# Patient Record
Sex: Female | Born: 1945
Health system: Southern US, Community
[De-identification: ages and names within clinical notes are randomized; demographics above are authoritative.]

## PROBLEM LIST (undated history)

## (undated) ENCOUNTER — Emergency Department (HOSPITAL_COMMUNITY): Admission: EM | Payer: 59

## (undated) DIAGNOSIS — A388 Scarlet fever with other complications: Secondary | ICD-10-CM

## (undated) DIAGNOSIS — C3432 Malignant neoplasm of lower lobe, left bronchus or lung: Secondary | ICD-10-CM

## (undated) DIAGNOSIS — R011 Cardiac murmur, unspecified: Secondary | ICD-10-CM

## (undated) DIAGNOSIS — I10 Essential (primary) hypertension: Secondary | ICD-10-CM

## (undated) DIAGNOSIS — F419 Anxiety disorder, unspecified: Secondary | ICD-10-CM

## (undated) HISTORY — DX: Essential (primary) hypertension: I10

## (undated) HISTORY — PX: KYPHOPLASTY: SHX5884

## (undated) HISTORY — PX: OTHER SURGICAL HISTORY: SHX169

## (undated) HISTORY — DX: Scarlet fever with other complications: A38.8

## (undated) HISTORY — DX: Anxiety disorder, unspecified: F41.9

## (undated) MED FILL — Dexamethasone Sodium Phosphate Inj 100 MG/10ML: INTRAMUSCULAR | Qty: 1 | Status: AC

---

## 1998-09-05 ENCOUNTER — Other Ambulatory Visit: Admission: RE | Admit: 1998-09-05 | Discharge: 1998-09-05 | Payer: Self-pay | Admitting: Internal Medicine

## 1999-10-23 ENCOUNTER — Other Ambulatory Visit: Admission: RE | Admit: 1999-10-23 | Discharge: 1999-10-23 | Payer: Self-pay | Admitting: Internal Medicine

## 2010-03-06 ENCOUNTER — Encounter: Payer: Self-pay | Admitting: Internal Medicine

## 2010-03-07 ENCOUNTER — Ambulatory Visit: Admit: 2010-03-07 | Payer: Self-pay | Admitting: Internal Medicine

## 2010-03-21 ENCOUNTER — Encounter: Payer: Self-pay | Admitting: Internal Medicine

## 2010-03-21 ENCOUNTER — Ambulatory Visit
Admission: RE | Admit: 2010-03-21 | Discharge: 2010-03-21 | Payer: Self-pay | Source: Home / Self Care | Attending: Internal Medicine | Admitting: Internal Medicine

## 2010-03-21 ENCOUNTER — Other Ambulatory Visit: Payer: Self-pay | Admitting: Internal Medicine

## 2010-03-21 DIAGNOSIS — R739 Hyperglycemia, unspecified: Secondary | ICD-10-CM | POA: Insufficient documentation

## 2010-03-21 DIAGNOSIS — M810 Age-related osteoporosis without current pathological fracture: Secondary | ICD-10-CM | POA: Insufficient documentation

## 2010-03-21 DIAGNOSIS — H538 Other visual disturbances: Secondary | ICD-10-CM | POA: Insufficient documentation

## 2010-03-21 DIAGNOSIS — M549 Dorsalgia, unspecified: Secondary | ICD-10-CM | POA: Insufficient documentation

## 2010-03-21 DIAGNOSIS — F411 Generalized anxiety disorder: Secondary | ICD-10-CM | POA: Insufficient documentation

## 2010-03-21 DIAGNOSIS — R011 Cardiac murmur, unspecified: Secondary | ICD-10-CM | POA: Insufficient documentation

## 2010-03-21 LAB — BASIC METABOLIC PANEL
BUN: 6 mg/dL (ref 6–23)
CO2: 29 mEq/L (ref 19–32)
Calcium: 10.1 mg/dL (ref 8.4–10.5)
Chloride: 100 mEq/L (ref 96–112)
Creatinine, Ser: 0.8 mg/dL (ref 0.4–1.2)
GFR: 75.98 mL/min (ref >60.00)
Glucose, Bld: 99 mg/dL (ref 70–99)
Potassium: 4.9 mEq/L (ref 3.5–5.1)
Sodium: 138 mEq/L (ref 135–145)

## 2010-03-21 LAB — HEMOGLOBIN A1C: Hgb A1c MFr Bld: 6 % (ref 4.6–6.5)

## 2010-03-25 ENCOUNTER — Telehealth: Payer: Self-pay | Admitting: Internal Medicine

## 2010-03-31 ENCOUNTER — Other Ambulatory Visit: Payer: Self-pay | Admitting: Internal Medicine

## 2010-03-31 ENCOUNTER — Ambulatory Visit
Admission: RE | Admit: 2010-03-31 | Discharge: 2010-03-31 | Payer: Self-pay | Source: Home / Self Care | Attending: Internal Medicine | Admitting: Internal Medicine

## 2010-03-31 LAB — TSH: TSH: 0.78 u[IU]/mL (ref 0.35–5.50)

## 2010-04-03 NOTE — Letter (Signed)
Summary: [facility]   Imported By: Sherian Rein 03/27/2010 11:34:32  _____________________________________________________________________  External Attachment:    Type:   Image     Comment:   External Document

## 2010-04-03 NOTE — Progress Notes (Signed)
Summary: req lab results  Phone Note Call from Patient Call back at Home Phone [phone]   Caller: Patient---[phone] Call For: Jacques Navy MD Summary of Call: Pt had labs Jan 20, pt anxious for results. Please advise.  Follow-up for Phone Call        PT CALLED. Bmet and A1c normal. Follow-up by: Jacques Navy MD,  March 25, 2010 5:36 PM

## 2010-04-03 NOTE — Assessment & Plan Note (Signed)
Summary: NEW PT/ SELF PAY/ BRING $184.00/NWS   Vital Signs:  Patient profile:   65 year old female Height:      61 inches Weight:      100 pounds BMI:     18.96 O2 Sat:      99 % on Room air Temp:     97.8 degrees F oral Pulse rate:   128 / minute BP sitting:   154 / 80  (left arm) Cuff size:   regular  Vitals Entered By: Bill Salinas CMA (March 21, 2010 1:38 PM)  O2 Flow:  Room air CC: New pt here to est care with primary md Comments Pt is due for mammogram and has never had a colonoscopy  Pt given rx for hydrocodon 5/325 1 q 6 as needed #30. Pharm called this med was backordered and not avail at any other locations. Gave verbal ok for 10/650 1/2 tab every 6 hours as needed pain #15 3 refills - EMR UPDATED............Marland KitchenLamar Sprinkles, CMA  March 21, 2010 4:07 PM    CC:  New pt here to est care with primary md.  History of Present Illness: Sheena [name] presents to establish for on-going continuity care.  About a week ago she started to develop severe low back pain with a pulling at the low back. the pain was so bad that she could sleep. She did go to Urgent Care - where back films read out possible compression fracture and lab revealed hematuria raising the question of kidney stone. She has a h/o osteoporosis. Since the onset of back pain she has had numbness in the left hip.  She denies dysuria, but has pressure and increased frequency.   She has a chronic sensaton of being dizzy - light-headed. This has been going on for many weeks. She has dry mouth, thirsty all the time, lips have been red and peeling. She hasn't had a doctor's vist or any lab work for a long time.   She c/o varicosity left popliteal fossa but also a feeling of a heavy, like fluid in the ankle.   Preventive Screening-Counseling & Management  Alcohol-Tobacco     Alcohol drinks/day: 0     Packs/Day: 1.0     Year Started: 1970s  Caffeine-Diet-Exercise     Caffeine use/day: 2 cups per day     Does  Patient Exercise: no  Hep-HIV-STD-Contraception     Dental Visit-last 6 months no     Sun Exposure-Excessive: no  Safety-Violence-Falls     Seat Belt Use: yes     Helmet Use: n/a     Firearms in the Home: no firearms in the home     Smoke Detectors: yes     Violence in the Home: no risk noted     Sexual Abuse: no     Fall Risk: slight fall risk      Drug Use:  never.        Blood Transfusions:  no.    Current Medications (verified): 1)  Hydrocodone-Acetaminophen 5-500 Mg Tabs (Hydrocodone-Acetaminophen) .... Take One Tablet Every 4 To 6 Hours As Needed 2)  Cyclobenzaprine Hcl 10 Mg Tabs (Cyclobenzaprine Hcl) .... Take One Tablet By Mouth Three Times A Day As Needed For Muscle Spasms  Allergies (verified): No Known Drug Allergies  Past History:  Past Medical History: UCD BLURRED VISION (ICD-368.8) OSTEOPOROSIS (ICD-733.00) MURMUR (ICD-785.2)  Past Surgical History: Tubal ligation surgical extraction of teeth   G5 P4 1 SAB  Family History: Father- deceased -  didn't know her father Mother- 39: CAD/CABG, breast cancer, GB surg, HTN Neg - DM, colon cancer, CVA  Social History: 10th grade Married '68 - 12 yrs/divorced; married '83 - 3 boys - '69, '73, '84; 1 dtr - '71: 3 grandchildren Homemaker no history of physical or sexual abuse.  Lots of stress: financial stress with poor economy - almost lost home. Youngest son lives at [place].  Packs/Day:  1.0 Caffeine use/day:  2 cups per day Does Patient Exercise:  no Dental Care w/in 6 mos.:  no Sun Exposure-Excessive:  no Seat Belt Use:  yes Fall Risk:  slight fall risk Blood Transfusions:  no Drug Use:  never  Review of Systems       The patient complains of vision loss and decreased hearing.  The patient denies anorexia, fever, weight loss, weight gain, hoarseness, chest pain, syncope, dyspnea on exertion, prolonged cough, headaches, abdominal pain, severe indigestion/heartburn, genital sores, muscle weakness,  difficulty walking, abnormal bleeding, enlarged lymph nodes, and angioedema.         blurred vision - last eye exam more than 5 years. Has tinnitis. Last pelvic/PAP 8 years ago at least. Menopause in late 30's.   Physical Exam  General:  Very thin white woman in no acute distress Head:  normocephalic, atraumatic, and no abnormalities observed.   Eyes:  vision grossly intact, pupils equal, pupils round, corneas and lenses clear, and no injection.   Ears:  External ear exam shows no significant lesions or deformities.  Otoscopic examination reveals clear canals, tympanic membranes are intact bilaterally without bulging, retraction, inflammation or discharge. Hearing is grossly normal bilaterally. Nose:  no external deformity, no external erythema, and no nasal discharge.   Mouth:  no oral lesions. Neck:  supple, no masses, no thyromegaly, and no carotid bruits.   Chest Wall:  no deformities.   Breasts:  No mass, nodules, thickening, tenderness, bulging, retraction, inflamation, nipple discharge or skin changes noted.   Lungs:  normal respiratory effort, normal breath sounds, no crackles, and no wheezes.   Heart:  normal rate, regular rhythm, no murmur, no gallop, and no JVD.   Abdomen:  soft.  BS positive. No guarding or rebound. No Heptosplenomegaly. Msk:  back exam: able to stand, flex, nl gait, able to toe/heel walk. Able to step up to exam with the left leg. Nl DTRs at patellar tendon. Nl sensation to light touch, pin-prick and deep vibratory sensation. Pulses:  2+ radial pulse. Neurologic:  alert & oriented X3 and cranial nerves II-XII intact.   Skin:  turgor normal, color normal, and no suspicious lesions.   Cervical Nodes:  no anterior cervical adenopathy and no posterior cervical adenopathy.   Axillary Nodes:  no R axillary adenopathy and no L axillary adenopathy.   Psych:  Oriented X3, memory intact for recent and remote, normally interactive, and moderately anxious.     Impression  & Recommendations:  Problem # 1:  HYPERGLYCEMIA (ICD-790.29) Patient with excessive glucose in urinalysis at urgent care with many symptoms suggestive of hyperglycemia.  Plan - metabolic panel and A1C.          no sugar low carb diet.  Orders: TLB-BMP (Basic Metabolic Panel-BMET) (80048-METABOL) TLB-A1C / Hgb A1C (Glycohemoglobin) (83036-A1C)  addendum - normal serum glucose and normal A1C  Plan - will need further evaluation: with tachycardia and dry skin , thirst will need to evaluate thyroid function, i.e. TSH  Problem # 2:  OSTEOPOROSIS (ICD-733.00) X-ray of back by report does reveal osteopenic  bones and her frame and appearance put her at high risk. She cannot afford a bone density study at this time.  PLan - start alendoronate weekly            bone density study when possible.  Her updated medication list for this problem includes:    Alendronate Sodium 70 Mg Tabs (Alendronate sodium) .Marland Kitchen... 1 by mouth weekly for osteoporosis  Problem # 3:  MURMUR (ICD-785.2) history of murmur no significant murmur on exam.   Plan - close follow-up on physical exam.  Problem # 4:  ANXIETY (ICD-300.00) Patient has been under significant stress and does appear to be anxious.   Plan - explore further at her next vist and consider low dose anxiolytic  Problem # 5:  BACK PAIN, ACUTE (ICD-724.5) Patient with acute back pain. X-ray was read as negative for compression fracture. Her exam is negative for radiculopathy thus will not pursue additional imaging studies at this time.  Plan - pain mgt with tylenol and muscle relaxant. Analgesia as needed.           back stretches and exercise           repeat exam to insure no radicular problems.  Her updated medication list for this problem includes:    Hydrocodone-acetaminophen 10-650 Mg Tabs (Hydrocodone-acetaminophen) .Marland Kitchen... 1/2 tab every 6 hours as needed pain    Cyclobenzaprine Hcl 10 Mg Tabs (Cyclobenzaprine hcl) .Marland Kitchen... Take one tablet by  mouth three times a day as needed for muscle spasms    Acetaminophen 500 Mg Caps (Acetaminophen) .Marland Kitchen... 1 three times a day for back pain  Problem # 6:  Preventive Health Care (ICD-V70.0) Patient has not had any medical care/preventive care for several years. Her exam today is stable and breast exam is normal. Renal function and electrolytes are normal. She will need to return for pelvic and  PAP smear; a mammogram will need to be done; additional routine lab work is necessary.  She is provided with contact tele # for [facility] so that she can enroll in project access in which I participate so that she can be provided care at deep discount.  In summary - a nice woman who has had back pain and symptoms suggesting a metabolic derangement. She will be contacted in regard to returning for thyroid lab. Hopefully she will enroll in project access and can afford to return to complete wellness care.  Complete Medication List: 1)  Hydrocodone-acetaminophen 10-650 Mg Tabs (Hydrocodone-acetaminophen) .... 1/2 tab every 6 hours as needed pain 2)  Cyclobenzaprine Hcl 10 Mg Tabs (Cyclobenzaprine hcl) .... Take one tablet by mouth three times a day as needed for muscle spasms 3)  Acetaminophen 500 Mg Caps (Acetaminophen) .Marland Kitchen.. 1 three times a day for back pain 4)  Alendronate Sodium 70 Mg Tabs (Alendronate sodium) .Marland Kitchen.. 1 by mouth weekly for osteoporosis  Patient: Sheena [name] Note: All result statuses are Final unless otherwise noted.  Tests: (1) BMP (METABOL)   Sodium                    138 mEq/L                   135-145   Potassium                 4.9 mEq/L                   3.5-5.1   Chloride  100 mEq/L                   96-112   Carbon Dioxide            29 mEq/L                    19-32   Glucose                   99 mg/dL                    16-10   BUN                       6 mg/dL                     9-60   Creatinine                0.8 mg/dL                   4.5-4.0   Calcium                    10.1 mg/dL                  9.8-11.9   GFR                       75.98 mL/min                >60.00  Tests: (2) Hemoglobin A1C (A1C)   Hemoglobin A1C            6.0 %                       4.6-6.5     Glycemic Control Guidelines for People with Diabetes:     Non Diabetic:  <6%  Patient Instructions: 1)  Back pain - no evidence of a pinched nerve: no need for any imaging studies. Take generic aleve (naproxen sodium) 2 tablets AM and PM.Take tyleno (generic)l 500mg  three times a day on schedule. It is ok to take the vicodin 5/500 but remember that it contains tylenol and you can not take more the 3000mg  of tylenol a day. Heat will help. Start doing some gentle stretching exercises. 2)  Diabetes- I am concerned that you have diabetes based on your symptoms and the  suguar in your urine. Plan - lab today. You need to be on a NO sugar diet and low carbs (cut down on bread, rice, potatoes). when we have a better diagnosis will talk about medication. 3)  Bone health - you have osteoporosis. Plan - generic alendronate 70mg  once a week.  4)  Health maintenance - you will need a pelvic and pap smear.  5)  Financial assistance for health care - [facility] [phone]. Call about being enrolled in project access for low to no cost care. Prescriptions: HYDROCODONE-ACETAMINOPHEN 10-650 MG TABS (HYDROCODONE-ACETAMINOPHEN) 1/2 tab every 6 hours as needed pain  #15 x 3   Entered by:   Lamar Sprinkles, CMA   Authorized by:   Jacques Navy MD   Signed by:   Lamar Sprinkles, CMA on 03/21/2010   Method used:   Telephoned to ...         RxID:   [id number] ALENDRONATE SODIUM 70 MG TABS (ALENDRONATE SODIUM) 1 by mouth weekly for  osteoporosis  #4 x 12   Entered and Authorized by:   Jacques Navy MD   Signed by:   Jacques Navy MD on 03/21/2010   Method used:   Print then Give to Patient   RxID:   [id number]    Orders Added: 1)  TLB-BMP (Basic Metabolic Panel-BMET) [80048-METABOL] 2)  TLB-A1C  / Hgb A1C (Glycohemoglobin) [83036-A1C] 3)  New Patient Level II [99202]   Immunization History:  Influenza Immunization History:    Influenza:  declined (03/21/2010)   Immunization History:  Influenza Immunization History:    Influenza:  declined (03/21/2010)

## 2010-04-07 ENCOUNTER — Encounter: Payer: Self-pay | Admitting: Internal Medicine

## 2010-04-17 NOTE — Letter (Signed)
   [facility] [address] [place], [place]  [address] Phone: [phone]      April 07, 2010   [facility] [address] [place], [place] [address]  RE:  LAB RESULTS  Dear  Ms. Arroyo,  The following is an interpretation of your most recent lab tests.  Please take note of any instructions provided or changes to medications that have resulted from your lab work.  THYROID STUDIES:  Thyroid studies normal TSH: 0.78      No laboratory explanation for your symptoms. We can discuss further evaluation at your next visit.   I hope that you have been able to sign up with [employer].    Sincerely Yours,    Jacques Navy MD

## 2010-04-22 ENCOUNTER — Telehealth: Payer: Self-pay | Admitting: Internal Medicine

## 2010-04-29 NOTE — Progress Notes (Signed)
Summary: PAIN  Phone Note Call from Patient Call back at Home Phone [phone]   Summary of Call: Patient is requesting a call back regarding her back pain. Only taking tylenol q 6 hrs and wants to talk to nurse.  Initial call taken by: Lamar Sprinkles, CMA,  April 22, 2010 12:57 PM  Follow-up for Phone Call        Spoke w/pt - she stopped aleve b/c it caused stomach pain. She is not taking hydrocodone b/c she does not like how it makes her feel. Has also had muscle relaxer in the past - unsure how it affects her b/c she was taking her at the same time has hydrocodone. She thinks the flexeril did help some w/the "pulling" feeling. Patient is requesting rx for "mild" rx at night that does not make her feel "sick" like hydrocodone. She is taking 500mg  tylenol every 6 hours. This helps some during the day but her worse pain is at night and she is having problems sleeping.  Follow-up by: Lamar Sprinkles, CMA,  April 22, 2010 6:03 PM  Additional Follow-up for Phone Call Additional follow up Details #1::        can try tramadol 50mg  1 or 2 every 8 hours as needed for pain. #60, refill x 3 Additional Follow-up by: Jacques Navy MD,  April 22, 2010 6:20 PM    Additional Follow-up for Phone Call Additional follow up Details #2::    Pt informed  Follow-up by: Lamar Sprinkles, CMA,  April 23, 2010 12:21 PM  New/Updated Medications: TRAMADOL HCL 50 MG TABS (TRAMADOL HCL) 1 or 2 every 8 hrs for pain Prescriptions: TRAMADOL HCL 50 MG TABS (TRAMADOL HCL) 1 or 2 every 8 hrs for pain  #60 x 3   Entered by:   Lamar Sprinkles, CMA   Authorized by:   Jacques Navy MD   Signed by:   Lamar Sprinkles, CMA on 04/23/2010   Method used:   Electronically to        [facility]  [address] [phone]* (retail)       [address]       [place], [place]  [address]       Ph: [phone]       Fax: [phone]   RxID:   [id number]

## 2010-10-13 ENCOUNTER — Emergency Department (HOSPITAL_COMMUNITY): Payer: Self-pay

## 2010-10-13 ENCOUNTER — Emergency Department (HOSPITAL_COMMUNITY)
Admission: EM | Admit: 2010-10-13 | Discharge: 2010-10-13 | Disposition: A | Discharge status: Home / Self Care | Payer: Self-pay | Attending: Emergency Medicine | Admitting: Emergency Medicine

## 2010-10-13 DIAGNOSIS — M549 Dorsalgia, unspecified: Secondary | ICD-10-CM | POA: Insufficient documentation

## 2010-10-13 DIAGNOSIS — H9319 Tinnitus, unspecified ear: Secondary | ICD-10-CM | POA: Insufficient documentation

## 2010-10-13 DIAGNOSIS — R5381 Other malaise: Secondary | ICD-10-CM | POA: Insufficient documentation

## 2010-10-13 DIAGNOSIS — R002 Palpitations: Secondary | ICD-10-CM | POA: Insufficient documentation

## 2010-10-13 DIAGNOSIS — R5383 Other fatigue: Secondary | ICD-10-CM | POA: Insufficient documentation

## 2010-10-13 DIAGNOSIS — I498 Other specified cardiac arrhythmias: Secondary | ICD-10-CM | POA: Insufficient documentation

## 2010-10-13 DIAGNOSIS — R42 Dizziness and giddiness: Secondary | ICD-10-CM | POA: Insufficient documentation

## 2010-10-13 LAB — PROTIME-INR: Prothrombin Time: 13 seconds (ref 11.6–15.2)

## 2010-10-13 LAB — URINALYSIS, ROUTINE W REFLEX MICROSCOPIC
Bilirubin Urine: NEGATIVE
Glucose, UA: NEGATIVE mg/dL
Ketones, ur: NEGATIVE mg/dL
Nitrite: NEGATIVE
Specific Gravity, Urine: 1.003 — ABNORMAL LOW (ref 1.005–1.030)
pH: 7 (ref 5.0–8.0)

## 2010-10-13 LAB — DIFFERENTIAL
Basophils Absolute: 0.1 10*3/uL (ref 0.0–0.1)
Eosinophils Relative: 0 % (ref 0–5)
Lymphocytes Relative: 15 % (ref 12–46)
Lymphs Abs: 1.9 10*3/uL (ref 0.7–4.0)
Neutrophils Relative %: 79 % — ABNORMAL HIGH (ref 43–77)

## 2010-10-13 LAB — URINE MICROSCOPIC-ADD ON

## 2010-10-13 LAB — CBC
HCT: 36.7 % (ref 36.0–46.0)
MCV: 96.1 fL (ref 78.0–100.0)
Platelets: 378 10*3/uL (ref 150–400)
RBC: 3.82 MIL/uL — ABNORMAL LOW (ref 3.87–5.11)
RDW: 13.6 % (ref 11.5–15.5)
WBC: 12.1 10*3/uL — ABNORMAL HIGH (ref 4.0–10.5)

## 2010-10-13 LAB — POCT I-STAT, CHEM 8
BUN: 4 mg/dL — ABNORMAL LOW (ref 6–23)
Chloride: 107 mEq/L (ref 96–112)
HCT: 40 % (ref 36.0–46.0)
Potassium: 4.6 mEq/L (ref 3.5–5.1)

## 2010-10-13 LAB — COMPREHENSIVE METABOLIC PANEL
ALT: 7 U/L (ref 0–35)
AST: 15 U/L (ref 0–37)
Albumin: 3.9 g/dL (ref 3.5–5.2)
Calcium: 9.1 mg/dL (ref 8.4–10.5)
Creatinine, Ser: 0.58 mg/dL (ref 0.50–1.10)
Sodium: 143 mEq/L (ref 135–145)
Total Protein: 7 g/dL (ref 6.0–8.3)

## 2010-10-13 LAB — POCT I-STAT TROPONIN I: Troponin i, poc: 0.03 ng/mL (ref 0.00–0.08)

## 2010-10-24 ENCOUNTER — Telehealth: Payer: Self-pay | Admitting: *Deleted

## 2010-10-24 NOTE — Telephone Encounter (Signed)
Patient informed. 

## 2010-10-24 NOTE — Telephone Encounter (Signed)
Seen as a new patient in January. For cold symptms: APAP, nasal saline, may want to dry low dose sudafed at 30mg  to see if it helps w/o too much side effect. Alternatively she can use afrin nasal spray.  For worse illness, i.e. Need for antibiotics, can refer to Saturday clinic

## 2010-10-24 NOTE — Telephone Encounter (Signed)
Pt c/o nausea, sinus congestion/pressure & some drainage, productive cough w/green mucus. She is req RX for antibiotic. Per pt, she can not take antihistamines or sudafed b/c it causes "leg to jump" and heart racing.

## 2011-03-24 ENCOUNTER — Telehealth: Payer: Self-pay | Admitting: Internal Medicine

## 2011-03-24 NOTE — Telephone Encounter (Signed)
This patient called and is hoping to get an appt with you, however, she has no insurance and no money.  She states she will not be able to pay any of the 125.00 no insurance copay.  She is having headaches and has been dizzy for two months.  She also wants to discuss how she can get on disability.  Do you want me to go ahead and make her an appt?    Thanks!

## 2011-03-24 NOTE — Telephone Encounter (Signed)
Seen 1 year ago. She was told to enroll in project access/[facility] [phone] to enroll. They will say they have no room for more patients at [facility] but I am a participating physician.  She may be scheduled for an appointment

## 2011-03-28 ENCOUNTER — Emergency Department (HOSPITAL_COMMUNITY): Payer: Self-pay

## 2011-03-28 ENCOUNTER — Emergency Department (HOSPITAL_COMMUNITY): Admission: EM | Admit: 2011-03-28 | Discharge: 2011-03-28 | Payer: Self-pay | Source: Home / Self Care

## 2011-03-28 ENCOUNTER — Encounter (HOSPITAL_COMMUNITY): Payer: Self-pay | Admitting: *Deleted

## 2011-03-28 ENCOUNTER — Emergency Department (HOSPITAL_COMMUNITY)
Admission: EM | Admit: 2011-03-28 | Discharge: 2011-03-28 | Disposition: A | Discharge status: Home / Self Care | Payer: Self-pay | Attending: Emergency Medicine | Admitting: Emergency Medicine

## 2011-03-28 DIAGNOSIS — R630 Anorexia: Secondary | ICD-10-CM | POA: Insufficient documentation

## 2011-03-28 DIAGNOSIS — R5383 Other fatigue: Secondary | ICD-10-CM | POA: Insufficient documentation

## 2011-03-28 DIAGNOSIS — J329 Chronic sinusitis, unspecified: Secondary | ICD-10-CM

## 2011-03-28 DIAGNOSIS — R51 Headache: Secondary | ICD-10-CM | POA: Insufficient documentation

## 2011-03-28 DIAGNOSIS — R5381 Other malaise: Secondary | ICD-10-CM | POA: Insufficient documentation

## 2011-03-28 DIAGNOSIS — I10 Essential (primary) hypertension: Secondary | ICD-10-CM

## 2011-03-28 DIAGNOSIS — R42 Dizziness and giddiness: Secondary | ICD-10-CM | POA: Insufficient documentation

## 2011-03-28 HISTORY — DX: Cardiac murmur, unspecified: R01.1

## 2011-03-28 LAB — POCT I-STAT, CHEM 8
BUN: <3 mg/dL — ABNORMAL LOW (ref 6–23)
Chloride: 99 mEq/L (ref 96–112)
Creatinine, Ser: 0.6 mg/dL (ref 0.50–1.10)
Glucose, Bld: 112 mg/dL — ABNORMAL HIGH (ref 70–99)
HCT: 41 % (ref 36.0–46.0)
Potassium: 4.5 mEq/L (ref 3.5–5.1)

## 2011-03-28 MED ORDER — CEPHALEXIN 500 MG PO CAPS: 500.0000 mg | ORAL_CAPSULE | Freq: Four times a day (QID) | ORAL | Status: DC

## 2011-03-28 MED ORDER — METOPROLOL TARTRATE 50 MG PO TABS: 50.0000 mg | ORAL_TABLET | Freq: Two times a day (BID) | ORAL | Status: DC

## 2011-03-28 MED ORDER — METOPROLOL SUCCINATE ER 25 MG PO TB24: 25.0000 mg | ORAL_TABLET | Freq: Every day | ORAL | Status: DC

## 2011-03-28 MED ORDER — TRAMADOL HCL 50 MG PO TABS: 50.0000 mg | ORAL_TABLET | Freq: Four times a day (QID) | ORAL | Status: DC | PRN

## 2011-03-28 MED ORDER — AMOXICILLIN-POT CLAVULANATE 500-125 MG PO TABS: 1.0000 | ORAL_TABLET | Freq: Three times a day (TID) | ORAL | Status: AC

## 2011-03-28 NOTE — ED Notes (Signed)
Patient with c/o dizziness and headache for about a year and today her symptoms were worse.  Patient is now off balance when she stands up.  Patient states that when she gets the headache it hurts so bad that she feels like she is going to fall.  Patient feels weak all over.  Patient also complain of loss of appetite.

## 2011-03-28 NOTE — ED Provider Notes (Addendum)
History     CSN: [id number]  Arrival date & time 03/28/11  1624   First MD Initiated Contact with Patient 03/28/11 1749      Chief Complaint  Patient presents with  . Dizziness  . Headache     HPI Patient with c/o dizziness and headache for about a year and today her symptoms were worse. Patient is now off balance when she stands up. Patient states that when she gets the headache it hurts so bad that she feels like she is going to fall. Patient feels weak all over. Patient also complain of loss of appetite.   Past Medical History  Diagnosis Date  . Heart murmur     History reviewed. No pertinent past surgical history.  History reviewed. No pertinent family history.  History  Substance Use Topics  . Smoking status: Never Smoker   . Smokeless tobacco: Not on file  . Alcohol Use: No    OB History    Grav Para Term Preterm Abortions TAB SAB Ect Mult Living                  Review of Systems Negative except as noted in history of present illness Allergies  Aspirin  Home Medications   Current Outpatient Rx  Name Route Sig Dispense Refill  . ACETAMINOPHEN 500 MG PO TABS Oral Take 500 mg by mouth 2 (two) times daily as needed. For pain    . AMOXICILLIN-POT CLAVULANATE 500-125 MG PO TABS Oral Take 1 tablet (500 mg total) by mouth every 8 (eight) hours. 21 tablet 0  . CEPHALEXIN 500 MG PO CAPS Oral Take 1 capsule (500 mg total) by mouth 4 (four) times daily. 30 capsule 0  . METOPROLOL TARTRATE 50 MG PO TABS Oral Take 1 tablet (50 mg total) by mouth 2 (two) times daily. 60 tablet 2  . TRAMADOL HCL 50 MG PO TABS Oral Take 1 tablet (50 mg total) by mouth every 6 (six) hours as needed for pain. 15 tablet 0    BP 171/78  Pulse 102  Temp(Src) 98.1 F (36.7 C) (Oral)  Resp 20  SpO2 98%  Physical Exam  Nursing note and vitals reviewed. Constitutional: She is oriented to person, place, and time. She appears well-developed and well-nourished. No distress.  HENT:  Head:  Normocephalic and atraumatic.  Eyes: Pupils are equal, round, and reactive to light.  Neck: Normal range of motion.  Cardiovascular: Normal rate and intact distal pulses.   Pulmonary/Chest: No respiratory distress. She has no wheezes.  Abdominal: Normal appearance. She exhibits no distension. There is no tenderness.  Musculoskeletal: Normal range of motion.  Neurological: She is alert and oriented to person, place, and time. She has normal strength. No cranial nerve deficit or sensory deficit. She displays a negative Romberg sign. GCS eye subscore is 4. GCS verbal subscore is 5. GCS motor subscore is 6.  Skin: Skin is warm and dry. No rash noted.  Psychiatric: She has a normal mood and affect. Her behavior is normal.    ED Course  Procedures (including critical care time)  Labs Reviewed  POCT I-STAT, CHEM 8 - Abnormal; Notable for the following:    BUN <3 (*)    Glucose, Bld 112 (*)    All other components within normal limits  LAB REPORT - SCANNED   No results found.   1. Sinusitis   2. Hypertension       MDM  Nelia Shi, MD 03/30/11 2320  Nelia Shi, MD 03/30/11 [phone]

## 2011-03-28 NOTE — ED Notes (Addendum)
Asked by registration staff to evaluate patient in lobby.  Patient has multiple complaints.  Pts greatest concern is her sinus congestion and dizziness.  Patient states this has been going on for a year and she has not been able to afford to go to the ENT.  Patient shaking and states she is very weak.  Patient's husband with her.  Wheelchair obtained for patient.  Patient made aware of current wait time and that we did not have any rooms in which she could lay down.  Patient stated her head hurt too bad to have to sit in waiting room.  Patient and husband decided not to stay and be seen at [facility].

## 2011-04-01 ENCOUNTER — Ambulatory Visit (INDEPENDENT_AMBULATORY_CARE_PROVIDER_SITE_OTHER): Payer: Self-pay | Admitting: Internal Medicine

## 2011-04-01 ENCOUNTER — Ambulatory Visit: Payer: Self-pay | Admitting: Internal Medicine

## 2011-04-01 ENCOUNTER — Encounter: Payer: Self-pay | Admitting: Internal Medicine

## 2011-04-01 ENCOUNTER — Ambulatory Visit (INDEPENDENT_AMBULATORY_CARE_PROVIDER_SITE_OTHER)
Admission: RE | Admit: 2011-04-01 | Discharge: 2011-04-01 | Disposition: A | Discharge status: Home / Self Care | Payer: Self-pay | Source: Ambulatory Visit | Attending: Internal Medicine | Admitting: Internal Medicine

## 2011-04-01 ENCOUNTER — Other Ambulatory Visit (INDEPENDENT_AMBULATORY_CARE_PROVIDER_SITE_OTHER): Payer: Self-pay

## 2011-04-01 VITALS — BP 140/80 | HR 62 | Temp 97.6°F | Resp 16

## 2011-04-01 DIAGNOSIS — R05 Cough: Secondary | ICD-10-CM

## 2011-04-01 DIAGNOSIS — R7309 Other abnormal glucose: Secondary | ICD-10-CM

## 2011-04-01 DIAGNOSIS — J411 Mucopurulent chronic bronchitis: Secondary | ICD-10-CM | POA: Insufficient documentation

## 2011-04-01 DIAGNOSIS — R270 Ataxia, unspecified: Secondary | ICD-10-CM | POA: Insufficient documentation

## 2011-04-01 DIAGNOSIS — H538 Other visual disturbances: Secondary | ICD-10-CM

## 2011-04-01 DIAGNOSIS — R279 Unspecified lack of coordination: Secondary | ICD-10-CM

## 2011-04-01 DIAGNOSIS — F419 Anxiety disorder, unspecified: Secondary | ICD-10-CM

## 2011-04-01 DIAGNOSIS — F411 Generalized anxiety disorder: Secondary | ICD-10-CM

## 2011-04-01 LAB — CBC WITH DIFFERENTIAL/PLATELET
Basophils Absolute: 0.1 10*3/uL (ref 0.0–0.1)
HCT: 39.8 % (ref 36.0–46.0)
Hemoglobin: 13.6 g/dL (ref 12.0–15.0)
Lymphs Abs: 1.8 10*3/uL (ref 0.7–4.0)
MCHC: 34.1 g/dL (ref 30.0–36.0)
MCV: 96.5 fl (ref 78.0–100.0)
Monocytes Absolute: 0.5 10*3/uL (ref 0.1–1.0)
Monocytes Relative: 4.4 % (ref 3.0–12.0)
Neutro Abs: 8.8 10*3/uL — ABNORMAL HIGH (ref 1.4–7.7)
RDW: 13.2 % (ref 11.5–14.6)

## 2011-04-01 LAB — COMPREHENSIVE METABOLIC PANEL
AST: 18 U/L (ref 0–37)
BUN: 6 mg/dL (ref 6–23)
Calcium: 9.7 mg/dL (ref 8.4–10.5)
Chloride: 101 mEq/L (ref 96–112)
Creatinine, Ser: 0.7 mg/dL (ref 0.4–1.2)
GFR: 92.67 mL/min (ref >60.00)

## 2011-04-01 LAB — VITAMIN B12: Vitamin B-12: 322 pg/mL (ref 211–911)

## 2011-04-01 LAB — FOLATE: Folate: 6.9 ng/mL (ref >5.9)

## 2011-04-01 MED ORDER — DIAZEPAM 5 MG PO TABS: 5.0000 mg | ORAL_TABLET | Freq: Three times a day (TID) | ORAL | Status: AC | PRN

## 2011-04-01 NOTE — Assessment & Plan Note (Signed)
She will try valium for symptom relief

## 2011-04-01 NOTE — Progress Notes (Signed)
Subjective:    Patient ID: Sheena [name], female    DOB: 12-16-1945, 66 y.o.   MRN: [id number]  Cough This is a chronic problem. The current episode started more than 1 year ago. The problem has been gradually worsening. The problem occurs every few hours. The cough is productive of purulent sputum. Pertinent negatives include no chest pain, chills, ear congestion, ear pain, eye redness, headaches, heartburn, hemoptysis, myalgias, nasal congestion, postnasal drip, rash, rhinorrhea, sore throat, shortness of breath, sweats, weight loss or wheezing. Risk factors for lung disease include smoking/tobacco exposure. She has tried nothing for the symptoms.  Also, she complains of worsening ataxia, dizziness, diffuse weakness, and vertigo for about 6 months.    Review of Systems  Constitutional: Negative for chills, weight loss, diaphoresis, activity change, appetite change and fatigue.  HENT: Positive for tinnitus. Negative for hearing loss, ear pain, nosebleeds, congestion, sore throat, facial swelling, rhinorrhea, sneezing, trouble swallowing, neck pain, neck stiffness, voice change, postnasal drip and sinus pressure.   Eyes: Positive for visual disturbance (blurred vision). Negative for photophobia, pain, discharge, redness and itching.  Respiratory: Positive for cough. Negative for hemoptysis, choking, chest tightness, shortness of breath and wheezing.   Cardiovascular: Negative for chest pain, palpitations and leg swelling.  Gastrointestinal: Negative for heartburn, nausea, vomiting, abdominal pain, diarrhea, constipation, blood in stool, abdominal distention, anal bleeding and rectal pain.  Musculoskeletal: Negative for myalgias, back pain, joint swelling, arthralgias and gait problem.  Skin: Negative for color change, pallor, rash and wound.  Neurological: Positive for dizziness, weakness ("all over") and light-headedness. Negative for tremors, seizures, syncope, facial asymmetry, speech  difficulty, numbness and headaches.  Hematological: Negative for adenopathy. Does not bruise/bleed easily.  Psychiatric/Behavioral: Negative for suicidal ideas, hallucinations, confusion, sleep disturbance, self-injury, dysphoric mood, decreased concentration and agitation. The patient is nervous/anxious. The patient is not hyperactive.        Objective:   Physical Exam  Vitals reviewed. Constitutional: She is oriented to person, place, and time. She appears well-developed and well-nourished. No distress.  HENT:  Head: Normocephalic and atraumatic.  Mouth/Throat: Oropharynx is clear and moist. No oropharyngeal exudate.  Eyes: Conjunctivae and EOM are normal. Pupils are equal, round, and reactive to light. Right eye exhibits no discharge. Left eye exhibits no discharge. No scleral icterus.  Neck: Normal range of motion. Neck supple. No JVD present. No tracheal deviation present. No thyromegaly present.  Cardiovascular: Normal rate, regular rhythm and intact distal pulses.  Exam reveals no gallop and no friction rub.   No murmur heard. Pulmonary/Chest: Effort normal and breath sounds normal. No stridor. No respiratory distress. She has no wheezes. She has no rales. She exhibits no tenderness.  Abdominal: Soft. Bowel sounds are normal. She exhibits no distension and no mass. There is no tenderness. There is no rebound and no guarding.  Musculoskeletal: Normal range of motion. She exhibits no edema and no tenderness.  Lymphadenopathy:    She has no cervical adenopathy.  Neurological: She is alert and oriented to person, place, and time. She has normal strength. She displays abnormal reflex. She displays no atrophy and no tremor. No cranial nerve deficit or sensory deficit. She exhibits normal muscle tone. She displays no seizure activity. Coordination and gait abnormal. She displays no Babinski's sign on the right side. She displays no Babinski's sign on the left side.  Reflex Scores:      Tricep  reflexes are 3+ on the right side and 3+ on the left side.  Bicep reflexes are 3+ on the right side and 3+ on the left side.      Brachioradialis reflexes are 3+ on the right side and 3+ on the left side.      Patellar reflexes are 3+ on the right side and 3+ on the left side.      Achilles reflexes are 2+ on the right side and 2+ on the left side.      Ataxic with an abn rhomberg  Skin: Skin is warm and dry. No rash noted. She is not diaphoretic. No erythema. No pallor.  Psychiatric: Her speech is normal and behavior is normal. Judgment and thought content normal. Her mood appears anxious. Her affect is not angry, not blunt, not labile and not inappropriate. Cognition and memory are normal. She does not exhibit a depressed mood.      Lab Results  Component Value Date   WBC 12.1* 10/13/2010   HGB 13.9 03/28/2011   HCT 41.0 03/28/2011   PLT 378 10/13/2010   GLUCOSE 112* 03/28/2011   ALT 7 10/13/2010   AST 15 10/13/2010   NA 135 03/28/2011   K 4.5 03/28/2011   CL 99 03/28/2011   CREATININE 0.60 03/28/2011   BUN <3* 03/28/2011   CO2 25 10/13/2010   TSH 0.78 03/31/2010   INR 0.96 10/13/2010   HGBA1C 6.0 03/21/2010      Assessment & Plan:

## 2011-04-01 NOTE — Patient Instructions (Signed)
Ataxia You have an unsteady walk called ataxia. Your condition may require further tests. Ataxia can be caused by:  Neurological conditions.   Infections.   Physical exhaustion.   Internal bleeding.   Alcohol intoxication, or medication side effects.   Problems with circulation, blood pressure, and heart disease can also make you unsteady.  Laboratory and X-ray tests may be needed.  Treatment for now:  Get plenty of rest and eat a nutritious diet over the next weeks.   Avoid alcohol.   If you become very unsteady, dizzy, nauseated, or feel like you are going to faint, lie down flat right away.   Wait until all your symptoms pass before you get up again.  SEEK IMMEDIATE MEDICAL CARE IF:  You develop severe unsteadiness, headache, chest pain, or abdominal pain.   You have weakness or numbness on one side of your body.   You have problems with your vision.   You develop confusion or difficulty speaking.   You have a fever, chills, or an irregular heartbeat or a very fast pulse.  MAKE SURE YOU:   Understand these instructions.   Will watch your condition.   Will get help right away if you are not doing well or get worse.  Document Released: 02/16/2005 Document Revised: 10/29/2010 Document Reviewed: 08/05/2006 [facility] Patient Information 2012 [place], [place].

## 2011-04-01 NOTE — Assessment & Plan Note (Signed)
I will check labs to look for secondary causes, she needs an MRI of the brain done, will see if valium helps with the vertigo

## 2011-04-01 NOTE — Assessment & Plan Note (Signed)
I will check her CXR to look for emphysema, pna, edema, mass

## 2011-04-01 NOTE — Assessment & Plan Note (Signed)
She will continue taking the augmentin that was recently prescribed in the ER

## 2011-04-01 NOTE — Assessment & Plan Note (Signed)
She needs to have an MRI done to look for CNS lesions, possibly MS

## 2011-04-01 NOTE — Assessment & Plan Note (Signed)
Will check her a1c today

## 2011-04-06 ENCOUNTER — Ambulatory Visit (HOSPITAL_COMMUNITY)
Admission: RE | Admit: 2011-04-06 | Discharge: 2011-04-06 | Disposition: A | Discharge status: Home / Self Care | Payer: Self-pay | Source: Ambulatory Visit | Attending: Internal Medicine | Admitting: Internal Medicine

## 2011-04-06 DIAGNOSIS — H538 Other visual disturbances: Secondary | ICD-10-CM | POA: Insufficient documentation

## 2011-04-06 DIAGNOSIS — R55 Syncope and collapse: Secondary | ICD-10-CM | POA: Insufficient documentation

## 2011-04-06 DIAGNOSIS — J018 Other acute sinusitis: Secondary | ICD-10-CM | POA: Insufficient documentation

## 2011-04-06 DIAGNOSIS — R05 Cough: Secondary | ICD-10-CM

## 2011-04-06 DIAGNOSIS — R270 Ataxia, unspecified: Secondary | ICD-10-CM

## 2011-04-06 DIAGNOSIS — R7309 Other abnormal glucose: Secondary | ICD-10-CM

## 2011-04-07 ENCOUNTER — Encounter: Payer: Self-pay | Admitting: Internal Medicine

## 2011-04-15 ENCOUNTER — Telehealth: Payer: Self-pay | Admitting: *Deleted

## 2011-04-15 NOTE — Telephone Encounter (Signed)
Reviewed ED notes, ov NOTE, lab, CXR and MRI: appears to be a sinus inflammatory process.  For continued problems with balance will refer to Dr. Modesto Charon for neurology after she has a follow-up office visit with Dr. Debby Bud

## 2011-04-15 NOTE — Telephone Encounter (Signed)
Pt states that she is still having sxs of dizziness, weakness, loss of balance and sometimes have trouble focusing her eyes that she was seen for 2 weeks ago. Pt informed that MRI results were negative by Dr. Yetta Barre. Pt is asking for MD's advisement regarding sxs.

## 2011-04-16 NOTE — Telephone Encounter (Signed)
Pt informed of MD's advisement. (Pt states she will callback next week for OV) She wants to know if there is something else that she can take for her sinuses because they are still bothering her.

## 2011-04-16 NOTE — Telephone Encounter (Signed)
For sinus pressure and discomfort she can try sudafed 30 mg 2 or 3 times a day. Nasal saline wash is helpful. Stove top vaporizer is helpful.

## 2011-06-22 ENCOUNTER — Telehealth: Payer: Self-pay

## 2011-06-22 MED ORDER — METOPROLOL TARTRATE 50 MG PO TABS: 50.0000 mg | ORAL_TABLET | Freq: Two times a day (BID) | ORAL | Status: DC

## 2011-06-22 NOTE — Telephone Encounter (Signed)
Patient called request BP refill

## 2012-06-15 ENCOUNTER — Other Ambulatory Visit: Payer: Self-pay

## 2012-06-15 MED ORDER — METOPROLOL TARTRATE 50 MG PO TABS: 50.0000 mg | ORAL_TABLET | Freq: Two times a day (BID) | ORAL | Status: DC

## 2012-06-15 NOTE — Telephone Encounter (Signed)
Pt called stating that she currently is without insurance and Medicare coverage will not become effective until July. Pt requests refills of BP medications until she can make an appointment. Refills sent, pt transferred to schedule CPE.

## 2012-08-31 ENCOUNTER — Encounter: Payer: Self-pay | Admitting: Internal Medicine

## 2012-08-31 ENCOUNTER — Ambulatory Visit (INDEPENDENT_AMBULATORY_CARE_PROVIDER_SITE_OTHER): Payer: Medicare Other | Admitting: Internal Medicine

## 2012-08-31 VITALS — BP 170/78 | HR 65 | Temp 97.9°F | Ht 60.0 in | Wt 102.4 lb

## 2012-08-31 DIAGNOSIS — J329 Chronic sinusitis, unspecified: Secondary | ICD-10-CM | POA: Diagnosis not present

## 2012-08-31 DIAGNOSIS — R270 Ataxia, unspecified: Secondary | ICD-10-CM

## 2012-08-31 DIAGNOSIS — F411 Generalized anxiety disorder: Secondary | ICD-10-CM

## 2012-08-31 DIAGNOSIS — F419 Anxiety disorder, unspecified: Secondary | ICD-10-CM

## 2012-08-31 DIAGNOSIS — I10 Essential (primary) hypertension: Secondary | ICD-10-CM

## 2012-08-31 DIAGNOSIS — R279 Unspecified lack of coordination: Secondary | ICD-10-CM | POA: Diagnosis not present

## 2012-08-31 MED ORDER — METOPROLOL TARTRATE 50 MG PO TABS: 50.0000 mg | ORAL_TABLET | Freq: Two times a day (BID) | ORAL | Status: DC

## 2012-08-31 MED ORDER — AMOXICILLIN-POT CLAVULANATE 875-125 MG PO TABS: 1.0000 | ORAL_TABLET | Freq: Two times a day (BID) | ORAL | Status: DC

## 2012-08-31 MED ORDER — FUROSEMIDE 20 MG PO TABS: 20.0000 mg | ORAL_TABLET | Freq: Every day | ORAL | Status: DC

## 2012-08-31 NOTE — Progress Notes (Signed)
Subjective:    Patient ID: Sheena [name], female    DOB: 06-29-1945, 67 y.o.   MRN: [id number]  HPI Mrs. Lebeck has not been seen for over 2 years. She reports that she has been ill the entire time. Chart reviewed: CT head Aug '12 - normal brain. CT Jan 26/'13 at ED normal brain but chronic ethmoid sinusitis. Saw Dr. Yetta Barre in follow up Jan 30, '13 for persistent symptoms. F/U MRI head with normal brain but chronic sinusitis.  She reports that she continues to have pressure in her head, dysequilibrium that is so bad she has not been leaving the home. She feels very stressed. She has been taking her BP medication. She was given a trial of meclizine which she said did not help. Dr. Yetta Barre tried Valium - she could not tolerate this.   Past Medical History  Diagnosis Date  . Heart murmur   . Hypertension   . Anxiety   . Scarlet fever with other complications childhood    "had to learn to work again" has had leg weakness   Past Surgical History  Procedure Laterality Date  . Peridontal surgery     Family History  Problem Relation Age of Onset  . Cancer Mother     breast, spine mets  . Hypertension Mother   . Heart disease Mother     CABG 4 vessel  . COPD Father     emphysema  . Heart disease Brother     MI   History   Social History  . Marital Status: Married    Spouse Name: N/A    Number of Children: 4  . Years of Education: 10   Occupational History  . disability    Social History Main Topics  . Smoking status: Current Every Day Smoker -- 1.00 packs/day for 45 years    Types: Cigarettes  . Smokeless tobacco: Never Used  . Alcohol Use: No  . Drug Use: No  . Sexually Active: Not Currently   Other Topics Concern  . Not on file   Social History Narrative   10th grade. Quit school to work and help raise nieces. Married - '68 -2yr/divorced; remarried '82 - . 3 son '69, '73, '84; 1 dtr '71. 3 grandchildren. 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 Lives with husband and   And 1 son lives at home. No history of abuse.        Review of Systems System review is negative for any constitutional, cardiac, pulmonary, GI or neuro symptoms or complaints other than as described in the HPI.      Objective:   Physical Exam Filed Vitals:   08/31/12 1317  BP: 170/78  Pulse: 65  Temp: 97.9 F (36.6 C)   BP Readings from Last 3 Encounters:  08/31/12 170/78  04/01/11 140/80  03/28/11 171/78   Wt Readings from Last 3 Encounters:  08/31/12 102 lb 6.4 oz (46.448 kg)  03/21/10 100 lb (45.36 kg)   Gen'l- very slender white woman in no distress HEENT - Cerumen impaction on the left - after irrigation - TM is normal Neck - supple, w/o thyromegaly Nodes - negative Cor- 2+ radial pulse, RRR, no JVD Pulm - normal exam w/o rales or wheezes Neuro - A&O x 3, CN II-XII normal facial symmetry, normal movement, EOMI, PERRLA, No deviation tongue and no fasciculations. MS. 5/5 and equal, 2+ DTRs and equal. Cerebellar - able to stand at station, negative Rhomberg, normal tandem gait, no finger to nose, normal  rapid alternating finger movement, normal heel-shin maneuver. Hearing tested with 126 and 256 Hz forks - normal.        Assessment & Plan:  Cerumen impaction - ear washed out easily. Ear drum looks normal

## 2012-08-31 NOTE — Patient Instructions (Addendum)
1. Balance issues - normal brain imaging in '13: two CT brain and 1 MRI brain - normal. Normal exam today. No evidence on exam or imaging of brain tumor, stroke or injury. The balance may be an inner ear problem. You can try over the counter Bonine (meclizine) to see if it help. For lack of improvement we can consider Neuro-rehabilitation for balance.  2. Sinus infection - the MRI and CT reveal chronic sinus changes in the deep ethmoid sinus. This can contribute to dizzines and drainage. Plan Augmentin 875 mg twice a day for 14 days.  3. Cerumen impaction - ear washed out easily. Ear drum looks normal  4. Blood pressure - running too high. Plan Continue metoprolol twice a day  Add furosemide (lasix) low dose 20 mg once a day to help bring the pressure down.  Please return for a more complete exam and to see how you do with treatment.

## 2012-09-03 DIAGNOSIS — J329 Chronic sinusitis, unspecified: Secondary | ICD-10-CM | POA: Insufficient documentation

## 2012-09-03 DIAGNOSIS — I10 Essential (primary) hypertension: Secondary | ICD-10-CM | POA: Insufficient documentation

## 2012-09-03 NOTE — Assessment & Plan Note (Signed)
Balance issues - normal brain imaging in '12 & '13: 1  CT brain and 1 MRI brain - normal. Normal exam today. No evidence on exam or imaging of brain tumor, stroke or injury. The balance may be an inner ear problem.   Plan 1.  try over the counter Bonine (meclizine) to see if it help.   2. For lack of improvement we can consider Neuro-rehabilitation for balance.

## 2012-09-03 NOTE — Assessment & Plan Note (Signed)
Blood pressure - running too high. BP Readings from Last 3 Encounters:  08/31/12 170/78  04/01/11 140/80  03/28/11 171/78     Plan Continue metoprolol twice a day  Add furosemide (lasix) low dose 20 mg once a day to help bring the pressure down.

## 2012-09-03 NOTE — Assessment & Plan Note (Signed)
Anxiety was previously a problem and may play a role in her current situation and with her current complaints. Did not tolerate Diazepam.  Plan For continued issues will recommend counseling evaluation.

## 2012-09-03 NOTE — Assessment & Plan Note (Signed)
Ataxia and head pain may be related to chronic untreated sinusitis. She does admit to post-nasal drainage and pressure in the sinus regions.  Plan Augmentin 875 mg twice a day for 14 days.

## 2012-09-26 ENCOUNTER — Encounter: Payer: Self-pay | Admitting: Internal Medicine

## 2012-09-26 ENCOUNTER — Ambulatory Visit (INDEPENDENT_AMBULATORY_CARE_PROVIDER_SITE_OTHER): Payer: Medicare Other | Admitting: Internal Medicine

## 2012-09-26 VITALS — BP 148/80 | HR 57 | Temp 97.7°F | Wt 101.4 lb

## 2012-09-26 DIAGNOSIS — J329 Chronic sinusitis, unspecified: Secondary | ICD-10-CM

## 2012-09-26 DIAGNOSIS — I10 Essential (primary) hypertension: Secondary | ICD-10-CM | POA: Diagnosis not present

## 2012-09-26 DIAGNOSIS — R011 Cardiac murmur, unspecified: Secondary | ICD-10-CM | POA: Diagnosis not present

## 2012-09-26 MED ORDER — FUROSEMIDE 40 MG PO TABS: 40.0000 mg | ORAL_TABLET | Freq: Every day | ORAL | Status: DC

## 2012-09-26 NOTE — Assessment & Plan Note (Signed)
Continued symptoms despite 14 days of augmentin.  Plan Refer to ENT

## 2012-09-26 NOTE — Assessment & Plan Note (Signed)
BP suboptimally controlled with SBP 148.  Plan Increase furosemide to 40 mg daily

## 2012-09-26 NOTE — Progress Notes (Signed)
  Subjective:    Patient ID: Sheena [name], female    DOB: 1945/03/21, 67 y.o.   MRN: [id number]  HPI Returns for continued sinus pressure, drainage and ataxia after completing 14 days of Augmentin. She has no active signs or symptoms of infection.  Reports a pronounced heart beat which is most notable when she is laying down at night. She also reports being diagnosed with a heart murmur in the past. She attributes this to being given Darvon 30 years ago.  Past Medical History  Diagnosis Date  . Heart murmur   . Hypertension   . Anxiety   . Scarlet fever with other complications childhood    "had to learn to work again" has had leg weakness   Past Surgical History  Procedure Laterality Date  . Peridontal surgery     Family History  Problem Relation Age of Onset  . Cancer Mother     breast, spine mets  . Hypertension Mother   . Heart disease Mother     CABG 4 vessel  . COPD Father     emphysema  . Heart disease Brother     MI   History   Social History  . Marital Status: Married    Spouse Name: N/A    Number of Children: 4  . Years of Education: 10   Occupational History  . disability    Social History Main Topics  . Smoking status: Current Every Day Smoker -- 1.00 packs/day for 45 years    Types: Cigarettes  . Smokeless tobacco: Never Used  . Alcohol Use: No  . Drug Use: No  . Sexually Active: Not Currently   Other Topics Concern  . Not on file   Social History Narrative   10th grade. Quit school to work and help raise nieces. Married - '68 -52yr/divorced; remarried '82 - . 3 son '69, '73, '84; 1 dtr '71. 3 grandchildren. 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 No history of abuse.     Current Outpatient Prescriptions on File Prior to Visit  Medication Sig Dispense Refill  . acetaminophen (TYLENOL) 500 MG tablet Take 500 mg by mouth 2 (two) times daily as needed. For pain      . metoprolol (LOPRESSOR) 50 MG  tablet Take 1 tablet (50 mg total) by mouth 2 (two) times daily.  60 tablet  5   No current facility-administered medications on file prior to visit.    Review of Systems     Objective:   Physical Exam Filed Vitals:   09/26/12 1314  BP: 148/80  Pulse: 57  Temp: 97.7 F (36.5 C)  \ Wt Readings from Last 3 Encounters:  09/26/12 101 lb 6.4 oz (45.995 kg)  08/31/12 102 lb 6.4 oz (46.448 kg)  03/21/10 100 lb (45.36 kg)   Gen'l- very thin woman HEENT_ C&S clear Pulm - CTAP Cor - occasional PVCs, maybe a very feint systolic mm best at apex.        Assessment & Plan:  Palpitation - on exam she has PVCs. Explained PVCs with compensatory pause and the symptom of feeling that next heart beat  Plan No further evaluation.

## 2012-09-26 NOTE — Patient Instructions (Signed)
1`. Blood pressure: BP suboptimally controlled with SBP 148.  Plan Increase furosemide to 40 mg daily  2. Sinus headache and pressure - still a problem after having 14 days of Augmentin. 2012 CT and MRI did reveal chronic sinus changes  Plan Refer ENT.  3. Palpitations - occasional premature ventricular contractions on exam. This will cause you to feel a "thump" which is the first beat after a premature beat and the compensatory pause. This is harmless.   4. Heart mummur - on careful exam there is no significant murmur. Murmurs are related to heart valve abnormalities and Darvon does not damage the heart valves.  Plan 2 D echo cardiogram to check the heart valves.

## 2012-09-27 NOTE — Assessment & Plan Note (Signed)
On exam I/VI mm at LSB  Plan 2D echo

## 2012-09-30 ENCOUNTER — Other Ambulatory Visit (HOSPITAL_COMMUNITY): Payer: Medicare Other

## 2012-11-03 ENCOUNTER — Ambulatory Visit (HOSPITAL_COMMUNITY): Payer: Medicare Other | Attending: Internal Medicine | Admitting: Radiology

## 2012-11-03 DIAGNOSIS — F17200 Nicotine dependence, unspecified, uncomplicated: Secondary | ICD-10-CM | POA: Diagnosis not present

## 2012-11-03 DIAGNOSIS — R42 Dizziness and giddiness: Secondary | ICD-10-CM | POA: Diagnosis not present

## 2012-11-03 DIAGNOSIS — I10 Essential (primary) hypertension: Secondary | ICD-10-CM | POA: Diagnosis not present

## 2012-11-03 DIAGNOSIS — I079 Rheumatic tricuspid valve disease, unspecified: Secondary | ICD-10-CM | POA: Insufficient documentation

## 2012-11-03 DIAGNOSIS — R011 Cardiac murmur, unspecified: Secondary | ICD-10-CM | POA: Diagnosis not present

## 2012-11-03 DIAGNOSIS — I080 Rheumatic disorders of both mitral and aortic valves: Secondary | ICD-10-CM | POA: Diagnosis not present

## 2012-11-03 NOTE — Progress Notes (Signed)
Echocardiogram performed.  

## 2012-11-04 ENCOUNTER — Encounter: Payer: Self-pay | Admitting: Internal Medicine

## 2012-11-17 DIAGNOSIS — J322 Chronic ethmoidal sinusitis: Secondary | ICD-10-CM | POA: Diagnosis not present

## 2012-11-17 DIAGNOSIS — J320 Chronic maxillary sinusitis: Secondary | ICD-10-CM | POA: Diagnosis not present

## 2012-11-17 DIAGNOSIS — J323 Chronic sphenoidal sinusitis: Secondary | ICD-10-CM | POA: Diagnosis not present

## 2012-11-17 DIAGNOSIS — J342 Deviated nasal septum: Secondary | ICD-10-CM | POA: Diagnosis not present

## 2012-11-17 DIAGNOSIS — H9319 Tinnitus, unspecified ear: Secondary | ICD-10-CM | POA: Diagnosis not present

## 2012-11-17 DIAGNOSIS — H8110 Benign paroxysmal vertigo, unspecified ear: Secondary | ICD-10-CM | POA: Diagnosis not present

## 2012-11-17 DIAGNOSIS — H6120 Impacted cerumen, unspecified ear: Secondary | ICD-10-CM | POA: Diagnosis not present

## 2012-11-17 DIAGNOSIS — R269 Unspecified abnormalities of gait and mobility: Secondary | ICD-10-CM | POA: Diagnosis not present

## 2012-11-18 ENCOUNTER — Telehealth: Payer: Self-pay | Admitting: Internal Medicine

## 2012-11-18 NOTE — Telephone Encounter (Signed)
Rec'd from Ear Nose and Throat forward 4 pages to Dr.Norins

## 2012-12-09 ENCOUNTER — Telehealth: Payer: Self-pay

## 2012-12-09 DIAGNOSIS — M545 Low back pain: Secondary | ICD-10-CM

## 2012-12-09 MED ORDER — MELOXICAM 15 MG PO TABS: 15.0000 mg | ORAL_TABLET | Freq: Every day | ORAL | Status: DC

## 2012-12-09 MED ORDER — CYCLOBENZAPRINE HCL 5 MG PO TABS: 5.0000 mg | ORAL_TABLET | Freq: Three times a day (TID) | ORAL | Status: DC | PRN

## 2012-12-09 NOTE — Telephone Encounter (Signed)
Patient notified of meds to be sent and to make an appt if not better.

## 2012-12-09 NOTE — Telephone Encounter (Signed)
If really bad - Saturday clinic. Rx: 1) cyclobenzaprine 5 mg tid #10        2) meloxicam 15 mg once a day #30        3) OV Monday - come in 1 hr early for lumbar spine x-ray

## 2012-12-09 NOTE — Telephone Encounter (Signed)
Phone call from patient's husband stating patient has severe back pain this morning. She can not get out of bed. She would like a pain medicine called in to her pharmacy. Please advise.

## 2012-12-12 ENCOUNTER — Ambulatory Visit (INDEPENDENT_AMBULATORY_CARE_PROVIDER_SITE_OTHER): Payer: Medicare Other | Admitting: Internal Medicine

## 2012-12-12 ENCOUNTER — Encounter: Payer: Self-pay | Admitting: Internal Medicine

## 2012-12-12 VITALS — BP 162/80 | HR 62 | Temp 97.0°F | Wt 104.8 lb

## 2012-12-12 DIAGNOSIS — M545 Low back pain: Secondary | ICD-10-CM

## 2012-12-12 MED ORDER — CYCLOBENZAPRINE HCL 5 MG PO TABS: 5.0000 mg | ORAL_TABLET | Freq: Three times a day (TID) | ORAL | Status: DC | PRN

## 2012-12-12 MED ORDER — TRAMADOL HCL 50 MG PO TABS: 50.0000 mg | ORAL_TABLET | Freq: Three times a day (TID) | ORAL | Status: DC

## 2012-12-12 NOTE — Patient Instructions (Signed)
Back and hip pain: on exam there is no evidence of a herniated disk that would require any surgical intervention. There is some weakness vs fear of pain that limits abilty to step up to the exam table. Normal sensation to light touch and pin-prick with minimal decrease in deep vibratory sensation. On exam the hips move normally.  Plan Continue the meloxicam once a day - the equivalent of 800 mg of motrin three times a day  OK to continue the flexeril - new Rx to drug store  Will add Tramadol 50 mg three times a day as additional pain medication.  Best position should be on your back with pillows under the knees.  If none of this helps will get x-rays and if needed MRI of the back.      Back Pain, Adult Low back pain is very common. About 1 in 5 people have back pain.The cause of low back pain is rarely dangerous. The pain often gets better over time.About half of people with a sudden onset of back pain feel better in just 2 weeks. About 8 in 10 people feel better by 6 weeks.  CAUSES Some common causes of back pain include:  Strain of the muscles or ligaments supporting the spine.  Wear and tear (degeneration) of the spinal discs.  Arthritis.  Direct injury to the back. DIAGNOSIS Most of the time, the direct cause of low back pain is not known.However, back pain can be treated effectively even when the exact cause of the pain is unknown.Answering your caregiver's questions about your overall health and symptoms is one of the most accurate ways to make sure the cause of your pain is not dangerous. If your caregiver needs more information, he or she may order lab work or imaging tests (X-rays or MRIs).However, even if imaging tests show changes in your back, this usually does not require surgery. HOME CARE INSTRUCTIONS For many people, back pain returns.Since low back pain is rarely dangerous, it is often a condition that people can learn to [facility] their own.   Remain active. It is  stressful on the back to sit or stand in one place. Do not sit, drive, or stand in one place for more than 30 minutes at a time. Take short walks on level surfaces as soon as pain allows.Try to increase the length of time you walk each day.  Do not stay in bed.Resting more than 1 or 2 days can delay your recovery.  Do not avoid exercise or work.Your body is made to move.It is not dangerous to be active, even though your back may hurt.Your back will likely heal faster if you return to being active before your pain is gone.  Pay attention to your body when you bend and lift. Many people have less discomfortwhen lifting if they bend their knees, keep the load close to their bodies,and avoid twisting. Often, the most comfortable positions are those that put less stress on your recovering back.  Find a comfortable position to sleep. Use a firm mattress and lie on your side with your knees slightly bent. If you lie on your back, put a pillow under your knees.  Only take over-the-counter or prescription medicines as directed by your caregiver. Over-the-counter medicines to reduce pain and inflammation are often the most helpful.Your caregiver may prescribe muscle relaxant drugs.These medicines help dull your pain so you can more quickly return to your normal activities and healthy exercise.  Put ice on the injured area.  Put  ice in a plastic bag.  Place a towel between your skin and the bag.  Leave the ice on for 15-20 minutes, 3-4 times a day for the first 2 to 3 days. After that, ice and heat may be alternated to reduce pain and spasms.  Ask your caregiver about trying back exercises and gentle massage. This may be of some benefit.  Avoid feeling anxious or stressed.Stress increases muscle tension and can worsen back pain.It is important to recognize when you are anxious or stressed and learn ways to manage it.Exercise is a great option. SEEK MEDICAL CARE IF:  You have pain that is  not relieved with rest or medicine.  You have pain that does not improve in 1 week.  You have new symptoms.  You are generally not feeling well. SEEK IMMEDIATE MEDICAL CARE IF:   You have pain that radiates from your back into your legs.  You develop new bowel or bladder control problems.  You have unusual weakness or numbness in your arms or legs.  You develop nausea or vomiting.  You develop abdominal pain.  You feel faint. Document Released: 02/16/2005 Document Revised: 08/18/2011 Document Reviewed: 07/07/2010 [facility] Patient Information 2014 [place], [place].

## 2012-12-12 NOTE — Progress Notes (Signed)
Subjective:    Patient ID: Sheena [name], female    DOB: [birth year], 67 y.o.   MRN: [id number]  HPI Mrs. Klingler presents for recurrent back and hip pain. She had pain starting last Thursday at her low back which by nightfall involved both hips. She has not been able to rest due to pain. Last night she started having a pulling sensation with a step up in the degree of pain.   Meloxicam 15 mg and flexeril 5 mg tid was called in to drugstore. She said this did not help at all. She did take APAP which gave some relief for a brief period of time.  Chart reviewed: L-S spine films Jan '12: scoliosis, narrowing L1-2, L2-3, L3-4  Past Medical History  Diagnosis Date  . Heart murmur   . Hypertension   . Anxiety   . Scarlet fever with other complications childhood    "had to learn to work again" has had leg weakness   Past Surgical History  Procedure Laterality Date  . Peridontal surgery     Family History  Problem Relation Age of Onset  . Cancer Mother     breast, spine mets  . Hypertension Mother   . Heart disease Mother     CABG 4 vessel  . COPD Father     emphysema  . Heart disease Brother     MI   History   Social History  . Marital Status: Married    Spouse Name: N/A    Number of Children: 4  . Years of Education: 10   Occupational History  . disability    Social History Main Topics  . Smoking status: Current Every Day Smoker -- 1.00 packs/day for 45 years    Types: Cigarettes  . Smokeless tobacco: Never Used  . Alcohol Use: No  . Drug Use: No  . Sexual Activity: Not Currently   Other Topics Concern  . Not on file   Social History Narrative   10th grade. Quit school to work and help raise nieces. Married - '68 -68yr/divorced; remarried '82 - . 3 son '69, '73, '84; 1 dtr '71. 3 grandchildren. 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 No history of abuse.     Current Outpatient Prescriptions on File Prior to Visit   Medication Sig Dispense Refill  . acetaminophen (TYLENOL) 500 MG tablet Take 500 mg by mouth 2 (two) times daily as needed. For pain      . cyclobenzaprine (FLEXERIL) 5 MG tablet Take 1 tablet (5 mg total) by mouth 3 (three) times daily as needed for muscle spasms.  10 tablet  0  . furosemide (LASIX) 40 MG tablet Take 1 tablet (40 mg total) by mouth daily.  30 tablet  3  . meloxicam (MOBIC) 15 MG tablet Take 1 tablet (15 mg total) by mouth daily.  30 tablet  0  . metoprolol (LOPRESSOR) 50 MG tablet Take 1 tablet (50 mg total) by mouth 2 (two) times daily.  60 tablet  5   No current facility-administered medications on file prior to visit.     Review of Systems System review is negative for any constitutional, cardiac, pulmonary, GI or neuro symptoms or complaints other than as described in the HPI.     Objective:   Physical Exam Filed Vitals:   12/12/12 1702  BP: 162/80  Pulse: 62  Temp: 97 F (36.1 C)   Gen'l- WNWD thin woman  in her pajamas HEENT- C&S clear Cor + radial, RRR, II/VI systolic mm best at LSB Pulm - CTAP MSK -back:Back exam: normal stand; abnormal flex to greater than 40 degrees; normal gait; normal toe/heel walk; abnormal step up to exam table-weaker with the left leg, able to step up with the right; normal SLR sitting; normal DTRs at the patellar tendons; normal sensation to light touch, pin-prick and minimal decreased deep vibratory stimulus; no  CVA tenderness; able to move supine to sitting witout assistance. Hips with normal internal and external rotation.        Assessment & Plan:

## 2012-12-13 DIAGNOSIS — M545 Low back pain, unspecified: Secondary | ICD-10-CM | POA: Insufficient documentation

## 2012-12-13 DIAGNOSIS — G8929 Other chronic pain: Secondary | ICD-10-CM | POA: Insufficient documentation

## 2012-12-13 NOTE — Assessment & Plan Note (Signed)
Back and hip pain: on exam there is no evidence of a herniated disk that would require any surgical intervention. There is some weakness vs fear of pain that limits abilty to step up to the exam table. Normal sensation to light touch and pin-prick with minimal decrease in deep vibratory sensation. On exam the hips move normally.  Plan Continue the meloxicam once a day - the equivalent of 800 mg of motrin three times a day  OK to continue the flexeril - new Rx to drug store  Will add Tramadol 50 mg three times a day as additional pain medication.  Best position should be on your back with pillows under the knees.  If none of this helps will get x-rays and if needed MRI of the back.

## 2012-12-16 ENCOUNTER — Telehealth: Payer: Self-pay | Admitting: Internal Medicine

## 2012-12-16 DIAGNOSIS — M545 Low back pain: Secondary | ICD-10-CM

## 2012-12-16 NOTE — Telephone Encounter (Signed)
Patient notified of order that was placed.  She states the Tramadol is not helping. I called her back per Dr Debby Bud she can take Tylenol 1000 mg TID. This message was left on her voicemail.

## 2012-12-16 NOTE — Telephone Encounter (Signed)
Had a non-radicular exam. Order placed for L-S spine films- can come by this PM

## 2012-12-16 NOTE — Telephone Encounter (Signed)
12/17/2015   Pt pulled something in back, saw Dr. Debby Bud 12/12/2012, and is still in severe pain.  Pt wants to have an MRI or Xray.  Please contact pt's husband in regards

## 2012-12-19 ENCOUNTER — Ambulatory Visit: Payer: Medicare Other | Admitting: Internal Medicine

## 2012-12-19 ENCOUNTER — Ambulatory Visit (INDEPENDENT_AMBULATORY_CARE_PROVIDER_SITE_OTHER)
Admission: RE | Admit: 2012-12-19 | Discharge: 2012-12-19 | Disposition: A | Discharge status: Home / Self Care | Payer: Medicare Other | Source: Ambulatory Visit | Attending: Internal Medicine | Admitting: Internal Medicine

## 2012-12-19 DIAGNOSIS — M545 Low back pain: Secondary | ICD-10-CM | POA: Diagnosis not present

## 2012-12-20 ENCOUNTER — Telehealth: Payer: Self-pay

## 2012-12-20 ENCOUNTER — Other Ambulatory Visit: Payer: Self-pay | Admitting: Internal Medicine

## 2012-12-20 DIAGNOSIS — M439 Deforming dorsopathy, unspecified: Secondary | ICD-10-CM

## 2012-12-20 DIAGNOSIS — M545 Low back pain: Secondary | ICD-10-CM

## 2012-12-20 NOTE — Telephone Encounter (Signed)
Laxative - lactulose 30 cc twice a day until BM

## 2012-12-20 NOTE — Telephone Encounter (Signed)
Husband states she has been constipated for 10 days.

## 2012-12-21 NOTE — Telephone Encounter (Signed)
Left message to return call 

## 2012-12-21 NOTE — Telephone Encounter (Signed)
Patient husband returned call and he was advised of MD msg

## 2012-12-23 ENCOUNTER — Telehealth: Payer: Self-pay

## 2012-12-23 NOTE — Telephone Encounter (Signed)
Phone call from patients husband stating her cough is getting worse. It kept them up all night. Please advise what to take

## 2012-12-23 NOTE — Telephone Encounter (Signed)
Patient has been notified

## 2012-12-23 NOTE — Telephone Encounter (Signed)
She needs to be evaluated.

## 2012-12-26 ENCOUNTER — Telehealth: Payer: Self-pay | Admitting: Internal Medicine

## 2012-12-26 MED ORDER — PROMETHAZINE-CODEINE 6.25-10 MG/5ML PO SYRP: 5.0000 mL | ORAL_SOLUTION | Freq: Four times a day (QID) | ORAL | Status: DC | PRN

## 2012-12-26 NOTE — Telephone Encounter (Signed)
Script called to pharmacy

## 2012-12-26 NOTE — Telephone Encounter (Signed)
12/26/2012  Pts husband left message requesting an RX for pt's cough.  Please contact at [phone]

## 2012-12-26 NOTE — Telephone Encounter (Signed)
Ok for phenergan with codeine 1 tsp every 6 hours. 180 ml. No refills

## 2013-01-01 IMAGING — CT CT HEAD W/O CM
1 of 2 series · 13 of 30 positions shown, 17 images · non-contrast
Comparison: None

CLINICAL DATA: Dizziness and palpitations.

CT HEAD WITHOUT CONTRAST
TECHNIQUE: Contiguous axial images were obtained from the base of
the skull through the vertex without contrast.

[Series 2: brain · axial · 0.47mm/px · z∈[+157,+279]mm · 13 of 28 slices shown, 17 images]
[im 2/28  brain]
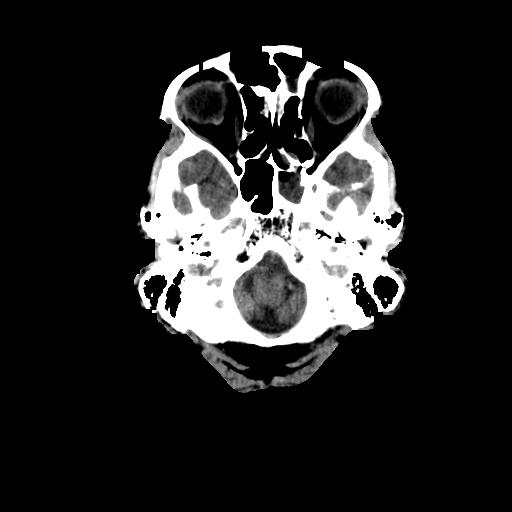
[im 2/28  bone]
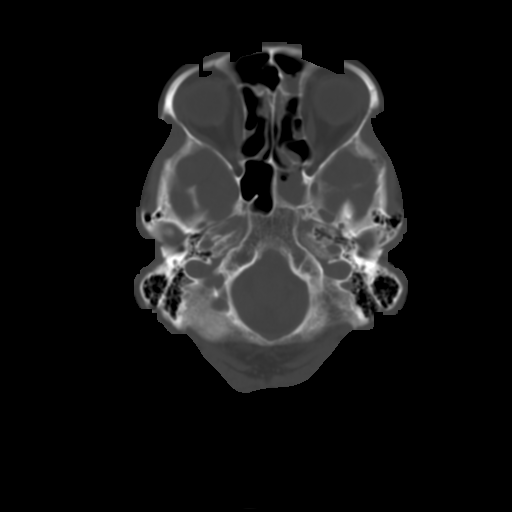
[im 4/28  brain]
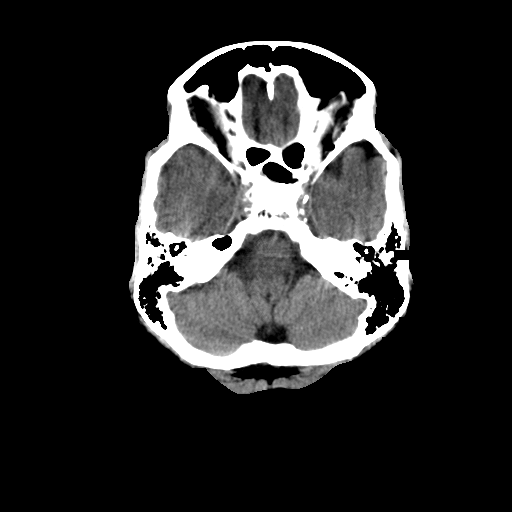
[im 6/28  brain]
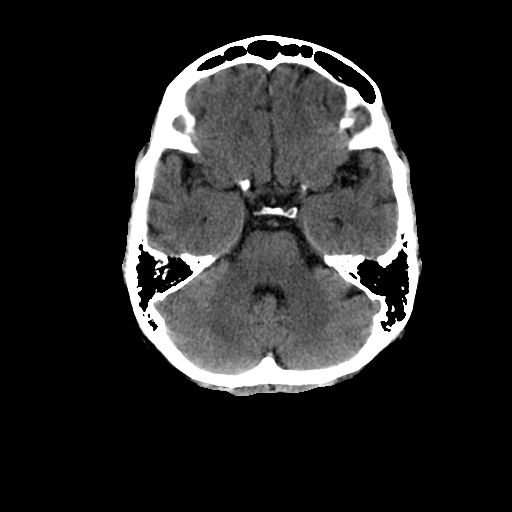
[im 8/28  brain]
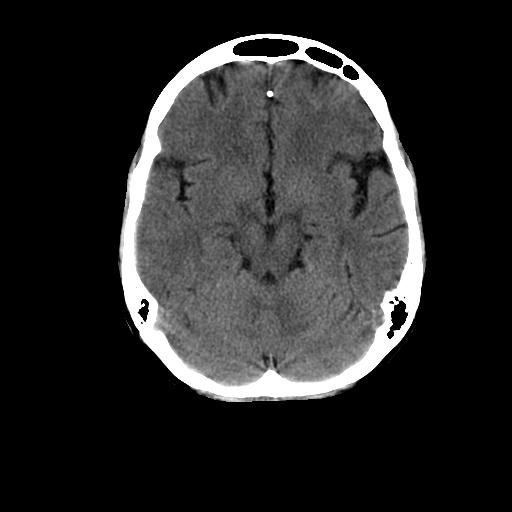
[im 10/28  brain]
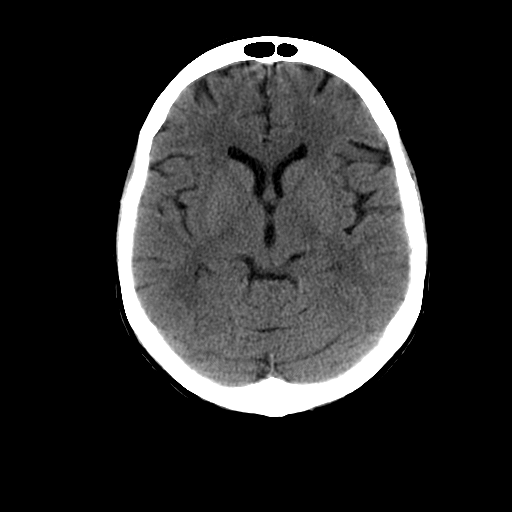
[im 10/28  bone]
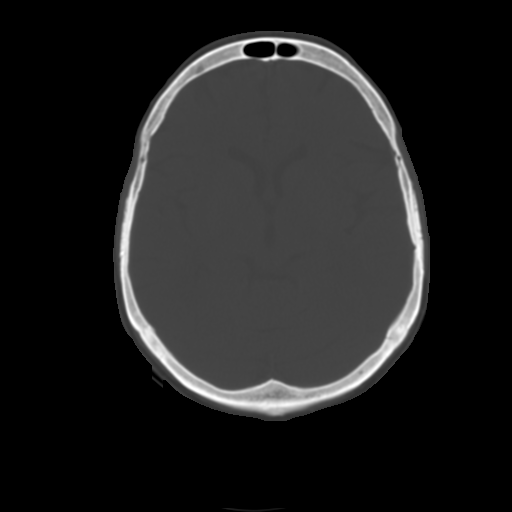
[im 12/28  brain]
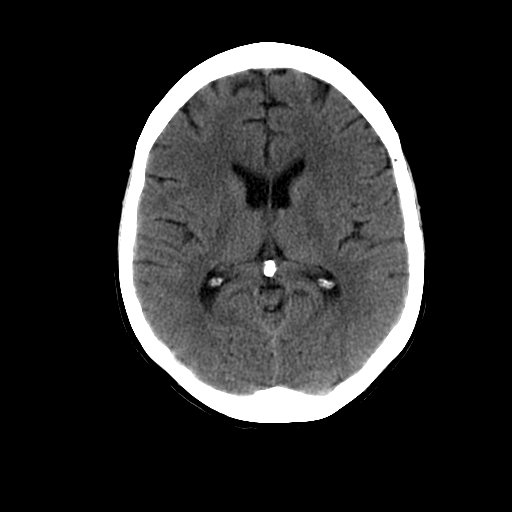
[im 14/28  brain]
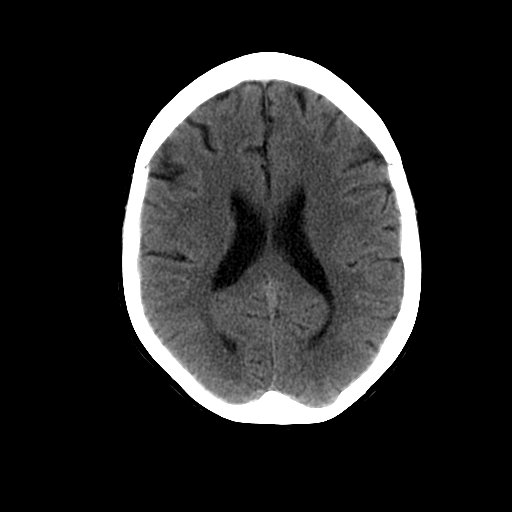
[im 16/28  brain]
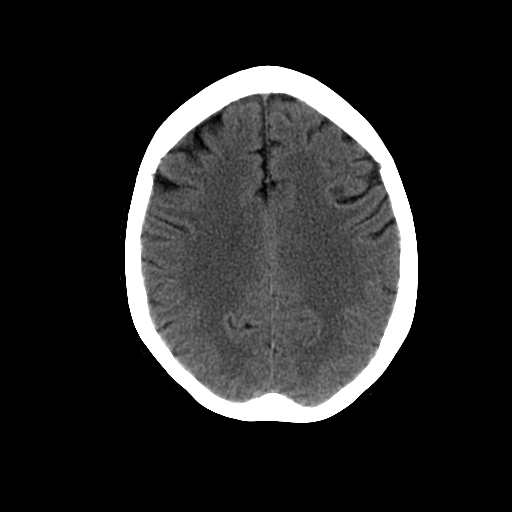
[im 18/28  brain]
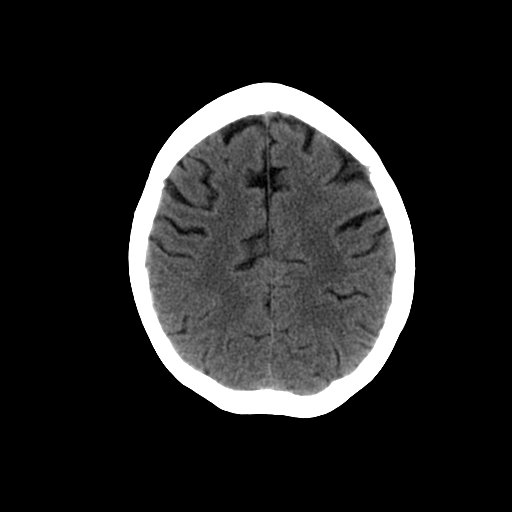
[im 18/28  bone]
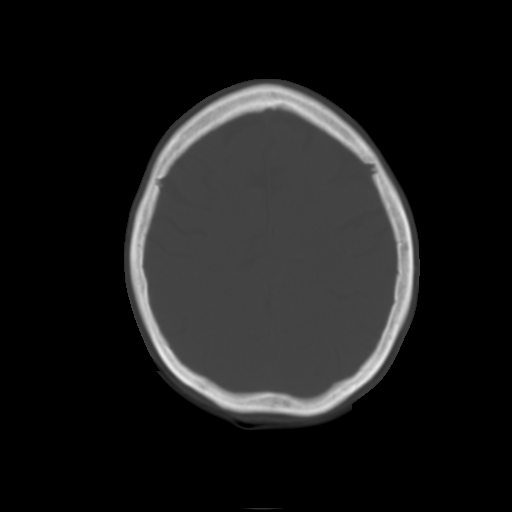
[im 20/28  brain]
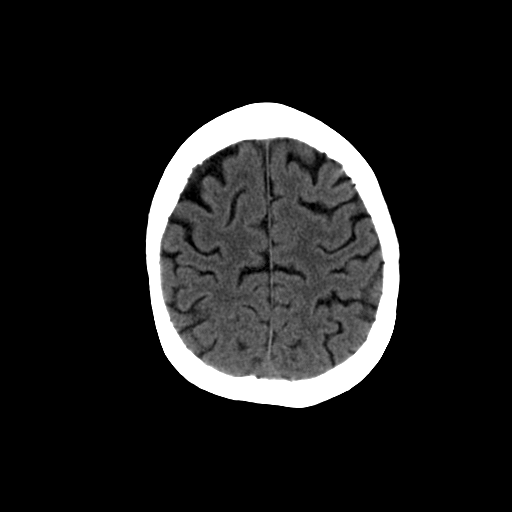
[im 22/28  brain]
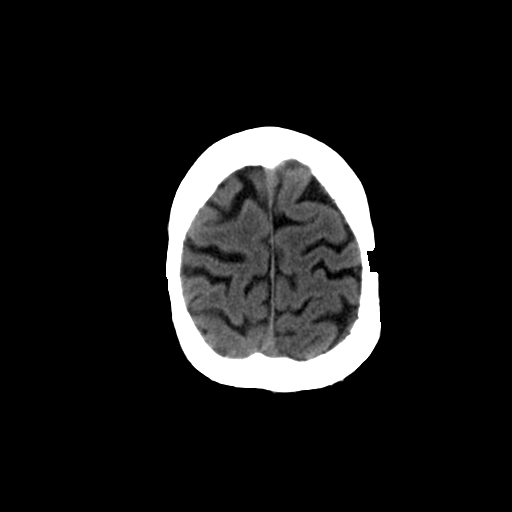
[im 24/28  brain]
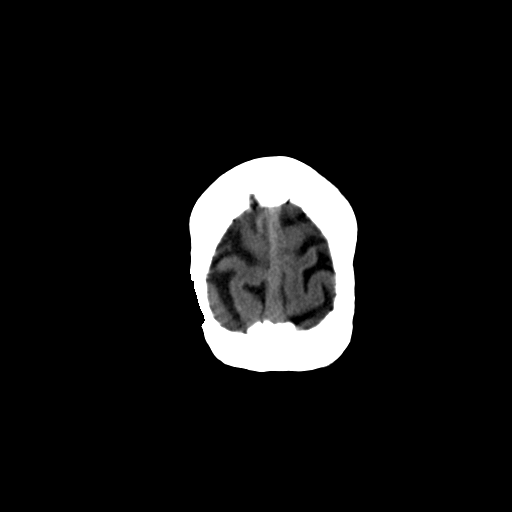
[im 26/28  brain]
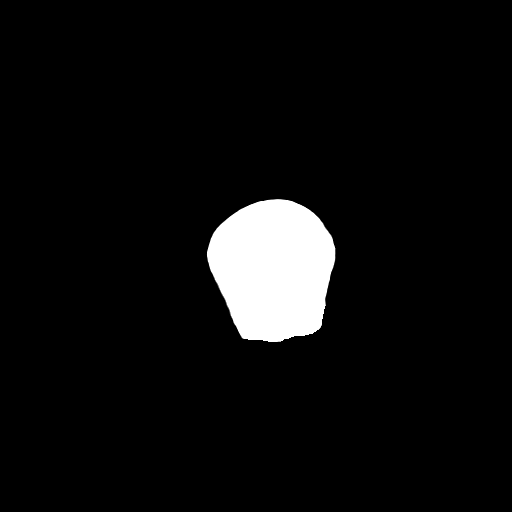
[im 26/28  bone]
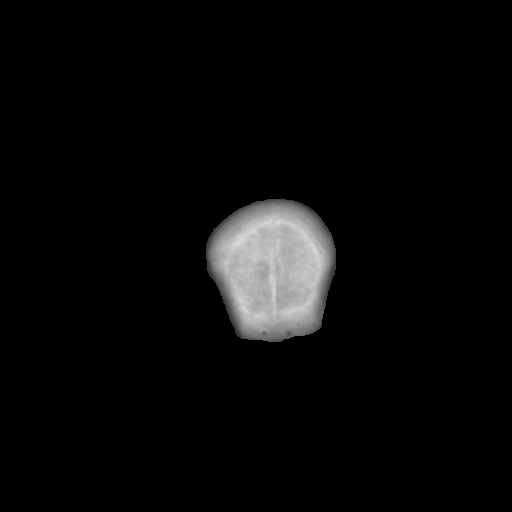

[13 of 30 positions shown; findings below may reference images not displayed]

FINDINGS: No acute intracranial abnormalities are identified,
including mass lesion or mass effect, hydrocephalus, extra-axial
fluid collection, midline shift, hemorrhage, or acute infarction.

The visualized bony calvarium is unremarkable.
IMPRESSION: No evidence of acute intracranial abnormality.

## 2013-01-01 IMAGING — CR DG CHEST 2V
1 series · 1 of 1 positions shown · non-contrast
Comparison: None.

CLINICAL DATA: Dizziness.  Heart racing.  Weakness, murmur.
History of smoking.

CHEST - 2 VIEW

[w chest lat]
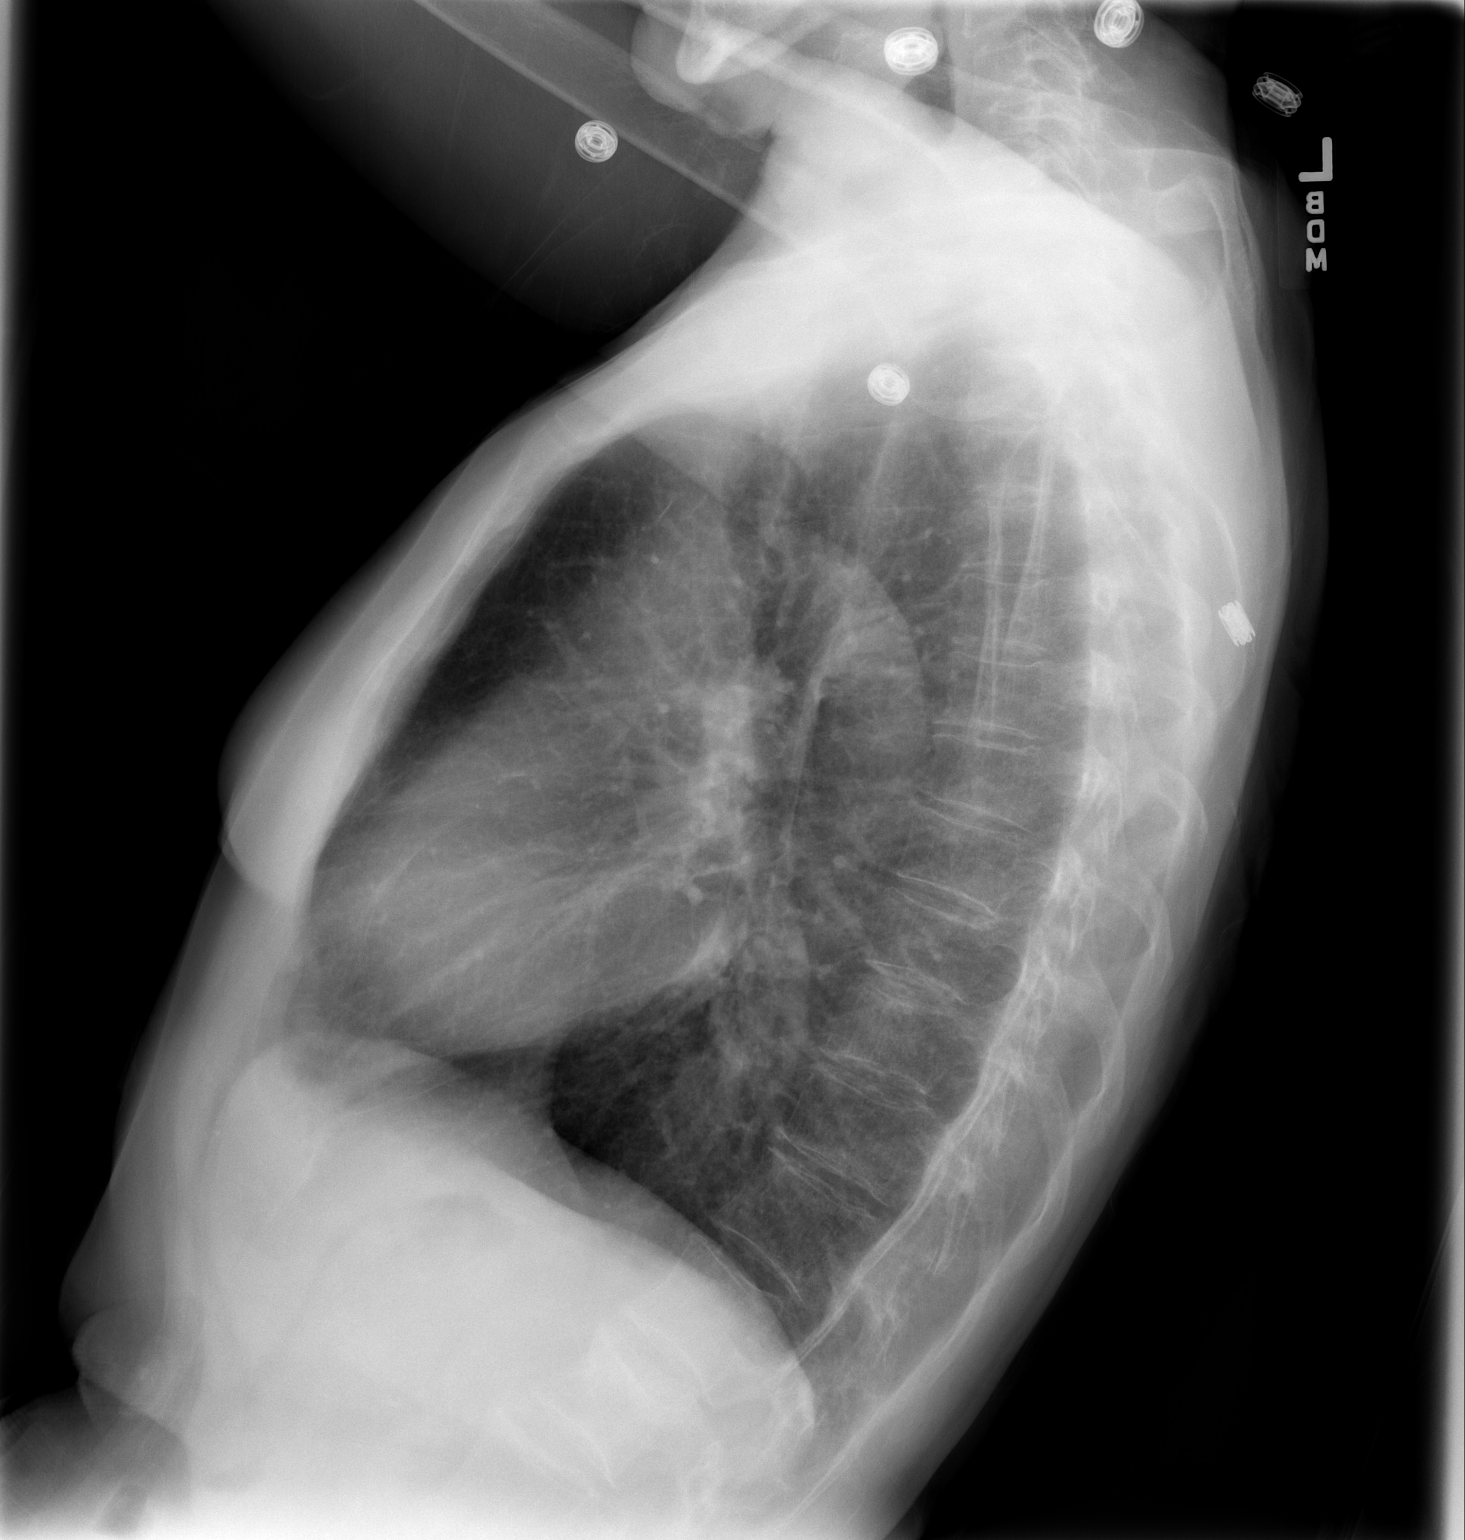

[1 of 1 positions shown; findings below may reference images not displayed]

FINDINGS: Lungs are hyperinflated.  There is marked perihilar for
bronchial thickening.  There are no focal consolidations or pleural
effusions.  Heart is mildly enlarged.  No pulmonary edema.  Bones
appear osteopenic.  There is probable superior endplate fracture of
L2.
IMPRESSION: 1.  Hyperinflation and bronchitic change.
2. No focal pulmonary abnormality.
3.  Probable L2 osteoporotic type fracture. Per CMS PQRS reporting
requirements (PQRS Measure 24): Given the patient's age of greater
than 50 and the fracture site (hip, distal radius, or spine), the
patient should be tested for osteoporosis using DXA, and the
appropriate treatment considered based on the DXA results.

## 2013-01-04 ENCOUNTER — Other Ambulatory Visit: Payer: Self-pay | Admitting: Internal Medicine

## 2013-01-04 ENCOUNTER — Ambulatory Visit
Admission: RE | Admit: 2013-01-04 | Discharge: 2013-01-04 | Disposition: A | Discharge status: Home / Self Care | Payer: Medicare Other | Source: Ambulatory Visit | Attending: Internal Medicine | Admitting: Internal Medicine

## 2013-01-04 DIAGNOSIS — M4850XA Collapsed vertebra, not elsewhere classified, site unspecified, initial encounter for fracture: Secondary | ICD-10-CM | POA: Insufficient documentation

## 2013-01-04 DIAGNOSIS — M439 Deforming dorsopathy, unspecified: Secondary | ICD-10-CM

## 2013-01-04 DIAGNOSIS — M545 Low back pain, unspecified: Secondary | ICD-10-CM

## 2013-01-04 DIAGNOSIS — M549 Dorsalgia, unspecified: Secondary | ICD-10-CM | POA: Diagnosis not present

## 2013-01-04 DIAGNOSIS — Z8781 Personal history of (healed) traumatic fracture: Secondary | ICD-10-CM | POA: Insufficient documentation

## 2013-01-05 ENCOUNTER — Other Ambulatory Visit: Payer: Self-pay | Admitting: Internal Medicine

## 2013-01-05 ENCOUNTER — Telehealth: Payer: Self-pay | Admitting: *Deleted

## 2013-01-05 NOTE — Telephone Encounter (Signed)
Spoke with pt advised of MDs message 

## 2013-01-06 ENCOUNTER — Other Ambulatory Visit: Payer: Self-pay | Admitting: Internal Medicine

## 2013-01-10 ENCOUNTER — Ambulatory Visit (HOSPITAL_COMMUNITY)
Admission: RE | Admit: 2013-01-10 | Discharge: 2013-01-10 | Disposition: A | Discharge status: Home / Self Care | Payer: Medicare Other | Source: Ambulatory Visit | Attending: Internal Medicine | Admitting: Internal Medicine

## 2013-01-10 DIAGNOSIS — M545 Low back pain: Secondary | ICD-10-CM | POA: Diagnosis not present

## 2013-01-12 ENCOUNTER — Other Ambulatory Visit (HOSPITAL_COMMUNITY): Payer: Self-pay | Admitting: Interventional Radiology

## 2013-01-12 DIAGNOSIS — IMO0001 Reserved for inherently not codable concepts without codable children: Secondary | ICD-10-CM

## 2013-01-12 DIAGNOSIS — T148XXA Other injury of unspecified body region, initial encounter: Secondary | ICD-10-CM

## 2013-01-12 DIAGNOSIS — S22080A Wedge compression fracture of T11-T12 vertebra, initial encounter for closed fracture: Secondary | ICD-10-CM

## 2013-01-12 DIAGNOSIS — M549 Dorsalgia, unspecified: Secondary | ICD-10-CM

## 2013-01-12 DIAGNOSIS — IMO0002 Reserved for concepts with insufficient information to code with codable children: Secondary | ICD-10-CM

## 2013-01-13 ENCOUNTER — Telehealth: Payer: Self-pay

## 2013-01-13 MED ORDER — ALPRAZOLAM 0.5 MG PO TABS: 0.5000 mg | ORAL_TABLET | Freq: Four times a day (QID) | ORAL | Status: DC | PRN

## 2013-01-13 NOTE — Telephone Encounter (Signed)
Phone call from patients husband stating son was killed in a motorcycle accident a couple nights ago. Patient is not doing well. She's not eating due to her throat burning. Please advise. Thanks

## 2013-01-13 NOTE — Telephone Encounter (Signed)
My deepest condolences!!  For burning throat use Mylanta every 4 hrs as needed.  For emotional upset if needed she may have alprazolam 0.5 mg q6 prn  #30, no refills

## 2013-01-13 NOTE — Telephone Encounter (Signed)
Patient called back and left a message. I called him back and left a message for him to call

## 2013-01-13 NOTE — Telephone Encounter (Signed)
Patient's husband called back and was given MD message. Alprazolam called in

## 2013-01-13 NOTE — Telephone Encounter (Signed)
Left message for return call.

## 2013-01-23 ENCOUNTER — Other Ambulatory Visit: Payer: Self-pay | Admitting: Internal Medicine

## 2013-02-08 ENCOUNTER — Other Ambulatory Visit (HOSPITAL_COMMUNITY): Payer: Self-pay | Admitting: Radiology

## 2013-02-10 ENCOUNTER — Encounter (HOSPITAL_COMMUNITY): Payer: Self-pay | Admitting: Pharmacy Technician

## 2013-02-16 ENCOUNTER — Encounter (HOSPITAL_COMMUNITY): Payer: Self-pay

## 2013-02-16 ENCOUNTER — Ambulatory Visit (HOSPITAL_COMMUNITY)
Admission: RE | Admit: 2013-02-16 | Discharge: 2013-02-16 | Disposition: A | Discharge status: Home / Self Care | Payer: Medicare Other | Source: Ambulatory Visit | Attending: Interventional Radiology | Admitting: Interventional Radiology

## 2013-02-16 ENCOUNTER — Telehealth: Payer: Self-pay

## 2013-02-16 DIAGNOSIS — M8448XA Pathological fracture, other site, initial encounter for fracture: Secondary | ICD-10-CM | POA: Insufficient documentation

## 2013-02-16 DIAGNOSIS — F17200 Nicotine dependence, unspecified, uncomplicated: Secondary | ICD-10-CM | POA: Insufficient documentation

## 2013-02-16 DIAGNOSIS — I10 Essential (primary) hypertension: Secondary | ICD-10-CM | POA: Diagnosis not present

## 2013-02-16 DIAGNOSIS — R011 Cardiac murmur, unspecified: Secondary | ICD-10-CM | POA: Diagnosis not present

## 2013-02-16 DIAGNOSIS — M549 Dorsalgia, unspecified: Secondary | ICD-10-CM | POA: Diagnosis not present

## 2013-02-16 DIAGNOSIS — S22009A Unspecified fracture of unspecified thoracic vertebra, initial encounter for closed fracture: Secondary | ICD-10-CM | POA: Diagnosis not present

## 2013-02-16 DIAGNOSIS — S22080A Wedge compression fracture of T11-T12 vertebra, initial encounter for closed fracture: Secondary | ICD-10-CM

## 2013-02-16 DIAGNOSIS — M4856XS Collapsed vertebra, not elsewhere classified, lumbar region, sequela of fracture: Secondary | ICD-10-CM

## 2013-02-16 LAB — BASIC METABOLIC PANEL
BUN: 14 mg/dL (ref 6–23)
Calcium: 9.6 mg/dL (ref 8.4–10.5)
GFR calc Af Amer: 65 mL/min — ABNORMAL LOW (ref >90)
GFR calc non Af Amer: 56 mL/min — ABNORMAL LOW (ref >90)
Glucose, Bld: 96 mg/dL (ref 70–99)
Potassium: 4.2 mEq/L (ref 3.5–5.1)

## 2013-02-16 LAB — CBC
HCT: 34.3 % — ABNORMAL LOW (ref 36.0–46.0)
MCH: 31.9 pg (ref 26.0–34.0)
MCHC: 33.8 g/dL (ref 30.0–36.0)
Platelets: 360 10*3/uL (ref 150–400)
RBC: 3.64 MIL/uL — ABNORMAL LOW (ref 3.87–5.11)
RDW: 13 % (ref 11.5–15.5)
WBC: 8.7 10*3/uL (ref 4.0–10.5)

## 2013-02-16 LAB — PROTIME-INR
INR: 0.95 (ref 0.00–1.49)
Prothrombin Time: 12.5 seconds (ref 11.6–15.2)

## 2013-02-16 MED ORDER — IOHEXOL 300 MG/ML  SOLN
50.0000 mL | Freq: Once | INTRAMUSCULAR | Status: AC | PRN
  Administered 2013-02-16: 1 mL via INTRAVENOUS

## 2013-02-16 MED ORDER — SODIUM CHLORIDE 0.9 % IV SOLN
Freq: Once | INTRAVENOUS | Status: AC
  Administered 2013-02-16: 08:00:00 via INTRAVENOUS

## 2013-02-16 MED ORDER — FENTANYL CITRATE 0.05 MG/ML IJ SOLN
INTRAMUSCULAR | Status: AC | PRN
  Administered 2013-02-16: 12.5 ug via INTRAVENOUS
  Administered 2013-02-16: 25 ug via INTRAVENOUS
  Administered 2013-02-16: 12.5 ug via INTRAVENOUS

## 2013-02-16 MED ORDER — HYDROMORPHONE HCL PF 1 MG/ML IJ SOLN
INTRAMUSCULAR | Status: AC
  Filled 2013-02-16: qty 1

## 2013-02-16 MED ORDER — SODIUM CHLORIDE 0.9 % IV SOLN: INTRAVENOUS | Status: AC

## 2013-02-16 MED ORDER — MIDAZOLAM HCL 2 MG/2ML IJ SOLN
INTRAMUSCULAR | Status: AC
  Filled 2013-02-16: qty 4

## 2013-02-16 MED ORDER — FENTANYL CITRATE 0.05 MG/ML IJ SOLN
INTRAMUSCULAR | Status: AC
  Filled 2013-02-16: qty 4

## 2013-02-16 MED ORDER — MIDAZOLAM HCL 2 MG/2ML IJ SOLN
INTRAMUSCULAR | Status: AC | PRN
  Administered 2013-02-16: 1 mg via INTRAVENOUS
  Administered 2013-02-16 (×2): 0.5 mg via INTRAVENOUS

## 2013-02-16 MED ORDER — TOBRAMYCIN SULFATE 1.2 G IJ SOLR
INTRAMUSCULAR | Status: AC
  Filled 2013-02-16: qty 1.2

## 2013-02-16 MED ORDER — CEFAZOLIN SODIUM-DEXTROSE 2-3 GM-% IV SOLR
2.0000 g | Freq: Once | INTRAVENOUS | Status: AC
  Administered 2013-02-16: 2 g via INTRAVENOUS
  Filled 2013-02-16: qty 50

## 2013-02-16 NOTE — Procedures (Signed)
S/P T 12 KP with biopsy 

## 2013-02-16 NOTE — ED Notes (Signed)
Fluid bolus initiated for BP

## 2013-02-16 NOTE — ED Notes (Signed)
300cc bolus completed 98/51.  MD aware wants fluids to go to 75cc/hr

## 2013-02-16 NOTE — ED Notes (Signed)
O2 d/c'd 

## 2013-02-16 NOTE — Telephone Encounter (Signed)
The patient called and is hoping to get an rx for a walker due to the procedure she had today at the hospital   Thanks!    Callback - [phone]

## 2013-02-16 NOTE — H&P (Signed)
Sheena [name] is an 67 y.o. female.   Chief Complaint: painful vertebral fractures Thoracic 12 and Lumbar 2 fractures Pain 5-6/10 Consulted Dr Corliss Skains last week regarding Veterbroplasty/Kyphoplasty Scheduled now for same  HPI: HTN; smoker  Past Medical History  Diagnosis Date  . Heart murmur   . Hypertension   . Anxiety   . Scarlet fever with other complications childhood    "had to learn to work again" has had leg weakness    Past Surgical History  Procedure Laterality Date  . Peridontal surgery      Family History  Problem Relation Age of Onset  . Cancer Mother     breast, spine mets  . Hypertension Mother   . Heart disease Mother     CABG 4 vessel  . COPD Father     emphysema  . Heart disease Brother     MI   Social History:  reports that she has been smoking Cigarettes.  She has a 45 pack-year smoking history. She has never used smokeless tobacco. She reports that she does not drink alcohol or use illicit drugs.  Allergies:  Allergies  Allergen Reactions  . Antihistamines, Diphenhydramine-Type Other (See Comments)    REACTION: palpitations, feel jittery  . Aspirin Other (See Comments)    REACTION: Stomach cramps     (Not in a hospital admission)  Results for orders placed during the hospital encounter of 02/16/13 (from the past 48 hour(s))  CBC     Status: Abnormal   Collection Time    02/16/13  7:37 AM      Result Value Range   WBC 8.7  4.0 - 10.5 K/uL   RBC 3.64 (*) 3.87 - 5.11 MIL/uL   Hemoglobin 11.6 (*) 12.0 - 15.0 g/dL   HCT 40.9 (*) 81.1 - 91.4 %   MCV 94.2  78.0 - 100.0 fL   MCH 31.9  26.0 - 34.0 pg   MCHC 33.8  30.0 - 36.0 g/dL   RDW 78.2  95.6 - 21.3 %   Platelets 360  150 - 400 K/uL   No results found.  Review of Systems  Constitutional: Negative for fever and weight loss.  Respiratory: Positive for cough.   Cardiovascular: Negative for chest pain.  Gastrointestinal: Negative for nausea, vomiting and abdominal pain.   Musculoskeletal: Positive for back pain.  Neurological: Positive for weakness. Negative for dizziness and headaches.    Blood pressure 151/71, pulse 83, temperature 97.5 F (36.4 C), temperature source Oral, resp. rate 18, height 5' (1.524 m), weight 100 lb (45.36 kg), SpO2 97.00%. Physical Exam  Constitutional: She is oriented to person, place, and time. She appears well-developed.  thin  Cardiovascular: Normal rate and regular rhythm.   Murmur heard. Respiratory: Effort normal and breath sounds normal. She has no wheezes.  GI: Soft. Bowel sounds are normal. There is no tenderness.  Musculoskeletal: Normal range of motion. She exhibits tenderness.  Back pain  Neurological: She is alert and oriented to person, place, and time. Coordination normal.  Skin: Skin is warm and dry.  Psychiatric: She has a normal mood and affect. Her behavior is normal. Judgment and thought content normal.     Assessment/Plan Painful fxs at T12 and L2 Scheduled now for T12 and possible L2 KP/VP Pt and family aware of procedure benefits and risks and agreeable to proceed Consent signed and in chart  Kadey Mihalic A 02/16/2013, 8:30 AM

## 2013-02-17 ENCOUNTER — Telehealth (HOSPITAL_COMMUNITY): Payer: Self-pay | Admitting: Radiology

## 2013-02-17 NOTE — Telephone Encounter (Signed)
I called and spoke to patients husband and he states the script is no longer needed. I will save it in my folder in case something changes.

## 2013-02-17 NOTE — Telephone Encounter (Signed)
Rx for DME done

## 2013-02-20 ENCOUNTER — Other Ambulatory Visit: Payer: Self-pay | Admitting: Internal Medicine

## 2013-02-20 ENCOUNTER — Telehealth: Payer: Self-pay

## 2013-02-20 NOTE — Telephone Encounter (Signed)
Patient's husband called back requesting the script for the walker be faxed to [phone].

## 2013-02-20 NOTE — Telephone Encounter (Signed)
The patient's husband called hoping to get more information about how to get the patient a walker.   Callback - [phone]

## 2013-02-20 NOTE — Telephone Encounter (Addendum)
I spoke to patient's husband and let him know the script for the walker is at the front desk

## 2013-03-06 NOTE — Telephone Encounter (Signed)
error 

## 2013-03-07 ENCOUNTER — Other Ambulatory Visit (HOSPITAL_COMMUNITY): Payer: Self-pay | Admitting: Interventional Radiology

## 2013-03-07 DIAGNOSIS — M549 Dorsalgia, unspecified: Secondary | ICD-10-CM

## 2013-03-07 DIAGNOSIS — IMO0002 Reserved for concepts with insufficient information to code with codable children: Secondary | ICD-10-CM

## 2013-03-09 ENCOUNTER — Ambulatory Visit (HOSPITAL_COMMUNITY): Payer: Medicare Other

## 2013-03-16 ENCOUNTER — Other Ambulatory Visit: Payer: Self-pay | Admitting: Internal Medicine

## 2013-03-22 ENCOUNTER — Telehealth: Payer: Self-pay | Admitting: *Deleted

## 2013-03-22 MED ORDER — ALPRAZOLAM 0.5 MG PO TABS: 0.5000 mg | ORAL_TABLET | Freq: Four times a day (QID) | ORAL | Status: DC | PRN

## 2013-03-22 NOTE — Telephone Encounter (Signed)
OK for refill with 5 additional refills.

## 2013-03-22 NOTE — Addendum Note (Signed)
Addended by: Dion BodyWOODBURY, Jhayla Podgorski on: 03/22/2013 01:20 PM   Modules accepted: Orders

## 2013-03-22 NOTE — Telephone Encounter (Signed)
Patient is requesting refill on Xanax. Patient last OV 12/12/2012. Last filled on 01/13/2013. Please advise.

## 2013-03-22 NOTE — Telephone Encounter (Signed)
Rx called in and patient notified.  

## 2013-05-22 ENCOUNTER — Other Ambulatory Visit: Payer: Self-pay | Admitting: *Deleted

## 2013-05-22 MED ORDER — FUROSEMIDE 40 MG PO TABS: 40.0000 mg | ORAL_TABLET | Freq: Every day | ORAL | Status: DC

## 2013-06-14 ENCOUNTER — Other Ambulatory Visit: Payer: Self-pay | Admitting: Internal Medicine

## 2013-06-14 ENCOUNTER — Ambulatory Visit (INDEPENDENT_AMBULATORY_CARE_PROVIDER_SITE_OTHER)
Admission: RE | Admit: 2013-06-14 | Discharge: 2013-06-14 | Disposition: A | Discharge status: Home / Self Care | Payer: Medicare Other | Source: Ambulatory Visit | Attending: Internal Medicine | Admitting: Internal Medicine

## 2013-06-14 ENCOUNTER — Encounter: Payer: Self-pay | Admitting: Internal Medicine

## 2013-06-14 ENCOUNTER — Telehealth: Payer: Self-pay | Admitting: Internal Medicine

## 2013-06-14 ENCOUNTER — Ambulatory Visit (INDEPENDENT_AMBULATORY_CARE_PROVIDER_SITE_OTHER): Payer: Medicare Other | Admitting: Internal Medicine

## 2013-06-14 VITALS — BP 120/76 | HR 68 | Temp 97.9°F | Resp 14 | Wt 102.6 lb

## 2013-06-14 DIAGNOSIS — R52 Pain, unspecified: Secondary | ICD-10-CM | POA: Diagnosis not present

## 2013-06-14 DIAGNOSIS — F17200 Nicotine dependence, unspecified, uncomplicated: Secondary | ICD-10-CM | POA: Diagnosis not present

## 2013-06-14 DIAGNOSIS — M949 Disorder of cartilage, unspecified: Secondary | ICD-10-CM | POA: Diagnosis not present

## 2013-06-14 DIAGNOSIS — M810 Age-related osteoporosis without current pathological fracture: Secondary | ICD-10-CM

## 2013-06-14 DIAGNOSIS — M899 Disorder of bone, unspecified: Secondary | ICD-10-CM | POA: Diagnosis not present

## 2013-06-14 DIAGNOSIS — J449 Chronic obstructive pulmonary disease, unspecified: Secondary | ICD-10-CM | POA: Diagnosis not present

## 2013-06-14 DIAGNOSIS — R1012 Left upper quadrant pain: Secondary | ICD-10-CM | POA: Diagnosis not present

## 2013-06-14 DIAGNOSIS — M546 Pain in thoracic spine: Secondary | ICD-10-CM

## 2013-06-14 MED ORDER — ALPRAZOLAM 0.5 MG PO TABS: 0.5000 mg | ORAL_TABLET | Freq: Four times a day (QID) | ORAL | Status: DC | PRN

## 2013-06-14 NOTE — Patient Instructions (Signed)
Your next office appointment will be determined based upon review of your pending x-rays. Those instructions will be transmitted to you by mail. 

## 2013-06-14 NOTE — Telephone Encounter (Signed)
Relevant patient education mailed to patient.  

## 2013-06-14 NOTE — Progress Notes (Signed)
   Subjective:    Patient ID: Sheena [name], female    DOB: 06/24/1945, 68 y.o.   MRN: [id number]  HPI Her pain began Friday evening as an "aching soreness" in LUQ, which she describes as radiating into her back. She states she heard a "crackle" as she arose from the bed She denies trauma or injury; reports the pain is positional, worsens with movement. She had some relief with Tramadol and Meloxicam.  She denies chest pain, palpitations, claudication.  On 02/16/13, T12 kyphoplasty with biopsy was performed by Dr. Corliss Skainseveshwar. Apparently on Evista previously but it caused sweats; no bisphosphonate therapy to date apparently She also describes a burning sensation in both legs, several times throughout the week, for the last 6 months. These episodes usually happen later in the day.    Review of Systems Denies significant dyspepsia, unexplained weight loss, abdominal pain, melena, rectal bleeding, or small caliber stools.  Change in stools such as diarrhea or constipation denied  Dysuria, pyuria, hematuria or flank pain are absent  No associated change in color or temperature of the skin in area of symptoms  No associated rash or skin lesions present  Abnormal bruising or bleeding not present     Objective:   Physical Exam General appearance:thin , somewhat chronically ill appearing; appears anxious.  Eyes: No conjunctival inflammation or scleral icterus is present.  Oral exam: poor dentition; black, furry tongue Heart: Normal rate and regular rhythm. 1.5-2/6 systolic murmur  Lungs:Chest clear to auscultation but decreased breath sounds.No increased work of breathing.  Abdomen: bowel sounds normal, soft and non-tender without masses, organomegaly or hernias noted. No guarding or rebound . No tenderness over the flanks to percussion  Musculoskeletal: Accentuated thoracic spine curvature. Able to lie flat and sit up with help. Negative straight leg raising bilaterally. Gait normal. DIP OA  changes Skin:Warm & dry. Punctate erythematous papule noted on L posterior chest . No vesicles.  Vasc:No edema; feet cool to touch. Cap refill <3 sec; pedal pulses decreased bilaterally. Decreased pedal pulses Lymphatic: No lymphadenopathy is noted about the head, neck, axilla areas.      Assessment & Plan:  #1 left upper quadrant and back pain; the most likely explanation is a new vertebral fracture at T7 or T8.  #2 peripheral vascular disease  #3 Smoking addiction; risk discussed  Plan: See orders

## 2013-06-14 NOTE — Progress Notes (Signed)
Pre visit review using our clinic review tool, if applicable. No additional management support is needed unless otherwise documented below in the visit note. 

## 2013-06-14 NOTE — Progress Notes (Signed)
   Subjective:    Patient ID: Sheena [name], female    DOB: Nov 16, 1945, 68 y.o.   MRN: [id number]  HPI  Her pain began Friday evening as an "aching soreness" in LUQ, which she describes as radiating into her back.  Significantly, she reports that earlier that morning she heard a pop sound while she was stretching.   She reports the pain is positional, worsens with movement. She had some relief with Tramadol and Meloxicam.  She denies chest pain, palpitations, claudication.   Her history is significant for osteoporosis and she is a current 1/2 ppd smoker.   On 02/16/13, T12 kyphoplasty with biopsy was performed by Dr. Corliss Skainseveshwar. Biopsy was benign per report reviewed.    She also describes a burning sensation in both legs, several times throughout the week, for the last 6 months. These episodes usually happen later in the day.   Review of Systems Denies significant  dyspepsia, unexplained weight loss, abdominal pain, melena, rectal bleeding, or small caliber stools. Change in stools such as diarrhea or constipation denied Dysuria, pyuria, hematuria or flank pain are absent  No associated change in color or temperature of the skin in area of symptoms No associated rash or skin lesions present Abnormal bruising or bleeding not present    Objective:   Physical Exam General appearance is one of good health; appears anxious.  Eyes: No conjunctival inflammation or scleral icterus is present.  Oral exam:    Heart:  Normal rate and regular rhythm. 1.5-2/6 systolic murmur  Lungs:Chest clear to auscultation: decreased breath sounds.No increased work of breathing.   Abdomen: bowel sounds normal, soft and non-tender without masses, organomegaly or hernias noted.  No guarding or rebound . No tenderness over the flanks to percussion  Musculoskeletal: Able to lie flat and sit up with help. Negative straight leg raising bilaterally. Gait normal.   Skin:Warm & dry. Shiny, erythematous area  noted on anterior aspect of left lower leg. No edema; feet cool to touch. Cap refill <3 sec; pedal pulses decreased bilaterally.  Lymphatic: No lymphadenopathy is noted about the head, neck, axilla, or inguinal areas.      Assessment & Plan:

## 2013-06-15 ENCOUNTER — Other Ambulatory Visit: Payer: Self-pay | Admitting: Internal Medicine

## 2013-06-15 DIAGNOSIS — M8008XA Age-related osteoporosis with current pathological fracture, vertebra(e), initial encounter for fracture: Secondary | ICD-10-CM

## 2013-06-15 MED ORDER — TRAMADOL HCL 50 MG PO TABS: 50.0000 mg | ORAL_TABLET | Freq: Two times a day (BID) | ORAL | Status: DC | PRN

## 2013-06-15 MED ORDER — MELOXICAM 15 MG PO TABS: 15.0000 mg | ORAL_TABLET | Freq: Every day | ORAL | Status: DC

## 2013-06-15 MED ORDER — CYCLOBENZAPRINE HCL 5 MG PO TABS: 5.0000 mg | ORAL_TABLET | Freq: Three times a day (TID) | ORAL | Status: DC | PRN

## 2013-06-15 NOTE — Telephone Encounter (Signed)
Dr Alwyn RenHopper   Is it okay to refill these meds? Please advise .

## 2013-06-15 NOTE — Telephone Encounter (Signed)
Make Tramadol #60; other 2 no change

## 2013-06-15 NOTE — Telephone Encounter (Signed)
Pt called back today stated that she forgot to ask for refill for Tramodol, cyclobenzaprine and meloxicam. Please advise. Pt had an appt yesterday with Dr. Alwyn RenHopper

## 2013-09-11 ENCOUNTER — Other Ambulatory Visit: Payer: Self-pay

## 2013-09-11 MED ORDER — METOPROLOL TARTRATE 50 MG PO TABS: ORAL_TABLET | ORAL | Status: DC

## 2013-11-07 ENCOUNTER — Other Ambulatory Visit: Payer: Self-pay | Admitting: Internal Medicine

## 2013-11-07 ENCOUNTER — Telehealth: Payer: Self-pay | Admitting: Family Medicine

## 2013-11-07 NOTE — Telephone Encounter (Signed)
Pt needs refills of the following:  metoprolol (LOPRESSOR) 50 MG tablet furosemide (LASIX) 40 MG tablet  Pt has an appt to establish with Dr. Durene Cal 02/19/14, however will not have enough med until then.  Pharmacy:  [facility] [address]

## 2013-11-08 MED ORDER — FUROSEMIDE 40 MG PO TABS: 40.0000 mg | ORAL_TABLET | Freq: Every day | ORAL | Status: DC

## 2013-11-08 MED ORDER — METOPROLOL TARTRATE 50 MG PO TABS: ORAL_TABLET | ORAL | Status: DC

## 2013-11-08 NOTE — Telephone Encounter (Signed)
Medications sent in.

## 2013-12-20 ENCOUNTER — Telehealth: Payer: Self-pay | Admitting: Family Medicine

## 2013-12-20 MED ORDER — METOPROLOL TARTRATE 50 MG PO TABS: ORAL_TABLET | ORAL | Status: DC

## 2013-12-20 NOTE — Telephone Encounter (Signed)
Pt request refill of the following: metoprolol (LOPRESSOR) 50 MG tablet  Pt said she takes the above medicine twice a day 1 at 10 am and another at 10pm    Pt need refill to last her until she see Dr Durene CalHunter in Dec    Phamacy: [facility] [address]

## 2013-12-20 NOTE — Telephone Encounter (Signed)
Medication refilled

## 2014-02-19 ENCOUNTER — Encounter: Payer: Self-pay | Admitting: Family Medicine

## 2014-02-19 ENCOUNTER — Ambulatory Visit (INDEPENDENT_AMBULATORY_CARE_PROVIDER_SITE_OTHER): Payer: Medicare Other | Admitting: Family Medicine

## 2014-02-19 VITALS — BP 160/82 | HR 72 | Temp 97.5°F | Wt 104.0 lb

## 2014-02-19 DIAGNOSIS — R42 Dizziness and giddiness: Secondary | ICD-10-CM

## 2014-02-19 DIAGNOSIS — Z720 Tobacco use: Secondary | ICD-10-CM

## 2014-02-19 DIAGNOSIS — F17200 Nicotine dependence, unspecified, uncomplicated: Secondary | ICD-10-CM

## 2014-02-19 DIAGNOSIS — Z87891 Personal history of nicotine dependence: Secondary | ICD-10-CM | POA: Insufficient documentation

## 2014-02-19 DIAGNOSIS — I10 Essential (primary) hypertension: Secondary | ICD-10-CM | POA: Diagnosis not present

## 2014-02-19 MED ORDER — METOPROLOL TARTRATE 50 MG PO TABS: ORAL_TABLET | ORAL | Status: DC

## 2014-02-19 MED ORDER — FUROSEMIDE 40 MG PO TABS: 40.0000 mg | ORAL_TABLET | Freq: Every day | ORAL | Status: DC

## 2014-02-19 NOTE — Assessment & Plan Note (Addendum)
Extensive workup in the past. The only thing the patient has not done so far is vestibular rehabilitation. I advised this today but patient states she wants to wait until she feels better. When I see patient back in 2 weeks to reevaluate blood pressure, I also plan on getting a PHQ9 as I am concerned that her depression may be the reason she is not getting out of the house instead of this disequilibrium.

## 2014-02-19 NOTE — Patient Instructions (Addendum)
Refilled blood pressure medicines. Top number high at 160. See me back 2-3 weeks for a recheck and may need to go up on medicine.   Start aspirin 81mg  on Mondays and Fridays and see how it does with your stomach  Hope you get to feeling better  Hopefully we can refer you back to rehab next visit for the feeling lightheaded

## 2014-02-19 NOTE — Assessment & Plan Note (Signed)
Poor control on metoprolol 50 mg twice a day, Lasix 40 mg once a day. Grade 2 diastolic dysfunction may be reason for Lasix that Dr. Debby BudNorins prescribed. Patient was very anxious to meet new physician today and also states she's been feeling poorly. We are going to recheck her blood pressure 2-3 weeks to see if any further adjustments need to be made. Previously well-controlled. Patient also states she took her medicine today but bottle is empty and she may have run out
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
## 2014-02-21 ENCOUNTER — Telehealth: Payer: Self-pay | Admitting: Family Medicine

## 2014-02-21 NOTE — Telephone Encounter (Signed)
I see where you put for pt to take ASA on Monday and Friday, but didn't mention anything about coated ASA. Is this ok?

## 2014-02-21 NOTE — Telephone Encounter (Signed)
Pt.notified

## 2014-02-21 NOTE — Telephone Encounter (Signed)
Pt was seen on 02/19/14 and got confused should she take asa 81 mg twice a wk or every day. Pt is also wondering what about coated asa can she take ? Please advise

## 2014-02-21 NOTE — Telephone Encounter (Signed)
Coated is fine

## 2014-03-05 ENCOUNTER — Encounter: Payer: Self-pay | Admitting: Family Medicine

## 2014-03-05 ENCOUNTER — Ambulatory Visit (INDEPENDENT_AMBULATORY_CARE_PROVIDER_SITE_OTHER): Payer: Medicare Other | Admitting: Family Medicine

## 2014-03-05 VITALS — BP 122/80 | HR 68 | Temp 97.6°F | Wt 102.0 lb

## 2014-03-05 DIAGNOSIS — E785 Hyperlipidemia, unspecified: Secondary | ICD-10-CM | POA: Diagnosis not present

## 2014-03-05 DIAGNOSIS — R42 Dizziness and giddiness: Secondary | ICD-10-CM

## 2014-03-05 DIAGNOSIS — I10 Essential (primary) hypertension: Secondary | ICD-10-CM

## 2014-03-05 DIAGNOSIS — N182 Chronic kidney disease, stage 2 (mild): Secondary | ICD-10-CM | POA: Insufficient documentation

## 2014-03-05 LAB — CBC
HCT: 41.4 % (ref 36.0–46.0)
Hemoglobin: 13.5 g/dL (ref 12.0–15.0)
MCHC: 32.6 g/dL (ref 30.0–36.0)
MCV: 98.1 fl (ref 78.0–100.0)
Platelets: 428 10*3/uL — ABNORMAL HIGH (ref 150.0–400.0)
RBC: 4.22 Mil/uL (ref 3.87–5.11)
RDW: 12.9 % (ref 11.5–15.5)
WBC: 10 10*3/uL (ref 4.0–10.5)

## 2014-03-05 LAB — LIPID PANEL
CHOLESTEROL: 277 mg/dL — AB (ref 0–200)
HDL: 56.1 mg/dL (ref >39.00)
NONHDL: 220.9
TRIGLYCERIDES: 203 mg/dL — AB (ref 0.0–149.0)
Total CHOL/HDL Ratio: 5
VLDL: 40.6 mg/dL — AB (ref 0.0–40.0)

## 2014-03-05 LAB — COMPREHENSIVE METABOLIC PANEL
ALBUMIN: 4.5 g/dL (ref 3.5–5.2)
ALT: 8 U/L (ref 0–35)
AST: 17 U/L (ref 0–37)
Alkaline Phosphatase: 95 U/L (ref 39–117)
BUN: 11 mg/dL (ref 6–23)
CALCIUM: 10 mg/dL (ref 8.4–10.5)
CO2: 26 meq/L (ref 19–32)
CREATININE: 0.9 mg/dL (ref 0.4–1.2)
Chloride: 98 mEq/L (ref 96–112)
GFR: 70.98 mL/min (ref >60.00)
Glucose, Bld: 104 mg/dL — ABNORMAL HIGH (ref 70–99)
Potassium: 4.9 mEq/L (ref 3.5–5.1)
SODIUM: 135 meq/L (ref 135–145)
TOTAL PROTEIN: 8.1 g/dL (ref 6.0–8.3)
Total Bilirubin: 0.4 mg/dL (ref 0.2–1.2)

## 2014-03-05 LAB — TSH: TSH: 1.41 u[IU]/mL (ref 0.35–4.50)

## 2014-03-05 LAB — LDL CHOLESTEROL, DIRECT: Direct LDL: 189.3 mg/dL

## 2014-03-05 NOTE — Patient Instructions (Signed)
Blood pressure looks much better today.   Offered rehab today but you would like to hold off for now.   You agreed to reconsider in 3 months.   You also declined the following:  Health Maintenance Due  Topic Date Due  . TETANUS/TDAP  07/13/1966  . MAMMOGRAM  07/12/1997  . COLONOSCOPY  07/12/1997  . ZOSTAVAX  07/13/2007  . DEXA SCAN  07/12/2012  . PNEUMOCOCCAL POLYSACCHARIDE VACCINE AGE 67 AND OVER  07/12/2012

## 2014-03-05 NOTE — Assessment & Plan Note (Signed)
Controlled on metoprolol  BID, lasix  once daily. Had been elevated last visit and this was likely due to anxiety of meeting new MD. Continue to monitor.

## 2014-03-05 NOTE — Assessment & Plan Note (Signed)
GFR 70. If progresses to less than 60, stop lasix and start ace-i or arb

## 2014-03-05 NOTE — Assessment & Plan Note (Signed)
Advised patient to trial vestibular rehab which reportedly had been recommended by ENT. She is not interested. Also offered repeat ENT referral-also not interested. She wants to follow up in 3 months and reconsider at that time.

## 2014-03-05 NOTE — Assessment & Plan Note (Signed)
Poor contorl with LDL nearly 190 in a smoker. Recommend starting statin-advised lipitor  daily-await patient response.

## 2014-03-05 NOTE — Progress Notes (Signed)
Tana Conch, MD Phone: [phone]  Subjective:   Sheena [name] is a 69 y.o. year old very pleasant female patient who presents with the following:  Disequilibrium-stable Not improved. Stable from last visit. Patient does not want to get out of the house due to this feeling. Also does not want to go to rehab. I was concerned about depression as cause. Especially with loss of son last year.  PHQ9 of 10 but no anhedonia. Feeling down is due to not getting out of the house-she wants to get out and shop.  ROS- no chest pain or shortness breath.   Hypertension-well controlled  CKD stage II- stable but not on ace-i BP Readings from Last 3 Encounters:  03/05/14 122/80  02/19/14 160/82  06/14/13 120/76  Home BP monitoring-no Compliant with medications-yes without side effects ROS-Denies any CP, HA, SOB, blurry vision, LE edema  Hyperlipidemia-poor control  On statin: no Regular exercise: no ROS- no chest pain or shortness of breath. No myalgias   Past Medical History- Patient Active Problem List   Diagnosis Date Noted  . Smoker 02/19/2014    Priority: High  . Disequilibrium 02/19/2014    Priority: High  . Hyperlipidemia 03/05/2014    Priority: Medium  . Vertebral compression fracture 01/04/2013    Priority: Medium  . Chronic low back pain 12/13/2012    Priority: Medium  . Essential hypertension, benign 09/03/2012    Priority: Medium  . Ataxia 04/01/2011    Priority: Medium  . Osteoporosis 03/21/2010    Priority: Medium  . MURMUR 03/21/2010    Priority: Medium  . Hyperglycemia 03/21/2010    Priority: Medium  . Sinusitis, chronic 09/03/2012    Priority: Low  . Anxiety 04/01/2011    Priority: Low  . Blurred vision 03/21/2010    Priority: Low  . CKD (chronic kidney disease), stage II 03/05/2014   Medications- reviewed and updated Current Outpatient Prescriptions  Medication Sig Dispense Refill  . aspirin 81 MG tablet Take 81 mg by mouth daily. Twice a week      . furosemide (LASIX) 40 MG tablet Take 1 tablet (40 mg total) by mouth daily. 30 tablet 5  . metoprolol (LOPRESSOR) 50 MG tablet TAKE 1 TABLET BY MOUTH TWICE A DAY 60 tablet 5  . acetaminophen (TYLENOL) 500 MG tablet Take 500 mg by mouth 2 (two) times daily as needed for mild pain.      Objective: BP 122/80 mmHg  Pulse 68  Temp(Src) 97.6 F (36.4 C)  Wt 102 lb (46.267 kg) Gen: NAD, resting comfortably, thin, appears older than stated age, smells of smoke CV:3/6 SEM at LUSB no rubs or gallops Lungs: CTAB no crackles, wheeze, rhonchi Abdomen: soft/nontender/nondistended/normal bowel sounds.  Ext: trace edema bilaterally Skin: warm, dry, no rash Neuro: stands and walks without difficulty, negative romberg, normal finger to nose   Assessment/Plan:  Disequilibrium Advised patient to trial vestibular rehab which reportedly had been recommended by ENT. She is not interested. Also offered repeat ENT referral-also not interested. She wants to follow up in 3 months and reconsider at that time.   Essential hypertension, benign Controlled on metoprolol  BID, lasix  once daily. Had been elevated last visit and this was likely due to anxiety of meeting new MD. Continue to monitor.   Hyperlipidemia Poor contorl with LDL nearly 190 in a smoker. Recommend starting statin-advised lipitor  daily-await patient response.   CKD (chronic kidney disease), stage II GFR 70. If progresses to less than 60, stop lasix and  start ace-i or arb   Return precautions advised. 3 month follow up planned.   fasting Results for orders placed or performed in visit on 03/05/14 (from the past 24 hour(s))  CBC     Status: Abnormal   Collection Time: 03/05/14  9:53 AM  Result Value Ref Range   WBC 10.0 4.0 - 10.5 K/uL   RBC 4.22 3.87 - 5.11 Mil/uL   Platelets 428.0 (H) 150.0 - 400.0 K/uL   Hemoglobin 13.5 12.0 - 15.0 g/dL   HCT 78.2 95.6 - 21.3 %   MCV 98.1 78.0 - 100.0 fl   MCHC 32.6 30.0 - 36.0  g/dL   RDW 08.6 57.8 - 46.9 %  Comprehensive metabolic panel     Status: Abnormal   Collection Time: 03/05/14  9:53 AM  Result Value Ref Range   Sodium 135 135 - 145 mEq/L   Potassium 4.9 3.5 - 5.1 mEq/L   Chloride 98 96 - 112 mEq/L   CO2 26 19 - 32 mEq/L   Glucose, Bld 104 (H) 70 - 99 mg/dL   BUN 11 6 - 23 mg/dL   Creatinine, Ser 0.9 0.4 - 1.2 mg/dL   Total Bilirubin 0.4 0.2 - 1.2 mg/dL   Alkaline Phosphatase 95 39 - 117 U/L   AST 17 0 - 37 U/L   ALT 8 0 - 35 U/L   Total Protein 8.1 6.0 - 8.3 g/dL   Albumin 4.5 3.5 - 5.2 g/dL   Calcium 62.9 8.4 - 52.8 mg/dL   GFR 41.32 >44.01 mL/min  Lipid panel     Status: Abnormal   Collection Time: 03/05/14  9:53 AM  Result Value Ref Range   Cholesterol 277 (H) 0 - 200 mg/dL   Triglycerides 027.2 (H) 0.0 - 149.0 mg/dL   HDL 53.66 >44.03 mg/dL   VLDL 47.4 (H) 0.0 - 25.9 mg/dL   Total CHOL/HDL Ratio 5    NonHDL 220.90   TSH     Status: None   Collection Time: 03/05/14  9:53 AM  Result Value Ref Range   TSH 1.41 0.35 - 4.50 uIU/mL  LDL cholesterol, direct     Status: None   Collection Time: 03/05/14  9:53 AM  Result Value Ref Range   Direct LDL 189.3 mg/dL  Recommend atorvastatin  daily, at risk diabetes, ckd stage ii

## 2014-03-06 ENCOUNTER — Telehealth: Payer: Self-pay

## 2014-03-06 ENCOUNTER — Telehealth: Payer: Self-pay | Admitting: Family Medicine

## 2014-03-06 MED ORDER — ATORVASTATIN CALCIUM 40 MG PO TABS: 40.0000 mg | ORAL_TABLET | Freq: Every day | ORAL | Status: DC

## 2014-03-06 NOTE — Telephone Encounter (Signed)
FYI: pt called back and stated that she changed her mind and would like to try the Atorvastatin. Medication sent in and pt aware.

## 2014-03-06 NOTE — Telephone Encounter (Signed)
Returned pt call with lab results

## 2014-03-06 NOTE — Telephone Encounter (Signed)
Pt returning your call. Please wait about 15 min before now before calling back. thanks

## 2014-07-19 ENCOUNTER — Other Ambulatory Visit: Payer: Self-pay | Admitting: Family Medicine

## 2014-07-19 DIAGNOSIS — Z1239 Encounter for other screening for malignant neoplasm of breast: Secondary | ICD-10-CM

## 2014-07-19 DIAGNOSIS — Z780 Asymptomatic menopausal state: Secondary | ICD-10-CM

## 2014-07-20 ENCOUNTER — Telehealth: Payer: Self-pay | Admitting: *Deleted

## 2014-07-20 NOTE — Telephone Encounter (Signed)
Left message for pt to call back.  Need to know if pt has had mammogram, when, and where

## 2014-07-20 NOTE — Telephone Encounter (Signed)
Pt has not had on many years.  Pt is having back issues (cracked vertebrae) and doesn't want to have a mammogram right now.

## 2014-08-30 ENCOUNTER — Other Ambulatory Visit: Payer: Self-pay

## 2014-08-30 ENCOUNTER — Telehealth: Payer: Self-pay | Admitting: Family Medicine

## 2014-08-30 MED ORDER — ATORVASTATIN CALCIUM 40 MG PO TABS: 40.0000 mg | ORAL_TABLET | Freq: Every day | ORAL | Status: DC

## 2014-08-30 MED ORDER — FUROSEMIDE 40 MG PO TABS: 40.0000 mg | ORAL_TABLET | Freq: Every day | ORAL | Status: DC

## 2014-08-30 MED ORDER — METOPROLOL TARTRATE 50 MG PO TABS: ORAL_TABLET | ORAL | Status: DC

## 2014-08-30 NOTE — Telephone Encounter (Signed)
Pt request refill atorvastatin (LIPITOR) 40 MG tablet Pt has only enough for the weekend  But pt states some days her legs hurt and sh can hardly walk. People have told her that this med can cause your legs to ache, and pt concerned . pls advise  AND  furosemide (LASIX) 40 MG tablet metoprolol (LOPRESSOR) 50 MG tablet 90 day supply [facility] [address]  Pt last seen 03/15/14. pls advise on when pt to fu.

## 2014-08-30 NOTE — Telephone Encounter (Signed)
May fill. Have her trial off for a week then restart every other day dosing. Journal symptoms and update us in a month or two on how she is feeling compared to now

## 2014-08-30 NOTE — Telephone Encounter (Signed)
Ok to refill since pt having side affects?

## 2014-08-31 NOTE — Telephone Encounter (Signed)
Pt called and notified of the below.

## 2015-03-06 ENCOUNTER — Encounter: Payer: Self-pay | Admitting: Family Medicine

## 2015-03-06 ENCOUNTER — Ambulatory Visit (INDEPENDENT_AMBULATORY_CARE_PROVIDER_SITE_OTHER): Payer: Medicare Other | Admitting: Family Medicine

## 2015-03-06 VITALS — BP 132/70 | HR 73 | Temp 97.9°F | Ht 60.0 in | Wt 106.0 lb

## 2015-03-06 DIAGNOSIS — R42 Dizziness and giddiness: Secondary | ICD-10-CM

## 2015-03-06 DIAGNOSIS — I5032 Chronic diastolic (congestive) heart failure: Secondary | ICD-10-CM | POA: Diagnosis not present

## 2015-03-06 DIAGNOSIS — E785 Hyperlipidemia, unspecified: Secondary | ICD-10-CM | POA: Diagnosis not present

## 2015-03-06 DIAGNOSIS — I519 Heart disease, unspecified: Secondary | ICD-10-CM | POA: Insufficient documentation

## 2015-03-06 DIAGNOSIS — I10 Essential (primary) hypertension: Secondary | ICD-10-CM | POA: Diagnosis not present

## 2015-03-06 DIAGNOSIS — I5189 Other ill-defined heart diseases: Secondary | ICD-10-CM | POA: Insufficient documentation

## 2015-03-06 MED ORDER — METOPROLOL TARTRATE 50 MG PO TABS: ORAL_TABLET | ORAL | Status: DC

## 2015-03-06 MED ORDER — ATORVASTATIN CALCIUM 20 MG PO TABS: 40.0000 mg | ORAL_TABLET | Freq: Every day | ORAL | Status: DC

## 2015-03-06 MED ORDER — FUROSEMIDE 40 MG PO TABS: 40.0000 mg | ORAL_TABLET | Freq: Every day | ORAL | Status: DC

## 2015-03-06 NOTE — Patient Instructions (Addendum)
Call me when the car is in better shape to be able to drive you to vestibular rehab- I think that is the best next step to take for your balance issues.   Come off cholesteorl medicine for 1 week then resume at lower dose 20mg  every other day to see how you do with it  Refilled fluid and blood pressure medicine  Update bloodwork in April when i see you. Come fasting.

## 2015-03-06 NOTE — Assessment & Plan Note (Signed)
S: continued chronic issues. Extensive workup in past. Remains worse with head movements. Has never had vestibular rehab A/P: agrees to vestibular rehab once her car is fixed. She will call me when this is done and we will order.

## 2015-03-06 NOTE — Progress Notes (Signed)
Tana Conch, MD  Subjective:  Sheena [name] is a 70 y.o. year old very pleasant female patient who presents for/with See problem oriented charting ROS- chronic lightheadedness worse with head movements, No chest pain or shortness of breath. No headache or blurry vision.   Past Medical History-  Patient Active Problem List   Diagnosis Date Noted  . Chronic diastolic CHF (congestive heart failure) (HCC) 03/06/2015    Priority: High  . Smoker 02/19/2014    Priority: High  . Disequilibrium 02/19/2014    Priority: High  . Hyperlipidemia 03/05/2014    Priority: Medium  . Vertebral compression fracture (HCC) 01/04/2013    Priority: Medium  . Chronic low back pain 12/13/2012    Priority: Medium  . Essential hypertension, benign 09/03/2012    Priority: Medium  . Ataxia 04/01/2011    Priority: Medium  . Osteoporosis 03/21/2010    Priority: Medium  . MURMUR 03/21/2010    Priority: Medium  . Hyperglycemia 03/21/2010    Priority: Medium  . Sinusitis, chronic 09/03/2012    Priority: Low  . Anxiety 04/01/2011    Priority: Low  . Blurred vision 03/21/2010    Priority: Low    Medications- reviewed and updated Current Outpatient Prescriptions  Medication Sig Dispense Refill  . acetaminophen (TYLENOL) 500 MG tablet Take 500 mg by mouth 2 (two) times daily as needed for mild pain.     Marland Kitchen aspirin 81 MG tablet Take 81 mg by mouth daily. Twice a week    . atorvastatin (LIPITOR) 40 MG tablet Take 1 tablet (40 mg total) by mouth daily. 30 tablet 5  . furosemide (LASIX) 40 MG tablet Take 1 tablet (40 mg total) by mouth daily. 30 tablet 5  . metoprolol (LOPRESSOR) 50 MG tablet TAKE 1 TABLET BY MOUTH TWICE A DAY 60 tablet 5   No current facility-administered medications for this visit.    Objective: BP 132/70 mmHg  Pulse 73  Temp(Src) 97.9 F (36.6 C) (Oral)  Ht 5' (1.524 m)  Wt 106 lb (48.081 kg)  BMI 20.70 kg/m2  SpO2 98% Gen: NAD, resting comfortably CV: RRR 3/6 Systolic murmur  rubs or gallops Lungs: CTAB no crackles, wheeze, rhonchi Abdomen: soft/nontender/nondistended/normal bowel sounds. No rebound or guarding.  Ext: no edema Skin: warm, dry, no rash Neuro: grossly normal, moves all extremities  Assessment/Plan:  Chronic diastolic CHF (congestive heart failure) (HCC) S: Not previously noted but reviewed echocardiogram today with grade II diastolic dysfunction in 2014 and reviewed history with patient. She states that she develops significant edema off lasix and thus was started by prior provider on lasix. Her weight has been relatively stable as well as her edema as long as she is on lasix.  A/P: continue current therapy with lasix 40mg  daily and recheck BMET at follow up.    Disequilibrium S: continued chronic issues. Extensive workup in past. Remains worse with head movements. Has never had vestibular rehab A/P: agrees to vestibular rehab once her car is fixed. She will call me when this is done and we will order.    Hyperlipidemia S: suspect reasonably controlled on atorvastatin 40mg  every other day but patient has intensified myalgias in legs on statin. History of some baseline leg pain associated with back pain. Originally did daily but did not tolerate, down to every other day myalgias improved some.  Lab Results  Component Value Date   CHOL 277* 03/05/2014   HDL 56.10 03/05/2014   LDLDIRECT 189.3 03/05/2014   TRIG 203.0* 03/05/2014  CHOLHDL 5 03/05/2014   A/P: I would prefer to try to keep her on statin so we will titrate down further to 20mg  atorvastatin every other day after taking 1 week off. High risk in patient with hypertension and smoking. Check lipids at follow up. Could also trial crestor as alternate.     Essential hypertension, benign S: controlled. On Metoprolol 50mg  BID, lasix 40mg  once daily  BP Readings from Last 3 Encounters:  03/06/15 132/70  03/05/14 122/80  02/19/14 160/82  A/P:Continue current meds:  refilled   April  follow up Declined pneumonia shot and flu shot Advised smoking cessation- once again declined Return precautions advised.   Meds ordered this encounter  Medications  . metoprolol (LOPRESSOR) 50 MG tablet    Sig: TAKE 1 TABLET BY MOUTH TWICE A DAY    Dispense:  60 tablet    Refill:  5  . furosemide (LASIX) 40 MG tablet    Sig: Take 1 tablet (40 mg total) by mouth daily.    Dispense:  30 tablet    Refill:  5  . atorvastatin (LIPITOR) 20 MG tablet    Sig: Take 2 tablets (40 mg total) by mouth daily.    Dispense:  30 tablet    Refill:  5

## 2015-03-06 NOTE — Assessment & Plan Note (Signed)
S: controlled. On Metoprolol 50mg  BID, lasix 40mg  once daily  BP Readings from Last 3 Encounters:  03/06/15 132/70  03/05/14 122/80  02/19/14 160/82  A/P:Continue current meds:  refilled

## 2015-03-06 NOTE — Assessment & Plan Note (Signed)
S: suspect reasonably controlled on atorvastatin 40mg  every other day but patient has intensified myalgias in legs on statin. History of some baseline leg pain associated with back pain. Originally did daily but did not tolerate, down to every other day myalgias improved some.  Lab Results  Component Value Date   CHOL 277* 03/05/2014   HDL 56.10 03/05/2014   LDLDIRECT 189.3 03/05/2014   TRIG 203.0* 03/05/2014   CHOLHDL 5 03/05/2014   A/P: I would prefer to try to keep her on statin so we will titrate down further to 20mg  atorvastatin every other day after taking 1 week off. High risk in patient with hypertension and smoking. Check lipids at follow up. Could also trial crestor as alternate.

## 2015-03-06 NOTE — Progress Notes (Signed)
Pre visit review using our clinic review tool, if applicable. No additional management support is needed unless otherwise documented below in the visit note. 

## 2015-03-06 NOTE — Assessment & Plan Note (Signed)
S: Not previously noted but reviewed echocardiogram today with grade II diastolic dysfunction in 2014 and reviewed history with patient. She states that she develops significant edema off lasix and thus was started by prior provider on lasix. Her weight has been relatively stable as well as her edema as long as she is on lasix.  A/P: continue current therapy with lasix 40mg  daily and recheck BMET at follow up.

## 2015-06-04 ENCOUNTER — Encounter: Payer: Self-pay | Admitting: Family Medicine

## 2015-06-04 ENCOUNTER — Ambulatory Visit (INDEPENDENT_AMBULATORY_CARE_PROVIDER_SITE_OTHER): Payer: Medicare Other | Admitting: Family Medicine

## 2015-06-04 ENCOUNTER — Other Ambulatory Visit: Payer: Self-pay | Admitting: Family Medicine

## 2015-06-04 VITALS — BP 118/72 | HR 67 | Temp 97.9°F | Wt 106.0 lb

## 2015-06-04 DIAGNOSIS — E785 Hyperlipidemia, unspecified: Secondary | ICD-10-CM | POA: Diagnosis not present

## 2015-06-04 DIAGNOSIS — I10 Essential (primary) hypertension: Secondary | ICD-10-CM

## 2015-06-04 DIAGNOSIS — R739 Hyperglycemia, unspecified: Secondary | ICD-10-CM

## 2015-06-04 DIAGNOSIS — I5032 Chronic diastolic (congestive) heart failure: Secondary | ICD-10-CM

## 2015-06-04 DIAGNOSIS — Z20828 Contact with and (suspected) exposure to other viral communicable diseases: Secondary | ICD-10-CM

## 2015-06-04 LAB — HEMOGLOBIN A1C: Hgb A1c MFr Bld: 5.8 % (ref 4.6–6.5)

## 2015-06-04 LAB — COMPREHENSIVE METABOLIC PANEL
ALBUMIN: 4.6 g/dL (ref 3.5–5.2)
ALK PHOS: 132 U/L — AB (ref 39–117)
ALT: 10 U/L (ref 0–35)
AST: 17 U/L (ref 0–37)
BILIRUBIN TOTAL: 0.5 mg/dL (ref 0.2–1.2)
BUN: 11 mg/dL (ref 6–23)
CALCIUM: 10.2 mg/dL (ref 8.4–10.5)
CO2: 30 meq/L (ref 19–32)
Chloride: 96 mEq/L (ref 96–112)
Creatinine, Ser: 0.84 mg/dL (ref 0.40–1.20)
GFR: 71.69 mL/min (ref >60.00)
Glucose, Bld: 103 mg/dL — ABNORMAL HIGH (ref 70–99)
Potassium: 4.9 mEq/L (ref 3.5–5.1)
Sodium: 133 mEq/L — ABNORMAL LOW (ref 135–145)
Total Protein: 7.7 g/dL (ref 6.0–8.3)

## 2015-06-04 LAB — POC URINALSYSI DIPSTICK (AUTOMATED)
BILIRUBIN UA: NEGATIVE
GLUCOSE UA: NEGATIVE
Ketones, UA: NEGATIVE
LEUKOCYTES UA: NEGATIVE
NITRITE UA: NEGATIVE
PH UA: 6.5
PROTEIN UA: NEGATIVE
Spec Grav, UA: 1.01
UROBILINOGEN UA: 0.2

## 2015-06-04 LAB — LIPID PANEL
CHOL/HDL RATIO: 3
CHOLESTEROL: 160 mg/dL (ref 0–200)
HDL: 52.2 mg/dL (ref >39.00)
LDL Cholesterol: 77 mg/dL (ref 0–99)
NonHDL: 108.28
TRIGLYCERIDES: 154 mg/dL — AB (ref 0.0–149.0)
VLDL: 30.8 mg/dL (ref 0.0–40.0)

## 2015-06-04 LAB — CBC
HCT: 38.8 % (ref 36.0–46.0)
HEMOGLOBIN: 12.9 g/dL (ref 12.0–15.0)
MCHC: 33.1 g/dL (ref 30.0–36.0)
MCV: 96.2 fl (ref 78.0–100.0)
PLATELETS: 391 10*3/uL (ref 150.0–400.0)
RBC: 4.04 Mil/uL (ref 3.87–5.11)
RDW: 13.3 % (ref 11.5–15.5)
WBC: 9.3 10*3/uL (ref 4.0–10.5)

## 2015-06-04 MED ORDER — ATORVASTATIN CALCIUM 20 MG PO TABS: 20.0000 mg | ORAL_TABLET | Freq: Every day | ORAL | Status: DC

## 2015-06-04 MED ORDER — FUROSEMIDE 40 MG PO TABS: 40.0000 mg | ORAL_TABLET | Freq: Every day | ORAL | Status: DC

## 2015-06-04 MED ORDER — METOPROLOL TARTRATE 50 MG PO TABS: ORAL_TABLET | ORAL | Status: DC

## 2015-06-04 NOTE — Patient Instructions (Addendum)
Labs before you leave. No changes in medication.   Declines pneumonia shot again  Declines vestib rehab  Declines lung cancer screening program- will consider at least.   Advised  Smoking cessation. She is not ready to quit.   Health Maintenance Due  Topic Date Due  . Hepatitis C Screening - today Jun 02, 1945  . MAMMOGRAM - Get mammogram schedule and have results sent to us at [phone]. [facility] handout.  07/12/1997  . DEXA SCAN - Stop by the front desk and schedule bone density if you change your mind on this.  07/12/2012

## 2015-06-04 NOTE — Assessment & Plan Note (Deleted)
S: suspect reasonably controlled on atorvastatin 20mg  daily- she is taking with only mild myalgias. tolerable Lab Results  Component Value Date   CHOL 277* 03/05/2014   HDL 56.10 03/05/2014   LDLDIRECT 189.3 03/05/2014   TRIG 203.0* 03/05/2014   CHOLHDL 5 03/05/2014   A/P: update lipids today, hopeful LDL less than 100 but without meds was 189 so may not be at goal- tolerating current dose so will not increase

## 2015-06-04 NOTE — Assessment & Plan Note (Signed)
S: poorly controlled on no medication in past atorv 40 daily and every other day myalgias. On 20mg  daily minimal myalgias.  Lab Results  Component Value Date   CHOL 277* 03/05/2014   HDL 56.10 03/05/2014   LDLDIRECT 189.3 03/05/2014   TRIG 203.0* 03/05/2014   CHOLHDL 5 03/05/2014   A/P: continue current dose as has bene only tolerable dose in regards to myalgias. 20mg  atorv. Follow up 6 months.

## 2015-06-04 NOTE — Progress Notes (Signed)
Tana Conch, MD  Subjective:  Sheena [name] is a 70 y.o. year old very pleasant female patient who presents for/with See problem oriented charting ROS- No chest pain or shortness of breath. No headache or blurry vision. Chronic dysequilibrium- continues to decline vestibular rehab.   Past Medical History-  Patient Active Problem List   Diagnosis Date Noted  . Chronic diastolic CHF (congestive heart failure) (HCC) 03/06/2015    Priority: High  . Smoker 02/19/2014    Priority: High  . Disequilibrium 02/19/2014    Priority: High  . Hyperlipidemia 03/05/2014    Priority: Medium  . Chronic low back pain 12/13/2012    Priority: Medium  . Essential hypertension, benign 09/03/2012    Priority: Medium  . Ataxia 04/01/2011    Priority: Medium  . Osteoporosis 03/21/2010    Priority: Medium  . MURMUR 03/21/2010    Priority: Medium  . Hyperglycemia 03/21/2010    Priority: Medium  . Vertebral compression fracture (HCC) 01/04/2013    Priority: Low  . Sinusitis, chronic 09/03/2012    Priority: Low  . Anxiety 04/01/2011    Priority: Low  . Blurred vision 03/21/2010    Priority: Low    Medications- reviewed and updated Current Outpatient Prescriptions  Medication Sig Dispense Refill  . aspirin 81 MG tablet Take 81 mg by mouth daily. Twice a week    . atorvastatin (LIPITOR) 20 MG tablet Take 1 tablet (20 mg total) by mouth daily. 90 tablet 3  . furosemide (LASIX) 40 MG tablet Take 1 tablet (40 mg total) by mouth daily. 90 tablet 3  . metoprolol (LOPRESSOR) 50 MG tablet TAKE 1 TABLET BY MOUTH TWICE A DAY 180 tablet 3  . acetaminophen (TYLENOL) 500 MG tablet Take 500 mg by mouth 2 (two) times daily as needed for mild pain. Reported on 06/04/2015     No current facility-administered medications for this visit.    Objective: BP 118/72 mmHg  Pulse 67  Temp(Src) 97.9 F (36.6 C)  Wt 106 lb (48.081 kg) Gen: NAD, resting comfortably CV: RRR 3/6 murmur mitral area Lungs: CTAB no  crackles, wheeze, rhonchi Abdomen: soft/nontender/nondistended/normal bowel sounds. thinExt: no edema Skin: warm, dry Neuro: grossly normal, moves all extremities, appears balanced when walking  Assessment/Plan:  Hyperlipidemia S: poorly controlled on no medication in past atorv 40 daily and every other day myalgias. On  daily minimal myalgias.  Lab Results  Component Value Date   CHOL 277* 03/05/2014   HDL 56.10 03/05/2014   LDLDIRECT 189.3 03/05/2014   TRIG 203.0* 03/05/2014   CHOLHDL 5 03/05/2014   A/P: continue current dose as has bene only tolerable dose in regards to myalgias.  atorv. Follow up 6 months.     Chronic diastolic CHF (congestive heart failure) (HCC) S: compliant with lasix. Known grade II diastolic dysfunction from 2014 echo. asymptomatic A/P: continue current dose of medication   Hypertension S: controlled on metoprolol 50 mg and lasix  once dialy BP Readings from Last 3 Encounters:  06/04/15 118/72  03/06/15 132/70  03/05/14 122/80  A/P: doing well- no changes.   Declines pneumonia shot again  Declines vestib rehab  Declines bone density  Declines lung cancer screening program- will consider at least.   Advised  Smoking cessation. She is not ready to quit.   6 months follow up. Low motivation for change. Accepts issues as "all due to aging". Return precautions advised.   Orders Placed This Encounter  Procedures  . CBC    [place]  .  Comprehensive metabolic panel    [facility]    Order Specific Question:  Has the patient fasted?    Answer:  No  . Lipid panel    [place]    Order Specific Question:  Has the patient fasted?    Answer:  No  . Hemoglobin A1c    [place]  . Hepatitis C antibody, reflex    [facility]  . POCT Urinalysis Dipstick (Automated)    Meds ordered this encounter  Medications  . atorvastatin (LIPITOR) 20 MG tablet    Sig: Take 1 tablet (20 mg total) by mouth daily.    Dispense:  90 tablet    Refill:  3  .  furosemide (LASIX) 40 MG tablet    Sig: Take 1 tablet (40 mg total) by mouth daily.    Dispense:  90 tablet    Refill:  3  . metoprolol (LOPRESSOR) 50 MG tablet    Sig: TAKE 1 TABLET BY MOUTH TWICE A DAY    Dispense:  180 tablet    Refill:  3

## 2015-06-04 NOTE — Assessment & Plan Note (Signed)
S: compliant with lasix. Known grade II diastolic dysfunction from 2014 echo. asymptomatic A/P: continue current dose of medication

## 2015-06-05 LAB — HEPATITIS C ANTIBODY: HCV Ab: NEGATIVE

## 2015-07-02 ENCOUNTER — Other Ambulatory Visit: Payer: Self-pay | Admitting: Family Medicine

## 2015-11-20 ENCOUNTER — Telehealth: Payer: Self-pay | Admitting: Family Medicine

## 2015-11-20 ENCOUNTER — Emergency Department (HOSPITAL_COMMUNITY)
Admission: EM | Admit: 2015-11-20 | Discharge: 2015-11-20 | Disposition: A | Discharge status: Home / Self Care | Payer: Medicare Other | Attending: Emergency Medicine | Admitting: Emergency Medicine

## 2015-11-20 ENCOUNTER — Encounter (HOSPITAL_COMMUNITY): Payer: Self-pay | Admitting: Emergency Medicine

## 2015-11-20 DIAGNOSIS — Y939 Activity, unspecified: Secondary | ICD-10-CM | POA: Insufficient documentation

## 2015-11-20 DIAGNOSIS — M62830 Muscle spasm of back: Secondary | ICD-10-CM | POA: Insufficient documentation

## 2015-11-20 DIAGNOSIS — I5032 Chronic diastolic (congestive) heart failure: Secondary | ICD-10-CM | POA: Diagnosis not present

## 2015-11-20 DIAGNOSIS — Y999 Unspecified external cause status: Secondary | ICD-10-CM | POA: Diagnosis not present

## 2015-11-20 DIAGNOSIS — M546 Pain in thoracic spine: Secondary | ICD-10-CM | POA: Diagnosis present

## 2015-11-20 DIAGNOSIS — Z7982 Long term (current) use of aspirin: Secondary | ICD-10-CM | POA: Diagnosis not present

## 2015-11-20 DIAGNOSIS — X509XXA Other and unspecified overexertion or strenuous movements or postures, initial encounter: Secondary | ICD-10-CM | POA: Diagnosis not present

## 2015-11-20 DIAGNOSIS — Y929 Unspecified place or not applicable: Secondary | ICD-10-CM | POA: Insufficient documentation

## 2015-11-20 DIAGNOSIS — I110 Hypertensive heart disease with heart failure: Secondary | ICD-10-CM | POA: Diagnosis not present

## 2015-11-20 DIAGNOSIS — F17210 Nicotine dependence, cigarettes, uncomplicated: Secondary | ICD-10-CM | POA: Diagnosis not present

## 2015-11-20 MED ORDER — METHOCARBAMOL 500 MG PO TABS
500.0000 mg | ORAL_TABLET | Freq: Once | ORAL | Status: AC
  Administered 2015-11-20: 500 mg via ORAL
  Filled 2015-11-20: qty 1

## 2015-11-20 MED ORDER — CYCLOBENZAPRINE HCL 5 MG PO TABS: 5.0000 mg | ORAL_TABLET | Freq: Two times a day (BID) | ORAL | 0 refills | Status: DC | PRN

## 2015-11-20 MED ORDER — TRAMADOL HCL 50 MG PO TABS: 50.0000 mg | ORAL_TABLET | Freq: Four times a day (QID) | ORAL | 0 refills | Status: DC | PRN

## 2015-11-20 MED ORDER — CYCLOBENZAPRINE HCL 10 MG PO TABS
5.0000 mg | ORAL_TABLET | Freq: Once | ORAL | Status: AC
  Administered 2015-11-20: 5 mg via ORAL
  Filled 2015-11-20: qty 1

## 2015-11-20 NOTE — ED Provider Notes (Signed)
[facility]-EMERGENCY DEPT Provider Note   CSN: [id number] Arrival date & time: 11/20/15  1446  History   Chief Complaint Chief Complaint  Patient presents with  . Back Pain    HPI Sheena [name] is a 70 y.o. female.  HPI  70 y.o. female with a hx of Vertebroplasty of T12 x 3 years ago, presents to the Emergency Department today complaining of back pain with onset last night. Pt states that she twisted the wrong direction in her bed and developed a muscle spasm on her right upper back muscle. Notes tylenol with minimal relief. Notes pain 10/10 and worse with movement. No fever. No CP/SOB/ABD pain. No loss of bowel or bladder function. No saddle anesthesia. No numbness/tingling. No injury to the area. No N/V. No other symptoms noted.   Past Medical History:  Diagnosis Date  . Anxiety   . Heart murmur   . Hypertension   . Scarlet fever with other complications childhood   "had to learn to work again" has had leg weakness    Patient Active Problem List   Diagnosis Date Noted  . Chronic diastolic CHF (congestive heart failure) (HCC) 03/06/2015  . Hyperlipidemia 03/05/2014  . Smoker 02/19/2014  . Disequilibrium 02/19/2014  . Vertebral compression fracture (HCC) 01/04/2013  . Chronic low back pain 12/13/2012  . Sinusitis, chronic 09/03/2012  . Essential hypertension, benign 09/03/2012  . Ataxia 04/01/2011  . Anxiety 04/01/2011  . Blurred vision 03/21/2010  . Osteoporosis 03/21/2010  . MURMUR 03/21/2010  . Hyperglycemia 03/21/2010    Past Surgical History:  Procedure Laterality Date  . KYPHOPLASTY     2014  . peridontal surgery      OB History    No data available       Home Medications    Prior to Admission medications   Medication Sig Start Date End Date Taking? Authorizing Provider  acetaminophen (TYLENOL) 500 MG tablet Take 500 mg by mouth 2 (two) times daily as needed for mild pain. Reported on 06/04/2015    Historical Provider, MD  aspirin 81 MG tablet Take 81 mg  by mouth daily. Twice a week    Historical Provider, MD  atorvastatin (LIPITOR) 20 MG tablet Take 1 tablet (20 mg total) by mouth daily. 06/04/15   [name], MD  furosemide (LASIX) 40 MG tablet Take 1 tablet (40 mg total) by mouth daily. 06/04/15   [name], MD  metoprolol (LOPRESSOR) 50 MG tablet TAKE ONE TABLET BY MOUTH TWICE DAILY 07/02/15   [name], MD    Family History Family History  Problem Relation Age of Onset  . Cancer Mother     breast, spine mets  . Hypertension Mother   . Heart disease Mother 59    CABG 4 vessel  . COPD Father     emphysema  . Heart disease Brother 41    MI    Social History Social History  Substance Use Topics  . Smoking status: Current Every Day Smoker    Packs/day: 1.00    Years: 45.00    Types: Cigarettes  . Smokeless tobacco: Never Used  . Alcohol use No     Allergies   Antihistamines, diphenhydramine-type; Aspirin; and Evista [raloxifene]   Review of Systems Review of Systems ROS reviewed and all are negative for acute change except as noted in the HPI.  Physical Exam Updated Vital Signs BP 178/70 (BP Location: Left Arm)   Pulse 79   Temp 98.3 F (36.8 C) (Oral)  Resp 20   SpO2 94%   Physical Exam  Constitutional: She is oriented to person, place, and time. Vital signs are normal. She appears well-developed and well-nourished.  HENT:  Head: Normocephalic.  Right Ear: Hearing normal.  Left Ear: Hearing normal.  Eyes: Conjunctivae and EOM are normal. Pupils are equal, round, and reactive to light.  Neck: Normal range of motion. Neck supple.  Cardiovascular: Normal rate, regular rhythm, normal heart sounds and intact distal pulses.   Pulmonary/Chest: Effort normal and breath sounds normal.  Abdominal: Soft.  Musculoskeletal:       Cervical back: Normal.       Thoracic back: Normal.       Lumbar back: Normal.  Right Thoracic musculature TTP and reproducible on exam. No erythema noted. Spinous process  non TTP. No deformities noted.   Neurological: She is alert and oriented to person, place, and time.  Skin: Skin is warm and dry.  Psychiatric: She has a normal mood and affect. Her speech is normal and behavior is normal. Thought content normal.  Nursing note and vitals reviewed.  ED Treatments / Results  Labs (all labs ordered are listed, but only abnormal results are displayed) Labs Reviewed - No data to display  EKG  EKG Interpretation None      Radiology No results found.  Procedures Procedures (including critical care time)  Medications Ordered in ED Medications  methocarbamol (ROBAXIN) tablet 500 mg (not administered)  cyclobenzaprine (FLEXERIL) tablet 5 mg (not administered)   Initial Impression / Assessment and Plan / ED Course  I have reviewed the triage vital signs and the nursing notes.  Pertinent labs & imaging results that were available during my care of the patient were reviewed by me and considered in my medical decision making (see chart for details).  Clinical Course   Final Clinical Impressions(s) / ED Diagnoses  I have reviewed the relevant previous healthcare records. I obtained HPI from historian. Patient discussed with supervising physician  ED Course:  Assessment: Patient is a 70yF with a hx of T12 Vertebroplasty x 3 years who presents to the ED with back pain with onset last night. Notes twisting awkwardly. Pain on right musculature. Describes as spasm. Hx same. Ran out of muscle relaxants. No neurological deficits appreciated. Patient is ambulatory. No warning symptoms of back pain including: fecal incontinence, urinary retention or overflow incontinence, night sweats, waking from sleep with back pain, unexplained fevers or weight loss, h/o cancer, IVDU, recent trauma. No concern for cauda equina, epidural abscess, or other serious cause of back pain. Conservative measures such as rest, ice/heat and pain medicine indicated with PCP follow-up if no  improvement with conservative management.   Disposition/Plan:  DC Home Additional Verbal discharge instructions given and discussed with patient.  Pt Instructed to f/u with PCP in the next week for evaluation and treatment of symptoms. Return precautions given Pt acknowledges and agrees with plan  Supervising Physician Derwood KaplanAnkit Nanavati, MD   Final diagnoses:  Back muscle spasm    New Prescriptions New Prescriptions   No medications on file     Audry Piliyler Genine Beckett, PA-C 11/20/15 2015    Derwood KaplanAnkit Nanavati, MD 11/21/15 [id number]

## 2015-11-20 NOTE — ED Triage Notes (Signed)
Pt reports history of back surgery 3 years ago. States yesterday began having severe pain. Denies recent injury. Pt ambulatory in triage. Denies N/V/diarrhea/dysuria. VSS.

## 2015-11-20 NOTE — Discharge Instructions (Signed)
Please read and follow all provided instructions.  Your diagnoses today include:  1. Back muscle spasm     Tests performed today include: Vital signs - see below for your results today  Medications prescribed:   Take any prescribed medications only as directed.  Home care instructions:  Follow any educational materials contained in this packet Please rest, use ice or heat on your back for the next several days Do not lift, push, pull anything more than 10 pounds for the next week  Follow-up instructions: Please follow-up with your primary care provider in the next 1 week for further evaluation of your symptoms.   Return instructions:  SEEK IMMEDIATE MEDICAL ATTENTION IF YOU HAVE: New numbness, tingling, weakness, or problem with the use of your arms or legs Severe back pain not relieved with medications Loss control of your bowels or bladder Increasing pain in any areas of the body (such as chest or abdominal pain) Shortness of breath, dizziness, or fainting.  Worsening nausea (feeling sick to your stomach), vomiting, fever, or sweats Any other emergent concerns regarding your health   Additional Information:  Your vital signs today were: BP 178/70 (BP Location: Left Arm)    Pulse 79    Temp 98.3 F (36.8 C) (Oral)    Resp 20    SpO2 94%  If your blood pressure (BP) was elevated above 135/85 this visit, please have this repeated by your doctor within one month. --------------

## 2015-11-20 NOTE — Telephone Encounter (Signed)
Called and left voicemail for pt to return call to office.  

## 2015-11-20 NOTE — Telephone Encounter (Signed)
[facility] - Client  TELEPHONE ADVICE RECORD   [facility]    --------------------------------------------------------------------------------   Patient Name: Sheena [name]  Gender: Female  DOB: November 08, 1945   Age: 70 Y 4 M 7 D  Return Phone Number: [phone] (Primary)  Address:     City/State/Zip: [place]  [address]   Client [facility] - Client  Client Site [facility] Day  Physician Tana Conch - MD  Contact Type Call  Who Is Calling Patient / Member / Family / Caregiver  Call Type Triage / Clinical  Relationship To Patient Self  Return Phone Number [phone] (Primary)  Chief Complaint Back Pain - General  Reason for Call Symptomatic / Request for Health Information  Initial Comment Caller states she pulled something in her back last night. She had back surgery 3 years ago. She has muscle relaxers and wants to know if she can take them from 3 years ago?  Appointment Disposition EMR Appointment Not Necessary  Info pasted into Epic Yes  PreDisposition Did not know what to do  Translation No       Nurse Assessment  Nurse: Harlon Flor, RN, Darl Pikes Date/Time (Eastern Time): 11/20/2015 1:48:49 PM  Confirm and document reason for call. If symptomatic, describe symptoms. You must click the next button to save text entered. ---Caller states she pulled something in her back last night. She had back surgery 3 years ago. She has muscle relaxers and wants to know if she can take them from 3 years ago? In the thoracic spine. she does not know what she did to cause the pain. pain level is 10/10 she has urinated. she cannot lift both arms at same time    Has the patient traveled out of the country within the last 30 days? ---No    Does the patient have any new or worsening symptoms? ---Yes    Will a triage be completed? ---Yes    Related visit to physician within the last 2 weeks? ---No     Does the PT have any chronic conditions? (i.e. diabetes, asthma, etc.) ---Yes    List chronic conditions. ---HTN, back problems fluid pill and cholesterol    Is this a behavioral health or substance abuse call? ---No           Guidelines          Guideline Title Affirmed Question Affirmed Notes Nurse Date/Time Lamount Cohen Time)  Chest Pain Difficulty breathing    Harlon Flor, RN, Darl Pikes 11/20/2015 1:51:46 PM    Disp. Time Lamount Cohen Time) Disposition Final User         11/20/2015 1:54:06 PM Go to ED Now Yes Harlon Flor, RN, Helane Rima Understands: Yes  Disagree/Comply: Comply       Care Advice Given Per Guideline        GO TO ED NOW: You need to be seen in the Emergency Department. Go to the ER at _________[facility] Hospital. Leave now. Drive carefully. * Another adult should drive. NOTE TO TRIAGER - DRIVING: * Please bring a list of your current medicines when you go to the Emergency Department (ER). BRING MEDICINES: NOTHING BY MOUTH: Do not eat or drink anything for now. CARE ADVICE given per Chest Pain (Adult) guideline.        --------------------------------------------------------------------------------            Referrals  [facility] - ED  [facility] - ED

## 2015-11-21 NOTE — Telephone Encounter (Signed)
LVM for pt to call back as soon as possible.   

## 2015-11-21 NOTE — Telephone Encounter (Signed)
Please Advise:  Seen in the Ed yesterday

## 2015-11-21 NOTE — Telephone Encounter (Signed)
Better to pick up the new pills they printed for her last night- we can resend in the flexeril if she needs it as well

## 2015-11-22 MED ORDER — TRAMADOL HCL 50 MG PO TABS
50.0000 mg | ORAL_TABLET | Freq: Four times a day (QID) | ORAL | 0 refills | Status: DC | PRN
Start: 2015-11-22 — End: 2015-11-26

## 2015-11-22 MED ORDER — CYCLOBENZAPRINE HCL 5 MG PO TABS: 5.0000 mg | ORAL_TABLET | Freq: Two times a day (BID) | ORAL | 0 refills | Status: DC | PRN

## 2015-11-22 NOTE — Telephone Encounter (Signed)
Please see message and advise 

## 2015-11-22 NOTE — Telephone Encounter (Signed)
Pt states they only gave her 7 tabs of the  traMADol (ULTRAM) 50 MG tablet  20 cyclobenzaprine (FLEXERIL) 5 MG tablet  Pt would like a refill of both [facility] [address]

## 2015-11-22 NOTE — Telephone Encounter (Signed)
Yes thanks, may provide #30 of both and advise follow up with me to recheck if continued issues when she gets through those meds

## 2015-11-22 NOTE — Telephone Encounter (Signed)
Spoke to pt, told her Rx's for Flexeril and Tramadol were called into pharmacy # 30 and Dr.Hunter would like you to follow up with office visit if continued issues when through with medication. Pt verbalized understanding.

## 2015-11-25 ENCOUNTER — Telehealth: Payer: Self-pay | Admitting: Family Medicine

## 2015-11-25 NOTE — Telephone Encounter (Signed)
Called and scheduled patient for 11/26/15 @ 11:15. Offered an afternoon appointment for today but was declined per patient.

## 2015-11-25 NOTE — Telephone Encounter (Signed)
Needs office visit.

## 2015-11-25 NOTE — Telephone Encounter (Signed)
[facility] - Client TELEPHONE ADVICE RECORD [facility] Patient Name: Sheena MouseDONNA Pupo DOB: 09-14-1945 Initial Comment Caller states wife still has severe back pain; pain meds are not working; has bone disease; had back surgery few years ago; pulled something in her back last week; was in ED last week. Cannot take anything w/codeine. Nurse Assessment Nurse: Debera Latalston, RN, Tinnie GensJeffrey Date/Time Lamount Cohen(Eastern Time): 11/25/2015 11:58:44 AM Confirm and document reason for call. If symptomatic, describe symptoms. You must click the next button to save text entered. ---Caller states wife still has severe back pain; pain meds are not working; has bone disease; had back surgery few years ago; pulled something in her back last week; was in ED last week. Cannot take anything w/codeine. Is currently taking Tramadol HCL 50 mg q 6hrs PRN and cyclobenzaprine 5 mg bid. Seen in ED last Friday. Has the patient traveled out of the country within the last 30 days? ---No Does the patient have any new or worsening symptoms? ---Yes Will a triage be completed? ---Yes Related visit to physician within the last 2 weeks? ---No Does the PT have any chronic conditions? (i.e. diabetes, asthma, etc.) ---Yes List chronic conditions. ---Chronic back issues and hx of kyphoplasty and osteoporosis. Is this a behavioral health or substance abuse call? ---No Guidelines Guideline Title Affirmed Question Affirmed Notes Back Pain [1] SEVERE back pain (e.g., excruciating, unable to do any normal activities) AND [2] not improved 2 hours after pain medicine Final Disposition User See Physician within 4 Hours (or PCP triage) Debera Latalston, RN, Tinnie GensJeffrey Comments Caller did not want to come in for appointment. Requested that PCP call in different pain medication. Referrals REFERRED TO PCP OFFICE GO TO FACILITY REFUSED Disagree/Comply: Comply

## 2015-11-26 ENCOUNTER — Ambulatory Visit (INDEPENDENT_AMBULATORY_CARE_PROVIDER_SITE_OTHER)
Admission: RE | Admit: 2015-11-26 | Discharge: 2015-11-26 | Disposition: A | Discharge status: Home / Self Care | Payer: Medicare Other | Source: Ambulatory Visit | Attending: Family Medicine | Admitting: Family Medicine

## 2015-11-26 ENCOUNTER — Encounter: Payer: Self-pay | Admitting: Family Medicine

## 2015-11-26 ENCOUNTER — Ambulatory Visit (INDEPENDENT_AMBULATORY_CARE_PROVIDER_SITE_OTHER): Payer: Medicare Other | Admitting: Family Medicine

## 2015-11-26 ENCOUNTER — Other Ambulatory Visit: Payer: Self-pay | Admitting: Family Medicine

## 2015-11-26 VITALS — BP 142/70 | HR 63 | Temp 97.7°F | Wt 106.6 lb

## 2015-11-26 DIAGNOSIS — M549 Dorsalgia, unspecified: Secondary | ICD-10-CM

## 2015-11-26 DIAGNOSIS — S22000A Wedge compression fracture of unspecified thoracic vertebra, initial encounter for closed fracture: Secondary | ICD-10-CM

## 2015-11-26 DIAGNOSIS — M542 Cervicalgia: Secondary | ICD-10-CM | POA: Diagnosis not present

## 2015-11-26 MED ORDER — TRAMADOL HCL 50 MG PO TABS: 50.0000 mg | ORAL_TABLET | Freq: Four times a day (QID) | ORAL | 1 refills | Status: DC | PRN

## 2015-11-26 MED ORDER — CYCLOBENZAPRINE HCL 5 MG PO TABS: 5.0000 mg | ORAL_TABLET | Freq: Two times a day (BID) | ORAL | 0 refills | Status: DC | PRN

## 2015-11-26 NOTE — Progress Notes (Signed)
Subjective:  Sheena MouseDonna [name] is a 70 y.o. year old very pleasant female patient who presents for/with See problem oriented charting ROS- no leg weakness, no fecal or urinary incontinence, no saddle anesthesia.see any ROS included in HPI as well.   Past Medical History-  Patient Active Problem List   Diagnosis Date Noted  . Chronic diastolic CHF (congestive heart failure) (HCC) 03/06/2015    Priority: High  . Smoker 02/19/2014    Priority: High  . Disequilibrium 02/19/2014    Priority: High  . Hyperlipidemia 03/05/2014    Priority: Medium  . Chronic low back pain 12/13/2012    Priority: Medium  . Essential hypertension, benign 09/03/2012    Priority: Medium  . Ataxia 04/01/2011    Priority: Medium  . Osteoporosis 03/21/2010    Priority: Medium  . MURMUR 03/21/2010    Priority: Medium  . Hyperglycemia 03/21/2010    Priority: Medium  . Vertebral compression fracture (HCC) 01/04/2013    Priority: Low  . Sinusitis, chronic 09/03/2012    Priority: Low  . Anxiety 04/01/2011    Priority: Low  . Blurred vision 03/21/2010    Priority: Low    Medications- reviewed and updated Current Outpatient Prescriptions  Medication Sig Dispense Refill  . acetaminophen (TYLENOL) 500 MG tablet Take 500 mg by mouth 2 (two) times daily as needed for mild pain. Reported on 06/04/2015    . aspirin 81 MG tablet Take 81 mg by mouth daily. Twice a week    . atorvastatin (LIPITOR) 20 MG tablet Take 1 tablet (20 mg total) by mouth daily. 90 tablet 3  . cyclobenzaprine (FLEXERIL) 5 MG tablet Take 1 tablet (5 mg total) by mouth 2 (two) times daily as needed for muscle spasms. 30 tablet 0  . furosemide (LASIX) 40 MG tablet Take 1 tablet (40 mg total) by mouth daily. 90 tablet 3  . metoprolol (LOPRESSOR) 50 MG tablet TAKE ONE TABLET BY MOUTH TWICE DAILY 180 tablet 3  . traMADol (ULTRAM) 50 MG tablet Take 1 tablet (50 mg total) by mouth every 6 (six) hours as needed. 30 tablet 1   No current  facility-administered medications for this visit.     Objective: BP (!) 142/70 (BP Location: Left Arm, Patient Position: Sitting, Cuff Size: Normal)   Pulse 63   Temp 97.7 F (36.5 C) (Oral)   Wt 106 lb 9.6 oz (48.4 kg)   SpO2 96%   BMI 20.82 kg/m  Gen: NAD, resting comfortably CV: RRR no murmurs rubs or gallops Lungs: CTAB no crackles, wheeze, rhonchi MSK: no midline pain but just to right of midline in right thoracic area with reported severe pain Ext: no edema Skin: warm, dry, no rash Neuro: grossly normal, moves all extremities, normal strength in legs for patient though 5-/5 as thin.    Assessment/Plan:  Compression fracture thoracic  S: Patient seen in ED recently and diagnosed with muscle spasm of mid back and given tramadol and flexeril. Does not feel flexeril helps very much but does help some and tramadol helps but seems to wear off (takes about twice a day). She does have a historyt12 vertebroplasty several years ago and denies fall or injury at that time. This feels similar except slightly off to the right and has 8/10 pain. Heating pad helps some. Takes tylenol as well a few hours after taking tramadol and flexeril A/P: with prior compression fracture history opted to get repeat films today and new compression fracture noted. Conservative care with meds given today  is reasonable but iif she increases medicine to q6 hours and still poor contorl may have to do calcitonin. In addition, have asked nursing staff to encourage bone density for baseline but likely weill need fosamax. Continue heating pad  Meds ordered this encounter  Medications  . cyclobenzaprine (FLEXERIL) 5 MG tablet    Sig: Take 1 tablet (5 mg total) by mouth 2 (two) times daily as needed for muscle spasms.    Dispense:  30 tablet    Refill:  0  . traMADol (ULTRAM) 50 MG tablet    Sig: Take 1 tablet (50 mg total) by mouth every 6 (six) hours as needed.    Dispense:  30 tablet    Refill:  1   New acute  issue in our office with medication management in high risk patient with age and likely underlying osteoporosis  Return precautions advised.  Tana Conch, MD

## 2015-11-26 NOTE — Patient Instructions (Signed)
May take tramadol up to every 6 hours- for first few days may want to take at this level but as soon as able- start to space out/take less  Take flexeril twice a day  [facility] given history compression fracture  If there is a fracture- will definitely want to get bone density

## 2015-11-26 NOTE — Progress Notes (Signed)
Pre visit review using our clinic review tool, if applicable. No additional management support is needed unless otherwise documented below in the visit note. 

## 2015-11-27 ENCOUNTER — Other Ambulatory Visit: Payer: Self-pay

## 2015-11-27 DIAGNOSIS — M810 Age-related osteoporosis without current pathological fracture: Secondary | ICD-10-CM

## 2015-11-27 DIAGNOSIS — IMO0002 Reserved for concepts with insufficient information to code with codable children: Secondary | ICD-10-CM

## 2015-11-29 ENCOUNTER — Telehealth: Payer: Self-pay | Admitting: Family Medicine

## 2015-11-29 NOTE — Telephone Encounter (Signed)
° ° °  Pt call to say she is still the same,said she having some sinus drainage

## 2015-12-02 NOTE — Telephone Encounter (Signed)
Is she calling because wants to try the calcitonin?

## 2015-12-03 ENCOUNTER — Ambulatory Visit: Payer: Medicare Other | Admitting: Family Medicine

## 2015-12-04 NOTE — Telephone Encounter (Signed)
Called an left a voicemail for a return phone call

## 2015-12-06 NOTE — Telephone Encounter (Signed)
Spoke with patient who stated her back pain is gradually improving and she is thinking about cutting back on pain meds. She states she now has a cough. She is not taking any cough meds at this time.

## 2015-12-24 ENCOUNTER — Telehealth: Payer: Self-pay | Admitting: Family Medicine

## 2015-12-24 MED ORDER — CYCLOBENZAPRINE HCL 5 MG PO TABS: 5.0000 mg | ORAL_TABLET | Freq: Two times a day (BID) | ORAL | 0 refills | Status: DC | PRN

## 2015-12-24 MED ORDER — TRAMADOL HCL 50 MG PO TABS: 50.0000 mg | ORAL_TABLET | Freq: Four times a day (QID) | ORAL | 2 refills | Status: DC | PRN

## 2015-12-24 NOTE — Telephone Encounter (Signed)
Medication refilled for patient. 

## 2015-12-24 NOTE — Telephone Encounter (Signed)
Pt request refill  traMADol (ULTRAM) 50 MG tablet cyclobenzaprine (FLEXERIL) 5 MG tablet  Pt states she has been in more pain lately.  [facility] [address]

## 2015-12-24 NOTE — Telephone Encounter (Signed)
Yes thanks, #30 with 2 refills- encourage follow up before next refill

## 2015-12-25 ENCOUNTER — Telehealth: Payer: Self-pay

## 2015-12-25 NOTE — Telephone Encounter (Signed)
Received [facility] from [facility] for Cyclobenzaprine 5 mg tablets. PA submitted & is pending. Key: [id number]

## 2015-12-26 NOTE — Telephone Encounter (Signed)
PA approved. Form faxed back to pharmacy. 

## 2016-02-04 ENCOUNTER — Ambulatory Visit (INDEPENDENT_AMBULATORY_CARE_PROVIDER_SITE_OTHER)
Admission: RE | Admit: 2016-02-04 | Discharge: 2016-02-04 | Disposition: A | Discharge status: Home / Self Care | Payer: Medicare Other | Source: Ambulatory Visit | Attending: Family Medicine | Admitting: Family Medicine

## 2016-02-04 DIAGNOSIS — M810 Age-related osteoporosis without current pathological fracture: Secondary | ICD-10-CM | POA: Diagnosis not present

## 2016-02-06 ENCOUNTER — Other Ambulatory Visit: Payer: Self-pay | Admitting: Family Medicine

## 2016-03-13 ENCOUNTER — Ambulatory Visit: Payer: Medicare Other | Admitting: Family Medicine

## 2016-03-18 ENCOUNTER — Ambulatory Visit: Payer: Medicare Other | Admitting: Family Medicine

## 2016-03-25 ENCOUNTER — Other Ambulatory Visit: Payer: Self-pay | Admitting: Family Medicine

## 2016-03-31 ENCOUNTER — Ambulatory Visit: Payer: Medicare Other | Admitting: Family Medicine

## 2016-04-07 ENCOUNTER — Ambulatory Visit (INDEPENDENT_AMBULATORY_CARE_PROVIDER_SITE_OTHER): Payer: Medicare Other

## 2016-04-07 VITALS — BP 128/70 | HR 72 | Ht 60.0 in | Wt 102.0 lb

## 2016-04-07 DIAGNOSIS — Z0000 Encounter for general adult medical examination without abnormal findings: Secondary | ICD-10-CM | POA: Diagnosis not present

## 2016-04-07 NOTE — Progress Notes (Signed)
I have reviewed and agree with note, evaluation, plan. Patient prefers not to leave her house and often comes up for reasons to avoid anything that takes her out of house like mammogram- will follow up at our visit about this and hopeful she is willing to do the cologuard  Tana ConchStephen Hunter, MD

## 2016-04-07 NOTE — Patient Instructions (Addendum)
Sheena [name] , Thank you for taking time to come for your Medicare Wellness Visit. I appreciate your ongoing commitment to your health goals. Please review the following plan we discussed and let me know if I can assist you in the future.   Will have your mammogram!! (when back is feeling better )  Will schedule an eye exam   Prevention of falls: Remove rugs or any tripping hazards in the home Use Non slip mats in bathtubs and showers Placing grab bars next to the toilet and or shower Placing handrails on both sides of the stair way Adding extra lighting in the home.   Personal safety issues reviewed:  1. Consider starting a community watch program per [facility] 2.  Changes batteries is smoke detector and/or carbon monoxide detector  3.  If you have firearms; keep them in a safe place 4.  Wear protection when in the sun; Always wear sunscreen or a hat; It is good to have your doctor check your skin annually or review any new areas of concern    Learn about the Yellow Dot program:  The program allows first responders at your emergency to have access to who your physician is, as well as your medications and medical conditions.  Citizens requesting the Yellow Dot Packages should contact Master Corporal Nunzio Cobbs at the [facility] [phone] for the first week of the program and beginning the week after Easter citizens should contact their Scientist, physiological.    Therefore, Medicare Part B will cover the CologuardTM test once every three years for beneficiaries who meet all of the following criteria:  Age 39 to 45 years,  Asymptomatic (no signs or symptoms of colorectal disease including but not limited to lower gastrointestinal pain, blood in stool, positive guaiac fecal occult blood test or fecal immunochemical test), and  At average risk of developing colorectal cancer (no personal history of adenomatous polyps, colorectal cancer, or  inflammatory bowel disease, including Crohn's Disease and ulcerative colitis; no family history of colorectal cancers or adenomatous polyps, familial adenomatous polyposis, or hereditary nonpolyposis colorectal cancer).   These are the goals we discussed: Goals    . patient          Loves to move more but back is stopping you now. Will try to get out more when feeling better     . Quit smoking / using tobacco          Smoking;  Educated to avoid secondary smoke Smoking cessation in [place]: [place] at [phone] -day Smoking cessation in [place]: Evening; [phone] Smoking cessation at [facility]: [phone] Smoking cessation at [facility]: [phone] Classes offered a couple of times a month;  Will work with the patient as far as registration and location  Meds may help; chatix (Varenicline); Zyban (Bupropion SR); Nicotine Replacement (gum; lozenges; patches; etc.)   30 pack yr smoking hx: Educated regarding LDCT; To discuss with MD at next fup. Also educated on AAA screening for men 65-75 who have smoked         This is a list of the screening recommended for you and due dates:  Health Maintenance  Topic Date Due  . Mammogram  07/12/1997  . Flu Shot  02/19/2034*  . Colon Cancer Screening  06/03/2065*  . Shingles Vaccine  06/03/2065*  . Tetanus Vaccine  06/03/2065*  . Pneumonia vaccines (1 of 2 - PCV13) 06/03/2065*  . DEXA scan (bone density measurement)  Completed  .  Hepatitis C: One time screening is recommended by Center for Disease Control  (CDC) for  adults born from [birth year] through 1963.   Completed  *Topic was postponed. The date shown is not the original due date.        Fall Prevention in the Home Introduction Falls can cause injuries. They can happen to people of all ages. There are many things you can do to make your home safe and to help prevent falls. What can I do on the outside of my home?  Regularly fix the edges of walkways and  driveways and fix any cracks.  Remove anything that might make you trip as you walk through a door, such as a raised step or threshold.  Trim any bushes or trees on the path to your home.  Use bright outdoor lighting.  Clear any walking paths of anything that might make someone trip, such as rocks or tools.  Regularly check to see if handrails are loose or broken. Make sure that both sides of any steps have handrails.  Any raised decks and porches should have guardrails on the edges.  Have any leaves, snow, or ice cleared regularly.  Use sand or salt on walking paths during winter.  Clean up any spills in your garage right away. This includes oil or grease spills. What can I do in the bathroom?  Use night lights.  Install grab bars by the toilet and in the tub and shower. Do not use towel bars as grab bars.  Use non-skid mats or decals in the tub or shower.  If you need to sit down in the shower, use a plastic, non-slip stool.  Keep the floor dry. Clean up any water that spills on the floor as soon as it happens.  Remove soap buildup in the tub or shower regularly.  Attach bath mats securely with double-sided non-slip rug tape.  Do not have throw rugs and other things on the floor that can make you trip. What can I do in the bedroom?  Use night lights.  Make sure that you have a light by your bed that is easy to reach.  Do not use any sheets or blankets that are too big for your bed. They should not hang down onto the floor.  Have a firm chair that has side arms. You can use this for support while you get dressed.  Do not have throw rugs and other things on the floor that can make you trip. What can I do in the kitchen?  Clean up any spills right away.  Avoid walking on wet floors.  Keep items that you use a lot in easy-to-reach places.  If you need to reach something above you, use a strong step stool that has a grab bar.  Keep electrical cords out of the  way.  Do not use floor polish or wax that makes floors slippery. If you must use wax, use non-skid floor wax.  Do not have throw rugs and other things on the floor that can make you trip. What can I do with my stairs?  Do not leave any items on the stairs.  Make sure that there are handrails on both sides of the stairs and use them. Fix handrails that are broken or loose. Make sure that handrails are as long as the stairways.  Check any carpeting to make sure that it is firmly attached to the stairs. Fix any carpet that is loose or worn.  Avoid having throw  rugs at the top or bottom of the stairs. If you do have throw rugs, attach them to the floor with carpet tape.  Make sure that you have a light switch at the top of the stairs and the bottom of the stairs. If you do not have them, ask someone to add them for you. What else can I do to help prevent falls?  Wear shoes that:  Do not have high heels.  Have rubber bottoms.  Are comfortable and fit you well.  Are closed at the toe. Do not wear sandals.  If you use a stepladder:  Make sure that it is fully opened. Do not climb a closed stepladder.  Make sure that both sides of the stepladder are locked into place.  Ask someone to hold it for you, if possible.  Clearly mark and make sure that you can see:  Any grab bars or handrails.  First and last steps.  Where the edge of each step is.  Use tools that help you move around (mobility aids) if they are needed. These include:  Canes.  Walkers.  Scooters.  Crutches.  Turn on the lights when you go into a dark area. Replace any light bulbs as soon as they burn out.  Set up your furniture so you have a clear path. Avoid moving your furniture around.  If any of your floors are uneven, fix them.  If there are any pets around you, be aware of where they are.  Review your medicines with your doctor. Some medicines can make you feel dizzy. This can increase your chance  of falling. Ask your doctor what other things that you can do to help prevent falls. This information is not intended to replace advice given to you by your health care provider. Make sure you discuss any questions you have with your health care provider. Document Released: 12/13/2008 Document Revised: 07/25/2015 Document Reviewed: 03/23/2014  2017 Elsevier  Health Maintenance, Female Introduction Adopting a healthy lifestyle and getting preventive care can go a long way to promote health and wellness. Talk with your health care provider about what schedule of regular examinations is right for you. This is a good chance for you to check in with your provider about disease prevention and staying healthy. In between checkups, there are plenty of things you can do on your own. Experts have done a lot of research about which lifestyle changes and preventive measures are most likely to keep you healthy. Ask your health care provider for more information. Weight and diet Eat a healthy diet  Be sure to include plenty of vegetables, fruits, low-fat dairy products, and lean protein.  Do not eat a lot of foods high in solid fats, added sugars, or salt.  Get regular exercise. This is one of the most important things you can do for your health.  Most adults should exercise for at least 150 minutes each week. The exercise should increase your heart rate and make you sweat (moderate-intensity exercise).  Most adults should also do strengthening exercises at least twice a week. This is in addition to the moderate-intensity exercise. Maintain a healthy weight  Body mass index (BMI) is a measurement that can be used to identify possible weight problems. It estimates body fat based on height and weight. Your health care provider can help determine your BMI and help you achieve or maintain a healthy weight.  For females 24 years of age and older:  A BMI below 18.5 is considered underweight.  A BMI of  18.5 to 24.9 is normal.  A BMI of 25 to 29.9 is considered overweight.  A BMI of 30 and above is considered obese. Watch levels of cholesterol and blood lipids  You should start having your blood tested for lipids and cholesterol at 69 years of age, then have this test every 5 years.  You may need to have your cholesterol levels checked more often if:  Your lipid or cholesterol levels are high.  You are older than 68 years of age.  You are at high risk for heart disease. Cancer screening Lung Cancer  Lung cancer screening is recommended for adults 53-10 years old who are at high risk for lung cancer because of a history of smoking.  A yearly low-dose CT scan of the lungs is recommended for people who:  Currently smoke.  Have quit within the past 15 years.  Have at least a 30-pack-year history of smoking. A pack year is smoking an average of one pack of cigarettes a day for 1 year.  Yearly screening should continue until it has been 15 years since you quit.  Yearly screening should stop if you develop a health problem that would prevent you from having lung cancer treatment. Breast Cancer  Practice breast self-awareness. This means understanding how your breasts normally appear and feel.  It also means doing regular breast self-exams. Let your health care provider know about any changes, no matter how small.  If you are in your 20s or 30s, you should have a clinical breast exam (CBE) by a health care provider every 1-3 years as part of a regular health exam.  If you are 32 or older, have a CBE every year. Also consider having a breast X-ray (mammogram) every year.  If you have a family history of breast cancer, talk to your health care provider about genetic screening.  If you are at high risk for breast cancer, talk to your health care provider about having an MRI and a mammogram every year.  Breast cancer gene (BRCA) assessment is recommended for women who have family  members with BRCA-related cancers. BRCA-related cancers include:  Breast.  Ovarian.  Tubal.  Peritoneal cancers.  Results of the assessment will determine the need for genetic counseling and BRCA1 and BRCA2 testing. Cervical Cancer  Your health care provider may recommend that you be screened regularly for cancer of the pelvic organs (ovaries, uterus, and vagina). This screening involves a pelvic examination, including checking for microscopic changes to the surface of your cervix (Pap test). You may be encouraged to have this screening done every 3 years, beginning at age 30.  For women ages 9-65, health care providers may recommend pelvic exams and Pap testing every 3 years, or they may recommend the Pap and pelvic exam, combined with testing for human papilloma virus (HPV), every 5 years. Some types of HPV increase your risk of cervical cancer. Testing for HPV may also be done on women of any age with unclear Pap test results.  Other health care providers may not recommend any screening for nonpregnant women who are considered low risk for pelvic cancer and who do not have symptoms. Ask your health care provider if a screening pelvic exam is right for you.  If you have had past treatment for cervical cancer or a condition that could lead to cancer, you need Pap tests and screening for cancer for at least 20 years after your treatment. If Pap tests have been discontinued, your  risk factors (such as having a new sexual partner) need to be reassessed to determine if screening should resume. Some women have medical problems that increase the chance of getting cervical cancer. In these cases, your health care provider may recommend more frequent screening and Pap tests. Colorectal Cancer  This type of cancer can be detected and often prevented.  Routine colorectal cancer screening usually begins at 69 years of age and continues through 69 years of age.  Your health care provider may recommend  screening at an earlier age if you have risk factors for colon cancer.  Your health care provider may also recommend using home test kits to check for hidden blood in the stool.  A small camera at the end of a tube can be used to examine your colon directly (sigmoidoscopy or colonoscopy). This is done to check for the earliest forms of colorectal cancer.  Routine screening usually begins at age 36.  Direct examination of the colon should be repeated every 5-10 years through 69 years of age. However, you may need to be screened more often if early forms of precancerous polyps or small growths are found. Skin Cancer  Check your skin from head to toe regularly.  Tell your health care provider about any new moles or changes in moles, especially if there is a change in a mole's shape or color.  Also tell your health care provider if you have a mole that is larger than the size of a pencil eraser.  Always use sunscreen. Apply sunscreen liberally and repeatedly throughout the day.  Protect yourself by wearing long sleeves, pants, a wide-brimmed hat, and sunglasses whenever you are outside. Heart disease, diabetes, and high blood pressure  High blood pressure causes heart disease and increases the risk of stroke. High blood pressure is more likely to develop in:  People who have blood pressure in the high end of the normal range (130-139/85-89 mm Hg).  People who are overweight or obese.  People who are African American.  If you are 70-28 years of age, have your blood pressure checked every 3-5 years. If you are 44 years of age or older, have your blood pressure checked every year. You should have your blood pressure measured twice-once when you are at a hospital or clinic, and once when you are not at a hospital or clinic. Record the average of the two measurements. To check your blood pressure when you are not at a hospital or clinic, you can use:  An automated blood pressure machine at a  pharmacy.  A home blood pressure monitor.  If you are between 29 years and 20 years old, ask your health care provider if you should take aspirin to prevent strokes.  Have regular diabetes screenings. This involves taking a blood sample to check your fasting blood sugar level.  If you are at a normal weight and have a low risk for diabetes, have this test once every three years after 69 years of age.  If you are overweight and have a high risk for diabetes, consider being tested at a younger age or more often. Preventing infection Hepatitis B  If you have a higher risk for hepatitis B, you should be screened for this virus. You are considered at high risk for hepatitis B if:  You were born in a country where hepatitis B is common. Ask your health care provider which countries are considered high risk.  Your parents were born in a high-risk country, and you  have not been immunized against hepatitis B (hepatitis B vaccine).  You have HIV or AIDS.  You use needles to inject street drugs.  You live with someone who has hepatitis B.  You have had sex with someone who has hepatitis B.  You get hemodialysis treatment.  You take certain medicines for conditions, including cancer, organ transplantation, and autoimmune conditions. Hepatitis C  Blood testing is recommended for:  Everyone born from [birth year] through 1963.  Anyone with known risk factors for hepatitis C. Sexually transmitted infections (STIs)  You should be screened for sexually transmitted infections (STIs) including gonorrhea and chlamydia if:  You are sexually active and are younger than 69 years of age.  You are older than 69 years of age and your health care provider tells you that you are at risk for this type of infection.  Your sexual activity has changed since you were last screened and you are at an increased risk for chlamydia or gonorrhea. Ask your health care provider if you are at risk.  If you do not have  HIV, but are at risk, it may be recommended that you take a prescription medicine daily to prevent HIV infection. This is called pre-exposure prophylaxis (PrEP). You are considered at risk if:  You are sexually active and do not regularly use condoms or know the HIV status of your partner(s).  You take drugs by injection.  You are sexually active with a partner who has HIV. Talk with your health care provider about whether you are at high risk of being infected with HIV. If you choose to begin PrEP, you should first be tested for HIV. You should then be tested every 3 months for as long as you are taking PrEP. Pregnancy  If you are premenopausal and you may become pregnant, ask your health care provider about preconception counseling.  If you may become pregnant, take 400 to 800 micrograms (mcg) of folic acid every day.  If you want to prevent pregnancy, talk to your health care provider about birth control (contraception). Osteoporosis and menopause  Osteoporosis is a disease in which the bones lose minerals and strength with aging. This can result in serious bone fractures. Your risk for osteoporosis can be identified using a bone density scan.  If you are 74 years of age or older, or if you are at risk for osteoporosis and fractures, ask your health care provider if you should be screened.  Ask your health care provider whether you should take a calcium or vitamin D supplement to lower your risk for osteoporosis.  Menopause may have certain physical symptoms and risks.  Hormone replacement therapy may reduce some of these symptoms and risks. Talk to your health care provider about whether hormone replacement therapy is right for you. Follow these instructions at home:  Schedule regular health, dental, and eye exams.  Stay current with your immunizations.  Do not use any tobacco products including cigarettes, chewing tobacco, or electronic cigarettes.  If you are pregnant, do not  drink alcohol.  If you are breastfeeding, limit how much and how often you drink alcohol.  Limit alcohol intake to no more than 1 drink per day for nonpregnant women. One drink equals 12 ounces of beer, 5 ounces of wine, or 1 ounces of hard liquor.  Do not use street drugs.  Do not share needles.  Ask your health care provider for help if you need support or information about quitting drugs.  Tell your health care provider  if you often feel depressed.  Tell your health care provider if you have ever been abused or do not feel safe at home. This information is not intended to replace advice given to you by your health care provider. Make sure you discuss any questions you have with your health care provider. Document Released: 09/01/2010 Document Revised: 07/25/2015 Document Reviewed: 11/20/2014  2017 Elsevier   Mammogram A mammogram is an X-ray of the breasts that is done to check for abnormal changes. This procedure can screen for and detect any changes that may suggest breast cancer. A mammogram can also identify other changes and variations in the breast, such as:  Inflammation of the breast tissue (mastitis).  An infected area that contains a collection of pus (abscess).  A fluid-filled sac (cyst).  Fibrocystic changes. This is when breast tissue becomes denser, which can make the tissue feel rope-like or uneven under the skin.  Tumors that are not cancerous (benign). Tell a health care provider about:  Any allergies you have.  If you have breast implants.  If you have had previous breast disease, biopsy, or surgery.  If you are breastfeeding.  Any possibility that you could be pregnant, if this applies.  If you are younger than age 78.  If you have a family history of breast cancer. What are the risks? Generally, this is a safe procedure. However, problems may occur, including:  Exposure to radiation. Radiation levels are very low with this test.  The  results being misinterpreted.  The need for further tests.  The inability of the mammogram to detect certain cancers. What happens before the procedure?  Schedule your test about 1-2 weeks after your menstrual period. This is usually when your breasts are the least tender.  If you have had a mammogram done at a different facility in the past, get the mammogram X-rays or have them sent to your current exam facility in order to compare them.  Wash your breasts and under your arms the day of the test.  Do not wear deodorants, perfumes, lotions, or powders anywhere on your body on the day of the test.  Remove any jewelry from your neck.  Wear clothes that you can change into and out of easily. What happens during the procedure?  You will undress from the waist up and put on a gown.  You will stand in front of the X-ray machine.  Each breast will be placed between two plastic or glass plates. The plates will compress your breast for a few seconds. Try to stay as relaxed as possible during the procedure. This does not cause any harm to your breasts and any discomfort you feel will be very brief.  X-rays will be taken from different angles of each breast. The procedure may vary among health care providers and hospitals. What happens after the procedure?  The mammogram will be examined by a specialist (radiologist).  You may need to repeat certain parts of the test, depending on the quality of the images. This is commonly done if the radiologist needs a better view of the breast tissue.  Ask when your test results will be ready. Make sure you get your test results.  You may resume your normal activities. This information is not intended to replace advice given to you by your health care provider. Make sure you discuss any questions you have with your health care provider. Document Released: 02/14/2000 Document Revised: 07/22/2015 Document Reviewed: 04/27/2014 Elsevier Interactive  Patient Education  2017  [employer].
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
## 2016-04-21 ENCOUNTER — Encounter: Payer: Self-pay | Admitting: Family Medicine

## 2016-04-21 ENCOUNTER — Ambulatory Visit (INDEPENDENT_AMBULATORY_CARE_PROVIDER_SITE_OTHER): Payer: Medicare Other | Admitting: Family Medicine

## 2016-04-21 VITALS — BP 122/70 | HR 68 | Temp 98.3°F | Ht 60.0 in | Wt 101.2 lb

## 2016-04-21 DIAGNOSIS — F17200 Nicotine dependence, unspecified, uncomplicated: Secondary | ICD-10-CM

## 2016-04-21 DIAGNOSIS — M810 Age-related osteoporosis without current pathological fracture: Secondary | ICD-10-CM

## 2016-04-21 MED ORDER — ALENDRONATE SODIUM 70 MG PO TABS: 70.0000 mg | ORAL_TABLET | ORAL | 11 refills | Status: DC

## 2016-04-21 NOTE — Progress Notes (Signed)
Pre visit review using our clinic review tool, if applicable. No additional management support is needed unless otherwise documented below in the visit note. 

## 2016-04-21 NOTE — Patient Instructions (Signed)
You have osteoporosis At a minimum, recommend [phone] units of vitamin D (ok up to 2000 a day) and 1200mg  of Calcium per day. You can get this with a calcium-vitamin D supplement.  Once you have the above in place, I would start taking fosamax 70mg  once a week.  Administer first thing in the morning and >30 minutes before the first food, beverage (except plain water), or other medication of the day. Do not take with mineral water or with other beverages. Stay upright (not to lie down) for at least 30 minutes after taking medicine and until after first food of the day (to reduce irritation). Must be taken with 6 to 8 oz of plain water. The tablet should be swallowed whole; do not chew or suck.

## 2016-04-21 NOTE — Progress Notes (Signed)
Subjective:  Sheena [name] is a 71 y.o. year old very pleasant female patient who presents for/with See problem oriented charting ROS- continued low back pain- prior compression fractures contributed to by osteopenia, no chest pain or shortness of breath, admits to some anxieyty and stress in dealing with husband   Past Medical History-  Patient Active Problem List   Diagnosis Date Noted  . Chronic diastolic CHF (congestive heart failure) (HCC) 03/06/2015    Priority: High  . Smoker 02/19/2014    Priority: High  . Disequilibrium 02/19/2014    Priority: High  . Hyperlipidemia 03/05/2014    Priority: Medium  . Chronic low back pain 12/13/2012    Priority: Medium  . Essential hypertension, benign 09/03/2012    Priority: Medium  . Ataxia 04/01/2011    Priority: Medium  . Osteoporosis 03/21/2010    Priority: Medium  . MURMUR 03/21/2010    Priority: Medium  . Hyperglycemia 03/21/2010    Priority: Medium  . Vertebral compression fracture (HCC) 01/04/2013    Priority: Low  . Sinusitis, chronic 09/03/2012    Priority: Low  . Anxiety 04/01/2011    Priority: Low  . Blurred vision 03/21/2010    Priority: Low    Medications- reviewed and updated Current Outpatient Prescriptions  Medication Sig Dispense Refill  . acetaminophen (TYLENOL) 500 MG tablet Take 500 mg by mouth 2 (two) times daily as needed for mild pain. Reported on 06/04/2015    . aspirin 81 MG tablet Take 81 mg by mouth daily. Twice a week    . atorvastatin (LIPITOR) 20 MG tablet Take 1 tablet (20 mg total) by mouth daily. 90 tablet 3  . cyclobenzaprine (FLEXERIL) 5 MG tablet TAKE 1 TABLET BY MOUTH TWICE A DAY AS NEEDED FOR MUSCLE SPASMS 30 tablet 0  . furosemide (LASIX) 40 MG tablet Take 1 tablet (40 mg total) by mouth daily. 90 tablet 3  . metoprolol (LOPRESSOR) 50 MG tablet TAKE ONE TABLET BY MOUTH TWICE DAILY 180 tablet 3  . traMADol (ULTRAM) 50 MG tablet TAKE 1 TABLET BY MOUTH EVERY 6 HOURS AS NEEDED 30 tablet 2  .  alendronate (FOSAMAX) 70 MG tablet Take 1 tablet (70 mg total) by mouth every 7 (seven) days. Take with a full glass of water on an empty stomach. 4 tablet 11   No current facility-administered medications for this visit.     Objective: BP 122/70 (BP Location: Left Arm, Patient Position: Sitting, Cuff Size: Normal)   Pulse 68   Temp 98.3 F (36.8 C) (Oral)   Ht 5' (1.524 m)   Wt 101 lb 3.2 oz (45.9 kg)   SpO2 97%   BMI 19.76 kg/m  Gen: NAD, resting comfortably Anxious at times  Assessment/Plan:  Smoker Had quit from September to December but restarted when husband was struggling with side effects from quitting/withdrawal. She is still down to 5 a day- encouraged complete cessation and she wants to requit. - will trial  Osteoporosis S: she apparently was diagnosed with osteoporosis in 17s. Had tried bisophonate for about 5 months prior- and states she had hot flashes and had to stop. She did another Dexa with AP lumbar spine worse than -4.0 A/P: she agrees to retrial fosamax. Discussed a different time frame in life and may not have same reaction. She would not want to trial any injectable as "I could not get it out of my system" so options are limited. Extended counseling particularly on calcium and vitamin D amounts and reviewing some  vitamin labels online together. Also please see avs   Meds ordered this encounter  Medications  . alendronate (FOSAMAX) 70 MG tablet    Sig: Take 1 tablet (70 mg total) by mouth every 7 (seven) days. Take with a full glass of water on an empty stomach.    Dispense:  4 tablet    Refill:  11    Return precautions advised.  Tana ConchStephen Stanley Helmuth, MD   The duration of face-to-face time during this visit was greater than 15 minutes. Greater than 50% of this time was spent in counseling, explanation of diagnosis, planning of further management, and/or coordination of care including osteoporosis treatment options and needed calcium/vit D.

## 2016-04-21 NOTE — Assessment & Plan Note (Signed)
S: she apparently was diagnosed with osteoporosis in 340s. Had tried bisophonate for about 5 months prior- and states she had hot flashes and had to stop. She did another Dexa with AP lumbar spine worse than -4.0 A/P: she agrees to retrial fosamax. Discussed a different time frame in life and may not have same reaction. She would not want to trial any injectable as "I could not get it out of my system" so options are limited. Extended counseling particularly on calcium and vitamin D amounts and reviewing some vitamin labels online together. Also please see avs

## 2016-04-21 NOTE — Assessment & Plan Note (Signed)
Had quit from September to December but restarted when husband was struggling with side effects from quitting/withdrawal. She is still down to 5 a day- encouraged complete cessation and she wants to requit. - will trial

## 2016-05-05 ENCOUNTER — Other Ambulatory Visit: Payer: Self-pay | Admitting: Family Medicine

## 2016-05-07 NOTE — Telephone Encounter (Signed)
Pt calling to check the status of Rx °

## 2016-05-29 ENCOUNTER — Other Ambulatory Visit: Payer: Self-pay | Admitting: Family Medicine

## 2016-06-01 ENCOUNTER — Other Ambulatory Visit: Payer: Self-pay | Admitting: Family Medicine

## 2016-06-22 ENCOUNTER — Other Ambulatory Visit: Payer: Self-pay | Admitting: Family Medicine

## 2016-06-22 NOTE — Telephone Encounter (Signed)
Call patient. Has she really gone through both refills already?

## 2016-06-22 NOTE — Telephone Encounter (Signed)
May change to #60- 2 refills. Advise follow up before she runs out

## 2017-03-22 ENCOUNTER — Other Ambulatory Visit: Payer: Self-pay | Admitting: Family Medicine

## 2017-03-22 MED ORDER — ALENDRONATE SODIUM 70 MG PO TABS: 70.0000 mg | ORAL_TABLET | ORAL | 0 refills | Status: DC

## 2017-03-22 NOTE — Telephone Encounter (Signed)
Copied from CRM [id number]. Topic: Quick Communication - Rx Refill/Question >> Mar 22, 2017  4:36 PM Jolayne Hainesaylor, Brittany L wrote: Medication:  alendronate (FOSAMAX) 70 MG tablet Has the patient contacted their pharmacy? yes   (Agent: If no, request that the patient contact the pharmacy for the refill.)   Preferred Pharmacy (with phone number or street name): [id number] [place], [place] - [address]   Agent: Please be advised that RX refills may take up to 3 business days. We ask that you follow-up with your pharmacy.

## 2017-04-20 ENCOUNTER — Other Ambulatory Visit: Payer: Self-pay | Admitting: Family Medicine

## 2017-04-26 ENCOUNTER — Other Ambulatory Visit: Payer: Self-pay | Admitting: Family Medicine

## 2017-04-26 MED ORDER — ALENDRONATE SODIUM 70 MG PO TABS: ORAL_TABLET | ORAL | 2 refills | Status: DC

## 2017-04-26 NOTE — Telephone Encounter (Signed)
Fosamax refill  LOV 04/21/16  Dr. Durene CalHunter  [id number]  [address]  [place], [place]

## 2017-04-26 NOTE — Telephone Encounter (Signed)
See note

## 2017-04-26 NOTE — Telephone Encounter (Signed)
Copied from CRM [id number]. Topic: Quick Communication - Rx Refill/Question >> Apr 26, 2017 11:26 AM Louie BunPalacios Medina, Rosey Batheresa D wrote: Medication: alendronate (FOSAMAX) 70 MG tablet   Has the patient contacted their pharmacy? Yes   (Agent: If no, request that the patient contact the pharmacy for the refill.)   Preferred Pharmacy (with phone number or street name): [id number]- [place], [place] - [address]   Agent: Please be advised that RX refills may take up to 3 business days. We ask that you follow-up with your pharmacy.

## 2017-05-27 ENCOUNTER — Other Ambulatory Visit: Payer: Self-pay | Admitting: *Deleted

## 2017-05-27 ENCOUNTER — Telehealth: Payer: Self-pay | Admitting: Family Medicine

## 2017-05-27 MED ORDER — ATORVASTATIN CALCIUM 20 MG PO TABS: 20.0000 mg | ORAL_TABLET | Freq: Every day | ORAL | 0 refills | Status: DC

## 2017-05-27 MED ORDER — FUROSEMIDE 40 MG PO TABS: 40.0000 mg | ORAL_TABLET | Freq: Every day | ORAL | 0 refills | Status: DC

## 2017-05-27 NOTE — Telephone Encounter (Signed)
Copied from CRM [id number]. Topic: Quick Communication - Rx Refill/Question >> May 27, 2017 12:18 PM Gloriann LoanPayne, Slayde Brault L wrote: Medication: atorvastatin (LIPITOR) 20 MG tablet furosemide (LASIX) 40 MG tablet    Has the patient contacted their pharmacy? Yes.  Pharmacy has faxed this twice to practice (Agent: If no, request that the patient contact the pharmacy for the refill.) Preferred Pharmacy (with phone number or street name):   [id number] [place], [place] [address] [facility] [phone] (Phone) [phone] (Fax)     Agent: Please be advised that RX refills may take up to 3 business days. We ask that you follow-up with your pharmacy.

## 2017-06-01 ENCOUNTER — Other Ambulatory Visit: Payer: Self-pay

## 2017-06-01 MED ORDER — ATORVASTATIN CALCIUM 20 MG PO TABS: 20.0000 mg | ORAL_TABLET | Freq: Every day | ORAL | 3 refills | Status: DC

## 2017-06-20 ENCOUNTER — Other Ambulatory Visit: Payer: Self-pay | Admitting: Family Medicine

## 2017-06-21 ENCOUNTER — Telehealth: Payer: Self-pay | Admitting: Family Medicine

## 2017-06-21 NOTE — Telephone Encounter (Signed)
Pt states pharmacy has been sending refill to the wrong office. Pt has appt on 06/30/2017 but will be out in 2 days.  Pt would like a 90 day sent to  [id number] , [place] [address] [facility] [phone] (Phone) [phone] (Fax)

## 2017-06-21 NOTE — Telephone Encounter (Signed)
Rx refilled by office today 

## 2017-06-30 ENCOUNTER — Encounter: Payer: Self-pay | Admitting: Family Medicine

## 2017-06-30 ENCOUNTER — Ambulatory Visit (INDEPENDENT_AMBULATORY_CARE_PROVIDER_SITE_OTHER): Payer: Medicare Other | Admitting: Family Medicine

## 2017-06-30 VITALS — BP 116/78 | HR 65 | Temp 97.3°F | Ht 60.0 in | Wt 98.6 lb

## 2017-06-30 DIAGNOSIS — I5032 Chronic diastolic (congestive) heart failure: Secondary | ICD-10-CM

## 2017-06-30 DIAGNOSIS — R319 Hematuria, unspecified: Secondary | ICD-10-CM

## 2017-06-30 DIAGNOSIS — I10 Essential (primary) hypertension: Secondary | ICD-10-CM

## 2017-06-30 DIAGNOSIS — M545 Low back pain, unspecified: Secondary | ICD-10-CM

## 2017-06-30 DIAGNOSIS — G8929 Other chronic pain: Secondary | ICD-10-CM | POA: Diagnosis not present

## 2017-06-30 DIAGNOSIS — R739 Hyperglycemia, unspecified: Secondary | ICD-10-CM

## 2017-06-30 DIAGNOSIS — M810 Age-related osteoporosis without current pathological fracture: Secondary | ICD-10-CM | POA: Diagnosis not present

## 2017-06-30 DIAGNOSIS — F17209 Nicotine dependence, unspecified, with unspecified nicotine-induced disorders: Secondary | ICD-10-CM | POA: Diagnosis not present

## 2017-06-30 DIAGNOSIS — E785 Hyperlipidemia, unspecified: Secondary | ICD-10-CM

## 2017-06-30 LAB — CBC
HEMATOCRIT: 38.3 % (ref 36.0–46.0)
Hemoglobin: 12.7 g/dL (ref 12.0–15.0)
MCHC: 33.1 g/dL (ref 30.0–36.0)
MCV: 98.4 fl (ref 78.0–100.0)
PLATELETS: 413 10*3/uL — AB (ref 150.0–400.0)
RBC: 3.89 Mil/uL (ref 3.87–5.11)
RDW: 12.8 % (ref 11.5–15.5)
WBC: 11.4 10*3/uL — AB (ref 4.0–10.5)

## 2017-06-30 LAB — COMPREHENSIVE METABOLIC PANEL
ALK PHOS: 87 U/L (ref 39–117)
ALT: 9 U/L (ref 0–35)
AST: 16 U/L (ref 0–37)
Albumin: 4.7 g/dL (ref 3.5–5.2)
BUN: 13 mg/dL (ref 6–23)
CALCIUM: 10 mg/dL (ref 8.4–10.5)
CHLORIDE: 98 meq/L (ref 96–112)
CO2: 28 mEq/L (ref 19–32)
CREATININE: 0.93 mg/dL (ref 0.40–1.20)
GFR: 63.35 mL/min (ref >60.00)
Glucose, Bld: 108 mg/dL — ABNORMAL HIGH (ref 70–99)
POTASSIUM: 4.9 meq/L (ref 3.5–5.1)
Sodium: 136 mEq/L (ref 135–145)
TOTAL PROTEIN: 7.8 g/dL (ref 6.0–8.3)
Total Bilirubin: 0.5 mg/dL (ref 0.2–1.2)

## 2017-06-30 LAB — LIPID PANEL
CHOL/HDL RATIO: 3
Cholesterol: 152 mg/dL (ref 0–200)
HDL: 57.7 mg/dL (ref >39.00)
LDL Cholesterol: 66 mg/dL (ref 0–99)
NONHDL: 94.39
Triglycerides: 144 mg/dL (ref 0.0–149.0)
VLDL: 28.8 mg/dL (ref 0.0–40.0)

## 2017-06-30 LAB — POC URINALSYSI DIPSTICK (AUTOMATED)
Bilirubin, UA: NEGATIVE
Glucose, UA: NEGATIVE
KETONES UA: NEGATIVE
Leukocytes, UA: NEGATIVE
Nitrite, UA: NEGATIVE
PH UA: 6 (ref 5.0–8.0)
Protein, UA: NEGATIVE
SPEC GRAV UA: 1.015 (ref 1.010–1.025)
UROBILINOGEN UA: 0.2 U/dL

## 2017-06-30 LAB — HEMOGLOBIN A1C: HEMOGLOBIN A1C: 5.6 % (ref 4.6–6.5)

## 2017-06-30 MED ORDER — METOPROLOL TARTRATE 50 MG PO TABS: 50.0000 mg | ORAL_TABLET | Freq: Two times a day (BID) | ORAL | 0 refills | Status: DC

## 2017-06-30 MED ORDER — FUROSEMIDE 40 MG PO TABS: 40.0000 mg | ORAL_TABLET | Freq: Every day | ORAL | 5 refills | Status: DC

## 2017-06-30 MED ORDER — ALENDRONATE SODIUM 70 MG PO TABS: ORAL_TABLET | ORAL | 2 refills | Status: DC

## 2017-06-30 MED ORDER — ATORVASTATIN CALCIUM 20 MG PO TABS: 20.0000 mg | ORAL_TABLET | Freq: Every day | ORAL | 3 refills | Status: DC

## 2017-06-30 MED ORDER — ALENDRONATE SODIUM 70 MG PO TABS: ORAL_TABLET | ORAL | 3 refills | Status: DC

## 2017-06-30 MED ORDER — CYCLOBENZAPRINE HCL 5 MG PO TABS: ORAL_TABLET | ORAL | 1 refills | Status: DC

## 2017-06-30 NOTE — Progress Notes (Signed)
Follow up scheduled. Patient declined an AWV for the time being.

## 2017-06-30 NOTE — Assessment & Plan Note (Signed)
S: reasonably controlled on atorvastatin  Lab Results  Component Value Date   CHOL 160 06/04/2015   HDL 52.20 06/04/2015   LDLCALC 77 06/04/2015   LDLDIRECT 189.3 03/05/2014   TRIG 154.0 (H) 06/04/2015   CHOLHDL 3 06/04/2015   A/P: update lipids

## 2017-06-30 NOTE — Addendum Note (Signed)
Addended by: Felix Ahmadi A on: 06/30/2017 04:05 PM   Modules accepted: Orders

## 2017-06-30 NOTE — Assessment & Plan Note (Signed)
S: Patient continues to have lumbar and thoracic back pain.  She uses tramadol and Flexeril sparingly.  She does have a history of B12 vertebroplasty several years ago.  No recent worsening pain.  Heating pad helps some. Has had some left hip pain recently after doing some work around the house and has some left low back pain- taking it easy there. No falls A/P: she has about 30 tramadol left from a fill last year- discussed would be reasonable to use. Can also use flexeril as needed- need to be careful about sleepiness and fall risk but no recent falls.

## 2017-06-30 NOTE — Patient Instructions (Addendum)
Health Maintenance Due  Topic Date Due  . MAMMOGRAM - Patient hasn't had one due to her back pain. Our team will give you a handout for breast center would love if you could go ahead and call and schedule a follow up 07/12/1997   Also advised needs 1200 mg calcium per day (between pills and diet) and vitamin d at least 800 units- she has not done this in recent weeks- has to restart.   Our team will give you a handout on calcium in diet.   Please stop by lab before you go

## 2017-06-30 NOTE — Assessment & Plan Note (Signed)
S: at risk for diabetes in the past. Doesn't need weight loss. Advised try to exercise- even if arm exercises Lab Results  Component Value Date   HGBA1C 5.8 06/04/2015   HGBA1C 5.6 04/01/2011   HGBA1C 6.0 03/21/2010  A/P: update a1c

## 2017-06-30 NOTE — Assessment & Plan Note (Signed)
1/2 PPD- advised cessation- she is not ready

## 2017-06-30 NOTE — Assessment & Plan Note (Signed)
Fosamax since February 2018. Continue current medicine and update bone density 2020 or 2021. Also advised needs 1200 mg calcium per day and vitamin d at least 800 units- she has not done this in recent weeks- has to restart.

## 2017-06-30 NOTE — Progress Notes (Signed)
Subjective:  Sheena [name] is a 72 y.o. year old very pleasant female patient who presents for/with See problem oriented charting ROS- No chest pain or shortness of breath. No headache or blurry vision. No increased edema or weight.  Some continued balance issues-has been chronic issue  Past Medical History-  Patient Active Problem List   Diagnosis Date Noted  . Chronic diastolic CHF (congestive heart failure) (HCC) 03/06/2015    Priority: High  . Tobacco use disorder, continuous 02/19/2014    Priority: High  . Disequilibrium 02/19/2014    Priority: High  . Hyperlipidemia 03/05/2014    Priority: Medium  . Chronic low back pain 12/13/2012    Priority: Medium  . Essential hypertension, benign 09/03/2012    Priority: Medium  . Ataxia 04/01/2011    Priority: Medium  . Osteoporosis 03/21/2010    Priority: Medium  . MURMUR 03/21/2010    Priority: Medium  . Hyperglycemia 03/21/2010    Priority: Medium  . Vertebral compression fracture (HCC) 01/04/2013    Priority: Low  . Sinusitis, chronic 09/03/2012    Priority: Low  . Anxiety 04/01/2011    Priority: Low  . Blurred vision 03/21/2010    Priority: Low    Medications- reviewed and updated Current Outpatient Medications  Medication Sig Dispense Refill  . acetaminophen (TYLENOL) 500 MG tablet Take 500 mg by mouth 2 (two) times daily as needed for mild pain. Reported on 06/04/2015    . alendronate (FOSAMAX) 70 MG tablet TAKE 1 TABLET BY MOUTH EVERY 7 DAYS. TAKE WITH A FULL GLASS OF WATER ON AN EMPTY STOMACH. 13 tablet 3  . aspirin 81 MG tablet Take 81 mg by mouth daily. Twice a week    . atorvastatin (LIPITOR) 20 MG tablet Take 1 tablet (20 mg total) by mouth daily. 90 tablet 3  . cyclobenzaprine (FLEXERIL) 5 MG tablet TAKE 1 TABLET BY MOUTH TWICE A DAY AS NEEDED FOR MUSCLE SPASMS 30 tablet 1  . furosemide (LASIX) 40 MG tablet Take 1 tablet (40 mg total) by mouth daily. 30 tablet 5  . metoprolol tartrate (LOPRESSOR) 50 MG tablet Take  1 tablet (50 mg total) by mouth 2 (two) times daily. 180 tablet 0  . traMADol (ULTRAM) 50 MG tablet TAKE 1 TABLET BY MOUTH EVERY 6 HOURS AS NEEDED FOR PAIN 60 tablet 2   No current facility-administered medications for this visit.     Objective: BP 116/78 (BP Location: Left Arm, Patient Position: Sitting, Cuff Size: Normal)   Pulse 65   Temp (!) 97.3 F (36.3 C) (Oral)   Ht 5' (1.524 m)   Wt 98 lb 9.6 oz (44.7 kg)   SpO2 98%   BMI 19.26 kg/m  Gen: NAD, resting comfortably CV: RRR stable murmur.  No rubs or gallops Lungs: CTAB no crackles, wheeze, rhonchi Abdomen: soft/nontender/nondistended/normal bowel sounds.  Thin Ext: no edema Skin: warm, dry Pain with palpation of paraspinous muscles in lumbar spine.  Assessment/Plan:  Chronic low back pain S: Patient continues to have lumbar and thoracic back pain.  She uses tramadol and Flexeril sparingly.  She does have a history of B12 vertebroplasty several years ago.  No recent worsening pain.  Heating pad helps some. Has had some left hip pain recently after doing some work around the house and has some left low back pain- taking it easy there. No falls A/P: she has about 30 tramadol left from a fill last year- discussed would be reasonable to use. Can also use flexeril as  needed- need to be careful about sleepiness and fall risk but no recent falls.   Essential hypertension, benign S: controlled on metoprolol 50 mg twice a day and Lasix 40 mg daily. BP Readings from Last 3 Encounters:  06/30/17 116/78  04/21/16 122/70  04/07/16 128/70  A/P: We discussed blood pressure goal of <140/90. Continue current meds  Hyperlipidemia S: reasonably controlled on atorvastatin  Lab Results  Component Value Date   CHOL 160 06/04/2015   HDL 52.20 06/04/2015   LDLCALC 77 06/04/2015   LDLDIRECT 189.3 03/05/2014   TRIG 154.0 (H) 06/04/2015   CHOLHDL 3 06/04/2015   A/P: update lipids  Hyperglycemia S: at risk for diabetes in the past.  Doesn't need weight loss. Advised try to exercise- even if arm exercises Lab Results  Component Value Date   HGBA1C 5.8 06/04/2015   HGBA1C 5.6 04/01/2011   HGBA1C 6.0 03/21/2010  A/P: update a1c  Osteoporosis Fosamax since February 2018. Continue current medicine and update bone density 2020 or 2021. Also advised needs 1200 mg calcium per day and vitamin d at least 800 units- she has not done this in recent weeks- has to restart.   Tobacco use disorder, continuous 1/2 PPD- advised cessation- she is not ready  Chronic diastolic CHF (congestive heart failure) (HCC) Stable on lasix . No increased swelling or shortness of breath.   Will call for six-month follow-up  Lab/Order associations: Hyperlipidemia, unspecified hyperlipidemia type - Plan: Lipid panel, CBC, Comprehensive metabolic panel, POCT Urinalysis Dipstick (Automated)  Hyperglycemia - Plan: Hemoglobin A1c  Meds ordered this encounter  Medications  . DISCONTD: alendronate (FOSAMAX) 70 MG tablet    Sig: TAKE 1 TABLET BY MOUTH EVERY 7 DAYS. TAKE WITH A FULL GLASS OF WATER ON AN EMPTY STOMACH.    Dispense:  4 tablet    Refill:  2  . atorvastatin (LIPITOR) 20 MG tablet    Sig: Take 1 tablet (20 mg total) by mouth daily.    Dispense:  90 tablet    Refill:  3  . furosemide (LASIX) 40 MG tablet    Sig: Take 1 tablet (40 mg total) by mouth daily.    Dispense:  30 tablet    Refill:  5  . metoprolol tartrate (LOPRESSOR) 50 MG tablet    Sig: Take 1 tablet (50 mg total) by mouth 2 (two) times daily.    Dispense:  180 tablet    Refill:  0  . cyclobenzaprine (FLEXERIL) 5 MG tablet    Sig: TAKE 1 TABLET BY MOUTH TWICE A DAY AS NEEDED FOR MUSCLE SPASMS    Dispense:  30 tablet    Refill:  1  . alendronate (FOSAMAX) 70 MG tablet    Sig: TAKE 1 TABLET BY MOUTH EVERY 7 DAYS. TAKE WITH A FULL GLASS OF WATER ON AN EMPTY STOMACH.    Dispense:  13 tablet    Refill:  3   Return precautions advised.  Tana Conch, MD

## 2017-06-30 NOTE — Progress Notes (Signed)
Your CBC was slightly abnormal (blood counts, infection fighting cells, platelets). Your infection fighting cells were a hair high but they have been in the past and then went back to normal- lets plan on rechecking in 6 months. Platelets were a hair high but no major concern and they have done that in the past Your CMET was normal (kidney, liver, and electrolytes, blood sugar) except for blood sugar being slightly high and higher than last year by a few points. Luckily- your a1c does not show an increased risk of diabetes this year! Great news.  Your cholesterol looks great

## 2017-06-30 NOTE — Assessment & Plan Note (Signed)
S: controlled on metoprolol 50 mg twice a day and Lasix 40 mg daily. BP Readings from Last 3 Encounters:  06/30/17 116/78  04/21/16 122/70  04/07/16 128/70  A/P: We discussed blood pressure goal of <140/90. Continue current meds

## 2017-06-30 NOTE — Assessment & Plan Note (Signed)
Stable on lasix . No increased swelling or shortness of breath.

## 2017-07-01 ENCOUNTER — Telehealth: Payer: Self-pay

## 2017-07-01 LAB — URINALYSIS, MICROSCOPIC ONLY

## 2017-07-01 NOTE — Telephone Encounter (Signed)
Called patient and gave her lab results. No follow up questions. Patient verbalized understanding.

## 2017-07-01 NOTE — Telephone Encounter (Signed)
PA for cyclobenzaprine initiated on 06/30/2017 through Cover My Meds.  Awaiting insurance decision.

## 2017-07-02 ENCOUNTER — Telehealth: Payer: Self-pay

## 2017-07-02 NOTE — Telephone Encounter (Signed)
Received record that Sheena [name] has approved coverage for Cyclobenzaprine HCL effective date 02/28/2017 through 03/01/2018.

## 2017-07-06 NOTE — Telephone Encounter (Signed)
PA approved.  See other telephone message.

## 2017-07-12 ENCOUNTER — Other Ambulatory Visit: Payer: Self-pay | Admitting: Family Medicine

## 2017-08-30 ENCOUNTER — Other Ambulatory Visit: Payer: Self-pay

## 2017-08-30 NOTE — Telephone Encounter (Signed)
Sheena [name] from last low back pain note- "she has about 30 tramadol left from a fill last year- discussed would be reasonable to use." from May. Sheena [name]- can you print NCCSRS for me to review and also ask patient why sparing ue has suddenly become so frequent? Want to make sure we dont need to see her before this refill

## 2017-09-01 MED ORDER — TRAMADOL HCL 50 MG PO TABS: 50.0000 mg | ORAL_TABLET | Freq: Four times a day (QID) | ORAL | 1 refills | Status: DC | PRN

## 2017-09-01 NOTE — Telephone Encounter (Signed)
Tell her congrats on quitting smoking! I sent in a refill for her. Want her to use this very sparingly

## 2017-09-01 NOTE — Telephone Encounter (Signed)
Called and spoke to patient. She had a flare up a couple of weeks ago. She only had a couple of pills left when she came in last she said. She stated after not being able to sleep for 2 nights she took the medication which she says really helped her. She feels like the weather contributed to her flare up. She stated she felt like it was nerve pain. She is currently not having pain but just wanted the refill to have on hand for her next flare up.  She also states she stopped smoking on Jul 09, 2017.

## 2017-09-18 ENCOUNTER — Other Ambulatory Visit: Payer: Self-pay | Admitting: Family Medicine

## 2017-09-29 ENCOUNTER — Other Ambulatory Visit: Payer: Self-pay

## 2017-11-22 ENCOUNTER — Other Ambulatory Visit: Payer: Self-pay | Admitting: Family Medicine

## 2017-11-29 DIAGNOSIS — Z803 Family history of malignant neoplasm of breast: Secondary | ICD-10-CM | POA: Diagnosis not present

## 2017-11-29 DIAGNOSIS — Z1231 Encounter for screening mammogram for malignant neoplasm of breast: Secondary | ICD-10-CM | POA: Diagnosis not present

## 2017-11-29 LAB — HM MAMMOGRAPHY

## 2017-12-02 ENCOUNTER — Encounter: Payer: Self-pay | Admitting: Family Medicine

## 2017-12-31 ENCOUNTER — Ambulatory Visit (INDEPENDENT_AMBULATORY_CARE_PROVIDER_SITE_OTHER): Payer: Medicare Other | Admitting: Family Medicine

## 2017-12-31 ENCOUNTER — Encounter: Payer: Self-pay | Admitting: Family Medicine

## 2017-12-31 DIAGNOSIS — I10 Essential (primary) hypertension: Secondary | ICD-10-CM

## 2017-12-31 DIAGNOSIS — F17209 Nicotine dependence, unspecified, with unspecified nicotine-induced disorders: Secondary | ICD-10-CM | POA: Diagnosis not present

## 2017-12-31 DIAGNOSIS — I119 Hypertensive heart disease without heart failure: Secondary | ICD-10-CM

## 2017-12-31 DIAGNOSIS — E785 Hyperlipidemia, unspecified: Secondary | ICD-10-CM | POA: Diagnosis not present

## 2017-12-31 DIAGNOSIS — I5189 Other ill-defined heart diseases: Secondary | ICD-10-CM

## 2017-12-31 DIAGNOSIS — M810 Age-related osteoporosis without current pathological fracture: Secondary | ICD-10-CM | POA: Diagnosis not present

## 2017-12-31 DIAGNOSIS — I519 Heart disease, unspecified: Secondary | ICD-10-CM

## 2017-12-31 NOTE — Assessment & Plan Note (Signed)
S: reasonably controlled on atorvastatin 20mg  daily. Given long term smoker- can also on aspirin to further reduce her risk Lab Results  Component Value Date   CHOL 152 06/30/2017   HDL 57.70 06/30/2017   LDLCALC 66 06/30/2017   LDLDIRECT 189.3 03/05/2014   TRIG 144.0 06/30/2017   CHOLHDL 3 06/30/2017   A/P: stable- continue current medications.

## 2017-12-31 NOTE — Assessment & Plan Note (Signed)
S: She has never been hospitalized in relation to heart failure-we will change this diagnosis from heart failure to grade 2 diastolic dysfunction.  She does get significant edema off of Lasix 40 mg A/P: Stable-continue Lasix.  Update bmet.

## 2017-12-31 NOTE — Assessment & Plan Note (Signed)
S: POOR control on  metoprolol 50mg  BID and lasix 40mg  daily. She admits to stress from bad storm last night, having to get up early, and did have coffee right before coming in.  BP Readings from Last 3 Encounters:  12/31/17 (!) 156/80  06/30/17 116/78  04/21/16 122/70  A/P: We discussed blood pressure goal of <140/90. Continue current meds:  For now- we discussed home monitoring and sooner follow up if elevated - also recheck 6 months- make adjustments if needed at that time. Hoping weight is not trending up with weight gain as I actually think this higher weight is healthier for her.

## 2017-12-31 NOTE — Patient Instructions (Signed)
Declined flu shot  No changes today- glad you are doing so well! Great job on mammogram as well  Congrats on quitting smoking!!!!!!! Awesome job.   I am actually happy you gained some weight as well.   Consider updating bone density next time.   Blood pressure up some today but has been so good in the past so lets just repeat next visit. If you want to do some home monitoring- buy a home cuff and you notice over 140/90 could see me sooner but im hoping it comes back down. Perhaps avoid coffee before next visit

## 2017-12-31 NOTE — Assessment & Plan Note (Signed)
S: She continued to smoke about  half pack per day last visit.  Over 40 pack years. She quit smoking again may 10th- congratulated her on this!  A/P: UA reassuring last visit. . congratulted patient- encouraged her to remain off! Quit cold [place]. Some weight gain- BMI is now in healthier range.   Next visit - need to offer lung cancer screening program

## 2017-12-31 NOTE — Progress Notes (Signed)
Subjective:  Sheena [name] is a 72 y.o. year old very pleasant female patient who presents for/with See problem oriented charting ROS- No chest pain or shortness of breath. No headache or blurry vision.    Past Medical History-  Patient Active Problem List   Diagnosis Date Noted  . Grade II diastolic dysfunction 03/06/2015    Priority: High  . Tobacco use disorder, continuous 02/19/2014    Priority: High  . Disequilibrium 02/19/2014    Priority: High  . Hyperlipidemia 03/05/2014    Priority: Medium  . Chronic low back pain 12/13/2012    Priority: Medium  . Essential hypertension, benign 09/03/2012    Priority: Medium  . Ataxia 04/01/2011    Priority: Medium  . Osteoporosis 03/21/2010    Priority: Medium  . MURMUR 03/21/2010    Priority: Medium  . Hyperglycemia 03/21/2010    Priority: Medium  . Vertebral compression fracture (HCC) 01/04/2013    Priority: Low  . Sinusitis, chronic 09/03/2012    Priority: Low  . Anxiety 04/01/2011    Priority: Low  . Blurred vision 03/21/2010    Priority: Low    Medications- reviewed and updated Current Outpatient Medications  Medication Sig Dispense Refill  . acetaminophen (TYLENOL) 500 MG tablet Take 500 mg by mouth 2 (two) times daily as needed for mild pain. Reported on 06/04/2015    . alendronate (FOSAMAX) 70 MG tablet TAKE 1 TABLET BY MOUTH EVERY 7 DAYS. TAKE WITH A FULL GLASS OF WATER ON AN EMPTY STOMACH. 13 tablet 3  . aspirin 81 MG tablet Take 81 mg by mouth daily. Twice a week    . atorvastatin (LIPITOR) 20 MG tablet Take 1 tablet (20 mg total) by mouth daily. 90 tablet 3  . cyclobenzaprine (FLEXERIL) 5 MG tablet TAKE 1 TABLET BY MOUTH TWICE A DAY AS NEEDED FOR MUSCLE SPASMS 30 tablet 1  . furosemide (LASIX) 40 MG tablet Take 1 tablet (40 mg total) by mouth daily. 30 tablet 5  . metoprolol tartrate (LOPRESSOR) 50 MG tablet TAKE 1 TABLET BY MOUTH TWICE A DAY 180 tablet 1   Objective: BP (!) 156/80 (BP Location: Left Arm, Patient  Position: Sitting, Cuff Size: Large)   Pulse 62   Temp (!) 97.5 F (36.4 C) (Oral)   Ht 5' (1.524 m)   Wt 107 lb 12.8 oz (48.9 kg)   SpO2 98%   BMI 21.05 kg/m  Gen: NAD, resting comfortably CV: RRR no murmurs rubs or gallops Lungs: CTAB no crackles, wheeze, rhonchi Abdomen: soft/nontender/nondistended/normal bowel sounds.  Ext: no edema Skin: warm, dry  Assessment/Plan:  Other notes: 1.mild wbc elevation last visit- suggested CBC with diff today- she opts to defer to next visit   Grade II diastolic dysfunction S: She has never been hospitalized in relation to heart failure-we will change this diagnosis from heart failure to grade 2 diastolic dysfunction.  She does get significant edema off of Lasix 40 mg A/P: Stable-continue Lasix.  Update bmet.   Hyperlipidemia S: reasonably controlled on atorvastatin 20mg  daily. Given long term smoker- can also on aspirin to further reduce her risk Lab Results  Component Value Date   CHOL 152 06/30/2017   HDL 57.70 06/30/2017   LDLCALC 66 06/30/2017   LDLDIRECT 189.3 03/05/2014   TRIG 144.0 06/30/2017   CHOLHDL 3 06/30/2017   A/P: stable- continue current medications.   Osteoporosis S: osteoporosis- remains on treatment with fosamax. She also makes sure to take at least 1200mg  calcium per day and 800  units of vitamin D per day.  A/P:  Stable. Last bone density 01/2016- plan for repeat at next visit. Continue current meds  Essential hypertension, benign S: POOR control on  metoprolol 50mg  BID and lasix 40mg  daily. She admits to stress from bad storm last night, having to get up early, and did have coffee right before coming in.  BP Readings from Last 3 Encounters:  12/31/17 (!) 156/80  06/30/17 116/78  04/21/16 122/70  A/P: We discussed blood pressure goal of <140/90. Continue current meds:  For now- we discussed home monitoring and sooner follow up if elevated - also recheck 6 months- make adjustments if needed at that time. Hoping  weight is not trending up with weight gain as I actually think this higher weight is healthier for her.   Tobacco use disorder, continuous S: She continued to smoke about  half pack per day last visit.  Over 40 pack years. She quit smoking again may 10th- congratulated her on this!  A/P: UA reassuring last visit. . congratulted patient- encouraged her to remain off! Quit cold [place]. Some weight gain- BMI is now in healthier range.   Next visit - need to offer lung cancer screening program   Future Appointments  Date Time Provider Department Center  07/01/2018 10:40 AM [name], MD [facility]   Return in about 6 months (around 07/01/2018) for follow up- or sooner if needed. come fasting. .  Return precautions advised.  Tana Conch, MD

## 2017-12-31 NOTE — Assessment & Plan Note (Signed)
S: osteoporosis- remains on treatment with fosamax. She also makes sure to take at least 1200mg  calcium per day and 800 units of vitamin D per day.  A/P:  Stable. Last bone density 01/2016- plan for repeat at next visit. Continue current meds

## 2018-01-14 ENCOUNTER — Other Ambulatory Visit: Payer: Self-pay | Admitting: Family Medicine

## 2018-06-07 ENCOUNTER — Other Ambulatory Visit: Payer: Self-pay

## 2018-06-07 ENCOUNTER — Other Ambulatory Visit: Payer: Self-pay | Admitting: Family Medicine

## 2018-06-07 MED ORDER — METOPROLOL TARTRATE 50 MG PO TABS: 50.0000 mg | ORAL_TABLET | Freq: Two times a day (BID) | ORAL | 0 refills | Status: DC

## 2018-06-07 MED ORDER — ALENDRONATE SODIUM 70 MG PO TABS: ORAL_TABLET | ORAL | 0 refills | Status: DC

## 2018-06-07 MED ORDER — ATORVASTATIN CALCIUM 20 MG PO TABS: 20.0000 mg | ORAL_TABLET | Freq: Every day | ORAL | 0 refills | Status: DC

## 2018-06-07 MED ORDER — FUROSEMIDE 40 MG PO TABS: 40.0000 mg | ORAL_TABLET | Freq: Every day | ORAL | 0 refills | Status: DC

## 2018-06-07 NOTE — Telephone Encounter (Signed)
See note

## 2018-06-07 NOTE — Telephone Encounter (Signed)
Created in error

## 2018-06-07 NOTE — Telephone Encounter (Signed)
Copied from CRM [id number]. Topic: Quick Communication - Rx Refill/Question >> Jun 07, 2018  1:14 PM Fanny Bien wrote: Medication:metoprolol tartrate (LOPRESSOR) 50 MG tablet [[id number]] alendronate (FOSAMAX) 70 MG tablet [[id number]] atorvastatin (LIPITOR) 20 MG tablet [[id number]] furosemide (LASIX) 40 MG tablet [[id number]]   Has the patient contacted their pharmacy?  Preferred Pharmacy (with phone number or street name): [id number] [place], [place] - [address] AT [facility] [address] [phone] (Phone) [phone] (Fax)    Agent: Please be advised that RX refills may take up to 3 business days. We ask that you follow-up with your pharmacy.

## 2018-06-30 ENCOUNTER — Encounter: Payer: Self-pay | Admitting: Family Medicine

## 2018-06-30 ENCOUNTER — Ambulatory Visit (INDEPENDENT_AMBULATORY_CARE_PROVIDER_SITE_OTHER): Payer: Medicare Other | Admitting: Family Medicine

## 2018-06-30 VITALS — Ht 60.0 in | Wt 107.8 lb

## 2018-06-30 DIAGNOSIS — Z87891 Personal history of nicotine dependence: Secondary | ICD-10-CM | POA: Diagnosis not present

## 2018-06-30 DIAGNOSIS — I519 Heart disease, unspecified: Secondary | ICD-10-CM

## 2018-06-30 DIAGNOSIS — I5189 Other ill-defined heart diseases: Secondary | ICD-10-CM

## 2018-06-30 DIAGNOSIS — I10 Essential (primary) hypertension: Secondary | ICD-10-CM | POA: Diagnosis not present

## 2018-06-30 DIAGNOSIS — E785 Hyperlipidemia, unspecified: Secondary | ICD-10-CM | POA: Diagnosis not present

## 2018-06-30 DIAGNOSIS — M810 Age-related osteoporosis without current pathological fracture: Secondary | ICD-10-CM

## 2018-06-30 NOTE — Patient Instructions (Addendum)
There are no preventive care reminders to display for this patient.  Phone visit

## 2018-06-30 NOTE — Progress Notes (Signed)
Phone [phone]   Subjective:  Virtual visit via phonenote Chief Complaint  Patient presents with  . Follow-up    6 month f/u    This visit type was conducted due to national recommendations for restrictions regarding the COVID-19 Pandemic (e.g. social distancing).  This format is felt to be most appropriate for this patient at this time balancing risks to patient and risks to population by having him in for in person visit.  All issues noted in this document were discussed and addressed.  No physical exam was performed (except for noted visual exam or audio findings with Telehealth visits).  The patient has consented to conduct a Telehealth visit and understands insurance will be billed.   Our team/I connected with Georges Mouseonna Wedemeyer on 06/30/18 at  2:20 PM EDT by phone (patient did not have equipment for webex) and verified that I am speaking with the correct person using two identifiers.  Location patient: [facility] Location provider: [facility], office Persons participating in the virtual visit:  patient  Time on phone: 14 minutes Counseling provided about covid 19, blood pressure goals  Our team/I discussed the limitations of evaluation and management by telemedicine and the availability of in person appointments. In light of current covid-19 pandemic, patient also understands that we are trying to protect them by minimizing in office contact if at all possible.  The patient expressed consent for telemedicine visit and agreed to proceed. Patient understands insurance will be billed.   ROS- No chest pain or shortness of breath. No headache or blurry vision.    Past Medical History-  Patient Active Problem List   Diagnosis Date Noted  . Grade II diastolic dysfunction 03/06/2015    Priority: High  . Former smoker 02/19/2014    Priority: High  . Disequilibrium 02/19/2014    Priority: High  . Hyperlipidemia 03/05/2014    Priority: Medium  . Chronic low back pain 12/13/2012   Priority: Medium  . Essential hypertension, benign 09/03/2012    Priority: Medium  . Ataxia 04/01/2011    Priority: Medium  . Osteoporosis 03/21/2010    Priority: Medium  . MURMUR 03/21/2010    Priority: Medium  . Hyperglycemia 03/21/2010    Priority: Medium  . Vertebral compression fracture (HCC) 01/04/2013    Priority: Low  . Sinusitis, chronic 09/03/2012    Priority: Low  . Anxiety 04/01/2011    Priority: Low  . Blurred vision 03/21/2010    Priority: Low    Medications- reviewed and updated Current Outpatient Medications  Medication Sig Dispense Refill  . acetaminophen (TYLENOL) 500 MG tablet Take 500 mg by mouth 2 (two) times daily as needed for mild pain. Reported on 06/04/2015    . alendronate (FOSAMAX) 70 MG tablet TAKE 1 TABLET BY MOUTH EVERY 7 DAYS. TAKE WITH A FULL GLASS OF WATER ON AN EMPTY STOMACH. 13 tablet 0  . aspirin 81 MG tablet Take 81 mg by mouth daily. Twice a week    . atorvastatin (LIPITOR) 20 MG tablet Take 1 tablet (20 mg total) by mouth daily. 30 tablet 0  . cyclobenzaprine (FLEXERIL) 5 MG tablet TAKE 1 TABLET BY MOUTH TWICE A DAY AS NEEDED FOR MUSCLE SPASMS 30 tablet 1  . furosemide (LASIX) 40 MG tablet TAKE 1 TABLET BY MOUTH EVERY DAY 90 tablet 1  . metoprolol tartrate (LOPRESSOR) 50 MG tablet TAKE 1 TABLET BY MOUTH TWICE A DAY 180 tablet 1   No current facility-administered medications for this visit.  Objective:  Ht 5' (1.524 m)   Wt 107 lb 12.8 oz (48.9 kg)   BMI 21.05 kg/m  self reported vitals  Nonlabored voice, normal speech      Assessment and Plan   # Diastolic dysfunction- grade II S:patient compliant with lasix 40 mg - significant edema off this in the past she has reported  A/P: Stable. Continue current medications.    #hyperlipidemia S:  controlled on atorvastatin 20mg  daily. Due to smoking history- is staying on ASA twice a week for primary prevention Lab Results  Component Value Date   CHOL 152 06/30/2017   HDL 57.70  06/30/2017   LDLCALC 66 06/30/2017   LDLDIRECT 189.3 03/05/2014   TRIG 144.0 06/30/2017   CHOLHDL 3 06/30/2017   A/P: Stable. Continue current medications.   # Osteoporosis S:doing fosamax every Sunday morning- has not had issues. Taking calcium/vitamin D as well.   A/P: stable- she needs updated bone density- we will plan on doing that hopefully at time of next visit in 6 months   # former smoker S:has not smoked a cigarette may 10th of last year- has not restarted.   A/P: doing well off cigarettes.  45 pack years- wants to think over the lung cancer screening program and we will talk next visit.   #hypertension S: controlled poorly on metoprolol and lasix in past BP Readings from Last 3 Encounters:  12/31/17 (!) 156/80  06/30/17 116/78  04/21/16 122/70  A/P: patient did not get a home cuff as planned- she agrees to do so at this point and give [place] an update next month. I set a reminder to check on this in 30 days in case we do not hear from patient.   Other notes: 1.covid 19 education - staying home for the most part. Husband getting groceries and wearing mask then comes home.   She is going to set up a visit for November or so. She declines flu shot in advance! Future Appointments  Date Time Provider Department Center  01/02/2019 11:20 AM [name], MD [facility]   Lab/Order associations: Grade II diastolic dysfunction  Former smoker  Essential hypertension, benign  Hyperlipidemia, unspecified hyperlipidemia type  Age-related osteoporosis without current pathological fracture  Return precautions advised.  Tana Conch, MD

## 2018-07-01 ENCOUNTER — Ambulatory Visit: Payer: Medicare Other | Admitting: Family Medicine

## 2018-07-11 ENCOUNTER — Telehealth: Payer: Self-pay | Admitting: Family Medicine

## 2018-07-11 NOTE — Telephone Encounter (Signed)
Called pt husband back and he stated that [facility] [employer] does not have the license to bi;ll medicaid. Pt stated that he just went and pick one up OTC. No further action needed!

## 2018-07-11 NOTE — Telephone Encounter (Signed)
Spoke to Amy at he pharmacy and she stated that Pt insurance does not  pay for pt Bp machine. Amy stated she advised pt to call [facility] medical to see if it would be covered. I called pt husband and gave him the number to [facility]. Pt husband will call and get more information and call back.

## 2018-07-11 NOTE — Telephone Encounter (Signed)
See note  Copied from CRM [id number]. Topic: General - Inquiry >> Jul 11, 2018  9:31 AM Lynne Logan D wrote: Reason for CRM: Pt's husband Raiford Noble stated that Dr. Durene Cal recommended a blood pressure monitor and Medicaid will cover it 100%. They need Dr. Durene Cal to send in a rx for the kind he recommended to the pharmacy. Please advise. Requesting a call once this has been done. DI#[id number]  [id number] [place], [place] - [address] AT [address] [phone] (Phone) [phone] (Fax)

## 2018-08-27 ENCOUNTER — Other Ambulatory Visit: Payer: Self-pay | Admitting: Family Medicine

## 2018-09-06 ENCOUNTER — Other Ambulatory Visit: Payer: Self-pay | Admitting: Family Medicine

## 2018-11-11 ENCOUNTER — Other Ambulatory Visit: Payer: Self-pay

## 2018-11-11 ENCOUNTER — Encounter (INDEPENDENT_AMBULATORY_CARE_PROVIDER_SITE_OTHER): Payer: Self-pay

## 2018-11-11 ENCOUNTER — Ambulatory Visit (INDEPENDENT_AMBULATORY_CARE_PROVIDER_SITE_OTHER): Payer: Medicare Other

## 2018-11-11 DIAGNOSIS — Z0000 Encounter for general adult medical examination without abnormal findings: Secondary | ICD-10-CM

## 2018-11-11 NOTE — Patient Instructions (Signed)
Sheena [name] , Thank you for taking time to come for your Medicare Wellness Visit. I appreciate your ongoing commitment to your health goals. Please review the following plan we discussed and let me know if I can assist you in the future.   Screening recommendations/referrals: Colorectal Screening: recommended; information on Cologuard included  Mammogram: last 11/29/17 Bone Density: recommended; please review attached information   Vision and Dental Exams: Recommended annual ophthalmology exams for early detection of glaucoma and other disorders of the eye Recommended annual dental exams for proper oral hygiene  Vaccinations: Influenza vaccine: recommended  Pneumococcal vaccine: recommended  Tdap vaccine: Please call your insurance company to determine your out of pocket expense. You may also receive this vaccine at your local pharmacy or Health Dept. Shingles vaccine: Please call your insurance company to determine your out of pocket expense for the Shingrix vaccine. You may receive this vaccine at your local pharmacy.  Advanced directives:  Information and forms available if interested; also I am available to help in the completion of forms as well   Goals: Recommend to drink at least 6-8 8oz glasses of water per day and incorporate exercise for at least 150 minutes per week.  Next appointment: Please schedule your Annual Wellness Visit with your Nurse Health Advisor in one year.  Preventive Care 46 Years and Older, Female Preventive care refers to lifestyle choices and visits with your health care provider that can promote health and wellness. What does preventive care include?  A yearly physical exam. This is also called an annual well check.  Dental exams once or twice a year.  Routine eye exams. Ask your health care provider how often you should have your eyes checked.  Personal lifestyle choices, including:  Daily care of your teeth and gums.  Regular physical activity.   Eating a healthy diet.  Avoiding tobacco and drug use.  Limiting alcohol use.  Practicing safe sex.  Taking low-dose aspirin every day if recommended by your health care provider.  Taking vitamin and mineral supplements as recommended by your health care provider. What happens during an annual well check? The services and screenings done by your health care provider during your annual well check will depend on your age, overall health, lifestyle risk factors, and family history of disease. Counseling  Your health care provider may ask you questions about your:  Alcohol use.  Tobacco use.  Drug use.  Emotional well-being.  Home and relationship well-being.  Sexual activity.  Eating habits.  History of falls.  Memory and ability to understand (cognition).  Work and work Astronomer.  Reproductive health. Screening  You may have the following tests or measurements:  Height, weight, and BMI.  Blood pressure.  Lipid and cholesterol levels. These may be checked every 5 years, or more frequently if you are over 11 years old.  Skin check.  Lung cancer screening. You may have this screening every year starting at age 70 if you have a 30-pack-year history of smoking and currently smoke or have quit within the past 15 years.  Fecal occult blood test (FOBT) of the stool. You may have this test every year starting at age 85.  Flexible sigmoidoscopy or colonoscopy. You may have a sigmoidoscopy every 5 years or a colonoscopy every 10 years starting at age 10.  Hepatitis C blood test.  Hepatitis B blood test.  Sexually transmitted disease (STD) testing.  Diabetes screening. This is done by checking your blood sugar (glucose) after you have not eaten  for a while (fasting). You may have this done every 1-3 years.  Bone density scan. This is done to screen for osteoporosis. You may have this done starting at age 71.  Mammogram. This may be done every 1-2 years. Talk to  your health care provider about how often you should have regular mammograms. Talk with your health care provider about your test results, treatment options, and if necessary, the need for more tests. Vaccines  Your health care provider may recommend certain vaccines, such as:  Influenza vaccine. This is recommended every year.  Tetanus, diphtheria, and acellular pertussis (Tdap, Td) vaccine. You may need a Td booster every 10 years.  Zoster vaccine. You may need this after age 71.  Pneumococcal 13-valent conjugate (PCV13) vaccine. One dose is recommended after age 71.  Pneumococcal polysaccharide (PPSV23) vaccine. One dose is recommended after age 71. Talk to your health care provider about which screenings and vaccines you need and how often you need them. This information is not intended to replace advice given to you by your health care provider. Make sure you discuss any questions you have with your health care provider. Document Released: 03/15/2015 Document Revised: 11/06/2015 Document Reviewed: 12/18/2014 Elsevier Interactive Patient Education  2017 [employer]  Fall Prevention in the Home Falls can cause injuries. They can happen to people of all ages. There are many things you can do to make your home safe and to help prevent falls. What can I do on the outside of my home?  Regularly fix the edges of walkways and driveways and fix any cracks.  Remove anything that might make you trip as you walk through a door, such as a raised step or threshold.  Trim any bushes or trees on the path to your home.  Use bright outdoor lighting.  Clear any walking paths of anything that might make someone trip, such as rocks or tools.  Regularly check to see if handrails are loose or broken. Make sure that both sides of any steps have handrails.  Any raised decks and porches should have guardrails on the edges.  Have any leaves, snow, or ice cleared regularly.  Use sand or salt on  walking paths during winter.  Clean up any spills in your garage right away. This includes oil or grease spills. What can I do in the bathroom?  Use night lights.  Install grab bars by the toilet and in the tub and shower. Do not use towel bars as grab bars.  Use non-skid mats or decals in the tub or shower.  If you need to sit down in the shower, use a plastic, non-slip stool.  Keep the floor dry. Clean up any water that spills on the floor as soon as it happens.  Remove soap buildup in the tub or shower regularly.  Attach bath mats securely with double-sided non-slip rug tape.  Do not have throw rugs and other things on the floor that can make you trip. What can I do in the bedroom?  Use night lights.  Make sure that you have a light by your bed that is easy to reach.  Do not use any sheets or blankets that are too big for your bed. They should not hang down onto the floor.  Have a firm chair that has side arms. You can use this for support while you get dressed.  Do not have throw rugs and other things on the floor that can make you trip. What can I do  in the kitchen?  Clean up any spills right away.  Avoid walking on wet floors.  Keep items that you use a lot in easy-to-reach places.  If you need to reach something above you, use a strong step stool that has a grab bar.  Keep electrical cords out of the way.  Do not use floor polish or wax that makes floors slippery. If you must use wax, use non-skid floor wax.  Do not have throw rugs and other things on the floor that can make you trip. What can I do with my stairs?  Do not leave any items on the stairs.  Make sure that there are handrails on both sides of the stairs and use them. Fix handrails that are broken or loose. Make sure that handrails are as long as the stairways.  Check any carpeting to make sure that it is firmly attached to the stairs. Fix any carpet that is loose or worn.  Avoid having throw  rugs at the top or bottom of the stairs. If you do have throw rugs, attach them to the floor with carpet tape.  Make sure that you have a light switch at the top of the stairs and the bottom of the stairs. If you do not have them, ask someone to add them for you. What else can I do to help prevent falls?  Wear shoes that:  Do not have high heels.  Have rubber bottoms.  Are comfortable and fit you well.  Are closed at the toe. Do not wear sandals.  If you use a stepladder:  Make sure that it is fully opened. Do not climb a closed stepladder.  Make sure that both sides of the stepladder are locked into place.  Ask someone to hold it for you, if possible.  Clearly mark and make sure that you can see:  Any grab bars or handrails.  First and last steps.  Where the edge of each step is.  Use tools that help you move around (mobility aids) if they are needed. These include:  Canes.  Walkers.  Scooters.  Crutches.  Turn on the lights when you go into a dark area. Replace any light bulbs as soon as they burn out.  Set up your furniture so you have a clear path. Avoid moving your furniture around.  If any of your floors are uneven, fix them.  If there are any pets around you, be aware of where they are.  Review your medicines with your doctor. Some medicines can make you feel dizzy. This can increase your chance of falling. Ask your doctor what other things that you can do to help prevent falls. This information is not intended to replace advice given to you by your health care provider. Make sure you discuss any questions you have with your health care provider. Document Released: 12/13/2008 Document Revised: 07/25/2015 Document Reviewed: 03/23/2014 Elsevier Interactive Patient Education  2017 [employer].

## 2018-11-11 NOTE — Progress Notes (Signed)
This visit is being conducted via phone call due to the COVID-19 pandemic. This patient has given me verbal consent via phone to conduct this visit, patient states they are participating from their home address. Some vital signs may be absent or patient reported.   Patient identification: identified by name, DOB, and current address.   Subjective:   Sheena [name] is a 73 y.o. female who presents for Medicare Annual (Subsequent) preventive examination.  Review of Systems:   Cardiac Risk Factors include: smoking/ tobacco exposure;hypertension;dyslipidemia;advanced age (>9055men, 35>65 women)     Objective:     Vitals: There were no vitals taken for this visit.  There is no height or weight on file to calculate BMI.  Advanced Directives 11/11/2018 04/07/2016 11/20/2015 02/16/2013  Does Patient Have a Medical Advance Directive? No No No Patient does not have advance directive  Would patient like information on creating a medical advance directive? No - Patient declined - No - patient declined information -    Tobacco Social History   Tobacco Use  Smoking Status Former Smoker  . Packs/day: 1.00  . Years: 45.00  . Pack years: 45.00  . Types: Cigarettes  Smokeless Tobacco Never Used     Counseling given: Not Answered   Clinical Intake:  Pre-visit preparation completed: Yes  Pain : No/denies pain  Diabetes: No  Interpreter Needed?: No  Information entered by :: Kandis Fantasiaourtney  LPN  Past Medical History:  Diagnosis Date  . Anxiety   . Heart murmur   . Hypertension   . Scarlet fever with other complications childhood   "had to learn to work again" has had leg weakness   Past Surgical History:  Procedure Laterality Date  . KYPHOPLASTY     2014  . peridontal surgery     Family History  Problem Relation Age of Onset  . Cancer Mother        breast, spine mets  . Hypertension Mother   . Heart disease Mother [id number]       CABG 4 vessel  . COPD Father        emphysema  .  Heart disease Brother [id number]       MI   Social History   Socioeconomic History  . Marital status: Married    Spouse name: Not on file  . Number of children: 4  . Years of education: 10  . Highest education level: Not on file  Occupational History  . Occupation: disability  Social Needs  . Financial resource strain: Not on file  . Food insecurity    Worry: Not on file    Inability: Not on file  . Transportation needs    Medical: Not on file    Non-medical: Not on file  Tobacco Use  . Smoking status: Former Smoker    Packs/day: 1.00    Years: 45.00    Pack years: 45.00    Types: Cigarettes  . Smokeless tobacco: Never Used  Substance and Sexual Activity  . Alcohol use: No  . Drug use: No  . Sexual activity: Not Currently  Lifestyle  . Physical activity    Days per week: Not on file    Minutes per session: Not on file  . Stress: Not on file  Relationships  . Social Musicianconnections    Talks on phone: Not on file    Gets together: Not on file    Attends religious service: Not on file    Active member of club or organization: Not  on file    Attends meetings of clubs or organizations: Not on file    Relationship status: Not on file  Other Topics Concern  . Not on file  Social History Narrative    Divorced (Married - 07-03-66 -59yr/divorced; remarried 1980-07-02) but lives with husband (2nd marriage)-had 1 child together that died in [place]. 3 son 07-03-2067 (died MVC), '73, '84 (died MVC); 1 dtr 07-02-69. 3 grandchildren. 1 greatgrandchildren.  Lives with husband.       10th grade. Quit school to work and help raise nieces. 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    Outpatient Encounter Medications as of 11/11/2018  Medication Sig  . acetaminophen (TYLENOL) 500 MG tablet Take 500 mg by mouth 2 (two) times daily as needed for mild pain. Reported on 06/04/2015  . alendronate (FOSAMAX) 70 MG tablet TAKE 1 TABLET BY MOUTH EVERY 7 DAYS. TAKE WITH A FULL GLASS OF WATER ON AN EMPTY STOMACH.  Marland Kitchen aspirin 81 MG tablet  Take 81 mg by mouth daily. Twice a week  . atorvastatin (LIPITOR) 20 MG tablet TAKE 1 TABLET BY MOUTH EVERY DAY  . cyclobenzaprine (FLEXERIL) 5 MG tablet TAKE 1 TABLET BY MOUTH TWICE A DAY AS NEEDED FOR MUSCLE SPASMS  . furosemide (LASIX) 40 MG tablet TAKE 1 TABLET BY MOUTH EVERY DAY  . metoprolol tartrate (LOPRESSOR) 50 MG tablet TAKE 1 TABLET BY MOUTH TWICE A DAY   No facility-administered encounter medications on file as of 11/11/2018.     Activities of Daily Living In your present state of health, do you have any difficulty performing the following activities: 11/11/2018  Hearing? N  Vision? N  Difficulty concentrating or making decisions? N  Walking or climbing stairs? Y  Comment at times due to back pain  Dressing or bathing? N  Doing errands, shopping? N  Preparing Food and eating ? N  Using the Toilet? N  In the past six months, have you accidently leaked urine? N  Do you have problems with loss of bowel control? N  Managing your Medications? N  Managing your Finances? N  Housekeeping or managing your Housekeeping? N  Some recent data might be hidden    Patient Care Team: Marin Olp, MD as PCP - General (Family Medicine)    Assessment:   This is a routine wellness examination for Sheena [name].  Exercise Activities and Dietary recommendations Current Exercise Habits: The patient does not participate in regular exercise at present  Goals    . patient     Loves to move more but back is stopping you now. Will try to get out more when feeling better     . Quit smoking / using tobacco     Smoking;  Educated to avoid secondary smoke Smoking cessation in [place]: [place] at [phone] -day Smoking cessation in [place]: Evening; [phone] Smoking cessation at [facility]: [phone] Smoking cessation at [facility]: [phone] Classes offered a couple of times a month;  Will work with the patient as far as registration and location  Meds may help; chatix  (Varenicline); Zyban (Bupropion SR); Nicotine Replacement (gum; lozenges; patches; etc.)   30 pack yr smoking hx: Educated regarding LDCT; To discuss with MD at next fup. Also educated on AAA screening for men 65-75 who have smoked         Fall Risk Fall Risk  11/11/2018 06/30/2018 12/31/2017 09/29/2017 04/07/2016  Falls in the past year? 0 0 0 No No  Comment - - - Emmi Telephone  Survey: data to providers prior to load -  Number falls in past yr: 0 0 - - -  Injury with Fall? 0 0 - - -  Follow up Falls evaluation completed;Education provided - - - -   Is the patient's home free of loose throw rugs in walkways, pet beds, electrical cords, etc?   yes      Grab bars in the bathroom? yes      Handrails on the stairs?   yes      Adequate lighting?   Yes   Depression Screen PHQ 2/9 Scores 11/11/2018 06/30/2018 12/31/2017 04/07/2016  PHQ - 2 Score 0 0 0 0  PHQ- 9 Score - 3 - -     Cognitive Function-no cognitive concerns at this time  MMSE - Mini Mental State Exam 04/07/2016  Not completed: (No Data)   Alert? Yes         Normal Appearance? N/a  Oriented to person? Yes           Place? Yes  Time? Yes  Recall of three objects? Yes  Can perform simple calculations? Yes  Displays appropriate judgment? Yes  Can read the correct time from a watch face? Yes      There is no immunization history for the selected administration types on file for this patient.  Qualifies for Shingles Vaccine?Discussed and patient will check with pharmacy for coverage.  Patient education handout provided    Screening Tests Health Maintenance  Topic Date Due  . INFLUENZA VACCINE  02/19/2034 (Originally 10/01/2018)  . COLONOSCOPY  06/03/2065 (Originally 07/12/1997)  . TETANUS/TDAP  06/03/2065 (Originally 07/13/1966)  . PNA vac Low Risk Adult (1 of 2 - PCV13) 06/03/2065 (Originally 07/12/2012)  . MAMMOGRAM  11/30/2019  . DEXA SCAN  Completed  . Hepatitis C Screening  Completed    Cancer Screenings: Lung: Low  Dose CT Chest recommended if Age 52-80 years, 30 pack-year currently smoking OR have quit w/in 15years. Patient does qualify.  Declines at this time  Breast:  Up to date on Mammogram? Yes   Up to date of Bone Density/Dexa? No; recommended  Colorectal: Cologuard information provided      Plan:    I have personally reviewed and addressed the Medicare Annual Wellness questionnaire and have noted the following in the patient's chart:  A. Medical and social history B. Use of alcohol, tobacco or illicit drugs  C. Current medications and supplements D. Functional ability and status E.  Nutritional status F.  Physical activity G. Advance directives H. List of other physicians I.  Hospitalizations, surgeries, and ER visits in previous 12 months J.  Vitals K. Screenings such as hearing and vision if needed, cognitive and depression L. Referrals, records requested, and appointments- none   In addition, I have reviewed and discussed with patient certain preventive protocols, quality metrics, and best practice recommendations. A written personalized care plan for preventive services as well as general preventive health recommendations were provided to patient.   Signed,  Kandis Fantasia, LPN  Nurse Health Advisor   Nurse Notes: no additional

## 2018-11-12 NOTE — Progress Notes (Signed)
I have reviewed and agree with note, evaluation, plan.   Unfortunately patient continues to decline all immunizations  Garret Reddish, MD

## 2018-12-03 ENCOUNTER — Other Ambulatory Visit: Payer: Self-pay | Admitting: Family Medicine

## 2018-12-04 ENCOUNTER — Other Ambulatory Visit: Payer: Self-pay | Admitting: Family Medicine

## 2019-01-02 ENCOUNTER — Ambulatory Visit (INDEPENDENT_AMBULATORY_CARE_PROVIDER_SITE_OTHER): Payer: Medicare Other | Admitting: Family Medicine

## 2019-01-02 ENCOUNTER — Encounter: Payer: Self-pay | Admitting: Family Medicine

## 2019-01-02 VITALS — BP 131/59

## 2019-01-02 DIAGNOSIS — I5189 Other ill-defined heart diseases: Secondary | ICD-10-CM

## 2019-01-02 DIAGNOSIS — I519 Heart disease, unspecified: Secondary | ICD-10-CM | POA: Diagnosis not present

## 2019-01-02 DIAGNOSIS — I10 Essential (primary) hypertension: Secondary | ICD-10-CM

## 2019-01-02 DIAGNOSIS — E785 Hyperlipidemia, unspecified: Secondary | ICD-10-CM

## 2019-01-02 DIAGNOSIS — M810 Age-related osteoporosis without current pathological fracture: Secondary | ICD-10-CM

## 2019-01-02 DIAGNOSIS — R270 Ataxia, unspecified: Secondary | ICD-10-CM

## 2019-01-02 MED ORDER — CYCLOBENZAPRINE HCL 5 MG PO TABS: ORAL_TABLET | ORAL | 1 refills | Status: DC

## 2019-01-02 MED ORDER — AMLODIPINE BESYLATE 2.5 MG PO TABS: 2.5000 mg | ORAL_TABLET | Freq: Every day | ORAL | 5 refills | Status: DC

## 2019-01-02 NOTE — Assessment & Plan Note (Signed)
S: compliant with lasix 40mg  daily and metoprolol 50mg  BID. Pt states she took her BP 3 times yesterday 154/70, within 30 min after eating dinner it was 114/70 and the last was 146/59.  Denies lightheadedness BP Readings from Last 3 Encounters:  01/02/19 (!) 149/71  12/31/17 (!) 156/80  06/30/17 116/78  A/P: poor control with current regimen. She will add amlodipine 2.5 mg to her regimen at night and update me in 2 weeks. If blood pressure gets too low- she can try a half tablet. Our goal is <140/90.  -Really need creatinine and potassium levels given ongoing Lasix-agrees to come in 6 months but is hesitant at present with COVID-19

## 2019-01-02 NOTE — Patient Instructions (Signed)
There are no preventive care reminders to display for this patient. Depression screen [facility] 2/9 11/11/2018 06/30/2018 12/31/2017  Decreased Interest 0 0 0  Down, Depressed, Hopeless 0 0 0  PHQ - 2 Score 0 0 0  Altered sleeping - 3 -  Tired, decreased energy - 0 -  Change in appetite - 0 -  Feeling bad or failure about yourself  - 0 -  Trouble concentrating - 0 -  Moving slowly or fidgety/restless - 0 -  Suicidal thoughts - 0 -  PHQ-9 Score - 3 -  Difficult doing work/chores - Not difficult at all -

## 2019-01-02 NOTE — Progress Notes (Addendum)
Phone [phone]  Subjective:  Virtual visit via phonenote Chief Complaint  Patient presents with   virtual   Follow-up   Hypertension     This visit type was conducted due to national recommendations for restrictions regarding the COVID-19 Pandemic (e.g. social distancing).  This format is felt to be most appropriate for this patient at this time balancing risks to patient and risks to population by having him in for in person visit.  All issues noted in this document were discussed and addressed.  No physical exam was performed (except for noted visual exam or audio findings with Telehealth visits).  The patient has consented to conduct a Telehealth visit and understands insurance will be billed.   Our team/I connected with Sheena [name] at 11:20 AM EST by phone (patient did not have equipment for webex) and verified that I am speaking with the correct person using two identifiers.  Location patient: [place] Location provider: [facility], office Persons participating in the virtual visit:  patient  Time on phone: 13 minutes Counseling provided about blood pressure goals, need for in person visit and labs in the future  Our team/I discussed the limitations of evaluation and management by telemedicine and the availability of in person appointments. In light of current covid-19 pandemic, patient also understands that we are trying to protect them by minimizing in office contact if at all possible.  The patient expressed consent for telemedicine visit and agreed to proceed. Patient understands insurance will be billed.   ROS- PT denies HA, blurred vision and chest discomfort. Baseline lightheadedness. No fever/chills.    Past Medical History-  Patient Active Problem List   Diagnosis Date Noted   Grade II diastolic dysfunction 03/06/2015    Priority: High   Former smoker 02/19/2014    Priority: High   Disequilibrium 02/19/2014    Priority: High   Hyperlipidemia 03/05/2014      Priority: Medium   Chronic low back pain 12/13/2012    Priority: Medium   Essential hypertension, benign 09/03/2012    Priority: Medium   Ataxia 04/01/2011    Priority: Medium   Osteoporosis 03/21/2010    Priority: Medium   MURMUR 03/21/2010    Priority: Medium   Hyperglycemia 03/21/2010    Priority: Medium   Vertebral compression fracture (HCC) 01/04/2013    Priority: Low   Sinusitis, chronic 09/03/2012    Priority: Low   Anxiety 04/01/2011    Priority: Low   Blurred vision 03/21/2010    Priority: Low    Medications- reviewed and updated Current Outpatient Medications  Medication Sig Dispense Refill   acetaminophen (TYLENOL) 500 MG tablet Take 500 mg by mouth 2 (two) times daily as needed for mild pain. Reported on 06/04/2015     alendronate (FOSAMAX) 70 MG tablet TAKE 1 TABLET BY MOUTH EVERY 7 DAYS. TAKE WITH A FULL GLASS OF WATER ON AN EMPTY STOMACH. 12 tablet 0   aspirin 81 MG tablet Take 81 mg by mouth daily. Twice a week     atorvastatin (LIPITOR) 20 MG tablet TAKE 1 TABLET BY MOUTH EVERY DAY 90 tablet 1   cyclobenzaprine (FLEXERIL) 5 MG tablet TAKE 1 TABLET BY MOUTH TWICE A DAY AS NEEDED FOR MUSCLE SPASMS 30 tablet 1   furosemide (LASIX) 40 MG tablet TAKE 1 TABLET BY MOUTH EVERY DAY 90 tablet 1   metoprolol tartrate (LOPRESSOR) 50 MG tablet TAKE 1 TABLET BY MOUTH TWICE A DAY 180 tablet 1   amLODipine (NORVASC) 2.5 MG tablet Take  1 tablet (2.5 mg total) by mouth daily. 30 tablet 5   No current facility-administered medications for this visit.      Objective:  BP (!) 149/71  self reported vitals Nonlabored voice, normal speech     Assessment and Plan   #hypertension S: compliant with lasix 40mg  daily and metoprolol 50mg  BID. Pt states she took her BP 3 times yesterday 154/70, within 30 min after eating dinner it was 114/70 and the last was 146/59.  Denies lightheadedness BP Readings from Last 3 Encounters:  01/02/19 (!) 149/71  12/31/17 (!)  156/80  06/30/17 116/78  A/P: poor control with current regimen. She will add amlodipine 2.5 mg to her regimen at night and update me in 2 weeks. If blood pressure gets too low- she can try a half tablet. Our goal is <140/90.  -Really need creatinine and potassium levels given ongoing Lasix-agrees to come in 6 months but is hesitant at present with COVID-19 -addendum: BP (!) 131/59  Bp improved when she called back  #Ataxia/disequilibrium-balance issues since at least 2012-reassuring neuroimaging in the past.  She will watch this cautiously as she can increase his blood pressure medicine  #Diastolic dysfunction-remains on Lasix and denies any increased edema-not formally diagnosed as CHF  #Osteoporosis-compliant with calcium, vitamin D, Fosamax.  Continue current medication  #Hyperlipidemia-compliant with atorvastatin 20 mg.  She agrees to 1-month in person visit we will update lipid panel, CBC, CMP  #Intermittent back pain-has good relief with Flexeril-request refill for as needed basis  #Hyperglycemia-get A1c at follow-up.  But within normal range last visit  Lab Results  Component Value Date   HGBA1C 5.6 06/30/2017   HGBA1C 5.8 06/04/2015   HGBA1C 5.6 04/01/2011   Recommended follow up: 64-month in person visit recommended  Lab/Order associations:   ICD-10-CM   1. Essential hypertension, benign  I10   2. Ataxia  R27.0   3. Hyperlipidemia, unspecified hyperlipidemia type  E78.5   4. Age-related osteoporosis without current pathological fracture  M81.0   5. Grade II diastolic dysfunction  J03.1     Meds ordered this encounter  Medications   amLODipine (NORVASC) 2.5 MG tablet    Sig: Take 1 tablet (2.5 mg total) by mouth daily.    Dispense:  30 tablet    Refill:  5   cyclobenzaprine (FLEXERIL) 5 MG tablet    Sig: TAKE 1 TABLET BY MOUTH TWICE A DAY AS NEEDED FOR MUSCLE SPASMS    Dispense:  30 tablet    Refill:  1    Return precautions advised.  Garret Reddish, MD

## 2019-01-12 ENCOUNTER — Other Ambulatory Visit: Payer: Self-pay | Admitting: Family Medicine

## 2019-01-16 ENCOUNTER — Telehealth: Payer: Self-pay | Admitting: Family Medicine

## 2019-01-16 NOTE — Telephone Encounter (Signed)
Blood pressures look good overall considering the average -please tell her thank you for sending these in

## 2019-01-16 NOTE — Telephone Encounter (Signed)
Patient called in was told to keep record of blood pressure though out  the next couple days    Nov 3rd 131 over 59  Nov 4th 145 over 63 Nov 5th 108 over 57  Nov 6th 123 over 59 ( patient noticed heart rate on this day heart rate 74) Nov 7th 121 over 64 heart rate 71 Nov 8th 143 over 62 heart rate 65 Nov 9th 127 over 57 heart rate 67 Nov 10th missed Nov 11th 115 over 62 heart rate 73 Nov 12th 128 over 54 heart rate 69 Nov 13th missed Nov 14th 135 over 54 heart rate 63 Nov 15th 124 over 56 heart rate 71

## 2019-01-16 NOTE — Telephone Encounter (Signed)
See BP below.

## 2019-01-16 NOTE — Telephone Encounter (Signed)
See note

## 2019-01-17 NOTE — Telephone Encounter (Signed)
Patient called back instructions given will call if any issues.

## 2019-01-17 NOTE — Telephone Encounter (Signed)
Left message to return call to our office.  

## 2019-02-17 ENCOUNTER — Other Ambulatory Visit: Payer: Self-pay | Admitting: Family Medicine

## 2019-02-19 ENCOUNTER — Other Ambulatory Visit: Payer: Self-pay | Admitting: Family Medicine

## 2019-02-21 ENCOUNTER — Other Ambulatory Visit: Payer: Self-pay | Admitting: Family Medicine

## 2019-02-21 NOTE — Telephone Encounter (Signed)
See note

## 2019-02-21 NOTE — Telephone Encounter (Signed)
Pt called for an update on the Rx refill request. Pt requests call back °

## 2019-02-22 NOTE — Telephone Encounter (Signed)
Pt calling again and is requesting to have an update on this medication. Please advise.

## 2019-02-22 NOTE — Telephone Encounter (Signed)
See note

## 2019-05-30 ENCOUNTER — Other Ambulatory Visit: Payer: Self-pay | Admitting: Family Medicine

## 2019-06-15 ENCOUNTER — Other Ambulatory Visit: Payer: Self-pay | Admitting: Family Medicine

## 2019-06-18 ENCOUNTER — Other Ambulatory Visit: Payer: Self-pay | Admitting: Family Medicine

## 2019-07-03 ENCOUNTER — Other Ambulatory Visit: Payer: Self-pay

## 2019-07-03 ENCOUNTER — Encounter: Payer: Self-pay | Admitting: Family Medicine

## 2019-07-03 ENCOUNTER — Ambulatory Visit (INDEPENDENT_AMBULATORY_CARE_PROVIDER_SITE_OTHER): Payer: Medicare Other | Admitting: Family Medicine

## 2019-07-03 VITALS — BP 115/69 | HR 69 | Ht 60.0 in | Wt 107.0 lb

## 2019-07-03 DIAGNOSIS — R739 Hyperglycemia, unspecified: Secondary | ICD-10-CM

## 2019-07-03 DIAGNOSIS — E785 Hyperlipidemia, unspecified: Secondary | ICD-10-CM | POA: Diagnosis not present

## 2019-07-03 DIAGNOSIS — I10 Essential (primary) hypertension: Secondary | ICD-10-CM | POA: Diagnosis not present

## 2019-07-03 DIAGNOSIS — M810 Age-related osteoporosis without current pathological fracture: Secondary | ICD-10-CM | POA: Diagnosis not present

## 2019-07-03 MED ORDER — TRAMADOL HCL 50 MG PO TABS: 25.0000 mg | ORAL_TABLET | Freq: Two times a day (BID) | ORAL | 1 refills | Status: DC | PRN

## 2019-07-03 MED ORDER — ALENDRONATE SODIUM 70 MG PO TABS: ORAL_TABLET | ORAL | 3 refills | Status: DC

## 2019-07-03 MED ORDER — ATORVASTATIN CALCIUM 20 MG PO TABS: 20.0000 mg | ORAL_TABLET | Freq: Every day | ORAL | 3 refills | Status: DC

## 2019-07-03 NOTE — Patient Instructions (Addendum)
Health Maintenance Due  Topic Date Due  . COVID-19 Vaccine (1) declined for now  Never done    Recommended follow up: Return in about 6 months (around 01/03/2020).

## 2019-07-03 NOTE — Progress Notes (Signed)
Phone [phone] Virtual visit via phonenote   Subjective:   Chief Complaint  Patient presents with  . Hypertension   This visit type was conducted due to national recommendations for restrictions regarding the COVID-19 Pandemic (e.g. social distancing).  This format is felt to be most appropriate for this patient at this time balancing risks to patient and risks to population by having him in for in person visit.  All issues noted in this document were discussed and addressed.  No physical exam was performed (except for noted visual exam or audio findings with Telehealth visits).  The patient has consented to conduct a Telehealth visit and understands insurance will be billed.   Our team/I connected with Gaylan Gerold at 11:00 AM EDT by phone (patient did not have equipment for webex) and verified that I am speaking with the correct person using two identifiers.  Location patient: [place] Location provider: [facility], office Persons participating in the virtual visit:  patient  Time on phone: 21 minutes (10:40-11:01 AM)  Our team/I discussed the limitations of evaluation and management by telemedicine and the availability of in person appointments. In light of current covid-19 pandemic, patient also understands that we are trying to protect them by minimizing in office contact if at all possible.  The patient expressed consent for telemedicine visit and agreed to proceed. Patient understands insurance will be billed.   Past Medical History-  Patient Active Problem List   Diagnosis Date Noted  . Grade II diastolic dysfunction 46/96/2952    Priority: High  . Former smoker 02/19/2014    Priority: High  . Disequilibrium 02/19/2014    Priority: High  . Hyperlipidemia 03/05/2014    Priority: Medium  . Chronic low back pain 12/13/2012    Priority: Medium  . Essential hypertension, benign 09/03/2012    Priority: Medium  . Ataxia 04/01/2011    Priority: Medium  . Osteoporosis  03/21/2010    Priority: Medium  . MURMUR 03/21/2010    Priority: Medium  . Hyperglycemia 03/21/2010    Priority: Medium  . History of vertebral compression fracture 01/04/2013    Priority: Low  . Sinusitis, chronic 09/03/2012    Priority: Low  . Anxiety 04/01/2011    Priority: Low  . Blurred vision 03/21/2010    Priority: Low    Medications- reviewed and updated Current Outpatient Medications  Medication Sig Dispense Refill  . acetaminophen (TYLENOL) 500 MG tablet Take 500 mg by mouth 2 (two) times daily as needed for mild pain. Reported on 06/04/2015    . alendronate (FOSAMAX) 70 MG tablet Take with a full glass of water on an empty stomach. 13 tablet 3  . amLODipine (NORVASC) 2.5 MG tablet TAKE 1 TABLET BY MOUTH EVERY DAY 90 tablet 1  . aspirin 81 MG tablet Take 81 mg by mouth daily. Twice a week    . atorvastatin (LIPITOR) 20 MG tablet Take 1 tablet (20 mg total) by mouth daily. 90 tablet 3  . cyclobenzaprine (FLEXERIL) 5 MG tablet TAKE 1 TABLET BY MOUTH TWICE A DAY AS NEEDED FOR MUSCLE SPASMS 30 tablet 1  . furosemide (LASIX) 40 MG tablet TAKE 1 TABLET BY MOUTH EVERY DAY 90 tablet 1  . metoprolol tartrate (LOPRESSOR) 50 MG tablet TAKE 1 TABLET BY MOUTH TWICE A DAY 180 tablet 1  . traMADol (ULTRAM) 50 MG tablet Take 0.5 tablets (25 mg total) by mouth 2 (two) times daily as needed. for pain 30 tablet 1   No current facility-administered medications for  this visit.     Objective:  BP 115/69   Pulse 69   Ht 5' (1.524 m)   Wt 107 lb (48.5 kg)   BMI 20.90 kg/m  self reported vitals  Nonlabored voice, normal speech      Assessment and Plan   #hypertension S: medication:  Lasix 40mg , Metoprolol 50mg  twice daily, Amlodipine 2.5 mg  Home readings #s: typically in the 120's to 130's.  A/P:  Stable. Continue current medications.    #hyperlipidemia S: Medication: atorvastatin 20mg  daily  Lab Results  Component Value Date   CHOL 152 06/30/2017   HDL 57.70 06/30/2017    LDLCALC 66 06/30/2017   LDLDIRECT 189.3 03/05/2014   TRIG 144.0 06/30/2017   CHOLHDL 3 06/30/2017   A/P: Hopefully stable-patient agrees to come by to update lipid panel when her husband has a visit later this month  # Osteoporosis S:medication: fosamax 70mg  on Sunday morning. Takes calcium/vitamin D.   Last bone density: 02/04/2016. A/P: Hopefully stable-recommended updated bone density at this time-she wants to hold off until next visit in person-hopefully we can see each other together in 6 months  # disequilibrium/ataxia S: ongoing issues at least to 2012. CT brain 10/13/10.  Has improved slightly over time and some days with no issues   A/P: Stable recently-continue to monitor  # Chronic low back pain S: kyphoplasty dec 2014.  Takes two tylenol at 1 PM and 9 30 pm. Also uses very sparing tramadol half tablet.  A/P: Back pain is largely stable-she has run out of tramadol and we will refill this for very sparing use-her last refill was number 60 tablets several years ago-due to sparing use we changed to number 30 tablets with 1 refill with hopes for this to last at least 6 months.  #former smoker- quit Jul 09 2017. Declines lung cancer screening program.  Program -We discussed the importance of the COVID-19 vaccination particularly with her smoking history and her husband's health.  She still has some concerns at this time and wants to hold off-she knows I am available as a resource if she has any further questions.  I would strongly advocate for her to get the vaccination as I think it would be good for her and her husband's health.  #Hyperglycemia-patient has a history of slightly elevated blood sugars but her A1c's have been okay in the past-has been about 2 years since we have checked/we will recheck that with blood work   Recommended follow up: Return in about 6 months (around 01/03/2020). would love for this to be in person if possible- we have not seen each other in person lately due  to covid  Lab/Order associations:   ICD-10-CM   1. Essential hypertension, benign  I10   2. Hyperlipidemia, unspecified hyperlipidemia type  E78.5 CBC with Differential/Platelet    Comprehensive metabolic panel    Lipid panel  3. Hyperglycemia  R73.9 Hemoglobin A1c  4. Age-related osteoporosis without current pathological fracture  M81.0 VITAMIN D 25 Hydroxy (Vit-D Deficiency, Fractures)    Meds ordered this encounter  Medications  . atorvastatin (LIPITOR) 20 MG tablet    Sig: Take 1 tablet (20 mg total) by mouth daily.    Dispense:  90 tablet    Refill:  3  . alendronate (FOSAMAX) 70 MG tablet    Sig: Take with a full glass of water on an empty stomach.    Dispense:  13 tablet    Refill:  3  . traMADol (ULTRAM) 50  MG tablet    Sig: Take 0.5 tablets (25 mg total) by mouth 2 (two) times daily as needed. for pain    Dispense:  30 tablet    Refill:  1    Not to exceed 3 additional fills before 11/04/2016. Please schedule an appointment prior to further refills   Return precautions advised.  Tana Conch, MD

## 2019-07-18 ENCOUNTER — Other Ambulatory Visit (INDEPENDENT_AMBULATORY_CARE_PROVIDER_SITE_OTHER): Payer: Medicare Other

## 2019-07-18 ENCOUNTER — Other Ambulatory Visit: Payer: Self-pay

## 2019-07-18 DIAGNOSIS — E785 Hyperlipidemia, unspecified: Secondary | ICD-10-CM | POA: Diagnosis not present

## 2019-07-18 DIAGNOSIS — M810 Age-related osteoporosis without current pathological fracture: Secondary | ICD-10-CM

## 2019-07-18 DIAGNOSIS — R739 Hyperglycemia, unspecified: Secondary | ICD-10-CM

## 2019-07-18 LAB — COMPREHENSIVE METABOLIC PANEL
ALT: 10 U/L (ref 0–35)
AST: 17 U/L (ref 0–37)
Albumin: 4.5 g/dL (ref 3.5–5.2)
Alkaline Phosphatase: 63 U/L (ref 39–117)
BUN: 18 mg/dL (ref 6–23)
CO2: 30 mEq/L (ref 19–32)
Calcium: 9.8 mg/dL (ref 8.4–10.5)
Chloride: 100 mEq/L (ref 96–112)
Creatinine, Ser: 1.11 mg/dL (ref 0.40–1.20)
GFR: 48.32 mL/min — ABNORMAL LOW (ref >60.00)
Glucose, Bld: 111 mg/dL — ABNORMAL HIGH (ref 70–99)
Potassium: 4.7 mEq/L (ref 3.5–5.1)
Sodium: 135 mEq/L (ref 135–145)
Total Bilirubin: 0.4 mg/dL (ref 0.2–1.2)
Total Protein: 7.5 g/dL (ref 6.0–8.3)

## 2019-07-18 LAB — CBC WITH DIFFERENTIAL/PLATELET
Basophils Absolute: 0.1 10*3/uL (ref 0.0–0.1)
Basophils Relative: 1 % (ref 0.0–3.0)
Eosinophils Absolute: 0.5 10*3/uL (ref 0.0–0.7)
Eosinophils Relative: 4.7 % (ref 0.0–5.0)
HCT: 37.5 % (ref 36.0–46.0)
Hemoglobin: 12.3 g/dL (ref 12.0–15.0)
Lymphocytes Relative: 32 % (ref 12.0–46.0)
Lymphs Abs: 3.2 10*3/uL (ref 0.7–4.0)
MCHC: 32.9 g/dL (ref 30.0–36.0)
MCV: 99.3 fl (ref 78.0–100.0)
Monocytes Absolute: 1 10*3/uL (ref 0.1–1.0)
Monocytes Relative: 10 % (ref 3.0–12.0)
Neutro Abs: 5.2 10*3/uL (ref 1.4–7.7)
Neutrophils Relative %: 52.3 % (ref 43.0–77.0)
Platelets: 355 10*3/uL (ref 150.0–400.0)
RBC: 3.77 Mil/uL — ABNORMAL LOW (ref 3.87–5.11)
RDW: 12.7 % (ref 11.5–15.5)
WBC: 10 10*3/uL (ref 4.0–10.5)

## 2019-07-18 LAB — LIPID PANEL
Cholesterol: 154 mg/dL (ref 0–200)
HDL: 53.4 mg/dL (ref >39.00)
LDL Cholesterol: 77 mg/dL (ref 0–99)
NonHDL: 101.04
Total CHOL/HDL Ratio: 3
Triglycerides: 121 mg/dL (ref 0.0–149.0)
VLDL: 24.2 mg/dL (ref 0.0–40.0)

## 2019-07-18 LAB — HEMOGLOBIN A1C: Hgb A1c MFr Bld: 5.7 % (ref 4.6–6.5)

## 2019-07-19 LAB — VITAMIN D 25 HYDROXY (VIT D DEFICIENCY, FRACTURES): VITD: 40.21 ng/mL (ref 30.00–100.00)

## 2019-11-23 ENCOUNTER — Other Ambulatory Visit: Payer: Self-pay | Admitting: Family Medicine

## 2019-12-08 ENCOUNTER — Other Ambulatory Visit: Payer: Self-pay | Admitting: Family Medicine

## 2019-12-12 ENCOUNTER — Telehealth: Payer: Self-pay | Admitting: Family Medicine

## 2019-12-12 NOTE — Telephone Encounter (Signed)
Left message for patient to call back and schedule Medicare Annual Wellness Visit (AWV) either virtually/audio only OR in office. Whatever the patients preference is.  Last AWV 11/11/18; please schedule at anytime with [facility]Nurse Health Advisor at [facility].  This should be a 45 minute visit.   

## 2019-12-20 ENCOUNTER — Other Ambulatory Visit: Payer: Self-pay | Admitting: Family Medicine

## 2019-12-22 ENCOUNTER — Other Ambulatory Visit: Payer: Self-pay

## 2019-12-22 ENCOUNTER — Ambulatory Visit (INDEPENDENT_AMBULATORY_CARE_PROVIDER_SITE_OTHER): Payer: Medicare Other

## 2019-12-22 DIAGNOSIS — Z599 Problem related to housing and economic circumstances, unspecified: Secondary | ICD-10-CM

## 2019-12-22 DIAGNOSIS — Z0000 Encounter for general adult medical examination without abnormal findings: Secondary | ICD-10-CM
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
## 2019-12-22 NOTE — Patient Instructions (Signed)
Sheena [name] , Thank you for taking time to come for your Medicare Wellness Visit. I appreciate your ongoing commitment to your health goals. Please review the following plan we discussed and let me know if I can assist you in the future.   Screening recommendations/referrals: Colonoscopy: Declined and discussed  Mammogram: Done 11/29/17 Bone Density: Done 02/04/16 Recommended yearly ophthalmology/optometry visit for glaucoma screening and checkup Recommended yearly dental visit for hygiene and checkup  Vaccinations: Influenza vaccine: Declined and discussed  Pneumococcal vaccine: Declined and discussed  Tdap vaccine: Declined and discussed Shingles vaccine: Shingrix discussed. Please contact your pharmacy for coverage information.    Covid-19:1st Dose stated 11/21/19  Advanced directives: Advance directive discussed with you today. I have provided a copy for you to complete at home and have notarized. Once this is complete please bring a copy in to our office so we can scan it into your chart.  Conditions/risks identified: Get away from too many milkshakes  Next appointment: Follow up in one year for your annual wellness visit    Preventive Care 65 Years and Older, Female Preventive care refers to lifestyle choices and visits with your health care provider that can promote health and wellness. What does preventive care include?  A yearly physical exam. This is also called an annual well check.  Dental exams once or twice a year.  Routine eye exams. Ask your health care provider how often you should have your eyes checked.  Personal lifestyle choices, including:  Daily care of your teeth and gums.  Regular physical activity.  Eating a healthy diet.  Avoiding tobacco and drug use.  Limiting alcohol use.  Practicing safe sex.  Taking low-dose aspirin every day.  Taking vitamin and mineral supplements as recommended by your health care provider. What happens during an  annual well check? The services and screenings done by your health care provider during your annual well check will depend on your age, overall health, lifestyle risk factors, and family history of disease. Counseling  Your health care provider may ask you questions about your:  Alcohol use.  Tobacco use.  Drug use.  Emotional well-being.  Home and relationship well-being.  Sexual activity.  Eating habits.  History of falls.  Memory and ability to understand (cognition).  Work and work Astronomer.  Reproductive health. Screening  You may have the following tests or measurements:  Height, weight, and BMI.  Blood pressure.  Lipid and cholesterol levels. These may be checked every 5 years, or more frequently if you are over 70 years old.  Skin check.  Lung cancer screening. You may have this screening every year starting at age 69 if you have a 30-pack-year history of smoking and currently smoke or have quit within the past 15 years.  Fecal occult blood test (FOBT) of the stool. You may have this test every year starting at age 3.  Flexible sigmoidoscopy or colonoscopy. You may have a sigmoidoscopy every 5 years or a colonoscopy every 10 years starting at age 42.  Hepatitis C blood test.  Hepatitis B blood test.  Sexually transmitted disease (STD) testing.  Diabetes screening. This is done by checking your blood sugar (glucose) after you have not eaten for a while (fasting). You may have this done every 1-3 years.  Bone density scan. This is done to screen for osteoporosis. You may have this done starting at age 48.  Mammogram. This may be done every 1-2 years. Talk to your health care provider about how often  you should have regular mammograms. Talk with your health care provider about your test results, treatment options, and if necessary, the need for more tests. Vaccines  Your health care provider may recommend certain vaccines, such as:  Influenza  vaccine. This is recommended every year.  Tetanus, diphtheria, and acellular pertussis (Tdap, Td) vaccine. You may need a Td booster every 10 years.  Zoster vaccine. You may need this after age 5.  Pneumococcal 13-valent conjugate (PCV13) vaccine. One dose is recommended after age 32.  Pneumococcal polysaccharide (PPSV23) vaccine. One dose is recommended after age 19. Talk to your health care provider about which screenings and vaccines you need and how often you need them. This information is not intended to replace advice given to you by your health care provider. Make sure you discuss any questions you have with your health care provider. Document Released: 03/15/2015 Document Revised: 11/06/2015 Document Reviewed: 12/18/2014 Elsevier Interactive Patient Education  2017 [place] Prevention in the Home Falls can cause injuries. They can happen to people of all ages. There are many things you can do to make your home safe and to help prevent falls. What can I do on the outside of my home?  Regularly fix the edges of walkways and driveways and fix any cracks.  Remove anything that might make you trip as you walk through a door, such as a raised step or threshold.  Trim any bushes or trees on the path to your home.  Use bright outdoor lighting.  Clear any walking paths of anything that might make someone trip, such as rocks or tools.  Regularly check to see if handrails are loose or broken. Make sure that both sides of any steps have handrails.  Any raised decks and porches should have guardrails on the edges.  Have any leaves, snow, or ice cleared regularly.  Use sand or salt on walking paths during winter.  Clean up any spills in your garage right away. This includes oil or grease spills. What can I do in the bathroom?  Use night lights.  Install grab bars by the toilet and in the tub and shower. Do not use towel bars as grab bars.  Use non-skid mats or decals  in the tub or shower.  If you need to sit down in the shower, use a plastic, non-slip stool.  Keep the floor dry. Clean up any water that spills on the floor as soon as it happens.  Remove soap buildup in the tub or shower regularly.  Attach bath mats securely with double-sided non-slip rug tape.  Do not have throw rugs and other things on the floor that can make you trip. What can I do in the bedroom?  Use night lights.  Make sure that you have a light by your bed that is easy to reach.  Do not use any sheets or blankets that are too big for your bed. They should not hang down onto the floor.  Have a firm chair that has side arms. You can use this for support while you get dressed.  Do not have throw rugs and other things on the floor that can make you trip. What can I do in the kitchen?  Clean up any spills right away.  Avoid walking on wet floors.  Keep items that you use a lot in easy-to-reach places.  If you need to reach something above you, use a strong step stool that has a grab bar.  Keep electrical cords  out of the way.  Do not use floor polish or wax that makes floors slippery. If you must use wax, use non-skid floor wax.  Do not have throw rugs and other things on the floor that can make you trip. What can I do with my stairs?  Do not leave any items on the stairs.  Make sure that there are handrails on both sides of the stairs and use them. Fix handrails that are broken or loose. Make sure that handrails are as long as the stairways.  Check any carpeting to make sure that it is firmly attached to the stairs. Fix any carpet that is loose or worn.  Avoid having throw rugs at the top or bottom of the stairs. If you do have throw rugs, attach them to the floor with carpet tape.  Make sure that you have a light switch at the top of the stairs and the bottom of the stairs. If you do not have them, ask someone to add them for you. What else can I do to help  prevent falls?  Wear shoes that:  Do not have high heels.  Have rubber bottoms.  Are comfortable and fit you well.  Are closed at the toe. Do not wear sandals.  If you use a stepladder:  Make sure that it is fully opened. Do not climb a closed stepladder.  Make sure that both sides of the stepladder are locked into place.  Ask someone to hold it for you, if possible.  Clearly mark and make sure that you can see:  Any grab bars or handrails.  First and last steps.  Where the edge of each step is.  Use tools that help you move around (mobility aids) if they are needed. These include:  Canes.  Walkers.  Scooters.  Crutches.  Turn on the lights when you go into a dark area. Replace any light bulbs as soon as they burn out.  Set up your furniture so you have a clear path. Avoid moving your furniture around.  If any of your floors are uneven, fix them.  If there are any pets around you, be aware of where they are.  Review your medicines with your doctor. Some medicines can make you feel dizzy. This can increase your chance of falling. Ask your doctor what other things that you can do to help prevent falls. This information is not intended to replace advice given to you by your health care provider. Make sure you discuss any questions you have with your health care provider. Document Released: 12/13/2008 Document Revised: 07/25/2015 Document Reviewed: 03/23/2014 Elsevier Interactive Patient Education  2017 [employer].

## 2019-12-26 ENCOUNTER — Telehealth: Payer: Self-pay

## 2019-12-26 NOTE — Telephone Encounter (Signed)
    MA10/26/2021 1st Attempt  Name: Sheena [name]   MRN: [id number]   DOB: 09/11/1945   AGE: 74 y.o.   GENDER: female   PCP [name], MD.   12/26/19 Spoke with Ms. Palomo about calling her insurance to see what they will cover and what dental provider will take her insurance.  I will follow-up with Ms. Scafidi in the next 7days.    Perl Folmar, AAS Paralegal, [facility] Care Guide . Embedded Care Coordination [facility] Health  Care Management  [address] [place], [place] [address] [email or web address]  [id number]   [email or web address]

## 2020-01-01 ENCOUNTER — Ambulatory Visit: Payer: Medicare Other | Admitting: Family Medicine

## 2020-01-01 ENCOUNTER — Telehealth: Payer: Self-pay

## 2020-01-01 NOTE — Telephone Encounter (Signed)
    MA11/03/2019   Name: Sheena [name]   MRN: [id number]   DOB: May 12, 1945   AGE: 74 y.o.   GENDER: female   PCP [name], MD.   01/01/20 Spoke with patient Medicare will not cover dental but they have not called Medicaid yet. They plan on calling by the end of the week. I will follow-up in the next 7 days.    Dung Salinger, AAS Paralegal, [facility] Care Guide . Embedded Care Coordination [facility] Health  Care Management  [address] [place], [place] [address] [email or web address]  [id number]   [email or web address]

## 2020-01-04 ENCOUNTER — Telehealth: Payer: Self-pay

## 2020-01-04 NOTE — Telephone Encounter (Signed)
    MA11/06/2019  Name: Sheena [name]   MRN: [id number]   DOB: 1945/09/06   AGE: 74 y.o.   GENDER: female   PCP Sheena Majestic, MD.   01/04/20 Spoke with patient she  was unable to talk and requested that I call her back tomorrow afternoon.    Sheena [name], AAS Paralegal, [facility] Care Guide . Embedded Care Coordination [facility] Health  Care Management  [address] [place], [place] [address] [name]  [phone]  [email or web address]

## 2020-01-05 ENCOUNTER — Telehealth: Payer: Self-pay

## 2020-01-05 NOTE — Telephone Encounter (Signed)
    MA11/07/2019  Name: Sheena [name]   MRN: [id number]   DOB: 02/09/1946   AGE: 74 y.o.   GENDER: female   PCP [name], MD.   01/05/20 Spoke with patient Medicaid will not cover the procedures that the patient had only basic procedures.  Medicaid gave her  a list of dentist that will accept her coverage.  Patient plans on checking with the [facility] clinic. No additional resources needed at this time. Closing referral.    Mat Stuard, AAS Paralegal, [facility] Care Guide . Embedded Care Coordination [facility]  Care Management  [address] [place], [place] [address] [email or web address]  [phone]   [email or web address]

## 2020-01-17 NOTE — Patient Instructions (Addendum)
Health Maintenance Due  Topic Date Due   MAMMOGRAM Has not been scheduled yet . Wants to wait for next visit as well as for dexa 11/30/2019   Please stop by lab before you go If you have mychart- we will send your results within 3 business days of [place] receiving them.  If you do not have mychart- we will call you about results within 5 business days of [place] receiving them.  *please note we are currently using [facility] which has a longer processing time than [place] typically so labs may not come back as quickly as in the past *please also note that you will see labs on mychart as soon as they post. I will later go in and write notes on them- will say "notes from Dr. Durene Cal"

## 2020-01-17 NOTE — Progress Notes (Addendum)
Phone [phone] In person visit   Subjective:   Sheena [name] is a 74 y.o. year old very pleasant female patient who presents for/with See problem oriented charting Chief Complaint  Patient presents with  . Hyperlipidemia  . Hypertension   This visit occurred during the SARS-CoV-2 public health emergency.  Safety protocols were in place, including screening questions prior to the visit, additional usage of staff PPE, and extensive cleaning of exam room while observing appropriate contact time as indicated for disinfecting solutions.   Past Medical History-  Patient Active Problem List   Diagnosis Date Noted  . Grade II diastolic dysfunction 03/06/2015    Priority: High  . Former smoker 02/19/2014    Priority: High  . Disequilibrium 02/19/2014    Priority: High  . Hyperlipidemia 03/05/2014    Priority: Medium  . Chronic low back pain 12/13/2012    Priority: Medium  . Essential hypertension, benign 09/03/2012    Priority: Medium  . Ataxia 04/01/2011    Priority: Medium  . Osteoporosis 03/21/2010    Priority: Medium  . MURMUR 03/21/2010    Priority: Medium  . Hyperglycemia 03/21/2010    Priority: Medium  . History of vertebral compression fracture 01/04/2013    Priority: Low  . Sinusitis, chronic 09/03/2012    Priority: Low  . Anxiety 04/01/2011    Priority: Low  . Blurred vision 03/21/2010    Priority: Low    Medications- reviewed and updated Current Outpatient Medications  Medication Sig Dispense Refill  . acetaminophen (TYLENOL) 500 MG tablet Take 500 mg by mouth 2 (two) times daily as needed for mild pain. Reported on 06/04/2015    . alendronate (FOSAMAX) 70 MG tablet Take with a full glass of water on an empty stomach. 13 tablet 3  . amLODipine (NORVASC) 2.5 MG tablet TAKE 1 TABLET BY MOUTH EVERY DAY 90 tablet 1  . atorvastatin (LIPITOR) 20 MG tablet Take 1 tablet (20 mg total) by mouth daily. 90 tablet 3  . cyclobenzaprine (FLEXERIL) 5 MG tablet TAKE 1  TABLET BY MOUTH TWICE A DAY AS NEEDED FOR MUSCLE SPASMS 30 tablet 1  . furosemide (LASIX) 40 MG tablet TAKE 1 TABLET BY MOUTH EVERY DAY 90 tablet 1  . metoprolol tartrate (LOPRESSOR) 50 MG tablet TAKE 1 TABLET BY MOUTH TWICE A DAY 180 tablet 1  . traMADol (ULTRAM) 50 MG tablet Take 0.5 tablets (25 mg total) by mouth 2 (two) times daily as needed. for pain 30 tablet 1   No current facility-administered medications for this visit.     Objective:  BP 120/80   Pulse 72   Temp 98 F (36.7 C) (Temporal)   Resp 18   Ht 5' (1.524 m)   Wt 117 lb (53.1 kg)   SpO2 99%   BMI 22.85 kg/m  Gen: NAD, resting comfortably CV: RRR no murmurs rubs or gallops Lungs: CTAB no crackles, wheeze, rhonchi Abdomen: soft/nontender/nondistended/normal bowel sounds.  Ext: no edema Skin: warm, dry Neuro: grossly normal, moves all extremities     Assessment and Plan   #hypertension S: medication:  Lasix 40mg , Metoprolol 50mg  twice daily, Amlodipine 2.5 mg  Home readings #s: typically in the 120's to 130's. Very rare 140s.  A/P: Stable. Continue current medications.   #hyperlipidemia/aortic atherosclerosis S: Medication: atorvastatin 20mg  daily , aspirin 81 mg twice a week Lab Results  Component Value Date   CHOL 154 07/18/2019   HDL 53.40 07/18/2019   LDLCALC 77 07/18/2019   LDLDIRECT 189.3 03/05/2014  TRIG 121.0 07/18/2019   CHOLHDL 3 07/18/2019  A/P: reasonable control considering this is for primary prevention- continue current medicine.  - with atherosclerosis- opted to continue aspirin  # Osteoporosis S:medication: fosamax 70mg  on Sunday morning. Takes calcium/vitamin D.   Last bone density: 02/04/2016. A/P: discussed updating bone density but she would like to hold off for now. Will do this in the spring (she is concerned about covid)    #former smoker- quit Jul 09 2017. Declines lung cancer screening program once again today. Declines UA as well.   # disequilibrium/ataxia S: ongoing  issues at least to 2012. CT brain 10/13/10.  Has improved slightly over time and some days with no issues. Better with warmer weather  A/P: stable recently- continue to monitor.    # Chronic low back pain S: kyphoplasty dec 2014.  Takes two tylenol at 1 PM and 9 30 pm. Also uses very sparing tramadol half tablet.  A/P: reasonable control- continue current meds   #Hyperglycemia-patient has a history of slightly elevated blood sugars and a1cs have been slightly elevated. Continue to trend- update a1c with labs  Lab Results  Component Value Date   HGBA1C 5.7 07/18/2019   HGBA1C 5.6 06/30/2017   HGBA1C 5.8 06/04/2015   # still declines flu shot, Tdap. Thankfully got covid 19 vaccine!   Recommended follow up: Return in about 6 months (around 07/17/2020) for follow up- or sooner if needed. Future Appointments  Date Time Provider Department Center  12/27/2020  1:45 PM [facility]    Lab/Order associations:   ICD-10-CM   1. Essential hypertension, benign  I10   2. Hyperlipidemia, unspecified hyperlipidemia type  E78.5 CBC With Differential/Platelet    COMPLETE METABOLIC PANEL WITH GFR  3. Hyperglycemia  R73.9 Hemoglobin A1c  4. Age-related osteoporosis without current pathological fracture  M81.0   5. Chronic bilateral low back pain without sciatica  M54.50    G89.29   6. Ataxia  R27.0     Meds ordered this encounter  Medications  . traMADol (ULTRAM) 50 MG tablet    Sig: Take 0.5 tablets (25 mg total) by mouth 2 (two) times daily as needed. for pain    Dispense:  30 tablet    Refill:  1    Not to exceed 3 additional fills before 11/04/2016. Please schedule an appointment prior to further refills   Return precautions advised.  01/04/2017, MD

## 2020-01-18 ENCOUNTER — Ambulatory Visit (INDEPENDENT_AMBULATORY_CARE_PROVIDER_SITE_OTHER): Payer: Medicare Other | Admitting: Family Medicine

## 2020-01-18 ENCOUNTER — Other Ambulatory Visit: Payer: Self-pay

## 2020-01-18 ENCOUNTER — Encounter: Payer: Self-pay | Admitting: Family Medicine

## 2020-01-18 VITALS — BP 120/80 | HR 72 | Temp 98.0°F | Resp 18 | Ht 60.0 in | Wt 117.0 lb

## 2020-01-18 DIAGNOSIS — Z1231 Encounter for screening mammogram for malignant neoplasm of breast: Secondary | ICD-10-CM

## 2020-01-18 DIAGNOSIS — R270 Ataxia, unspecified: Secondary | ICD-10-CM | POA: Diagnosis not present

## 2020-01-18 DIAGNOSIS — M810 Age-related osteoporosis without current pathological fracture: Secondary | ICD-10-CM | POA: Diagnosis not present

## 2020-01-18 DIAGNOSIS — I700 Atherosclerosis of aorta: Secondary | ICD-10-CM

## 2020-01-18 DIAGNOSIS — R739 Hyperglycemia, unspecified: Secondary | ICD-10-CM | POA: Diagnosis not present

## 2020-01-18 DIAGNOSIS — G8929 Other chronic pain: Secondary | ICD-10-CM | POA: Diagnosis not present

## 2020-01-18 DIAGNOSIS — E785 Hyperlipidemia, unspecified: Secondary | ICD-10-CM

## 2020-01-18 DIAGNOSIS — F419 Anxiety disorder, unspecified: Secondary | ICD-10-CM

## 2020-01-18 DIAGNOSIS — I10 Essential (primary) hypertension: Secondary | ICD-10-CM

## 2020-01-18 DIAGNOSIS — M545 Low back pain, unspecified: Secondary | ICD-10-CM | POA: Diagnosis not present

## 2020-01-18 MED ORDER — TRAMADOL HCL 50 MG PO TABS: 25.0000 mg | ORAL_TABLET | Freq: Two times a day (BID) | ORAL | 1 refills | Status: DC | PRN

## 2020-01-19 LAB — HEMOGLOBIN A1C
Hgb A1c MFr Bld: 5.6 % of total Hgb (ref <5.7)
Mean Plasma Glucose: 114 (calc)
eAG (mmol/L): 6.3 (calc)

## 2020-01-19 LAB — CBC WITH DIFFERENTIAL/PLATELET
Absolute Monocytes: 969 cells/uL — ABNORMAL HIGH (ref 200–950)
Basophils Absolute: 112 cells/uL (ref 0–200)
Basophils Relative: 1.1 %
Eosinophils Absolute: 459 cells/uL (ref 15–500)
Eosinophils Relative: 4.5 %
HCT: 35.7 % (ref 35.0–45.0)
Hemoglobin: 11.9 g/dL (ref 11.7–15.5)
Lymphs Abs: 3213 cells/uL (ref 850–3900)
MCH: 31.9 pg (ref 27.0–33.0)
MCHC: 33.3 g/dL (ref 32.0–36.0)
MCV: 95.7 fL (ref 80.0–100.0)
MPV: 10.3 fL (ref 7.5–12.5)
Monocytes Relative: 9.5 %
Neutro Abs: 5447 cells/uL (ref 1500–7800)
Neutrophils Relative %: 53.4 %
Platelets: 370 10*3/uL (ref 140–400)
RBC: 3.73 10*6/uL — ABNORMAL LOW (ref 3.80–5.10)
RDW: 11.3 % (ref 11.0–15.0)
Total Lymphocyte: 31.5 %
WBC: 10.2 10*3/uL (ref 3.8–10.8)

## 2020-01-19 LAB — COMPLETE METABOLIC PANEL WITH GFR
AG Ratio: 1.4 (calc) (ref 1.0–2.5)
ALT: 14 U/L (ref 6–29)
AST: 23 U/L (ref 10–35)
Albumin: 4.5 g/dL (ref 3.6–5.1)
Alkaline phosphatase (APISO): 59 U/L (ref 37–153)
BUN/Creatinine Ratio: 12 (calc) (ref 6–22)
BUN: 18 mg/dL (ref 7–25)
CO2: 27 mmol/L (ref 20–32)
Calcium: 10.5 mg/dL — ABNORMAL HIGH (ref 8.6–10.4)
Chloride: 98 mmol/L (ref 98–110)
Creat: 1.47 mg/dL — ABNORMAL HIGH (ref 0.60–0.93)
GFR, Est African American: 41 mL/min/{1.73_m2} — ABNORMAL LOW (ref >=60)
GFR, Est Non African American: 35 mL/min/{1.73_m2} — ABNORMAL LOW (ref >=60)
Globulin: 3.3 g/dL (calc) (ref 1.9–3.7)
Glucose, Bld: 111 mg/dL — ABNORMAL HIGH (ref 65–99)
Potassium: 5 mmol/L (ref 3.5–5.3)
Sodium: 137 mmol/L (ref 135–146)
Total Bilirubin: 0.3 mg/dL (ref 0.2–1.2)
Total Protein: 7.8 g/dL (ref 6.1–8.1)

## 2020-02-19 ENCOUNTER — Other Ambulatory Visit: Payer: Self-pay

## 2020-02-19 ENCOUNTER — Other Ambulatory Visit: Payer: Medicare Other

## 2020-02-19 DIAGNOSIS — R7989 Other specified abnormal findings of blood chemistry: Secondary | ICD-10-CM | POA: Diagnosis not present

## 2020-02-20 LAB — BASIC METABOLIC PANEL
BUN/Creatinine Ratio: 14 (calc) (ref 6–22)
BUN: 16 mg/dL (ref 7–25)
CO2: 28 mmol/L (ref 20–32)
Calcium: 10.2 mg/dL (ref 8.6–10.4)
Chloride: 101 mmol/L (ref 98–110)
Creat: 1.18 mg/dL — ABNORMAL HIGH (ref 0.60–0.93)
Glucose, Bld: 112 mg/dL — ABNORMAL HIGH (ref 65–99)
Potassium: 5.1 mmol/L (ref 3.5–5.3)
Sodium: 140 mmol/L (ref 135–146)

## 2020-05-19 ENCOUNTER — Other Ambulatory Visit: Payer: Self-pay | Admitting: Family Medicine

## 2020-06-06 ENCOUNTER — Other Ambulatory Visit: Payer: Self-pay | Admitting: Family Medicine

## 2020-06-20 ENCOUNTER — Other Ambulatory Visit: Payer: Self-pay | Admitting: Family Medicine

## 2020-07-01 ENCOUNTER — Ambulatory Visit: Payer: Medicare Other | Admitting: Family Medicine

## 2020-08-10 ENCOUNTER — Other Ambulatory Visit: Payer: Self-pay | Admitting: Family Medicine

## 2020-08-27 ENCOUNTER — Telehealth (INDEPENDENT_AMBULATORY_CARE_PROVIDER_SITE_OTHER): Payer: Medicare Other | Admitting: Family Medicine

## 2020-08-27 ENCOUNTER — Encounter: Payer: Self-pay | Admitting: Family Medicine

## 2020-08-27 VITALS — BP 143/78 | HR 78 | Ht 60.0 in | Wt 120.0 lb

## 2020-08-27 DIAGNOSIS — E785 Hyperlipidemia, unspecified: Secondary | ICD-10-CM | POA: Diagnosis not present

## 2020-08-27 DIAGNOSIS — R42 Dizziness and giddiness: Secondary | ICD-10-CM

## 2020-08-27 DIAGNOSIS — I10 Essential (primary) hypertension: Secondary | ICD-10-CM

## 2020-08-27 DIAGNOSIS — M810 Age-related osteoporosis without current pathological fracture: Secondary | ICD-10-CM | POA: Diagnosis not present

## 2020-08-27 NOTE — Patient Instructions (Addendum)
Health Maintenance Due  Topic Date Due   Zoster Vaccines- Shingrix (1 of 2) Will discuss at the next in office visit.  Never done   MAMMOGRAM Patient has this scheduled for  09/12/2020  11/30/2019    Recommended follow up: Return in about 6 months (around 02/26/2021) for follow up- or sooner if needed.

## 2020-08-27 NOTE — Progress Notes (Signed)
Phone [phone] Virtual visit via phonenote   Subjective:   Chief Complaint  Patient presents with   Hyperlipidemia   Hypertension   This visit type was conducted due to national recommendations for restrictions regarding the COVID-19 Pandemic (e.g. social distancing).  This format is felt to be most appropriate for this patient at this time balancing risks to patient and risks to population by having him in for in person visit.  All issues noted in this document were discussed and addressed.  No physical exam was performed (except for noted visual exam or audio findings with Telehealth visits).  The patient has consented to conduct a Telehealth visit and understands insurance will be billed.   Our team/I connected with Sheena [name] at  1:40 PM EDT by phone (patient did not have equipment for webex) and verified that I am speaking with the correct person using two identifiers.  Location patient: [facility] Location provider: [facility], office Persons participating in the virtual visit:  patient  Time on phone: 21 minutes Counseling provided about bp goals  Our team/I discussed the limitations of evaluation and management by telemedicine and the availability of in person appointments. In light of current covid-19 pandemic, patient also understands that we are trying to protect them by minimizing in office contact if at all possible.  The patient expressed consent for telemedicine visit and agreed to proceed. Patient understands insurance will be billed.   Past Medical History-  Patient Active Problem List   Diagnosis Date Noted   Grade II diastolic dysfunction 03/06/2015    Priority: High   Former smoker 02/19/2014    Priority: High   Disequilibrium 02/19/2014    Priority: High   Aortic atherosclerosis (HCC) 01/18/2020    Priority: Medium   Hyperlipidemia 03/05/2014    Priority: Medium   Chronic low back pain 12/13/2012    Priority: Medium   Essential hypertension, benign  09/03/2012    Priority: Medium   Ataxia 04/01/2011    Priority: Medium   Osteoporosis 03/21/2010    Priority: Medium   MURMUR 03/21/2010    Priority: Medium   Hyperglycemia 03/21/2010    Priority: Medium   History of vertebral compression fracture 01/04/2013    Priority: Low   Sinusitis, chronic 09/03/2012    Priority: Low   Anxiety 04/01/2011    Priority: Low   Blurred vision 03/21/2010    Priority: Low    Medications- reviewed and updated Current Outpatient Medications  Medication Sig Dispense Refill   acetaminophen (TYLENOL) 500 MG tablet Take 500 mg by mouth 2 (two) times daily as needed for mild pain. Reported on 06/04/2015     alendronate (FOSAMAX) 70 MG tablet TAKE 1 TABLET BY MOUTH EVERY 7 DAYS. TAKE WITH A FULL GLASS OF WATER ON AN EMPTY STOMACH 13 tablet 3   amLODipine (NORVASC) 2.5 MG tablet TAKE 1 TABLET BY MOUTH EVERY DAY 90 tablet 1   aspirin EC 81 MG tablet Take 81 mg by mouth 2 (two) times a week.     atorvastatin (LIPITOR) 20 MG tablet TAKE 1 TABLET BY MOUTH EVERY DAY 90 tablet 3   cyclobenzaprine (FLEXERIL) 5 MG tablet TAKE 1 TABLET BY MOUTH TWICE A DAY AS NEEDED FOR MUSCLE SPASMS 30 tablet 1   furosemide (LASIX) 40 MG tablet TAKE 1 TABLET BY MOUTH EVERY DAY (Patient taking differently: every other day.) 90 tablet 1   metoprolol tartrate (LOPRESSOR) 50 MG tablet TAKE 1 TABLET BY MOUTH TWICE A DAY 180 tablet 1  traMADol (ULTRAM) 50 MG tablet Take 0.5 tablets (25 mg total) by mouth 2 (two) times daily as needed. for pain 30 tablet 1   No current facility-administered medications for this visit.     Objective:  BP (!) 143/78   Pulse 78   Ht 5' (1.524 m)   Wt 120 lb (54.4 kg)   BMI 23.44 kg/m  self reported vitals  Nonlabored voice, normal speech      Assessment and Plan  #hypertension S: medication: Lasix 40mg  every other day, Metoprolol 50mg  twice daily, Amlodipine 2.5 mg  Home readings #s: typically in the 120's to 130's.at last visit- now 120s to  140s.  BP Readings from Last 3 Encounters:  08/27/20 (!) 143/78  01/18/20 120/80  07/03/19 115/69   A/P: Mild poor control of blood pressure but patient is not in the best position at home with back unsupported-we discussed feet on the ground, resting for 5 minutes, back up against a chair and evaluate over the next 2 weeks and then update 01/20/20 with readings-if above goal less than 135/85 on average consider adjusting medications.  Continue current medication for now- may need to go back to every other day lasix -advised to walk on salt intake  #hyperlipidemia S: Medication: atorvastatin 20mg  daily  Lab Results  Component Value Date   CHOL 154 07/18/2019   HDL 53.40 07/18/2019   LDLCALC 77 07/18/2019   LDLDIRECT 189.3 03/05/2014   TRIG 121.0 07/18/2019   CHOLHDL 3 07/18/2019  A/P: declines lipid panel for now- agrees to in person visit in November and recheck. Hpoefully controlled- continue current meds  # Osteoporosis S:medication: fosamax 70mg  on Sunday morning. Takes calcium/vitamin D.   Last bone density: 02/04/2016. A/P: hopefully stable- declines bone density until next visit. Continue calcium/vitamin D   # disequilibrium/ataxia S: ongoing issues at least to 2012. CT brain 10/13/10. Has improved slightly over time and some days with no issues - intermittent issues- worse with higher heat  A/P: largely stable- continue to monitor   # Chronic low back pain S: kyphoplasty dec 2014. Takes two tylenol at 1 PM and 9 30 pm. Also uses very sparing tramadol half tablet- not recently A/P: doing reasonably well with tylenol- continue to monitor   #Hyperglycemia-patient has a history of slightly elevated blood sugars but herA1c's have been okay in the past. Monitor at least fasting cbg next visit Lab Results  Component Value Date   HGBA1C 5.6 01/18/2020   Recommended follow up:  Future Appointments  Date Time Provider Department Center  12/27/2020  1:45 PM [facility]  [facility]    Lab/Order associations:   ICD-10-CM   1. Essential hypertension, benign  I10     2. Hyperlipidemia, unspecified hyperlipidemia type  E78.5     3. Age-related osteoporosis without current pathological fracture  M81.0     4. Disequilibrium  R42      I,Harris Phan,acting as a scribe for 2013, MD.,have documented all relevant documentation on the behalf of 10/15/10, MD,as directed by  01/20/2020, MD while in the presence of 12/29/2020, MD.  I, Tana Conch, MD, have reviewed all documentation for this visit. The documentation on 08/27/20 for the exam, diagnosis, procedures, and orders are all accurate and complete.  Return precautions advised.  Tana Conch, MD

## 2020-09-12 DIAGNOSIS — Z1231 Encounter for screening mammogram for malignant neoplasm of breast: Secondary | ICD-10-CM | POA: Diagnosis not present

## 2020-09-12 LAB — HM MAMMOGRAPHY

## 2020-09-13 ENCOUNTER — Telehealth: Payer: Self-pay

## 2020-09-13 ENCOUNTER — Encounter: Payer: Self-pay | Admitting: Family Medicine

## 2020-09-13 NOTE — Telephone Encounter (Signed)
Patient called in for BP/Heart rate readings for DR [facility]  7/1: 137/54 (Heart Rate:64) 7/2: 156/62 (Heart Rate:67) 7/3: 148/65 (Heart Rate:71) 7/4: 153/60 (Heart Rate :65) 7/5: 126/52 (Heart Rate:69) 7/6 139/58 (Heart Rate:70) 7/7 148/63 (Heart Rate :68) 7/8: 139/56 (Heart Rate: 64) 7/9: 129/57 (Heart Rate:65) 7/10: 128/59 (Heart Rate: 66) 7/11: 145/75 (Heart Rate:72) 7/13: 154/69 (Heart Rate: 66) 7/14: 146/67 (Heart Rate:69) 7/15: 141/63 (Heart Rate:68)

## 2020-09-13 NOTE — Telephone Encounter (Signed)
Please see message from patient

## 2020-09-13 NOTE — Telephone Encounter (Signed)
Average over last 14 readings avg [phone]   I would recommend increasing amlodipine to 5 mg - team send in new rx if she is willing for daily use #90 with 3 refills. She can take 2 of the 2.5mg  tablets until she runs out. Recommend 1 month in person recheck

## 2020-09-16 MED ORDER — AMLODIPINE BESYLATE 5 MG PO TABS: 5.0000 mg | ORAL_TABLET | Freq: Every day | ORAL | 3 refills | Status: DC

## 2020-09-16 NOTE — Telephone Encounter (Signed)
Called and spoke with pt and she states she will double up on the 2.5mg  and will let us know when she needs a new Rx for the 5mg  of amlodipine.

## 2020-10-20 ENCOUNTER — Other Ambulatory Visit: Payer: Self-pay | Admitting: Family Medicine

## 2020-10-21 ENCOUNTER — Telehealth: Payer: Self-pay

## 2020-10-21 MED ORDER — AMLODIPINE BESYLATE 5 MG PO TABS: 5.0000 mg | ORAL_TABLET | Freq: Every day | ORAL | 3 refills | Status: DC

## 2020-10-21 NOTE — Telephone Encounter (Signed)
LAST APPOINTMENT DATE: 08/27/20  NEXT APPOINTMENT DATE: 02/11/21  MEDICATION:amLODipine (NORVASC) 5 MG tablet  PHARMACY:[id number] [place], [place] - [address]

## 2020-10-21 NOTE — Telephone Encounter (Signed)
Refill has been sent.  °

## 2020-10-22 ENCOUNTER — Other Ambulatory Visit: Payer: Self-pay | Admitting: Family Medicine

## 2020-11-28 DIAGNOSIS — Z23 Encounter for immunization: Secondary | ICD-10-CM | POA: Diagnosis not present

## 2020-12-27 ENCOUNTER — Ambulatory Visit (INDEPENDENT_AMBULATORY_CARE_PROVIDER_SITE_OTHER): Payer: Medicare Other

## 2020-12-27 DIAGNOSIS — Z0000 Encounter for general adult medical examination without abnormal findings: Secondary | ICD-10-CM
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
## 2020-12-27 NOTE — Patient Instructions (Signed)
Sheena [name] , Thank you for taking time to come for your Medicare Wellness Visit. I appreciate your ongoing commitment to your health goals. Please review the following plan we discussed and let me know if I can assist you in the future.   Screening recommendations/referrals: Colonoscopy: Declined  Mammogram: Done 09/12/20 repeat every year Bone Density: Done 02/04/16 repeat every 2 years  Recommended yearly ophthalmology/optometry visit for glaucoma screening and checkup Recommended yearly dental visit for hygiene and checkup  Vaccinations: Influenza vaccine: Declined  Pneumococcal vaccine: declined Tdap vaccine: Due and discussed Shingles vaccine: Shingrix discussed. Please contact your pharmacy for coverage information.    Covid-19:Completed 9/21, 12/23/19 & 6/9, 11/28/20  Advanced directives: Advance directive discussed with you today. Even though you declined this today please call our office should you change your mind and we can give you the proper paperwork for you to fill out.   Conditions/risks identified: Cut out junk food   Next appointment: Follow up in one year for your annual wellness visit    Preventive Care 65 Years and Older, Female Preventive care refers to lifestyle choices and visits with your health care provider that can promote health and wellness. What does preventive care include? A yearly physical exam. This is also called an annual well check. Dental exams once or twice a year. Routine eye exams. Ask your health care provider how often you should have your eyes checked. Personal lifestyle choices, including: Daily care of your teeth and gums. Regular physical activity. Eating a healthy diet. Avoiding tobacco and drug use. Limiting alcohol use. Practicing safe sex. Taking low-dose aspirin every day. Taking vitamin and mineral supplements as recommended by your health care provider. What happens during an annual well check? The services and screenings  done by your health care provider during your annual well check will depend on your age, overall health, lifestyle risk factors, and family history of disease. Counseling  Your health care provider may ask you questions about your: Alcohol use. Tobacco use. Drug use. Emotional well-being. Home and relationship well-being. Sexual activity. Eating habits. History of falls. Memory and ability to understand (cognition). Work and work Astronomer. Reproductive health. Screening  You may have the following tests or measurements: Height, weight, and BMI. Blood pressure. Lipid and cholesterol levels. These may be checked every 5 years, or more frequently if you are over 47 years old. Skin check. Lung cancer screening. You may have this screening every year starting at age 15 if you have a 30-pack-year history of smoking and currently smoke or have quit within the past 15 years. Fecal occult blood test (FOBT) of the stool. You may have this test every year starting at age 16. Flexible sigmoidoscopy or colonoscopy. You may have a sigmoidoscopy every 5 years or a colonoscopy every 10 years starting at age 25. Hepatitis C blood test. Hepatitis B blood test. Sexually transmitted disease (STD) testing. Diabetes screening. This is done by checking your blood sugar (glucose) after you have not eaten for a while (fasting). You may have this done every 1-3 years. Bone density scan. This is done to screen for osteoporosis. You may have this done starting at age 33. Mammogram. This may be done every 1-2 years. Talk to your health care provider about how often you should have regular mammograms. Talk with your health care provider about your test results, treatment options, and if necessary, the need for more tests. Vaccines  Your health care provider may recommend certain vaccines, such as: Influenza vaccine.  This is recommended every year. Tetanus, diphtheria, and acellular pertussis (Tdap, Td)  vaccine. You may need a Td booster every 10 years. Zoster vaccine. You may need this after age 61. Pneumococcal 13-valent conjugate (PCV13) vaccine. One dose is recommended after age 36. Pneumococcal polysaccharide (PPSV23) vaccine. One dose is recommended after age 61. Talk to your health care provider about which screenings and vaccines you need and how often you need them. This information is not intended to replace advice given to you by your health care provider. Make sure you discuss any questions you have with your health care provider. Document Released: 03/15/2015 Document Revised: 11/06/2015 Document Reviewed: 12/18/2014 Elsevier Interactive Patient Education  2017 [employer] Prevention in the Home Falls can cause injuries. They can happen to people of all ages. There are many things you can do to make your home safe and to help prevent falls. What can I do on the outside of my home? Regularly fix the edges of walkways and driveways and fix any cracks. Remove anything that might make you trip as you walk through a door, such as a raised step or threshold. Trim any bushes or trees on the path to your home. Use bright outdoor lighting. Clear any walking paths of anything that might make someone trip, such as rocks or tools. Regularly check to see if handrails are loose or broken. Make sure that both sides of any steps have handrails. Any raised decks and porches should have guardrails on the edges. Have any leaves, snow, or ice cleared regularly. Use sand or salt on walking paths during winter. Clean up any spills in your garage right away. This includes oil or grease spills. What can I do in the bathroom? Use night lights. Install grab bars by the toilet and in the tub and shower. Do not use towel bars as grab bars. Use non-skid mats or decals in the tub or shower. If you need to sit down in the shower, use a plastic, non-slip stool. Keep the floor dry. Clean up any  water that spills on the floor as soon as it happens. Remove soap buildup in the tub or shower regularly. Attach bath mats securely with double-sided non-slip rug tape. Do not have throw rugs and other things on the floor that can make you trip. What can I do in the bedroom? Use night lights. Make sure that you have a light by your bed that is easy to reach. Do not use any sheets or blankets that are too big for your bed. They should not hang down onto the floor. Have a firm chair that has side arms. You can use this for support while you get dressed. Do not have throw rugs and other things on the floor that can make you trip. What can I do in the kitchen? Clean up any spills right away. Avoid walking on wet floors. Keep items that you use a lot in easy-to-reach places. If you need to reach something above you, use a strong step stool that has a grab bar. Keep electrical cords out of the way. Do not use floor polish or wax that makes floors slippery. If you must use wax, use non-skid floor wax. Do not have throw rugs and other things on the floor that can make you trip. What can I do with my stairs? Do not leave any items on the stairs. Make sure that there are handrails on both sides of the stairs and use them. Fix handrails  that are broken or loose. Make sure that handrails are as long as the stairways. Check any carpeting to make sure that it is firmly attached to the stairs. Fix any carpet that is loose or worn. Avoid having throw rugs at the top or bottom of the stairs. If you do have throw rugs, attach them to the floor with carpet tape. Make sure that you have a light switch at the top of the stairs and the bottom of the stairs. If you do not have them, ask someone to add them for you. What else can I do to help prevent falls? Wear shoes that: Do not have high heels. Have rubber bottoms. Are comfortable and fit you well. Are closed at the toe. Do not wear sandals. If you use a  stepladder: Make sure that it is fully opened. Do not climb a closed stepladder. Make sure that both sides of the stepladder are locked into place. Ask someone to hold it for you, if possible. Clearly mark and make sure that you can see: Any grab bars or handrails. First and last steps. Where the edge of each step is. Use tools that help you move around (mobility aids) if they are needed. These include: Canes. Walkers. Scooters. Crutches. Turn on the lights when you go into a dark area. Replace any light bulbs as soon as they burn out. Set up your furniture so you have a clear path. Avoid moving your furniture around. If any of your floors are uneven, fix them. If there are any pets around you, be aware of where they are. Review your medicines with your doctor. Some medicines can make you feel dizzy. This can increase your chance of falling. Ask your doctor what other things that you can do to help prevent falls. This information is not intended to replace advice given to you by your health care provider. Make sure you discuss any questions you have with your health care provider. Document Released: 12/13/2008 Document Revised: 07/25/2015 Document Reviewed: 03/23/2014 Elsevier Interactive Patient Education  2017 [employer].

## 2021-02-11 ENCOUNTER — Other Ambulatory Visit: Payer: Self-pay

## 2021-02-11 ENCOUNTER — Ambulatory Visit (INDEPENDENT_AMBULATORY_CARE_PROVIDER_SITE_OTHER): Payer: Medicare Other | Admitting: Family Medicine

## 2021-02-11 ENCOUNTER — Encounter: Payer: Self-pay | Admitting: Family Medicine

## 2021-02-11 VITALS — BP 132/78 | HR 81 | Temp 98.1°F | Ht 60.0 in | Wt 123.8 lb

## 2021-02-11 DIAGNOSIS — M545 Low back pain, unspecified: Secondary | ICD-10-CM | POA: Diagnosis not present

## 2021-02-11 DIAGNOSIS — Z87891 Personal history of nicotine dependence: Secondary | ICD-10-CM

## 2021-02-11 DIAGNOSIS — I10 Essential (primary) hypertension: Secondary | ICD-10-CM | POA: Diagnosis not present

## 2021-02-11 DIAGNOSIS — E785 Hyperlipidemia, unspecified: Secondary | ICD-10-CM | POA: Diagnosis not present

## 2021-02-11 DIAGNOSIS — H938X1 Other specified disorders of right ear: Secondary | ICD-10-CM | POA: Diagnosis not present

## 2021-02-11 DIAGNOSIS — I700 Atherosclerosis of aorta: Secondary | ICD-10-CM | POA: Diagnosis not present

## 2021-02-11 DIAGNOSIS — G8929 Other chronic pain: Secondary | ICD-10-CM

## 2021-02-11 DIAGNOSIS — R739 Hyperglycemia, unspecified: Secondary | ICD-10-CM | POA: Diagnosis not present

## 2021-02-11 DIAGNOSIS — M810 Age-related osteoporosis without current pathological fracture: Secondary | ICD-10-CM

## 2021-02-11 MED ORDER — TRAMADOL HCL 50 MG PO TABS: 25.0000 mg | ORAL_TABLET | Freq: Two times a day (BID) | ORAL | 1 refills | Status: DC | PRN

## 2021-02-11 NOTE — Patient Instructions (Addendum)
Health Maintenance Due  Topic Date Due   Zoster Vaccines- Shingrix (1 of 2)  Please check with your pharmacy to see if they have the shingrix vaccine. If they do- please get this immunization and update [place] by phone call or mychart with dates you receive the vaccine  Never done   Please stop by lab before you go If you have mychart- we will send your results within 3 business days of [place] receiving them.  If you do not have mychart- we will call you about results within 5 business days of [place] receiving them.  *please also note that you will see labs on mychart as soon as they post. I will later go in and write notes on them- will say "notes from Dr. Durene Cal"  We will call you within two weeks about your referral to ENT. If you do not hear within 2 weeks, give [place] a call.   Please schedule to have a bone density by February 2023 at the front desk before you leave today. If this is stable we will likely stop the fosamax- honestly may stop either way  Recommended follow up: Return in about 6 months (around 08/12/2021) for a follow-up or sooner if needed. Front desk please look for early May 2023 availability for patient.

## 2021-02-11 NOTE — Progress Notes (Addendum)
Phone [phone] In person visit   Subjective:   Sheena [name] is a 75 y.o. year old very pleasant female patient who presents for/with See problem oriented charting Chief Complaint  Patient presents with   Follow-up   Hypertension   Hyperlipidemia    This visit occurred during the SARS-CoV-2 public health emergency.  Safety protocols were in place, including screening questions prior to the visit, additional usage of staff PPE, and extensive cleaning of exam room while observing appropriate contact time as indicated for disinfecting solutions.   Past Medical History-  Patient Active Problem List   Diagnosis Date Noted   Grade II diastolic dysfunction 03/06/2015    Priority: High   Former smoker 02/19/2014    Priority: High   Disequilibrium 02/19/2014    Priority: High   Aortic atherosclerosis (HCC) 01/18/2020    Priority: Medium    Hyperlipidemia 03/05/2014    Priority: Medium    Chronic low back pain 12/13/2012    Priority: Medium    Essential hypertension, benign 09/03/2012    Priority: Medium    Ataxia 04/01/2011    Priority: Medium    Osteoporosis 03/21/2010    Priority: Medium    MURMUR 03/21/2010    Priority: Medium    Hyperglycemia 03/21/2010    Priority: Medium    History of vertebral compression fracture 01/04/2013    Priority: Low   Sinusitis, chronic 09/03/2012    Priority: Low   Anxiety 04/01/2011    Priority: Low   Blurred vision 03/21/2010    Priority: Low    Medications- reviewed and updated Current Outpatient Medications  Medication Sig Dispense Refill   acetaminophen (TYLENOL) 500 MG tablet Take 500 mg by mouth 2 (two) times daily as needed for mild pain. Reported on 06/04/2015     alendronate (FOSAMAX) 70 MG tablet TAKE 1 TABLET BY MOUTH EVERY 7 DAYS. TAKE WITH A FULL GLASS OF WATER ON AN EMPTY STOMACH 13 tablet 3   amLODipine (NORVASC) 5 MG tablet Take 1 tablet (5 mg total) by mouth daily. 90 tablet 3   aspirin EC 81 MG tablet Take 81 mg  by mouth 2 (two) times a week.     atorvastatin (LIPITOR) 20 MG tablet TAKE 1 TABLET BY MOUTH EVERY DAY 90 tablet 3   furosemide (LASIX) 40 MG tablet TAKE 1 TABLET BY MOUTH EVERY DAY 90 tablet 1   metoprolol tartrate (LOPRESSOR) 50 MG tablet TAKE 1 TABLET BY MOUTH TWICE A DAY 180 tablet 1   traMADol (ULTRAM) 50 MG tablet Take 0.5 tablets (25 mg total) by mouth 2 (two) times daily as needed. for pain 30 tablet 1   No current facility-administered medications for this visit.     Objective:  BP 132/78    Pulse 81    Temp 98.1 F (36.7 C)    Ht 5' (1.524 m)    Wt 123 lb 12.8 oz (56.2 kg)    SpO2 97%    BMI 24.18 kg/m  Gen: NAD, resting comfortably Right ear growth noted protruding on antitragus- has almost dried brown appearance on portions- does not extend into the ear canal CV: RRR stable unchanged murmur Lungs: CTAB no crackles, wheeze, rhonchi Abdomen: soft/nontender/nondistended/normal bowel sounds. No rebound or guarding.  Ext: no edema Skin: warm, dry Neuro: no hearing issues    Assessment and Plan   #hypertension S: medication: Lasix 40 mg every other day (non lasix day today, Metoprolol 50 mg twice daily, Amlodipine 5 mg  Home readings #s:  typically in the 120's to 130's in past- hasnt checked after covid shot for a little while- arm had been hurting BP Readings from Last 3 Encounters:  02/11/21 132/78  08/27/20 (!) 143/78  01/18/20 120/80  A/P: Controlled. Continue current medications.  -Monitor renal function-slight decrease in GFR last visit  #hyperlipidemia/aortic atherosclerosis S: Medication: atorvastatin 20 mg daily.  She prefers to take aspirin for primary prevention Lab Results  Component Value Date   CHOL 154 07/18/2019   HDL 53.40 07/18/2019   LDLCALC 77 07/18/2019   LDLDIRECT 189.3 03/05/2014   TRIG 121.0 07/18/2019   CHOLHDL 3 07/18/2019  A/P: Cholesterol has been above ideal goal of 70 or less-update lipid panel with LDL goal 70 or less and adjust meds  potentially to help reach goal -prefers to take aspirin -would be open to considering 40mg  atorvastatin  # Osteoporosis S:medication: fosamax 70 mg on Sunday morning. Takes calcium/vitamin D supplements   Last DEXA: 02/04/2016  Last vitamin D Lab Results  Component Value Date   VD25OH 40.21 07/18/2019  A/P: Patient should continue calcium 1200mg  per day and vitamin D 1000 units a day.  Started Fosamax in February 2018-we discussed stopping X February but I would like to update a bone density to see where things stand.  Check vitamin D  #former smoker- quit Jul 09 2017. Declines lung cancer screening program despite husband with lung cancer history -agrees to UA  # disequilibrium/ataxia S: ongoing issues at least to 2012. CT brain 10/13/10. Has improved slightly over time and some days with no issues . Worse if very cold temps A/P: variable but no worsening- continue to monitor- had declined vestibular rehab   # Chronic low back pain S: kyphoplasty dec 2014. Takes two tylenol at 1 PM and 9 30 pm. Also uses very sparing tramadol half tablet- flare ups in certain situations like raining or cold A/P: overall stable with some ups and downs - mainly tylenol lately but misses having tramadol (no issues with falls on this) -refill tramadol  #Hyperglycemia-patient has a history of slightly elevated blood sugars but her A1c's have been okay in the past  Lab Results  Component Value Date   HGBA1C 5.6 01/18/2020   HGBA1C 5.7 07/18/2019   HGBA1C 5.6 06/30/2017  -Hopefully at least stable-update A1c-was not in prediabetes range at last visit  #Ear growth/mass right ear-patient states she has had this for about 30 years but I have not noted on prior exams-she agrees to allow 07/20/2019 to send to ENT to get their opinion-strongly suspect this is benign but would like their expert opinion on if this should be removed/biopsied  Recommended follow up: Return in about 6 months (around 08/12/2021) for a  follow-up or sooner if needed. Future Appointments  Date Time Provider Department Center  01/09/2022  2:30 PM [facility]   Lab/Order associations:   ICD-10-CM   1. Essential hypertension, benign  I10 POCT Urinalysis Dipstick (Automated)    2. Hyperglycemia  R73.9 HgB A1c    3. Hyperlipidemia, unspecified hyperlipidemia type  E78.5 CBC with Differential/Platelet    Comprehensive metabolic panel    Lipid panel    4. Former smoker  Z87.891 POCT Urinalysis Dipstick (Automated)    5. Age-related osteoporosis without current pathological fracture  M81.0 VITAMIN D 25 Hydroxy (Vit-D Deficiency, Fractures)    DG Bone Density    6. Chronic bilateral low back pain without sciatica  M54.50    G89.29     7.  Aortic atherosclerosis (HCC) Chronic I70.0       Meds ordered this encounter  Medications   traMADol (ULTRAM) 50 MG tablet    Sig: Take 0.5 tablets (25 mg total) by mouth 2 (two) times daily as needed. for pain    Dispense:  30 tablet    Refill:  1    I,Harris Phan,acting as a scribe for Tana Conch, MD.,have documented all relevant documentation on the behalf of Tana Conch, MD,as directed by  Tana Conch, MD while in the presence of Tana Conch, MD.    I, Tana Conch, MD, have reviewed all documentation for this visit. The documentation on 02/11/21 for the exam, diagnosis, procedures, and orders are all accurate and complete.   Return precautions advised.  Tana Conch, MD

## 2021-02-12 LAB — COMPREHENSIVE METABOLIC PANEL
ALT: 10 U/L (ref 0–35)
AST: 20 U/L (ref 0–37)
Albumin: 4.5 g/dL (ref 3.5–5.2)
Alkaline Phosphatase: 57 U/L (ref 39–117)
BUN: 18 mg/dL (ref 6–23)
CO2: 28 mEq/L (ref 19–32)
Calcium: 10.4 mg/dL (ref 8.4–10.5)
Chloride: 100 mEq/L (ref 96–112)
Creatinine, Ser: 1.27 mg/dL — ABNORMAL HIGH (ref 0.40–1.20)
GFR: 41.93 mL/min — ABNORMAL LOW (ref >60.00)
Glucose, Bld: 104 mg/dL — ABNORMAL HIGH (ref 70–99)
Potassium: 5.1 mEq/L (ref 3.5–5.1)
Sodium: 137 mEq/L (ref 135–145)
Total Bilirubin: 0.3 mg/dL (ref 0.2–1.2)
Total Protein: 7.9 g/dL (ref 6.0–8.3)

## 2021-02-12 LAB — CBC WITH DIFFERENTIAL/PLATELET
Basophils Absolute: 0.1 10*3/uL (ref 0.0–0.1)
Basophils Relative: 0.7 % (ref 0.0–3.0)
Eosinophils Absolute: 0.4 10*3/uL (ref 0.0–0.7)
Eosinophils Relative: 4 % (ref 0.0–5.0)
HCT: 35.4 % — ABNORMAL LOW (ref 36.0–46.0)
Hemoglobin: 11.6 g/dL — ABNORMAL LOW (ref 12.0–15.0)
Lymphocytes Relative: 28.8 % (ref 12.0–46.0)
Lymphs Abs: 3 10*3/uL (ref 0.7–4.0)
MCHC: 32.9 g/dL (ref 30.0–36.0)
MCV: 97.3 fl (ref 78.0–100.0)
Monocytes Absolute: 0.9 10*3/uL (ref 0.1–1.0)
Monocytes Relative: 8.7 % (ref 3.0–12.0)
Neutro Abs: 6 10*3/uL (ref 1.4–7.7)
Neutrophils Relative %: 57.8 % (ref 43.0–77.0)
Platelets: 358 10*3/uL (ref 150.0–400.0)
RBC: 3.64 Mil/uL — ABNORMAL LOW (ref 3.87–5.11)
RDW: 12.6 % (ref 11.5–15.5)
WBC: 10.3 10*3/uL (ref 4.0–10.5)

## 2021-02-12 LAB — LIPID PANEL
Cholesterol: 144 mg/dL (ref 0–200)
HDL: 63.4 mg/dL (ref >39.00)
LDL Cholesterol: 55 mg/dL (ref 0–99)
NonHDL: 80.95
Total CHOL/HDL Ratio: 2
Triglycerides: 132 mg/dL (ref 0.0–149.0)
VLDL: 26.4 mg/dL (ref 0.0–40.0)

## 2021-02-12 LAB — HEMOGLOBIN A1C: Hgb A1c MFr Bld: 5.7 % (ref 4.6–6.5)

## 2021-03-06 ENCOUNTER — Other Ambulatory Visit: Payer: Self-pay

## 2021-03-06 ENCOUNTER — Other Ambulatory Visit (INDEPENDENT_AMBULATORY_CARE_PROVIDER_SITE_OTHER): Payer: Medicare Other

## 2021-03-06 DIAGNOSIS — I10 Essential (primary) hypertension: Secondary | ICD-10-CM | POA: Diagnosis not present

## 2021-03-06 DIAGNOSIS — Z87891 Personal history of nicotine dependence: Secondary | ICD-10-CM | POA: Diagnosis not present

## 2021-03-06 LAB — POC URINALSYSI DIPSTICK (AUTOMATED)
Bilirubin, UA: NEGATIVE
Blood, UA: POSITIVE
Glucose, UA: NEGATIVE
Ketones, UA: NEGATIVE
Leukocytes, UA: NEGATIVE
Nitrite, UA: NEGATIVE
Protein, UA: NEGATIVE
Spec Grav, UA: 1.015 (ref 1.010–1.025)
Urobilinogen, UA: 0.2 E.U./dL
pH, UA: 6 (ref 5.0–8.0)

## 2021-03-13 ENCOUNTER — Other Ambulatory Visit: Payer: Self-pay | Admitting: Family Medicine

## 2021-03-14 ENCOUNTER — Other Ambulatory Visit: Payer: Self-pay | Admitting: Family Medicine

## 2021-03-18 ENCOUNTER — Ambulatory Visit (INDEPENDENT_AMBULATORY_CARE_PROVIDER_SITE_OTHER)
Admission: RE | Admit: 2021-03-18 | Discharge: 2021-03-18 | Disposition: A | Discharge status: Home / Self Care | Payer: Medicare Other | Source: Ambulatory Visit | Attending: Family Medicine | Admitting: Family Medicine

## 2021-03-18 ENCOUNTER — Other Ambulatory Visit: Payer: Self-pay

## 2021-03-18 DIAGNOSIS — M810 Age-related osteoporosis without current pathological fracture: Secondary | ICD-10-CM

## 2021-05-12 ENCOUNTER — Other Ambulatory Visit: Payer: Self-pay | Admitting: Family Medicine

## 2021-05-12 MED ORDER — AMLODIPINE BESYLATE 5 MG PO TABS: 5.0000 mg | ORAL_TABLET | Freq: Every day | ORAL | 3 refills | Status: DC

## 2021-05-12 MED ORDER — TRAMADOL HCL 50 MG PO TABS: 25.0000 mg | ORAL_TABLET | Freq: Two times a day (BID) | ORAL | 1 refills | Status: DC | PRN

## 2021-05-12 NOTE — Telephone Encounter (Signed)
.. ?  Encourage patient to contact the pharmacy for refills or they can request refills through [facility] ? ?LAST APPOINTMENT DATE: 02/11/21 ? ?NEXT APPOINTMENT DATE: 07/24/21 (might come in sooner) ? ?MEDICATION: traMADol (ULTRAM) 50 MG tablet ?amLODipine (NORVASC) 5 MG tablet ? ?Is the patient out of medication? yes ? ?PHARMACY: [facility] [address]  ? ?Let patient know to contact pharmacy at the end of the day to make sure medication is ready. ? ?Please notify patient to allow 48-72 hours to process  ?

## 2021-05-12 NOTE — Telephone Encounter (Signed)
Request sent to Dr. Durene Cal for approval. Amlodipine refilled.  ?

## 2021-05-13 ENCOUNTER — Other Ambulatory Visit: Payer: Self-pay | Admitting: Family Medicine

## 2021-05-13 NOTE — Telephone Encounter (Signed)
Patient states she is in a lot of pain and would like this sent in. Patient would like a call once its sent in.  ?

## 2021-05-16 ENCOUNTER — Other Ambulatory Visit: Payer: Self-pay | Admitting: Family Medicine

## 2021-05-21 ENCOUNTER — Telehealth: Payer: Self-pay | Admitting: Family Medicine

## 2021-05-21 NOTE — Telephone Encounter (Signed)
See below

## 2021-05-21 NOTE — Telephone Encounter (Signed)
Pt's husband states pt is in severe pain from her surgery and is constipated. He is asking for advice. At this point, nothing is helping the pain. And she is currently taking 1 dose of Miralax. Can they increase the Miralax? Please advise. ?

## 2021-05-22 NOTE — Telephone Encounter (Signed)
Could add stool softener like docusate/Colace.  Also can increase MiraLAX to twice a day-typically takes 24 to 48 hours to have a bowel movement after taking it.  As long as still passing gas okay to [facility] care if also not passing gas and pain worsens ?

## 2021-05-22 NOTE — Telephone Encounter (Signed)
She was given 30 with 1 refill of tramadol on 05/12/2021 it appears-she should not have been able to go through both of those prescriptions the way they are written-has she picked up the other 1 ? ?Also make sure she knows that tramadol can cause constipation ?

## 2021-05-22 NOTE — Telephone Encounter (Signed)
Called and lm on pt vm tcb. 

## 2021-05-22 NOTE — Telephone Encounter (Signed)
Returned call and spoke with pt husband, below message given and they would also like to have some Tramadol called in for pt back pain. States back pain has flared up. ?

## 2021-05-22 NOTE — Telephone Encounter (Addendum)
[phone]  Please return call when available. ?

## 2021-05-23 NOTE — Telephone Encounter (Signed)
Called and spoke with pt wife and message given. ?

## 2021-05-26 NOTE — Progress Notes (Incomplete)
? ?Phone [phone] ?In person visit ?  ?Subjective:  ? ?Sheena [name] is a 76 y.o. year old very pleasant female patient who presents for/with See problem oriented charting ?No chief complaint on file. ? ? ?This visit occurred during the SARS-CoV-2 public health emergency.  Safety protocols were in place, including screening questions prior to the visit, additional usage of staff PPE, and extensive cleaning of exam room while observing appropriate contact time as indicated for disinfecting solutions.  ? ?Past Medical History-  ?Patient Active Problem List  ? Diagnosis Date Noted  ? Aortic atherosclerosis (HCC) 01/18/2020  ? Grade II diastolic dysfunction 03/06/2015  ? Hyperlipidemia 03/05/2014  ? Former smoker 02/19/2014  ? Disequilibrium 02/19/2014  ? History of vertebral compression fracture 01/04/2013  ? Chronic low back pain 12/13/2012  ? Sinusitis, chronic 09/03/2012  ? Essential hypertension, benign 09/03/2012  ? Ataxia 04/01/2011  ? Anxiety 04/01/2011  ? Blurred vision 03/21/2010  ? Osteoporosis 03/21/2010  ? MURMUR 03/21/2010  ? Hyperglycemia 03/21/2010  ? ? ?Medications- reviewed and updated ?Current Outpatient Medications  ?Medication Sig Dispense Refill  ? acetaminophen (TYLENOL) 500 MG tablet Take 500 mg by mouth 2 (two) times daily as needed for mild pain. Reported on 06/04/2015    ? alendronate (FOSAMAX) 70 MG tablet TAKE 1 TABLET BY MOUTH EVERY 7 DAYS. TAKE WITH A FULL GLASS OF WATER ON AN EMPTY STOMACH 13 tablet 3  ? amLODipine (NORVASC) 5 MG tablet Take 1 tablet (5 mg total) by mouth daily. 90 tablet 3  ? aspirin EC 81 MG tablet Take 81 mg by mouth 2 (two) times a week.    ? atorvastatin (LIPITOR) 20 MG tablet TAKE 1 TABLET BY MOUTH EVERY DAY 90 tablet 3  ? cyclobenzaprine (FLEXERIL) 5 MG tablet TAKE 1 TABLET BY MOUTH TWICE A DAY AS NEEDED FOR MUSCLE SPASM 30 tablet 1  ? furosemide (LASIX) 40 MG tablet TAKE 1 TABLET BY MOUTH EVERY DAY 90 tablet 1  ? metoprolol tartrate (LOPRESSOR) 50 MG tablet  TAKE 1 TABLET BY MOUTH TWICE A DAY 180 tablet 1  ? traMADol (ULTRAM) 50 MG tablet Take 0.5 tablets (25 mg total) by mouth 2 (two) times daily as needed. for pain 30 tablet 1  ? ?No current facility-administered medications for this visit.  ? ?  ?Objective:  ?There were no vitals taken for this visit. ?Gen: NAD, resting comfortably ?CV: RRR no murmurs rubs or gallops ?Lungs: CTAB no crackles, wheeze, rhonchi ?Abdomen: soft/nontender/nondistended/normal bowel sounds. No rebound or guarding.  ?Ext: no edema ?Skin: warm, dry ?Neuro: grossly normal, moves all extremities ? ?*** ?  ? ?Assessment and Plan  ? ?***repeat BMP with GFR aroudn 50 07/19/19 ? ?#hypertension ?S: medication: Lasix 40mg , Metoprolol 50mg  twice daily, Amlodipine 5 mg  ?Home readings #s: *** ?BP Readings from Last 3 Encounters:  ?02/11/21 132/78  ?08/27/20 (!) 143/78  ?01/18/20 120/80  ?A/P: *** ? ?#hyperlipidemia/aortic atherosclerosis ?S: Medication: atorvastatin 20mg  daily  ?Lab Results  ?Component Value Date  ? CHOL 144 02/11/2021  ? HDL 63.40 02/11/2021  ? LDLCALC 55 02/11/2021  ? LDLDIRECT 189.3 03/05/2014  ? TRIG 132.0 02/11/2021  ? CHOLHDL 2 02/11/2021  ? A/P: *** ? ?# Osteoporosis ?S:medication: fosamax 40 mg on Sunday morning. Takes calcium/vitamin D.  ? ?Last bone density: 02/04/2016. ?A/P: ***  ? ?#former smoker- quit Jul 09 2017. Declined lung cancer screening program  ? ?# disequilibrium/ataxia ?S: ongoing issues at least to 2012. CT brain 10/13/10. improved slightly over time and  some days with no issues   ?A/P: ***  ? ?# Chronic low back pain ?S: kyphoplasty dec 2014. Takes two tylenol at 1 PM and 9 30 pm. Also uses very sparing tramadol half tablet.  ?A/P: ***  ? ?#Hyperglycemia-patient has a history of slightly elevated blood sugars but her A1c's have been okay in the past  ?Lab Results  ?Component Value Date  ? HGBA1C 5.7 02/11/2021  ? HGBA1C 5.6 01/18/2020  ? HGBA1C 5.7 07/18/2019  ? ? A/P: *** ? ?Health Maintenance Due  ?Topic Date  Due  ? Zoster Vaccines- Shingrix (1 of 2) Never done  ? ?Recommended follow up: No follow-ups on file. ?Future Appointments  ?Date Time Provider Department Center  ?06/02/2021  1:00 PM [name], MD [facility]  ?01/09/2022  2:30 PM [facility]  ? ? ?Lab/Order associations: ?No diagnosis found. ? ?No orders of the defined types were placed in this encounter. ? ? ?Return precautions advised.  ?Scheryl Marten ? ? ?

## 2021-05-30 ENCOUNTER — Telehealth: Payer: Self-pay | Admitting: Family Medicine

## 2021-05-30 NOTE — Telephone Encounter (Signed)
Patients husband called stated patient is unable to leave home. She is in a lot of pain, Patient wants to sch 4/3 apt as VV instead of OV - Please clarify if this is ok.  ? ?Patient is in a lot of pain to go down stairs.  ?

## 2021-05-30 NOTE — Telephone Encounter (Signed)
Yes, virtual is fine as long as they have access to do virtual and not telephone call.  ?

## 2021-06-02 ENCOUNTER — Other Ambulatory Visit: Payer: Self-pay | Admitting: Family Medicine

## 2021-06-02 ENCOUNTER — Telehealth: Payer: Medicare Other | Admitting: Family Medicine

## 2021-06-02 DIAGNOSIS — Z87891 Personal history of nicotine dependence: Secondary | ICD-10-CM

## 2021-06-02 DIAGNOSIS — M810 Age-related osteoporosis without current pathological fracture: Secondary | ICD-10-CM

## 2021-06-02 DIAGNOSIS — I10 Essential (primary) hypertension: Secondary | ICD-10-CM

## 2021-06-02 DIAGNOSIS — I700 Atherosclerosis of aorta: Secondary | ICD-10-CM

## 2021-06-02 DIAGNOSIS — R739 Hyperglycemia, unspecified: Secondary | ICD-10-CM

## 2021-06-02 DIAGNOSIS — E785 Hyperlipidemia, unspecified: Secondary | ICD-10-CM

## 2021-07-07 ENCOUNTER — Ambulatory Visit (INDEPENDENT_AMBULATORY_CARE_PROVIDER_SITE_OTHER): Payer: Medicare Other | Admitting: Family Medicine

## 2021-07-07 ENCOUNTER — Encounter: Payer: Self-pay | Admitting: Family Medicine

## 2021-07-07 VITALS — BP 110/53 | HR 74 | Temp 97.9°F | Ht 60.0 in | Wt 113.8 lb

## 2021-07-07 DIAGNOSIS — E785 Hyperlipidemia, unspecified: Secondary | ICD-10-CM

## 2021-07-07 DIAGNOSIS — M810 Age-related osteoporosis without current pathological fracture: Secondary | ICD-10-CM

## 2021-07-07 DIAGNOSIS — R739 Hyperglycemia, unspecified: Secondary | ICD-10-CM

## 2021-07-07 DIAGNOSIS — I10 Essential (primary) hypertension: Secondary | ICD-10-CM | POA: Diagnosis not present

## 2021-07-07 DIAGNOSIS — I700 Atherosclerosis of aorta: Secondary | ICD-10-CM | POA: Diagnosis not present

## 2021-07-07 MED ORDER — TRAMADOL HCL 50 MG PO TABS: 25.0000 mg | ORAL_TABLET | Freq: Two times a day (BID) | ORAL | 2 refills | Status: DC | PRN

## 2021-07-07 NOTE — Progress Notes (Signed)
?Phone [phone] ?In person visit ?  ?Subjective:  ? ?Sheena [name] is a 76 y.o. year old very pleasant female patient who presents for/with See problem oriented charting ?Chief Complaint  ?Patient presents with  ? Follow-up  ? Hypertension  ? Hyperlipidemia  ? Back Pain  ?  Pt c/o her lower back pain flaring up again.  ? ? ?Past Medical History-  ?Patient Active Problem List  ? Diagnosis Date Noted  ? Grade II diastolic dysfunction 03/06/2015  ?  Priority: High  ? Former smoker 02/19/2014  ?  Priority: High  ? Disequilibrium 02/19/2014  ?  Priority: High  ? Aortic atherosclerosis (HCC) 01/18/2020  ?  Priority: Medium   ? Hyperlipidemia 03/05/2014  ?  Priority: Medium   ? Chronic low back pain 12/13/2012  ?  Priority: Medium   ? Essential hypertension, benign 09/03/2012  ?  Priority: Medium   ? Ataxia 04/01/2011  ?  Priority: Medium   ? Osteoporosis 03/21/2010  ?  Priority: Medium   ? MURMUR 03/21/2010  ?  Priority: Medium   ? Hyperglycemia 03/21/2010  ?  Priority: Medium   ? History of vertebral compression fracture 01/04/2013  ?  Priority: Low  ? Sinusitis, chronic 09/03/2012  ?  Priority: Low  ? Anxiety 04/01/2011  ?  Priority: Low  ? Blurred vision 03/21/2010  ?  Priority: Low  ? ? ?Medications- reviewed and updated ?Current Outpatient Medications  ?Medication Sig Dispense Refill  ? acetaminophen (TYLENOL) 500 MG tablet Take 500 mg by mouth 2 (two) times daily as needed for mild pain. Reported on 06/04/2015    ? amLODipine (NORVASC) 5 MG tablet Take 1 tablet (5 mg total) by mouth daily. 90 tablet 3  ? aspirin EC 81 MG tablet Take 81 mg by mouth 2 (two) times a week.    ? atorvastatin (LIPITOR) 20 MG tablet TAKE 1 TABLET BY MOUTH EVERY DAY 90 tablet 3  ? cyclobenzaprine (FLEXERIL) 5 MG tablet TAKE 1 TABLET BY MOUTH TWICE A DAY AS NEEDED FOR MUSCLE SPASMS 30 tablet 1  ? furosemide (LASIX) 40 MG tablet TAKE 1 TABLET BY MOUTH EVERY DAY 90 tablet 1  ? metoprolol tartrate (LOPRESSOR) 50 MG tablet TAKE 1 TABLET BY  MOUTH TWICE A DAY 180 tablet 1  ? traMADol (ULTRAM) 50 MG tablet Take 0.5-1 tablets (25-50 mg total) by mouth 2 (two) times daily as needed. For chronic low back pain. May refill monthly 60 tablet 2  ? ?No current facility-administered medications for this visit.  ? ?  ?Objective:  ?BP 138/78   Pulse 74   Temp 97.9 ?F (36.6 ?C)   Ht 5' (1.524 m)   Wt 113 lb 12.8 oz (51.6 kg)   SpO2 98%   BMI 22.23 kg/m?  ?Gen: NAD, resting comfortably ?CV: RRR stable murmur-known mitral regurgitation  ?Lungs: CTAB no crackles, wheeze, rhonchi ?Abdomen: soft/nontender/nondistended/normal bowel sounds. No rebound or guarding.  ?Ext: no edema ?Skin: warm, dry, small protruding but stable growth from right ear ?  ? ?Assessment and Plan  ? ?#hypertension ?S: medication:  Lasix 40mg , Metoprolol 50mg  twice daily, Amlodipine 5 mg  ?Home readings #s: typically in the 120's to 130's before pain increased ?BP Readings from Last 3 Encounters:  ?07/07/21 138/78  ?02/11/21 132/78  ?08/27/20 (!) 143/78  ? A/P: Blood pressure high normal the patient is certainly in some pain today with her back-we will continue current medication for now and she agrees to start rechecking at home and  let us know if consistently above 140-goal at home ideally would be less than 135/85 ? ?#hyperlipidemia/aortic atherosclerosis ?S: Medication: atorvastatin 20mg  daily,  aspirin 81 mg ?Lab Results  ?Component Value Date  ? CHOL 144 02/11/2021  ? HDL 63.40 02/11/2021  ? LDLCALC 55 02/11/2021  ? LDLDIRECT 189.3 03/05/2014  ? TRIG 132.0 02/11/2021  ? CHOLHDL 2 02/11/2021  ? A/P: Excellent control last check-continue current medication ?-For aortic atherosclerosis at goal with LDL under 70-continue resector modification and presumed stable ? ?# Osteoporosis ?S:medication: fosamax 70mg  on Sunday morning. Takes calcium/vitamin D.  Started in February 2018 ? ?Last bone density: 03/18/2021 with improvements in multiple areas ?A/P: Significant improvement in bone  density-discussed option of holding medicine at this time and following bone density curve- while continuing calcium, vitamin D, weightbearing exercise as tolerated ?- Stop alendronate/fosamax for now- we can recheck in 2-3 years ? ?#former smoker- quit Jul 09 2017. Declines lung cancer screening program again today.  ? ?# disequilibrium/ataxia ?S: ongoing issues at least to 2012. CT brain 10/13/10.  Has improved slightly over time and some days with no issues - slightly worse with tramadol bu tno falls  ?A/P: overall stable- continue current meds  ? ?# Chronic low back pain ?S: kyphoplasty dec 2014.  Takes two tylenol at 1 PM and 9 30 pm. Also uses very sparing tramadol half tablet.  ?-prior kyphoplasty at t12, later had another at t8 ?-Has been bothering her more recently since march-starting to improve ?-pharmacy only giving partial supplies- we will change rx to include chronic pain in hopes this helps. Since January she has only received 53 in over 4 months ?A/P: discussed could have another compression fracture- offered to update x-ray or send to sports medicine- she wants to hold off and monitor for now  ?-will refill tramadol and change rx ?- did have some weight loss- states pain took away appetite- doing better now- encouraged no further weight loss ? ? # Hyperglycemia/insulin resistance/prediabetes-A1c 5.7 in 2021 ?S:  Medication: None ?A/P: stable on last check- discussed checking a1c and CMP- she declines- plan had bene to monitor creatinine- with GFR in 40s ? ?#Right ear growth-recommend ENT visit last visit but patient had reported there over 30 years- had to call and cancel but plans to call to reschedule with Dr. February . Appears stable- she wants to potentially have it removed ?  ?Recommended follow up: Return in about 6 months (around 01/07/2022) for followup or sooner if needed.Schedule b4 you leave. ?Future Appointments  ?Date Time Provider Department Center  ?01/09/2022  2:30 PM [facility]  ? ?Lab/Order associations: ?  ICD-10-CM   ?1. Essential hypertension, benign  I10   ?  ?2. Hyperlipidemia, unspecified hyperlipidemia type  E78.5   ?  ?3. Hyperglycemia  R73.9   ?  ?4. Age-related osteoporosis without current pathological fracture  M81.0   ?  ?5. Aortic atherosclerosis (HCC)  I70.0   ?  ? ?Meds ordered this encounter  ?Medications  ? traMADol (ULTRAM) 50 MG tablet  ?  Sig: Take 0.5-1 tablets (25-50 mg total) by mouth 2 (two) times daily as needed. For chronic low back pain. May refill monthly  ?  Dispense:  60 tablet  ?  Refill:  2  ? ?Return precautions advised.  ?13/09/2021, MD ? ?

## 2021-07-07 NOTE — Patient Instructions (Addendum)
Let us know when you have gotten your [facility] vaccine at your pharmacy.  ? ?Blood pressure high normal the patient is certainly in some pain today with her back-we will continue current medication for now and she agrees to start rechecking at home and let us know if consistently above 140-goal at home ideally would be less than 135/85 ? ?Stop alendronate/fosamax for now- we can recheck in 2-3 years. Continue calcium and vitamin D ? ?Recommended follow up: Return in about 6 months (around 01/07/2022) for followup or sooner if needed.Schedule b4 you leave.  ?

## 2021-07-22 ENCOUNTER — Other Ambulatory Visit: Payer: Self-pay | Admitting: Family Medicine

## 2021-07-24 ENCOUNTER — Ambulatory Visit: Payer: Medicare Other | Admitting: Family Medicine

## 2021-08-06 ENCOUNTER — Telehealth: Payer: Self-pay | Admitting: Family Medicine

## 2021-08-06 NOTE — Telephone Encounter (Signed)
Pt called stating Dr. Durene Cal wanted her to monitor her BP for a month log the readings. According to pt, after a month she was due to call them in. Although BP was taken daily, I provided 2 readings a week for an average.   07/08/21- 107/53 07/09/21- 118/58 07/15/21- 115/56 07/16/21- 119/63 07/22/21- 117/52 07/23/21- 120/57 07/31/21- 120/54 08/01/21- 110/53

## 2021-08-07 NOTE — Telephone Encounter (Signed)
Ardine Bjork thanks so much- these look good- please go add these readings to the last office visit under "new reading" and in the comments state "most recent home reading"

## 2021-08-07 NOTE — Telephone Encounter (Signed)
See below

## 2021-08-07 NOTE — Telephone Encounter (Signed)
Most recent reading added.

## 2021-09-15 ENCOUNTER — Other Ambulatory Visit: Payer: Self-pay | Admitting: Family Medicine

## 2021-12-03 ENCOUNTER — Telehealth: Payer: Self-pay

## 2021-12-03 NOTE — Telephone Encounter (Signed)
Logan called and states a house call was done on pt and pt has bilateral foot and toe swelling. Mild PVD on the left foot 0.69 and on the right foot 0.67. she states pt had tuna and crackers for lunch but wants to know if she should be placed on a diuretic daily instead of every other day OR does she need to take an extra one PRN?

## 2021-12-03 NOTE — Telephone Encounter (Signed)
Furosemide is listed as daily-if she taking it every other day?  If taking every other day can try daily for a week or so and see if this calms down-schedule follow-up if not

## 2021-12-03 NOTE — Telephone Encounter (Signed)
Called and spoke with pt and below message given. 

## 2021-12-11 NOTE — Telephone Encounter (Signed)
Patient states she is taking Furosemide daily and it is working okay.

## 2022-01-01 ENCOUNTER — Ambulatory Visit (INDEPENDENT_AMBULATORY_CARE_PROVIDER_SITE_OTHER): Payer: Medicare Other | Admitting: Family Medicine

## 2022-01-01 ENCOUNTER — Encounter: Payer: Self-pay | Admitting: Family Medicine

## 2022-01-01 VITALS — BP 122/64 | HR 72 | Temp 97.4°F | Ht 60.0 in | Wt 112.6 lb

## 2022-01-01 DIAGNOSIS — M810 Age-related osteoporosis without current pathological fracture: Secondary | ICD-10-CM | POA: Diagnosis not present

## 2022-01-01 DIAGNOSIS — E785 Hyperlipidemia, unspecified: Secondary | ICD-10-CM | POA: Diagnosis not present

## 2022-01-01 DIAGNOSIS — R739 Hyperglycemia, unspecified: Secondary | ICD-10-CM

## 2022-01-01 DIAGNOSIS — R319 Hematuria, unspecified: Secondary | ICD-10-CM | POA: Diagnosis not present

## 2022-01-01 NOTE — Patient Instructions (Addendum)
Let us know when you get your next COVID vaccine and TDAP (tetanus) at the pharmacy. OR if you decide to do shingrix or pneumonia shot (prevnar 20)  Typically can do this by phone: You are eligible to schedule your annual wellness visit with our nurse specialist Otila Kluver.  Please consider scheduling this before you leave today  Please stop by lab before you go If you have mychart- we will send your results within 3 business days of [place] receiving them.  If you do not have mychart- we will call you about results within 5 business days of [place] receiving them.  *please also note that you will see labs on mychart as soon as they post. I will later go in and write notes on them- will say "notes from Dr. Yong Channel"   Recommended follow up: Return in about 6 months (around 07/02/2022) for physical or sooner if needed.Schedule b4 you leave.

## 2022-01-01 NOTE — Progress Notes (Signed)
Phone [phone] In person visit   Subjective:   Sheena [name] is a 76 y.o. year old very pleasant female patient who presents for/with See problem oriented charting Chief Complaint  Patient presents with   Follow-up    Past Medical History-  Patient Active Problem List   Diagnosis Date Noted   Grade II diastolic dysfunction 03/06/2015    Priority: High   Former smoker 02/19/2014    Priority: High   Disequilibrium 02/19/2014    Priority: High   Aortic atherosclerosis (HCC) 01/18/2020    Priority: Medium    Hyperlipidemia 03/05/2014    Priority: Medium    Chronic low back pain 12/13/2012    Priority: Medium    Essential hypertension, benign 09/03/2012    Priority: Medium    Ataxia 04/01/2011    Priority: Medium    Osteoporosis 03/21/2010    Priority: Medium    MURMUR 03/21/2010    Priority: Medium    Hyperglycemia 03/21/2010    Priority: Medium    History of vertebral compression fracture 01/04/2013    Priority: Low   Sinusitis, chronic 09/03/2012    Priority: Low   Anxiety 04/01/2011    Priority: Low   Blurred vision 03/21/2010    Priority: Low    Medications- reviewed and updated Current Outpatient Medications  Medication Sig Dispense Refill   acetaminophen (TYLENOL) 500 MG tablet Take 500 mg by mouth 2 (two) times daily as needed for mild pain. Reported on 06/04/2015     amLODipine (NORVASC) 5 MG tablet Take 1 tablet (5 mg total) by mouth daily. 90 tablet 3   aspirin EC 81 MG tablet Take 81 mg by mouth 2 (two) times a week.     atorvastatin (LIPITOR) 20 MG tablet TAKE 1 TABLET BY MOUTH EVERY DAY 90 tablet 3   cyclobenzaprine (FLEXERIL) 5 MG tablet TAKE 1 TABLET BY MOUTH TWICE A DAY AS NEEDED FOR MUSCLE SPASMS 30 tablet 1   furosemide (LASIX) 40 MG tablet TAKE 1 TABLET BY MOUTH EVERY DAY 90 tablet 1   metoprolol tartrate (LOPRESSOR) 50 MG tablet TAKE 1 TABLET BY MOUTH TWICE A DAY 180 tablet 1   traMADol (ULTRAM) 50 MG tablet Take 0.5-1 tablets (25-50 mg  total) by mouth 2 (two) times daily as needed. For chronic low back pain. May refill monthly 60 tablet 2   No current facility-administered medications for this visit.     Objective:  BP 122/64   Pulse 72   Temp (!) 97.4 F (36.3 C)   Ht 5' (1.524 m)   Wt 112 lb 9.6 oz (51.1 kg)   SpO2 98%   BMI 21.99 kg/m  Gen: NAD, resting comfortably CV: RRR stable murmur  lungs: CTAB no crackles, wheeze, rhonchi Abdomen: soft/nontender/nondistended/normal bowel sounds. No rebound or guarding.  Ext: no edema Skin: warm, dry     Assessment and Plan   #hypertension S: medication:  Lasix 40mg  daily, Metoprolol 50mg  twice daily, Amlodipine 5 mg  Home readings #s:120/61 on most recent check BP Readings from Last 3 Encounters:  01/01/22 122/64  08/07/21 (!) 110/53  02/11/21 132/78   A/P: Controlled. Continue current medications.   #hyperlipidemia/aortic atherosclerosis S: Medication: atorvastatin 20mg  daily  Lab Results  Component Value Date   CHOL 144 02/11/2021   HDL 63.40 02/11/2021   LDLCALC 55 02/11/2021   LDLDIRECT 189.3 03/05/2014   TRIG 132.0 02/11/2021   CHOLHDL 2 02/11/2021   A/P: Excellent control last check-continue current medication Also at goal for aortic  atherosclerosis under 70 (presumed stable)  # Osteoporosis S:medication: fosamax 70mg  on Sunday morning-stopped in January 2023 after at least 5 years of treatment. Takes calcium/vitamin D.   Last bone density: Improvement on DEXA 03/18/2021 A/P: hopefully stable despite stopping fosamax- will repeat 2-3 years from last dexa  -check vitamin D  #former smoker- quit Jul 09 2017. Declines lung cancer screening program  agin today.  - did have possible blood in urine in January- tried to add on urine microscopic but never got completed- will order today  # disequilibrium/ataxia S: ongoing issues at least to 2012. CT brain 10/13/10.  Has improved slightly over time and some days with no issues still today- other days  are tough  A/P: overall pretty stable- continue to monitor   # Chronic low back pain S: kyphoplasty dec 2014.  Takes two tylenol at 1 PM and 9 30 pm. Also uses very sparing tramadol half tablet. Or may take flexeril as needed A/P: doing ok- does not need refill yet    # Hyperglycemia/insulin resistance/prediabetes-A1c 5.7 in 2021 S:  Medication: None Exercise and diet- reasonably healthy diet- discussed exercise   Lab Results  Component Value Date   HGBA1C 5.7 02/11/2021   HGBA1C 5.6 01/18/2020   HGBA1C 5.7 07/18/2019  A/P: hopefully stable- update a1c today. Continue current meds for now  #HM- overdue tetanus, prevnar 20, shingrix, covid -plans to at least do tetanus at pharmacy  Recommended follow up: Return in about 6 months (around 07/02/2022) for physical or sooner if needed.Schedule b4 you leave. Future Appointments  Date Time Provider [place]  01/09/2022  2:30 PM [facility]    Lab/Order associations:   ICD-10-CM   1. Hematuria, unspecified type  R31.9 Urine Microscopic    2. Age-related osteoporosis without current pathological fracture  M81.0 VITAMIN D 25 Hydroxy (Vit-D Deficiency, Fractures)    3. Hyperglycemia  R73.9 HgB A1c    4. Hyperlipidemia, unspecified hyperlipidemia type  E78.5 CBC with Differential/Platelet    Comprehensive metabolic panel      No orders of the defined types were placed in this encounter.   Return precautions advised.  Garret Reddish, MD

## 2022-01-02 LAB — CBC WITH DIFFERENTIAL/PLATELET
Basophils Absolute: 0.3 10*3/uL — ABNORMAL HIGH (ref 0.0–0.1)
Basophils Relative: 2.5 % (ref 0.0–3.0)
Eosinophils Absolute: 0.4 10*3/uL (ref 0.0–0.7)
Eosinophils Relative: 3.6 % (ref 0.0–5.0)
HCT: 37.3 % (ref 36.0–46.0)
Hemoglobin: 12.3 g/dL (ref 12.0–15.0)
Lymphocytes Relative: 24.8 % (ref 12.0–46.0)
Lymphs Abs: 2.5 10*3/uL (ref 0.7–4.0)
MCHC: 32.9 g/dL (ref 30.0–36.0)
MCV: 98.2 fl (ref 78.0–100.0)
Monocytes Absolute: 1 10*3/uL (ref 0.1–1.0)
Monocytes Relative: 10.5 % (ref 3.0–12.0)
Neutro Abs: 5.8 10*3/uL (ref 1.4–7.7)
Neutrophils Relative %: 58.6 % (ref 43.0–77.0)
Platelets: 375 10*3/uL (ref 150.0–400.0)
RBC: 3.8 Mil/uL — ABNORMAL LOW (ref 3.87–5.11)
RDW: 13.2 % (ref 11.5–15.5)
WBC: 9.9 10*3/uL (ref 4.0–10.5)

## 2022-01-02 LAB — COMPREHENSIVE METABOLIC PANEL
ALT: 9 U/L (ref 0–35)
AST: 20 U/L (ref 0–37)
Albumin: 4.6 g/dL (ref 3.5–5.2)
Alkaline Phosphatase: 63 U/L (ref 39–117)
BUN: 19 mg/dL (ref 6–23)
CO2: 30 mEq/L (ref 19–32)
Calcium: 10.5 mg/dL (ref 8.4–10.5)
Chloride: 98 mEq/L (ref 96–112)
Creatinine, Ser: 1.35 mg/dL — ABNORMAL HIGH (ref 0.40–1.20)
GFR: 38.72 mL/min — ABNORMAL LOW (ref >60.00)
Glucose, Bld: 112 mg/dL — ABNORMAL HIGH (ref 70–99)
Potassium: 5.8 mEq/L — ABNORMAL HIGH (ref 3.5–5.1)
Sodium: 137 mEq/L (ref 135–145)
Total Bilirubin: 0.4 mg/dL (ref 0.2–1.2)
Total Protein: 7.7 g/dL (ref 6.0–8.3)

## 2022-01-02 LAB — HEMOGLOBIN A1C: Hgb A1c MFr Bld: 5.9 % (ref 4.6–6.5)

## 2022-01-02 LAB — VITAMIN D 25 HYDROXY (VIT D DEFICIENCY, FRACTURES): VITD: 42.84 ng/mL (ref 30.00–100.00)

## 2022-01-05 ENCOUNTER — Other Ambulatory Visit: Payer: Medicare Other

## 2022-01-05 LAB — URINALYSIS, MICROSCOPIC ONLY: RBC / HPF: NONE SEEN (ref 0–?)

## 2022-01-09 ENCOUNTER — Ambulatory Visit (INDEPENDENT_AMBULATORY_CARE_PROVIDER_SITE_OTHER): Payer: Medicare Other

## 2022-01-09 VITALS — Wt 112.0 lb

## 2022-01-09 DIAGNOSIS — Z0000 Encounter for general adult medical examination without abnormal findings: Secondary | ICD-10-CM
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
## 2022-01-09 NOTE — Patient Instructions (Signed)
Sheena [name] , Thank you for taking time to come for your Medicare Wellness Visit. I appreciate your ongoing commitment to your health goals. Please review the following plan we discussed and let me know if I can assist you in the future.   These are the goals we discussed:  Goals      patient     Loves to move more but back is stopping you now. Will try to get out more when feeling better      Patient Stated     Cut back on milk shakes     Patient Stated     Not eat as much junk food      Quit smoking / using tobacco     Smoking;  Educated to avoid secondary smoke Smoking cessation in [place]: [employer] Line at [phone] -day Smoking cessation in [place]: Evening; [phone] Smoking cessation at [facility]: [phone] Smoking cessation at [facility]: [phone] Classes offered a couple of times a month;  Will work with the patient as far as registration and location  Meds may help; chatix (Varenicline); Zyban (Bupropion SR); Nicotine Replacement (gum; lozenges; patches; etc.)   30 pack yr smoking hx: Educated regarding LDCT; To discuss with MD at next fup. Also educated on AAA screening for men 65-75 who have smoked          This is a list of the screening recommended for you and due dates:  Health Maintenance  Topic Date Due   Screening for Lung Cancer  Never done   COVID-19 Vaccine (5 - Pfizer series) 01/17/2022*   Zoster (Shingles) Vaccine (1 of 2) 04/03/2022*   Pneumonia Vaccine (1 - PCV) 01/02/2023*   Tetanus Vaccine  06/03/2065*   Mammogram  09/13/2022   Medicare Annual Wellness Visit  01/10/2023   DEXA scan (bone density measurement)  Completed   Hepatitis C Screening: USPSTF Recommendation to screen - Ages 33-79 yo.  Completed   HPV Vaccine  Aged Out   Flu Shot  Discontinued   Colon Cancer Screening  Discontinued  *Topic was postponed. The date shown is not the original due date.    Advanced directives: Please bring a copy of your health care power  of attorney and living will to the office at your convenience.  Conditions/risks identified: stay healthy and active   Next appointment: Follow up in one year for your annual wellness visit    Preventive Care 65 Years and Older, Female Preventive care refers to lifestyle choices and visits with your health care provider that can promote health and wellness. What does preventive care include? A yearly physical exam. This is also called an annual well check. Dental exams once or twice a year. Routine eye exams. Ask your health care provider how often you should have your eyes checked. Personal lifestyle choices, including: Daily care of your teeth and gums. Regular physical activity. Eating a healthy diet. Avoiding tobacco and drug use. Limiting alcohol use. Practicing safe sex. Taking low-dose aspirin every day. Taking vitamin and mineral supplements as recommended by your health care provider. What happens during an annual well check? The services and screenings done by your health care provider during your annual well check will depend on your age, overall health, lifestyle risk factors, and family history of disease. Counseling  Your health care provider may ask you questions about your: Alcohol use. Tobacco use. Drug use. Emotional well-being. Home and relationship well-being. Sexual activity. Eating habits. History of falls. Memory and ability to understand (cognition).  Work and work Astronomer. Reproductive health. Screening  You may have the following tests or measurements: Height, weight, and BMI. Blood pressure. Lipid and cholesterol levels. These may be checked every 5 years, or more frequently if you are over 4 years old. Skin check. Lung cancer screening. You may have this screening every year starting at age 51 if you have a 30-pack-year history of smoking and currently smoke or have quit within the past 15 years. Fecal occult blood test (FOBT) of the stool.  You may have this test every year starting at age 50. Flexible sigmoidoscopy or colonoscopy. You may have a sigmoidoscopy every 5 years or a colonoscopy every 10 years starting at age 59. Hepatitis C blood test. Hepatitis B blood test. Sexually transmitted disease (STD) testing. Diabetes screening. This is done by checking your blood sugar (glucose) after you have not eaten for a while (fasting). You may have this done every 1-3 years. Bone density scan. This is done to screen for osteoporosis. You may have this done starting at age 11. Mammogram. This may be done every 1-2 years. Talk to your health care provider about how often you should have regular mammograms. Talk with your health care provider about your test results, treatment options, and if necessary, the need for more tests. Vaccines  Your health care provider may recommend certain vaccines, such as: Influenza vaccine. This is recommended every year. Tetanus, diphtheria, and acellular pertussis (Tdap, Td) vaccine. You may need a Td booster every 10 years. Zoster vaccine. You may need this after age 30. Pneumococcal 13-valent conjugate (PCV13) vaccine. One dose is recommended after age 31. Pneumococcal polysaccharide (PPSV23) vaccine. One dose is recommended after age 51. Talk to your health care provider about which screenings and vaccines you need and how often you need them. This information is not intended to replace advice given to you by your health care provider. Make sure you discuss any questions you have with your health care provider. Document Released: 03/15/2015 Document Revised: 11/06/2015 Document Reviewed: 12/18/2014 Elsevier Interactive Patient Education  2017 [employer].  Fall Prevention in the Home Falls can cause injuries. They can happen to people of all ages. There are many things you can do to make your home safe and to help prevent falls. What can I do on the outside of my home? Regularly fix the edges of  walkways and driveways and fix any cracks. Remove anything that might make you trip as you walk through a door, such as a raised step or threshold. Trim any bushes or trees on the path to your home. Use bright outdoor lighting. Clear any walking paths of anything that might make someone trip, such as rocks or tools. Regularly check to see if handrails are loose or broken. Make sure that both sides of any steps have handrails. Any raised decks and porches should have guardrails on the edges. Have any leaves, snow, or ice cleared regularly. Use sand or salt on walking paths during winter. Clean up any spills in your garage right away. This includes oil or grease spills. What can I do in the bathroom? Use night lights. Install grab bars by the toilet and in the tub and shower. Do not use towel bars as grab bars. Use non-skid mats or decals in the tub or shower. If you need to sit down in the shower, use a plastic, non-slip stool. Keep the floor dry. Clean up any water that spills on the floor as soon as it happens. Remove  soap buildup in the tub or shower regularly. Attach bath mats securely with double-sided non-slip rug tape. Do not have throw rugs and other things on the floor that can make you trip. What can I do in the bedroom? Use night lights. Make sure that you have a light by your bed that is easy to reach. Do not use any sheets or blankets that are too big for your bed. They should not hang down onto the floor. Have a firm chair that has side arms. You can use this for support while you get dressed. Do not have throw rugs and other things on the floor that can make you trip. What can I do in the kitchen? Clean up any spills right away. Avoid walking on wet floors. Keep items that you use a lot in easy-to-reach places. If you need to reach something above you, use a strong step stool that has a grab bar. Keep electrical cords out of the way. Do not use floor polish or wax that  makes floors slippery. If you must use wax, use non-skid floor wax. Do not have throw rugs and other things on the floor that can make you trip. What can I do with my stairs? Do not leave any items on the stairs. Make sure that there are handrails on both sides of the stairs and use them. Fix handrails that are broken or loose. Make sure that handrails are as long as the stairways. Check any carpeting to make sure that it is firmly attached to the stairs. Fix any carpet that is loose or worn. Avoid having throw rugs at the top or bottom of the stairs. If you do have throw rugs, attach them to the floor with carpet tape. Make sure that you have a light switch at the top of the stairs and the bottom of the stairs. If you do not have them, ask someone to add them for you. What else can I do to help prevent falls? Wear shoes that: Do not have high heels. Have rubber bottoms. Are comfortable and fit you well. Are closed at the toe. Do not wear sandals. If you use a stepladder: Make sure that it is fully opened. Do not climb a closed stepladder. Make sure that both sides of the stepladder are locked into place. Ask someone to hold it for you, if possible. Clearly mark and make sure that you can see: Any grab bars or handrails. First and last steps. Where the edge of each step is. Use tools that help you move around (mobility aids) if they are needed. These include: Canes. Walkers. Scooters. Crutches. Turn on the lights when you go into a dark area. Replace any light bulbs as soon as they burn out. Set up your furniture so you have a clear path. Avoid moving your furniture around. If any of your floors are uneven, fix them. If there are any pets around you, be aware of where they are. Review your medicines with your doctor. Some medicines can make you feel dizzy. This can increase your chance of falling. Ask your doctor what other things that you can do to help prevent falls. This  information is not intended to replace advice given to you by your health care provider. Make sure you discuss any questions you have with your health care provider. Document Released: 12/13/2008 Document Revised: 07/25/2015 Document Reviewed: 03/23/2014 Elsevier Interactive Patient Education  2017 [employer].

## 2022-01-15 ENCOUNTER — Telehealth: Payer: Self-pay | Admitting: Family Medicine

## 2022-01-15 NOTE — Telephone Encounter (Signed)
Vaccine updated

## 2022-01-15 NOTE — Telephone Encounter (Signed)
Patient states she got a Tetanus shot on 01/12/22

## 2022-03-03 ENCOUNTER — Telehealth: Payer: Self-pay | Admitting: Family Medicine

## 2022-03-03 NOTE — Telephone Encounter (Signed)
Noted  

## 2022-03-03 NOTE — Telephone Encounter (Signed)
I'm happy to try to see her BUT then she would need to go to another location for x-rays. May make sense to do either sports medicine, urgent care or ED other than seeing me. If she still wants to come here despite recommendation can work in last visit on Wednesday morning- but there may be a wait as already have full schedule

## 2022-03-03 NOTE — Telephone Encounter (Signed)
FYI, only other option would be calling ems since xray would be needed.

## 2022-03-03 NOTE — Telephone Encounter (Signed)
See below message regarding scheduling.

## 2022-03-03 NOTE — Telephone Encounter (Signed)
Pt's spouse states he believes pt has more fractures & is in severe pain. Pt refuses to go to ED & UC. Pt is unable to walk or sit in a chair. Spouse is asking what he should do due to pt only asking to speak with [facility]. Please advise

## 2022-03-03 NOTE — Telephone Encounter (Signed)
Spouse will call back once they decide to go to ED or come see Dr Yong Channel - Spouse will call office.

## 2022-03-06 ENCOUNTER — Emergency Department (HOSPITAL_COMMUNITY): Payer: 59

## 2022-03-06 ENCOUNTER — Other Ambulatory Visit: Payer: Self-pay

## 2022-03-06 ENCOUNTER — Ambulatory Visit: Payer: Self-pay

## 2022-03-06 ENCOUNTER — Emergency Department (HOSPITAL_COMMUNITY)
Admission: EM | Admit: 2022-03-06 | Discharge: 2022-03-07 | Disposition: A | Discharge status: Home / Self Care | Payer: 59 | Attending: Emergency Medicine | Admitting: Emergency Medicine

## 2022-03-06 ENCOUNTER — Encounter (HOSPITAL_COMMUNITY): Payer: Self-pay

## 2022-03-06 DIAGNOSIS — Z87891 Personal history of nicotine dependence: Secondary | ICD-10-CM | POA: Diagnosis not present

## 2022-03-06 DIAGNOSIS — M546 Pain in thoracic spine: Secondary | ICD-10-CM | POA: Diagnosis not present

## 2022-03-06 DIAGNOSIS — R079 Chest pain, unspecified: Secondary | ICD-10-CM | POA: Diagnosis not present

## 2022-03-06 DIAGNOSIS — M549 Dorsalgia, unspecified: Secondary | ICD-10-CM | POA: Diagnosis not present

## 2022-03-06 DIAGNOSIS — R000 Tachycardia, unspecified: Secondary | ICD-10-CM | POA: Insufficient documentation

## 2022-03-06 DIAGNOSIS — R918 Other nonspecific abnormal finding of lung field: Secondary | ICD-10-CM | POA: Diagnosis not present

## 2022-03-06 DIAGNOSIS — J439 Emphysema, unspecified: Secondary | ICD-10-CM | POA: Diagnosis not present

## 2022-03-06 DIAGNOSIS — Z743 Need for continuous supervision: Secondary | ICD-10-CM | POA: Diagnosis not present

## 2022-03-06 DIAGNOSIS — R0789 Other chest pain: Secondary | ICD-10-CM | POA: Diagnosis not present

## 2022-03-06 DIAGNOSIS — I10 Essential (primary) hypertension: Secondary | ICD-10-CM | POA: Insufficient documentation

## 2022-03-06 DIAGNOSIS — M40204 Unspecified kyphosis, thoracic region: Secondary | ICD-10-CM | POA: Diagnosis not present

## 2022-03-06 LAB — TROPONIN I (HIGH SENSITIVITY)
Troponin I (High Sensitivity): 11 ng/L (ref <18)
Troponin I (High Sensitivity): 13 ng/L (ref <18)

## 2022-03-06 LAB — CBC WITH DIFFERENTIAL/PLATELET
Abs Immature Granulocytes: 0.04 10*3/uL (ref 0.00–0.07)
Basophils Absolute: 0.1 10*3/uL (ref 0.0–0.1)
Basophils Relative: 1 %
Eosinophils Absolute: 0 10*3/uL (ref 0.0–0.5)
Eosinophils Relative: 0 %
HCT: 40.9 % (ref 36.0–46.0)
Hemoglobin: 14 g/dL (ref 12.0–15.0)
Immature Granulocytes: 0 %
Lymphocytes Relative: 17 %
Lymphs Abs: 2.3 10*3/uL (ref 0.7–4.0)
MCH: 32.5 pg (ref 26.0–34.0)
MCHC: 34.2 g/dL (ref 30.0–36.0)
MCV: 94.9 fL (ref 80.0–100.0)
Monocytes Absolute: 1.5 10*3/uL — ABNORMAL HIGH (ref 0.1–1.0)
Monocytes Relative: 11 %
Neutro Abs: 9.5 10*3/uL — ABNORMAL HIGH (ref 1.7–7.7)
Neutrophils Relative %: 71 %
Platelets: 507 10*3/uL — ABNORMAL HIGH (ref 150–400)
RBC: 4.31 MIL/uL (ref 3.87–5.11)
RDW: 11.7 % (ref 11.5–15.5)
WBC: 13.4 10*3/uL — ABNORMAL HIGH (ref 4.0–10.5)
nRBC: 0 % (ref 0.0–0.2)

## 2022-03-06 LAB — COMPREHENSIVE METABOLIC PANEL
ALT: 14 U/L (ref 0–44)
AST: 34 U/L (ref 15–41)
Albumin: 4.4 g/dL (ref 3.5–5.0)
Alkaline Phosphatase: 75 U/L (ref 38–126)
Anion gap: 16 — ABNORMAL HIGH (ref 5–15)
BUN: 23 mg/dL (ref 8–23)
CO2: 27 mmol/L (ref 22–32)
Calcium: 10.8 mg/dL — ABNORMAL HIGH (ref 8.9–10.3)
Chloride: 84 mmol/L — ABNORMAL LOW (ref 98–111)
Creatinine, Ser: 1.54 mg/dL — ABNORMAL HIGH (ref 0.44–1.00)
GFR, Estimated: 35 mL/min — ABNORMAL LOW (ref >60)
Glucose, Bld: 152 mg/dL — ABNORMAL HIGH (ref 70–99)
Potassium: 4.4 mmol/L (ref 3.5–5.1)
Sodium: 127 mmol/L — ABNORMAL LOW (ref 135–145)
Total Bilirubin: 0.5 mg/dL (ref 0.3–1.2)
Total Protein: 8.3 g/dL — ABNORMAL HIGH (ref 6.5–8.1)

## 2022-03-06 MED ORDER — HYDROCODONE-ACETAMINOPHEN 5-325 MG PO TABS
1.0000 | ORAL_TABLET | Freq: Once | ORAL | Status: AC
  Administered 2022-03-06: 1 via ORAL
  Filled 2022-03-06: qty 1

## 2022-03-06 NOTE — Telephone Encounter (Signed)
Chief Complaint: Chest pain Symptoms: intermittent chest pain under breast bone Frequency: since 12/24 Pertinent Negatives: Patient denies  Disposition: [x] ED /[] Urgent Care (no appt availability in office) / [] Appointment(In office/virtual)/ []  [facility]/ [] Home Care/ [] Refused Recommended Disposition /[] [facility] [facility]/ []  Follow-up with PCP Additional Notes: PT called for 10/10 back pain. PT has hx of back issues. Pt then mentioned 10/10 chest pain that comes and goes. PT also has weakness when pain is there. PT will call EMS for transport. Husband will not be able to get her downstairs.  Reason for Disposition  [1] SEVERE back pain (e.g., excruciating, unable to do any normal activities) AND [2] not improved 2 hours after pain medicine  [1] Chest pain (or "angina") comes and goes AND [2] is happening more often (increasing in frequency) or getting worse (increasing in severity)  (Exception: Chest pains that last only a few seconds.)  Answer Assessment - Initial Assessment Questions 1. ONSET: "When did the pain begin?"      December 24th - getting worse 2. LOCATION: "Where does it hurt?" (upper, mid or lower back)     Between shoulder - under breast bone 3. SEVERITY: "How bad is the pain?"  (e.g., Scale 1-10; mild, moderate, or severe)   - MILD (1-3): Doesn't interfere with normal activities.    - MODERATE (4-7): Interferes with normal activities or awakens from sleep.    - SEVERE (8-10): Excruciating pain, unable to do any normal activities.      severe 4. PATTERN: "Is the pain constant?" (e.g., yes, no; constant, intermittent)      Comes and goes 5. RADIATION: "Does the pain shoot into your legs or somewhere else?"     To breast pain 6. CAUSE:  "What do you think is causing the back pain?"      unsure 7. BACK OVERUSE:  "Any recent lifting of heavy objects, strenuous work or exercise?"     no 8. MEDICINES: "What have you taken so far for the pain?" (e.g.,  nothing, acetaminophen, NSAIDS)     Tramodol - tylenol 9. NEUROLOGIC SYMPTOMS: "Do you have any weakness, numbness, or problems with bowel/bladder control?"     Weakness 10. OTHER SYMPTOMS: "Do you have any other symptoms?" (e.g., fever, abdomen pain, burning with urination, blood in urine)       weakness 11. PREGNANCY: "Is there any chance you are pregnant?" "When was your last menstrual period?"  Answer Assessment - Initial Assessment Questions 1. LOCATION: "Where does it hurt?"       Center under breast bone 2. RADIATION: "Does the pain go anywhere else?" (e.g., into neck, jaw, arms, back)     Back to breast bone 3. ONSET: "When did the chest pain begin?" (Minutes, hours or days)      12/24 4. PATTERN: "Does the pain come and go, or has it been constant since it started?"  "Does it get worse with exertion?"      Comes and goes 5. DURATION: "How long does it last" (e.g., seconds, minutes, hours)      6. SEVERITY: "How bad is the pain?"  (e.g., Scale 1-10; mild, moderate, or severe)    - MILD (1-3): doesn't interfere with normal activities     - MODERATE (4-7): interferes with normal activities or awakens from sleep    - SEVERE (8-10): excruciating pain, unable to do any normal activities       severe 7. CARDIAC RISK FACTORS: "Do you have any history of  heart problems or risk factors for heart disease?" (e.g., angina, prior heart attack; diabetes, high blood pressure, high cholesterol, smoker, or strong family history of heart disease)     yes 8. PULMONARY RISK FACTORS: "Do you have any history of lung disease?"  (e.g., blood clots in lung, asthma, emphysema, birth control pills)      9. CAUSE: "What do you think is causing the chest pain?"     unsure 10. OTHER SYMPTOMS: "Do you have any other symptoms?" (e.g., dizziness, nausea, vomiting, sweating, fever, difficulty breathing, cough)       weakness 11. PREGNANCY: "Is there any chance you are pregnant?" "When was your last menstrual  period?"  Protocols used: Back Pain-A-AH, Chest Pain-A-AH

## 2022-03-06 NOTE — ED Triage Notes (Signed)
Pt BIB [facility] from home d/t upper back pain that has been going on the last 11 days, no falls or injuries known to cause it. This pain radiates down to her Lt side & under her breast, feels like, pressure, sharp & burning pain 10/10. Took baby ASA at home from home health nurse, A/Ox4, c/o chest pain that EMS reports to this RN that her home health nurse told her to say so she would be seen faster. 148/80, 110 bpm 86% on RA.

## 2022-03-06 NOTE — ED Provider Triage Note (Signed)
Emergency Medicine Provider Triage Evaluation Note  Sheena [name] , a 77 y.o. female  was evaluated in triage.  Pt complains of left-sided thoracic back pain as well as left-sided rib pain.  Patient states symptoms began on 02/22/2022 when she was cooking moving objects in her kitchen.  She felt like she pulled something.  She states she is taken at home tramadol since then of which has helped some.  Reports pain with taking a deep breath as well as with any movement of her upper extremities.  Denies fever, chills, night sweats, weakness/sensory deficits in upper or lower extremities, abdominal pain, nausea, vomiting.  Review of Systems  Positive: See above Negative:   Physical Exam  BP (!) 162/77 (BP Location: Right Arm)   Pulse (!) 118   Temp 98.3 F (36.8 C) (Oral)   Resp 18   SpO2 98%  Gen:   Awake, no distress   Resp:  Normal effort  MSK:   Moves extremities without difficulty  Other:  Patient with tender palpation paraspinally left greater than right in the thoracic spine region.  No obvious palpable step-off or crepitus appreciated.  No abdominal tenderness.    Medical Decision Making  Medically screening exam initiated at 7:30 PM.  Appropriate orders placed.  Sheena [name] was informed that the remainder of the evaluation will be completed by another provider, this initial triage assessment does not replace that evaluation, and the importance of remaining in the ED until their evaluation is complete.     Sheena [name], [place] 03/06/22 1932

## 2022-03-07 ENCOUNTER — Emergency Department (HOSPITAL_COMMUNITY): Payer: 59

## 2022-03-07 DIAGNOSIS — J439 Emphysema, unspecified: Secondary | ICD-10-CM | POA: Diagnosis not present

## 2022-03-07 DIAGNOSIS — M546 Pain in thoracic spine: Secondary | ICD-10-CM | POA: Diagnosis not present

## 2022-03-07 DIAGNOSIS — R918 Other nonspecific abnormal finding of lung field: Secondary | ICD-10-CM | POA: Diagnosis not present

## 2022-03-07 DIAGNOSIS — M40204 Unspecified kyphosis, thoracic region: Secondary | ICD-10-CM | POA: Diagnosis not present

## 2022-03-07 MED ORDER — OXYCODONE HCL 5 MG PO TABS
5.0000 mg | ORAL_TABLET | Freq: Once | ORAL | Status: AC
  Administered 2022-03-07: 5 mg via ORAL
  Filled 2022-03-07: qty 1

## 2022-03-07 MED ORDER — LIDOCAINE 5 % EX PTCH: 1.0000 | MEDICATED_PATCH | CUTANEOUS | 0 refills | Status: DC

## 2022-03-07 MED ORDER — OXYCODONE HCL 5 MG PO TABS: 5.0000 mg | ORAL_TABLET | ORAL | 0 refills | Status: DC | PRN

## 2022-03-07 MED ORDER — LACTATED RINGERS IV BOLUS
1000.0000 mL | Freq: Once | INTRAVENOUS | Status: AC
  Administered 2022-03-07: 1000 mL via INTRAVENOUS

## 2022-03-07 MED ORDER — IOHEXOL 350 MG/ML SOLN
50.0000 mL | Freq: Once | INTRAVENOUS | Status: AC | PRN
  Administered 2022-03-07: 50 mL via INTRAVENOUS

## 2022-03-07 MED ORDER — HYDROMORPHONE HCL 1 MG/ML IJ SOLN
0.5000 mg | Freq: Once | INTRAMUSCULAR | Status: AC
  Administered 2022-03-07: 0.5 mg via INTRAVENOUS
  Filled 2022-03-07: qty 1

## 2022-03-07 NOTE — Discharge Instructions (Addendum)
You were evaluated in the Emergency Department and after careful evaluation, we did not find any emergent condition requiring admission or further testing in the hospital.  Your exam/testing today was overall reassuring.  Symptoms like we due to a musculoskeletal cause.  Your heart and lungs were tested thoroughly and everything looked good.  We did discuss your pulmonary nodule and recommend close follow-up with your primary care doctor for further testing  You seemed a bit dehydrated and your sodium levels were a bit low, be sure to have a normal diet and increase your salt intake a bit.  Please return to the Emergency Department if you experience any worsening of your condition.  Thank you for allowing [place] to be a part of your care.

## 2022-03-07 NOTE — ED Notes (Signed)
Pt returned from ct
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
## 2022-03-10 ENCOUNTER — Telehealth: Payer: Self-pay

## 2022-03-10 NOTE — Patient Outreach (Signed)
  Care Coordination TOC Note Transition Care Management Follow-up Telephone Call Date of discharge and from where: 03/07/22-[place]   Dx: "acute left sided thoracic back pain" Red on EMMI-ED Discharge Alert Reason: "Scheduled follow-up appt? No" Red Alert Date: 03/08/22 How have you been since you were released from the hospital? Incoming call form spouse returning RN CM. Spoke with both patient and spouse via speaker phone. They report that yesterday she "felt real weak but today feels better." She has been able to get up and ambulate to bathroom with assist. Spouse reports patient taking Tylenol and Ultram for pain mgmt. She did not like the way Oxycodone and Flexeril made her feel. He is also applying Lidocaine patches as ordered and heating pad. Appetite has been decreased. Patient drinking at least one Ensure Plus per day. They are unsure of LBM. Spouse will give patient stool/softener//laxative today.  Any questions or concerns? No  Items Reviewed: Did the pt receive and understand the discharge instructions provided? Yes  Medications obtained and verified? Yes  Other? Yes -pain mgmt, bowel regimen Any new allergies since your discharge? No  Dietary orders reviewed? Yes Do you have support at home? Yes -spouse  Home Care and Equipment/Supplies: Were home health services ordered? not applicable If so, what is the name of the agency? N/A  Has the agency set up a time to come to the patient's home? not applicable Were any new equipment or medical supplies ordered?  No What is the name of the medical supply agency? N/A Were you able to get the supplies/equipment? not applicable Do you have any questions related to the use of the equipment or supplies? No  Functional Questionnaire: (I = Independent and D = Dependent) ADLs: A  Bathing/Dressing- A  Meal Prep- A  Eating- I  Maintaining continence- I  Transferring/Ambulation- A  Managing Meds- A  Follow up appointments  reviewed:  PCP Hospital f/u appt confirmed?  Spouse prefers to wait until patient is physically feeling better and able to move better before making an appt  . [facility] f/u appt confirmed?  N/A  . Are transportation arrangements needed? No  If their condition worsens, is the pt aware to call PCP or go to the Emergency Dept.? Yes Was the patient provided with contact information for the PCP's office or ED? Yes Was to pt encouraged to call back with questions or concerns? Yes  SDOH assessments and interventions completed:   Yes SDOH Interventions Today    Flowsheet Row Most Recent Value  SDOH Interventions   Food Insecurity Interventions Intervention Not Indicated  Transportation Interventions Intervention Not Indicated       Care Coordination Interventions:  Education provided    Encounter Outcome:  Pt. Visit Completed    Enzo Montgomery, RN,BSN,CCM [place] Management Telephonic Care Management Coordinator Direct Phone: [phone] Toll Free: [phone] Fax: [phone]

## 2022-03-10 NOTE — Patient Outreach (Signed)
  Care Coordination TOC Note Transition Care Management Unsuccessful Follow-up Telephone Call  Date of discharge and from where:  03/07/22-[facility]  Attempts:  1st Attempt  Reason for unsuccessful TCM follow-up call:  Left voice message     Demoni Gergen, RN,BSN,CCM [facility] Care Management Telephonic Care Management Coordinator Direct Phone: [phone] Toll Free: [phone] Fax: [phone]   

## 2022-03-16 ENCOUNTER — Telehealth: Payer: Self-pay | Admitting: Family Medicine

## 2022-03-16 NOTE — Telephone Encounter (Signed)
Patient's spouse states they are both experiencing flu like symptoms. Refusing to come into office stating Patient is unable to.  Requests medication be called in for both.

## 2022-03-16 NOTE — Telephone Encounter (Signed)
Needs office visit.

## 2022-03-17 NOTE — Telephone Encounter (Signed)
Informed pt of PCP message.   Patient states: -Is not able to sit in [place] and has trouble getting into the [facility]  - She went to ED on 01/05 and only got there via ambulance - She will need a ED follow up   I recommended to patient that in terms of getting into the vehicle she could try getting a lyft or an uber that has a more accessible vehicle. In terms of the visit, informed her it needs to be in person. Pt verbalized understanding and stated would call back.

## 2022-03-17 NOTE — Telephone Encounter (Signed)
Noted  

## 2022-03-17 NOTE — Telephone Encounter (Signed)
Please call and schedule for OV.

## 2022-03-19 ENCOUNTER — Inpatient Hospital Stay: Payer: 59 | Admitting: Family Medicine

## 2022-03-20 ENCOUNTER — Telehealth (INDEPENDENT_AMBULATORY_CARE_PROVIDER_SITE_OTHER): Payer: 59 | Admitting: Family

## 2022-03-20 ENCOUNTER — Encounter: Payer: Self-pay | Admitting: Family

## 2022-03-20 VITALS — Ht 60.0 in | Wt 112.0 lb

## 2022-03-20 DIAGNOSIS — J321 Chronic frontal sinusitis: Secondary | ICD-10-CM

## 2022-03-20 DIAGNOSIS — J069 Acute upper respiratory infection, unspecified: Secondary | ICD-10-CM

## 2022-03-20 MED ORDER — PREDNISONE 20 MG PO TABS: ORAL_TABLET | ORAL | 0 refills | Status: DC

## 2022-03-20 NOTE — Progress Notes (Signed)
Virtual telephone visit    Virtual Visit via Telephone Note   This visit type was conducted due to national recommendations for restrictions regarding the COVID-19 Pandemic (e.g. social distancing) in an effort to limit this patient's exposure and mitigate transmission in our community. Due to her co-morbid illnesses, this patient is at least at moderate risk for complications without adequate follow up. This format is felt to be most appropriate for this patient at this time. The patient did not have access to video technology or had technical difficulties with video requiring transitioning to audio format only (telephone). Physical exam was limited to content and character of the telephone converstion. CMA was able to get the patient set up on a telephone visit.   Patient location: Home. Patient and provider in visit Provider location: Office  I discussed the limitations of evaluation and management by telemedicine and the availability of in person appointments. The patient expressed understanding and agreed to proceed.   Visit Date: 03/20/2022  Today's healthcare provider: Jeanie Sewer, NP     Subjective:    Patient ID: Sheena [name], female    DOB: 04/24/1945, 77 y.o.   MRN: [id number]  Chief Complaint  Patient presents with   Sinus Problem    sx for >3w   HPI URI sx:   Pt c/o thick creamy mucus, Nasal congestion, Headaches, sinus pressure, fatigue and dehydration. Denies fever, productive cough, clear phlegm. Present for 3 weeks. Has tried tylenol and tramadol as needed. Covid negative today at home.   Past Surgical History:  Procedure Laterality Date   KYPHOPLASTY     2014   peridontal surgery      Outpatient Medications Prior to Visit  Medication Sig Dispense Refill   acetaminophen (TYLENOL) 500 MG tablet Take 500 mg by mouth 2 (two) times daily as needed for mild pain. Reported on 06/04/2015     amLODipine (NORVASC) 5 MG tablet Take 1 tablet (5 mg total)  by mouth daily. 90 tablet 3   aspirin EC 81 MG tablet Take 81 mg by mouth 2 (two) times a week.     atorvastatin (LIPITOR) 20 MG tablet TAKE 1 TABLET BY MOUTH EVERY DAY 90 tablet 3   cyclobenzaprine (FLEXERIL) 5 MG tablet TAKE 1 TABLET BY MOUTH TWICE A DAY AS NEEDED FOR MUSCLE SPASMS 30 tablet 1   furosemide (LASIX) 40 MG tablet TAKE 1 TABLET BY MOUTH EVERY DAY 90 tablet 1   lidocaine (LIDODERM) 5 % Place 1 patch onto the skin daily. Remove & Discard patch within 12 hours or as directed by MD 5 patch 0   metoprolol tartrate (LOPRESSOR) 50 MG tablet TAKE 1 TABLET BY MOUTH TWICE A DAY 180 tablet 1   oxyCODONE (ROXICODONE) 5 MG immediate release tablet Take 1 tablet (5 mg total) by mouth every 4 (four) hours as needed for severe pain. 8 tablet 0   traMADol (ULTRAM) 50 MG tablet Take 0.5-1 tablets (25-50 mg total) by mouth 2 (two) times daily as needed. For chronic low back pain. May refill monthly 60 tablet 2   No facility-administered medications prior to visit.    Allergies  Allergen Reactions   Antihistamines, Diphenhydramine-Type Other (See Comments)    REACTION: palpitations, feel jittery   Aspirin Other (See Comments)    REACTION: Stomach cramps   Evista [Raloxifene]     sweats      Objective:     Ht 5' (1.524 m)   Wt 112 lb (50.8 kg)  BMI 21.87 kg/m   Wt Readings from Last 3 Encounters:  03/20/22 112 lb (50.8 kg)  01/09/22 112 lb (50.8 kg)  01/01/22 112 lb 9.6 oz (51.1 kg)       Assessment & Plan:   Problem List Items Addressed This Visit   None Visit Diagnoses     Viral upper respiratory tract infection    -  Primary sending prednisone x5d, advised on use & SE, also can try OTC generic Mucinex, steroid nasal spray qd, saline nasal spray tid & before medicated sprays. Humidifier overnight.    Relevant Medications   predniSONE (DELTASONE) 20 MG tablet     Meds ordered this encounter  Medications   predniSONE (DELTASONE) 20 MG tablet    Sig: Take 2 pills in  the morning with breakfast for 3 days, then 1 pill for 2 days    Dispense:  8 tablet    Refill:  0    Order Specific Question:   Supervising Provider    Answer:   ANDY, CAMILLE L [[id number]]    I discussed the assessment and treatment plan with the patient. The patient was provided an opportunity to ask questions and all were answered. The patient agreed with the plan and demonstrated an understanding of the instructions.   The patient was advised to call back or seek an in-person evaluation if the symptoms worsen or if the condition fails to improve as anticipated.  I provided 20 minutes of non-face-to-face time during this encounter.   Jeanie Sewer, NP [place] [phone] (phone) [phone] (fax)  [place]

## 2022-04-02 ENCOUNTER — Inpatient Hospital Stay: Payer: 59 | Admitting: Family Medicine

## 2022-04-17 ENCOUNTER — Other Ambulatory Visit: Payer: Self-pay | Admitting: Family Medicine

## 2022-05-07 ENCOUNTER — Ambulatory Visit (INDEPENDENT_AMBULATORY_CARE_PROVIDER_SITE_OTHER): Payer: 59 | Admitting: Family Medicine

## 2022-05-07 ENCOUNTER — Encounter: Payer: Self-pay | Admitting: Family Medicine

## 2022-05-07 VITALS — BP 124/70 | HR 76 | Temp 97.5°F | Ht 60.0 in | Wt 105.4 lb

## 2022-05-07 DIAGNOSIS — G8929 Other chronic pain: Secondary | ICD-10-CM | POA: Diagnosis not present

## 2022-05-07 DIAGNOSIS — R911 Solitary pulmonary nodule: Secondary | ICD-10-CM

## 2022-05-07 DIAGNOSIS — I10 Essential (primary) hypertension: Secondary | ICD-10-CM

## 2022-05-07 DIAGNOSIS — M545 Low back pain, unspecified: Secondary | ICD-10-CM | POA: Diagnosis not present

## 2022-05-07 DIAGNOSIS — I700 Atherosclerosis of aorta: Secondary | ICD-10-CM

## 2022-05-07 DIAGNOSIS — E785 Hyperlipidemia, unspecified: Secondary | ICD-10-CM | POA: Diagnosis not present

## 2022-05-07 LAB — LIPID PANEL
Cholesterol: 164 mg/dL (ref 0–200)
HDL: 62.8 mg/dL (ref >39.00)
LDL Cholesterol: 78 mg/dL (ref 0–99)
NonHDL: 101.53
Total CHOL/HDL Ratio: 3
Triglycerides: 117 mg/dL (ref 0.0–149.0)
VLDL: 23.4 mg/dL (ref 0.0–40.0)

## 2022-05-07 LAB — CBC WITH DIFFERENTIAL/PLATELET
Basophils Absolute: 0.1 10*3/uL (ref 0.0–0.1)
Basophils Relative: 0.9 % (ref 0.0–3.0)
Eosinophils Absolute: 0.3 10*3/uL (ref 0.0–0.7)
Eosinophils Relative: 2.7 % (ref 0.0–5.0)
HCT: 38.3 % (ref 36.0–46.0)
Hemoglobin: 12.6 g/dL (ref 12.0–15.0)
Lymphocytes Relative: 24.9 % (ref 12.0–46.0)
Lymphs Abs: 2.5 10*3/uL (ref 0.7–4.0)
MCHC: 32.9 g/dL (ref 30.0–36.0)
MCV: 97.7 fl (ref 78.0–100.0)
Monocytes Absolute: 0.9 10*3/uL (ref 0.1–1.0)
Monocytes Relative: 9.4 % (ref 3.0–12.0)
Neutro Abs: 6.2 10*3/uL (ref 1.4–7.7)
Neutrophils Relative %: 62.1 % (ref 43.0–77.0)
Platelets: 338 10*3/uL (ref 150.0–400.0)
RBC: 3.92 Mil/uL (ref 3.87–5.11)
RDW: 14.1 % (ref 11.5–15.5)
WBC: 10 10*3/uL (ref 4.0–10.5)

## 2022-05-07 LAB — COMPREHENSIVE METABOLIC PANEL
ALT: 9 U/L (ref 0–35)
AST: 19 U/L (ref 0–37)
Albumin: 4.3 g/dL (ref 3.5–5.2)
Alkaline Phosphatase: 63 U/L (ref 39–117)
BUN: 12 mg/dL (ref 6–23)
CO2: 28 mEq/L (ref 19–32)
Calcium: 10.5 mg/dL (ref 8.4–10.5)
Chloride: 99 mEq/L (ref 96–112)
Creatinine, Ser: 1.1 mg/dL (ref 0.40–1.20)
GFR: 49.39 mL/min — ABNORMAL LOW (ref >60.00)
Glucose, Bld: 106 mg/dL — ABNORMAL HIGH (ref 70–99)
Potassium: 4.2 mEq/L (ref 3.5–5.1)
Sodium: 137 mEq/L (ref 135–145)
Total Bilirubin: 0.5 mg/dL (ref 0.2–1.2)
Total Protein: 7.3 g/dL (ref 6.0–8.3)

## 2022-05-07 MED ORDER — TRAMADOL HCL 50 MG PO TABS: 25.0000 mg | ORAL_TABLET | Freq: Two times a day (BID) | ORAL | 2 refills | Status: DC | PRN

## 2022-05-07 MED ORDER — CYCLOBENZAPRINE HCL 5 MG PO TABS: ORAL_TABLET | ORAL | 1 refills | Status: DC

## 2022-05-07 NOTE — Progress Notes (Signed)
Phone [phone] In person visit   Subjective:   Sheena [name] is a 77 y.o. year old very pleasant female patient who presents for/with See problem oriented charting Chief Complaint  Patient presents with   Follow-up   Back Pain    Pt states her back is still hurting very bad.    Past Medical History-  Patient Active Problem List   Diagnosis Date Noted   Grade II diastolic dysfunction [id number]    Priority: High   Former smoker 02/19/2014    Priority: High   Disequilibrium 02/19/2014    Priority: High   Aortic atherosclerosis ([place]) 01/18/2020    Priority: Medium    Hyperlipidemia 03/05/2014    Priority: Medium    Chronic low back pain 12/13/2012    Priority: Medium    Essential hypertension, benign 09/03/2012    Priority: Medium    Ataxia 04/01/2011    Priority: Medium    Osteoporosis 03/21/2010    Priority: Medium    MURMUR 03/21/2010    Priority: Medium    Hyperglycemia 03/21/2010    Priority: Medium    History of vertebral compression fracture 01/04/2013    Priority: Low   Sinusitis, chronic 09/03/2012    Priority: Low   Anxiety 04/01/2011    Priority: Low   Blurred vision 03/21/2010    Priority: Low    Medications- reviewed and updated Current Outpatient Medications  Medication Sig Dispense Refill   acetaminophen (TYLENOL) 500 MG tablet Take 500 mg by mouth 2 (two) times daily as needed for mild pain. Reported on 06/04/2015     amLODipine (NORVASC) 5 MG tablet Take 1 tablet (5 mg total) by mouth daily. 90 tablet 3   aspirin EC 81 MG tablet Take 81 mg by mouth 2 (two) times a week.     atorvastatin (LIPITOR) 20 MG tablet TAKE 1 TABLET BY MOUTH EVERY DAY 90 tablet 3   furosemide (LASIX) 40 MG tablet TAKE 1 TABLET BY MOUTH EVERY DAY 90 tablet 1   lidocaine (LIDODERM) 5 % Place 1 patch onto the skin daily. Remove & Discard patch within 12 hours or as directed by MD 5 patch 0   metoprolol tartrate (LOPRESSOR) 50 MG tablet TAKE 1 TABLET BY MOUTH TWICE A DAY  180 tablet 1   cyclobenzaprine (FLEXERIL) 5 MG tablet TAKE 1 TABLET BY MOUTH TWICE A DAY AS NEEDED FOR MUSCLE SPASMS 30 tablet 1   traMADol (ULTRAM) 50 MG tablet Take 0.5-1 tablets (25-50 mg total) by mouth 2 (two) times daily as needed. For chronic low back pain. May refill monthly 60 tablet 2   No current facility-administered medications for this visit.     Objective:  BP 124/70   Pulse 76   Temp (!) 97.5 F (36.4 C)   Ht 5' (1.524 m)   Wt 105 lb 6.4 oz (47.8 kg)   SpO2 96%   BMI 20.58 kg/m  Gen: NAD, resting comfortably CV: RRR no murmurs rubs or gallops Lungs: CTAB no crackles, wheeze, rhonchi Ext: no edema Skin: warm, dry    Assessment and Plan   # pulmonary nodule S:from Ct 03/07/22 "Left lower lobe pulmonary nodule measuring up to 1.6 cm. Consider one of the following in 3 months for both low-risk and high-risk individuals: (a) repeat chest CT, (b) follow-up PET-CT, or (c) tissue sampling."  A/P: we discussed these options as well as option of seeing Dr. Valeta Harms for his opinion as her husband has established relationship with him- since they had  a great experience before we opted to start with Dr. Valeta Harms- referral was placed today  -had previously declined lung cancer screening program- is a former long term smoker  #hypertension S: medication:  Lasix '40mg'$ , Metoprolol '50mg'$  twice daily, Amlodipine 5 mg  BP Readings from Last 3 Encounters:  05/07/22 124/70  03/07/22 (!) 140/59  01/01/22 122/64  A/P: stable- continue current medicines   #hyperlipidemia/aortic atherosclerosis S: Medication: atorvastatin '20mg'$  daily  Lab Results  Component Value Date   CHOL 144 02/11/2021   HDL 63.40 02/11/2021   LDLCALC 55 02/11/2021   LDLDIRECT 189.3 03/05/2014   TRIG 132.0 02/11/2021   CHOLHDL 2 02/11/2021  A/P: lipids at goal last check- continue current medications and update labs Aortic atherosclerosis (presumed stable)- LDL goal ideally <70 - update lipids and continue current  medications   # Osteoporosis S:medication: took at least 5 years fosamax 2017 to early 2023- Takes calcium/vitamin D.   Last bone density: improved 03/18/21 on fosamax- before stopping A/P: discussed option restarting med with fractures noted- she declines- wants to monitor without meds and repeat 2025    # disequilibrium/ataxia S: ongoing issues at least to 2012. CT brain 10/13/10.  Has improved slightly over time and some days with no issues   A/P: overall stable recently- continue current medications    # Chronic low back pain S: kyphoplasty dec 2014 In t12.  Takes two tylenol at 1 PM and 9 30 pm. Also uses very sparing tramadol half  or whole tablet. - last rx may 2023 and she states still has pills. Flexeril helpful as well. Often gets away with tylenol which is her preference -did have compression fractures noted 03/07/22 of t5 and t11 and chronic t8 50% reduction. She was given oxycodone short term supply -denies falls in last 6 months A/P: ongoing pain- but overall stable- refill tramadol and flexeril  Recommended follow up: Return in about 6 months (around 11/07/2022) for followup or sooner if needed.Schedule b4 you leave.  Will cancel visit below Future Appointments  Date Time Provider [place]  06/17/2022 10:20 AM Marin Olp, MD [facility]    Lab/Order associations:   ICD-10-CM   1. Lung nodule  R91.1 Ambulatory referral to Pulmonology    2. Hyperlipidemia, unspecified hyperlipidemia type  E78.5 CBC with Differential/Platelet    Comprehensive metabolic panel    Lipid panel    3. Aortic atherosclerosis (HCC) Chronic I70.0     4. Chronic bilateral low back pain without sciatica  M54.50    G89.29     5. Essential hypertension, benign  I10       Meds ordered this encounter  Medications   traMADol (ULTRAM) 50 MG tablet    Sig: Take 0.5-1 tablets (25-50 mg total) by mouth 2 (two) times daily as needed. For chronic low back pain. May refill monthly     Dispense:  60 tablet    Refill:  2   cyclobenzaprine (FLEXERIL) 5 MG tablet    Sig: TAKE 1 TABLET BY MOUTH TWICE A DAY AS NEEDED FOR MUSCLE SPASMS    Dispense:  30 tablet    Refill:  1    Return precautions advised.  Garret Reddish, MD

## 2022-05-07 NOTE — Patient Instructions (Addendum)
We will either call you (or see alternate below) within two weeks about your referral to Dr. Valeta Harms . Our referral specialist will sometimes also send you a mychart link once referral is approved and then you will call the # listed on there (let us know if you do not see this within 2 weeks or have not received call)  Please stop by lab before you go If you have mychart- we will send your results within 3 business days of [place] receiving them.  If you do not have mychart- we will call you about results within 5 business days of [place] receiving them.  *please also note that you will see labs on mychart as soon as they post. I will later go in and write notes on them- will say "notes from Dr. Yong Channel"    Recommended follow up: Return in about 6 months (around 11/07/2022) for followup or sooner if needed.Schedule b4 you leave. -cancel your visit on April 17th on the way out

## 2022-05-21 ENCOUNTER — Ambulatory Visit (INDEPENDENT_AMBULATORY_CARE_PROVIDER_SITE_OTHER): Payer: 59 | Admitting: Pulmonary Disease

## 2022-05-21 ENCOUNTER — Encounter: Payer: Self-pay | Admitting: Pulmonary Disease

## 2022-05-21 VITALS — BP 140/80 | HR 129 | Ht 60.0 in | Wt 106.0 lb

## 2022-05-21 DIAGNOSIS — Z87891 Personal history of nicotine dependence: Secondary | ICD-10-CM | POA: Diagnosis not present

## 2022-05-21 DIAGNOSIS — R911 Solitary pulmonary nodule: Secondary | ICD-10-CM | POA: Diagnosis not present

## 2022-05-21 NOTE — Patient Instructions (Signed)
Thank you for visiting Dr. Valeta Harms at [facility]. Today we recommend the following:  Orders Placed This Encounter  Procedures   NM PET Image Initial (PI) Skull Base To Thigh (F-18 FDG)   CT Super D Chest Wo Contrast   Appt after your imaging is complete   Return in about 3 weeks (around 06/11/2022) for with Eric Form, NP, or Dr. Valeta Harms, after PET/CT Chest.    Please do your part to reduce the spread of COVID-19.

## 2022-05-21 NOTE — Progress Notes (Signed)
Synopsis: Referred in March 2024 for pulmonary nodule by Marin Olp, MD  Subjective:   PATIENT ID: Sheena [name] GENDER: female DOB: [birth year], MRN: [id number]  Chief Complaint  Patient presents with   Consult    Lung nodule.    This is a 77 year old female, past medical history of hypertension.  Longstanding history of tobacco use.  She is a former smoker she smoked for 45 years.  She also has a history of chronic back pain and history of kyphoplasty.  CT imaging of the chest complete in January revealed a 1.6 cm left lower lobe pulmonary nodule.  Her husband is also a previous cancer patient of mine.  Patient was referred for evaluation of pulmonary nodule due to concern of underlying malignancy.    Past Medical History:  Diagnosis Date   Anxiety    Heart murmur    Hypertension    Scarlet fever with other complications childhood   "had to learn to work again" has had leg weakness     Family History  Problem Relation Age of Onset   Cancer Mother        breast, spine mets   Hypertension Mother    Heart disease Mother 42       CABG 4 vessel   COPD Father        emphysema   Heart disease Brother 50       MI     Past Surgical History:  Procedure Laterality Date   KYPHOPLASTY     06/05/2012   peridontal surgery      Social History   Socioeconomic History   Marital status: Married    Spouse name: Not on file   Number of children: 4   Years of education: 10   Highest education level: Not on file  Occupational History   Occupation: disability  Tobacco Use   Smoking status: Former    Packs/day: 1.00    Years: 45.00    Additional pack years: 0.00    Total pack years: 45.00    Types: Cigarettes   Smokeless tobacco: Never  Substance and Sexual Activity   Alcohol use: No   Drug use: No   Sexual activity: Not Currently  Other Topics Concern   Not on file  Social History Narrative    Divorced (Married - 06/06/66 -32yr/divorced; remarried 06-05-1980) but lives with  husband (2nd marriage)-had 1 child together that died in [place]. 3 son 06/06/2067 (died MVC), '73, '84 (died MVC); 1 dtr 06/05/2069. 3 grandchildren. 1 greatgrandchildren.  Lives with husband.       10th grade. Quit school to work and help raise nieces. 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   Social Determinants of Health   Financial Resource Strain: Low Risk  (01/09/2022)   Overall Financial Resource Strain (CARDIA)    Difficulty of Paying Living Expenses: Not hard at all  Food Insecurity: No Food Insecurity (03/10/2022)   Hunger Vital Sign    Worried About Running Out of Food in the Last Year: Never true    Ran Out of Food in the Last Year: Never true  Transportation Needs: No Transportation Needs (03/10/2022)   PRAPARE - Hydrologist (Medical): No    Lack of Transportation (Non-Medical): No  Physical Activity: Inactive (01/09/2022)   Exercise Vital Sign    Days of Exercise per Week: 0 days    Minutes of Exercise per Session: 0 min  Stress: No Stress Concern Present (01/09/2022)  [employer] - Occupational Stress Questionnaire    Feeling of Stress : Not at all  Social Connections: Moderately Isolated (01/09/2022)   Social Connection and Isolation Panel [NHANES]    Frequency of Communication with Friends and Family: More than three times a week    Frequency of Social Gatherings with Friends and Family: Once a week    Attends Religious Services: Never    Marine scientist or Organizations: No    Attends Archivist Meetings: Never    Marital Status: Married  Human resources officer Violence: Not At Risk (01/09/2022)   Humiliation, Afraid, Rape, and Kick questionnaire    Fear of Current or Ex-Partner: No    Emotionally Abused: No    Physically Abused: No    Sexually Abused: No     Allergies  Allergen Reactions   Antihistamines, Diphenhydramine-Type Other (See Comments)    REACTION: palpitations, feel jittery   Aspirin Other (See  Comments)    REACTION: Stomach cramps   Evista [Raloxifene]     sweats     Outpatient Medications Prior to Visit  Medication Sig Dispense Refill   acetaminophen (TYLENOL) 500 MG tablet Take 500 mg by mouth 2 (two) times daily as needed for mild pain. Reported on 06/04/2015     amLODipine (NORVASC) 5 MG tablet Take 1 tablet (5 mg total) by mouth daily. 90 tablet 3   aspirin EC 81 MG tablet Take 81 mg by mouth 2 (two) times a week.     atorvastatin (LIPITOR) 20 MG tablet TAKE 1 TABLET BY MOUTH EVERY DAY 90 tablet 3   cyclobenzaprine (FLEXERIL) 5 MG tablet TAKE 1 TABLET BY MOUTH TWICE A DAY AS NEEDED FOR MUSCLE SPASMS 30 tablet 1   furosemide (LASIX) 40 MG tablet TAKE 1 TABLET BY MOUTH EVERY DAY 90 tablet 1   lidocaine (LIDODERM) 5 % Place 1 patch onto the skin daily. Remove & Discard patch within 12 hours or as directed by MD 5 patch 0   metoprolol tartrate (LOPRESSOR) 50 MG tablet TAKE 1 TABLET BY MOUTH TWICE A DAY 180 tablet 1   traMADol (ULTRAM) 50 MG tablet Take 0.5-1 tablets (25-50 mg total) by mouth 2 (two) times daily as needed. For chronic low back pain. May refill monthly 60 tablet 2   No facility-administered medications prior to visit.    Review of Systems  Constitutional:  Negative for chills, fever, malaise/fatigue and weight loss.  HENT:  Negative for hearing loss, sore throat and tinnitus.   Eyes:  Negative for blurred vision and double vision.  Respiratory:  Negative for cough, hemoptysis, sputum production, shortness of breath, wheezing and stridor.   Cardiovascular:  Negative for chest pain, palpitations, orthopnea, leg swelling and PND.  Gastrointestinal:  Negative for abdominal pain, constipation, diarrhea, heartburn, nausea and vomiting.  Genitourinary:  Negative for dysuria, hematuria and urgency.  Musculoskeletal:  Positive for back pain. Negative for joint pain and myalgias.  Skin:  Negative for itching and rash.  Neurological:  Negative for dizziness, tingling,  weakness and headaches.  Endo/Heme/Allergies:  Negative for environmental allergies. Does not bruise/bleed easily.  Psychiatric/Behavioral:  Negative for depression. The patient is not nervous/anxious and does not have insomnia.   All other systems reviewed and are negative.    Objective:  Physical Exam Vitals reviewed.  Constitutional:      General: She is not in acute distress.    Appearance: She is well-developed.  HENT:     Head: Normocephalic  and atraumatic.  Eyes:     General: No scleral icterus.    Conjunctiva/sclera: Conjunctivae normal.     Pupils: Pupils are equal, round, and reactive to light.  Neck:     Vascular: No JVD.     Trachea: No tracheal deviation.  Cardiovascular:     Rate and Rhythm: Normal rate and regular rhythm.     Heart sounds: No murmur heard. Pulmonary:     Effort: Pulmonary effort is normal. No tachypnea, accessory muscle usage or respiratory distress.     Breath sounds: No stridor. No wheezing, rhonchi or rales.  Abdominal:     General: Bowel sounds are normal. There is no distension.     Palpations: Abdomen is soft.     Tenderness: There is no abdominal tenderness.  Musculoskeletal:        General: No tenderness.     Cervical back: Neck supple.     Comments: Kyphosis  Lymphadenopathy:     Cervical: No cervical adenopathy.  Skin:    General: Skin is warm and dry.     Capillary Refill: Capillary refill takes less than 2 seconds.     Findings: No rash.  Neurological:     Mental Status: She is alert and oriented to person, place, and time.  Psychiatric:        Behavior: Behavior normal.      Vitals:   05/21/22 1424  BP: (!) 140/80  Pulse: (!) 129  SpO2: 96%  Weight: 106 lb (48.1 kg)  Height: 5' (1.524 m)   96% on RA BMI Readings from Last 3 Encounters:  05/21/22 20.70 kg/m  05/07/22 20.58 kg/m  03/20/22 21.87 kg/m   Wt Readings from Last 3 Encounters:  05/21/22 106 lb (48.1 kg)  05/07/22 105 lb 6.4 oz (47.8 kg)   03/20/22 112 lb (50.8 kg)     CBC    Component Value Date/Time   WBC 10.0 05/07/2022 1357   RBC 3.92 05/07/2022 1357   HGB 12.6 05/07/2022 1357   HCT 38.3 05/07/2022 1357   PLT 338.0 05/07/2022 1357   MCV 97.7 05/07/2022 1357   MCH 32.5 03/06/2022 1941   MCHC 32.9 05/07/2022 1357   RDW 14.1 05/07/2022 1357   LYMPHSABS 2.5 05/07/2022 1357   MONOABS 0.9 05/07/2022 1357   EOSABS 0.3 05/07/2022 1357   BASOSABS 0.1 05/07/2022 1357      Chest Imaging:  03/07/2022 CT chest: Lobulated 1.6 mm pulmonary nodule within the left lower lobe concerning for malignancy. The patient's images have been independently reviewed by me.    Pulmonary Functions Testing Results:     No data to display          FeNO:   Pathology:   Echocardiogram:   Heart Catheterization:     Assessment & Plan:     ICD-10-CM   1. Lung nodule  R91.1 NM PET Image Initial (PI) Skull Base To Thigh (F-18 FDG)    CT Super D Chest Wo Contrast    2. Former smoker  Z87.891       Discussion:  This is a 77 year old female, new found lung nodule in the left lower lobe lobulated margins, former smoker, history of tobacco abuse.  Plan: Patient's lesion size and morphology is concerning for malignancy. Today in the office we talked about the various neck steps. She is very aware of these as her husband has been through them. We will plan for nuclear medicine pet imaging as well as repeat super D CT  imaging as we would plan for navigational bronchoscopy. What about the risk of robotic assisted navigational bronchoscopy and tissue sampling. Would like to have the PET scan and CT done first before considerations for bronchoscopy. She is can see [place] back after those images are complete and then we will schedule her bronchoscopy. Based on her history of chronic back pain and current mobility status she is not interested in pursuing surgery.  She would be interested in pursuing radiation treatments if in fact the  lesion is malignant. We will get her PET completed and we will see her in the office as soon as that done so we could schedule potential need for bronchoscopy.    Current Outpatient Medications:    acetaminophen (TYLENOL) 500 MG tablet, Take 500 mg by mouth 2 (two) times daily as needed for mild pain. Reported on 06/04/2015, Disp: , Rfl:    amLODipine (NORVASC) 5 MG tablet, Take 1 tablet (5 mg total) by mouth daily., Disp: 90 tablet, Rfl: 3   aspirin EC 81 MG tablet, Take 81 mg by mouth 2 (two) times a week., Disp: , Rfl:    atorvastatin (LIPITOR) 20 MG tablet, TAKE 1 TABLET BY MOUTH EVERY DAY, Disp: 90 tablet, Rfl: 3   cyclobenzaprine (FLEXERIL) 5 MG tablet, TAKE 1 TABLET BY MOUTH TWICE A DAY AS NEEDED FOR MUSCLE SPASMS, Disp: 30 tablet, Rfl: 1   furosemide (LASIX) 40 MG tablet, TAKE 1 TABLET BY MOUTH EVERY DAY, Disp: 90 tablet, Rfl: 1   lidocaine (LIDODERM) 5 %, Place 1 patch onto the skin daily. Remove & Discard patch within 12 hours or as directed by MD, Disp: 5 patch, Rfl: 0   metoprolol tartrate (LOPRESSOR) 50 MG tablet, TAKE 1 TABLET BY MOUTH TWICE A DAY, Disp: 180 tablet, Rfl: 1   traMADol (ULTRAM) 50 MG tablet, Take 0.5-1 tablets (25-50 mg total) by mouth 2 (two) times daily as needed. For chronic low back pain. May refill monthly, Disp: 60 tablet, Rfl: 2  I spent 62 minutes dedicated to the care of this patient on the date of this encounter to include pre-visit review of records, face-to-face time with the patient discussing conditions above, post visit ordering of testing, clinical documentation with the electronic health record, making appropriate referrals as documented, and communicating necessary findings to members of the patients care team.   Garner Nash, DO [facility] 05/21/2022 3:06 PM

## 2022-05-23 ENCOUNTER — Other Ambulatory Visit: Payer: Self-pay | Admitting: Family Medicine

## 2022-06-12 ENCOUNTER — Ambulatory Visit (HOSPITAL_COMMUNITY)
Admission: RE | Admit: 2022-06-12 | Discharge: 2022-06-12 | Disposition: A | Discharge status: Home / Self Care | Payer: 59 | Source: Ambulatory Visit | Attending: Pulmonary Disease | Admitting: Pulmonary Disease

## 2022-06-12 DIAGNOSIS — R911 Solitary pulmonary nodule: Secondary | ICD-10-CM | POA: Diagnosis not present

## 2022-06-12 DIAGNOSIS — J439 Emphysema, unspecified: Secondary | ICD-10-CM | POA: Diagnosis not present

## 2022-06-12 DIAGNOSIS — R918 Other nonspecific abnormal finding of lung field: Secondary | ICD-10-CM | POA: Diagnosis not present

## 2022-06-12 LAB — GLUCOSE, CAPILLARY: Glucose-Capillary: 115 mg/dL — ABNORMAL HIGH (ref 70–99)

## 2022-06-12 MED ORDER — FLUDEOXYGLUCOSE F - 18 (FDG) INJECTION
5.3000 | Freq: Once | INTRAVENOUS | Status: AC | PRN
  Administered 2022-06-12: 5.3 via INTRAVENOUS

## 2022-06-17 ENCOUNTER — Encounter: Payer: 59 | Admitting: Family Medicine

## 2022-06-19 ENCOUNTER — Encounter: Payer: Self-pay | Admitting: Acute Care

## 2022-06-19 ENCOUNTER — Encounter: Payer: Self-pay | Admitting: Pulmonary Disease

## 2022-06-19 ENCOUNTER — Ambulatory Visit (INDEPENDENT_AMBULATORY_CARE_PROVIDER_SITE_OTHER): Payer: 59 | Admitting: Acute Care

## 2022-06-19 VITALS — BP 102/78 | HR 71 | Temp 97.6°F | Ht 60.0 in | Wt 107.0 lb

## 2022-06-19 DIAGNOSIS — Z87891 Personal history of nicotine dependence: Secondary | ICD-10-CM | POA: Diagnosis not present

## 2022-06-19 DIAGNOSIS — R911 Solitary pulmonary nodule: Secondary | ICD-10-CM | POA: Diagnosis not present

## 2022-06-19 NOTE — Patient Instructions (Addendum)
It is good to see you today. Your PET scan did show the nodule in the left lower lobe is concerning for cancer. We have scheduled you for a Bronchoscopy with biopsies 06/26/2022 at 10:15 am. You will need to be at [facility] a few hours before the procedure. You will need someone to be at the hospital with you the entire time. Follow up visit with Kaide Gage NP 5/7 at 9:30 am for follow up to ensure you are doing well.  Call if you have any questions or need [place] sooner.  Please contact office for sooner follow up if symptoms do not improve or worsen or seek emergency care
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
## 2022-06-19 NOTE — Progress Notes (Signed)
History of Present Illness Sheena [name] is a 77 y.o. female former smoker with past medical history of hypertension, and Longstanding history of tobacco use. She was referred to Dr. Tonia Brooms for finding of a 1.6 cm left lower lobe pulmonary nodule 05/2022 by Dr. Tana Conch.    Synopsis 77 year old female, past medical history of hypertension. Longstanding history of tobacco use. She is a former smoker she smoked for 45 years. She also has a history of chronic back pain and history of kyphoplasty. CT imaging of the chest complete in January revealed a 1.6 cm left lower lobe pulmonary nodule. Patient was referred for evaluation of pulmonary nodule due to concern of underlying malignancy by Dr. Tana Conch 05/2022. She was seen  by Dr. Tonia Brooms 05/21/2022, and PET scan was ordered for evaluation of the nodule of concern. She is here today to discuss results of the PET scan and determine best steps moving forward based on these results.    06/19/2022 Pt. Presents for follow up to review PET scan .She is here today with her husband who is a former patient of Dr. Tonia Brooms , also previously diagnosed with lung cancer , and had subsequent resection.   Sheena [name] PET scan shows Macro lobulated pulmonary nodule is again seen in the posterior left lower lobe which measures 2.8 x 2.2 cm. This is hypermetabolic, with SUV max of 9.1, highly suspicious for bronchogenic carcinoma. We discussed this result. Plan will be for bronchoscopy with biopsy of the pulmonary nodule as well as lymph nodes of concern . I have reviewed the process of bronchoscopy as well as that it is done under general anesthesia, as a day procedure. We have scheduled her for this procedure 06/16/2022. We discussed the risks of infection, bleeding, pneumothorax and anesthesia reactions. She agrees that this is the next best step to determine what the abnormal imaging findings are so she can seek appropriate treatment. Her husband is supportive in  her decision. Both understand she will need him to stay at the hospital day of surgery as her transportation and support. She will be seen by me one week after her bronchoscopy to ensure she is doing well and to review pathology and make appropriate referrals.      Synopsis 77 year old female, past medical history of hypertension. Longstanding history of tobacco use. She is a former smoker she smoked for 45 years. She also has a history of chronic back pain and history of kyphoplasty. CT imaging of the chest complete in January revealed a 1.6 cm left lower lobe pulmonary nodule.  Patient was referred for evaluation of pulmonary nodule due to concern of underlying malignancy by Dr. Durene Cal. She was seen by Dr. Tonia Brooms 05/21/2022, and plan was for PET scan to better evaluate the nodule and determine need for Biopsy. She is here for to discuss PET scan results.  Test Results:  PET scan 06/12/2022 Macrolobulated pulmonary nodule is again seen in the posterior left lower lobe which measures 2.8 x 2.2 cm. This is hypermetabolic, with SUV max of 9.1, highly suspicious for bronchogenic carcinoma.   Hypermetabolic lymphadenopathy is seen in the left hilar region, with SUV max measuring up to 8.5. Hypermetabolic mediastinal lymph nodes are seen in the AP window, pretracheal, and subcarinal regions. Largest index lymph node in the subcarinal region measures 12 mm short axis on image 66/4, with SUV max of 10.1.  2.8 cm hypermetabolic left lower lobe pulmonary nodule, highly suspicious for bronchogenic carcinoma.   Hypermetabolic lymphadenopathy in left  hilum, subcarinal, AP window, and pretracheal regions, consistent with metastatic disease.   No evidence of metastatic disease within the neck, abdomen, or pelvis.  03/07/2022 CT chest: Lobulated 1.6 mm pulmonary nodule within the left lower lobe concerning for malignancy.     Latest Ref Rng & Units 05/07/2022    1:57 PM 03/06/2022    7:41 PM 01/01/2022     2:47 PM  CBC  WBC 4.0 - 10.5 K/uL 10.0  13.4  9.9   Hemoglobin 12.0 - 15.0 g/dL 16.1  09.6  04.5   Hematocrit 36.0 - 46.0 % 38.3  40.9  37.3   Platelets 150.0 - 400.0 K/uL 338.0  507  375.0        Latest Ref Rng & Units 05/07/2022    1:57 PM 03/06/2022    7:41 PM 01/01/2022    2:47 PM  BMP  Glucose 70 - 99 mg/dL 409  811  914   BUN 6 - 23 mg/dL 12  23  19    Creatinine 0.40 - 1.20 mg/dL 7.82  9.56  2.13   Sodium 135 - 145 mEq/L 137  127  137   Potassium 3.5 - 5.1 mEq/L 4.2  4.4  5.8 No hemolysis seen   Chloride 96 - 112 mEq/L 99  84  98   CO2 19 - 32 mEq/L 28  27  30    Calcium 8.4 - 10.5 mg/dL 08.6  57.8  46.9     BNP No results found for: "BNP"  ProBNP No results found for: "PROBNP"  PFT No results found for: "FEV1PRE", "FEV1POST", "FVCPRE", "FVCPOST", "TLC", "DLCOUNC", "PREFEV1FVCRT", "PSTFEV1FVCRT"  CT Super D Chest Wo Contrast  Result Date: 06/15/2022 CLINICAL DATA:  Left lower lobe lung nodule. Pre bronchoscopy and biopsy planning. EXAM: CT CHEST WITHOUT CONTRAST TECHNIQUE: Multidetector CT imaging of the chest was performed using thin slice collimation for electromagnetic bronchoscopy planning purposes, without intravenous contrast. RADIATION DOSE REDUCTION: This exam was performed according to the departmental dose-optimization program which includes automated exposure control, adjustment of the mA and/or kV according to patient size and/or use of iterative reconstruction technique. COMPARISON:  03/07/2022 FINDINGS: Cardiovascular: Normal heart size. Aortic atherosclerosis and coronary artery calcifications. No pericardial effusion. Mediastinum/Nodes: Thyroid gland, trachea, and esophagus are unremarkable. Tracer avid mediastinal and left hilar lymph nodes are identified on the PET-CT which was also obtained today. Index left paratracheal lymph node measures 0.9 cm, image 21/2. Tracer avid lymph node posterior to the left mainstem bronchus measures 1.2 cm, image 26/2. Measures 1.2  cm, image 28/2. No enlarged axillary or supraclavicular lymph nodes. Lungs/Pleura: No pleural effusion, airspace consolidation, or pneumothorax. Emphysema. Left lower lobe lung mass, tracer avid on today's PET-CT measures 3.0 x 2.0 cm, image 99/7. Adjacent small nodules are identified which may represent areas of mucoid impaction and or satellite lesions. Index nodule measures 0.6 cm, image 107/7. Within the lateral aspect of the left lower lobe there is a 4 mm nodule, image 85/7. No suspicious nodules identified within the left upper lobe or right lung. Upper Abdomen: No acute abnormality. Small hiatal hernia identified. Musculoskeletal: Multilevel thoracic compression deformities are again seen involving T5, T8, T11 and T12. The T12 fracture has been treated with kyphoplasty. No aggressive lytic or sclerotic bone lesions identified. IMPRESSION: 1. Left lower lobe lung mass, tracer avid on today's PET-CT measures 3.0 x 2.0 cm. Adjacent small nodules may represent areas of mucoid impaction and/or satellite lesions. 2. Tracer avid mediastinal and left hilar lymph nodes are  identified on today's PET-CT which was also obtained today. 3.  Aortic Atherosclerosis (ICD10-I70.0). Electronically Signed   By: Signa Kell M.D.   On: 06/15/2022 11:26   NM PET Image Initial (PI) Skull Base To Thigh (F-18 FDG)  Result Date: 06/15/2022 CLINICAL DATA:  Initial treatment strategy for left lung nodule. EXAM: NUCLEAR MEDICINE PET SKULL BASE TO THIGH TECHNIQUE: 5.3 mCi F-18 FDG was injected intravenously. Full-ring PET imaging was performed from the skull base to thigh after the radiotracer. CT data was obtained and used for attenuation correction and anatomic localization. Fasting blood glucose: 115 mg/dl COMPARISON:  Chest CTA on 03/07/2022 FINDINGS: Mediastinal blood-pool activity (background): SUV max = 2.6 Liver activity (reference): SUV max = N/A NECK:  No hypermetabolic lymph nodes or masses. Incidental CT findings:   None. CHEST: Macrolobulated pulmonary nodule is again seen in the posterior left lower lobe which measures 2.8 x 2.2 cm. This is hypermetabolic, with SUV max of 9.1, highly suspicious for bronchogenic carcinoma. Hypermetabolic lymphadenopathy is seen in the left hilar region, with SUV max measuring up to 8.5. Hypermetabolic mediastinal lymph nodes are seen in the AP window, pretracheal, and subcarinal regions. Largest index lymph node in the subcarinal region measures 12 mm short axis on image 66/4, with SUV max of 10.1. Incidental CT findings: Mild to moderate centrilobular emphysema. Aortic and coronary atherosclerotic calcification incidentally noted. Small hiatal hernia also noted. ABDOMEN/PELVIS: No abnormal hypermetabolic activity within the liver, pancreas, adrenal glands, or spleen. No hypermetabolic lymph nodes in the abdomen or pelvis. Incidental CT findings:  None. SKELETON: No focal hypermetabolic bone lesions to suggest skeletal metastasis. Incidental CT findings:  None. IMPRESSION: 2.8 cm hypermetabolic left lower lobe pulmonary nodule, highly suspicious for bronchogenic carcinoma. Hypermetabolic lymphadenopathy in left hilum, subcarinal, AP window, and pretracheal regions, consistent with metastatic disease. No evidence of metastatic disease within the neck, abdomen, or pelvis. Aortic Atherosclerosis (ICD10-I70.0) and Emphysema (ICD10-J43.9). Electronically Signed   By: Danae Orleans M.D.   On: 06/15/2022 09:18     Past medical hx Past Medical History:  Diagnosis Date   Anxiety    Heart murmur    Hypertension    Scarlet fever with other complications childhood   "had to learn to work again" has had leg weakness     Social History   Tobacco Use   Smoking status: Former    Packs/day: 1.00    Years: 45.00    Additional pack years: 0.00    Total pack years: 45.00    Types: Cigarettes   Smokeless tobacco: Never  Substance Use Topics   Alcohol use: No   Drug use: No    Sheena [name]  reports that she has quit smoking. Her smoking use included cigarettes. She has a 45.00 pack-year smoking history. She has never used smokeless tobacco. She reports that she does not drink alcohol and does not use drugs.  Tobacco Cessation: Former smoker , Quit 07/09/2017, 45 pack year smoking history.   Past surgical hx, Family hx, Social hx all reviewed.  Current Outpatient Medications on File Prior to Visit  Medication Sig   acetaminophen (TYLENOL) 500 MG tablet Take 500 mg by mouth 2 (two) times daily as needed for mild pain. Reported on 06/04/2015   amLODipine (NORVASC) 5 MG tablet TAKE 1 TABLET (5 MG TOTAL) BY MOUTH DAILY.   aspirin EC 81 MG tablet Take 81 mg by mouth 2 (two) times a week.   atorvastatin (LIPITOR) 20 MG tablet TAKE 1 TABLET BY MOUTH EVERY  DAY   cyclobenzaprine (FLEXERIL) 5 MG tablet TAKE 1 TABLET BY MOUTH TWICE A DAY AS NEEDED FOR MUSCLE SPASMS   furosemide (LASIX) 40 MG tablet TAKE 1 TABLET BY MOUTH EVERY DAY   lidocaine (LIDODERM) 5 % Place 1 patch onto the skin daily. Remove & Discard patch within 12 hours or as directed by MD   metoprolol tartrate (LOPRESSOR) 50 MG tablet TAKE 1 TABLET BY MOUTH TWICE A DAY   traMADol (ULTRAM) 50 MG tablet Take 0.5-1 tablets (25-50 mg total) by mouth 2 (two) times daily as needed. For chronic low back pain. May refill monthly   No current facility-administered medications on file prior to visit.     Allergies  Allergen Reactions   Antihistamines, Diphenhydramine-Type Other (See Comments)    REACTION: palpitations, feel jittery   Aspirin Other (See Comments)    REACTION: Stomach cramps   Evista [Raloxifene]     sweats    Review Of Systems:  Constitutional:   No  weight loss, night sweats,  Fevers, chills, fatigue, or  lassitude.  HEENT:   No headaches,  Difficulty swallowing,  Tooth/dental problems, or  Sore throat,                No sneezing, itching, ear ache, nasal congestion, post nasal drip,   CV:  No chest pain,   Orthopnea, PND, swelling in lower extremities, anasarca, dizziness, palpitations, syncope.   GI  No heartburn, indigestion, abdominal pain, nausea, vomiting, diarrhea, change in bowel habits, loss of appetite, bloody stools.   Resp: No shortness of breath with exertion or at rest.  No excess mucus, no productive cough,  No non-productive cough,  No coughing up of blood.  No change in color of mucus.  No wheezing.  No chest wall deformity  Skin: no rash or lesions.  GU: no dysuria, change in color of urine, no urgency or frequency.  No flank pain, no hematuria   MS:  No joint pain or swelling.  No decreased range of motion.  No back pain.  Psych:  No change in mood or affect. No depression or anxiety.  No memory loss.   Vital Signs BP 102/78   Pulse 71   Temp 97.6 F (36.4 C)   Ht 5' (1.524 m)   Wt 107 lb (48.5 kg)   SpO2 98%   BMI 20.90 kg/m    Physical Exam:  General- No distress,  A&Ox3, pleasant  ENT: No sinus tenderness, TM clear, pale nasal mucosa, no oral exudate,no post nasal drip, no LAN Cardiac: S1, S2, regular rate and rhythm, no murmur Chest: No wheeze/ rales/ dullness; no accessory muscle use, no nasal flaring, no sternal retractions Abd.: Soft Non-tender, ND, BS +, Body mass index is 20.9 kg/m.  Ext: No clubbing cyanosis, edema Neuro:  normal strength, MAE x 4, A&O x 3 Skin: No rashes, warm and dry, no lesions  Psych: normal mood and behavior, anxious   Assessment/Plan PET avid LLL nodule Former smoker quit 2019 with a 45 pack year smoking history Plan Your PET scan did show the nodule in the left lower lobe is concerning for cancer. Plan is for bronchoscopy with biopsies We have scheduled you for a Bronchoscopy with biopsies 06/26/2022 at 10:15 am. We have discussed all possible risks  You will need to be at [facility] a few hours before the procedure. You will need someone to be at the hospital with you the entire time. Follow up visit with Maralyn Sago NP  5/7 at 9:30 am for follow up to ensure you are doing well.  Call if you have any questions or need [place] sooner.  Please contact office for sooner follow up if symptoms do not improve or worsen or seek emergency care    I spent 40 minutes dedicated to the care of this patient on the date of this encounter to include pre-visit review of records, face-to-face time with the patient discussing conditions above, post visit ordering of testing, clinical documentation with the electronic health record, making appropriate referrals as documented, and communicating necessary information to the patient's healthcare team.    Bevelyn Ngo, NP 06/19/2022  11:00 AM

## 2022-06-23 ENCOUNTER — Ambulatory Visit: Payer: 59 | Admitting: Acute Care

## 2022-06-26 ENCOUNTER — Other Ambulatory Visit: Payer: Self-pay

## 2022-06-26 ENCOUNTER — Other Ambulatory Visit: Payer: 59

## 2022-06-26 DIAGNOSIS — R911 Solitary pulmonary nodule: Secondary | ICD-10-CM | POA: Diagnosis not present

## 2022-06-28 LAB — SPECIMEN STATUS REPORT

## 2022-06-28 LAB — NOVEL CORONAVIRUS, NAA: SARS-CoV-2, NAA: NOT DETECTED

## 2022-06-29 ENCOUNTER — Other Ambulatory Visit: Payer: Self-pay

## 2022-06-29 ENCOUNTER — Encounter (HOSPITAL_COMMUNITY): Payer: Self-pay | Admitting: Pulmonary Disease

## 2022-06-29 NOTE — Progress Notes (Signed)
Reviewed prior to bronchoscopy   Thanks,  BLI  [name], DO [facility] 06/29/2022 8:12 PM

## 2022-06-29 NOTE — Progress Notes (Signed)
Anesthesia Chart Review: SAME DAY WORK-UP  Case: [id number] Date/Time: 06/30/22 1500   Procedures:      VIDEO BRONCHOSCOPY WITH ENDOBRONCHIAL ULTRASOUND     ROBOTIC ASSISTED NAVIGATIONAL BRONCHOSCOPY   Anesthesia type: General   Pre-op diagnosis: lung nodule   Location: [facility] / [facility]   Surgeons: [name], DO       DISCUSSION: Patient is a 77 year old female scheduled for the above procedure. LLL pulmonary nodule noted on imaging during 03/07/22 ED evaluation for chest pain.   History includes former smoker, HTN, childhood Scarlet Fever, anxiety, kyphoplasty, murmur (mild MR/AR 2014 echo).    She is a same day work-up. PCP is Dr. Tana Conch. She is for updated labs as indicated and anesthesia team evaluation on the day of surgery.    VS:  BP Readings from Last 3 Encounters:  06/19/22 102/78  05/21/22 (!) 140/80  05/07/22 124/70   Pulse Readings from Last 3 Encounters:  06/19/22 71  05/21/22 (!) 129  05/07/22 76     PROVIDERS: [name], MD is PCP    LABS: For day of surgery as indicated. Last results in [facility] include: Lab Results  Component Value Date   WBC 10.0 05/07/2022   HGB 12.6 05/07/2022   HCT 38.3 05/07/2022   PLT 338.0 05/07/2022   GLUCOSE 106 (H) 05/07/2022   ALT 9 05/07/2022   AST 19 05/07/2022   NA 137 05/07/2022   K 4.2 05/07/2022   CL 99 05/07/2022   CREATININE 1.10 05/07/2022   BUN 12 05/07/2022   CO2 28 05/07/2022   HGBA1C 5.9 01/01/2022    IMAGES: PET Scan 06/12/22: IMPRESSION: - 2.8 cm hypermetabolic left lower lobe pulmonary nodule, highly suspicious for bronchogenic carcinoma. - Hypermetabolic lymphadenopathy in left hilum, subcarinal, AP window, and pretracheal regions, consistent with metastatic disease. - No evidence of metastatic disease within the neck, abdomen, or pelvis. - Aortic Atherosclerosis (ICD10-I70.0) and Emphysema (ICD10-J43.9).   CT Super D Chest 06/12/22: MPRESSION: 1. Left  lower lobe lung mass, tracer avid on today's PET-CT measures 3.0 x 2.0 cm. Adjacent small nodules may represent areas of mucoid impaction and/or satellite lesions. 2. Tracer avid mediastinal and left hilar lymph nodes are identified on today's PET-CT which was also obtained today. 3.  Aortic Atherosclerosis (ICD10-I70.0).   EKG: 03/09/22: Sinus tachycardia at 122 bpm Septal infarct , age undetermined Abnormal ECG No previous ECGs available Confirmed by Kennis Carina [phone]) on 03/07/2022 12:43:12 AM    CV: Echo 11/03/12: Study Conclusions   - Left ventricle: The cavity size was normal. Systolic    function was normal. The estimated ejection fraction was    in the range of 55% to 60%. Wall motion was normal; there    were no regional wall motion abnormalities. Features are    consistent with a pseudonormal left ventricular filling    pattern, with concomitant abnormal relaxation and    increased filling pressure (grade 2 diastolic    dysfunction).  - Aortic valve: There was no stenosis. Mild regurgitation.  - Mitral valve: Mild regurgitation.  - Left atrium: The atrium was mildly dilated.  - Right ventricle: The cavity size was normal. Systolic    function was normal.  - Tricuspid valve: Peak RV-RA gradient: 19mm Hg (S).  - Pulmonary arteries: PA peak pressure: 24mm Hg (S).  - Inferior vena cava: The vessel was normal in size; the    respirophasic diameter changes were in the  normal range (=    50%); findings are consistent with normal central venous    pressure.  Impressions:   - Normal LV size and systolic function, EF 55-60%. Moderate    diastolic dysfunction. Normal RV size and systolic    function. Mild AI and mild MR.     Past Medical History:  Diagnosis Date   Anxiety    Heart murmur    Hypertension    Scarlet fever with other complications childhood   "had to learn to work again" has had leg weakness    Past Surgical History:  Procedure Laterality Date    KYPHOPLASTY     2014   peridontal surgery      MEDICATIONS: No current facility-administered medications for this encounter.    acetaminophen (TYLENOL) 500 MG tablet   amLODipine (NORVASC) 5 MG tablet   aspirin EC 81 MG tablet   atorvastatin (LIPITOR) 20 MG tablet   Calcium Citrate-Vitamin D (CALCIUM CITRATE PETITE/VIT D PO)   cyclobenzaprine (FLEXERIL) 5 MG tablet   famotidine (PEPCID) 20 MG tablet   furosemide (LASIX) 40 MG tablet   metoprolol tartrate (LOPRESSOR) 50 MG tablet   traMADol (ULTRAM) 50 MG tablet   lidocaine (LIDODERM) 5 %    Shonna Chock, PA-C Surgical Short Stay/Anesthesiology [facility] Phone [phone] [facility] Phone [phone] 06/29/2022 12:37 PM

## 2022-06-29 NOTE — Progress Notes (Signed)
SDW call  Patient was given pre-op instructions over the phone. Patient verbalized understanding of instructions provided.     PCP - Dr. Annice Pih Cardiologist - Denies Pulmonary:    PPM/ICD - Denies   Chest x-ray - 03/16/2022 EKG -  03/09/2022 Stress Test - ECHO - 04/15/2012 Cardiac Cath -   Sleep Study/sleep apnea/CPAP: Denies  Non-diabetic  Blood Thinner Instructions: Denies Aspirin Instructions: Last Dose 06/28/2022   ERAS Protcol - No, NPO PRE-SURGERY Ensure or G2-    COVID TEST- Negative 06/26/2022    Anesthesia review: Yes. HTN, heart murmur   Patient denies shortness of breath, fever, cough and chest pain over the phone call  Your procedure is scheduled on Tuesday June 30, 2022  Report to [facility] Main Entrance "A" at 1230   A.M., then check in with the Admitting office.  Call this number if you have problems the morning of surgery:  [phone]   If you have any questions prior to your surgery date call [phone]: Open Monday-Friday 8am-4pm If you experience any cold or flu symptoms such as cough, fever, chills, shortness of breath, etc. between now and your scheduled surgery, please notify us at the above number     Remember:  Do not eat or drink after midnight the night before your surgery  Take these medicines the morning of surgery with A SIP OF WATER:  Metoprolol, Pepecid  As needed: Tylenol, flexeril, ultram  As of today, STOP taking any Aspirin (unless otherwise instructed by your surgeon) Aleve, Naproxen, Ibuprofen, Motrin, Advil, Goody's, BC's, all herbal medications, fish oil, and all vitamins.

## 2022-06-29 NOTE — Progress Notes (Signed)
Reviewed prior to bronch  Thanks,  BLI  [name], DO [facility] 06/29/2022 8:31 PM

## 2022-06-29 NOTE — Anesthesia Preprocedure Evaluation (Signed)
Anesthesia Evaluation  Patient identified by MRN, date of birth, ID band Patient awake    Reviewed: Allergy & Precautions, H&P , NPO status , Patient's Chart, lab work & pertinent test results  Airway Mallampati: II  TM Distance: >3 FB Neck ROM: Full    Dental no notable dental hx.    Pulmonary neg pulmonary ROS, former smoker   Pulmonary exam normal breath sounds clear to auscultation       Cardiovascular hypertension, Pt. on medications negative cardio ROS Normal cardiovascular exam Rhythm:Regular Rate:Normal     Neuro/Psych   Anxiety     negative neurological ROS  negative psych ROS   GI/Hepatic negative GI ROS, Neg liver ROS,,,  Endo/Other  negative endocrine ROS    Renal/GU negative Renal ROS  negative genitourinary   Musculoskeletal negative musculoskeletal ROS (+)    Abdominal   Peds negative pediatric ROS (+)  Hematology negative hematology ROS (+)   Anesthesia Other Findings Lung mass  Reproductive/Obstetrics negative OB ROS                             Anesthesia Physical Anesthesia Plan  ASA: 3  Anesthesia Plan: General   Post-op Pain Management: Minimal or no pain anticipated   Induction: Intravenous  PONV Risk Score and Plan: 3 and Ondansetron, Dexamethasone, Midazolam and Treatment may vary due to age or medical condition  Airway Management Planned: Oral ETT  Additional Equipment:   Intra-op Plan:   Post-operative Plan: Extubation in OR  Informed Consent: I have reviewed the patients History and Physical, chart, labs and discussed the procedure including the risks, benefits and alternatives for the proposed anesthesia with the patient or authorized representative who has indicated his/her understanding and acceptance.     Dental advisory given  Plan Discussed with: CRNA  Anesthesia Plan Comments: (PAT note written 06/29/2022 by Shonna Chock, PA-C.  )        Anesthesia Quick Evaluation

## 2022-06-30 ENCOUNTER — Encounter (HOSPITAL_COMMUNITY): Payer: Self-pay | Admitting: Pulmonary Disease

## 2022-06-30 ENCOUNTER — Ambulatory Visit (HOSPITAL_COMMUNITY)
Admission: RE | Admit: 2022-06-30 | Discharge: 2022-06-30 | Disposition: A | Discharge status: Home / Self Care | Payer: 59 | Source: Ambulatory Visit | Attending: Pulmonary Disease | Admitting: Pulmonary Disease

## 2022-06-30 ENCOUNTER — Ambulatory Visit (HOSPITAL_COMMUNITY): Payer: 59 | Admitting: Vascular Surgery

## 2022-06-30 ENCOUNTER — Ambulatory Visit (HOSPITAL_BASED_OUTPATIENT_CLINIC_OR_DEPARTMENT_OTHER): Payer: 59 | Admitting: Vascular Surgery

## 2022-06-30 ENCOUNTER — Ambulatory Visit (HOSPITAL_COMMUNITY): Payer: 59

## 2022-06-30 ENCOUNTER — Encounter (HOSPITAL_COMMUNITY)
Admission: RE | Disposition: A | Discharge status: Home / Self Care | Payer: Self-pay | Source: Ambulatory Visit | Attending: Pulmonary Disease

## 2022-06-30 DIAGNOSIS — R591 Generalized enlarged lymph nodes: Secondary | ICD-10-CM | POA: Diagnosis not present

## 2022-06-30 DIAGNOSIS — I10 Essential (primary) hypertension: Secondary | ICD-10-CM | POA: Diagnosis not present

## 2022-06-30 DIAGNOSIS — C799 Secondary malignant neoplasm of unspecified site: Secondary | ICD-10-CM | POA: Insufficient documentation

## 2022-06-30 DIAGNOSIS — Z87891 Personal history of nicotine dependence: Secondary | ICD-10-CM

## 2022-06-30 DIAGNOSIS — R599 Enlarged lymph nodes, unspecified: Secondary | ICD-10-CM | POA: Diagnosis not present

## 2022-06-30 DIAGNOSIS — M549 Dorsalgia, unspecified: Secondary | ICD-10-CM | POA: Diagnosis not present

## 2022-06-30 DIAGNOSIS — R590 Localized enlarged lymph nodes: Secondary | ICD-10-CM

## 2022-06-30 DIAGNOSIS — G8929 Other chronic pain: Secondary | ICD-10-CM | POA: Diagnosis not present

## 2022-06-30 DIAGNOSIS — C801 Malignant (primary) neoplasm, unspecified: Secondary | ICD-10-CM | POA: Diagnosis not present

## 2022-06-30 DIAGNOSIS — C969 Malignant neoplasm of lymphoid, hematopoietic and related tissue, unspecified: Secondary | ICD-10-CM | POA: Insufficient documentation

## 2022-06-30 DIAGNOSIS — Z09 Encounter for follow-up examination after completed treatment for conditions other than malignant neoplasm: Secondary | ICD-10-CM | POA: Insufficient documentation

## 2022-06-30 DIAGNOSIS — C771 Secondary and unspecified malignant neoplasm of intrathoracic lymph nodes: Secondary | ICD-10-CM | POA: Diagnosis not present

## 2022-06-30 DIAGNOSIS — R911 Solitary pulmonary nodule: Secondary | ICD-10-CM | POA: Diagnosis not present

## 2022-06-30 DIAGNOSIS — C3490 Malignant neoplasm of unspecified part of unspecified bronchus or lung: Secondary | ICD-10-CM | POA: Diagnosis not present

## 2022-06-30 DIAGNOSIS — J439 Emphysema, unspecified: Secondary | ICD-10-CM | POA: Diagnosis not present

## 2022-06-30 HISTORY — PX: BRONCHIAL NEEDLE ASPIRATION BIOPSY: SHX5106

## 2022-06-30 HISTORY — PX: VIDEO BRONCHOSCOPY WITH ENDOBRONCHIAL ULTRASOUND: SHX6177

## 2022-06-30 LAB — CBC
HCT: 39.8 % (ref 36.0–46.0)
Hemoglobin: 12.5 g/dL (ref 12.0–15.0)
MCH: 31.3 pg (ref 26.0–34.0)
MCHC: 31.4 g/dL (ref 30.0–36.0)
MCV: 99.5 fL (ref 80.0–100.0)
Platelets: 282 10*3/uL (ref 150–400)
RBC: 4 MIL/uL (ref 3.87–5.11)
RDW: 12 % (ref 11.5–15.5)
WBC: 8.9 10*3/uL (ref 4.0–10.5)
nRBC: 0 % (ref 0.0–0.2)

## 2022-06-30 LAB — BASIC METABOLIC PANEL
Anion gap: 14 (ref 5–15)
BUN: 15 mg/dL (ref 8–23)
CO2: 22 mmol/L (ref 22–32)
Calcium: 9.8 mg/dL (ref 8.9–10.3)
Chloride: 99 mmol/L (ref 98–111)
Creatinine, Ser: 1.21 mg/dL — ABNORMAL HIGH (ref 0.44–1.00)
GFR, Estimated: 47 mL/min — ABNORMAL LOW (ref >60)
Glucose, Bld: 129 mg/dL — ABNORMAL HIGH (ref 70–99)
Potassium: 4.9 mmol/L (ref 3.5–5.1)
Sodium: 135 mmol/L (ref 135–145)

## 2022-06-30 SURGERY — BRONCHOSCOPY, WITH EBUS
Anesthesia: General

## 2022-06-30 MED ORDER — ONDANSETRON HCL 4 MG/2ML IJ SOLN
INTRAMUSCULAR | Status: DC | PRN
  Administered 2022-06-30: 4 mg via INTRAVENOUS

## 2022-06-30 MED ORDER — ROCURONIUM BROMIDE 10 MG/ML (PF) SYRINGE
PREFILLED_SYRINGE | INTRAVENOUS | Status: DC | PRN
  Administered 2022-06-30: 50 mg via INTRAVENOUS

## 2022-06-30 MED ORDER — OXYCODONE HCL 5 MG/5ML PO SOLN: 5.0000 mg | Freq: Once | ORAL | Status: DC | PRN

## 2022-06-30 MED ORDER — LACTATED RINGERS IV SOLN: INTRAVENOUS | Status: DC

## 2022-06-30 MED ORDER — SUGAMMADEX SODIUM 200 MG/2ML IV SOLN
INTRAVENOUS | Status: DC | PRN
  Administered 2022-06-30: 200 mg via INTRAVENOUS

## 2022-06-30 MED ORDER — OXYCODONE HCL 5 MG PO TABS: 5.0000 mg | ORAL_TABLET | Freq: Once | ORAL | Status: DC | PRN

## 2022-06-30 MED ORDER — AMISULPRIDE (ANTIEMETIC) 5 MG/2ML IV SOLN: 10.0000 mg | Freq: Once | INTRAVENOUS | Status: DC | PRN

## 2022-06-30 MED ORDER — HYDROMORPHONE HCL 1 MG/ML IJ SOLN: 0.2500 mg | INTRAMUSCULAR | Status: DC | PRN

## 2022-06-30 MED ORDER — FENTANYL CITRATE (PF) 100 MCG/2ML IJ SOLN
INTRAMUSCULAR | Status: AC
  Filled 2022-06-30: qty 2

## 2022-06-30 MED ORDER — CHLORHEXIDINE GLUCONATE 0.12 % MT SOLN
OROMUCOSAL | Status: AC
  Administered 2022-06-30: 15 mL via OROMUCOSAL
  Filled 2022-06-30: qty 15

## 2022-06-30 MED ORDER — PHENYLEPHRINE HCL-NACL 20-0.9 MG/250ML-% IV SOLN
INTRAVENOUS | Status: DC | PRN
  Administered 2022-06-30: 50 ug/min via INTRAVENOUS

## 2022-06-30 MED ORDER — PROPOFOL 500 MG/50ML IV EMUL
INTRAVENOUS | Status: DC | PRN
  Administered 2022-06-30: 150 ug/kg/min via INTRAVENOUS

## 2022-06-30 MED ORDER — PROPOFOL 10 MG/ML IV BOLUS
INTRAVENOUS | Status: DC | PRN
  Administered 2022-06-30: 150 mg via INTRAVENOUS

## 2022-06-30 MED ORDER — CHLORHEXIDINE GLUCONATE 0.12 % MT SOLN
15.0000 mL | OROMUCOSAL | Status: AC
  Filled 2022-06-30: qty 15

## 2022-06-30 MED ORDER — FENTANYL CITRATE (PF) 250 MCG/5ML IJ SOLN
INTRAMUSCULAR | Status: DC | PRN
  Administered 2022-06-30 (×2): 50 ug via INTRAVENOUS

## 2022-06-30 MED ORDER — DEXAMETHASONE SODIUM PHOSPHATE 10 MG/ML IJ SOLN
INTRAMUSCULAR | Status: DC | PRN
  Administered 2022-06-30: 10 mg via INTRAVENOUS

## 2022-06-30 MED ORDER — LIDOCAINE 2% (20 MG/ML) 5 ML SYRINGE
INTRAMUSCULAR | Status: DC | PRN
  Administered 2022-06-30: 60 mg via INTRAVENOUS

## 2022-06-30 MED ORDER — PHENYLEPHRINE 80 MCG/ML (10ML) SYRINGE FOR IV PUSH (FOR BLOOD PRESSURE SUPPORT)
PREFILLED_SYRINGE | INTRAVENOUS | Status: DC | PRN
  Administered 2022-06-30: 240 ug via INTRAVENOUS
  Administered 2022-06-30 (×3): 160 ug via INTRAVENOUS

## 2022-06-30 MED ORDER — PROMETHAZINE HCL 25 MG/ML IJ SOLN: 6.2500 mg | INTRAMUSCULAR | Status: DC | PRN

## 2022-06-30 SURGICAL SUPPLY — 29 items

## 2022-06-30 NOTE — Discharge Instructions (Signed)

## 2022-06-30 NOTE — Anesthesia Procedure Notes (Signed)
Procedure Name: Intubation Date/Time: 06/30/2022 2:33 PM  Performed by: Macie Burows, CRNAPre-anesthesia Checklist: Patient identified, Emergency Drugs available, Suction available and Patient being monitored Patient Re-evaluated:Patient Re-evaluated prior to induction Oxygen Delivery Method: Circle system utilized Preoxygenation: Pre-oxygenation with 100% oxygen Induction Type: IV induction Ventilation: Mask ventilation without difficulty Laryngoscope Size: Mac and 3 Grade View: Grade II Tube type: Oral Tube size: 8.5 mm Number of attempts: 1 Airway Equipment and Method: Stylet and Oral airway Placement Confirmation: ETT inserted through vocal cords under direct vision, positive ETCO2 and breath sounds checked- equal and bilateral Secured at: 22 cm Tube secured with: Tape Dental Injury: Teeth and Oropharynx as per pre-operative assessment  Comments: By Morrie Sheldon, SRNA

## 2022-06-30 NOTE — Op Note (Signed)
Video Bronchoscopy with Endobronchial Ultrasound Procedure Note  Date of Operation: 06/30/2022  Pre-op Diagnosis: Lung mass, adenopathy  Post-op Diagnosis: Lung mass, adenopathy  Surgeon: [name], DO  Assistants: None   Anesthesia: General endotracheal anesthesia  Operation: Flexible video fiberoptic bronchoscopy with endobronchial ultrasound and biopsies.  Estimated Blood Loss: Minimal  Complications: None   Indications and History: Sheena [name] is a 77 y.o. female with lung mass, adenopathy.  The risks, benefits, complications, treatment options and expected outcomes were discussed with the patient.  The possibilities of pneumothorax, pneumonia, reaction to medication, pulmonary aspiration, perforation of a viscus, bleeding, failure to diagnose a condition and creating a complication requiring transfusion or operation were discussed with the patient who freely signed the consent.    Description of Procedure: The patient was examined in the preoperative area and history and data from the preprocedure consultation were reviewed. It was deemed appropriate to proceed.  The patient was taken to [facility] endoscopy room 3, identified as Sheena [name] and the procedure verified as Flexible Video Fiberoptic Bronchoscopy.  A Time Out was held and the above information confirmed. After being taken to the operating room general anesthesia was initiated and the patient  was orally intubated. The video fiberoptic bronchoscope was introduced via the endotracheal tube and a general inspection was performed which showed normal right and left lung anatomy no evidence of endobronchial lesion. The standard scope was then withdrawn and the endobronchial ultrasound was used to identify and characterize the peritracheal, hilar and bronchial lymph nodes. Inspection showed enlarged subcarinal adenopathy. Using real-time ultrasound guidance Wang needle biopsies were take from Station 7 nodes and were  sent for cytology. The patient tolerated the procedure well without apparent complications. There was no significant blood loss. The bronchoscope was withdrawn. Anesthesia was reversed and the patient was taken to the PACU for recovery.   Samples: 1. Wang needle biopsies from station 7 node  Plans:  The patient will be discharged from the PACU to home when recovered from anesthesia. We will review the cytology, pathology results with the patient when they become available. Outpatient followup will be with [name], DO.   [name], DO [facility] 06/30/2022 3:17 PM

## 2022-06-30 NOTE — Anesthesia Postprocedure Evaluation (Signed)
Anesthesia Post Note  Patient: Margrett Kalb  Procedure(s) Performed: VIDEO BRONCHOSCOPY WITH ENDOBRONCHIAL ULTRASOUND ROBOTIC ASSISTED NAVIGATIONAL BRONCHOSCOPY BRONCHIAL NEEDLE ASPIRATION BIOPSIES     Patient location during evaluation: PACU Anesthesia Type: General Level of consciousness: awake and alert Pain management: pain level controlled Vital Signs Assessment: post-procedure vital signs reviewed and stable Respiratory status: spontaneous breathing, nonlabored ventilation, respiratory function stable and patient connected to nasal cannula oxygen Cardiovascular status: blood pressure returned to baseline and stable Postop Assessment: no apparent nausea or vomiting Anesthetic complications: no  No notable events documented.  Last Vitals:  Vitals:   06/30/22 1229 06/30/22 1520  BP: (!) 163/64 (!) 98/56  Pulse: 89 61  Resp: 18 14  Temp: 36.4 C (!) 36.4 C  SpO2: 94% 100%    Last Pain:  Vitals:   06/30/22 1520  TempSrc:   PainSc: 0-No pain                 Ayden Apodaca S

## 2022-06-30 NOTE — Interval H&P Note (Signed)
History and Physical Interval Note:  06/30/2022 12:34 PM  Sheena [name]  has presented today for surgery, with the diagnosis of lung nodule.  The various methods of treatment have been discussed with the patient and family. After consideration of risks, benefits and other options for treatment, the patient has consented to  Procedure(s): VIDEO BRONCHOSCOPY WITH ENDOBRONCHIAL ULTRASOUND (N/A) ROBOTIC ASSISTED NAVIGATIONAL BRONCHOSCOPY (N/A) as a surgical intervention.  The patient's history has been reviewed, patient examined, no change in status, stable for surgery.  I have reviewed the patient's chart and labs.  Questions were answered to the patient's satisfaction.     Rachel Bo Ashmi Blas

## 2022-06-30 NOTE — Transfer of Care (Signed)
Immediate Anesthesia Transfer of Care Note  Patient: Sheena [name]  Procedure(s) Performed: VIDEO BRONCHOSCOPY WITH ENDOBRONCHIAL ULTRASOUND ROBOTIC ASSISTED NAVIGATIONAL BRONCHOSCOPY BRONCHIAL NEEDLE ASPIRATION BIOPSIES  Patient Location: PACU  Anesthesia Type:General  Level of Consciousness: awake, alert , and oriented  Airway & Oxygen Therapy: Patient Spontanous Breathing  Post-op Assessment: Report given to RN and Post -op Vital signs reviewed and stable  Post vital signs: Reviewed and stable  Last Vitals:  Vitals Value Taken Time  BP 98/56 06/30/22 1521  Temp 36.4 C 06/30/22 1520  Pulse 69 06/30/22 1529  Resp 23 06/30/22 1529  SpO2 94 % 06/30/22 1529  Vitals shown include unvalidated device data.  Last Pain:  Vitals:   06/30/22 1520  TempSrc:   PainSc: 0-No pain      Patients Stated Pain Goal: 0 (06/30/22 1240)  Complications: No notable events documented.

## 2022-07-02 ENCOUNTER — Encounter (HOSPITAL_COMMUNITY): Payer: Self-pay | Admitting: Pulmonary Disease

## 2022-07-03 LAB — CYTOLOGY - NON PAP

## 2022-07-04 NOTE — Progress Notes (Signed)
Sheena [name], patient seeing you next week.  Looks like new diagnosis of small cell carcinoma will need an appointment to see Dr. Shirline Frees.  Thanks,  BLI  [name], DO [facility] 07/04/2022 4:45 PM

## 2022-07-07 ENCOUNTER — Ambulatory Visit (INDEPENDENT_AMBULATORY_CARE_PROVIDER_SITE_OTHER): Payer: 59 | Admitting: Acute Care

## 2022-07-07 ENCOUNTER — Encounter: Payer: Self-pay | Admitting: Acute Care

## 2022-07-07 VITALS — BP 104/62 | HR 82 | Temp 98.3°F | Ht 60.0 in | Wt 104.0 lb

## 2022-07-07 DIAGNOSIS — C3490 Malignant neoplasm of unspecified part of unspecified bronchus or lung: Secondary | ICD-10-CM | POA: Diagnosis not present

## 2022-07-07 DIAGNOSIS — Z87891 Personal history of nicotine dependence: Secondary | ICD-10-CM

## 2022-07-07 NOTE — Patient Instructions (Addendum)
It is good to see you today. Your biopsy result came back as a small cell lung cancer. I will refer you today to medical oncology.  Dr. Arbutus Ped and his team will take excellent care of you.  You have an appointment with Dr. Arbutus Ped tomorrow , 11:15 for labs and 11:45 with Dr. Arbutus Ped. Follow up here as needed. Let us know if you need [place].
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
## 2022-07-07 NOTE — Progress Notes (Signed)
NN called pt to make self introduction and discuss her consult appt with Dr Arbutus Ped tomorrow at 11:45. Pt is aware of the locations of the cancer center and has a means of transportation. Pt is aware that she has a lab appt @ 11:15 and informed that the lab is in the cancer center. Pt denied questions or concerns at the time of phone call. NN provided pt with direct number to call in the event she has any questions.

## 2022-07-08 ENCOUNTER — Inpatient Hospital Stay (HOSPITAL_BASED_OUTPATIENT_CLINIC_OR_DEPARTMENT_OTHER): Payer: 59 | Admitting: Internal Medicine

## 2022-07-08 ENCOUNTER — Encounter: Payer: Self-pay | Admitting: Internal Medicine

## 2022-07-08 ENCOUNTER — Other Ambulatory Visit: Payer: Self-pay

## 2022-07-08 ENCOUNTER — Inpatient Hospital Stay: Payer: 59 | Attending: Internal Medicine

## 2022-07-08 VITALS — BP 140/70 | HR 69 | Temp 97.7°F | Resp 17 | Wt 104.8 lb

## 2022-07-08 DIAGNOSIS — C3490 Malignant neoplasm of unspecified part of unspecified bronchus or lung: Secondary | ICD-10-CM

## 2022-07-08 DIAGNOSIS — Z87891 Personal history of nicotine dependence: Secondary | ICD-10-CM | POA: Diagnosis not present

## 2022-07-08 DIAGNOSIS — C778 Secondary and unspecified malignant neoplasm of lymph nodes of multiple regions: Secondary | ICD-10-CM | POA: Diagnosis not present

## 2022-07-08 DIAGNOSIS — C3432 Malignant neoplasm of lower lobe, left bronchus or lung: Secondary | ICD-10-CM | POA: Insufficient documentation

## 2022-07-08 DIAGNOSIS — Z803 Family history of malignant neoplasm of breast: Secondary | ICD-10-CM

## 2022-07-08 DIAGNOSIS — Z5189 Encounter for other specified aftercare: Secondary | ICD-10-CM | POA: Diagnosis not present

## 2022-07-08 DIAGNOSIS — C801 Malignant (primary) neoplasm, unspecified: Secondary | ICD-10-CM

## 2022-07-08 DIAGNOSIS — Z5111 Encounter for antineoplastic chemotherapy: Secondary | ICD-10-CM | POA: Diagnosis not present

## 2022-07-08 DIAGNOSIS — Z79899 Other long term (current) drug therapy: Secondary | ICD-10-CM | POA: Insufficient documentation

## 2022-07-08 LAB — COMPREHENSIVE METABOLIC PANEL
ALT: 7 U/L (ref 0–44)
AST: 17 U/L (ref 15–41)
Albumin: 4.5 g/dL (ref 3.5–5.0)
Alkaline Phosphatase: 64 U/L (ref 38–126)
Anion gap: 4 — ABNORMAL LOW (ref 5–15)
BUN: 13 mg/dL (ref 8–23)
CO2: 32 mmol/L (ref 22–32)
Calcium: 9.7 mg/dL (ref 8.9–10.3)
Chloride: 102 mmol/L (ref 98–111)
Creatinine, Ser: 1.03 mg/dL — ABNORMAL HIGH (ref 0.44–1.00)
GFR, Estimated: 57 mL/min — ABNORMAL LOW (ref >60)
Glucose, Bld: 107 mg/dL — ABNORMAL HIGH (ref 70–99)
Potassium: 4.7 mmol/L (ref 3.5–5.1)
Sodium: 138 mmol/L (ref 135–145)
Total Bilirubin: 0.4 mg/dL (ref 0.3–1.2)
Total Protein: 7.6 g/dL (ref 6.5–8.1)

## 2022-07-08 LAB — CBC WITH DIFFERENTIAL/PLATELET
Abs Immature Granulocytes: 0.02 10*3/uL (ref 0.00–0.07)
Basophils Absolute: 0.1 10*3/uL (ref 0.0–0.1)
Basophils Relative: 1 %
Eosinophils Absolute: 0.3 10*3/uL (ref 0.0–0.5)
Eosinophils Relative: 3 %
HCT: 38.2 % (ref 36.0–46.0)
Hemoglobin: 12.4 g/dL (ref 12.0–15.0)
Immature Granulocytes: 0 %
Lymphocytes Relative: 23 %
Lymphs Abs: 2.3 10*3/uL (ref 0.7–4.0)
MCH: 32.2 pg (ref 26.0–34.0)
MCHC: 32.5 g/dL (ref 30.0–36.0)
MCV: 99.2 fL (ref 80.0–100.0)
Monocytes Absolute: 0.9 10*3/uL (ref 0.1–1.0)
Monocytes Relative: 9 %
Neutro Abs: 6.4 10*3/uL (ref 1.7–7.7)
Neutrophils Relative %: 64 %
Platelets: 280 10*3/uL (ref 150–400)
RBC: 3.85 MIL/uL — ABNORMAL LOW (ref 3.87–5.11)
RDW: 11.8 % (ref 11.5–15.5)
WBC: 10 10*3/uL (ref 4.0–10.5)
nRBC: 0 % (ref 0.0–0.2)

## 2022-07-08 MED ORDER — PROCHLORPERAZINE MALEATE 10 MG PO TABS: 10.0000 mg | ORAL_TABLET | Freq: Four times a day (QID) | ORAL | 0 refills | Status: DC | PRN

## 2022-07-08 NOTE — Progress Notes (Signed)
Pt notified that NN has scheduled her for a Brain MRI tomorrow 5/9 at 7:00pm at [facility]. Pt aware that she needs to enter [facility] through the ER by 6:45pm and let the front desk know she is there for an MRI.
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
## 2022-07-08 NOTE — Progress Notes (Signed)
START ON PATHWAY REGIMEN - Small Cell Lung     A cycle is every 21 days:     Carboplatin      Etoposide   **Always confirm dose/schedule in your pharmacy ordering system**  Patient Characteristics: Newly Diagnosed, Preoperative or Nonsurgical Candidate (Clinical Staging), First Line, Limited Stage, Nonsurgical Candidate Therapeutic Status: Newly Diagnosed, Preoperative or Nonsurgical Candidate (Clinical Staging) AJCC T Category: cT2a AJCC N Category: cN2 AJCC M Category: cM0 AJCC 8 Stage Grouping: IIIA Stage Classification: Limited Surgical Candidacy: Nonsurgical Candidate Intent of Therapy: Curative Intent, Discussed with Patient 
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
## 2022-07-09 ENCOUNTER — Telehealth: Payer: Self-pay | Admitting: Internal Medicine

## 2022-07-09 ENCOUNTER — Ambulatory Visit (HOSPITAL_COMMUNITY)
Admission: RE | Admit: 2022-07-09 | Discharge: 2022-07-09 | Disposition: A | Discharge status: Home / Self Care | Payer: 59 | Source: Ambulatory Visit | Attending: Internal Medicine | Admitting: Internal Medicine

## 2022-07-09 ENCOUNTER — Other Ambulatory Visit: Payer: Self-pay

## 2022-07-09 DIAGNOSIS — C3490 Malignant neoplasm of unspecified part of unspecified bronchus or lung: Secondary | ICD-10-CM | POA: Diagnosis not present

## 2022-07-09 MED ORDER — GADOBUTROL 1 MMOL/ML IV SOLN
5.0000 mL | Freq: Once | INTRAVENOUS | Status: AC | PRN
  Administered 2022-07-09: 5 mL via INTRAVENOUS

## 2022-07-09 NOTE — Telephone Encounter (Signed)
Scheduled per 05/08 wq, patient has been called and notified of upcoming appointments.

## 2022-07-10 ENCOUNTER — Other Ambulatory Visit: Payer: Self-pay

## 2022-07-11 ENCOUNTER — Other Ambulatory Visit: Payer: Self-pay | Admitting: Family Medicine

## 2022-07-13 MED FILL — Fosaprepitant Dimeglumine For IV Infusion 150 MG (Base Eq): INTRAVENOUS | Qty: 5 | Status: AC

## 2022-07-13 MED FILL — Dexamethasone Sodium Phosphate Inj 100 MG/10ML: INTRAMUSCULAR | Qty: 1 | Status: AC

## 2022-07-13 NOTE — Progress Notes (Signed)
Thoracic Location of Tumor / Histology: Left Lower Lobe Lung  Patient presented to the ER in January 2024 with complaints of back pain that radiates to the anterior part of the left side of her chest.  Bronchoscopy 06/30/2022  CT Super D Chest 06/12/2022: Left lower lobe lung mass, tracer avid on today's PET-CT measures 3.0 x 2.0 cm. Adjacent small nodules may represent areas of mucoid impaction and/or satellite lesions.  Tracer avid mediastinal and left hilar lymph nodes are identified  PET 06/12/2022: 2.8 cm hypermetabolic left lower lobe pulmonary nodule, highly suspicious for bronchogenic carcinoma.  Hypermetabolic lymphadenopathy in left hilum, subcarinal, AP window, and pretracheal regions, consistent with metastatic disease.  No evidence of metastatic disease within the neck, abdomen, or pelvis.  CT T Spine 03/07/2022: compression deformities of T5 and T11 are new compared to 11/26/2015 but appear chroni  CTA 03/07/2022: Left lower lobe pulmonary nodule measuring up to 1.6 cm.   Biopsies of Lymph node, station 7 06/30/2022    Tobacco/Marijuana/Snuff/ETOH use:   Past/Anticipated interventions by cardiothoracic surgery, if any:   Past/Anticipated interventions by medical oncology, if any:  Dr. Arbutus Ped 07/08/2022 - I recommended for the patient a course of systemic chemotherapy with carboplatin for AUC of 5 on day 1 and 2 etoposide 100 Mg/M2 on days 1, 2 and 3 every 3 weeks. This will be concurrent with radiotherapy if the patient has no evidence of metastatic disease to the brain.  -She is expected to start the first cycle of this treatment next week.    Signs/Symptoms Weight changes, if any:  Respiratory complaints, if any:  Hemoptysis, if any:  Pain issues, if any:    SAFETY ISSUES: Prior radiation?  Pacemaker/ICD?   Possible current pregnancy? Postmenopausal Is the patient on methotrexate?   Current Complaints / other details:

## 2022-07-14 ENCOUNTER — Inpatient Hospital Stay: Payer: 59

## 2022-07-14 ENCOUNTER — Other Ambulatory Visit: Payer: Self-pay

## 2022-07-14 ENCOUNTER — Other Ambulatory Visit: Payer: 59

## 2022-07-14 ENCOUNTER — Encounter (HOSPITAL_COMMUNITY): Payer: Self-pay

## 2022-07-14 VITALS — BP 153/63 | HR 82 | Temp 98.2°F | Resp 18 | Wt 103.5 lb

## 2022-07-14 DIAGNOSIS — C3432 Malignant neoplasm of lower lobe, left bronchus or lung: Secondary | ICD-10-CM

## 2022-07-14 DIAGNOSIS — Z5189 Encounter for other specified aftercare: Secondary | ICD-10-CM | POA: Diagnosis not present

## 2022-07-14 DIAGNOSIS — Z5111 Encounter for antineoplastic chemotherapy: Secondary | ICD-10-CM | POA: Diagnosis not present

## 2022-07-14 DIAGNOSIS — Z87891 Personal history of nicotine dependence: Secondary | ICD-10-CM | POA: Diagnosis not present

## 2022-07-14 DIAGNOSIS — Z803 Family history of malignant neoplasm of breast: Secondary | ICD-10-CM | POA: Diagnosis not present

## 2022-07-14 DIAGNOSIS — C778 Secondary and unspecified malignant neoplasm of lymph nodes of multiple regions: Secondary | ICD-10-CM | POA: Diagnosis not present

## 2022-07-14 DIAGNOSIS — Z79899 Other long term (current) drug therapy: Secondary | ICD-10-CM | POA: Diagnosis not present

## 2022-07-14 LAB — CMP (CANCER CENTER ONLY)
ALT: 8 U/L (ref 0–44)
AST: 18 U/L (ref 15–41)
Albumin: 4.6 g/dL (ref 3.5–5.0)
Alkaline Phosphatase: 63 U/L (ref 38–126)
Anion gap: 12 (ref 5–15)
BUN: 11 mg/dL (ref 8–23)
CO2: 25 mmol/L (ref 22–32)
Calcium: 9.9 mg/dL (ref 8.9–10.3)
Chloride: 102 mmol/L (ref 98–111)
Creatinine: 1.12 mg/dL — ABNORMAL HIGH (ref 0.44–1.00)
GFR, Estimated: 51 mL/min — ABNORMAL LOW (ref >60)
Glucose, Bld: 148 mg/dL — ABNORMAL HIGH (ref 70–99)
Potassium: 3.9 mmol/L (ref 3.5–5.1)
Sodium: 139 mmol/L (ref 135–145)
Total Bilirubin: 0.4 mg/dL (ref 0.3–1.2)
Total Protein: 7.8 g/dL (ref 6.5–8.1)

## 2022-07-14 LAB — CBC WITH DIFFERENTIAL (CANCER CENTER ONLY)
Abs Immature Granulocytes: 0.03 10*3/uL (ref 0.00–0.07)
Basophils Absolute: 0.1 10*3/uL (ref 0.0–0.1)
Basophils Relative: 1 %
Eosinophils Absolute: 0.2 10*3/uL (ref 0.0–0.5)
Eosinophils Relative: 1 %
HCT: 38.9 % (ref 36.0–46.0)
Hemoglobin: 12.7 g/dL (ref 12.0–15.0)
Immature Granulocytes: 0 %
Lymphocytes Relative: 19 %
Lymphs Abs: 2.2 10*3/uL (ref 0.7–4.0)
MCH: 32.3 pg (ref 26.0–34.0)
MCHC: 32.6 g/dL (ref 30.0–36.0)
MCV: 99 fL (ref 80.0–100.0)
Monocytes Absolute: 0.8 10*3/uL (ref 0.1–1.0)
Monocytes Relative: 6 %
Neutro Abs: 8.5 10*3/uL — ABNORMAL HIGH (ref 1.7–7.7)
Neutrophils Relative %: 73 %
Platelet Count: 296 10*3/uL (ref 150–400)
RBC: 3.93 MIL/uL (ref 3.87–5.11)
RDW: 11.7 % (ref 11.5–15.5)
WBC Count: 11.7 10*3/uL — ABNORMAL HIGH (ref 4.0–10.5)
nRBC: 0 % (ref 0.0–0.2)

## 2022-07-14 MED ORDER — SODIUM CHLORIDE 0.9 % IV SOLN
304.5000 mg | Freq: Once | INTRAVENOUS | Status: AC
  Administered 2022-07-14: 300 mg via INTRAVENOUS
  Filled 2022-07-14: qty 30

## 2022-07-14 MED ORDER — SODIUM CHLORIDE 0.9 % IV SOLN
100.0000 mg/m2 | Freq: Once | INTRAVENOUS | Status: AC
  Administered 2022-07-14: 142 mg via INTRAVENOUS
  Filled 2022-07-14: qty 7.1

## 2022-07-14 MED ORDER — SODIUM CHLORIDE 0.9 % IV SOLN: Freq: Once | INTRAVENOUS | Status: AC

## 2022-07-14 MED ORDER — SODIUM CHLORIDE 0.9 % IV SOLN
150.0000 mg | Freq: Once | INTRAVENOUS | Status: AC
  Administered 2022-07-14: 150 mg via INTRAVENOUS
  Filled 2022-07-14: qty 150

## 2022-07-14 MED ORDER — PALONOSETRON HCL INJECTION 0.25 MG/5ML
0.2500 mg | Freq: Once | INTRAVENOUS | Status: AC
  Administered 2022-07-14: 0.25 mg via INTRAVENOUS
  Filled 2022-07-14: qty 5

## 2022-07-14 MED ORDER — SODIUM CHLORIDE 0.9 % IV SOLN
10.0000 mg | Freq: Once | INTRAVENOUS | Status: AC
  Administered 2022-07-14: 10 mg via INTRAVENOUS
  Filled 2022-07-14: qty 10

## 2022-07-14 MED FILL — Dexamethasone Sodium Phosphate Inj 100 MG/10ML: INTRAMUSCULAR | Qty: 1 | Status: AC

## 2022-07-14 NOTE — Patient Instructions (Signed)
[facility]  Discharge Instructions: Thank you for choosing [facility] to provide your oncology and hematology care.   If you have a lab appointment with the Cancer Center, please go directly to the Cancer Center and check in at the registration area.   Wear comfortable clothing and clothing appropriate for easy access to any Portacath or PICC line.   We strive to give you quality time with your provider. You may need to reschedule your appointment if you arrive late (15 or more minutes).  Arriving late affects you and other patients whose appointments are after yours.  Also, if you miss three or more appointments without notifying the office, you may be dismissed from the clinic at the provider's discretion.      For prescription refill requests, have your pharmacy contact our office and allow 72 hours for refills to be completed.    Today you received the following chemotherapy and/or immunotherapy agents: Carboplatin, Etoposide      To help prevent nausea and vomiting after your treatment, we encourage you to take your nausea medication as directed.  BELOW ARE SYMPTOMS THAT SHOULD BE REPORTED IMMEDIATELY: *FEVER GREATER THAN 100.4 F (38 C) OR HIGHER *CHILLS OR SWEATING *NAUSEA AND VOMITING THAT IS NOT CONTROLLED WITH YOUR NAUSEA MEDICATION *UNUSUAL SHORTNESS OF BREATH *UNUSUAL BRUISING OR BLEEDING *URINARY PROBLEMS (pain or burning when urinating, or frequent urination) *BOWEL PROBLEMS (unusual diarrhea, constipation, pain near the anus) TENDERNESS IN MOUTH AND THROAT WITH OR WITHOUT PRESENCE OF ULCERS (sore throat, sores in mouth, or a toothache) UNUSUAL RASH, SWELLING OR PAIN  UNUSUAL VAGINAL DISCHARGE OR ITCHING   Items with * indicate a potential emergency and should be followed up as soon as possible or go to the Emergency Department if any problems should occur.  Please show the CHEMOTHERAPY ALERT CARD or IMMUNOTHERAPY ALERT  CARD at check-in to the Emergency Department and triage nurse.  Should you have questions after your visit or need to cancel or reschedule your appointment, please contact [facility]  Dept: [phone]  and follow the prompts.  Office hours are 8:00 a.m. to 4:30 p.m. Monday - Friday. Please note that voicemails left after 4:00 p.m. may not be returned until the following business day.  We are closed weekends and major holidays. You have access to a nurse at all times for urgent questions. Please call the main number to the clinic Dept: [phone] and follow the prompts.   For any non-urgent questions, you may also contact your provider using MyChart. We now offer e-Visits for anyone 18 and older to request care online for non-urgent symptoms. For details visit [email or web address].   Also download the MyChart app! Go to the app store, search "MyChart", open the app, select [place], and log in with your MyChart username and password.  Carboplatin Injection What is this medication? CARBOPLATIN (KAR boe pla tin) treats some types of cancer. It works by slowing down the growth of cancer cells. This medicine may be used for other purposes; ask your health care provider or pharmacist if you have questions. COMMON BRAND NAME(S): Paraplatin What should I tell my care team before I take this medication? They need to know if you have any of these conditions: Blood disorders Hearing problems Kidney disease Recent or ongoing radiation therapy An unusual or allergic reaction to carboplatin, cisplatin, other medications, foods, dyes, or preservatives Pregnant or trying to get pregnant Breast-feeding How  should I use this medication? This medication is injected into a vein. It is given by your care team in a hospital or clinic setting. Talk to your care team about the use of this medication in children. Special care may be needed. Overdosage: If you think  you have taken too much of this medicine contact a poison control center or emergency room at once. NOTE: This medicine is only for you. Do not share this medicine with others. What if I miss a dose? Keep appointments for follow-up doses. It is important not to miss your dose. Call your care team if you are unable to keep an appointment. What may interact with this medication? Medications for seizures Some antibiotics, such as amikacin, gentamicin, neomycin, streptomycin, tobramycin Vaccines This list may not describe all possible interactions. Give your health care provider a list of all the medicines, herbs, non-prescription drugs, or dietary supplements you use. Also tell them if you smoke, drink alcohol, or use illegal drugs. Some items may interact with your medicine. What should I watch for while using this medication? Your condition will be monitored carefully while you are receiving this medication. You may need blood work while taking this medication. This medication may make you feel generally unwell. This is not uncommon, as chemotherapy can affect healthy cells as well as cancer cells. Report any side effects. Continue your course of treatment even though you feel ill unless your care team tells you to stop. In some cases, you may be given additional medications to help with side effects. Follow all directions for their use. This medication may increase your risk of getting an infection. Call your care team for advice if you get a fever, chills, sore throat, or other symptoms of a cold or flu. Do not treat yourself. Try to avoid being around people who are sick. Avoid taking medications that contain aspirin, acetaminophen, ibuprofen, naproxen, or ketoprofen unless instructed by your care team. These medications may hide a fever. Be careful brushing or flossing your teeth or using a toothpick because you may get an infection or bleed more easily. If you have any dental work done, tell your  dentist you are receiving this medication. Talk to your care team if you wish to become pregnant or think you might be pregnant. This medication can cause serious birth defects. Talk to your care team about effective forms of contraception. Do not breast-feed while taking this medication. What side effects may I notice from receiving this medication? Side effects that you should report to your care team as soon as possible: Allergic reactions--skin rash, itching, hives, swelling of the face, lips, tongue, or throat Infection--fever, chills, cough, sore throat, wounds that don't heal, pain or trouble when passing urine, general feeling of discomfort or being unwell Low red blood cell level--unusual weakness or fatigue, dizziness, headache, trouble breathing Pain, tingling, or numbness in the hands or feet, muscle weakness, change in vision, confusion or trouble speaking, loss of balance or coordination, trouble walking, seizures Unusual bruising or bleeding Side effects that usually do not require medical attention (report to your care team if they continue or are bothersome): Hair loss Nausea Unusual weakness or fatigue Vomiting This list may not describe all possible side effects. Call your doctor for medical advice about side effects. You may report side effects to FDA at 1-800-FDA-1088. Where should I keep my medication? This medication is given in a hospital or clinic. It will not be stored at home. NOTE: This sheet is a  summary. It may not cover all possible information. If you have questions about this medicine, talk to your doctor, pharmacist, or health care provider.  2023 Elsevier/Gold Standard (2007-04-09 00:00:00)  Etoposide Capsules What is this medication? ETOPOSIDE (e toe POE side) treats lung cancer. It works by slowing down the growth of cancer cells. This medicine may be used for other purposes; ask your health care provider or pharmacist if you have questions. COMMON BRAND  NAME(S): VePesid What should I tell my care team before I take this medication? They need to know if you have any of these conditions: Infection Kidney disease Liver disease Low blood counts, such as low white cell, platelet, red cell counts An unusual or allergic reaction to etoposide, other medications, foods, dyes, or preservatives Pregnant or trying to get pregnant Breastfeeding How should I use this medication? Take this medication by mouth with a glass of water. Take it as directed on the prescription label. Do not cut, crush, or chew this medication. Swallow the capsules whole. Do not take it more often than directed. Keep taking it unless your care team tells you to stop. Handling this medication may be harmful. Wear gloves while touching this medication or bottle. Talk to your care team about how to handle this medication. Special instructions may apply. Talk to your care team about the use of this medication in children. Special care may be needed. Overdosage: If you think you have taken too much of this medicine contact a poison control center or emergency room at once. NOTE: This medicine is only for you. Do not share this medicine with others. What if I miss a dose? If you miss a dose, take it as soon as you can. If it is almost time for your next dose, take only that dose. Do not take double or extra doses. What may interact with this medication? Cyclosporine Warfarin This list may not describe all possible interactions. Give your health care provider a list of all the medicines, herbs, non-prescription drugs, or dietary supplements you use. Also tell them if you smoke, drink alcohol, or use illegal drugs. Some items may interact with your medicine. What should I watch for while using this medication? Your condition will be monitored carefully while you are receiving this medication. This medication may make you feel generally unwell. This is not uncommon as chemotherapy can  affect healthy cells as well as cancer cells. Report any side effects. Continue your course of treatment even though you feel ill unless your care team tells you to stop. This medication may increase your risk of getting an infection. Call your care team for advice if you get a fever, chills, sore throat, or other symptoms of a cold or flu. Do not treat yourself. Try to avoid being around people who are sick. This medication may increase your risk to bruise or bleed. Call your care team if you notice any unusual bleeding. Talk to your care team about your risk of cancer. You may be more at risk for certain types of cancers if you take this medication. Talk to your care team if you may be pregnant. Serious birth defects can occur if you take this medication during pregnancy and for 6 months after the last dose. You will need a negative pregnancy test before starting this medication. Contraception is recommended while taking this medication and for 6 months after the last dose. Your care team can help you find the option that works for you. If your partner can  get pregnant, use a condom during sex while taking this medication and for 4 months after the last dose. Do not breastfeed while taking this medication. This medication may cause infertility. Talk to your care team if you are concerned about your fertility. What side effects may I notice from receiving this medication? Side effects that you should report to your care team as soon as possible: Allergic reactions--skin rash, itching, hives, swelling of the face, lips, tongue, or throat Infection--fever, chills, cough, sore throat, wounds that don't heal, pain or trouble when passing urine, general feeling of discomfort or being unwell Low red blood cell level--unusual weakness or fatigue, dizziness, headache, trouble breathing Unusual bruising or bleeding Side effects that usually do not require medical attention (report to your care team if they  continue or are bothersome): Diarrhea Fatigue Hair loss Loss of appetite Nausea Pain, redness, or swelling with sores inside the mouth or throat Vomiting This list may not describe all possible side effects. Call your doctor for medical advice about side effects. You may report side effects to FDA at 1-800-FDA-1088. Where should I keep my medication? Keep out of the reach of children and pets. Store in a refrigerator between 2 and 8 degrees C (36 and 46 degrees F). Do not freeze. Get rid any unused medication after the expiration date. To get rid of medications that are no longer needed or have expired: Take the medication to a medication take-back program. Check with your pharmacy or law enforcement to find a location. If you cannot return the medication, ask your pharmacist or care team how to get rid of this medication safely. NOTE: This sheet is a summary. It may not cover all possible information. If you have questions about this medicine, talk to your doctor, pharmacist, or health care provider.  2023 Elsevier/Gold Standard (2021-07-09 00:00:00)

## 2022-07-15 ENCOUNTER — Encounter: Payer: Self-pay | Admitting: Radiation Oncology

## 2022-07-15 ENCOUNTER — Inpatient Hospital Stay: Payer: 59

## 2022-07-15 ENCOUNTER — Other Ambulatory Visit: Payer: Self-pay | Admitting: Medical Oncology

## 2022-07-15 ENCOUNTER — Ambulatory Visit
Admission: RE | Admit: 2022-07-15 | Discharge: 2022-07-15 | Disposition: A | Discharge status: Home / Self Care | Payer: 59 | Source: Ambulatory Visit | Attending: Radiation Oncology | Admitting: Radiation Oncology

## 2022-07-15 ENCOUNTER — Encounter: Payer: Self-pay | Admitting: Internal Medicine

## 2022-07-15 VITALS — BP 122/54 | HR 77 | Temp 96.6°F | Resp 18 | Ht 60.0 in | Wt 105.1 lb

## 2022-07-15 DIAGNOSIS — I10 Essential (primary) hypertension: Secondary | ICD-10-CM | POA: Diagnosis not present

## 2022-07-15 DIAGNOSIS — C3432 Malignant neoplasm of lower lobe, left bronchus or lung: Secondary | ICD-10-CM | POA: Diagnosis not present

## 2022-07-15 DIAGNOSIS — Z79899 Other long term (current) drug therapy: Secondary | ICD-10-CM | POA: Insufficient documentation

## 2022-07-15 DIAGNOSIS — R011 Cardiac murmur, unspecified: Secondary | ICD-10-CM | POA: Diagnosis not present

## 2022-07-15 DIAGNOSIS — Z803 Family history of malignant neoplasm of breast: Secondary | ICD-10-CM | POA: Diagnosis not present

## 2022-07-15 DIAGNOSIS — I878 Other specified disorders of veins: Secondary | ICD-10-CM

## 2022-07-15 DIAGNOSIS — Z5111 Encounter for antineoplastic chemotherapy: Secondary | ICD-10-CM | POA: Diagnosis not present

## 2022-07-15 DIAGNOSIS — C778 Secondary and unspecified malignant neoplasm of lymph nodes of multiple regions: Secondary | ICD-10-CM | POA: Diagnosis not present

## 2022-07-15 DIAGNOSIS — Z87891 Personal history of nicotine dependence: Secondary | ICD-10-CM | POA: Diagnosis not present

## 2022-07-15 DIAGNOSIS — Z5189 Encounter for other specified aftercare: Secondary | ICD-10-CM | POA: Diagnosis not present

## 2022-07-15 MED ORDER — SODIUM CHLORIDE 0.9 % IV SOLN: Freq: Once | INTRAVENOUS | Status: AC

## 2022-07-15 MED ORDER — SODIUM CHLORIDE 0.9 % IV SOLN
100.0000 mg/m2 | Freq: Once | INTRAVENOUS | Status: AC
  Administered 2022-07-15: 142 mg via INTRAVENOUS
  Filled 2022-07-15: qty 7.1

## 2022-07-15 MED ORDER — SODIUM CHLORIDE 0.9 % IV SOLN
10.0000 mg | Freq: Once | INTRAVENOUS | Status: AC
  Administered 2022-07-15: 10 mg via INTRAVENOUS
  Filled 2022-07-15: qty 10

## 2022-07-15 MED FILL — Dexamethasone Sodium Phosphate Inj 100 MG/10ML: INTRAMUSCULAR | Qty: 1 | Status: AC

## 2022-07-15 NOTE — Progress Notes (Signed)
Port a cath order sent to [facility].

## 2022-07-15 NOTE — Progress Notes (Signed)
Called pt to introduce myself as her Financial Resource Specialist and to discuss the [employer] grant.  I left a msg requesting she return my call if she's interested in applying for the grant.  

## 2022-07-15 NOTE — Progress Notes (Signed)
Radiation Oncology         [phone] ________________________________  Name: Sheena [name]        MRN: [id number]  Date of Service: 07/15/2022 DOB: 1945/07/16  WU:[id number], Aldine Contes, MD  Si Gaul, MD     REFERRING PHYSICIAN: Si Gaul, MD   DIAGNOSIS: The encounter diagnosis was Primary small cell carcinoma of lower lobe of left lung (HCC).   HISTORY OF PRESENT ILLNESS: Sheena [name] is a 77 y.o. female seen at the request of Dr. Tasia Catchings for a diagnosis of limited stage small cell lung cancer.  Patient presented with back pain in January 2024 that radiated into the left chest wall.  CT angiogram showed a left lower lobe pulmonary nodule measuring up to 1.6 cm.  She was ultimately referred to pulmonary and underwent PET scan and CT super D on 06/12/2022.  The PET scan showed a hypermetabolic left lower lobe nodule measuring 2.9 cm SUV of 9.1. There was hypermetabolic activity in teh left hilaum wiht an SUV of 8.5, and also in the AP window, pretracheal, and subcarinal regions. The subcarinal station's SUV was 10. Her bronchoscopy cytology showed small cell carcinoma in the station 7 node. She's met with Dr. Arbutus Ped to review recommendations and is scheduled to start chemotherapy today. She's also undergone an MRI on 07/09/22 and this did not show disease in the brain.     PREVIOUS RADIATION THERAPY: No   PAST MEDICAL HISTORY:  Past Medical History:  Diagnosis Date   Anxiety    Heart murmur    Hypertension    Scarlet fever with other complications childhood   "had to learn to work again" has had leg weakness       PAST SURGICAL HISTORY: Past Surgical History:  Procedure Laterality Date   BRONCHIAL NEEDLE ASPIRATION BIOPSY  06/30/2022   Procedure: BRONCHIAL NEEDLE ASPIRATION BIOPSIES;  Surgeon: [name], DO;  Location: [facility];  Service: Pulmonary;;   KYPHOPLASTY     2014   peridontal surgery     VIDEO BRONCHOSCOPY WITH ENDOBRONCHIAL ULTRASOUND N/A  06/30/2022   Procedure: VIDEO BRONCHOSCOPY WITH ENDOBRONCHIAL ULTRASOUND;  Surgeon: [name], DO;  Location: [facility] ENDOSCOPY;  Service: Pulmonary;  Laterality: N/A;     FAMILY HISTORY:  Family History  Problem Relation Age of Onset   Cancer Mother        breast, spine mets   Hypertension Mother    Heart disease Mother 47       CABG 4 vessel   COPD Father        emphysema   Heart disease Brother 88       MI     SOCIAL HISTORY:  reports that she quit smoking about 5 years ago. Her smoking use included cigarettes. She has a 45.00 pack-year smoking history. She has never used smokeless tobacco. She reports that she does not drink alcohol and does not use drugs. The patient is married and lives in [place].    ALLERGIES: Antihistamines, diphenhydramine-type; Aspirin; and Evista [raloxifene]   MEDICATIONS:  Current Outpatient Medications  Medication Sig Dispense Refill   acetaminophen (TYLENOL) 500 MG tablet Take 1,000 mg by mouth 2 (two) times daily as needed for mild pain. Reported on 06/04/2015     amLODipine (NORVASC) 5 MG tablet TAKE 1 TABLET (5 MG TOTAL) BY MOUTH DAILY. 90 tablet 3   aspirin EC 81 MG tablet Take 81 mg by mouth 2 (two) times a week.     atorvastatin (LIPITOR)  20 MG tablet TAKE 1 TABLET BY MOUTH EVERY DAY (Patient taking differently: Take 20 mg by mouth at bedtime.) 90 tablet 3   Calcium Citrate-Vitamin D (CALCIUM CITRATE PETITE/VIT D PO) Take 1 tablet by mouth 2 (two) times daily.     cyclobenzaprine (FLEXERIL) 5 MG tablet TAKE 1 TABLET BY MOUTH TWICE A DAY AS NEEDED FOR MUSCLE SPASM 30 tablet 1   famotidine (PEPCID) 20 MG tablet Take 20 mg by mouth daily as needed for heartburn or indigestion.     furosemide (LASIX) 40 MG tablet TAKE 1 TABLET BY MOUTH EVERY DAY 90 tablet 1   lidocaine (LIDODERM) 5 % Place 1 patch onto the skin daily. Remove & Discard patch within 12 hours or as directed by MD 5 patch 0   metoprolol tartrate (LOPRESSOR) 50 MG tablet TAKE 1  TABLET BY MOUTH TWICE A DAY 180 tablet 1   prochlorperazine (COMPAZINE) 10 MG tablet Take 1 tablet (10 mg total) by mouth every 6 (six) hours as needed for nausea or vomiting. 30 tablet 0   traMADol (ULTRAM) 50 MG tablet Take 0.5-1 tablets (25-50 mg total) by mouth 2 (two) times daily as needed. For chronic low back pain. May refill monthly 60 tablet 2   No current facility-administered medications for this encounter.   Facility-Administered Medications Ordered in Other Encounters  Medication Dose Route Frequency Provider Last Rate Last Admin   0.9 %  sodium chloride infusion   Intravenous Once Si Gaul, MD       dexamethasone (DECADRON) 10 mg in sodium chloride 0.9 % 50 mL IVPB  10 mg Intravenous Once Si Gaul, MD       etoposide (VEPESID) 142 mg in sodium chloride 0.9 % 500 mL chemo infusion  100 mg/m2 (Treatment Plan Recorded) Intravenous Once Si Gaul, MD         REVIEW OF SYSTEMS: On review of systems, the patient reports that she is doing well but her chest wall pain on the left is quite uncomfortable. She takes pain medication for this and finds this to be somewhat helpful. She has lost about 5 pounds unintentionally over the last month, and feels like she does well with her pulmonary function, with only shortness of breath climbing the stairs. She had some coughing and congestion since Covid which was around late December 2023. No hemoptysis has been noted. No other complaints are verbalized.     PHYSICAL EXAM:  Wt Readings from Last 3 Encounters:  07/15/22 105 lb 2 oz (47.7 kg)  07/14/22 103 lb 8 oz (46.9 kg)  07/08/22 104 lb 12.8 oz (47.5 kg)   Temp Readings from Last 3 Encounters:  07/15/22 (!) 96.6 F (35.9 C) (Temporal)  07/14/22 98.2 F (36.8 C) (Oral)  07/08/22 97.7 F (36.5 C) (Oral)   BP Readings from Last 3 Encounters:  07/15/22 (!) 122/54  07/14/22 (!) 153/63  07/08/22 (!) 140/70   Pulse Readings from Last 3 Encounters:  07/15/22 77   07/14/22 82  07/08/22 69   Pain Assessment Pain Score: 8  Pain Loc: Back/10  In general this is a well appearing caucasian female in no acute distress. She's alert and oriented x4 and appropriate throughout the examination. Cardiopulmonary assessment is negative for acute distress and she exhibits normal effort.     ECOG = 1  0 - Asymptomatic (Fully active, able to carry on all predisease activities without restriction)  1 - Symptomatic but completely ambulatory (Restricted in physically strenuous activity but ambulatory and  able to carry out work of a light or sedentary nature. For example, light housework, office work)  2 - Symptomatic, <50% in bed during the day (Ambulatory and capable of all self care but unable to carry out any work activities. Up and about more than 50% of waking hours)  3 - Symptomatic, >50% in bed, but not bedbound (Capable of only limited self-care, confined to bed or chair 50% or more of waking hours)  4 - Bedbound (Completely disabled. Cannot carry on any self-care. Totally confined to bed or chair)  5 - Death   Santiago Glad MM, Creech RH, Tormey DC, et al. [phone]). "Toxicity and response criteria of the [facility]". Am. Evlyn Clines. Oncol. 5 (6): 649-55    LABORATORY DATA:  Lab Results  Component Value Date   WBC 11.7 (H) 07/14/2022   HGB 12.7 07/14/2022   HCT 38.9 07/14/2022   MCV 99.0 07/14/2022   PLT 296 07/14/2022   Lab Results  Component Value Date   NA 139 07/14/2022   K 3.9 07/14/2022   CL 102 07/14/2022   CO2 25 07/14/2022   Lab Results  Component Value Date   ALT 8 07/14/2022   AST 18 07/14/2022   ALKPHOS 63 07/14/2022   BILITOT 0.4 07/14/2022      RADIOGRAPHY: MR BRAIN W WO CONTRAST  Result Date: 07/15/2022 CLINICAL DATA:  Small cell lung cancer (SCLC), staging EXAM: MRI HEAD WITHOUT AND WITH CONTRAST TECHNIQUE: Multiplanar, multiecho pulse sequences of the brain and surrounding structures were obtained  without and with intravenous contrast. CONTRAST:  5mL GADAVIST GADOBUTROL 1 MMOL/ML IV SOLN COMPARISON:  MRI April 06, 2011. FINDINGS: Brain: No acute infarction, hemorrhage, hydrocephalus, extra-axial collection or mass lesion. No pathologic enhancement. Vascular: Major arterial flow voids are maintained at the skull base. Skull and upper cervical spine: Normal marrow signal. Sinuses/Orbits: Clear sinuses.  No acute orbital findings. Other: No mastoid effusions. IMPRESSION: No evidence of acute intracranial abnormality or metastatic disease. Electronically Signed   By: Feliberto Harts M.D.   On: 07/15/2022 12:15       IMPRESSION/PLAN: 1. Limited Stage Small Cell Carcinoma of the LLL. Dr. Mitzi Hansen discusses the pathology findings and reviews the nature of locally advanced lung cancer. Dr. Mitzi Hansen recommends chemoradiation. She has just begun cycle #1 of therapy and we discussed we would recommend starting radiation with her second cycle. We discussed the risks, benefits, short, and long term effects of radiotherapy, as well as the curative intent, and the patient is interested in proceeding. Dr. Mitzi Hansen discusses the delivery and logistics of radiotherapy and anticipates a course of 6 1/2 weeks of radiotherapy to the LLL and regional nodes of the chest. Written consent is obtained and placed in the chart, a copy was provided to the patient. The patient will be contacted to coordinate treatment planning by our simulation department. We anticipate starting chemotherapy with cycle #2 on 08/04/22. We reviewed her Brain MRI as well and discussed that we would recommend prophylactic cranial irradiation (PCI) to reduce risks of brain disease as well. 2. PCI. As above we will revisit the role of preventative radiotherapy to the brain following completion of her chemoradiation.  In a visit lasting 60 minutes, greater than 50% of the time was spent face to face discussing the patient's condition, in preparation for the  discussion, and coordinating the patient's care.   The above documentation reflects my direct findings during this shared patient visit. Please see the separate note by Dr.  Moody on this date for the remainder of the patient's plan of care.    Osker Mason, [facility]   **Disclaimer: This note was dictated with voice recognition software. Similar sounding words can inadvertently be transcribed and this note may contain transcription errors which may not have been corrected upon publication of note.**

## 2022-07-16 ENCOUNTER — Inpatient Hospital Stay: Payer: 59

## 2022-07-16 VITALS — BP 124/56 | HR 77 | Temp 97.8°F | Resp 18

## 2022-07-16 DIAGNOSIS — C3432 Malignant neoplasm of lower lobe, left bronchus or lung: Secondary | ICD-10-CM

## 2022-07-16 DIAGNOSIS — Z5111 Encounter for antineoplastic chemotherapy: Secondary | ICD-10-CM | POA: Diagnosis not present

## 2022-07-16 DIAGNOSIS — Z87891 Personal history of nicotine dependence: Secondary | ICD-10-CM | POA: Diagnosis not present

## 2022-07-16 DIAGNOSIS — Z803 Family history of malignant neoplasm of breast: Secondary | ICD-10-CM | POA: Diagnosis not present

## 2022-07-16 DIAGNOSIS — C3490 Malignant neoplasm of unspecified part of unspecified bronchus or lung: Secondary | ICD-10-CM | POA: Diagnosis not present

## 2022-07-16 DIAGNOSIS — C778 Secondary and unspecified malignant neoplasm of lymph nodes of multiple regions: Secondary | ICD-10-CM | POA: Diagnosis not present

## 2022-07-16 DIAGNOSIS — Z5189 Encounter for other specified aftercare: Secondary | ICD-10-CM | POA: Diagnosis not present

## 2022-07-16 DIAGNOSIS — Z79899 Other long term (current) drug therapy: Secondary | ICD-10-CM | POA: Diagnosis not present

## 2022-07-16 MED ORDER — HEPARIN SOD (PORK) LOCK FLUSH 100 UNIT/ML IV SOLN: 500.0000 [IU] | Freq: Once | INTRAVENOUS | Status: DC | PRN

## 2022-07-16 MED ORDER — SODIUM CHLORIDE 0.9 % IV SOLN
10.0000 mg | Freq: Once | INTRAVENOUS | Status: AC
  Administered 2022-07-16: 10 mg via INTRAVENOUS
  Filled 2022-07-16: qty 10

## 2022-07-16 MED ORDER — SODIUM CHLORIDE 0.9 % IV SOLN: Freq: Once | INTRAVENOUS | Status: AC

## 2022-07-16 MED ORDER — SODIUM CHLORIDE 0.9 % IV SOLN
100.0000 mg/m2 | Freq: Once | INTRAVENOUS | Status: AC
  Administered 2022-07-16: 142 mg via INTRAVENOUS
  Filled 2022-07-16: qty 7.1

## 2022-07-16 MED ORDER — SODIUM CHLORIDE 0.9% FLUSH: 10.0000 mL | INTRAVENOUS | Status: DC | PRN

## 2022-07-16 NOTE — Patient Instructions (Signed)
[facility]  Discharge Instructions: Thank you for choosing [facility] to provide your oncology and hematology care.   If you have a lab appointment with the Cancer Center, please go directly to the Cancer Center and check in at the registration area.   Wear comfortable clothing and clothing appropriate for easy access to any Portacath or PICC line.   We strive to give you quality time with your provider. You may need to reschedule your appointment if you arrive late (15 or more minutes).  Arriving late affects you and other patients whose appointments are after yours.  Also, if you miss three or more appointments without notifying the office, you may be dismissed from the clinic at the provider's discretion.      For prescription refill requests, have your pharmacy contact our office and allow 72 hours for refills to be completed.    Today you received the following chemotherapy and/or immunotherapy agents; Etoposide      To help prevent nausea and vomiting after your treatment, we encourage you to take your nausea medication as directed.  BELOW ARE SYMPTOMS THAT SHOULD BE REPORTED IMMEDIATELY: *FEVER GREATER THAN 100.4 F (38 C) OR HIGHER *CHILLS OR SWEATING *NAUSEA AND VOMITING THAT IS NOT CONTROLLED WITH YOUR NAUSEA MEDICATION *UNUSUAL SHORTNESS OF BREATH *UNUSUAL BRUISING OR BLEEDING *URINARY PROBLEMS (pain or burning when urinating, or frequent urination) *BOWEL PROBLEMS (unusual diarrhea, constipation, pain near the anus) TENDERNESS IN MOUTH AND THROAT WITH OR WITHOUT PRESENCE OF ULCERS (sore throat, sores in mouth, or a toothache) UNUSUAL RASH, SWELLING OR PAIN  UNUSUAL VAGINAL DISCHARGE OR ITCHING   Items with * indicate a potential emergency and should be followed up as soon as possible or go to the Emergency Department if any problems should occur.  Please show the CHEMOTHERAPY ALERT CARD or IMMUNOTHERAPY ALERT CARD at  check-in to the Emergency Department and triage nurse.  Should you have questions after your visit or need to cancel or reschedule your appointment, please contact [facility]  Dept: [phone]  and follow the prompts.  Office hours are 8:00 a.m. to 4:30 p.m. Monday - Friday. Please note that voicemails left after 4:00 p.m. may not be returned until the following business day.  We are closed weekends and major holidays. You have access to a nurse at all times for urgent questions. Please call the main number to the clinic Dept: [phone] and follow the prompts.   For any non-urgent questions, you may also contact your provider using MyChart. We now offer e-Visits for anyone 18 and older to request care online for non-urgent symptoms. For details visit [email or web address].   Also download the MyChart app! Go to the app store, search "MyChart", open the app, select [facility], and log in with your MyChart username and password.   

## 2022-07-18 ENCOUNTER — Inpatient Hospital Stay: Payer: 59

## 2022-07-18 VITALS — BP 133/63 | HR 62 | Temp 97.7°F | Resp 18

## 2022-07-18 DIAGNOSIS — C3432 Malignant neoplasm of lower lobe, left bronchus or lung: Secondary | ICD-10-CM

## 2022-07-18 DIAGNOSIS — C778 Secondary and unspecified malignant neoplasm of lymph nodes of multiple regions: Secondary | ICD-10-CM | POA: Diagnosis not present

## 2022-07-18 DIAGNOSIS — Z5111 Encounter for antineoplastic chemotherapy: Secondary | ICD-10-CM | POA: Diagnosis not present

## 2022-07-18 DIAGNOSIS — Z5189 Encounter for other specified aftercare: Secondary | ICD-10-CM | POA: Diagnosis not present

## 2022-07-18 DIAGNOSIS — Z79899 Other long term (current) drug therapy: Secondary | ICD-10-CM | POA: Diagnosis not present

## 2022-07-18 DIAGNOSIS — Z87891 Personal history of nicotine dependence: Secondary | ICD-10-CM | POA: Diagnosis not present

## 2022-07-18 DIAGNOSIS — Z803 Family history of malignant neoplasm of breast: Secondary | ICD-10-CM | POA: Diagnosis not present

## 2022-07-18 MED ORDER — PEGFILGRASTIM-CBQV 6 MG/0.6ML ~~LOC~~ SOSY
6.0000 mg | PREFILLED_SYRINGE | Freq: Once | SUBCUTANEOUS | Status: AC
  Administered 2022-07-18: 6 mg via SUBCUTANEOUS

## 2022-07-21 ENCOUNTER — Encounter (HOSPITAL_COMMUNITY): Payer: Self-pay

## 2022-07-22 ENCOUNTER — Telehealth: Payer: Self-pay | Admitting: Medical Oncology

## 2022-07-22 ENCOUNTER — Other Ambulatory Visit: Payer: Self-pay

## 2022-07-22 NOTE — Telephone Encounter (Signed)
"   I need something for a yeast infection . I have burning and itching in perineal area".  Started 2 days ago. I instructed her to get OTC medication after talking to a pharmacist.

## 2022-07-23 ENCOUNTER — Encounter: Payer: Self-pay | Admitting: Internal Medicine

## 2022-07-23 NOTE — Progress Notes (Signed)
Pt's husband's returned my called to discuss the [employer].  I went over what it covers and gave him the income requirement.  They would like to apply so he will bring their proof of income on 08/04/22.  If approved I will give them an expense sheet and my card for any questions or concerns she may have in the future.

## 2022-07-24 ENCOUNTER — Ambulatory Visit
Admission: RE | Admit: 2022-07-24 | Discharge: 2022-07-24 | Disposition: A | Discharge status: Home / Self Care | Payer: 59 | Source: Ambulatory Visit | Attending: Radiation Oncology | Admitting: Radiation Oncology

## 2022-07-24 DIAGNOSIS — C3432 Malignant neoplasm of lower lobe, left bronchus or lung: Secondary | ICD-10-CM | POA: Insufficient documentation

## 2022-07-24 DIAGNOSIS — Z87891 Personal history of nicotine dependence: Secondary | ICD-10-CM | POA: Diagnosis not present

## 2022-07-28 ENCOUNTER — Encounter: Payer: Self-pay | Admitting: Internal Medicine

## 2022-07-28 NOTE — Progress Notes (Signed)
Pt provided her proof of income before her next appt and was approved for the $1000 [employer].

## 2022-07-30 ENCOUNTER — Other Ambulatory Visit: Payer: Self-pay | Admitting: Internal Medicine

## 2022-07-30 ENCOUNTER — Other Ambulatory Visit: Payer: Self-pay | Admitting: Student

## 2022-07-30 DIAGNOSIS — C3432 Malignant neoplasm of lower lobe, left bronchus or lung: Secondary | ICD-10-CM

## 2022-07-30 DIAGNOSIS — Z87891 Personal history of nicotine dependence: Secondary | ICD-10-CM | POA: Diagnosis not present

## 2022-07-30 NOTE — H&P (Signed)
Referring Physician(s): Mohamed,Mohamed  Supervising Physician: Gilmer Mor  Patient Status:  [facility]  Chief Complaint: "I'm here for a port a cath"   Subjective: Patient known to IR service from T12 kyphoplasty in 2014.  She is a 77 year old female ex smoker with past medical history of anxiety, hypertension, hyperlipidemia who presents now with recently diagnosed small cell left lung cancer. She is scheduled today for Port-A-Cath placement to assist with treatment. She denies fever,HA,CP,dyspnea, cough, abd pain, N/V or bleeding. She does have back pain, occ vertigo.    Past Medical History:  Diagnosis Date   Anxiety    Heart murmur    Hypertension    Scarlet fever with other complications childhood   "had to learn to work again" has had leg weakness   Past Surgical History:  Procedure Laterality Date   BRONCHIAL NEEDLE ASPIRATION BIOPSY  06/30/2022   Procedure: BRONCHIAL NEEDLE ASPIRATION BIOPSIES;  Surgeon: [name], DO;  Location: [facility];  Service: Pulmonary;;   KYPHOPLASTY     2014   peridontal surgery     VIDEO BRONCHOSCOPY WITH ENDOBRONCHIAL ULTRASOUND N/A 06/30/2022   Procedure: VIDEO BRONCHOSCOPY WITH ENDOBRONCHIAL ULTRASOUND;  Surgeon: [name], DO;  Location: [facility];  Service: Pulmonary;  Laterality: N/A;      Allergies: Antihistamines, diphenhydramine-type; Aspirin; and Evista [raloxifene]  Medications: Prior to Admission medications   Medication Sig Start Date End Date Taking? Authorizing Provider  acetaminophen (TYLENOL) 500 MG tablet Take 1,000 mg by mouth 2 (two) times daily as needed for mild pain. Reported on 06/04/2015    [provider]  amLODipine (NORVASC) 5 MG tablet TAKE 1 TABLET (5 MG TOTAL) BY MOUTH DAILY. 07/13/22   [name], MD  aspirin EC 81 MG tablet Take 81 mg by mouth 2 (two) times a week.    [provider]  atorvastatin (LIPITOR) 20 MG tablet TAKE 1 TABLET BY MOUTH EVERY  DAY Patient taking differently: Take 20 mg by mouth at bedtime. 05/25/22   [name], MD  Calcium Citrate-Vitamin D (CALCIUM CITRATE PETITE/VIT D PO) Take 1 tablet by mouth 2 (two) times daily.    [provider]  cyclobenzaprine (FLEXERIL) 5 MG tablet TAKE 1 TABLET BY MOUTH TWICE A DAY AS NEEDED FOR MUSCLE SPASM 07/13/22   [name], MD  famotidine (PEPCID) 20 MG tablet Take 20 mg by mouth daily as needed for heartburn or indigestion.    [provider]  furosemide (LASIX) 40 MG tablet TAKE 1 TABLET BY MOUTH EVERY DAY 07/13/22   [name], MD  lidocaine (LIDODERM) 5 % Place 1 patch onto the skin daily. Remove & Discard patch within 12 hours or as directed by MD 03/07/22   Sabas Sous, MD  metoprolol tartrate (LOPRESSOR) 50 MG tablet TAKE 1 TABLET BY MOUTH TWICE A DAY 04/17/22   [name], MD  prochlorperazine (COMPAZINE) 10 MG tablet Take 1 tablet (10 mg total) by mouth every 6 (six) hours as needed for nausea or vomiting. 07/08/22   Si Gaul, MD  traMADol (ULTRAM) 50 MG tablet Take 0.5-1 tablets (25-50 mg total) by mouth 2 (two) times daily as needed. For chronic low back pain. May refill monthly 05/07/22   [name], MD     Vital Signs:  Vitals:   07/31/22 1219  BP: 138/66  Pulse: 84  Resp: 15  Temp: 97.9 F (36.6 C)  SpO2: 96%      Code  Status: FULL CODE  Physical Exam: awake/alert; chest- distant BS bilat; heart- RRR; abd- soft,+BS,NT; no LE edema  Imaging: No results found.  Labs:  CBC: Recent Labs    05/07/22 1357 06/30/22 1327 07/08/22 1037 07/14/22 0936  WBC 10.0 8.9 10.0 11.7*  HGB 12.6 12.5 12.4 12.7  HCT 38.3 39.8 38.2 38.9  PLT 338.0 282 280 296    COAGS: No results for input(s): "INR", "APTT" in the last 8760 hours.  BMP: Recent Labs    03/06/22 1941 05/07/22 1357 06/30/22 1302 07/08/22 1037 07/14/22 0936  NA 127* 137 135 138 139  K 4.4 4.2 4.9 4.7 3.9  CL 84* 99 99 102 102  CO2 27  28 22  32 25  GLUCOSE 152* 106* 129* 107* 148*  BUN 23 12 15 13 11   CALCIUM 10.8* 10.5 9.8 9.7 9.9  CREATININE 1.54* 1.10 1.21* 1.03* 1.12*  GFRNONAA 35*  --  47* 57* 51*    LIVER FUNCTION TESTS: Recent Labs    03/06/22 1941 05/07/22 1357 07/08/22 1037 07/14/22 0936  BILITOT 0.5 0.5 0.4 0.4  AST 34 19 17 18   ALT 14 9 7 8   ALKPHOS 75 63 64 63  PROT 8.3* 7.3 7.6 7.8  ALBUMIN 4.4 4.3 4.5 4.6    Assessment and Plan: 77 year old female ex smoker with past medical history of anxiety, hypertension, hyperlipidemia who presents now with recently diagnosed small cell left lung cancer. She is scheduled today for Port-A-Cath placement to assist with treatment.Risks and benefits of image guided port-a-catheter placement was discussed with the patient including, but not limited to bleeding, infection, pneumothorax, or fibrin sheath development and need for additional procedures.  All of the patient's questions were answered, patient is agreeable to proceed. Consent signed and in chart.    Electronically Signed: D. Jeananne Rama, PA-C 07/30/2022, 5:34 PM   I spent a total of 25 minutes at the the patient's bedside AND on the patient's hospital floor or unit, greater than 50% of which was counseling/coordinating care for Port-A-Cath placement

## 2022-07-31 ENCOUNTER — Other Ambulatory Visit: Payer: Self-pay

## 2022-07-31 ENCOUNTER — Inpatient Hospital Stay: Payer: 59

## 2022-07-31 ENCOUNTER — Ambulatory Visit (HOSPITAL_COMMUNITY)
Admission: RE | Admit: 2022-07-31 | Discharge: 2022-07-31 | Disposition: A | Discharge status: Home / Self Care | Payer: 59 | Source: Ambulatory Visit | Attending: Internal Medicine | Admitting: Internal Medicine

## 2022-07-31 ENCOUNTER — Other Ambulatory Visit: Payer: Self-pay | Admitting: Internal Medicine

## 2022-07-31 ENCOUNTER — Encounter (HOSPITAL_COMMUNITY): Payer: Self-pay

## 2022-07-31 DIAGNOSIS — Z452 Encounter for adjustment and management of vascular access device: Secondary | ICD-10-CM | POA: Diagnosis not present

## 2022-07-31 DIAGNOSIS — I878 Other specified disorders of veins: Secondary | ICD-10-CM | POA: Diagnosis not present

## 2022-07-31 DIAGNOSIS — Z87891 Personal history of nicotine dependence: Secondary | ICD-10-CM | POA: Insufficient documentation

## 2022-07-31 DIAGNOSIS — C3432 Malignant neoplasm of lower lobe, left bronchus or lung: Secondary | ICD-10-CM

## 2022-07-31 DIAGNOSIS — C3492 Malignant neoplasm of unspecified part of left bronchus or lung: Secondary | ICD-10-CM | POA: Diagnosis not present

## 2022-07-31 DIAGNOSIS — H04123 Dry eye syndrome of bilateral lacrimal glands: Secondary | ICD-10-CM | POA: Diagnosis not present

## 2022-07-31 HISTORY — PX: IR IMAGING GUIDED PORT INSERTION: IMG5740

## 2022-07-31 MED ORDER — HEPARIN SOD (PORK) LOCK FLUSH 100 UNIT/ML IV SOLN
INTRAVENOUS | Status: AC
  Filled 2022-07-31: qty 5

## 2022-07-31 MED ORDER — MIDAZOLAM HCL 2 MG/2ML IJ SOLN
INTRAMUSCULAR | Status: AC
  Filled 2022-07-31: qty 2

## 2022-07-31 MED ORDER — HEPARIN SOD (PORK) LOCK FLUSH 100 UNIT/ML IV SOLN
500.0000 [IU] | Freq: Once | INTRAVENOUS | Status: AC
  Administered 2022-07-31: 500 [IU] via INTRAVENOUS

## 2022-07-31 MED ORDER — FENTANYL CITRATE (PF) 100 MCG/2ML IJ SOLN
INTRAMUSCULAR | Status: AC
  Filled 2022-07-31: qty 2

## 2022-07-31 MED ORDER — LORAZEPAM 2 MG/ML IJ SOLN: INTRAMUSCULAR | Status: AC | PRN

## 2022-07-31 MED ORDER — MIDAZOLAM HCL 2 MG/2ML IJ SOLN
INTRAMUSCULAR | Status: AC | PRN
  Administered 2022-07-31 (×2): 1 mg via INTRAVENOUS

## 2022-07-31 MED ORDER — SODIUM CHLORIDE 0.9 % IV SOLN: INTRAVENOUS | Status: DC

## 2022-07-31 MED ORDER — FENTANYL CITRATE (PF) 100 MCG/2ML IJ SOLN
INTRAMUSCULAR | Status: AC | PRN
  Administered 2022-07-31 (×2): 50 ug via INTRAVENOUS

## 2022-07-31 MED ORDER — LIDOCAINE-EPINEPHRINE 1 %-1:100000 IJ SOLN
INTRAMUSCULAR | Status: AC
  Filled 2022-07-31: qty 1

## 2022-07-31 MED ORDER — LIDOCAINE-EPINEPHRINE 1 %-1:100000 IJ SOLN
20.0000 mL | Freq: Once | INTRAMUSCULAR | Status: AC
  Administered 2022-07-31: 20 mL via INTRADERMAL

## 2022-07-31 NOTE — Discharge Instructions (Signed)
For questions /concerns may call Interventional Radiology at [phone] or  Interventional Radiology clinic [phone]   You may remove your dressing and shower tomorrow afternoon  DO NOT use EMLA cream for 2 weeks after port placement as the cream will remove surgical glue on your incision.     Implanted Port Insertion, Care After This sheet gives you information about how to care for yourself after your procedure. Your health care provider may also give you more specific instructions. If you have problems or questions, contact your health careprovider. What can I expect after the procedure? After the procedure, it is common to have: Discomfort at the port insertion site. Bruising on the skin over the port. This should improve over 3-4 days. Follow these instructions at home: Port care After your port is placed, you will get a manufacturer's information card. The card has information about your port. Keep this card with you at all times. Take care of the port as told by your health care provider. Ask your health care provider if you or a family member can get training for taking care of the port at home. A home health care nurse may also take care of the port. Make sure to remember what type of port you have. Incision care Follow instructions from your health care provider about how to take care of your port insertion site. Make sure you: Wash your hands with soap and water before and after you change your bandage (dressing). If soap and water are not available, use hand sanitizer. Change your dressing as told by your health care provider. Leave skin glue, or adhesive strips in place. These skin closures may need to stay in place for 2 weeks or longer.  Check your port insertion site every day for signs of infection. Check for:      - Redness, swelling, or pain.                     - Fluid or blood.      - Warmth.      - Pus or a bad smell. Activity Return to your normal  activities as told by your health care provider. Ask your health care provider what activities are safe for you. Do not lift anything that is heavier than 10 lb (4.5 kg), or the limit that you are told, until your health care provider says that it is safe. General instructions Take over-the-counter and prescription medicines only as told by your health care provider. Do not take baths, swim, or use a hot tub until your health care provider approves. Ask your health care provider if you may take showers. You may only be allowed to take sponge baths. Do not drive for 24 hours if you were given a sedative during your procedure. Wear a medical alert bracelet in case of an emergency. This will tell any health care providers that you have a port. Keep all follow-up visits as told by your health care provider. This is important. Contact a health care provider if: You cannot flush your port with saline as directed, or you cannot draw blood from the port. You have a fever or chills. You have redness, swelling, or pain around your port insertion site. You have fluid or blood coming from your port insertion site. Your port insertion site feels warm to the touch. You have pus or a bad smell coming from the port insertion site. Get help right away if: You have chest pain or shortness   of breath. You have bleeding from your port that you cannot control. Summary Take care of the port as told by your health care provider. Keep the manufacturer's information card with you at all times. Change your dressing as told by your health care provider. Contact a health care provider if you have a fever or chills or if you have redness, swelling, or pain around your port insertion site. Keep all follow-up visits as told by your health care provider. This information is not intended to replace advice given to you by your health care provider. Make sure you discuss any questions you have with your healthcare  provider.   Moderate Conscious Sedation, Adult, Care After This sheet gives you information about how to care for yourself after your procedure. Your health care provider may also give you more specific instructions. If you have problems or questions, contact your health careprovider. What can I expect after the procedure? After the procedure, it is common to have: Sleepiness for several hours. Impaired judgment for several hours. Difficulty with balance. Vomiting if you eat too soon. Follow these instructions at home: For the time period you were told by your health care provider: Rest. Do not participate in activities where you could fall or become injured. Do not drive or use machinery. Do not drink alcohol. Do not take sleeping pills or medicines that cause drowsiness. Do not make important decisions or sign legal documents. Do not take care of children on your own. Eating and drinking  Follow the diet recommended by your health care provider. Drink enough fluid to keep your urine pale yellow. If you vomit: Drink water, juice, or soup when you can drink without vomiting. Make sure you have little or no nausea before eating solid foods.  General instructions Take over-the-counter and prescription medicines only as told by your health care provider. Have a responsible adult stay with you for the time you are told. It is important to have someone help care for you until you are awake and alert. Do not smoke. Keep all follow-up visits as told by your health care provider. This is important. Contact a health care provider if: You are still sleepy or having trouble with balance after 24 hours. You feel light-headed. You keep feeling nauseous or you keep vomiting. You develop a rash. You have a fever. You have redness or swelling around the IV site. Get help right away if: You have trouble breathing. You have new-onset confusion at home. Summary After the procedure, it is  common to feel sleepy, have impaired judgment, or feel nauseous if you eat too soon. Rest after you get home. Know the things you should not do after the procedure. Follow the diet recommended by your health care provider and drink enough fluid to keep your urine pale yellow. Get help right away if you have trouble breathing or new-onset confusion at home. This information is not intended to replace advice given to you by your health care provider. Make sure you discuss any questions you have with your healthcare provider. Document Revised: 06/16/2019 Document Reviewed: 01/12/2019 Elsevier Patient Education  2022 [employer] 

## 2022-07-31 NOTE — Procedures (Signed)
Interventional Radiology Procedure Note  Procedure: Placement of a right IJ approach single lumen PowerPort.  Tip is positioned at the superior cavoatrial junction and catheter is ready for immediate use.  Complications: None Recommendations:  - Ok to shower tomorrow - Do not submerge for 7 days - Routine line care   Signed,  Zenith Lamphier S. Jayliani Wanner, DO   

## 2022-08-01 NOTE — Progress Notes (Unsigned)
[facility] OFFICE PROGRESS NOTE  [name], MD [address] [place] [address]  DIAGNOSIS: Limited stage (T2a, N2, M0) small cell lung cancer presented with left lower lobe lung mass in addition to left hilar and mediastinal lymphadenopathy diagnosed in April 2024.   PRIOR THERAPY: None  CURRENT THERAPY: Systemic chemotherapy with carboplatin for AUC of 5 on day 1 and 2 etoposide 100 Mg/M2 on days 1, 2 and 3 every 3 weeks. This will be concurrent with radiotherapy. She is status post 1 cycle of treatment. First dose on 07/14/22.   INTERVAL HISTORY: Sheena [name] 77 y.o. female returns to the clinic for a follow up visit accompanied by her husband. The patient is feeling well today without any concerning complaints. She is followed for her recently diagnosed with small cell lung cancer. She is currently undergoing chemotherapy and radiation. She had her staging brain MRI which was negative. She is status post her first cycle of treatment and tolerated it well except she developed a vaginal yeast infection improved at this time. Denies any fever, chills, or night sweats.  She reports that her appetite is "not always good".  She drinks supplemental protein drinks approximately once per week.  She be interested in seeing a member of the nutritionist team. Denies any chest pain or hemoptysis.  She does not exert herself so she denies any dyspnea on exertion.  She denies any cough.  Any nausea, vomiting, diarrhea, or constipation. Denies any headache or visual changes. Denies any rashes or skin changes.  He recently had a cath placed.  She does not have a prescription for Emla cream.  The patient is here today for evaluation prior to starting cycle # 2.    MEDICAL HISTORY: Past Medical History:  Diagnosis Date   Anxiety    Heart murmur    Hypertension    Scarlet fever with other complications childhood   "had to learn to work again" has had leg weakness     ALLERGIES:  is allergic to antihistamines, diphenhydramine-type; aspirin; and evista [raloxifene].  MEDICATIONS:  Current Outpatient Medications  Medication Sig Dispense Refill   acetaminophen (TYLENOL) 500 MG tablet Take 1,000 mg by mouth 2 (two) times daily as needed for mild pain. Reported on 06/04/2015     amLODipine (NORVASC) 5 MG tablet TAKE 1 TABLET (5 MG TOTAL) BY MOUTH DAILY. 90 tablet 3   aspirin EC 81 MG tablet Take 81 mg by mouth 2 (two) times a week.     atorvastatin (LIPITOR) 20 MG tablet TAKE 1 TABLET BY MOUTH EVERY DAY (Patient taking differently: Take 20 mg by mouth at bedtime.) 90 tablet 3   Calcium Citrate-Vitamin D (CALCIUM CITRATE PETITE/VIT D PO) Take 1 tablet by mouth 2 (two) times daily.     famotidine (PEPCID) 20 MG tablet Take 20 mg by mouth daily as needed for heartburn or indigestion.     furosemide (LASIX) 40 MG tablet TAKE 1 TABLET BY MOUTH EVERY DAY 90 tablet 1   lidocaine-prilocaine (EMLA) cream Apply 1 Application topically as needed. 30 g 2   metoprolol tartrate (LOPRESSOR) 50 MG tablet TAKE 1 TABLET BY MOUTH TWICE A DAY 180 tablet 1   traMADol (ULTRAM) 50 MG tablet Take 0.5-1 tablets (25-50 mg total) by mouth 2 (two) times daily as needed. For chronic low back pain. May refill monthly 60 tablet 2   cyclobenzaprine (FLEXERIL) 5 MG tablet TAKE 1 TABLET BY MOUTH TWICE A DAY AS NEEDED FOR  MUSCLE SPASM (Patient not taking: Reported on 08/04/2022) 30 tablet 1   prochlorperazine (COMPAZINE) 10 MG tablet Take 1 tablet (10 mg total) by mouth every 6 (six) hours as needed for nausea or vomiting. 30 tablet 2   No current facility-administered medications for this visit.    SURGICAL HISTORY:  Past Surgical History:  Procedure Laterality Date   BRONCHIAL NEEDLE ASPIRATION BIOPSY  06/30/2022   Procedure: BRONCHIAL NEEDLE ASPIRATION BIOPSIES;  Surgeon: [name], DO;  Location: [facility];  Service: Pulmonary;;   IR IMAGING GUIDED PORT INSERTION  07/31/2022    KYPHOPLASTY     2014   peridontal surgery     VIDEO BRONCHOSCOPY WITH ENDOBRONCHIAL ULTRASOUND N/A 06/30/2022   Procedure: VIDEO BRONCHOSCOPY WITH ENDOBRONCHIAL ULTRASOUND;  Surgeon: [name], DO;  Location: [facility];  Service: Pulmonary;  Laterality: N/A;    REVIEW OF SYSTEMS:   Review of Systems  Constitutional: Positive for fatigue and occasional poor appetite and a few pound weight loss.  Negative for chills and fever.  HENT: Negative for mouth sores, nosebleeds, sore throat and trouble swallowing.   Eyes: Negative for eye problems and icterus.  Respiratory: Negative for cough, hemoptysis, shortness of breath and wheezing.   Cardiovascular: Negative for chest pain and leg swelling.  Gastrointestinal: Negative for abdominal pain, constipation, diarrhea, nausea and vomiting.  Genitourinary: Negative for bladder incontinence, difficulty urinating, dysuria, frequency and hematuria.   Musculoskeletal: Negative for back pain, gait problem, neck pain and neck stiffness.  Skin: Negative for itching and rash.  Neurological: Negative for dizziness, extremity weakness, gait problem, headaches, light-headedness and seizures.  Hematological: Negative for adenopathy. Does not bruise/bleed easily.  Psychiatric/Behavioral: Negative for confusion, depression and sleep disturbance. The patient is not nervous/anxious.     PHYSICAL EXAMINATION:  Blood pressure 119/63, pulse 85, temperature (!) 97.3 F (36.3 C), resp. rate 18, weight 101 lb 8 oz (46 kg), SpO2 97 %.  ECOG PERFORMANCE STATUS: 1  Physical Exam  Constitutional: Oriented to person, place, and time and thin appearing female and in no distress.  HENT:  Head: Normocephalic and atraumatic.  Mouth/Throat: Oropharynx is clear and moist. No oropharyngeal exudate.  Eyes: Conjunctivae are normal. Right eye exhibits no discharge. Left eye exhibits no discharge. No scleral icterus.  Neck: Normal range of motion. Neck supple.   Cardiovascular: Normal rate, regular rhythm, normal heart sounds and intact distal pulses.   Pulmonary/Chest: Effort normal and breath sounds normal. No respiratory distress. No wheezes. No rales.  Abdominal: Soft. Bowel sounds are normal. Exhibits no distension and no mass. There is no tenderness.  Musculoskeletal: Normal range of motion. Exhibits no edema.  Lymphadenopathy:    No cervical adenopathy.  Neurological: Alert and oriented to person, place, and time. Exhibits also wasting.  She was examined in the wheelchair.  Skin: Skin is warm and dry. No rash noted. Not diaphoretic. No erythema. No pallor.  Psychiatric: Mood, memory and judgment normal.  Vitals reviewed.  LABORATORY DATA: Lab Results  Component Value Date   WBC 33.1 (H) 08/04/2022   HGB 10.4 (L) 08/04/2022   HCT 31.1 (L) 08/04/2022   MCV 93.7 08/04/2022   PLT 499 (H) 08/04/2022      Chemistry      Component Value Date/Time   NA 137 08/04/2022 1048   K 4.1 08/04/2022 1048   CL 100 08/04/2022 1048   CO2 27 08/04/2022 1048   BUN 11 08/04/2022 1048   CREATININE 0.95 08/04/2022 1048   CREATININE 1.18 (  H) 02/19/2020 1048      Component Value Date/Time   CALCIUM 9.5 08/04/2022 1048   ALKPHOS 129 (H) 08/04/2022 1048   AST 14 (L) 08/04/2022 1048   ALT 8 08/04/2022 1048   BILITOT 0.3 08/04/2022 1048       RADIOGRAPHIC STUDIES:  IR IMAGING GUIDED PORT INSERTION  Result Date: 07/31/2022 INDICATION: 77 year old female referred for port catheter EXAM: IMAGE GUIDED PORT CATHETER MEDICATIONS: None ANESTHESIA/SEDATION: Moderate (conscious) sedation was employed during this procedure. A total of Versed 2.0 mg and Fentanyl 100 mcg was administered intravenously. Moderate Sedation Time: 17 minutes. The patient's level of consciousness and vital signs were monitored continuously by radiology nursing throughout the procedure under my direct supervision. FLUOROSCOPY TIME:  Fluoroscopy Time 0 mGy). COMPLICATIONS: None  PROCEDURE: Informed written consent was obtained from the patient after a thorough discussion of the procedural risks, benefits and alternatives. All questions were addressed. Maximal Sterile Barrier Technique was utilized including caps, mask, sterile gowns, sterile gloves, sterile drape, hand hygiene and skin antiseptic. A timeout was performed prior to the initiation of the procedure. Ultrasound survey was performed with images stored and sent to PACs. Right IJ vein documented to be patent. The right neck and chest was prepped with chlorhexidine, and draped in the usual sterile fashion using maximum barrier technique (cap and mask, sterile gown, sterile gloves, large sterile sheet, hand hygiene and cutaneous antiseptic). Local anesthesia was attained by infiltration with 1% lidocaine without epinephrine. Ultrasound demonstrated patency of the right internal jugular vein, and this was documented with an image. Under real-time ultrasound guidance, this vein was accessed with a 21 gauge micropuncture needle and image documentation was performed. A small dermatotomy was made at the access site with an 11 scalpel. A 0.018" wire was advanced into the SVC and used to estimate the length of the internal catheter. The access needle exchanged for a 20F micropuncture vascular sheath. The 0.018" wire was then removed and a 0.035" wire advanced into the IVC. An appropriate location for the subcutaneous reservoir was selected below the clavicle and an incision was made through the skin and underlying soft tissues. The subcutaneous tissues were then dissected using a combination of blunt and sharp surgical technique and a pocket was formed. A single lumen power injectable portacatheter was then tunneled through the subcutaneous tissues from the pocket to the dermatotomy and the port reservoir placed within the subcutaneous pocket. The venous access site was then serially dilated and a peel away vascular sheath placed over the  wire. The wire was removed and the port catheter advanced into position under fluoroscopic guidance. The catheter tip is positioned in the cavoatrial junction. This was documented with a spot image. The portacatheter was then tested and found to flush and aspirate well. The port was flushed with saline followed by 100 units/mL heparinized saline. The pocket was then closed in two layers using first subdermal inverted interrupted absorbable sutures followed by a running subcuticular suture. The epidermis was then sealed with Dermabond. The dermatotomy at the venous access site was also seal with Dermabond. Patient tolerated the procedure well and remained hemodynamically stable throughout. No complications encountered and no significant blood loss encountered IMPRESSION: Status post right IJ port catheter placement. Signed, Yvone Neu. Miachel Roux, RPVI [facility] Radiology Electronically Signed   By: Gilmer Mor D.O.   On: 07/31/2022 16:25   MR BRAIN W WO CONTRAST  Result Date: 07/15/2022 CLINICAL DATA:  Small cell lung cancer (  SCLC), staging EXAM: MRI HEAD WITHOUT AND WITH CONTRAST TECHNIQUE: Multiplanar, multiecho pulse sequences of the brain and surrounding structures were obtained without and with intravenous contrast. CONTRAST:  5mL GADAVIST GADOBUTROL 1 MMOL/ML IV SOLN COMPARISON:  MRI April 06, 2011. FINDINGS: Brain: No acute infarction, hemorrhage, hydrocephalus, extra-axial collection or mass lesion. No pathologic enhancement. Vascular: Major arterial flow voids are maintained at the skull base. Skull and upper cervical spine: Normal marrow signal. Sinuses/Orbits: Clear sinuses.  No acute orbital findings. Other: No mastoid effusions. IMPRESSION: No evidence of acute intracranial abnormality or metastatic disease. Electronically Signed   By: Feliberto Harts M.D.   On: 07/15/2022 12:15     ASSESSMENT/PLAN:  This is a very pleasant 77 year old  Caucasian female recently diagnosed with at least limited stage (T2a, N2, M0) small cell lung cancer presented with left lower lobe lung mass in addition to left hilar and mediastinal lymphadenopathy diagnosed in April 2024.   She is currently undergoing treatment with systemic chemotherapy with carboplatin for AUC of 5 on day 1 and etoposide 100 Mg/M2 on days 1, 2 and 3 every 3 weeks.  This will be concurrent with radiotherapy if the patient has no evidence of metastatic disease to the brain.  She will not be a good candidate for treatment with cisplatin because of her comorbidities and age. She is status post 1 cycle and tolerated it well without any appreciable adverse side effects except she had a yeast infection which resolved.    Reviewed her brain MRI that was negative for metastatic disease to the brain.   Labs were reviewed. Recommend she proceed with cycle #2 today as scheduled.   I will arrnage for a restaging CT scan of the chest to review at her next appointment for cycle #3.   I reviewed the patient's schedule with her.  We will request weekly labs and labs with port flush given her recent Port-A-Cath insertion.  I also sent her prescription for Emla cream.  Refilled her Compazine.  I have referred the patient to a member the nutritionist team given her weight loss and decreased appetite.  I also encouraged her to drink protein supplemental drinks at least once per day.  The patient was advised to call immediately if she has any concerning symptoms in the interval. The patient voices understanding of current disease status and treatment options and is in agreement with the current care plan. All questions were answered. The patient knows to call the clinic with any problems, questions or concerns. We can certainly see the patient much sooner if necessary     Orders Placed This Encounter  Procedures   CT Chest W Contrast    Standing Status:   Future    Standing Expiration  Date:   08/04/2023    Order Specific Question:   If indicated for the ordered procedure, I authorize the administration of contrast media per Radiology protocol    Answer:   Yes    Order Specific Question:   Does the patient have a contrast media/X-ray dye allergy?    Answer:   No    Order Specific Question:   Preferred imaging location?    Answer:   [facility]     The total time spent in the appointment was 20-29 minutes  Brenlynn Fake L Olga Bourbeau, PA-C 08/04/22

## 2022-08-03 MED FILL — Fosaprepitant Dimeglumine For IV Infusion 150 MG (Base Eq): INTRAVENOUS | Qty: 5 | Status: AC

## 2022-08-03 MED FILL — Dexamethasone Sodium Phosphate Inj 100 MG/10ML: INTRAMUSCULAR | Qty: 1 | Status: AC

## 2022-08-04 ENCOUNTER — Inpatient Hospital Stay: Payer: 59

## 2022-08-04 ENCOUNTER — Ambulatory Visit
Admission: RE | Admit: 2022-08-04 | Discharge: 2022-08-04 | Disposition: A | Discharge status: Home / Self Care | Payer: 59 | Source: Ambulatory Visit | Attending: Radiation Oncology | Admitting: Radiation Oncology

## 2022-08-04 ENCOUNTER — Inpatient Hospital Stay (HOSPITAL_BASED_OUTPATIENT_CLINIC_OR_DEPARTMENT_OTHER): Payer: 59 | Admitting: Physician Assistant

## 2022-08-04 ENCOUNTER — Other Ambulatory Visit: Payer: Self-pay

## 2022-08-04 ENCOUNTER — Inpatient Hospital Stay: Payer: 59 | Attending: Internal Medicine

## 2022-08-04 VITALS — BP 119/63 | HR 85 | Temp 97.3°F | Resp 18 | Wt 101.5 lb

## 2022-08-04 VITALS — BP 139/63 | HR 88 | Resp 16

## 2022-08-04 DIAGNOSIS — Z5111 Encounter for antineoplastic chemotherapy: Secondary | ICD-10-CM | POA: Insufficient documentation

## 2022-08-04 DIAGNOSIS — Z5189 Encounter for other specified aftercare: Secondary | ICD-10-CM | POA: Diagnosis not present

## 2022-08-04 DIAGNOSIS — Z79899 Other long term (current) drug therapy: Secondary | ICD-10-CM | POA: Insufficient documentation

## 2022-08-04 DIAGNOSIS — Z510 Encounter for antineoplastic radiation therapy: Secondary | ICD-10-CM | POA: Insufficient documentation

## 2022-08-04 DIAGNOSIS — D6481 Anemia due to antineoplastic chemotherapy: Secondary | ICD-10-CM | POA: Insufficient documentation

## 2022-08-04 DIAGNOSIS — C3432 Malignant neoplasm of lower lobe, left bronchus or lung: Secondary | ICD-10-CM | POA: Insufficient documentation

## 2022-08-04 DIAGNOSIS — D649 Anemia, unspecified: Secondary | ICD-10-CM | POA: Insufficient documentation

## 2022-08-04 DIAGNOSIS — Z87891 Personal history of nicotine dependence: Secondary | ICD-10-CM | POA: Insufficient documentation

## 2022-08-04 DIAGNOSIS — C778 Secondary and unspecified malignant neoplasm of lymph nodes of multiple regions: Secondary | ICD-10-CM | POA: Diagnosis not present

## 2022-08-04 LAB — CMP (CANCER CENTER ONLY)
ALT: 8 U/L (ref 0–44)
AST: 14 U/L — ABNORMAL LOW (ref 15–41)
Albumin: 4 g/dL (ref 3.5–5.0)
Alkaline Phosphatase: 129 U/L — ABNORMAL HIGH (ref 38–126)
Anion gap: 10 (ref 5–15)
BUN: 11 mg/dL (ref 8–23)
CO2: 27 mmol/L (ref 22–32)
Calcium: 9.5 mg/dL (ref 8.9–10.3)
Chloride: 100 mmol/L (ref 98–111)
Creatinine: 0.95 mg/dL (ref 0.44–1.00)
GFR, Estimated: >60 mL/min (ref >60)
Glucose, Bld: 137 mg/dL — ABNORMAL HIGH (ref 70–99)
Potassium: 4.1 mmol/L (ref 3.5–5.1)
Sodium: 137 mmol/L (ref 135–145)
Total Bilirubin: 0.3 mg/dL (ref 0.3–1.2)
Total Protein: 7.4 g/dL (ref 6.5–8.1)

## 2022-08-04 LAB — RAD ONC ARIA SESSION SUMMARY
Course Elapsed Days: 0
Plan Fractions Treated to Date: 1
Plan Prescribed Dose Per Fraction: 2 Gy
Plan Total Fractions Prescribed: 30
Plan Total Prescribed Dose: 60 Gy
Reference Point Dosage Given to Date: 2 Gy
Reference Point Session Dosage Given: 2 Gy
Session Number: 1

## 2022-08-04 LAB — CBC WITH DIFFERENTIAL (CANCER CENTER ONLY)
Abs Immature Granulocytes: 0.84 10*3/uL — ABNORMAL HIGH (ref 0.00–0.07)
Basophils Absolute: 0.2 10*3/uL — ABNORMAL HIGH (ref 0.0–0.1)
Basophils Relative: 1 %
Eosinophils Absolute: 0 10*3/uL (ref 0.0–0.5)
Eosinophils Relative: 0 %
HCT: 31.1 % — ABNORMAL LOW (ref 36.0–46.0)
Hemoglobin: 10.4 g/dL — ABNORMAL LOW (ref 12.0–15.0)
Immature Granulocytes: 3 %
Lymphocytes Relative: 8 %
Lymphs Abs: 2.6 10*3/uL (ref 0.7–4.0)
MCH: 31.3 pg (ref 26.0–34.0)
MCHC: 33.4 g/dL (ref 30.0–36.0)
MCV: 93.7 fL (ref 80.0–100.0)
Monocytes Absolute: 2.1 10*3/uL — ABNORMAL HIGH (ref 0.1–1.0)
Monocytes Relative: 6 %
Neutro Abs: 27.4 10*3/uL — ABNORMAL HIGH (ref 1.7–7.7)
Neutrophils Relative %: 82 %
Platelet Count: 499 10*3/uL — ABNORMAL HIGH (ref 150–400)
RBC: 3.32 MIL/uL — ABNORMAL LOW (ref 3.87–5.11)
RDW: 12.6 % (ref 11.5–15.5)
WBC Count: 33.1 10*3/uL — ABNORMAL HIGH (ref 4.0–10.5)
nRBC: 0 % (ref 0.0–0.2)

## 2022-08-04 MED ORDER — PROCHLORPERAZINE MALEATE 10 MG PO TABS
10.0000 mg | ORAL_TABLET | Freq: Four times a day (QID) | ORAL | 2 refills | Status: AC | PRN
Start: 2022-08-04 — End: ?

## 2022-08-04 MED ORDER — PALONOSETRON HCL INJECTION 0.25 MG/5ML
0.2500 mg | Freq: Once | INTRAVENOUS | Status: AC
  Administered 2022-08-04: 0.25 mg via INTRAVENOUS
  Filled 2022-08-04: qty 5

## 2022-08-04 MED ORDER — LIDOCAINE-PRILOCAINE 2.5-2.5 % EX CREA
1.0000 | TOPICAL_CREAM | CUTANEOUS | 2 refills | Status: DC | PRN
Start: 2022-08-04 — End: 2023-03-16

## 2022-08-04 MED ORDER — SODIUM CHLORIDE 0.9 % IV SOLN
150.0000 mg | Freq: Once | INTRAVENOUS | Status: AC
  Administered 2022-08-04: 150 mg via INTRAVENOUS
  Filled 2022-08-04: qty 150

## 2022-08-04 MED ORDER — SODIUM CHLORIDE 0.9 % IV SOLN
304.5000 mg | Freq: Once | INTRAVENOUS | Status: AC
  Administered 2022-08-04: 300 mg via INTRAVENOUS
  Filled 2022-08-04: qty 30

## 2022-08-04 MED ORDER — SODIUM CHLORIDE 0.9% FLUSH
10.0000 mL | INTRAVENOUS | Status: DC | PRN
  Administered 2022-08-04: 10 mL

## 2022-08-04 MED ORDER — SODIUM CHLORIDE 0.9 % IV SOLN
10.0000 mg | Freq: Once | INTRAVENOUS | Status: AC
  Administered 2022-08-04: 10 mg via INTRAVENOUS
  Filled 2022-08-04: qty 10

## 2022-08-04 MED ORDER — HEPARIN SOD (PORK) LOCK FLUSH 100 UNIT/ML IV SOLN
500.0000 [IU] | Freq: Once | INTRAVENOUS | Status: AC | PRN
  Administered 2022-08-04: 500 [IU]

## 2022-08-04 MED ORDER — SODIUM CHLORIDE 0.9 % IV SOLN: Freq: Once | INTRAVENOUS | Status: AC

## 2022-08-04 MED ORDER — SODIUM CHLORIDE 0.9 % IV SOLN
100.0000 mg/m2 | Freq: Once | INTRAVENOUS | Status: AC
  Administered 2022-08-04: 142 mg via INTRAVENOUS
  Filled 2022-08-04: qty 7.1

## 2022-08-04 NOTE — Patient Instructions (Incomplete)
[facility] [facility]  Discharge Instructions: Thank you for choosing [facility] to provide your oncology and hematology care.   If you have a lab appointment with the Cancer Center, please go directly to the Cancer Center and check in at the registration area.   Wear comfortable clothing and clothing appropriate for easy access to any Portacath or PICC line.   We strive to give you quality time with your provider. You may need to reschedule your appointment if you arrive late (15 or more minutes).  Arriving late affects you and other patients whose appointments are after yours.  Also, if you miss three or more appointments without notifying the office, you may be dismissed from the clinic at the provider's discretion.      For prescription refill requests, have your pharmacy contact our office and allow 72 hours for refills to be completed.    Today you received the following chemotherapy and/or immunotherapy agents: Carboplatin and Etoposide      To help prevent nausea and vomiting after your treatment, we encourage you to take your nausea medication as directed.  BELOW ARE SYMPTOMS THAT SHOULD BE REPORTED IMMEDIATELY: *FEVER GREATER THAN 100.4 F (38 C) OR HIGHER *CHILLS OR SWEATING *NAUSEA AND VOMITING THAT IS NOT CONTROLLED WITH YOUR NAUSEA MEDICATION *UNUSUAL SHORTNESS OF BREATH *UNUSUAL BRUISING OR BLEEDING *URINARY PROBLEMS (pain or burning when urinating, or frequent urination) *BOWEL PROBLEMS (unusual diarrhea, constipation, pain near the anus) TENDERNESS IN MOUTH AND THROAT WITH OR WITHOUT PRESENCE OF ULCERS (sore throat, sores in mouth, or a toothache) UNUSUAL RASH, SWELLING OR PAIN  UNUSUAL VAGINAL DISCHARGE OR ITCHING   Items with * indicate a potential emergency and should be followed up as soon as possible or go to the Emergency Department if any problems should occur.  Please show the CHEMOTHERAPY ALERT CARD or IMMUNOTHERAPY  ALERT CARD at check-in to the Emergency Department and triage nurse.  Should you have questions after your visit or need to cancel or reschedule your appointment, please contact [place] [facility]  Dept: [phone]  and follow the prompts.  Office hours are 8:00 a.m. to 4:30 p.m. Monday - Friday. Please note that voicemails left after 4:00 p.m. may not be returned until the following business day.  We are closed weekends and major holidays. You have access to a nurse at all times for urgent questions. Please call the main number to the clinic Dept: [phone] and follow the prompts.   For any non-urgent questions, you may also contact your provider using MyChart. We now offer e-Visits for anyone 18 and older to request care online for non-urgent symptoms. For details visit [email or web address].   Also download the MyChart app! Go to the app store, search "MyChart", open the app, select [place], and log in with your MyChart username and password.   

## 2022-08-05 ENCOUNTER — Other Ambulatory Visit: Payer: Self-pay

## 2022-08-05 ENCOUNTER — Inpatient Hospital Stay: Payer: 59

## 2022-08-05 ENCOUNTER — Ambulatory Visit
Admission: RE | Admit: 2022-08-05 | Discharge: 2022-08-05 | Disposition: A | Discharge status: Home / Self Care | Payer: 59 | Source: Ambulatory Visit | Attending: Radiation Oncology | Admitting: Radiation Oncology

## 2022-08-05 VITALS — BP 116/51 | HR 76 | Temp 98.0°F | Resp 16

## 2022-08-05 DIAGNOSIS — D6481 Anemia due to antineoplastic chemotherapy: Secondary | ICD-10-CM | POA: Diagnosis not present

## 2022-08-05 DIAGNOSIS — Z87891 Personal history of nicotine dependence: Secondary | ICD-10-CM | POA: Diagnosis not present

## 2022-08-05 DIAGNOSIS — D649 Anemia, unspecified: Secondary | ICD-10-CM | POA: Diagnosis not present

## 2022-08-05 DIAGNOSIS — Z510 Encounter for antineoplastic radiation therapy: Secondary | ICD-10-CM | POA: Diagnosis not present

## 2022-08-05 DIAGNOSIS — Z5111 Encounter for antineoplastic chemotherapy: Secondary | ICD-10-CM | POA: Diagnosis not present

## 2022-08-05 DIAGNOSIS — C778 Secondary and unspecified malignant neoplasm of lymph nodes of multiple regions: Secondary | ICD-10-CM | POA: Diagnosis not present

## 2022-08-05 DIAGNOSIS — C3432 Malignant neoplasm of lower lobe, left bronchus or lung: Secondary | ICD-10-CM | POA: Diagnosis not present

## 2022-08-05 DIAGNOSIS — Z5189 Encounter for other specified aftercare: Secondary | ICD-10-CM | POA: Diagnosis not present

## 2022-08-05 DIAGNOSIS — Z79899 Other long term (current) drug therapy: Secondary | ICD-10-CM | POA: Diagnosis not present

## 2022-08-05 LAB — RAD ONC ARIA SESSION SUMMARY
Course Elapsed Days: 1
Plan Fractions Treated to Date: 2
Plan Prescribed Dose Per Fraction: 2 Gy
Plan Total Fractions Prescribed: 30
Plan Total Prescribed Dose: 60 Gy
Reference Point Dosage Given to Date: 4 Gy
Reference Point Session Dosage Given: 2 Gy
Session Number: 2

## 2022-08-05 MED ORDER — SODIUM CHLORIDE 0.9 % IV SOLN
10.0000 mg | Freq: Once | INTRAVENOUS | Status: AC
  Administered 2022-08-05: 10 mg via INTRAVENOUS
  Filled 2022-08-05: qty 10

## 2022-08-05 MED ORDER — HEPARIN SOD (PORK) LOCK FLUSH 100 UNIT/ML IV SOLN
500.0000 [IU] | Freq: Once | INTRAVENOUS | Status: AC | PRN
  Administered 2022-08-05: 500 [IU]

## 2022-08-05 MED ORDER — SODIUM CHLORIDE 0.9 % IV SOLN: Freq: Once | INTRAVENOUS | Status: AC

## 2022-08-05 MED ORDER — SODIUM CHLORIDE 0.9 % IV SOLN
100.0000 mg/m2 | Freq: Once | INTRAVENOUS | Status: AC
  Administered 2022-08-05: 142 mg via INTRAVENOUS
  Filled 2022-08-05: qty 7.1

## 2022-08-05 MED ORDER — SODIUM CHLORIDE 0.9% FLUSH
10.0000 mL | INTRAVENOUS | Status: DC | PRN
  Administered 2022-08-05: 10 mL

## 2022-08-05 MED FILL — Dexamethasone Sodium Phosphate Inj 100 MG/10ML: INTRAMUSCULAR | Qty: 1 | Status: AC

## 2022-08-05 NOTE — Patient Instructions (Signed)
[place] [facility]  Discharge Instructions: Thank you for choosing [facility] to provide your oncology and hematology care.   If you have a lab appointment with the Cancer Center, please go directly to the Cancer Center and check in at the registration area.   Wear comfortable clothing and clothing appropriate for easy access to any Portacath or PICC line.   We strive to give you quality time with your provider. You may need to reschedule your appointment if you arrive late (15 or more minutes).  Arriving late affects you and other patients whose appointments are after yours.  Also, if you miss three or more appointments without notifying the office, you may be dismissed from the clinic at the provider's discretion.      For prescription refill requests, have your pharmacy contact our office and allow 72 hours for refills to be completed.    Today you received the following chemotherapy and/or immunotherapy agents: etoposide      To help prevent nausea and vomiting after your treatment, we encourage you to take your nausea medication as directed.  BELOW ARE SYMPTOMS THAT SHOULD BE REPORTED IMMEDIATELY: *FEVER GREATER THAN 100.4 F (38 C) OR HIGHER *CHILLS OR SWEATING *NAUSEA AND VOMITING THAT IS NOT CONTROLLED WITH YOUR NAUSEA MEDICATION *UNUSUAL SHORTNESS OF BREATH *UNUSUAL BRUISING OR BLEEDING *URINARY PROBLEMS (pain or burning when urinating, or frequent urination) *BOWEL PROBLEMS (unusual diarrhea, constipation, pain near the anus) TENDERNESS IN MOUTH AND THROAT WITH OR WITHOUT PRESENCE OF ULCERS (sore throat, sores in mouth, or a toothache) UNUSUAL RASH, SWELLING OR PAIN  UNUSUAL VAGINAL DISCHARGE OR ITCHING   Items with * indicate a potential emergency and should be followed up as soon as possible or go to the Emergency Department if any problems should occur.  Please show the CHEMOTHERAPY ALERT CARD or IMMUNOTHERAPY ALERT CARD at  check-in to the Emergency Department and triage nurse.  Should you have questions after your visit or need to cancel or reschedule your appointment, please contact [facility]  Dept: [phone]  and follow the prompts.  Office hours are 8:00 a.m. to 4:30 p.m. Monday - Friday. Please note that voicemails left after 4:00 p.m. may not be returned until the following business day.  We are closed weekends and major holidays. You have access to a nurse at all times for urgent questions. Please call the main number to the clinic Dept: [phone] and follow the prompts.   For any non-urgent questions, you may also contact your provider using MyChart. We now offer e-Visits for anyone 18 and older to request care online for non-urgent symptoms. For details visit [email or web address].   Also download the MyChart app! Go to the app store, search "MyChart", open the app, select [place], and log in with your MyChart username and password.   

## 2022-08-06 ENCOUNTER — Inpatient Hospital Stay: Payer: 59

## 2022-08-06 ENCOUNTER — Ambulatory Visit
Admission: RE | Admit: 2022-08-06 | Discharge: 2022-08-06 | Disposition: A | Discharge status: Home / Self Care | Payer: 59 | Source: Ambulatory Visit | Attending: Radiation Oncology | Admitting: Radiation Oncology

## 2022-08-06 ENCOUNTER — Other Ambulatory Visit: Payer: Self-pay

## 2022-08-06 VITALS — BP 97/84 | HR 81 | Temp 97.9°F | Resp 18

## 2022-08-06 DIAGNOSIS — C3432 Malignant neoplasm of lower lobe, left bronchus or lung: Secondary | ICD-10-CM

## 2022-08-06 DIAGNOSIS — D649 Anemia, unspecified: Secondary | ICD-10-CM | POA: Diagnosis not present

## 2022-08-06 DIAGNOSIS — Z5189 Encounter for other specified aftercare: Secondary | ICD-10-CM | POA: Diagnosis not present

## 2022-08-06 DIAGNOSIS — Z87891 Personal history of nicotine dependence: Secondary | ICD-10-CM | POA: Diagnosis not present

## 2022-08-06 DIAGNOSIS — C778 Secondary and unspecified malignant neoplasm of lymph nodes of multiple regions: Secondary | ICD-10-CM | POA: Diagnosis not present

## 2022-08-06 DIAGNOSIS — D6481 Anemia due to antineoplastic chemotherapy: Secondary | ICD-10-CM | POA: Diagnosis not present

## 2022-08-06 DIAGNOSIS — Z79899 Other long term (current) drug therapy: Secondary | ICD-10-CM | POA: Diagnosis not present

## 2022-08-06 DIAGNOSIS — Z5111 Encounter for antineoplastic chemotherapy: Secondary | ICD-10-CM | POA: Diagnosis not present

## 2022-08-06 DIAGNOSIS — Z510 Encounter for antineoplastic radiation therapy: Secondary | ICD-10-CM | POA: Diagnosis not present

## 2022-08-06 LAB — RAD ONC ARIA SESSION SUMMARY
Course Elapsed Days: 2
Plan Fractions Treated to Date: 3
Plan Prescribed Dose Per Fraction: 2 Gy
Plan Total Fractions Prescribed: 30
Plan Total Prescribed Dose: 60 Gy
Reference Point Dosage Given to Date: 6 Gy
Reference Point Session Dosage Given: 2 Gy
Session Number: 3

## 2022-08-06 MED ORDER — SODIUM CHLORIDE 0.9% FLUSH
10.0000 mL | INTRAVENOUS | Status: DC | PRN
  Administered 2022-08-06: 10 mL

## 2022-08-06 MED ORDER — HEPARIN SOD (PORK) LOCK FLUSH 100 UNIT/ML IV SOLN
500.0000 [IU] | Freq: Once | INTRAVENOUS | Status: AC | PRN
  Administered 2022-08-06: 500 [IU]

## 2022-08-06 MED ORDER — SODIUM CHLORIDE 0.9 % IV SOLN
10.0000 mg | Freq: Once | INTRAVENOUS | Status: AC
  Administered 2022-08-06: 10 mg via INTRAVENOUS
  Filled 2022-08-06: qty 10

## 2022-08-06 MED ORDER — SODIUM CHLORIDE 0.9 % IV SOLN: Freq: Once | INTRAVENOUS | Status: AC

## 2022-08-06 MED ORDER — SODIUM CHLORIDE 0.9 % IV SOLN
100.0000 mg/m2 | Freq: Once | INTRAVENOUS | Status: AC
  Administered 2022-08-06: 142 mg via INTRAVENOUS
  Filled 2022-08-06: qty 7.1

## 2022-08-06 NOTE — Patient Instructions (Signed)
[place] [facility]  Discharge Instructions: Thank you for choosing [facility] to provide your oncology and hematology care.   If you have a lab appointment with the Cancer Center, please go directly to the Cancer Center and check in at the registration area.   Wear comfortable clothing and clothing appropriate for easy access to any Portacath or PICC line.   We strive to give you quality time with your provider. You may need to reschedule your appointment if you arrive late (15 or more minutes).  Arriving late affects you and other patients whose appointments are after yours.  Also, if you miss three or more appointments without notifying the office, you may be dismissed from the clinic at the provider's discretion.      For prescription refill requests, have your pharmacy contact our office and allow 72 hours for refills to be completed.    Today you received the following chemotherapy and/or immunotherapy agents; Etoposide      To help prevent nausea and vomiting after your treatment, we encourage you to take your nausea medication as directed.  BELOW ARE SYMPTOMS THAT SHOULD BE REPORTED IMMEDIATELY: *FEVER GREATER THAN 100.4 F (38 C) OR HIGHER *CHILLS OR SWEATING *NAUSEA AND VOMITING THAT IS NOT CONTROLLED WITH YOUR NAUSEA MEDICATION *UNUSUAL SHORTNESS OF BREATH *UNUSUAL BRUISING OR BLEEDING *URINARY PROBLEMS (pain or burning when urinating, or frequent urination) *BOWEL PROBLEMS (unusual diarrhea, constipation, pain near the anus) TENDERNESS IN MOUTH AND THROAT WITH OR WITHOUT PRESENCE OF ULCERS (sore throat, sores in mouth, or a toothache) UNUSUAL RASH, SWELLING OR PAIN  UNUSUAL VAGINAL DISCHARGE OR ITCHING   Items with * indicate a potential emergency and should be followed up as soon as possible or go to the Emergency Department if any problems should occur.  Please show the CHEMOTHERAPY ALERT CARD or IMMUNOTHERAPY ALERT CARD at  check-in to the Emergency Department and triage nurse.  Should you have questions after your visit or need to cancel or reschedule your appointment, please contact [place] [facility]  Dept: [phone]  and follow the prompts.  Office hours are 8:00 a.m. to 4:30 p.m. Monday - Friday. Please note that voicemails left after 4:00 p.m. may not be returned until the following business day.  We are closed weekends and major holidays. You have access to a nurse at all times for urgent questions. Please call the main number to the clinic Dept: [phone] and follow the prompts.   For any non-urgent questions, you may also contact your provider using MyChart. We now offer e-Visits for anyone 18 and older to request care online for non-urgent symptoms. For details visit [email or web address].   Also download the MyChart app! Go to the app store, search "MyChart", open the app, select [place], and log in with your MyChart username and password.   

## 2022-08-07 ENCOUNTER — Ambulatory Visit
Admission: RE | Admit: 2022-08-07 | Discharge: 2022-08-07 | Disposition: A | Discharge status: Home / Self Care | Payer: 59 | Source: Ambulatory Visit | Attending: Radiation Oncology | Admitting: Radiation Oncology

## 2022-08-07 ENCOUNTER — Inpatient Hospital Stay: Payer: 59 | Admitting: Licensed Clinical Social Worker

## 2022-08-07 ENCOUNTER — Other Ambulatory Visit: Payer: Self-pay

## 2022-08-07 DIAGNOSIS — Z79899 Other long term (current) drug therapy: Secondary | ICD-10-CM | POA: Diagnosis not present

## 2022-08-07 DIAGNOSIS — C3432 Malignant neoplasm of lower lobe, left bronchus or lung: Secondary | ICD-10-CM

## 2022-08-07 DIAGNOSIS — Z87891 Personal history of nicotine dependence: Secondary | ICD-10-CM | POA: Diagnosis not present

## 2022-08-07 DIAGNOSIS — D6481 Anemia due to antineoplastic chemotherapy: Secondary | ICD-10-CM | POA: Diagnosis not present

## 2022-08-07 DIAGNOSIS — C778 Secondary and unspecified malignant neoplasm of lymph nodes of multiple regions: Secondary | ICD-10-CM | POA: Diagnosis not present

## 2022-08-07 DIAGNOSIS — Z510 Encounter for antineoplastic radiation therapy: Secondary | ICD-10-CM | POA: Diagnosis not present

## 2022-08-07 DIAGNOSIS — Z5189 Encounter for other specified aftercare: Secondary | ICD-10-CM | POA: Diagnosis not present

## 2022-08-07 DIAGNOSIS — D649 Anemia, unspecified: Secondary | ICD-10-CM | POA: Diagnosis not present

## 2022-08-07 DIAGNOSIS — Z5111 Encounter for antineoplastic chemotherapy: Secondary | ICD-10-CM | POA: Diagnosis not present

## 2022-08-07 LAB — RAD ONC ARIA SESSION SUMMARY
Course Elapsed Days: 3
Plan Fractions Treated to Date: 4
Plan Prescribed Dose Per Fraction: 2 Gy
Plan Total Fractions Prescribed: 30
Plan Total Prescribed Dose: 60 Gy
Reference Point Dosage Given to Date: 8 Gy
Reference Point Session Dosage Given: 2 Gy
Session Number: 4

## 2022-08-07 MED ORDER — SONAFINE EX EMUL
1.0000 | Freq: Once | CUTANEOUS | Status: AC
  Administered 2022-08-07: 1 via TOPICAL

## 2022-08-07 NOTE — Progress Notes (Signed)
[facility] CSW Progress Note  Visual merchandiser  received a referral for pt requesting assistance with obtaining a wheelchair.  CSW attempted to call pt w/ no answer.  A voicemail was left with information for the [employer] as well as contact information for CSW to request Dr. Arbutus Ped write a prescription.      Rachel Moulds, LCSW Clinical Social Worker [facility]

## 2022-08-08 ENCOUNTER — Inpatient Hospital Stay: Payer: 59

## 2022-08-08 VITALS — BP 121/55 | HR 69 | Temp 97.9°F | Resp 18

## 2022-08-08 DIAGNOSIS — D6481 Anemia due to antineoplastic chemotherapy: Secondary | ICD-10-CM | POA: Diagnosis not present

## 2022-08-08 DIAGNOSIS — Z5111 Encounter for antineoplastic chemotherapy: Secondary | ICD-10-CM | POA: Diagnosis not present

## 2022-08-08 DIAGNOSIS — C3432 Malignant neoplasm of lower lobe, left bronchus or lung: Secondary | ICD-10-CM | POA: Diagnosis not present

## 2022-08-08 DIAGNOSIS — Z79899 Other long term (current) drug therapy: Secondary | ICD-10-CM | POA: Diagnosis not present

## 2022-08-08 DIAGNOSIS — Z5189 Encounter for other specified aftercare: Secondary | ICD-10-CM | POA: Diagnosis not present

## 2022-08-08 DIAGNOSIS — Z510 Encounter for antineoplastic radiation therapy: Secondary | ICD-10-CM | POA: Diagnosis not present

## 2022-08-08 DIAGNOSIS — Z87891 Personal history of nicotine dependence: Secondary | ICD-10-CM | POA: Diagnosis not present

## 2022-08-08 DIAGNOSIS — D649 Anemia, unspecified: Secondary | ICD-10-CM | POA: Diagnosis not present

## 2022-08-08 DIAGNOSIS — C778 Secondary and unspecified malignant neoplasm of lymph nodes of multiple regions: Secondary | ICD-10-CM | POA: Diagnosis not present

## 2022-08-08 MED ORDER — PEGFILGRASTIM-CBQV 6 MG/0.6ML ~~LOC~~ SOSY
6.0000 mg | PREFILLED_SYRINGE | Freq: Once | SUBCUTANEOUS | Status: AC
  Administered 2022-08-08: 6 mg via SUBCUTANEOUS
  Filled 2022-08-08: qty 0.6

## 2022-08-08 NOTE — Progress Notes (Signed)
Pt arrived to [facility] today for G-CSF. She is c/o RUQ pain 5/10. She states sometimes movement exacerbates the pain. She denies constipation/diarrhea and reports her stools are "normal" in form & color. Denies difficulty with pain after eating. Advised pt this LPN would make MD/PA aware of concern. She was advised to go to ED if she experiences abd distension/worsening pain.

## 2022-08-10 ENCOUNTER — Other Ambulatory Visit: Payer: Self-pay | Admitting: Physician Assistant

## 2022-08-10 ENCOUNTER — Encounter: Payer: Self-pay | Admitting: Internal Medicine

## 2022-08-10 ENCOUNTER — Other Ambulatory Visit: Payer: Self-pay

## 2022-08-10 ENCOUNTER — Ambulatory Visit
Admission: RE | Admit: 2022-08-10 | Discharge: 2022-08-10 | Disposition: A | Discharge status: Home / Self Care | Payer: 59 | Source: Ambulatory Visit | Attending: Radiation Oncology | Admitting: Radiation Oncology

## 2022-08-10 ENCOUNTER — Inpatient Hospital Stay: Payer: 59 | Admitting: Licensed Clinical Social Worker

## 2022-08-10 DIAGNOSIS — Z5189 Encounter for other specified aftercare: Secondary | ICD-10-CM | POA: Diagnosis not present

## 2022-08-10 DIAGNOSIS — C778 Secondary and unspecified malignant neoplasm of lymph nodes of multiple regions: Secondary | ICD-10-CM | POA: Diagnosis not present

## 2022-08-10 DIAGNOSIS — C3432 Malignant neoplasm of lower lobe, left bronchus or lung: Secondary | ICD-10-CM

## 2022-08-10 DIAGNOSIS — Z5111 Encounter for antineoplastic chemotherapy: Secondary | ICD-10-CM | POA: Diagnosis not present

## 2022-08-10 DIAGNOSIS — Z510 Encounter for antineoplastic radiation therapy: Secondary | ICD-10-CM | POA: Diagnosis not present

## 2022-08-10 DIAGNOSIS — D649 Anemia, unspecified: Secondary | ICD-10-CM | POA: Diagnosis not present

## 2022-08-10 DIAGNOSIS — Z87891 Personal history of nicotine dependence: Secondary | ICD-10-CM | POA: Diagnosis not present

## 2022-08-10 DIAGNOSIS — Z79899 Other long term (current) drug therapy: Secondary | ICD-10-CM | POA: Diagnosis not present

## 2022-08-10 DIAGNOSIS — D6481 Anemia due to antineoplastic chemotherapy: Secondary | ICD-10-CM | POA: Diagnosis not present

## 2022-08-10 LAB — RAD ONC ARIA SESSION SUMMARY
Course Elapsed Days: 6
Plan Fractions Treated to Date: 5
Plan Prescribed Dose Per Fraction: 2 Gy
Plan Total Fractions Prescribed: 30
Plan Total Prescribed Dose: 60 Gy
Reference Point Dosage Given to Date: 10 Gy
Reference Point Session Dosage Given: 2 Gy
Session Number: 5

## 2022-08-10 NOTE — Progress Notes (Signed)
[facility] CSW Progress Note  Visual merchandiser  spoke with pt and spouse over the phone regarding request for assistance obtaining a wheelchair.  Pt states she had back surgery 12 years ago and has back pain as well as difficulty with ambulation since that time which has progressively gotten worse.  According to pt her bones are very brittle and she has several fractures.  Pt states she has not been to the grocery store in 10 years as ambulation is too difficult.  Pt advised that Dr. Arbutus Ped is treating her cancer which is unrelated to her back issue.  In order to submit the appropriate supporting documentation for a wheelchair pt advised to reach out to her PCP or neurosurgeon if she is still being followed by the neurosurgeon.  Pt resides with her spouse and the couple does not have any family locally who are able to assist.  Pt's sister will be visiting in a couple of days and will stay for approximately a week.  Pt states she is scheduled for an in home assessment through Medicaid in August.  CSW advised pt explore a PCA through her Medicaid services as well in order to have more assistance in the home.  Pt and spouse verbalized understanding and will reach out to the PCP regarding the wheelchair.  CSW to remain available as appropriate throughout duration of treatment to provide support.        Rachel Moulds, LCSW Clinical Social Worker [facility]

## 2022-08-11 ENCOUNTER — Ambulatory Visit
Admission: RE | Admit: 2022-08-11 | Discharge: 2022-08-11 | Disposition: A | Discharge status: Home / Self Care | Payer: 59 | Source: Ambulatory Visit | Attending: Radiation Oncology | Admitting: Radiation Oncology

## 2022-08-11 ENCOUNTER — Inpatient Hospital Stay: Payer: 59

## 2022-08-11 ENCOUNTER — Other Ambulatory Visit: Payer: Self-pay

## 2022-08-11 ENCOUNTER — Encounter: Payer: Self-pay | Admitting: Internal Medicine

## 2022-08-11 DIAGNOSIS — C778 Secondary and unspecified malignant neoplasm of lymph nodes of multiple regions: Secondary | ICD-10-CM | POA: Diagnosis not present

## 2022-08-11 DIAGNOSIS — Z79899 Other long term (current) drug therapy: Secondary | ICD-10-CM | POA: Diagnosis not present

## 2022-08-11 DIAGNOSIS — Z5111 Encounter for antineoplastic chemotherapy: Secondary | ICD-10-CM | POA: Diagnosis not present

## 2022-08-11 DIAGNOSIS — Z510 Encounter for antineoplastic radiation therapy: Secondary | ICD-10-CM | POA: Diagnosis not present

## 2022-08-11 DIAGNOSIS — Z87891 Personal history of nicotine dependence: Secondary | ICD-10-CM | POA: Diagnosis not present

## 2022-08-11 DIAGNOSIS — C3432 Malignant neoplasm of lower lobe, left bronchus or lung: Secondary | ICD-10-CM

## 2022-08-11 DIAGNOSIS — D649 Anemia, unspecified: Secondary | ICD-10-CM | POA: Diagnosis not present

## 2022-08-11 DIAGNOSIS — Z95828 Presence of other vascular implants and grafts: Secondary | ICD-10-CM

## 2022-08-11 DIAGNOSIS — D6481 Anemia due to antineoplastic chemotherapy: Secondary | ICD-10-CM | POA: Diagnosis not present

## 2022-08-11 DIAGNOSIS — Z5189 Encounter for other specified aftercare: Secondary | ICD-10-CM | POA: Diagnosis not present

## 2022-08-11 LAB — RAD ONC ARIA SESSION SUMMARY
Course Elapsed Days: 7
Plan Fractions Treated to Date: 6
Plan Prescribed Dose Per Fraction: 2 Gy
Plan Total Fractions Prescribed: 30
Plan Total Prescribed Dose: 60 Gy
Reference Point Dosage Given to Date: 12 Gy
Reference Point Session Dosage Given: 2 Gy
Session Number: 6

## 2022-08-11 LAB — CBC WITH DIFFERENTIAL (CANCER CENTER ONLY)
Abs Immature Granulocytes: 0 10*3/uL (ref 0.00–0.07)
Basophils Absolute: 0 10*3/uL (ref 0.0–0.1)
Basophils Relative: 0 %
Eosinophils Absolute: 0 10*3/uL (ref 0.0–0.5)
Eosinophils Relative: 0 %
HCT: 28.6 % — ABNORMAL LOW (ref 36.0–46.0)
Hemoglobin: 9.4 g/dL — ABNORMAL LOW (ref 12.0–15.0)
Lymphocytes Relative: 8 %
Lymphs Abs: 0.5 10*3/uL — ABNORMAL LOW (ref 0.7–4.0)
MCH: 31.2 pg (ref 26.0–34.0)
MCHC: 32.9 g/dL (ref 30.0–36.0)
MCV: 95 fL (ref 80.0–100.0)
Monocytes Absolute: 0 10*3/uL — ABNORMAL LOW (ref 0.1–1.0)
Monocytes Relative: 0 %
Neutro Abs: 6 10*3/uL (ref 1.7–7.7)
Neutrophils Relative %: 92 %
Platelet Count: 324 10*3/uL (ref 150–400)
RBC: 3.01 MIL/uL — ABNORMAL LOW (ref 3.87–5.11)
RDW: 13.1 % (ref 11.5–15.5)
Smear Review: NORMAL
WBC Count: 6.5 10*3/uL (ref 4.0–10.5)
nRBC: 0 % (ref 0.0–0.2)

## 2022-08-11 LAB — CMP (CANCER CENTER ONLY)
ALT: 9 U/L (ref 0–44)
AST: 12 U/L — ABNORMAL LOW (ref 15–41)
Albumin: 4 g/dL (ref 3.5–5.0)
Alkaline Phosphatase: 98 U/L (ref 38–126)
Anion gap: 8 (ref 5–15)
BUN: 16 mg/dL (ref 8–23)
CO2: 29 mmol/L (ref 22–32)
Calcium: 9.2 mg/dL (ref 8.9–10.3)
Chloride: 98 mmol/L (ref 98–111)
Creatinine: 0.81 mg/dL (ref 0.44–1.00)
GFR, Estimated: >60 mL/min (ref >60)
Glucose, Bld: 127 mg/dL — ABNORMAL HIGH (ref 70–99)
Potassium: 4.4 mmol/L (ref 3.5–5.1)
Sodium: 135 mmol/L (ref 135–145)
Total Bilirubin: 1 mg/dL (ref 0.3–1.2)
Total Protein: 7 g/dL (ref 6.5–8.1)

## 2022-08-11 MED ORDER — SODIUM CHLORIDE 0.9% FLUSH
10.0000 mL | Freq: Once | INTRAVENOUS | Status: AC
  Administered 2022-08-11: 10 mL

## 2022-08-11 MED ORDER — HEPARIN SOD (PORK) LOCK FLUSH 100 UNIT/ML IV SOLN
500.0000 [IU] | Freq: Once | INTRAVENOUS | Status: AC
  Administered 2022-08-11: 500 [IU]

## 2022-08-12 ENCOUNTER — Other Ambulatory Visit: Payer: Self-pay

## 2022-08-12 ENCOUNTER — Ambulatory Visit
Admission: RE | Admit: 2022-08-12 | Discharge: 2022-08-12 | Disposition: A | Discharge status: Home / Self Care | Payer: 59 | Source: Ambulatory Visit | Attending: Radiation Oncology | Admitting: Radiation Oncology

## 2022-08-12 DIAGNOSIS — D6481 Anemia due to antineoplastic chemotherapy: Secondary | ICD-10-CM | POA: Diagnosis not present

## 2022-08-12 DIAGNOSIS — C3432 Malignant neoplasm of lower lobe, left bronchus or lung: Secondary | ICD-10-CM | POA: Diagnosis not present

## 2022-08-12 DIAGNOSIS — Z5189 Encounter for other specified aftercare: Secondary | ICD-10-CM | POA: Diagnosis not present

## 2022-08-12 DIAGNOSIS — D649 Anemia, unspecified: Secondary | ICD-10-CM | POA: Diagnosis not present

## 2022-08-12 DIAGNOSIS — Z5111 Encounter for antineoplastic chemotherapy: Secondary | ICD-10-CM | POA: Diagnosis not present

## 2022-08-12 DIAGNOSIS — Z510 Encounter for antineoplastic radiation therapy: Secondary | ICD-10-CM | POA: Diagnosis not present

## 2022-08-12 DIAGNOSIS — C778 Secondary and unspecified malignant neoplasm of lymph nodes of multiple regions: Secondary | ICD-10-CM | POA: Diagnosis not present

## 2022-08-12 DIAGNOSIS — Z79899 Other long term (current) drug therapy: Secondary | ICD-10-CM | POA: Diagnosis not present

## 2022-08-12 DIAGNOSIS — Z87891 Personal history of nicotine dependence: Secondary | ICD-10-CM | POA: Diagnosis not present

## 2022-08-12 LAB — RAD ONC ARIA SESSION SUMMARY
Course Elapsed Days: 8
Plan Fractions Treated to Date: 7
Plan Prescribed Dose Per Fraction: 2 Gy
Plan Total Fractions Prescribed: 30
Plan Total Prescribed Dose: 60 Gy
Reference Point Dosage Given to Date: 14 Gy
Reference Point Session Dosage Given: 2 Gy
Session Number: 7

## 2022-08-13 ENCOUNTER — Other Ambulatory Visit: Payer: Self-pay

## 2022-08-13 ENCOUNTER — Telehealth: Payer: Self-pay | Admitting: Family Medicine

## 2022-08-13 ENCOUNTER — Ambulatory Visit
Admission: RE | Admit: 2022-08-13 | Discharge: 2022-08-13 | Disposition: A | Discharge status: Home / Self Care | Payer: 59 | Source: Ambulatory Visit | Attending: Radiation Oncology | Admitting: Radiation Oncology

## 2022-08-13 DIAGNOSIS — Z79899 Other long term (current) drug therapy: Secondary | ICD-10-CM | POA: Diagnosis not present

## 2022-08-13 DIAGNOSIS — D6481 Anemia due to antineoplastic chemotherapy: Secondary | ICD-10-CM | POA: Diagnosis not present

## 2022-08-13 DIAGNOSIS — Z5111 Encounter for antineoplastic chemotherapy: Secondary | ICD-10-CM | POA: Diagnosis not present

## 2022-08-13 DIAGNOSIS — Z510 Encounter for antineoplastic radiation therapy: Secondary | ICD-10-CM | POA: Diagnosis not present

## 2022-08-13 DIAGNOSIS — C778 Secondary and unspecified malignant neoplasm of lymph nodes of multiple regions: Secondary | ICD-10-CM | POA: Diagnosis not present

## 2022-08-13 DIAGNOSIS — Z87891 Personal history of nicotine dependence: Secondary | ICD-10-CM | POA: Diagnosis not present

## 2022-08-13 DIAGNOSIS — C3432 Malignant neoplasm of lower lobe, left bronchus or lung: Secondary | ICD-10-CM | POA: Diagnosis not present

## 2022-08-13 DIAGNOSIS — D649 Anemia, unspecified: Secondary | ICD-10-CM | POA: Diagnosis not present

## 2022-08-13 DIAGNOSIS — Z5189 Encounter for other specified aftercare: Secondary | ICD-10-CM | POA: Diagnosis not present

## 2022-08-13 LAB — RAD ONC ARIA SESSION SUMMARY
Course Elapsed Days: 9
Plan Fractions Treated to Date: 8
Plan Prescribed Dose Per Fraction: 2 Gy
Plan Total Fractions Prescribed: 30
Plan Total Prescribed Dose: 60 Gy
Reference Point Dosage Given to Date: 16 Gy
Reference Point Session Dosage Given: 2 Gy
Session Number: 8

## 2022-08-13 NOTE — Telephone Encounter (Signed)
Pts husband called and states if we can sent RX to [facility] for transport chair and then a power wheel chair at a later time.He will fax [place] some paperwork to be filled.

## 2022-08-14 ENCOUNTER — Other Ambulatory Visit: Payer: Self-pay

## 2022-08-14 ENCOUNTER — Ambulatory Visit
Admission: RE | Admit: 2022-08-14 | Discharge: 2022-08-14 | Disposition: A | Discharge status: Home / Self Care | Payer: 59 | Source: Ambulatory Visit | Attending: Radiation Oncology | Admitting: Radiation Oncology

## 2022-08-14 DIAGNOSIS — Z87891 Personal history of nicotine dependence: Secondary | ICD-10-CM | POA: Diagnosis not present

## 2022-08-14 DIAGNOSIS — D649 Anemia, unspecified: Secondary | ICD-10-CM | POA: Diagnosis not present

## 2022-08-14 DIAGNOSIS — C3432 Malignant neoplasm of lower lobe, left bronchus or lung: Secondary | ICD-10-CM

## 2022-08-14 DIAGNOSIS — Z510 Encounter for antineoplastic radiation therapy: Secondary | ICD-10-CM | POA: Diagnosis not present

## 2022-08-14 DIAGNOSIS — Z79899 Other long term (current) drug therapy: Secondary | ICD-10-CM | POA: Diagnosis not present

## 2022-08-14 DIAGNOSIS — C778 Secondary and unspecified malignant neoplasm of lymph nodes of multiple regions: Secondary | ICD-10-CM | POA: Diagnosis not present

## 2022-08-14 DIAGNOSIS — Z5111 Encounter for antineoplastic chemotherapy: Secondary | ICD-10-CM | POA: Diagnosis not present

## 2022-08-14 DIAGNOSIS — Z5189 Encounter for other specified aftercare: Secondary | ICD-10-CM | POA: Diagnosis not present

## 2022-08-14 DIAGNOSIS — D6481 Anemia due to antineoplastic chemotherapy: Secondary | ICD-10-CM | POA: Diagnosis not present

## 2022-08-14 LAB — RAD ONC ARIA SESSION SUMMARY
Course Elapsed Days: 10
Plan Fractions Treated to Date: 9
Plan Prescribed Dose Per Fraction: 2 Gy
Plan Total Fractions Prescribed: 30
Plan Total Prescribed Dose: 60 Gy
Reference Point Dosage Given to Date: 18 Gy
Reference Point Session Dosage Given: 2 Gy
Session Number: 9

## 2022-08-14 NOTE — Telephone Encounter (Signed)
Awaiting paperwork

## 2022-08-17 ENCOUNTER — Other Ambulatory Visit: Payer: Self-pay

## 2022-08-17 ENCOUNTER — Ambulatory Visit
Admission: RE | Admit: 2022-08-17 | Discharge: 2022-08-17 | Disposition: A | Discharge status: Home / Self Care | Payer: 59 | Source: Ambulatory Visit | Attending: Radiation Oncology | Admitting: Radiation Oncology

## 2022-08-17 DIAGNOSIS — C3432 Malignant neoplasm of lower lobe, left bronchus or lung: Secondary | ICD-10-CM | POA: Diagnosis not present

## 2022-08-17 DIAGNOSIS — Z5111 Encounter for antineoplastic chemotherapy: Secondary | ICD-10-CM | POA: Diagnosis not present

## 2022-08-17 DIAGNOSIS — Z79899 Other long term (current) drug therapy: Secondary | ICD-10-CM | POA: Diagnosis not present

## 2022-08-17 DIAGNOSIS — D649 Anemia, unspecified: Secondary | ICD-10-CM | POA: Diagnosis not present

## 2022-08-17 DIAGNOSIS — Z87891 Personal history of nicotine dependence: Secondary | ICD-10-CM | POA: Diagnosis not present

## 2022-08-17 DIAGNOSIS — Z5189 Encounter for other specified aftercare: Secondary | ICD-10-CM | POA: Diagnosis not present

## 2022-08-17 DIAGNOSIS — C778 Secondary and unspecified malignant neoplasm of lymph nodes of multiple regions: Secondary | ICD-10-CM | POA: Diagnosis not present

## 2022-08-17 DIAGNOSIS — Z510 Encounter for antineoplastic radiation therapy: Secondary | ICD-10-CM | POA: Diagnosis not present

## 2022-08-17 DIAGNOSIS — D6481 Anemia due to antineoplastic chemotherapy: Secondary | ICD-10-CM | POA: Diagnosis not present

## 2022-08-17 LAB — RAD ONC ARIA SESSION SUMMARY
Course Elapsed Days: 13
Plan Fractions Treated to Date: 10
Plan Prescribed Dose Per Fraction: 2 Gy
Plan Total Fractions Prescribed: 30
Plan Total Prescribed Dose: 60 Gy
Reference Point Dosage Given to Date: 20 Gy
Reference Point Session Dosage Given: 2 Gy
Session Number: 10

## 2022-08-17 NOTE — Telephone Encounter (Signed)
Paperwork received and order faxed to [facility].

## 2022-08-18 ENCOUNTER — Other Ambulatory Visit: Payer: Self-pay

## 2022-08-18 ENCOUNTER — Other Ambulatory Visit: Payer: Self-pay | Admitting: Physician Assistant

## 2022-08-18 ENCOUNTER — Ambulatory Visit
Admission: RE | Admit: 2022-08-18 | Discharge: 2022-08-18 | Disposition: A | Discharge status: Home / Self Care | Payer: 59 | Source: Ambulatory Visit | Attending: Radiation Oncology | Admitting: Radiation Oncology

## 2022-08-18 ENCOUNTER — Other Ambulatory Visit: Payer: Self-pay | Admitting: Medical Oncology

## 2022-08-18 ENCOUNTER — Inpatient Hospital Stay: Payer: 59

## 2022-08-18 ENCOUNTER — Telehealth: Payer: Self-pay

## 2022-08-18 DIAGNOSIS — Z510 Encounter for antineoplastic radiation therapy: Secondary | ICD-10-CM | POA: Diagnosis not present

## 2022-08-18 DIAGNOSIS — Z79899 Other long term (current) drug therapy: Secondary | ICD-10-CM | POA: Diagnosis not present

## 2022-08-18 DIAGNOSIS — Z87891 Personal history of nicotine dependence: Secondary | ICD-10-CM | POA: Diagnosis not present

## 2022-08-18 DIAGNOSIS — D649 Anemia, unspecified: Secondary | ICD-10-CM

## 2022-08-18 DIAGNOSIS — Z5111 Encounter for antineoplastic chemotherapy: Secondary | ICD-10-CM | POA: Diagnosis not present

## 2022-08-18 DIAGNOSIS — D6481 Anemia due to antineoplastic chemotherapy: Secondary | ICD-10-CM | POA: Diagnosis not present

## 2022-08-18 DIAGNOSIS — C3432 Malignant neoplasm of lower lobe, left bronchus or lung: Secondary | ICD-10-CM

## 2022-08-18 DIAGNOSIS — Z5189 Encounter for other specified aftercare: Secondary | ICD-10-CM | POA: Diagnosis not present

## 2022-08-18 DIAGNOSIS — C778 Secondary and unspecified malignant neoplasm of lymph nodes of multiple regions: Secondary | ICD-10-CM | POA: Diagnosis not present

## 2022-08-18 DIAGNOSIS — Z95828 Presence of other vascular implants and grafts: Secondary | ICD-10-CM

## 2022-08-18 LAB — CBC WITH DIFFERENTIAL (CANCER CENTER ONLY)
Abs Immature Granulocytes: 1.16 10*3/uL — ABNORMAL HIGH (ref 0.00–0.07)
Basophils Absolute: 0.1 10*3/uL (ref 0.0–0.1)
Basophils Relative: 0 %
Eosinophils Absolute: 0.2 10*3/uL (ref 0.0–0.5)
Eosinophils Relative: 1 %
HCT: 23.7 % — ABNORMAL LOW (ref 36.0–46.0)
Hemoglobin: 7.7 g/dL — ABNORMAL LOW (ref 12.0–15.0)
Immature Granulocytes: 5 %
Lymphocytes Relative: 5 %
Lymphs Abs: 1.2 10*3/uL (ref 0.7–4.0)
MCH: 30.9 pg (ref 26.0–34.0)
MCHC: 32.5 g/dL (ref 30.0–36.0)
MCV: 95.2 fL (ref 80.0–100.0)
Monocytes Absolute: 2.3 10*3/uL — ABNORMAL HIGH (ref 0.1–1.0)
Monocytes Relative: 10 %
Neutro Abs: 19.6 10*3/uL — ABNORMAL HIGH (ref 1.7–7.7)
Neutrophils Relative %: 79 %
Platelet Count: 36 10*3/uL — ABNORMAL LOW (ref 150–400)
RBC: 2.49 MIL/uL — ABNORMAL LOW (ref 3.87–5.11)
RDW: 12.7 % (ref 11.5–15.5)
WBC Count: 24.5 10*3/uL — ABNORMAL HIGH (ref 4.0–10.5)
nRBC: 0.1 % (ref 0.0–0.2)

## 2022-08-18 LAB — CMP (CANCER CENTER ONLY)
ALT: 8 U/L (ref 0–44)
AST: 12 U/L — ABNORMAL LOW (ref 15–41)
Albumin: 3.8 g/dL (ref 3.5–5.0)
Alkaline Phosphatase: 113 U/L (ref 38–126)
Anion gap: 9 (ref 5–15)
BUN: 17 mg/dL (ref 8–23)
CO2: 31 mmol/L (ref 22–32)
Calcium: 9.4 mg/dL (ref 8.9–10.3)
Chloride: 98 mmol/L (ref 98–111)
Creatinine: 1.08 mg/dL — ABNORMAL HIGH (ref 0.44–1.00)
GFR, Estimated: 54 mL/min — ABNORMAL LOW (ref >60)
Glucose, Bld: 137 mg/dL — ABNORMAL HIGH (ref 70–99)
Potassium: 3.8 mmol/L (ref 3.5–5.1)
Sodium: 138 mmol/L (ref 135–145)
Total Bilirubin: 0.3 mg/dL (ref 0.3–1.2)
Total Protein: 6.3 g/dL — ABNORMAL LOW (ref 6.5–8.1)

## 2022-08-18 LAB — RAD ONC ARIA SESSION SUMMARY
Course Elapsed Days: 14
Plan Fractions Treated to Date: 11
Plan Prescribed Dose Per Fraction: 2 Gy
Plan Total Fractions Prescribed: 30
Plan Total Prescribed Dose: 60 Gy
Reference Point Dosage Given to Date: 22 Gy
Reference Point Session Dosage Given: 2 Gy
Session Number: 11

## 2022-08-18 MED ORDER — SODIUM CHLORIDE 0.9% FLUSH
10.0000 mL | Freq: Once | INTRAVENOUS | Status: AC
  Administered 2022-08-18: 10 mL

## 2022-08-18 MED ORDER — HEPARIN SOD (PORK) LOCK FLUSH 100 UNIT/ML IV SOLN
500.0000 [IU] | Freq: Once | INTRAVENOUS | Status: AC
  Administered 2022-08-18: 500 [IU]

## 2022-08-18 NOTE — Telephone Encounter (Signed)
This nurse reached out to the patient related to lab results showing low blood counts.  Patient is in need of transfusion per provider.  No answer, left a message for patient to give [place] a call in the office to have transfusion scheduled.  No further questions or concerns at this time.

## 2022-08-18 NOTE — Telephone Encounter (Signed)
Syniya notified of new appts for tomorrow -lab and blood transfusion .

## 2022-08-18 NOTE — Progress Notes (Unsigned)
Blood transfusion ordered.

## 2022-08-19 ENCOUNTER — Other Ambulatory Visit: Payer: 59

## 2022-08-19 ENCOUNTER — Other Ambulatory Visit: Payer: Self-pay | Admitting: Physician Assistant

## 2022-08-19 ENCOUNTER — Ambulatory Visit: Admission: RE | Admit: 2022-08-19 | Payer: 59 | Source: Ambulatory Visit

## 2022-08-19 ENCOUNTER — Other Ambulatory Visit: Payer: Self-pay

## 2022-08-19 ENCOUNTER — Inpatient Hospital Stay: Payer: 59

## 2022-08-19 ENCOUNTER — Other Ambulatory Visit: Payer: Self-pay | Admitting: Radiation Oncology

## 2022-08-19 DIAGNOSIS — Z510 Encounter for antineoplastic radiation therapy: Secondary | ICD-10-CM | POA: Diagnosis not present

## 2022-08-19 DIAGNOSIS — D649 Anemia, unspecified: Secondary | ICD-10-CM | POA: Diagnosis not present

## 2022-08-19 DIAGNOSIS — C3432 Malignant neoplasm of lower lobe, left bronchus or lung: Secondary | ICD-10-CM

## 2022-08-19 DIAGNOSIS — Z5111 Encounter for antineoplastic chemotherapy: Secondary | ICD-10-CM | POA: Diagnosis not present

## 2022-08-19 DIAGNOSIS — Z79899 Other long term (current) drug therapy: Secondary | ICD-10-CM | POA: Diagnosis not present

## 2022-08-19 DIAGNOSIS — Z5189 Encounter for other specified aftercare: Secondary | ICD-10-CM | POA: Diagnosis not present

## 2022-08-19 DIAGNOSIS — Z95828 Presence of other vascular implants and grafts: Secondary | ICD-10-CM

## 2022-08-19 DIAGNOSIS — C778 Secondary and unspecified malignant neoplasm of lymph nodes of multiple regions: Secondary | ICD-10-CM | POA: Diagnosis not present

## 2022-08-19 DIAGNOSIS — D6481 Anemia due to antineoplastic chemotherapy: Secondary | ICD-10-CM | POA: Diagnosis not present

## 2022-08-19 DIAGNOSIS — Z87891 Personal history of nicotine dependence: Secondary | ICD-10-CM | POA: Diagnosis not present

## 2022-08-19 MED ORDER — HEPARIN SOD (PORK) LOCK FLUSH 100 UNIT/ML IV SOLN
500.0000 [IU] | Freq: Once | INTRAVENOUS | Status: AC
  Administered 2022-08-19: 500 [IU]

## 2022-08-19 MED ORDER — SODIUM CHLORIDE 0.9% FLUSH
10.0000 mL | Freq: Once | INTRAVENOUS | Status: AC
  Administered 2022-08-19: 10 mL

## 2022-08-20 ENCOUNTER — Other Ambulatory Visit: Payer: Self-pay

## 2022-08-20 ENCOUNTER — Ambulatory Visit
Admission: RE | Admit: 2022-08-20 | Discharge: 2022-08-20 | Disposition: A | Discharge status: Home / Self Care | Payer: 59 | Source: Ambulatory Visit | Attending: Radiation Oncology | Admitting: Radiation Oncology

## 2022-08-20 ENCOUNTER — Inpatient Hospital Stay: Payer: 59

## 2022-08-20 ENCOUNTER — Ambulatory Visit (HOSPITAL_COMMUNITY)
Admission: RE | Admit: 2022-08-20 | Discharge: 2022-08-20 | Disposition: A | Discharge status: Home / Self Care | Payer: 59 | Source: Ambulatory Visit | Attending: Physician Assistant | Admitting: Physician Assistant

## 2022-08-20 DIAGNOSIS — C3432 Malignant neoplasm of lower lobe, left bronchus or lung: Secondary | ICD-10-CM | POA: Insufficient documentation

## 2022-08-20 DIAGNOSIS — Z510 Encounter for antineoplastic radiation therapy: Secondary | ICD-10-CM | POA: Diagnosis not present

## 2022-08-20 DIAGNOSIS — Z87891 Personal history of nicotine dependence: Secondary | ICD-10-CM | POA: Diagnosis not present

## 2022-08-20 DIAGNOSIS — Z5189 Encounter for other specified aftercare: Secondary | ICD-10-CM | POA: Diagnosis not present

## 2022-08-20 DIAGNOSIS — C778 Secondary and unspecified malignant neoplasm of lymph nodes of multiple regions: Secondary | ICD-10-CM | POA: Diagnosis not present

## 2022-08-20 DIAGNOSIS — Z5111 Encounter for antineoplastic chemotherapy: Secondary | ICD-10-CM | POA: Diagnosis not present

## 2022-08-20 DIAGNOSIS — D6481 Anemia due to antineoplastic chemotherapy: Secondary | ICD-10-CM | POA: Diagnosis not present

## 2022-08-20 DIAGNOSIS — J439 Emphysema, unspecified: Secondary | ICD-10-CM | POA: Diagnosis not present

## 2022-08-20 DIAGNOSIS — D649 Anemia, unspecified: Secondary | ICD-10-CM | POA: Diagnosis not present

## 2022-08-20 DIAGNOSIS — C3490 Malignant neoplasm of unspecified part of unspecified bronchus or lung: Secondary | ICD-10-CM | POA: Diagnosis not present

## 2022-08-20 DIAGNOSIS — Z79899 Other long term (current) drug therapy: Secondary | ICD-10-CM | POA: Diagnosis not present

## 2022-08-20 LAB — CBC WITH DIFFERENTIAL (CANCER CENTER ONLY)
Abs Immature Granulocytes: 0.89 10*3/uL — ABNORMAL HIGH (ref 0.00–0.07)
Basophils Absolute: 0.1 10*3/uL (ref 0.0–0.1)
Basophils Relative: 0 %
Eosinophils Absolute: 0.1 10*3/uL (ref 0.0–0.5)
Eosinophils Relative: 1 %
HCT: 23.2 % — ABNORMAL LOW (ref 36.0–46.0)
Hemoglobin: 7.7 g/dL — ABNORMAL LOW (ref 12.0–15.0)
Immature Granulocytes: 4 %
Lymphocytes Relative: 6 %
Lymphs Abs: 1.4 10*3/uL (ref 0.7–4.0)
MCH: 31.4 pg (ref 26.0–34.0)
MCHC: 33.2 g/dL (ref 30.0–36.0)
MCV: 94.7 fL (ref 80.0–100.0)
Monocytes Absolute: 2.1 10*3/uL — ABNORMAL HIGH (ref 0.1–1.0)
Monocytes Relative: 8 %
Neutro Abs: 20.9 10*3/uL — ABNORMAL HIGH (ref 1.7–7.7)
Neutrophils Relative %: 81 %
Platelet Count: 77 10*3/uL — ABNORMAL LOW (ref 150–400)
RBC: 2.45 MIL/uL — ABNORMAL LOW (ref 3.87–5.11)
RDW: 12.9 % (ref 11.5–15.5)
WBC Count: 25.5 10*3/uL — ABNORMAL HIGH (ref 4.0–10.5)
nRBC: 0 % (ref 0.0–0.2)

## 2022-08-20 LAB — RAD ONC ARIA SESSION SUMMARY
Course Elapsed Days: 16
Plan Fractions Treated to Date: 12
Plan Prescribed Dose Per Fraction: 2 Gy
Plan Total Fractions Prescribed: 30
Plan Total Prescribed Dose: 60 Gy
Reference Point Dosage Given to Date: 24 Gy
Reference Point Session Dosage Given: 2 Gy
Session Number: 12

## 2022-08-20 LAB — ABO/RH: ABO/RH(D): A NEG

## 2022-08-20 MED ORDER — SODIUM CHLORIDE (PF) 0.9 % IJ SOLN
INTRAMUSCULAR | Status: AC
  Filled 2022-08-20: qty 50

## 2022-08-20 MED ORDER — IOHEXOL 300 MG/ML  SOLN
75.0000 mL | Freq: Once | INTRAMUSCULAR | Status: AC | PRN
  Administered 2022-08-20: 75 mL via INTRAVENOUS

## 2022-08-21 ENCOUNTER — Other Ambulatory Visit: Payer: 59

## 2022-08-21 ENCOUNTER — Inpatient Hospital Stay: Payer: 59

## 2022-08-21 ENCOUNTER — Ambulatory Visit
Admission: RE | Admit: 2022-08-21 | Discharge: 2022-08-21 | Disposition: A | Discharge status: Home / Self Care | Payer: 59 | Source: Ambulatory Visit | Attending: Radiation Oncology | Admitting: Radiation Oncology

## 2022-08-21 ENCOUNTER — Other Ambulatory Visit: Payer: Self-pay | Admitting: Radiation Oncology

## 2022-08-21 ENCOUNTER — Other Ambulatory Visit: Payer: Self-pay

## 2022-08-21 DIAGNOSIS — Z5189 Encounter for other specified aftercare: Secondary | ICD-10-CM | POA: Diagnosis not present

## 2022-08-21 DIAGNOSIS — Z79899 Other long term (current) drug therapy: Secondary | ICD-10-CM | POA: Diagnosis not present

## 2022-08-21 DIAGNOSIS — Z510 Encounter for antineoplastic radiation therapy: Secondary | ICD-10-CM | POA: Diagnosis not present

## 2022-08-21 DIAGNOSIS — C778 Secondary and unspecified malignant neoplasm of lymph nodes of multiple regions: Secondary | ICD-10-CM | POA: Diagnosis not present

## 2022-08-21 DIAGNOSIS — Z87891 Personal history of nicotine dependence: Secondary | ICD-10-CM | POA: Diagnosis not present

## 2022-08-21 DIAGNOSIS — D6481 Anemia due to antineoplastic chemotherapy: Secondary | ICD-10-CM | POA: Diagnosis not present

## 2022-08-21 DIAGNOSIS — C3432 Malignant neoplasm of lower lobe, left bronchus or lung: Secondary | ICD-10-CM | POA: Diagnosis not present

## 2022-08-21 DIAGNOSIS — Z5111 Encounter for antineoplastic chemotherapy: Secondary | ICD-10-CM | POA: Diagnosis not present

## 2022-08-21 DIAGNOSIS — D649 Anemia, unspecified: Secondary | ICD-10-CM | POA: Diagnosis not present

## 2022-08-21 LAB — RAD ONC ARIA SESSION SUMMARY
Course Elapsed Days: 17
Plan Fractions Treated to Date: 13
Plan Prescribed Dose Per Fraction: 2 Gy
Plan Total Fractions Prescribed: 30
Plan Total Prescribed Dose: 60 Gy
Reference Point Dosage Given to Date: 26 Gy
Reference Point Session Dosage Given: 2 Gy
Session Number: 13

## 2022-08-21 LAB — TYPE AND SCREEN
Antibody Screen: NEGATIVE
Unit division: 0

## 2022-08-21 LAB — PREPARE RBC (CROSSMATCH)

## 2022-08-21 LAB — BPAM RBC: Unit Type and Rh: 600

## 2022-08-21 MED ORDER — SODIUM CHLORIDE 0.9% IV SOLUTION
250.0000 mL | Freq: Once | INTRAVENOUS | Status: AC
  Administered 2022-08-21: 250 mL via INTRAVENOUS

## 2022-08-21 MED ORDER — SUCRALFATE 1 G PO TABS: 1.0000 g | ORAL_TABLET | Freq: Four times a day (QID) | ORAL | 2 refills | Status: DC

## 2022-08-21 MED ORDER — SODIUM CHLORIDE 0.9% FLUSH
10.0000 mL | INTRAVENOUS | Status: AC | PRN
  Administered 2022-08-21: 10 mL

## 2022-08-21 MED ORDER — ACETAMINOPHEN 325 MG PO TABS
650.0000 mg | ORAL_TABLET | Freq: Once | ORAL | Status: AC
  Administered 2022-08-21: 650 mg via ORAL
  Filled 2022-08-21: qty 2

## 2022-08-21 MED ORDER — DIPHENHYDRAMINE HCL 25 MG PO CAPS
25.0000 mg | ORAL_CAPSULE | Freq: Once | ORAL | Status: DC
  Filled 2022-08-21: qty 1

## 2022-08-21 MED ORDER — HEPARIN SOD (PORK) LOCK FLUSH 100 UNIT/ML IV SOLN
250.0000 [IU] | INTRAVENOUS | Status: AC | PRN
  Administered 2022-08-21: 250 [IU]

## 2022-08-22 LAB — TYPE AND SCREEN: ABO/RH(D): A NEG

## 2022-08-22 LAB — BPAM RBC
Blood Product Expiration Date: 202407112359
ISSUE DATE / TIME: 202406211330

## 2022-08-24 ENCOUNTER — Inpatient Hospital Stay: Payer: 59

## 2022-08-24 ENCOUNTER — Ambulatory Visit
Admission: RE | Admit: 2022-08-24 | Discharge: 2022-08-24 | Disposition: A | Discharge status: Home / Self Care | Payer: 59 | Source: Ambulatory Visit | Attending: Radiation Oncology | Admitting: Radiation Oncology

## 2022-08-24 ENCOUNTER — Other Ambulatory Visit: Payer: Self-pay

## 2022-08-24 DIAGNOSIS — C3432 Malignant neoplasm of lower lobe, left bronchus or lung: Secondary | ICD-10-CM | POA: Diagnosis not present

## 2022-08-24 DIAGNOSIS — Z510 Encounter for antineoplastic radiation therapy: Secondary | ICD-10-CM | POA: Diagnosis not present

## 2022-08-24 DIAGNOSIS — Z87891 Personal history of nicotine dependence: Secondary | ICD-10-CM | POA: Diagnosis not present

## 2022-08-24 DIAGNOSIS — Z5189 Encounter for other specified aftercare: Secondary | ICD-10-CM | POA: Diagnosis not present

## 2022-08-24 DIAGNOSIS — Z5111 Encounter for antineoplastic chemotherapy: Secondary | ICD-10-CM | POA: Diagnosis not present

## 2022-08-24 DIAGNOSIS — D649 Anemia, unspecified: Secondary | ICD-10-CM | POA: Diagnosis not present

## 2022-08-24 DIAGNOSIS — D6481 Anemia due to antineoplastic chemotherapy: Secondary | ICD-10-CM | POA: Diagnosis not present

## 2022-08-24 DIAGNOSIS — Z79899 Other long term (current) drug therapy: Secondary | ICD-10-CM | POA: Diagnosis not present

## 2022-08-24 DIAGNOSIS — C778 Secondary and unspecified malignant neoplasm of lymph nodes of multiple regions: Secondary | ICD-10-CM | POA: Diagnosis not present

## 2022-08-24 LAB — RAD ONC ARIA SESSION SUMMARY
Course Elapsed Days: 20
Plan Fractions Treated to Date: 14
Plan Prescribed Dose Per Fraction: 2 Gy
Plan Total Fractions Prescribed: 30
Plan Total Prescribed Dose: 60 Gy
Reference Point Dosage Given to Date: 28 Gy
Reference Point Session Dosage Given: 2 Gy
Session Number: 14

## 2022-08-24 MED FILL — Dexamethasone Sodium Phosphate Inj 100 MG/10ML: INTRAMUSCULAR | Qty: 1 | Status: AC

## 2022-08-24 MED FILL — Fosaprepitant Dimeglumine For IV Infusion 150 MG (Base Eq): INTRAVENOUS | Qty: 5 | Status: AC

## 2022-08-25 ENCOUNTER — Inpatient Hospital Stay (HOSPITAL_BASED_OUTPATIENT_CLINIC_OR_DEPARTMENT_OTHER): Payer: 59 | Admitting: Internal Medicine

## 2022-08-25 ENCOUNTER — Other Ambulatory Visit: Payer: Self-pay

## 2022-08-25 ENCOUNTER — Inpatient Hospital Stay: Payer: 59 | Admitting: Nutrition

## 2022-08-25 ENCOUNTER — Other Ambulatory Visit: Payer: 59

## 2022-08-25 ENCOUNTER — Inpatient Hospital Stay: Payer: 59

## 2022-08-25 ENCOUNTER — Ambulatory Visit
Admission: RE | Admit: 2022-08-25 | Discharge: 2022-08-25 | Disposition: A | Discharge status: Home / Self Care | Payer: 59 | Source: Ambulatory Visit | Attending: Radiation Oncology | Admitting: Radiation Oncology

## 2022-08-25 DIAGNOSIS — Z5111 Encounter for antineoplastic chemotherapy: Secondary | ICD-10-CM | POA: Diagnosis not present

## 2022-08-25 DIAGNOSIS — D649 Anemia, unspecified: Secondary | ICD-10-CM | POA: Diagnosis not present

## 2022-08-25 DIAGNOSIS — C778 Secondary and unspecified malignant neoplasm of lymph nodes of multiple regions: Secondary | ICD-10-CM | POA: Diagnosis not present

## 2022-08-25 DIAGNOSIS — C3432 Malignant neoplasm of lower lobe, left bronchus or lung: Secondary | ICD-10-CM

## 2022-08-25 DIAGNOSIS — Z95828 Presence of other vascular implants and grafts: Secondary | ICD-10-CM

## 2022-08-25 DIAGNOSIS — Z510 Encounter for antineoplastic radiation therapy: Secondary | ICD-10-CM | POA: Diagnosis not present

## 2022-08-25 DIAGNOSIS — Z5189 Encounter for other specified aftercare: Secondary | ICD-10-CM | POA: Diagnosis not present

## 2022-08-25 DIAGNOSIS — Z79899 Other long term (current) drug therapy: Secondary | ICD-10-CM | POA: Diagnosis not present

## 2022-08-25 DIAGNOSIS — Z87891 Personal history of nicotine dependence: Secondary | ICD-10-CM | POA: Diagnosis not present

## 2022-08-25 DIAGNOSIS — D6481 Anemia due to antineoplastic chemotherapy: Secondary | ICD-10-CM | POA: Diagnosis not present

## 2022-08-25 LAB — RAD ONC ARIA SESSION SUMMARY
Course Elapsed Days: 21
Plan Fractions Treated to Date: 15
Plan Prescribed Dose Per Fraction: 2 Gy
Plan Total Fractions Prescribed: 30
Plan Total Prescribed Dose: 60 Gy
Reference Point Dosage Given to Date: 30 Gy
Reference Point Session Dosage Given: 2 Gy
Session Number: 15

## 2022-08-25 LAB — CBC WITH DIFFERENTIAL (CANCER CENTER ONLY)
Abs Immature Granulocytes: 0.18 10*3/uL — ABNORMAL HIGH (ref 0.00–0.07)
Basophils Absolute: 0.1 10*3/uL (ref 0.0–0.1)
Basophils Relative: 1 %
Eosinophils Absolute: 0.1 10*3/uL (ref 0.0–0.5)
Eosinophils Relative: 0 %
HCT: 30 % — ABNORMAL LOW (ref 36.0–46.0)
Hemoglobin: 10 g/dL — ABNORMAL LOW (ref 12.0–15.0)
Immature Granulocytes: 1 %
Lymphocytes Relative: 4 %
Lymphs Abs: 0.7 10*3/uL (ref 0.7–4.0)
MCH: 31.4 pg (ref 26.0–34.0)
MCHC: 33.3 g/dL (ref 30.0–36.0)
MCV: 94.3 fL (ref 80.0–100.0)
Monocytes Absolute: 1.1 10*3/uL — ABNORMAL HIGH (ref 0.1–1.0)
Monocytes Relative: 7 %
Neutro Abs: 12.9 10*3/uL — ABNORMAL HIGH (ref 1.7–7.7)
Neutrophils Relative %: 87 %
Platelet Count: 228 10*3/uL (ref 150–400)
RBC: 3.18 MIL/uL — ABNORMAL LOW (ref 3.87–5.11)
RDW: 13.8 % (ref 11.5–15.5)
WBC Count: 15 10*3/uL — ABNORMAL HIGH (ref 4.0–10.5)
nRBC: 0 % (ref 0.0–0.2)

## 2022-08-25 LAB — CMP (CANCER CENTER ONLY)
ALT: 6 U/L (ref 0–44)
AST: 12 U/L — ABNORMAL LOW (ref 15–41)
Albumin: 3.7 g/dL (ref 3.5–5.0)
Alkaline Phosphatase: 105 U/L (ref 38–126)
Anion gap: 8 (ref 5–15)
BUN: 14 mg/dL (ref 8–23)
CO2: 30 mmol/L (ref 22–32)
Calcium: 9.3 mg/dL (ref 8.9–10.3)
Chloride: 102 mmol/L (ref 98–111)
Creatinine: 0.77 mg/dL (ref 0.44–1.00)
GFR, Estimated: >60 mL/min (ref >60)
Glucose, Bld: 105 mg/dL — ABNORMAL HIGH (ref 70–99)
Potassium: 3.5 mmol/L (ref 3.5–5.1)
Sodium: 140 mmol/L (ref 135–145)
Total Bilirubin: 0.4 mg/dL (ref 0.3–1.2)
Total Protein: 6.6 g/dL (ref 6.5–8.1)

## 2022-08-25 MED ORDER — SODIUM CHLORIDE 0.9% FLUSH
10.0000 mL | INTRAVENOUS | Status: DC | PRN
  Administered 2022-08-25: 10 mL

## 2022-08-25 MED ORDER — SODIUM CHLORIDE 0.9 % IV SOLN: Freq: Once | INTRAVENOUS | Status: AC

## 2022-08-25 MED ORDER — PALONOSETRON HCL INJECTION 0.25 MG/5ML
0.2500 mg | Freq: Once | INTRAVENOUS | Status: AC
  Administered 2022-08-25: 0.25 mg via INTRAVENOUS

## 2022-08-25 MED ORDER — SODIUM CHLORIDE 0.9% FLUSH
10.0000 mL | Freq: Once | INTRAVENOUS | Status: AC
  Administered 2022-08-25: 10 mL

## 2022-08-25 MED ORDER — SODIUM CHLORIDE 0.9 % IV SOLN
10.0000 mg | Freq: Once | INTRAVENOUS | Status: AC
  Administered 2022-08-25: 10 mg via INTRAVENOUS
  Filled 2022-08-25: qty 10

## 2022-08-25 MED ORDER — SODIUM CHLORIDE 0.9 % IV SOLN
304.5000 mg | Freq: Once | INTRAVENOUS | Status: AC
  Administered 2022-08-25: 300 mg via INTRAVENOUS
  Filled 2022-08-25: qty 30

## 2022-08-25 MED ORDER — HEPARIN SOD (PORK) LOCK FLUSH 100 UNIT/ML IV SOLN
500.0000 [IU] | Freq: Once | INTRAVENOUS | Status: AC | PRN
  Administered 2022-08-25: 500 [IU]

## 2022-08-25 MED ORDER — SODIUM CHLORIDE 0.9 % IV SOLN
150.0000 mg | Freq: Once | INTRAVENOUS | Status: AC
  Administered 2022-08-25: 150 mg via INTRAVENOUS
  Filled 2022-08-25: qty 150

## 2022-08-25 MED ORDER — SODIUM CHLORIDE 0.9 % IV SOLN
100.0000 mg/m2 | Freq: Once | INTRAVENOUS | Status: AC
  Administered 2022-08-25: 142 mg via INTRAVENOUS
  Filled 2022-08-25: qty 7.1

## 2022-08-25 MED FILL — Dexamethasone Sodium Phosphate Inj 100 MG/10ML: INTRAMUSCULAR | Qty: 1 | Status: AC

## 2022-08-25 NOTE — Patient Instructions (Signed)
[facility]  Discharge Instructions: Thank you for choosing [facility] to provide your oncology and hematology care.   If you have a lab appointment with the Cancer Center, please go directly to the Cancer Center and check in at the registration area.   Wear comfortable clothing and clothing appropriate for easy access to any Portacath or PICC line.   We strive to give you quality time with your provider. You may need to reschedule your appointment if you arrive late (15 or more minutes).  Arriving late affects you and other patients whose appointments are after yours.  Also, if you miss three or more appointments without notifying the office, you may be dismissed from the clinic at the provider's discretion.      For prescription refill requests, have your pharmacy contact our office and allow 72 hours for refills to be completed.    Today you received the following chemotherapy and/or immunotherapy agents: Carboplatin and Etoposide      To help prevent nausea and vomiting after your treatment, we encourage you to take your nausea medication as directed.  BELOW ARE SYMPTOMS THAT SHOULD BE REPORTED IMMEDIATELY: *FEVER GREATER THAN 100.4 F (38 C) OR HIGHER *CHILLS OR SWEATING *NAUSEA AND VOMITING THAT IS NOT CONTROLLED WITH YOUR NAUSEA MEDICATION *UNUSUAL SHORTNESS OF BREATH *UNUSUAL BRUISING OR BLEEDING *URINARY PROBLEMS (pain or burning when urinating, or frequent urination) *BOWEL PROBLEMS (unusual diarrhea, constipation, pain near the anus) TENDERNESS IN MOUTH AND THROAT WITH OR WITHOUT PRESENCE OF ULCERS (sore throat, sores in mouth, or a toothache) UNUSUAL RASH, SWELLING OR PAIN  UNUSUAL VAGINAL DISCHARGE OR ITCHING   Items with * indicate a potential emergency and should be followed up as soon as possible or go to the Emergency Department if any problems should occur.  Please show the CHEMOTHERAPY ALERT CARD or IMMUNOTHERAPY  ALERT CARD at check-in to the Emergency Department and triage nurse.  Should you have questions after your visit or need to cancel or reschedule your appointment, please contact [facility]  Dept: [phone]  and follow the prompts.  Office hours are 8:00 a.m. to 4:30 p.m. Monday - Friday. Please note that voicemails left after 4:00 p.m. may not be returned until the following business day.  We are closed weekends and major holidays. You have access to a nurse at all times for urgent questions. Please call the main number to the clinic Dept: [phone] and follow the prompts.   For any non-urgent questions, you may also contact your provider using MyChart. We now offer e-Visits for anyone 18 and older to request care online for non-urgent symptoms. For details visit [email or web address].   Also download the MyChart app! Go to the app store, search "MyChart", open the app, select [facility], and log in with your MyChart username and password.   

## 2022-08-25 NOTE — Progress Notes (Signed)
77 year old female diagnosed with limited stage small cell lung cancer and followed by Dr. Arbutus Ped and Dr. Mitzi Hansen.  She is receiving concurrent chemoradiation therapy. Her final radiation treatment scheduled for July 22.  Past medical history includes anxiety, hypertension, and scarlet fever.  Medications include Lipitor, calcium with vitamin D, Pepcid, Lasix, Compazine, Carafate.  Labs include glucose 127 on June 11.  This CMP for today is pending.  Height: 5 feet 0 inches. Weight: 99 pounds 3 ounces. Usual body weight: Patient weighed 112 pounds in January 2024.  Patient reports her usual body weight is 95 to 100 pounds. BMI: 19.37.  Patient reports her appetite is variable.  She notices decreased oral intake secondary to pain in her esophagus.  She has had increased fatigue.  She notes it is getting more difficult to swallow foods and liquids.  She wonders if it is okay for her to drink a protein shake.  Reports she tried Ensure and did not tolerate.  She voices concern about vitamin mineral interaction with Carafate.  She would like to discontinue multivitamins while on treatment.  Nutrition diagnosis: Unintended weight loss related to cancer and associated treatments as evidenced by 12% weight loss in 6 months.  Intervention: Educated to consume smaller more frequent meals and snacks with increased calories and protein to minimize further weight loss. Reviewed nutrition shakes.  Encourage patient to make her own milkshake as needed.  Encouraged her to use Carnation breakfast essential powder to add additional calories and protein. Provided options for vitamin/mineral consumption.  Encouraged her to reach out to MD who prescribed calcium with vitamin D and check with him before she discontinues completely. Provided nutrition fact sheets and contact information.  Monitoring, evaluation, goals: Patient will tolerate adequate calories and protein to minimize weight loss.  Next visit:  Tuesday, July 16 with Rosalita Chessman.  **Disclaimer: This note was dictated with voice recognition software. Similar sounding words can inadvertently be transcribed and this note may contain transcription errors which may not have been corrected upon publication of note.**

## 2022-08-25 NOTE — Progress Notes (Signed)
[facility] [phone]   [phone]  OFFICE PROGRESS NOTE  [name], MD [address] [place] [address]  DIAGNOSIS: limited stage (T2a, N2, M0) small cell lung cancer presented with left lower lobe lung mass in addition to left hilar and mediastinal lymphadenopathy diagnosed in April 2024.   PRIOR THERAPY: None  CURRENT THERAPY: Systemic therapy with carboplatin for AUC of 5 on day 1 and etoposide 100 Mg/M2 on days 1, 2 and 3 every 3 weeks concurrent with radiation.  She is status post 2 cycles.    INTERVAL HISTORY: Sheena [name] 77 y.o. female returns to the clinic today for follow-up visit accompanied by her husband.  The patient is feeling fine today with no concerning complaints except for mild fatigue.  She denied having any current chest pain, shortness of breath, cough or hemoptysis.  She has no nausea, vomiting, diarrhea or constipation.  She has no headache or visual changes.  She denied having any significant weight loss or night sweats.  She had repeat CT scan of the chest performed recently and she is here for evaluation and discussion of her scan results.  MEDICAL HISTORY: Past Medical History:  Diagnosis Date   Anxiety    Heart murmur    Hypertension    Scarlet fever with other complications childhood   "had to learn to work again" has had leg weakness    ALLERGIES:  is allergic to antihistamines, diphenhydramine-type; aspirin; and evista [raloxifene].  MEDICATIONS:  Current Outpatient Medications  Medication Sig Dispense Refill   acetaminophen (TYLENOL) 500 MG tablet Take 1,000 mg by mouth 2 (two) times daily as needed for mild pain. Reported on 06/04/2015     amLODipine (NORVASC) 5 MG tablet TAKE 1 TABLET (5 MG TOTAL) BY MOUTH DAILY. 90 tablet 3   aspirin EC 81 MG tablet Take 81 mg by mouth 2 (two) times a week.     atorvastatin (LIPITOR) 20 MG tablet TAKE 1 TABLET BY MOUTH EVERY DAY (Patient taking  differently: Take 20 mg by mouth at bedtime.) 90 tablet 3   Calcium Citrate-Vitamin D (CALCIUM CITRATE PETITE/VIT D PO) Take 1 tablet by mouth 2 (two) times daily.     cyclobenzaprine (FLEXERIL) 5 MG tablet TAKE 1 TABLET BY MOUTH TWICE A DAY AS NEEDED FOR MUSCLE SPASM (Patient not taking: Reported on 08/04/2022) 30 tablet 1   famotidine (PEPCID) 20 MG tablet Take 20 mg by mouth daily as needed for heartburn or indigestion.     furosemide (LASIX) 40 MG tablet TAKE 1 TABLET BY MOUTH EVERY DAY 90 tablet 1   lidocaine-prilocaine (EMLA) cream Apply 1 Application topically as needed. 30 g 2   metoprolol tartrate (LOPRESSOR) 50 MG tablet TAKE 1 TABLET BY MOUTH TWICE A DAY 180 tablet 1   prochlorperazine (COMPAZINE) 10 MG tablet Take 1 tablet (10 mg total) by mouth every 6 (six) hours as needed for nausea or vomiting. 30 tablet 2   sucralfate (CARAFATE) 1 g tablet Take 1 tablet (1 g total) by mouth 4 (four) times daily. Dissolve each tablet in 15 cc water before use. 120 tablet 2   traMADol (ULTRAM) 50 MG tablet Take 0.5-1 tablets (25-50 mg total) by mouth 2 (two) times daily as needed. For chronic low back pain. May refill monthly 60 tablet 2   No current facility-administered medications for this visit.    SURGICAL HISTORY:  Past Surgical History:  Procedure Laterality Date   BRONCHIAL  NEEDLE ASPIRATION BIOPSY  06/30/2022   Procedure: BRONCHIAL NEEDLE ASPIRATION BIOPSIES;  Surgeon: [name], DO;  Location: [facility];  Service: Pulmonary;;   IR IMAGING GUIDED PORT INSERTION  07/31/2022   KYPHOPLASTY     2014   peridontal surgery     VIDEO BRONCHOSCOPY WITH ENDOBRONCHIAL ULTRASOUND N/A 06/30/2022   Procedure: VIDEO BRONCHOSCOPY WITH ENDOBRONCHIAL ULTRASOUND;  Surgeon: [name], DO;  Location: [facility];  Service: Pulmonary;  Laterality: N/A;    REVIEW OF SYSTEMS:  Constitutional: positive for fatigue Eyes: negative Ears, nose, mouth, throat, and face: negative Respiratory:  negative Cardiovascular: negative Gastrointestinal: negative Genitourinary:negative Integument/breast: negative Hematologic/lymphatic: negative Musculoskeletal:negative Neurological: negative Behavioral/Psych: negative Endocrine: negative Allergic/Immunologic: negative   PHYSICAL EXAMINATION: General appearance: alert, cooperative, fatigued, and no distress Head: Normocephalic, without obvious abnormality, atraumatic Neck: no adenopathy, no JVD, supple, symmetrical, trachea midline, and thyroid not enlarged, symmetric, no tenderness/mass/nodules Lymph nodes: Cervical, supraclavicular, and axillary nodes normal. Resp: clear to auscultation bilaterally Back: symmetric, no curvature. ROM normal. No CVA tenderness. Cardio: regular rate and rhythm, S1, S2 normal, no murmur, click, rub or gallop GI: soft, non-tender; bowel sounds normal; no masses,  no organomegaly Extremities: extremities normal, atraumatic, no cyanosis or edema Neurologic: Alert and oriented X 3, normal strength and tone. Normal symmetric reflexes. Normal coordination and gait  ECOG PERFORMANCE STATUS: 1 - Symptomatic but completely ambulatory  There were no vitals taken for this visit.  LABORATORY DATA: Lab Results  Component Value Date   WBC 25.5 (H) 08/20/2022   HGB 7.7 (L) 08/20/2022   HCT 23.2 (L) 08/20/2022   MCV 94.7 08/20/2022   PLT 77 (L) 08/20/2022      Chemistry      Component Value Date/Time   NA 138 08/18/2022 1211   K 3.8 08/18/2022 1211   CL 98 08/18/2022 1211   CO2 31 08/18/2022 1211   BUN 17 08/18/2022 1211   CREATININE 1.08 (H) 08/18/2022 1211   CREATININE 1.18 (H) 02/19/2020 1048      Component Value Date/Time   CALCIUM 9.4 08/18/2022 1211   ALKPHOS 113 08/18/2022 1211   AST 12 (L) 08/18/2022 1211   ALT 8 08/18/2022 1211   BILITOT 0.3 08/18/2022 1211       RADIOGRAPHIC STUDIES: CT Chest W Contrast  Result Date: 08/25/2022 CLINICAL DATA:  Small cell lung cancer. Assess  treatment response. * Tracking Code: BO * EXAM: CT CHEST WITH CONTRAST TECHNIQUE: Multidetector CT imaging of the chest was performed during intravenous contrast administration. RADIATION DOSE REDUCTION: This exam was performed according to the departmental dose-optimization program which includes automated exposure control, adjustment of the mA and/or kV according to patient size and/or use of iterative reconstruction technique. CONTRAST:  75mL OMNIPAQUE IOHEXOL 300 MG/ML  SOLN COMPARISON:  06/12/2022 FINDINGS: Cardiovascular: The heart is normal in size. No pericardial effusion. Stable atherosclerotic calcifications involving the thoracic aorta and branch vessels. No aneurysm or dissection. Mediastinum/Nodes: Small scattered sub 7 mm mediastinal and hilar lymph nodes. No mass or overt adenopathy. The esophagus is unremarkable. Lungs/Pleura: Stable underlying advanced emphysematous changes with areas of pulmonary scarring. The 3 cm lobulated left lower lobe lung mass is no longer identified suggesting an excellent response to treatment. No new pulmonary nodules or pulmonary lesions. No pleural effusion. Upper Abdomen: No significant upper abdominal findings. No hepatic or adrenal gland lesions. No upper abdominal adenopathy. Stable vascular disease. Musculoskeletal: No significant bony findings. Stable thoracic compression fractures and vertebral augmentation changes. IMPRESSION: 1. The 3  cm lobulated left lower lobe lung mass is no longer identified suggesting an excellent response to treatment. 2. No new pulmonary nodules or pulmonary lesions. 3. No mediastinal or hilar mass or adenopathy. Aortic Atherosclerosis (ICD10-I70.0) and Emphysema (ICD10-J43.9). Electronically Signed   By: Rudie Meyer M.D.   On: 08/25/2022 08:52   IR IMAGING GUIDED PORT INSERTION  Result Date: 07/31/2022 INDICATION: 77 year old female referred for port catheter EXAM: IMAGE GUIDED PORT CATHETER MEDICATIONS: None ANESTHESIA/SEDATION:  Moderate (conscious) sedation was employed during this procedure. A total of Versed 2.0 mg and Fentanyl 100 mcg was administered intravenously. Moderate Sedation Time: 17 minutes. The patient's level of consciousness and vital signs were monitored continuously by radiology nursing throughout the procedure under my direct supervision. FLUOROSCOPY TIME:  Fluoroscopy Time 0 mGy). COMPLICATIONS: None PROCEDURE: Informed written consent was obtained from the patient after a thorough discussion of the procedural risks, benefits and alternatives. All questions were addressed. Maximal Sterile Barrier Technique was utilized including caps, mask, sterile gowns, sterile gloves, sterile drape, hand hygiene and skin antiseptic. A timeout was performed prior to the initiation of the procedure. Ultrasound survey was performed with images stored and sent to PACs. Right IJ vein documented to be patent. The right neck and chest was prepped with chlorhexidine, and draped in the usual sterile fashion using maximum barrier technique (cap and mask, sterile gown, sterile gloves, large sterile sheet, hand hygiene and cutaneous antiseptic). Local anesthesia was attained by infiltration with 1% lidocaine without epinephrine. Ultrasound demonstrated patency of the right internal jugular vein, and this was documented with an image. Under real-time ultrasound guidance, this vein was accessed with a 21 gauge micropuncture needle and image documentation was performed. A small dermatotomy was made at the access site with an 11 scalpel. A 0.018" wire was advanced into the SVC and used to estimate the length of the internal catheter. The access needle exchanged for a 33F micropuncture vascular sheath. The 0.018" wire was then removed and a 0.035" wire advanced into the IVC. An appropriate location for the subcutaneous reservoir was selected below the clavicle and an incision was made through the skin and underlying soft tissues. The subcutaneous  tissues were then dissected using a combination of blunt and sharp surgical technique and a pocket was formed. A single lumen power injectable portacatheter was then tunneled through the subcutaneous tissues from the pocket to the dermatotomy and the port reservoir placed within the subcutaneous pocket. The venous access site was then serially dilated and a peel away vascular sheath placed over the wire. The wire was removed and the port catheter advanced into position under fluoroscopic guidance. The catheter tip is positioned in the cavoatrial junction. This was documented with a spot image. The portacatheter was then tested and found to flush and aspirate well. The port was flushed with saline followed by 100 units/mL heparinized saline. The pocket was then closed in two layers using first subdermal inverted interrupted absorbable sutures followed by a running subcuticular suture. The epidermis was then sealed with Dermabond. The dermatotomy at the venous access site was also seal with Dermabond. Patient tolerated the procedure well and remained hemodynamically stable throughout. No complications encountered and no significant blood loss encountered IMPRESSION: Status post right IJ port catheter placement. Signed, Yvone Neu. Miachel Roux, RPVI [facility] Radiology Electronically Signed   By: Gilmer Mor D.O.   On: 07/31/2022 16:25    ASSESSMENT AND PLAN: This is a very pleasant 77 years old white  female with limited stage (T2a, N2, M0) small cell lung cancer presented with left lower lobe lung mass in addition to left hilar and mediastinal lymphadenopathy diagnosed in April 2024.  The patient is currently undergoing a course of systemic chemotherapy with carboplatin for AUC of 5 on day 1 and 2 etoposide 100 Mg/M2 on days 1, 2 and 3 concurrent with radiation.  She is status post 2 cycles. She has been tolerating the treatment well except for fatigue from  the chemotherapy-induced anemia and she required 2 units of PRBCs transfusion recently. She had repeat CT scan of the chest performed on August 20, 2022 and that showed almost resolution of the left lower lobe lung mass as well as the mediastinal/hilar lymphadenopathy. I recommended for the patient to continue her current treatment and she will proceed with cycle #3 today. She will come back for follow-up visit in 3 weeks for evaluation before the last cycle of her treatment. The patient was advised to call immediately if she has any other concerning symptoms in the interval. The patient voices understanding of current disease status and treatment options and is in agreement with the current care plan. All questions were answered. The patient knows to call the clinic with any problems, questions or concerns. We can certainly see the patient much sooner if necessary.  The total time spent in the appointment was 30 minutes.  Disclaimer: This note was dictated with voice recognition software. Similar sounding words can inadvertently be transcribed and may not be corrected upon review.

## 2022-08-25 NOTE — Progress Notes (Signed)
Alt: 6, ast: 12, Scr: .77, total billi: 0.4

## 2022-08-25 NOTE — Progress Notes (Signed)
NN met with pt and her husband at her f/u with Dr Arbutus Ped to review her recent CT scan and start C3. Pts CT shows she has had excellent response to treatment.  NN will follow up with pt after she has completed her treatment. NN encouraged pt to reach out to NN for any questions and concerns.

## 2022-08-26 ENCOUNTER — Ambulatory Visit
Admission: RE | Admit: 2022-08-26 | Discharge: 2022-08-26 | Disposition: A | Discharge status: Home / Self Care | Payer: 59 | Source: Ambulatory Visit | Attending: Radiation Oncology | Admitting: Radiation Oncology

## 2022-08-26 ENCOUNTER — Other Ambulatory Visit: Payer: Self-pay

## 2022-08-26 ENCOUNTER — Other Ambulatory Visit: Payer: Self-pay | Admitting: Radiation Oncology

## 2022-08-26 ENCOUNTER — Other Ambulatory Visit: Payer: Self-pay | Admitting: Family Medicine

## 2022-08-26 ENCOUNTER — Inpatient Hospital Stay: Payer: 59

## 2022-08-26 VITALS — BP 106/50 | HR 73 | Temp 97.8°F | Resp 17

## 2022-08-26 DIAGNOSIS — C778 Secondary and unspecified malignant neoplasm of lymph nodes of multiple regions: Secondary | ICD-10-CM | POA: Diagnosis not present

## 2022-08-26 DIAGNOSIS — C3432 Malignant neoplasm of lower lobe, left bronchus or lung: Secondary | ICD-10-CM

## 2022-08-26 DIAGNOSIS — Z510 Encounter for antineoplastic radiation therapy: Secondary | ICD-10-CM | POA: Diagnosis not present

## 2022-08-26 DIAGNOSIS — Z5111 Encounter for antineoplastic chemotherapy: Secondary | ICD-10-CM | POA: Diagnosis not present

## 2022-08-26 DIAGNOSIS — Z79899 Other long term (current) drug therapy: Secondary | ICD-10-CM | POA: Diagnosis not present

## 2022-08-26 DIAGNOSIS — D6481 Anemia due to antineoplastic chemotherapy: Secondary | ICD-10-CM | POA: Diagnosis not present

## 2022-08-26 DIAGNOSIS — D649 Anemia, unspecified: Secondary | ICD-10-CM | POA: Diagnosis not present

## 2022-08-26 DIAGNOSIS — Z5189 Encounter for other specified aftercare: Secondary | ICD-10-CM | POA: Diagnosis not present

## 2022-08-26 DIAGNOSIS — Z87891 Personal history of nicotine dependence: Secondary | ICD-10-CM | POA: Diagnosis not present

## 2022-08-26 LAB — RAD ONC ARIA SESSION SUMMARY
Course Elapsed Days: 22
Plan Fractions Treated to Date: 16
Plan Prescribed Dose Per Fraction: 2 Gy
Plan Total Fractions Prescribed: 30
Plan Total Prescribed Dose: 60 Gy
Reference Point Dosage Given to Date: 32 Gy
Reference Point Session Dosage Given: 2 Gy
Session Number: 16

## 2022-08-26 MED ORDER — SODIUM CHLORIDE 0.9 % IV SOLN: Freq: Once | INTRAVENOUS | Status: AC

## 2022-08-26 MED ORDER — SODIUM CHLORIDE 0.9 % IV SOLN
10.0000 mg | Freq: Once | INTRAVENOUS | Status: AC
  Administered 2022-08-26: 10 mg via INTRAVENOUS
  Filled 2022-08-26: qty 10

## 2022-08-26 MED ORDER — SODIUM CHLORIDE 0.9 % IV SOLN
100.0000 mg/m2 | Freq: Once | INTRAVENOUS | Status: AC
  Administered 2022-08-26: 142 mg via INTRAVENOUS
  Filled 2022-08-26: qty 7.1

## 2022-08-26 MED ORDER — SODIUM CHLORIDE 0.9% FLUSH
10.0000 mL | INTRAVENOUS | Status: DC | PRN
  Administered 2022-08-26: 10 mL

## 2022-08-26 MED ORDER — SUCRALFATE 1 GM/10ML PO SUSP: 1.0000 g | Freq: Three times a day (TID) | ORAL | 1 refills | Status: DC

## 2022-08-26 MED ORDER — HEPARIN SOD (PORK) LOCK FLUSH 100 UNIT/ML IV SOLN
500.0000 [IU] | Freq: Once | INTRAVENOUS | Status: AC | PRN
  Administered 2022-08-26: 500 [IU]

## 2022-08-26 MED FILL — Dexamethasone Sodium Phosphate Inj 100 MG/10ML: INTRAMUSCULAR | Qty: 1 | Status: AC

## 2022-08-27 ENCOUNTER — Ambulatory Visit
Admission: RE | Admit: 2022-08-27 | Discharge: 2022-08-27 | Disposition: A | Discharge status: Home / Self Care | Payer: 59 | Source: Ambulatory Visit | Attending: Radiation Oncology | Admitting: Radiation Oncology

## 2022-08-27 ENCOUNTER — Inpatient Hospital Stay: Payer: 59

## 2022-08-27 ENCOUNTER — Other Ambulatory Visit: Payer: Self-pay

## 2022-08-27 VITALS — BP 111/52 | HR 71 | Temp 97.1°F | Resp 18 | Wt 104.5 lb

## 2022-08-27 DIAGNOSIS — D6481 Anemia due to antineoplastic chemotherapy: Secondary | ICD-10-CM | POA: Diagnosis not present

## 2022-08-27 DIAGNOSIS — Z5189 Encounter for other specified aftercare: Secondary | ICD-10-CM | POA: Diagnosis not present

## 2022-08-27 DIAGNOSIS — C3432 Malignant neoplasm of lower lobe, left bronchus or lung: Secondary | ICD-10-CM

## 2022-08-27 DIAGNOSIS — Z5111 Encounter for antineoplastic chemotherapy: Secondary | ICD-10-CM | POA: Diagnosis not present

## 2022-08-27 DIAGNOSIS — C778 Secondary and unspecified malignant neoplasm of lymph nodes of multiple regions: Secondary | ICD-10-CM | POA: Diagnosis not present

## 2022-08-27 DIAGNOSIS — Z87891 Personal history of nicotine dependence: Secondary | ICD-10-CM | POA: Diagnosis not present

## 2022-08-27 DIAGNOSIS — Z510 Encounter for antineoplastic radiation therapy: Secondary | ICD-10-CM | POA: Diagnosis not present

## 2022-08-27 DIAGNOSIS — D649 Anemia, unspecified: Secondary | ICD-10-CM | POA: Diagnosis not present

## 2022-08-27 DIAGNOSIS — Z79899 Other long term (current) drug therapy: Secondary | ICD-10-CM | POA: Diagnosis not present

## 2022-08-27 LAB — RAD ONC ARIA SESSION SUMMARY
Course Elapsed Days: 23
Plan Fractions Treated to Date: 17
Plan Prescribed Dose Per Fraction: 2 Gy
Plan Total Fractions Prescribed: 30
Plan Total Prescribed Dose: 60 Gy
Reference Point Dosage Given to Date: 34 Gy
Reference Point Session Dosage Given: 2 Gy
Session Number: 17

## 2022-08-27 MED ORDER — HEPARIN SOD (PORK) LOCK FLUSH 100 UNIT/ML IV SOLN
500.0000 [IU] | Freq: Once | INTRAVENOUS | Status: AC | PRN
  Administered 2022-08-27: 500 [IU]

## 2022-08-27 MED ORDER — SODIUM CHLORIDE 0.9 % IV SOLN
10.0000 mg | Freq: Once | INTRAVENOUS | Status: AC
  Administered 2022-08-27: 10 mg via INTRAVENOUS
  Filled 2022-08-27: qty 10

## 2022-08-27 MED ORDER — SODIUM CHLORIDE 0.9 % IV SOLN
100.0000 mg/m2 | Freq: Once | INTRAVENOUS | Status: AC
  Administered 2022-08-27: 142 mg via INTRAVENOUS
  Filled 2022-08-27: qty 7.1

## 2022-08-27 MED ORDER — SODIUM CHLORIDE 0.9 % IV SOLN: Freq: Once | INTRAVENOUS | Status: AC

## 2022-08-27 MED ORDER — SODIUM CHLORIDE 0.9% FLUSH
10.0000 mL | INTRAVENOUS | Status: DC | PRN
  Administered 2022-08-27: 10 mL

## 2022-08-27 NOTE — Patient Instructions (Signed)
[facility]  Discharge Instructions: Thank you for choosing [facility] to provide your oncology and hematology care.   If you have a lab appointment with the Cancer Center, please go directly to the Cancer Center and check in at the registration area.   Wear comfortable clothing and clothing appropriate for easy access to any Portacath or PICC line.   We strive to give you quality time with your provider. You may need to reschedule your appointment if you arrive late (15 or more minutes).  Arriving late affects you and other patients whose appointments are after yours.  Also, if you miss three or more appointments without notifying the office, you may be dismissed from the clinic at the provider's discretion.      For prescription refill requests, have your pharmacy contact our office and allow 72 hours for refills to be completed.    Today you received the following chemotherapy and/or immunotherapy agents; Etoposide      To help prevent nausea and vomiting after your treatment, we encourage you to take your nausea medication as directed.  BELOW ARE SYMPTOMS THAT SHOULD BE REPORTED IMMEDIATELY: *FEVER GREATER THAN 100.4 F (38 C) OR HIGHER *CHILLS OR SWEATING *NAUSEA AND VOMITING THAT IS NOT CONTROLLED WITH YOUR NAUSEA MEDICATION *UNUSUAL SHORTNESS OF BREATH *UNUSUAL BRUISING OR BLEEDING *URINARY PROBLEMS (pain or burning when urinating, or frequent urination) *BOWEL PROBLEMS (unusual diarrhea, constipation, pain near the anus) TENDERNESS IN MOUTH AND THROAT WITH OR WITHOUT PRESENCE OF ULCERS (sore throat, sores in mouth, or a toothache) UNUSUAL RASH, SWELLING OR PAIN  UNUSUAL VAGINAL DISCHARGE OR ITCHING   Items with * indicate a potential emergency and should be followed up as soon as possible or go to the Emergency Department if any problems should occur.  Please show the CHEMOTHERAPY ALERT CARD or IMMUNOTHERAPY ALERT CARD at  check-in to the Emergency Department and triage nurse.  Should you have questions after your visit or need to cancel or reschedule your appointment, please contact [facility]  Dept: [phone]  and follow the prompts.  Office hours are 8:00 a.m. to 4:30 p.m. Monday - Friday. Please note that voicemails left after 4:00 p.m. may not be returned until the following business day.  We are closed weekends and major holidays. You have access to a nurse at all times for urgent questions. Please call the main number to the clinic Dept: [phone] and follow the prompts.   For any non-urgent questions, you may also contact your provider using MyChart. We now offer e-Visits for anyone 18 and older to request care online for non-urgent symptoms. For details visit [email or web address].   Also download the MyChart app! Go to the app store, search "MyChart", open the app, select [place] [facility], and log in with your MyChart username and password.   

## 2022-08-28 ENCOUNTER — Other Ambulatory Visit: Payer: Self-pay

## 2022-08-28 ENCOUNTER — Ambulatory Visit
Admission: RE | Admit: 2022-08-28 | Discharge: 2022-08-28 | Disposition: A | Discharge status: Home / Self Care | Payer: 59 | Source: Ambulatory Visit | Attending: Radiation Oncology | Admitting: Radiation Oncology

## 2022-08-28 ENCOUNTER — Inpatient Hospital Stay: Payer: 59

## 2022-08-28 DIAGNOSIS — Z5189 Encounter for other specified aftercare: Secondary | ICD-10-CM | POA: Diagnosis not present

## 2022-08-28 DIAGNOSIS — Z79899 Other long term (current) drug therapy: Secondary | ICD-10-CM | POA: Diagnosis not present

## 2022-08-28 DIAGNOSIS — Z87891 Personal history of nicotine dependence: Secondary | ICD-10-CM | POA: Diagnosis not present

## 2022-08-28 DIAGNOSIS — Z510 Encounter for antineoplastic radiation therapy: Secondary | ICD-10-CM | POA: Diagnosis not present

## 2022-08-28 DIAGNOSIS — C3432 Malignant neoplasm of lower lobe, left bronchus or lung: Secondary | ICD-10-CM | POA: Diagnosis not present

## 2022-08-28 DIAGNOSIS — Z5111 Encounter for antineoplastic chemotherapy: Secondary | ICD-10-CM | POA: Diagnosis not present

## 2022-08-28 DIAGNOSIS — D649 Anemia, unspecified: Secondary | ICD-10-CM | POA: Diagnosis not present

## 2022-08-28 DIAGNOSIS — C778 Secondary and unspecified malignant neoplasm of lymph nodes of multiple regions: Secondary | ICD-10-CM | POA: Diagnosis not present

## 2022-08-28 DIAGNOSIS — D6481 Anemia due to antineoplastic chemotherapy: Secondary | ICD-10-CM | POA: Diagnosis not present

## 2022-08-28 LAB — RAD ONC ARIA SESSION SUMMARY
Course Elapsed Days: 24
Plan Fractions Treated to Date: 18
Plan Prescribed Dose Per Fraction: 2 Gy
Plan Total Fractions Prescribed: 30
Plan Total Prescribed Dose: 60 Gy
Reference Point Dosage Given to Date: 36 Gy
Reference Point Session Dosage Given: 2 Gy
Session Number: 18

## 2022-08-28 MED ORDER — SONAFINE EX EMUL
1.0000 | Freq: Two times a day (BID) | CUTANEOUS | Status: DC
  Administered 2022-08-28: 1 via TOPICAL

## 2022-08-28 NOTE — Telephone Encounter (Signed)
[facility] called and stated there was a lot missing on the RX that was sent. It needs to have NPI, diagnosis and code. Progress notes stating she has a full time caregiver. Also, why she needs a transport chair instead of a regular wheelchair. Please advise.

## 2022-08-28 NOTE — Telephone Encounter (Signed)
Unfortunately will need to be done in a visit as sounds like they need it documented in note - they could ask oncology of they could complete

## 2022-08-28 NOTE — Telephone Encounter (Signed)
See below, last ov was in March. 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 Regular wheelchair) or is an ov needed for this?

## 2022-08-28 NOTE — Telephone Encounter (Signed)
Patient's spouse called back stating he spoke with Chloe at [facility] and she informed him they hadn't gotten anything. States they gave him number to re-fax to [phone].

## 2022-08-28 NOTE — Telephone Encounter (Signed)
Rx refaxed to number below. 

## 2022-08-28 NOTE — Telephone Encounter (Signed)
Patient's spouse called for an update on transport chair order. Informed pt to call [facility] and inform them of fax date, 6/17. Pt verbalized understanding and stated will callback if re-fax needed.

## 2022-08-28 NOTE — Telephone Encounter (Signed)
Rx refaxed to [facility] [phone].

## 2022-08-28 NOTE — Telephone Encounter (Signed)
Also, it needs to be as a new RX, it cannot be added to the one that was faxed.

## 2022-08-29 ENCOUNTER — Inpatient Hospital Stay: Payer: 59

## 2022-08-29 VITALS — BP 108/57 | HR 74 | Temp 97.3°F | Resp 18

## 2022-08-29 DIAGNOSIS — Z87891 Personal history of nicotine dependence: Secondary | ICD-10-CM | POA: Diagnosis not present

## 2022-08-29 DIAGNOSIS — C3432 Malignant neoplasm of lower lobe, left bronchus or lung: Secondary | ICD-10-CM

## 2022-08-29 DIAGNOSIS — C778 Secondary and unspecified malignant neoplasm of lymph nodes of multiple regions: Secondary | ICD-10-CM | POA: Diagnosis not present

## 2022-08-29 DIAGNOSIS — D649 Anemia, unspecified: Secondary | ICD-10-CM | POA: Diagnosis not present

## 2022-08-29 DIAGNOSIS — Z5189 Encounter for other specified aftercare: Secondary | ICD-10-CM | POA: Diagnosis not present

## 2022-08-29 DIAGNOSIS — Z5111 Encounter for antineoplastic chemotherapy: Secondary | ICD-10-CM | POA: Diagnosis not present

## 2022-08-29 DIAGNOSIS — Z510 Encounter for antineoplastic radiation therapy: Secondary | ICD-10-CM | POA: Diagnosis not present

## 2022-08-29 DIAGNOSIS — Z79899 Other long term (current) drug therapy: Secondary | ICD-10-CM | POA: Diagnosis not present

## 2022-08-29 DIAGNOSIS — D6481 Anemia due to antineoplastic chemotherapy: Secondary | ICD-10-CM | POA: Diagnosis not present

## 2022-08-29 MED ORDER — PEGFILGRASTIM-CBQV 6 MG/0.6ML ~~LOC~~ SOSY
6.0000 mg | PREFILLED_SYRINGE | Freq: Once | SUBCUTANEOUS | Status: AC
  Administered 2022-08-29: 6 mg via SUBCUTANEOUS
  Filled 2022-08-29: qty 0.6

## 2022-08-29 NOTE — Patient Instructions (Signed)

## 2022-08-31 ENCOUNTER — Other Ambulatory Visit: Payer: Self-pay

## 2022-08-31 ENCOUNTER — Telehealth: Payer: Self-pay | Admitting: Internal Medicine

## 2022-08-31 ENCOUNTER — Ambulatory Visit
Admission: RE | Admit: 2022-08-31 | Discharge: 2022-08-31 | Disposition: A | Discharge status: Home / Self Care | Payer: 59 | Source: Ambulatory Visit | Attending: Radiation Oncology | Admitting: Radiation Oncology

## 2022-08-31 DIAGNOSIS — C3432 Malignant neoplasm of lower lobe, left bronchus or lung: Secondary | ICD-10-CM | POA: Insufficient documentation

## 2022-08-31 DIAGNOSIS — Z5111 Encounter for antineoplastic chemotherapy: Secondary | ICD-10-CM | POA: Insufficient documentation

## 2022-08-31 DIAGNOSIS — Z79899 Other long term (current) drug therapy: Secondary | ICD-10-CM | POA: Insufficient documentation

## 2022-08-31 DIAGNOSIS — Z5189 Encounter for other specified aftercare: Secondary | ICD-10-CM | POA: Diagnosis not present

## 2022-08-31 DIAGNOSIS — Z87891 Personal history of nicotine dependence: Secondary | ICD-10-CM | POA: Insufficient documentation

## 2022-08-31 DIAGNOSIS — Z510 Encounter for antineoplastic radiation therapy: Secondary | ICD-10-CM | POA: Diagnosis not present

## 2022-08-31 LAB — RAD ONC ARIA SESSION SUMMARY
Course Elapsed Days: 27
Plan Fractions Treated to Date: 19
Plan Prescribed Dose Per Fraction: 2 Gy
Plan Total Fractions Prescribed: 30
Plan Total Prescribed Dose: 60 Gy
Reference Point Dosage Given to Date: 38 Gy
Reference Point Session Dosage Given: 2 Gy
Session Number: 19

## 2022-08-31 NOTE — Telephone Encounter (Signed)
Called both numbers on file but no answer. LVM informing pt of PCP message below.

## 2022-08-31 NOTE — Telephone Encounter (Signed)
Called patient regarding July appointments, patient is notified.  

## 2022-08-31 NOTE — Telephone Encounter (Signed)
Please schedule ov to complete request for electric wheelchair/transport chair OR they can ask oncology at that visit today to complete.

## 2022-09-01 ENCOUNTER — Other Ambulatory Visit: Payer: Self-pay

## 2022-09-01 ENCOUNTER — Inpatient Hospital Stay: Payer: 59 | Attending: Internal Medicine

## 2022-09-01 ENCOUNTER — Ambulatory Visit
Admission: RE | Admit: 2022-09-01 | Discharge: 2022-09-01 | Disposition: A | Discharge status: Home / Self Care | Payer: 59 | Source: Ambulatory Visit | Attending: Radiation Oncology | Admitting: Radiation Oncology

## 2022-09-01 ENCOUNTER — Other Ambulatory Visit: Payer: Self-pay | Admitting: Physician Assistant

## 2022-09-01 VITALS — BP 126/69 | HR 90 | Temp 98.2°F | Resp 18

## 2022-09-01 DIAGNOSIS — Z87891 Personal history of nicotine dependence: Secondary | ICD-10-CM | POA: Diagnosis not present

## 2022-09-01 DIAGNOSIS — Z95828 Presence of other vascular implants and grafts: Secondary | ICD-10-CM

## 2022-09-01 DIAGNOSIS — Z5111 Encounter for antineoplastic chemotherapy: Secondary | ICD-10-CM | POA: Diagnosis not present

## 2022-09-01 DIAGNOSIS — Z5189 Encounter for other specified aftercare: Secondary | ICD-10-CM | POA: Diagnosis not present

## 2022-09-01 DIAGNOSIS — Z79899 Other long term (current) drug therapy: Secondary | ICD-10-CM | POA: Diagnosis not present

## 2022-09-01 DIAGNOSIS — C3432 Malignant neoplasm of lower lobe, left bronchus or lung: Secondary | ICD-10-CM | POA: Diagnosis not present

## 2022-09-01 DIAGNOSIS — Z510 Encounter for antineoplastic radiation therapy: Secondary | ICD-10-CM | POA: Diagnosis not present

## 2022-09-01 LAB — CBC WITH DIFFERENTIAL (CANCER CENTER ONLY)
Abs Immature Granulocytes: 0.26 10*3/uL — ABNORMAL HIGH (ref 0.00–0.07)
Basophils Absolute: 0 10*3/uL (ref 0.0–0.1)
Basophils Relative: 1 %
Eosinophils Absolute: 0 10*3/uL (ref 0.0–0.5)
Eosinophils Relative: 0 %
HCT: 27.7 % — ABNORMAL LOW (ref 36.0–46.0)
Hemoglobin: 9.1 g/dL — ABNORMAL LOW (ref 12.0–15.0)
Immature Granulocytes: 6 %
Lymphocytes Relative: 5 %
Lymphs Abs: 0.2 10*3/uL — ABNORMAL LOW (ref 0.7–4.0)
MCH: 31.3 pg (ref 26.0–34.0)
MCHC: 32.9 g/dL (ref 30.0–36.0)
MCV: 95.2 fL (ref 80.0–100.0)
Monocytes Absolute: 0 10*3/uL — ABNORMAL LOW (ref 0.1–1.0)
Monocytes Relative: 0 %
Neutro Abs: 4 10*3/uL (ref 1.7–7.7)
Neutrophils Relative %: 88 %
Platelet Count: 86 10*3/uL — ABNORMAL LOW (ref 150–400)
RBC: 2.91 MIL/uL — ABNORMAL LOW (ref 3.87–5.11)
RDW: 14.6 % (ref 11.5–15.5)
WBC Count: 4.5 10*3/uL (ref 4.0–10.5)
nRBC: 0 % (ref 0.0–0.2)

## 2022-09-01 LAB — CMP (CANCER CENTER ONLY)
ALT: 9 U/L (ref 0–44)
AST: 12 U/L — ABNORMAL LOW (ref 15–41)
Albumin: 3.8 g/dL (ref 3.5–5.0)
Alkaline Phosphatase: 80 U/L (ref 38–126)
Anion gap: 8 (ref 5–15)
BUN: 20 mg/dL (ref 8–23)
CO2: 36 mmol/L — ABNORMAL HIGH (ref 22–32)
Calcium: 9.8 mg/dL (ref 8.9–10.3)
Chloride: 95 mmol/L — ABNORMAL LOW (ref 98–111)
Creatinine: 0.8 mg/dL (ref 0.44–1.00)
GFR, Estimated: >60 mL/min (ref >60)
Glucose, Bld: 101 mg/dL — ABNORMAL HIGH (ref 70–99)
Potassium: 3.5 mmol/L (ref 3.5–5.1)
Sodium: 139 mmol/L (ref 135–145)
Total Bilirubin: 1 mg/dL (ref 0.3–1.2)
Total Protein: 6.1 g/dL — ABNORMAL LOW (ref 6.5–8.1)

## 2022-09-01 LAB — RAD ONC ARIA SESSION SUMMARY
Course Elapsed Days: 28
Plan Fractions Treated to Date: 20
Plan Prescribed Dose Per Fraction: 2 Gy
Plan Total Fractions Prescribed: 30
Plan Total Prescribed Dose: 60 Gy
Reference Point Dosage Given to Date: 40 Gy
Reference Point Session Dosage Given: 2 Gy
Session Number: 20

## 2022-09-01 MED ORDER — HEPARIN SOD (PORK) LOCK FLUSH 100 UNIT/ML IV SOLN
500.0000 [IU] | Freq: Once | INTRAVENOUS | Status: AC
  Administered 2022-09-01: 500 [IU]

## 2022-09-01 MED ORDER — SODIUM CHLORIDE 0.9% FLUSH
10.0000 mL | Freq: Once | INTRAVENOUS | Status: AC
  Administered 2022-09-01: 10 mL

## 2022-09-02 ENCOUNTER — Ambulatory Visit
Admission: RE | Admit: 2022-09-02 | Discharge: 2022-09-02 | Disposition: A | Discharge status: Home / Self Care | Payer: 59 | Source: Ambulatory Visit | Attending: Radiation Oncology | Admitting: Radiation Oncology

## 2022-09-02 ENCOUNTER — Other Ambulatory Visit: Payer: Self-pay

## 2022-09-02 DIAGNOSIS — Z510 Encounter for antineoplastic radiation therapy: Secondary | ICD-10-CM | POA: Diagnosis not present

## 2022-09-02 DIAGNOSIS — Z79899 Other long term (current) drug therapy: Secondary | ICD-10-CM | POA: Diagnosis not present

## 2022-09-02 DIAGNOSIS — C3432 Malignant neoplasm of lower lobe, left bronchus or lung: Secondary | ICD-10-CM | POA: Diagnosis not present

## 2022-09-02 DIAGNOSIS — Z5111 Encounter for antineoplastic chemotherapy: Secondary | ICD-10-CM | POA: Diagnosis not present

## 2022-09-02 DIAGNOSIS — Z87891 Personal history of nicotine dependence: Secondary | ICD-10-CM | POA: Diagnosis not present

## 2022-09-02 DIAGNOSIS — Z5189 Encounter for other specified aftercare: Secondary | ICD-10-CM | POA: Diagnosis not present

## 2022-09-02 LAB — RAD ONC ARIA SESSION SUMMARY
Course Elapsed Days: 29
Plan Fractions Treated to Date: 21
Plan Prescribed Dose Per Fraction: 2 Gy
Plan Total Fractions Prescribed: 30
Plan Total Prescribed Dose: 60 Gy
Reference Point Dosage Given to Date: 42 Gy
Reference Point Session Dosage Given: 2 Gy
Session Number: 21

## 2022-09-02 NOTE — Progress Notes (Signed)
Rn notified by L4 of an area on concern on patients right ear. They described the area as red, resembling a horn in shape. They did note pt has not complained about this findings. They stated they noticed it and wanted a provider to take a look at when next seen by a provider. An in basket was sent to Dr. Kathrynn Running and Lysbeth Galas to make them aware.

## 2022-09-04 ENCOUNTER — Ambulatory Visit
Admission: RE | Admit: 2022-09-04 | Discharge: 2022-09-04 | Disposition: A | Discharge status: Home / Self Care | Payer: 59 | Source: Ambulatory Visit | Attending: Radiation Oncology | Admitting: Radiation Oncology

## 2022-09-04 ENCOUNTER — Ambulatory Visit: Admission: RE | Admit: 2022-09-04 | Payer: 59 | Source: Ambulatory Visit

## 2022-09-04 ENCOUNTER — Other Ambulatory Visit: Payer: Self-pay

## 2022-09-04 ENCOUNTER — Encounter: Payer: Self-pay | Admitting: Internal Medicine

## 2022-09-04 DIAGNOSIS — I10 Essential (primary) hypertension: Secondary | ICD-10-CM | POA: Diagnosis not present

## 2022-09-04 DIAGNOSIS — Z923 Personal history of irradiation: Secondary | ICD-10-CM | POA: Diagnosis not present

## 2022-09-04 DIAGNOSIS — D61818 Other pancytopenia: Secondary | ICD-10-CM | POA: Diagnosis not present

## 2022-09-04 DIAGNOSIS — I4891 Unspecified atrial fibrillation: Secondary | ICD-10-CM | POA: Diagnosis not present

## 2022-09-04 DIAGNOSIS — C3432 Malignant neoplasm of lower lobe, left bronchus or lung: Secondary | ICD-10-CM | POA: Diagnosis not present

## 2022-09-04 DIAGNOSIS — J449 Chronic obstructive pulmonary disease, unspecified: Secondary | ICD-10-CM | POA: Diagnosis not present

## 2022-09-04 DIAGNOSIS — R262 Difficulty in walking, not elsewhere classified: Secondary | ICD-10-CM | POA: Diagnosis not present

## 2022-09-04 DIAGNOSIS — Z510 Encounter for antineoplastic radiation therapy: Secondary | ICD-10-CM | POA: Diagnosis not present

## 2022-09-04 DIAGNOSIS — D6959 Other secondary thrombocytopenia: Secondary | ICD-10-CM | POA: Diagnosis not present

## 2022-09-04 DIAGNOSIS — T451X5A Adverse effect of antineoplastic and immunosuppressive drugs, initial encounter: Secondary | ICD-10-CM | POA: Diagnosis not present

## 2022-09-04 DIAGNOSIS — Z7982 Long term (current) use of aspirin: Secondary | ICD-10-CM | POA: Diagnosis not present

## 2022-09-04 DIAGNOSIS — N179 Acute kidney failure, unspecified: Secondary | ICD-10-CM | POA: Diagnosis not present

## 2022-09-04 DIAGNOSIS — G8929 Other chronic pain: Secondary | ICD-10-CM | POA: Diagnosis not present

## 2022-09-04 DIAGNOSIS — Z825 Family history of asthma and other chronic lower respiratory diseases: Secondary | ICD-10-CM | POA: Diagnosis not present

## 2022-09-04 DIAGNOSIS — E876 Hypokalemia: Secondary | ICD-10-CM | POA: Diagnosis not present

## 2022-09-04 DIAGNOSIS — Z888 Allergy status to other drugs, medicaments and biological substances status: Secondary | ICD-10-CM | POA: Diagnosis not present

## 2022-09-04 DIAGNOSIS — Z79899 Other long term (current) drug therapy: Secondary | ICD-10-CM | POA: Diagnosis not present

## 2022-09-04 DIAGNOSIS — D61810 Antineoplastic chemotherapy induced pancytopenia: Secondary | ICD-10-CM | POA: Diagnosis not present

## 2022-09-04 DIAGNOSIS — Z87891 Personal history of nicotine dependence: Secondary | ICD-10-CM | POA: Diagnosis not present

## 2022-09-04 DIAGNOSIS — Z8249 Family history of ischemic heart disease and other diseases of the circulatory system: Secondary | ICD-10-CM | POA: Diagnosis not present

## 2022-09-04 DIAGNOSIS — R0600 Dyspnea, unspecified: Secondary | ICD-10-CM | POA: Diagnosis not present

## 2022-09-04 DIAGNOSIS — Z886 Allergy status to analgesic agent status: Secondary | ICD-10-CM | POA: Diagnosis not present

## 2022-09-04 DIAGNOSIS — K219 Gastro-esophageal reflux disease without esophagitis: Secondary | ICD-10-CM | POA: Diagnosis not present

## 2022-09-04 DIAGNOSIS — I350 Nonrheumatic aortic (valve) stenosis: Secondary | ICD-10-CM | POA: Diagnosis not present

## 2022-09-04 DIAGNOSIS — D701 Agranulocytosis secondary to cancer chemotherapy: Secondary | ICD-10-CM | POA: Diagnosis not present

## 2022-09-04 LAB — RAD ONC ARIA SESSION SUMMARY
Course Elapsed Days: 31
Plan Fractions Treated to Date: 22
Plan Prescribed Dose Per Fraction: 2 Gy
Plan Total Fractions Prescribed: 30
Plan Total Prescribed Dose: 60 Gy
Reference Point Dosage Given to Date: 44 Gy
Reference Point Session Dosage Given: 2 Gy
Session Number: 22

## 2022-09-07 ENCOUNTER — Ambulatory Visit: Payer: 59

## 2022-09-07 ENCOUNTER — Telehealth: Payer: Self-pay | Admitting: *Deleted

## 2022-09-07 ENCOUNTER — Other Ambulatory Visit: Payer: Self-pay

## 2022-09-07 ENCOUNTER — Inpatient Hospital Stay (HOSPITAL_COMMUNITY)
Admission: EM | Admit: 2022-09-07 | Discharge: 2022-09-14 | DRG: 809 | Disposition: A | Discharge status: Home Health | Payer: 59 | Attending: Family Medicine | Admitting: Family Medicine

## 2022-09-07 ENCOUNTER — Emergency Department (HOSPITAL_COMMUNITY): Payer: 59

## 2022-09-07 ENCOUNTER — Telehealth: Payer: Self-pay | Admitting: Radiation Oncology

## 2022-09-07 DIAGNOSIS — J449 Chronic obstructive pulmonary disease, unspecified: Secondary | ICD-10-CM | POA: Diagnosis not present

## 2022-09-07 DIAGNOSIS — D61810 Antineoplastic chemotherapy induced pancytopenia: Secondary | ICD-10-CM | POA: Diagnosis not present

## 2022-09-07 DIAGNOSIS — Z886 Allergy status to analgesic agent status: Secondary | ICD-10-CM | POA: Diagnosis not present

## 2022-09-07 DIAGNOSIS — R7989 Other specified abnormal findings of blood chemistry: Secondary | ICD-10-CM | POA: Diagnosis not present

## 2022-09-07 DIAGNOSIS — I480 Paroxysmal atrial fibrillation: Secondary | ICD-10-CM | POA: Insufficient documentation

## 2022-09-07 DIAGNOSIS — Z888 Allergy status to other drugs, medicaments and biological substances status: Secondary | ICD-10-CM | POA: Diagnosis not present

## 2022-09-07 DIAGNOSIS — Z510 Encounter for antineoplastic radiation therapy: Secondary | ICD-10-CM | POA: Diagnosis not present

## 2022-09-07 DIAGNOSIS — F419 Anxiety disorder, unspecified: Secondary | ICD-10-CM | POA: Diagnosis present

## 2022-09-07 DIAGNOSIS — E876 Hypokalemia: Secondary | ICD-10-CM | POA: Diagnosis not present

## 2022-09-07 DIAGNOSIS — K219 Gastro-esophageal reflux disease without esophagitis: Secondary | ICD-10-CM | POA: Diagnosis not present

## 2022-09-07 DIAGNOSIS — D701 Agranulocytosis secondary to cancer chemotherapy: Secondary | ICD-10-CM | POA: Diagnosis not present

## 2022-09-07 DIAGNOSIS — D61818 Other pancytopenia: Secondary | ICD-10-CM | POA: Diagnosis not present

## 2022-09-07 DIAGNOSIS — R6889 Other general symptoms and signs: Secondary | ICD-10-CM | POA: Diagnosis not present

## 2022-09-07 DIAGNOSIS — Z79899 Other long term (current) drug therapy: Secondary | ICD-10-CM | POA: Diagnosis not present

## 2022-09-07 DIAGNOSIS — Z8249 Family history of ischemic heart disease and other diseases of the circulatory system: Secondary | ICD-10-CM

## 2022-09-07 DIAGNOSIS — Z743 Need for continuous supervision: Secondary | ICD-10-CM | POA: Diagnosis not present

## 2022-09-07 DIAGNOSIS — Z825 Family history of asthma and other chronic lower respiratory diseases: Secondary | ICD-10-CM

## 2022-09-07 DIAGNOSIS — T451X5A Adverse effect of antineoplastic and immunosuppressive drugs, initial encounter: Secondary | ICD-10-CM | POA: Diagnosis present

## 2022-09-07 DIAGNOSIS — G8929 Other chronic pain: Secondary | ICD-10-CM | POA: Diagnosis present

## 2022-09-07 DIAGNOSIS — C3432 Malignant neoplasm of lower lobe, left bronchus or lung: Secondary | ICD-10-CM | POA: Diagnosis present

## 2022-09-07 DIAGNOSIS — Z923 Personal history of irradiation: Secondary | ICD-10-CM

## 2022-09-07 DIAGNOSIS — R262 Difficulty in walking, not elsewhere classified: Secondary | ICD-10-CM | POA: Diagnosis present

## 2022-09-07 DIAGNOSIS — D649 Anemia, unspecified: Secondary | ICD-10-CM | POA: Diagnosis not present

## 2022-09-07 DIAGNOSIS — I4891 Unspecified atrial fibrillation: Secondary | ICD-10-CM | POA: Diagnosis not present

## 2022-09-07 DIAGNOSIS — I350 Nonrheumatic aortic (valve) stenosis: Secondary | ICD-10-CM | POA: Diagnosis not present

## 2022-09-07 DIAGNOSIS — D6959 Other secondary thrombocytopenia: Secondary | ICD-10-CM | POA: Diagnosis present

## 2022-09-07 DIAGNOSIS — Z87891 Personal history of nicotine dependence: Secondary | ICD-10-CM

## 2022-09-07 DIAGNOSIS — I10 Essential (primary) hypertension: Secondary | ICD-10-CM | POA: Diagnosis not present

## 2022-09-07 DIAGNOSIS — Z7982 Long term (current) use of aspirin: Secondary | ICD-10-CM | POA: Diagnosis not present

## 2022-09-07 DIAGNOSIS — N179 Acute kidney failure, unspecified: Secondary | ICD-10-CM | POA: Diagnosis present

## 2022-09-07 DIAGNOSIS — R0600 Dyspnea, unspecified: Secondary | ICD-10-CM | POA: Diagnosis not present

## 2022-09-07 DIAGNOSIS — R531 Weakness: Secondary | ICD-10-CM | POA: Diagnosis not present

## 2022-09-07 DIAGNOSIS — R011 Cardiac murmur, unspecified: Secondary | ICD-10-CM | POA: Diagnosis present

## 2022-09-07 LAB — COMPREHENSIVE METABOLIC PANEL
ALT: 11 U/L (ref 0–44)
AST: 9 U/L — ABNORMAL LOW (ref 15–41)
Albumin: 3.3 g/dL — ABNORMAL LOW (ref 3.5–5.0)
Alkaline Phosphatase: 53 U/L (ref 38–126)
Anion gap: 14 (ref 5–15)
BUN: 53 mg/dL — ABNORMAL HIGH (ref 8–23)
CO2: 31 mmol/L (ref 22–32)
Calcium: 8.4 mg/dL — ABNORMAL LOW (ref 8.9–10.3)
Chloride: 88 mmol/L — ABNORMAL LOW (ref 98–111)
Creatinine, Ser: 1.15 mg/dL — ABNORMAL HIGH (ref 0.44–1.00)
GFR, Estimated: 50 mL/min — ABNORMAL LOW (ref >60)
Glucose, Bld: 129 mg/dL — ABNORMAL HIGH (ref 70–99)
Potassium: 2.9 mmol/L — ABNORMAL LOW (ref 3.5–5.1)
Sodium: 133 mmol/L — ABNORMAL LOW (ref 135–145)
Total Bilirubin: 1.9 mg/dL — ABNORMAL HIGH (ref 0.3–1.2)
Total Protein: 6.7 g/dL (ref 6.5–8.1)

## 2022-09-07 LAB — TYPE AND SCREEN: Antibody Screen: NEGATIVE

## 2022-09-07 LAB — CBC WITH DIFFERENTIAL/PLATELET
Abs Immature Granulocytes: 0.01 10*3/uL (ref 0.00–0.07)
Abs Immature Granulocytes: 0 10*3/uL (ref 0.00–0.07)
Basophils Absolute: 0 10*3/uL (ref 0.0–0.1)
Basophils Absolute: 0 10*3/uL (ref 0.0–0.1)
Basophils Relative: 1 %
Basophils Relative: 1 %
Eosinophils Absolute: 0 10*3/uL (ref 0.0–0.5)
Eosinophils Absolute: 0 10*3/uL (ref 0.0–0.5)
Eosinophils Relative: 0 %
Eosinophils Relative: 0 %
HCT: 15.3 % — ABNORMAL LOW (ref 36.0–46.0)
HCT: 14.6 % — ABNORMAL LOW (ref 36.0–46.0)
Hemoglobin: 5 g/dL — CL (ref 12.0–15.0)
Hemoglobin: 4.7 g/dL — CL (ref 12.0–15.0)
Immature Granulocytes: 1 %
Lymphocytes Relative: 9 %
Lymphocytes Relative: 9 %
Lymphs Abs: 0.1 10*3/uL — ABNORMAL LOW (ref 0.7–4.0)
Lymphs Abs: 0.1 10*3/uL — ABNORMAL LOW (ref 0.7–4.0)
MCH: 30.5 pg (ref 26.0–34.0)
MCH: 30.5 pg (ref 26.0–34.0)
MCHC: 32.7 g/dL (ref 30.0–36.0)
MCHC: 32.2 g/dL (ref 30.0–36.0)
MCV: 93.3 fL (ref 80.0–100.0)
MCV: 94.8 fL (ref 80.0–100.0)
Monocytes Absolute: 0.1 10*3/uL (ref 0.1–1.0)
Monocytes Absolute: 0.1 10*3/uL (ref 0.1–1.0)
Monocytes Relative: 11 %
Monocytes Relative: 8 %
Neutro Abs: 0.8 10*3/uL — ABNORMAL LOW (ref 1.7–7.7)
Neutro Abs: 0.9 10*3/uL — ABNORMAL LOW (ref 1.7–7.7)
Neutrophils Relative %: 78 %
Neutrophils Relative %: 82 %
Platelets: <5 10*3/uL — CL (ref 150–400)
Platelets: <5 10*3/uL — CL (ref 150–400)
RBC: 1.64 MIL/uL — ABNORMAL LOW (ref 3.87–5.11)
RBC: 1.54 MIL/uL — ABNORMAL LOW (ref 3.87–5.11)
RDW: 14.2 % (ref 11.5–15.5)
RDW: 14.2 % (ref 11.5–15.5)
WBC: 1 10*3/uL — CL (ref 4.0–10.5)
WBC: 1.1 10*3/uL — CL (ref 4.0–10.5)
nRBC: 0 % (ref 0.0–0.2)
nRBC: 0 % (ref 0.0–0.2)

## 2022-09-07 LAB — BASIC METABOLIC PANEL
Anion gap: 17 — ABNORMAL HIGH (ref 5–15)
BUN: 51 mg/dL — ABNORMAL HIGH (ref 8–23)
CO2: 27 mmol/L (ref 22–32)
Calcium: 8.3 mg/dL — ABNORMAL LOW (ref 8.9–10.3)
Chloride: 90 mmol/L — ABNORMAL LOW (ref 98–111)
Creatinine, Ser: 1.23 mg/dL — ABNORMAL HIGH (ref 0.44–1.00)
GFR, Estimated: 46 mL/min — ABNORMAL LOW (ref >60)
Glucose, Bld: 152 mg/dL — ABNORMAL HIGH (ref 70–99)
Potassium: 3 mmol/L — ABNORMAL LOW (ref 3.5–5.1)
Sodium: 134 mmol/L — ABNORMAL LOW (ref 135–145)

## 2022-09-07 LAB — URINALYSIS, W/ REFLEX TO CULTURE (INFECTION SUSPECTED)
Bilirubin Urine: NEGATIVE
Glucose, UA: NEGATIVE mg/dL
Ketones, ur: NEGATIVE mg/dL
Nitrite: NEGATIVE
Protein, ur: NEGATIVE mg/dL
Specific Gravity, Urine: 1.011 (ref 1.005–1.030)
pH: 5 (ref 5.0–8.0)

## 2022-09-07 LAB — PREPARE RBC (CROSSMATCH)

## 2022-09-07 LAB — PREPARE PLATELET PHERESIS: Unit division: 0

## 2022-09-07 LAB — MAGNESIUM: Magnesium: 1.5 mg/dL — ABNORMAL LOW (ref 1.7–2.4)

## 2022-09-07 LAB — BPAM RBC: Unit Type and Rh: 600

## 2022-09-07 LAB — CBG MONITORING, ED: Glucose-Capillary: 115 mg/dL — ABNORMAL HIGH (ref 70–99)

## 2022-09-07 LAB — TROPONIN I (HIGH SENSITIVITY)
Troponin I (High Sensitivity): 13 ng/L (ref <18)
Troponin I (High Sensitivity): 14 ng/L (ref <18)

## 2022-09-07 MED ORDER — SUCRALFATE 1 GM/10ML PO SUSP
1.0000 g | Freq: Three times a day (TID) | ORAL | Status: DC
  Administered 2022-09-07 – 2022-09-14 (×28): 1 g via ORAL
  Filled 2022-09-07 (×28): qty 10

## 2022-09-07 MED ORDER — ONDANSETRON HCL 4 MG/2ML IJ SOLN: 4.0000 mg | Freq: Four times a day (QID) | INTRAMUSCULAR | Status: DC | PRN

## 2022-09-07 MED ORDER — POTASSIUM CHLORIDE CRYS ER 20 MEQ PO TBCR
40.0000 meq | EXTENDED_RELEASE_TABLET | Freq: Once | ORAL | Status: AC
  Administered 2022-09-07: 40 meq via ORAL
  Filled 2022-09-07: qty 2

## 2022-09-07 MED ORDER — TRAZODONE HCL 50 MG PO TABS
25.0000 mg | ORAL_TABLET | Freq: Every evening | ORAL | Status: DC | PRN
  Administered 2022-09-08 – 2022-09-13 (×6): 25 mg via ORAL
  Filled 2022-09-07 (×6): qty 1

## 2022-09-07 MED ORDER — SODIUM CHLORIDE 0.9% IV SOLUTION: Freq: Once | INTRAVENOUS | Status: DC

## 2022-09-07 MED ORDER — LACTATED RINGERS IV BOLUS
1000.0000 mL | Freq: Once | INTRAVENOUS | Status: AC
  Administered 2022-09-07: 1000 mL via INTRAVENOUS

## 2022-09-07 MED ORDER — SENNOSIDES-DOCUSATE SODIUM 8.6-50 MG PO TABS: 1.0000 | ORAL_TABLET | Freq: Every evening | ORAL | Status: DC | PRN

## 2022-09-07 MED ORDER — ACETAMINOPHEN 650 MG RE SUPP: 650.0000 mg | Freq: Four times a day (QID) | RECTAL | Status: DC | PRN

## 2022-09-07 MED ORDER — ACETAMINOPHEN 325 MG PO TABS
650.0000 mg | ORAL_TABLET | Freq: Four times a day (QID) | ORAL | Status: DC | PRN
  Administered 2022-09-11 – 2022-09-14 (×3): 650 mg via ORAL
  Filled 2022-09-07 (×3): qty 2

## 2022-09-07 MED ORDER — ONDANSETRON HCL 4 MG PO TABS: 4.0000 mg | ORAL_TABLET | Freq: Four times a day (QID) | ORAL | Status: DC | PRN

## 2022-09-07 MED ORDER — SODIUM CHLORIDE 0.9 % IV SOLN: INTRAVENOUS | Status: AC

## 2022-09-07 MED ORDER — ALBUTEROL SULFATE (2.5 MG/3ML) 0.083% IN NEBU: 2.5000 mg | INHALATION_SOLUTION | RESPIRATORY_TRACT | Status: DC | PRN

## 2022-09-07 MED ORDER — FAMOTIDINE 20 MG PO TABS
10.0000 mg | ORAL_TABLET | Freq: Every day | ORAL | Status: DC | PRN
  Filled 2022-09-07: qty 1

## 2022-09-07 MED ORDER — MAGNESIUM SULFATE 2 GM/50ML IV SOLN
2.0000 g | Freq: Once | INTRAVENOUS | Status: AC
  Administered 2022-09-07: 2 g via INTRAVENOUS
  Filled 2022-09-07: qty 50

## 2022-09-07 MED ORDER — TRAMADOL HCL 50 MG PO TABS
25.0000 mg | ORAL_TABLET | Freq: Two times a day (BID) | ORAL | Status: DC | PRN
  Administered 2022-09-08 – 2022-09-09 (×3): 50 mg via ORAL
  Administered 2022-09-10: 25 mg via ORAL
  Administered 2022-09-11 – 2022-09-12 (×3): 50 mg via ORAL
  Administered 2022-09-13: 25 mg via ORAL
  Filled 2022-09-07 (×8): qty 1
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
## 2022-09-07 NOTE — ED Notes (Signed)
Critical value called in from lab, taken by Rexanne Mano, Paramedic. WBC 1.1, Hgb 4.7 and Platelets <5. Provider was notified

## 2022-09-07 NOTE — ED Provider Notes (Signed)
[place] EMERGENCY DEPARTMENT AT [facility] Provider Note   CSN: [id number] Arrival date & time: 09/07/22  1158     History  Chief Complaint  Patient presents with   Weakness    Sheena [name] is a 77 y.o. female.  HPI 77 year old female with a history of small cell lung cancer being treated with radiation and chemotherapy presents with weakness.  She has had weakness associated with the treatment ever since starting it but over the last week or less she has been worse.  She is having trouble walking and now is developing shortness of breath when walking.  This is worst over the last few days or so.  No significant or new cough.  No headache, chest pain, or focal weakness but has no strength.  Her blood pressure was 92/38 this morning.  She has had less appetite and less p.o. intake.  She did vomit after trying to have a milkshake a few days ago but otherwise has not had vomiting.  Home Medications Prior to Admission medications   Medication Sig Start Date End Date Taking? Authorizing Provider  acetaminophen (TYLENOL) 500 MG tablet Take 1,000 mg by mouth 2 (two) times daily as needed for mild pain. Reported on 06/04/2015    [provider]  amLODipine (NORVASC) 5 MG tablet TAKE 1 TABLET (5 MG TOTAL) BY MOUTH DAILY. 07/13/22   [name], MD  aspirin EC 81 MG tablet Take 81 mg by mouth 2 (two) times a week.    [provider]  atorvastatin (LIPITOR) 20 MG tablet TAKE 1 TABLET BY MOUTH EVERY DAY Patient taking differently: Take 20 mg by mouth at bedtime. 05/25/22   [name], MD  Calcium Citrate-Vitamin D (CALCIUM CITRATE PETITE/VIT D PO) Take 1 tablet by mouth 2 (two) times daily.    [provider]  cyclobenzaprine (FLEXERIL) 5 MG tablet TAKE 1 TABLET BY MOUTH TWICE A DAY AS NEEDED FOR MUSCLE SPASM Patient not taking: Reported on 08/04/2022 07/13/22   [name], MD  famotidine (PEPCID) 20 MG tablet Take 20 mg by mouth daily as  needed for heartburn or indigestion.    [provider]  furosemide (LASIX) 40 MG tablet TAKE 1 TABLET BY MOUTH EVERY DAY 07/13/22   [name], MD  lidocaine-prilocaine (EMLA) cream Apply 1 Application topically as needed. 08/04/22   Heilingoetter, Cassandra L, PA-C  metoprolol tartrate (LOPRESSOR) 50 MG tablet TAKE 1 TABLET BY MOUTH TWICE A DAY 08/27/22   [name], MD  prochlorperazine (COMPAZINE) 10 MG tablet Take 1 tablet (10 mg total) by mouth every 6 (six) hours as needed for nausea or vomiting. 08/04/22   Heilingoetter, Cassandra L, PA-C  sucralfate (CARAFATE) 1 GM/10ML suspension Take 10 mLs (1 g total) by mouth 4 (four) times daily -  with meals and at bedtime. 08/26/22   Ronny Bacon, PA-C  traMADol (ULTRAM) 50 MG tablet Take 0.5-1 tablets (25-50 mg total) by mouth 2 (two) times daily as needed. For chronic low back pain. May refill monthly 05/07/22   [name], MD      Allergies    Antihistamines, diphenhydramine-type; Aspirin; and Evista [raloxifene]    Review of Systems   Review of Systems  Constitutional:  Positive for fatigue. Negative for fever.  Respiratory:  Positive for shortness of breath.   Cardiovascular:  Negative for chest pain.  Gastrointestinal:  Negative for abdominal pain.  Genitourinary:  Negative for dysuria.  Neurological:  Positive  for weakness. Negative for headaches.    Physical Exam Updated Vital Signs BP (!) 120/52 (BP Location: Right Arm)   Pulse 87   Temp 97.9 F (36.6 C) (Oral)   Resp 16   Ht 5' (1.524 m)   Wt 47.2 kg   SpO2 98%   BMI 20.31 kg/m  Physical Exam Vitals and nursing note reviewed.  Constitutional:      Appearance: She is well-developed.  HENT:     Head: Normocephalic and atraumatic.     Mouth/Throat:     Mouth: Mucous membranes are dry.  Eyes:     Extraocular Movements: Extraocular movements intact.     Pupils: Pupils are equal, round, and reactive to light.  Cardiovascular:     Rate  and Rhythm: Normal rate and regular rhythm.     Heart sounds: Normal heart sounds.  Pulmonary:     Effort: Pulmonary effort is normal.     Breath sounds: Normal breath sounds.  Abdominal:     Palpations: Abdomen is soft.     Tenderness: There is no abdominal tenderness.  Skin:    General: Skin is warm and dry.     Comments: Mild petechiae to bilateral arms  Neurological:     Mental Status: She is alert and oriented to person, place, and time.     Comments: CN 3-12 grossly intact.  Equal but weak strength in all 4 extremities.      ED Results / Procedures / Treatments   Labs (all labs ordered are listed, but only abnormal results are displayed) Labs Reviewed  COMPREHENSIVE METABOLIC PANEL - Abnormal; Notable for the following components:      Result Value   Sodium 133 (*)    Potassium 2.9 (*)    Chloride 88 (*)    Glucose, Bld 129 (*)    BUN 53 (*)    Creatinine, Ser 1.15 (*)    Calcium 8.4 (*)    Albumin 3.3 (*)    AST 9 (*)    Total Bilirubin 1.9 (*)    GFR, Estimated 50 (*)    All other components within normal limits  CBC WITH DIFFERENTIAL/PLATELET - Abnormal; Notable for the following components:   WBC 1.0 (*)    RBC 1.64 (*)    Hemoglobin 5.0 (*)    HCT 15.3 (*)    Platelets <5 (*)    Neutro Abs 0.8 (*)    Lymphs Abs 0.1 (*)    All other components within normal limits  URINALYSIS, W/ REFLEX TO CULTURE (INFECTION SUSPECTED) - Abnormal; Notable for the following components:   Hgb urine dipstick MODERATE (*)    Leukocytes,Ua TRACE (*)    Bacteria, UA RARE (*)    Non Squamous Epithelial 0-5 (*)    All other components within normal limits  MAGNESIUM - Abnormal; Notable for the following components:   Magnesium 1.5 (*)    All other components within normal limits  CBC WITH DIFFERENTIAL/PLATELET - Abnormal; Notable for the following components:   WBC 1.1 (*)    RBC 1.54 (*)    Hemoglobin 4.7 (*)    HCT 14.6 (*)    Platelets <5 (*)    Neutro Abs 0.9 (*)     Lymphs Abs 0.1 (*)    All other components within normal limits  BASIC METABOLIC PANEL - Abnormal; Notable for the following components:   Sodium 134 (*)    Potassium 3.0 (*)    Chloride 90 (*)  Glucose, Bld 152 (*)    BUN 51 (*)    Creatinine, Ser 1.23 (*)    Calcium 8.3 (*)    GFR, Estimated 46 (*)    Anion gap 17 (*)    All other components within normal limits  CBG MONITORING, ED - Abnormal; Notable for the following components:   Glucose-Capillary 115 (*)    All other components within normal limits  TYPE AND SCREEN  PREPARE RBC (CROSSMATCH)  PREPARE PLATELET PHERESIS  TROPONIN I (HIGH SENSITIVITY)  TROPONIN I (HIGH SENSITIVITY)    EKG EKG Interpretation Date/Time:  Monday September 07 2022 14:10:25 EDT Ventricular Rate:  84 PR Interval:  153 QRS Duration:  88 QT Interval:  409 QTC Calculation: 484 R Axis:   55  Text Interpretation: Sinus rhythm Abnormal R-wave progression, early transition Minimal ST depression, anterior leads Confirmed by Pricilla Loveless [phone]) on 09/07/2022 3:20:05 PM  Radiology DG Chest 2 View  Result Date: 09/07/2022 CLINICAL DATA:  Dyspnea EXAM: CHEST - 2 VIEW COMPARISON:  Previous studies including CT chest done on 08/20/2022 FINDINGS: Apparent shift of mediastinum to the right may be due to rotation. Cardiac size is within normal limits. There are no signs of pulmonary edema or focal pulmonary consolidation. Increase in AP diameter of chest suggests COPD. There is no pleural effusion or pneumothorax. There is decrease in height of multiple thoracic vertebral bodies with no significant interval change. There is previous vertebroplasty in 1 of the lower thoracic vertebral bodies. Tip of right IJ chest port is seen in superior vena cava. IMPRESSION: COPD. There are no signs of pulmonary edema or focal pulmonary consolidation. Electronically Signed   By: Ernie Avena M.D.   On: 09/07/2022 13:06    Procedures .Critical Care  Performed by:  Pricilla Loveless, MD Authorized by: Pricilla Loveless, MD   Critical care provider statement:    Critical care time (minutes):  40   Critical care time was exclusive of:  Separately billable procedures and treating other patients   Critical care was necessary to treat or prevent imminent or life-threatening deterioration of the following conditions:  Circulatory failure and shock   Critical care was time spent personally by me on the following activities:  Development of treatment plan with patient or surrogate, discussions with consultants, evaluation of patient's response to treatment, examination of patient, ordering and review of laboratory studies, ordering and review of radiographic studies, ordering and performing treatments and interventions, pulse oximetry, re-evaluation of patient's condition and review of old charts     Medications Ordered in ED Medications  potassium chloride SA (KLOR-CON M) CR tablet 40 mEq (has no administration in time range)  magnesium sulfate IVPB 2 g 50 mL (has no administration in time range)  0.9 %  sodium chloride infusion (Manually program via Guardrails IV Fluids) (has no administration in time range)  lactated ringers bolus 1,000 mL (1,000 mLs Intravenous New Bag/Given 09/07/22 1302)    ED Course/ Medical Decision Making/ A&P                             Medical Decision Making Amount and/or Complexity of Data Reviewed Labs: ordered.    Details: Mild hypokalemia with a mild AKI.  Also found to have significant anemia with a hemoglobin around 5, leukopenia, and thrombocytopenia. Radiology: ordered.    Details: No pneumonia ECG/medicine tests: ordered and independent interpretation performed.    Details: Nonspecific ST changes.  Risk Prescription  drug management. Decision regarding hospitalization.   Patient presents with generalized weakness.  She had some soft blood pressures at home but here her vitals are normal.  However she is found to have a  mild AKI with some hypokalemia, these are probably related to poor p.o. intake.  However she is also found to have pancytopenia.  Her last chemotherapy was about 10 days ago.  Discussed with her oncologist, Dr. Arbutus Ped.  He recommends 2 units of blood and a unit of platelets.  He did agree with rechecking as the original CBC was taken from her previously placed IV though a recheck straight stick confirms these values.  I suspect this is why she is so short of breath and weak.  She will be given the units of blood as well as a unit of platelets.  No overt bleeding at this time.  No headache.  She has not had any signs or symptoms of bleeding.  Will need admission and discussed with hospitalist, Dr. Andree Moro.        Final Clinical Impression(s) / ED Diagnoses Final diagnoses:  Pancytopenia (HCC)  Hypokalemia  Acute kidney injury [facility])    Rx / DC Orders ED Discharge Orders     None         Pricilla Loveless, MD 09/07/22 1557

## 2022-09-07 NOTE — ED Notes (Addendum)
RN called 4th floor and s/w Lawanna Kobus, Diplomatic Services operational officer informing her patient is about to be transported

## 2022-09-07 NOTE — Telephone Encounter (Signed)
7/8 @ 9:21 am received call from patient's husband.  He wanted someone to know, that patient has been really weak and collapsed this morning getting out of bed and feeling her heart beat in her head.  Called and spoke to [employer] and sent email to [employer] and [employer] machine so they are aware.

## 2022-09-07 NOTE — ED Triage Notes (Signed)
Patient brought in by EMS from home with c/o increased weakness x3 days. Patient is a cancer patient who was scheduled for radiation today @1145 , patient states she has had weakness and no appetite. RN called cancer center and informed them patient is in ED. Per secretary patient is not scheduled for appointment.  98/50 80 95% RA CBG:  108
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
## 2022-09-07 NOTE — Telephone Encounter (Signed)
Returned call to patient and spouse.  He states the patient is not feeling well, she is very weak to the point of collapsing the other night.  She has been having a pounding headache that feels like her heart is beating in her head.  She has not been eating or drinking well and her BP this morning was 92/38.  She was advised to go to the emergency room for evaluation.  Her husband asked if he should drive her, they were advised if they could safely get down the stairs and out the house driving would be fine but if not to call the ambulance.  They verbalized understanding.  They were informed we would check in with her tomorrow.  Lind Covert RN, BSN

## 2022-09-08 ENCOUNTER — Ambulatory Visit: Payer: 59

## 2022-09-08 ENCOUNTER — Other Ambulatory Visit: Payer: Self-pay

## 2022-09-08 ENCOUNTER — Inpatient Hospital Stay: Payer: 59

## 2022-09-08 DIAGNOSIS — E876 Hypokalemia: Secondary | ICD-10-CM

## 2022-09-08 DIAGNOSIS — T451X5A Adverse effect of antineoplastic and immunosuppressive drugs, initial encounter: Secondary | ICD-10-CM

## 2022-09-08 DIAGNOSIS — D61818 Other pancytopenia: Secondary | ICD-10-CM

## 2022-09-08 DIAGNOSIS — D701 Agranulocytosis secondary to cancer chemotherapy: Secondary | ICD-10-CM | POA: Diagnosis not present

## 2022-09-08 DIAGNOSIS — N179 Acute kidney failure, unspecified: Secondary | ICD-10-CM

## 2022-09-08 LAB — BASIC METABOLIC PANEL
Anion gap: 12 (ref 5–15)
BUN: 39 mg/dL — ABNORMAL HIGH (ref 8–23)
CO2: 31 mmol/L (ref 22–32)
Calcium: 8.2 mg/dL — ABNORMAL LOW (ref 8.9–10.3)
Chloride: 94 mmol/L — ABNORMAL LOW (ref 98–111)
Creatinine, Ser: 0.86 mg/dL (ref 0.44–1.00)
GFR, Estimated: >60 mL/min (ref >60)
Glucose, Bld: 119 mg/dL — ABNORMAL HIGH (ref 70–99)
Potassium: 2.7 mmol/L — CL (ref 3.5–5.1)
Sodium: 137 mmol/L (ref 135–145)

## 2022-09-08 LAB — CBC WITH DIFFERENTIAL/PLATELET
Abs Immature Granulocytes: 0.03 10*3/uL (ref 0.00–0.07)
Basophils Absolute: 0 10*3/uL (ref 0.0–0.1)
Basophils Relative: 1 %
Eosinophils Absolute: 0 10*3/uL (ref 0.0–0.5)
Eosinophils Relative: 1 %
HCT: 25.1 % — ABNORMAL LOW (ref 36.0–46.0)
Hemoglobin: 8.7 g/dL — ABNORMAL LOW (ref 12.0–15.0)
Immature Granulocytes: 2 %
Lymphocytes Relative: 9 %
Lymphs Abs: 0.1 10*3/uL — ABNORMAL LOW (ref 0.7–4.0)
MCH: 31.4 pg (ref 26.0–34.0)
MCHC: 34.7 g/dL (ref 30.0–36.0)
MCV: 90.6 fL (ref 80.0–100.0)
Monocytes Absolute: 0.1 10*3/uL (ref 0.1–1.0)
Monocytes Relative: 9 %
Neutro Abs: 1.2 10*3/uL — ABNORMAL LOW (ref 1.7–7.7)
Neutrophils Relative %: 78 %
Platelets: 24 10*3/uL — CL (ref 150–400)
RBC: 2.77 MIL/uL — ABNORMAL LOW (ref 3.87–5.11)
RDW: 13.8 % (ref 11.5–15.5)
WBC: 1.5 10*3/uL — ABNORMAL LOW (ref 4.0–10.5)
nRBC: 0 % (ref 0.0–0.2)

## 2022-09-08 LAB — BPAM RBC
ISSUE DATE / TIME: 202407082110
ISSUE DATE / TIME: 202407090026

## 2022-09-08 LAB — TYPE AND SCREEN: Unit division: 0

## 2022-09-08 LAB — BPAM PLATELET PHERESIS
ISSUE DATE / TIME: 202407090358
Unit Type and Rh: 5100

## 2022-09-08 LAB — MAGNESIUM: Magnesium: 2.1 mg/dL (ref 1.7–2.4)

## 2022-09-08 MED ORDER — POTASSIUM CHLORIDE 10 MEQ/100ML IV SOLN
10.0000 meq | INTRAVENOUS | Status: AC
  Administered 2022-09-08 (×5): 10 meq via INTRAVENOUS
  Filled 2022-09-08 (×5): qty 100

## 2022-09-08 MED ORDER — METOPROLOL TARTRATE 25 MG PO TABS
25.0000 mg | ORAL_TABLET | Freq: Two times a day (BID) | ORAL | Status: DC
  Administered 2022-09-08 – 2022-09-09 (×4): 25 mg via ORAL
  Filled 2022-09-08 (×5): qty 1

## 2022-09-08 MED ORDER — TBO-FILGRASTIM 300 MCG/0.5ML ~~LOC~~ SOSY
300.0000 ug | PREFILLED_SYRINGE | Freq: Every day | SUBCUTANEOUS | Status: DC
  Administered 2022-09-08 – 2022-09-10 (×3): 300 ug via SUBCUTANEOUS
  Filled 2022-09-08 (×3): qty 0.5

## 2022-09-08 MED ORDER — SODIUM CHLORIDE 0.9% FLUSH
10.0000 mL | INTRAVENOUS | Status: DC | PRN
  Administered 2022-09-08: 10 mL

## 2022-09-08 MED ORDER — CHLORHEXIDINE GLUCONATE CLOTH 2 % EX PADS
6.0000 | MEDICATED_PAD | Freq: Every day | CUTANEOUS | Status: DC
  Administered 2022-09-08 – 2022-09-14 (×7): 6 via TOPICAL

## 2022-09-08 NOTE — Progress Notes (Signed)
Notified by lab of critical value for potassium of 2.7. Dr. Sharl Ma was notified by secure chat and acknowledged the message.

## 2022-09-08 NOTE — Plan of Care (Signed)

## 2022-09-08 NOTE — Progress Notes (Signed)
[facility]  PROGRESS NOTE  Hiilani Haisten ION:[id number] DOB: [birth year] DOA: 09/07/2022 PCP: [name], MD   Brief HPI:    77 y.o. female with medical history significant for hypertension, small cell lung cancer on chemo and radiation being admitted to the hospital with severe symptomatic anemia as well as thrombocytopenia  Her last radiation was 7/5, and her last chemotherapy was a week before that.  Found to be relatively hypotensive, and lab work revealed hemoglobin 4.7, WBC 1.1, platelets less than 5, negative troponin x 2, creatinine up to 1.23 from normal baseline. Potassium 3.0, magnesium 1.5. She was given IV magnesium, IV potassium, and orders have been placed for 2 unit blood transfusion and 1 unit of packed platelets    Assessment/Plan:    Chemotherapy-induced pancytopenia -Dr. Arbutus Ped has seen the patient, started on Granix 300 mcg daily for neutropenia till absolute neutrophil count is over 1500 for 2 days -S/p 2 units PRBC, hemoglobin is 8.7.  Transfuse to keep hemoglobin greater than 8.0 -S/p 1 unit of platelets; platelet count has improved to 24,000.  Goal to keep platelet count over 20,000  Small cell lung cancer -S/p 3 cycles of chemotherapy and radiation treatment -Cycle #4 of chemotherapy as outpatient per Dr. Arbutus Ped  Hypertension -Metoprolol and Norvasc were held -Patient developed rebound tachycardia with heart rate in 140s to 200s -Will start back low-dose of metoprolol, 25 mg p.o. twice daily  Chronic pain -Continue Ultram as needed  Hypokalemia -Replace potassium and follow BMP in am  Hypomagnesemia -Replete    Medications     sodium chloride   Intravenous Once   Chlorhexidine Gluconate Cloth  6 each Topical Daily   metoprolol tartrate  25 mg Oral BID   sucralfate  1 g Oral TID WC & HS   Tbo-filgastrim (GRANIX) SQ  300 mcg Subcutaneous Daily     Data Reviewed:   CBG:  Recent Labs  Lab 09/07/22 1335  GLUCAP 115*     SpO2: 98 %    Vitals:   09/08/22 0422 09/08/22 0629 09/08/22 1210 09/08/22 1233  BP: (!) 98/52 (!) 100/46 123/73   Pulse: 84 82 (!) 142 (!) 127  Resp: 16 17 18    Temp: 97.8 F (36.6 C) 97.6 F (36.4 C) 97.7 F (36.5 C)   TempSrc:  Oral Oral   SpO2:  100% 100% 98%  Weight:      Height:          Data Reviewed:  Basic Metabolic Panel: Recent Labs  Lab 09/07/22 1303 09/07/22 1426 09/08/22 0856  NA 133* 134* 137  K 2.9* 3.0* 2.7*  CL 88* 90* 94*  CO2 31 27 31   GLUCOSE 129* 152* 119*  BUN 53* 51* 39*  CREATININE 1.15* 1.23* 0.86  CALCIUM 8.4* 8.3* 8.2*  MG 1.5*  --  2.1    CBC: Recent Labs  Lab 09/07/22 1303 09/07/22 1426 09/08/22 0856  WBC 1.0* 1.1* 1.5*  NEUTROABS 0.8* 0.9* 1.2*  HGB 5.0* 4.7* 8.7*  HCT 15.3* 14.6* 25.1*  MCV 93.3 94.8 90.6  PLT <5* <5* 24*    LFT Recent Labs  Lab 09/07/22 1303  AST 9*  ALT 11  ALKPHOS 53  BILITOT 1.9*  PROT 6.7  ALBUMIN 3.3*     Antibiotics: Anti-infectives (From admission, onward)    None        DVT prophylaxis: SCDs  Code Status: Full code  Family Communication: Discussed with patient's husband at bedside   CONSULTS  Subjective   Denies chest pain or shortness of breath.   Objective    Physical Examination:   General: Appears in no acute distress Cardiovascular: S1-S2, regular, no murmur auscultated Respiratory: Lungs clear to auscultation bilaterally Abdomen: Soft, nontender, no organomegaly Extremities: No edema in the lower extremities Neurologic: Alert, oriented x 3, no focal deficit noted   Status is: Inpatient: Thrombocytopenia, anemia        Mac Dowdell S Destyn Schuyler   [facility] If 7PM-7AM, please contact night-coverage at [email or web address], Office  [phone]   09/08/2022, 4:40 PM  LOS: 1 day

## 2022-09-08 NOTE — Progress Notes (Signed)
DIAGNOSIS: limited stage (T2a, N2, M0) small cell lung cancer presented with left lower lobe lung mass in addition to left hilar and mediastinal lymphadenopathy diagnosed in April 2024.    PRIOR THERAPY: None   CURRENT THERAPY: Systemic therapy with carboplatin for AUC of 5 on day 1 and etoposide 100 Mg/M2 on days 1, 2 and 3 every 3 weeks concurrent with radiation.  She is status post 3 cycles with almost complete response after cycle #2.    Subjective: The patient is seen and examined today.  She is feeling very well today with no concerning complaints except for small bruises on her upper extremities.  She denied having any fever or chills.  She has no nausea, vomiting, diarrhea or constipation.  She was admitted from the emergency department yesterday complaining of significant weakness and shortness of breath.  During her evaluation CBC showed significant pancytopenia.  Her total white blood count was 1.0 with hemoglobin of 5.0 and platelets count less than 5000.  The patient received 2 units of PRBCs transfusion last night in addition to 1 unit of platelets.  Repeat CBC today still pending.  Objective: Vital signs in last 24 hours: Temp:  [97 F (36.1 C)-98.7 F (37.1 C)] 97.6 F (36.4 C) (07/09 0629) Pulse Rate:  [79-101] 82 (07/09 0629) Resp:  [15-19] 17 (07/09 0629) BP: (98-120)/(46-61) 100/46 (07/09 0629) SpO2:  [95 %-100 %] 100 % (07/09 0629) Weight:  [104 lb (47.2 kg)] 104 lb (47.2 kg) (07/08 1216)  Intake/Output from previous day: 07/08 0701 - 07/09 0700 In: 1734 [P.O.:180; I.V.:12.5; Blood:991.5; IV Piggyback:550] Out: -  Intake/Output this shift: No intake/output data recorded.  General appearance: alert, cooperative, fatigued, and no distress Resp: clear to auscultation bilaterally Cardio: regular rate and rhythm, S1, S2 normal, no murmur, click, rub or gallop GI: soft, non-tender; bowel sounds normal; no masses,  no organomegaly Extremities: extremities normal,  atraumatic, no cyanosis or edema  Lab Results:  Recent Labs    09/07/22 1303 09/07/22 1426  WBC 1.0* 1.1*  HGB 5.0* 4.7*  HCT 15.3* 14.6*  PLT <5* <5*   BMET Recent Labs    09/07/22 1303 09/07/22 1426  NA 133* 134*  K 2.9* 3.0*  CL 88* 90*  CO2 31 27  GLUCOSE 129* 152*  BUN 53* 51*  CREATININE 1.15* 1.23*  CALCIUM 8.4* 8.3*    Studies/Results: DG Chest 2 View  Result Date: 09/07/2022 CLINICAL DATA:  Dyspnea EXAM: CHEST - 2 VIEW COMPARISON:  Previous studies including CT chest done on 08/20/2022 FINDINGS: Apparent shift of mediastinum to the right may be due to rotation. Cardiac size is within normal limits. There are no signs of pulmonary edema or focal pulmonary consolidation. Increase in AP diameter of chest suggests COPD. There is no pleural effusion or pneumothorax. There is decrease in height of multiple thoracic vertebral bodies with no significant interval change. There is previous vertebroplasty in 1 of the lower thoracic vertebral bodies. Tip of right IJ chest port is seen in superior vena cava. IMPRESSION: COPD. There are no signs of pulmonary edema or focal pulmonary consolidation. Electronically Signed   By: Ernie Avena M.D.   On: 09/07/2022 13:06    Medications: I have reviewed the patient's current medications.  Assessment/Plan: This is a very pleasant 77 years old white female with limited stage small cell lung cancer diagnosed in April 2024 and she is currently undergoing a course of systemic chemotherapy with carboplatin and etoposide concurrent with radiation.  She is  status post 3 cycles of the chemotherapy.  Repeat CT scan of the chest after cycle #2 showed almost complete response to treatment. The patient is admitted to the hospital with significant chemotherapy-induced pancytopenia. For the chemotherapy-induced neutropenia, I will start the patient on Granix 300 mcg subcutaneously daily until her absolute neutrophil count is over 1500 for 2  days. For the chemotherapy-induced anemia, will continue with PRBCs transfusion to keep her hemoglobin above 8.0. For the significant thrombocytopenia, we will continue with platelet transfusion to keep her platelets count over 20,000. I will delay the start of cycle #4 by at least 1 week to give her time for recovery and I may reduce her dose of the chemotherapy for the next cycle. The patient may continue with her current curative radiotherapy during her hospitalization as long as she is asymptomatic. Thank you for taking good care of Sheena [name], I will continue to follow-up the patient with you and assist in her management on as-needed basis.   LOS: 1 day    Sheena [name] 09/08/2022

## 2022-09-09 ENCOUNTER — Ambulatory Visit: Payer: 59

## 2022-09-09 ENCOUNTER — Other Ambulatory Visit: Payer: Self-pay

## 2022-09-09 DIAGNOSIS — D61818 Other pancytopenia: Secondary | ICD-10-CM | POA: Diagnosis not present

## 2022-09-09 LAB — BASIC METABOLIC PANEL
Anion gap: 7 (ref 5–15)
BUN: 25 mg/dL — ABNORMAL HIGH (ref 8–23)
CO2: 30 mmol/L (ref 22–32)
Calcium: 8.3 mg/dL — ABNORMAL LOW (ref 8.9–10.3)
Chloride: 100 mmol/L (ref 98–111)
Creatinine, Ser: 0.69 mg/dL (ref 0.44–1.00)
GFR, Estimated: >60 mL/min (ref >60)
Glucose, Bld: 101 mg/dL — ABNORMAL HIGH (ref 70–99)
Potassium: 3.5 mmol/L (ref 3.5–5.1)
Sodium: 137 mmol/L (ref 135–145)

## 2022-09-09 LAB — CBC WITH DIFFERENTIAL/PLATELET
Abs Immature Granulocytes: 0 10*3/uL (ref 0.00–0.07)
Band Neutrophils: 8 %
Basophils Absolute: 0 10*3/uL (ref 0.0–0.1)
Basophils Relative: 0 %
Eosinophils Absolute: 0 10*3/uL (ref 0.0–0.5)
Eosinophils Relative: 1 %
HCT: 26.5 % — ABNORMAL LOW (ref 36.0–46.0)
Hemoglobin: 8.8 g/dL — ABNORMAL LOW (ref 12.0–15.0)
Lymphocytes Relative: 11 %
Lymphs Abs: 0.5 10*3/uL — ABNORMAL LOW (ref 0.7–4.0)
MCH: 31.2 pg (ref 26.0–34.0)
MCHC: 33.2 g/dL (ref 30.0–36.0)
MCV: 94 fL (ref 80.0–100.0)
Metamyelocytes Relative: 1 %
Monocytes Absolute: 0.2 10*3/uL (ref 0.1–1.0)
Monocytes Relative: 5 %
Neutro Abs: 3.4 10*3/uL (ref 1.7–7.7)
Neutrophils Relative %: 74 %
Platelets: 7 10*3/uL — CL (ref 150–400)
RBC: 2.82 MIL/uL — ABNORMAL LOW (ref 3.87–5.11)
RDW: 14.3 % (ref 11.5–15.5)
WBC: 4.1 10*3/uL (ref 4.0–10.5)
nRBC: 0 % (ref 0.0–0.2)

## 2022-09-09 LAB — BPAM RBC
Blood Product Expiration Date: 202408072359
Blood Product Expiration Date: 202408102359
Unit Type and Rh: 600
Unit Type and Rh: 600

## 2022-09-09 LAB — BPAM PLATELET PHERESIS
Blood Product Expiration Date: 202407112359
Blood Product Expiration Date: 202407102359
Blood Product Expiration Date: 202407132359

## 2022-09-09 LAB — TYPE AND SCREEN
ABO/RH(D): A NEG
Unit division: 0

## 2022-09-09 LAB — PREPARE PLATELET PHERESIS: Unit division: 0

## 2022-09-09 MED ORDER — SODIUM CHLORIDE 0.9% IV SOLUTION: Freq: Once | INTRAVENOUS | Status: AC

## 2022-09-09 MED ORDER — FAMOTIDINE 20 MG PO TABS
20.0000 mg | ORAL_TABLET | Freq: Every day | ORAL | Status: DC | PRN
  Administered 2022-09-09 – 2022-09-14 (×6): 20 mg via ORAL
  Filled 2022-09-09 (×5): qty 1

## 2022-09-09 NOTE — Progress Notes (Signed)
PROGRESS NOTE    Sheena [name]  ZOX:[id number] DOB: 1945/09/30 DOA: 09/07/2022 PCP: [name], MD  Chief Complaint  Patient presents with   Weakness    Brief Narrative:   77 y.o. female with medical history significant for hypertension, small cell lung cancer on chemo and radiation being admitted to the hospital with severe symptomatic anemia as well as thrombocytopenia  Her last radiation was 7/5, and her last chemotherapy was [place] Dobratz week before that.  Found to be relatively hypotensive, and lab work revealed hemoglobin 4.7, WBC 1.1, platelets less than 5, negative troponin x 2, creatinine up to 1.23 from normal baseline. Potassium 3.0, magnesium 1.5. She was given IV magnesium, IV potassium, and orders have been placed for 2 unit blood transfusion and 1 unit of packed platelets   Assessment & Plan:   Principal Problem:   Pancytopenia (HCC)  Chemotherapy-induced pancytopenia -Dr. Arbutus Ped has seen the patient, started on Granix 300 mcg daily for neutropenia till absolute neutrophil count is over 1500 for 2 days (ANC 3.4 today) -S/p 2 units PRBC, hemoglobin is 8.8 today.  Transfuse to keep hemoglobin greater than 8.0 -platelets 7 today, transfuse 2 units platelets and follow - > transfuse for < 20,000    Small cell lung cancer -S/p 3 cycles of chemotherapy and radiation treatment -Cycle #4 of chemotherapy as outpatient per Dr. Arbutus Ped   Hypertension -Metoprolol and Norvasc were held -Patient developed rebound tachycardia with heart rate in 140s to 200s -Will start back low-dose of metoprolol, 25 mg p.o. twice daily   Chronic pain -Continue Ultram as needed   Hypokalemia -improved   Hypomagnesemia -improved     DVT prophylaxis: SCD Code Status: full Family Communication: none Disposition:   Status is: Inpatient Remains inpatient appropriate because: continued need for inpatient care, pancytopenia   Consultants:  oncology  Procedures:   none  Antimicrobials:  Anti-infectives (From admission, onward)    None       Subjective: No new complaints other than chronic reflux  Objective: Vitals:   09/09/22 1453 09/09/22 1455 09/09/22 1530 09/09/22 1556  BP: (!) 121/54 (!) 121/54 (!) 110/53 (!) 108/52  Pulse: 76   95  Resp: 20 20 20 18   Temp: 98.6 F (37 C) 98.6 F (37 C) 98.5 F (36.9 C) 98.2 F (36.8 C)  TempSrc: Oral Oral Oral Oral  SpO2: 97% 97% 99% 98%  Weight:      Height:        Intake/Output Summary (Last 24 hours) at 09/09/2022 1817 Last data filed at 09/09/2022 1800 Gross per 24 hour  Intake 1079.42 ml  Output --  Net 1079.42 ml   Filed Weights   09/07/22 1216  Weight: 47.2 kg    Examination:  General exam: Appears calm and comfortable  Respiratory system: unlabored Cardiovascular system: RRR Gastrointestinal system: Abdomen is nondistended, soft and nontender. Central nervous system: Alert and oriented. No focal neurological deficits. Extremities: no LEE   Data Reviewed: I have personally reviewed following labs and imaging studies  CBC: Recent Labs  Lab 09/07/22 1303 09/07/22 1426 09/08/22 0856 09/09/22 0842  WBC 1.0* 1.1* 1.5* 4.1  NEUTROABS 0.8* 0.9* 1.2* 3.4  HGB 5.0* 4.7* 8.7* 8.8*  HCT 15.3* 14.6* 25.1* 26.5*  MCV 93.3 94.8 90.6 94.0  PLT <5* <5* 24* 7*    Basic Metabolic Panel: Recent Labs  Lab 09/07/22 1303 09/07/22 1426 09/08/22 0856 09/09/22 0002  NA 133* 134* 137 137  K 2.9* 3.0* 2.7* 3.5  CL 88*  90* 94* 100  CO2 31 27 31 30   GLUCOSE 129* 152* 119* 101*  BUN 53* 51* 39* 25*  CREATININE 1.15* 1.23* 0.86 0.69  CALCIUM 8.4* 8.3* 8.2* 8.3*  MG 1.5*  --  2.1  --     GFR: Estimated Creatinine Clearance: 43.6 mL/min (by C-G formula based on SCr of 0.69 mg/dL).  Liver Function Tests: Recent Labs  Lab 09/07/22 1303  AST 9*  ALT 11  ALKPHOS 53  BILITOT 1.9*  PROT 6.7  ALBUMIN 3.3*    CBG: Recent Labs  Lab 09/07/22 1335  GLUCAP 115*      No results found for this or any previous visit (from the past 240 hour(s)).       Radiology Studies: No results found.      Scheduled Meds:  sodium chloride   Intravenous Once   Chlorhexidine Gluconate Cloth  6 each Topical Daily   metoprolol tartrate  25 mg Oral BID   sucralfate  1 g Oral TID WC & HS   Tbo-filgastrim (GRANIX) SQ  300 mcg Subcutaneous Daily   Continuous Infusions:   LOS: 2 days    Time spent: over 30 min    Lacretia Nicks, MD [facility]   To contact the attending provider between 7A-7P or the covering provider during after hours 7P-7A, please log into the web site [email or web address] and access using universal [place] password for that web site. If you do not have the password, please call the hospital operator.  09/09/2022, 6:17 PM

## 2022-09-10 ENCOUNTER — Other Ambulatory Visit: Payer: Self-pay

## 2022-09-10 ENCOUNTER — Encounter (HOSPITAL_COMMUNITY): Payer: Self-pay | Admitting: Internal Medicine

## 2022-09-10 ENCOUNTER — Ambulatory Visit
Admission: RE | Admit: 2022-09-10 | Discharge: 2022-09-10 | Disposition: A | Discharge status: Home / Self Care | Payer: 59 | Source: Ambulatory Visit | Attending: Radiation Oncology | Admitting: Radiation Oncology

## 2022-09-10 DIAGNOSIS — E876 Hypokalemia: Secondary | ICD-10-CM | POA: Insufficient documentation

## 2022-09-10 DIAGNOSIS — Z87891 Personal history of nicotine dependence: Secondary | ICD-10-CM | POA: Diagnosis not present

## 2022-09-10 DIAGNOSIS — I4891 Unspecified atrial fibrillation: Secondary | ICD-10-CM | POA: Diagnosis not present

## 2022-09-10 DIAGNOSIS — Z510 Encounter for antineoplastic radiation therapy: Secondary | ICD-10-CM | POA: Diagnosis not present

## 2022-09-10 DIAGNOSIS — D61818 Other pancytopenia: Secondary | ICD-10-CM | POA: Diagnosis not present

## 2022-09-10 DIAGNOSIS — I480 Paroxysmal atrial fibrillation: Secondary | ICD-10-CM | POA: Insufficient documentation

## 2022-09-10 DIAGNOSIS — C3432 Malignant neoplasm of lower lobe, left bronchus or lung: Secondary | ICD-10-CM | POA: Diagnosis not present

## 2022-09-10 LAB — RAD ONC ARIA SESSION SUMMARY
Course Elapsed Days: 37
Plan Fractions Treated to Date: 23
Plan Prescribed Dose Per Fraction: 2 Gy
Plan Total Fractions Prescribed: 30
Plan Total Prescribed Dose: 60 Gy
Reference Point Dosage Given to Date: 46 Gy
Reference Point Session Dosage Given: 2 Gy
Session Number: 23

## 2022-09-10 LAB — COMPREHENSIVE METABOLIC PANEL
ALT: 8 U/L (ref 0–44)
AST: 10 U/L — ABNORMAL LOW (ref 15–41)
Albumin: 3 g/dL — ABNORMAL LOW (ref 3.5–5.0)
Alkaline Phosphatase: 53 U/L (ref 38–126)
Anion gap: 9 (ref 5–15)
BUN: 13 mg/dL (ref 8–23)
CO2: 27 mmol/L (ref 22–32)
Calcium: 8.3 mg/dL — ABNORMAL LOW (ref 8.9–10.3)
Chloride: 97 mmol/L — ABNORMAL LOW (ref 98–111)
Creatinine, Ser: 0.67 mg/dL (ref 0.44–1.00)
GFR, Estimated: >60 mL/min (ref >60)
Glucose, Bld: 103 mg/dL — ABNORMAL HIGH (ref 70–99)
Potassium: 2.8 mmol/L — ABNORMAL LOW (ref 3.5–5.1)
Sodium: 133 mmol/L — ABNORMAL LOW (ref 135–145)
Total Bilirubin: 1 mg/dL (ref 0.3–1.2)
Total Protein: 5.8 g/dL — ABNORMAL LOW (ref 6.5–8.1)

## 2022-09-10 LAB — BPAM PLATELET PHERESIS
ISSUE DATE / TIME: 202407101356
ISSUE DATE / TIME: 202407101509
Unit Type and Rh: 6200
Unit Type and Rh: 7300

## 2022-09-10 LAB — CBC WITH DIFFERENTIAL/PLATELET
Abs Immature Granulocytes: 0.3 10*3/uL — ABNORMAL HIGH (ref 0.00–0.07)
Basophils Absolute: 0 10*3/uL (ref 0.0–0.1)
Basophils Relative: 1 %
Eosinophils Absolute: 0.1 10*3/uL (ref 0.0–0.5)
Eosinophils Relative: 2 %
HCT: 24.3 % — ABNORMAL LOW (ref 36.0–46.0)
Hemoglobin: 8 g/dL — ABNORMAL LOW (ref 12.0–15.0)
Immature Granulocytes: 8 %
Lymphocytes Relative: 6 %
Lymphs Abs: 0.2 10*3/uL — ABNORMAL LOW (ref 0.7–4.0)
MCH: 31.4 pg (ref 26.0–34.0)
MCHC: 32.9 g/dL (ref 30.0–36.0)
MCV: 95.3 fL (ref 80.0–100.0)
Monocytes Absolute: 0.4 10*3/uL (ref 0.1–1.0)
Monocytes Relative: 11 %
Neutro Abs: 2.7 10*3/uL (ref 1.7–7.7)
Neutrophils Relative %: 72 %
Platelets: 49 10*3/uL — ABNORMAL LOW (ref 150–400)
RBC: 2.55 MIL/uL — ABNORMAL LOW (ref 3.87–5.11)
RDW: 14.4 % (ref 11.5–15.5)
WBC: 3.8 10*3/uL — ABNORMAL LOW (ref 4.0–10.5)
nRBC: 0 % (ref 0.0–0.2)

## 2022-09-10 LAB — PREPARE PLATELET PHERESIS: Unit division: 0

## 2022-09-10 LAB — PHOSPHORUS: Phosphorus: 2.5 mg/dL (ref 2.5–4.6)

## 2022-09-10 LAB — MAGNESIUM: Magnesium: 1.4 mg/dL — ABNORMAL LOW (ref 1.7–2.4)

## 2022-09-10 MED ORDER — AMIODARONE HCL IN DEXTROSE 360-4.14 MG/200ML-% IV SOLN
30.0000 mg/h | INTRAVENOUS | Status: DC
  Administered 2022-09-11 – 2022-09-12 (×4): 30 mg/h via INTRAVENOUS
  Filled 2022-09-10 (×3): qty 200

## 2022-09-10 MED ORDER — AMIODARONE LOAD VIA INFUSION
150.0000 mg | Freq: Once | INTRAVENOUS | Status: DC
Start: 2022-09-10 — End: 2022-09-10

## 2022-09-10 MED ORDER — MAGNESIUM SULFATE 4 GM/100ML IV SOLN
4.0000 g | Freq: Once | INTRAVENOUS | Status: AC
  Administered 2022-09-10: 4 g via INTRAVENOUS
  Filled 2022-09-10: qty 100

## 2022-09-10 MED ORDER — AMIODARONE HCL IN DEXTROSE 360-4.14 MG/200ML-% IV SOLN
60.0000 mg/h | INTRAVENOUS | Status: AC
  Administered 2022-09-10 (×2): 60 mg/h via INTRAVENOUS
  Filled 2022-09-10 (×2): qty 200

## 2022-09-10 MED ORDER — AMIODARONE LOAD VIA INFUSION
150.0000 mg | Freq: Once | INTRAVENOUS | Status: AC
  Administered 2022-09-10: 150 mg via INTRAVENOUS
  Filled 2022-09-10: qty 83.34

## 2022-09-10 MED ORDER — AMIODARONE HCL IN DEXTROSE 360-4.14 MG/200ML-% IV SOLN
30.0000 mg/h | INTRAVENOUS | Status: DC
Start: 2022-09-10 — End: 2022-09-10

## 2022-09-10 MED ORDER — POTASSIUM CHLORIDE CRYS ER 20 MEQ PO TBCR
40.0000 meq | EXTENDED_RELEASE_TABLET | ORAL | Status: AC
  Administered 2022-09-10 (×3): 40 meq via ORAL
  Filled 2022-09-10 (×3): qty 2

## 2022-09-10 MED ORDER — AMIODARONE HCL IN DEXTROSE 360-4.14 MG/200ML-% IV SOLN
60.0000 mg/h | INTRAVENOUS | Status: DC
Start: 2022-09-10 — End: 2022-09-10

## 2022-09-10 NOTE — Progress Notes (Signed)
Transition of Care [facility]) - Inpatient Brief Assessment   Patient Details  Name: Sheena [name] MRN: [id number] Date of Birth: 11/08/1945  [facility]) CM/SW Contact:    Larrie Kass, LCSW Phone Number: 09/10/2022, 10:20 AM   Clinical Narrative: Transition of Care Department [facility]) has reviewed patient and no TOC needs have been identified at this time. We will continue to monitor patient advancement through interdisciplinary progression rounds. If new patient transition needs arise, please place a TOC consult.   Transition of Care Asessment: Insurance and Status: Insurance coverage has been reviewed Patient has primary care physician: Yes Home environment has been reviewed: yes   Prior/Current Home Services: No current home services Social Determinants of Health Reivew: SDOH reviewed no interventions necessary Readmission risk has been reviewed: Yes Transition of care needs: no transition of care needs at this time

## 2022-09-10 NOTE — Progress Notes (Signed)
Patient notified from Cardiology of Afib RVR from monitor in nursing station. Primary provider notified, see new orders. Patient asymptomatic, EKG performed and placed in chart. Will continue to monitor.

## 2022-09-10 NOTE — Consult Note (Signed)
CONSULTATION NOTE   Patient Name: Sheena [name] Date of Encounter: 09/10/2022 Cardiologist: None Electrophysiologist: None Advanced Heart Failure: None   Chief Complaint   Palpitations  Patient Profile   77 yo female with lung cancer, pancytopenia, undergoing chemotherapy and XRT, noted to be in afib with RVR today  HPI   Sheena [name] is a 77 y.o. female who is being seen today for the evaluation of afib with RVR at the request of Dr. Lowell Guitar. This is a 77 year old female who unfortunately has small cell lung cancer on chemotherapy and radiation as well as hypertension and history of murmur in the past, evaluated by Dr. Lorne Skeens in 2014 and found to have mild aortic insufficiency and mild mitral regurgitation on echo.  She was admitted after feeling weak and short of breath.  She is also noted palpitations.  She felt like her heart was pounding out of her head for the past several days.  She was brought in by EMS and was found to be hypotensive and pancytopenic with a white count of 110, platelets less than 5 and elevated creatinine of 1.23.  She was also hypokalemic at 3.0 and hypomagnesemic at 1.5 and this has been repleted, however she has persistent hypokalemia with potassium of 2.8 today.  And magnesium again dropped to 1.4.  Today she was noted to go into A-fib with RVR with heart rates in the 160s to 180s.  Cardiology is asked to recommend therapy.  Her metoprolol was held today due to hypotension and this has been an issue during this hospitalization.  PMHx   Past Medical History:  Diagnosis Date   Anxiety    Heart murmur    Hypertension    Scarlet fever with other complications childhood   "had to learn to work again" has had leg weakness    Past Surgical History:  Procedure Laterality Date   BRONCHIAL NEEDLE ASPIRATION BIOPSY  06/30/2022   Procedure: BRONCHIAL NEEDLE ASPIRATION BIOPSIES;  Surgeon: [name], DO;  Location: [facility];  Service:  Pulmonary;;   IR IMAGING GUIDED PORT INSERTION  07/31/2022   KYPHOPLASTY     2014   peridontal surgery     VIDEO BRONCHOSCOPY WITH ENDOBRONCHIAL ULTRASOUND N/A 06/30/2022   Procedure: VIDEO BRONCHOSCOPY WITH ENDOBRONCHIAL ULTRASOUND;  Surgeon: [name], DO;  Location: [facility];  Service: Pulmonary;  Laterality: N/A;    FAMHx   Family History  Problem Relation Age of Onset   Cancer Mother        breast, spine mets   Hypertension Mother    Heart disease Mother 59       CABG 4 vessel   COPD Father        emphysema   Heart disease Brother 83       MI    SOCHx    reports that she quit smoking about 5 years ago. Her smoking use included cigarettes. She started smoking about 50 years ago. She has a 45 pack-year smoking history. She has never used smokeless tobacco. She reports that she does not drink alcohol and does not use drugs.  Outpatient Medications   No current facility-administered medications on file prior to encounter.   Current Outpatient Medications on File Prior to Encounter  Medication Sig Dispense Refill   acetaminophen (TYLENOL) 500 MG tablet Take 1,000 mg by mouth 2 (two) times daily as needed for mild pain.     amLODipine (NORVASC) 5 MG tablet TAKE 1 TABLET (5 MG TOTAL)  BY MOUTH DAILY. (Patient taking differently: Take 5 mg by mouth at bedtime.) 90 tablet 3   aspirin EC 81 MG tablet Take 81 mg by mouth 2 (two) times a week. Monday and Friday     atorvastatin (LIPITOR) 20 MG tablet TAKE 1 TABLET BY MOUTH EVERY DAY (Patient taking differently: Take 20 mg by mouth at bedtime.) 90 tablet 3   Calcium Citrate-Vitamin D (CALCIUM CITRATE PETITE/VIT D PO) Take 1 tablet by mouth 2 (two) times daily.     cyclobenzaprine (FLEXERIL) 5 MG tablet TAKE 1 TABLET BY MOUTH TWICE A DAY AS NEEDED FOR MUSCLE SPASM (Patient taking differently: Take 5 mg by mouth as needed for muscle spasms.) 30 tablet 1   famotidine (PEPCID) 20 MG tablet Take 20 mg by mouth daily.      furosemide (LASIX) 40 MG tablet TAKE 1 TABLET BY MOUTH EVERY DAY 90 tablet 1   lidocaine-prilocaine (EMLA) cream Apply 1 Application topically as needed. (Patient taking differently: Apply 1 Application topically as needed (port access).) 30 g 2   metoprolol tartrate (LOPRESSOR) 50 MG tablet TAKE 1 TABLET BY MOUTH TWICE A DAY 180 tablet 1   PRESCRIPTION MEDICATION Apply 1 application  topically in the morning and at bedtime. Unknown cream given to Pt from the cancer center for after radiation     prochlorperazine (COMPAZINE) 10 MG tablet Take 1 tablet (10 mg total) by mouth every 6 (six) hours as needed for nausea or vomiting. 30 tablet 2   sucralfate (CARAFATE) 1 GM/10ML suspension Take 10 mLs (1 g total) by mouth 4 (four) times daily -  with meals and at bedtime. (Patient taking differently: Take 1 g by mouth 2 (two) times daily.) 420 mL 1   traMADol (ULTRAM) 50 MG tablet Take 0.5-1 tablets (25-50 mg total) by mouth 2 (two) times daily as needed. For chronic low back pain. May refill monthly (Patient taking differently: Take 25 mg by mouth daily as needed for moderate pain or severe pain.) 60 tablet 2    Inpatient Medications    Scheduled Meds:  sodium chloride   Intravenous Once   amiodarone  150 mg Intravenous Once   Chlorhexidine Gluconate Cloth  6 each Topical Daily   metoprolol tartrate  25 mg Oral BID   potassium chloride  40 mEq Oral Q4H   sucralfate  1 g Oral TID WC & HS    Continuous Infusions:  amiodarone     Followed by   amiodarone      PRN Meds: acetaminophen **OR** acetaminophen, albuterol, famotidine, ondansetron **OR** ondansetron (ZOFRAN) IV, senna-docusate, sodium chloride flush, traMADol, traZODone   ALLERGIES   Allergies  Allergen Reactions   Antihistamines, Diphenhydramine-Type Other (See Comments)    palpitations, feel jittery   Aspirin Other (See Comments)    Stomach cramps   Evista [Raloxifene] Other (See Comments)    Hot Flashes     ROS    Pertinent items noted in HPI and remainder of comprehensive ROS otherwise negative.  Vitals   Vitals:   09/09/22 2015 09/10/22 0524 09/10/22 1131 09/10/22 1452  BP: (!) 113/59 (!) 111/54 (!) 110/57 94/64  Pulse: 92 87 97 88  Resp: 18 19  18   Temp: 98.7 F (37.1 C) 98.2 F (36.8 C) 98.7 F (37.1 C) 98 F (36.7 C)  TempSrc: Oral Oral Oral Oral  SpO2: 97% 96% 98% 97%  Weight:      Height:        Intake/Output Summary (Last 24 hours)  at 09/10/2022 1458 Last data filed at 09/09/2022 1800 Gross per 24 hour  Intake 582.75 ml  Output --  Net 582.75 ml   Filed Weights   09/07/22 1216  Weight: 47.2 kg    Physical Exam   General appearance: alert, no distress, pale, and noted to have total hair loss Neck: no carotid bruit, no JVD, and thyroid not enlarged, symmetric, no tenderness/mass/nodules Lungs: clear to auscultation bilaterally Heart: irregularly irregular rhythm and systolic murmur: early systolic 2/6, blowing at apex Abdomen: soft, non-tender; bowel sounds normal; no masses,  no organomegaly Extremities: extremities normal, atraumatic, no cyanosis or edema Pulses: 2+ and symmetric Skin: Pale, warm, dry Neurologic: Grossly normal Psych: Pleasant  Labs   Results for orders placed or performed during the hospital encounter of 09/07/22 (from the past 48 hour(s))  Basic metabolic panel     Status: Abnormal   Collection Time: 09/09/22 12:02 AM  Result Value Ref Range   Sodium 137 135 - 145 mmol/L   Potassium 3.5 3.5 - 5.1 mmol/L   Chloride 100 98 - 111 mmol/L   CO2 30 22 - 32 mmol/L   Glucose, Bld 101 (H) 70 - 99 mg/dL    Comment: Glucose reference range applies only to samples taken after fasting for at least 8 hours.   BUN 25 (H) 8 - 23 mg/dL   Creatinine, Ser 1.61 0.44 - 1.00 mg/dL   Calcium 8.3 (L) 8.9 - 10.3 mg/dL   GFR, Estimated >09 >60 mL/min    Comment: (NOTE) Calculated using the CKD-EPI Creatinine Equation (2021)    Anion gap 7 5 - 15    Comment:  Performed at [facility], [address] [place], [place] [address]  CBC with Differential/Platelet     Status: Abnormal   Collection Time: 09/09/22  8:42 AM  Result Value Ref Range   WBC 4.1 4.0 - 10.5 K/uL   RBC 2.82 (L) 3.87 - 5.11 MIL/uL   Hemoglobin 8.8 (L) 12.0 - 15.0 g/dL   HCT 81.1 (L) 91.4 - 78.2 %   MCV 94.0 80.0 - 100.0 fL   MCH 31.2 26.0 - 34.0 pg   MCHC 33.2 30.0 - 36.0 g/dL   RDW 95.6 21.3 - 08.6 %   Platelets 7 (LL) 150 - 400 K/uL    Comment: Immature Platelet Fraction may be clinically indicated, consider ordering this additional test [id number] CRITICAL VALUE NOTED.  VALUE IS CONSISTENT WITH PREVIOUSLY REPORTED AND CALLED VALUE. REPEATED TO VERIFY PLATELET COUNT CONFIRMED BY SMEAR    nRBC 0.0 0.0 - 0.2 %   Neutrophils Relative % 74 %   Neutro Abs 3.4 1.7 - 7.7 K/uL   Band Neutrophils 8 %   Lymphocytes Relative 11 %   Lymphs Abs 0.5 (L) 0.7 - 4.0 K/uL   Monocytes Relative 5 %   Monocytes Absolute 0.2 0.1 - 1.0 K/uL   Eosinophils Relative 1 %   Eosinophils Absolute 0.0 0.0 - 0.5 K/uL   Basophils Relative 0 %   Basophils Absolute 0.0 0.0 - 0.1 K/uL   WBC Morphology DOHLE BODIES     Comment: TOXIC GRANULATION   Metamyelocytes Relative 1 %   Abs Immature Granulocytes 0.00 0.00 - 0.07 K/uL    Comment: Performed at [facility], [address] [place], [place] [address]  Prepare platelet pheresis     Status: None   Collection Time: 09/09/22  1:41 PM  Result Value Ref Range   Unit Number [id number]  Blood Component Type PLTP1 PSORALEN TREATED    Unit division 00    Status of Unit ISSUED,FINAL    Transfusion Status OK TO TRANSFUSE    Unit Number [id number]    Blood Component Type PLTP2 PSORALEN TREATED    Unit division 00    Status of Unit ISSUED,FINAL    Transfusion Status      OK TO TRANSFUSE Performed at [facility], [address] [place], [place] [address]   CBC with  Differential/Platelet     Status: Abnormal   Collection Time: 09/10/22 10:37 AM  Result Value Ref Range   WBC 3.8 (L) 4.0 - 10.5 K/uL   RBC 2.55 (L) 3.87 - 5.11 MIL/uL   Hemoglobin 8.0 (L) 12.0 - 15.0 g/dL   HCT 78.2 (L) 95.6 - 21.3 %   MCV 95.3 80.0 - 100.0 fL   MCH 31.4 26.0 - 34.0 pg   MCHC 32.9 30.0 - 36.0 g/dL   RDW 08.6 57.8 - 46.9 %   Platelets 49 (L) 150 - 400 K/uL    Comment: Immature Platelet Fraction may be clinically indicated, consider ordering this additional test [id number] REPEATED TO VERIFY PLATELET COUNT CONFIRMED BY SMEAR    nRBC 0.0 0.0 - 0.2 %   Neutrophils Relative % 72 %   Neutro Abs 2.7 1.7 - 7.7 K/uL   Lymphocytes Relative 6 %   Lymphs Abs 0.2 (L) 0.7 - 4.0 K/uL   Monocytes Relative 11 %   Monocytes Absolute 0.4 0.1 - 1.0 K/uL   Eosinophils Relative 2 %   Eosinophils Absolute 0.1 0.0 - 0.5 K/uL   Basophils Relative 1 %   Basophils Absolute 0.0 0.0 - 0.1 K/uL   WBC Morphology DOHLE BODIES     Comment: TOXIC GRANULATION   Immature Granulocytes 8 %   Abs Immature Granulocytes 0.30 (H) 0.00 - 0.07 K/uL    Comment: Performed at [facility], [address] [place], [place] [address]  Comprehensive metabolic panel     Status: Abnormal   Collection Time: 09/10/22 10:37 AM  Result Value Ref Range   Sodium 133 (L) 135 - 145 mmol/L   Potassium 2.8 (L) 3.5 - 5.1 mmol/L   Chloride 97 (L) 98 - 111 mmol/L   CO2 27 22 - 32 mmol/L   Glucose, Bld 103 (H) 70 - 99 mg/dL    Comment: Glucose reference range applies only to samples taken after fasting for at least 8 hours.   BUN 13 8 - 23 mg/dL   Creatinine, Ser 1.02 0.44 - 1.00 mg/dL   Calcium 8.3 (L) 8.9 - 10.3 mg/dL   Total Protein 5.8 (L) 6.5 - 8.1 g/dL   Albumin 3.0 (L) 3.5 - 5.0 g/dL   AST 10 (L) 15 - 41 U/L   ALT 8 0 - 44 U/L   Alkaline Phosphatase 53 38 - 126 U/L   Total Bilirubin 1.0 0.3 - 1.2 mg/dL   GFR, Estimated >72 >53 mL/min    Comment: (NOTE) Calculated using the CKD-EPI  Creatinine Equation (2021)    Anion gap 9 5 - 15    Comment: Performed at [facility], [address], [place], [place] [address]  Magnesium     Status: Abnormal   Collection Time: 09/10/22 10:37 AM  Result Value Ref Range   Magnesium 1.4 (L) 1.7 - 2.4 mg/dL    Comment: Performed at [facility], [address] [place], [place] [address]  Phosphorus  Status: None   Collection Time: 09/10/22 10:37 AM  Result Value Ref Range   Phosphorus 2.5 2.5 - 4.6 mg/dL    Comment: Performed at [facility], [address] [place], [place] [address]    ECG   A-fib with rapid ventricular response at 159- Personally Reviewed  Telemetry   A-fib with RVR rates 160-180s- Personally Reviewed  Radiology   No results found.  Cardiac Studies   N/A  Impression   Principal Problem:   Pancytopenia (HCC) Active Problems:   MURMUR   Primary small cell carcinoma of lower lobe of left lung (HCC)   New onset atrial fibrillation (HCC)   Hypokalemia   Hypomagnesemia   Recommendation   New onset A-fib with RVR -rates have been quite fast which are compounded by low systolic blood pressure.  I recommended starting IV amiodarone for rate control.  In addition she is noted to have recurrent hypokalemia and hypomagnesemia.  Would continue to aggressively replete to keep potassium greater than 4.0 and magnesium greater than 2.0.  Would repeat an echocardiogram tomorrow due to persistent hypotension and recurrent tachycardia, there is concern for possible development of systolic heart failure.  Given her pancytopenia with platelet counts that are improving but still low, would not recommend systemic anticoagulation at this time.  Her CHA2DS2-VASc score is 3 for age and hypertension.  Could probably hold her metoprolol in the setting of amiodarone therapy to allow more blood pressure room.  Thanks for the consultation.  Cardiology will follow with  you.  Time Spent Directly with Patient:  I have spent a total of 45 minutes with the patient reviewing hospital notes, telemetry, EKGs, labs and examining the patient as well as establishing an assessment and plan that was discussed personally with the patient.  > 50% of time was spent in direct patient care.  Length of Stay:  LOS: 3 days   Chrystie Nose, MD, [facility], FACP  [place]  [facility]  Medical Director of the Advanced Lipid Disorders &  Cardiovascular Risk Reduction Clinic Diplomate of the American Board of Clinical Lipidology Attending Cardiologist  Direct Dial: [phone]  Fax: [phone]  Website:  [email or web address] 09/10/2022, 2:58 PM

## 2022-09-10 NOTE — Progress Notes (Signed)
PROGRESS NOTE    Elvis Boot  ZOX:[id number] DOB: 10/29/1945 DOA: 09/07/2022 PCP: [name], MD  Chief Complaint  Patient presents with   Weakness    Brief Narrative:   77 y.o. female with medical history significant for hypertension, small cell lung cancer on chemo and radiation being admitted to the hospital with severe symptomatic anemia as well as thrombocytopenia  Her last radiation was 7/5, and her last chemotherapy was [facility] Dalpe week before that.  Found to be relatively hypotensive, and lab work revealed hemoglobin 4.7, WBC 1.1, platelets less than 5, negative troponin x 2, creatinine up to 1.23 from normal baseline. Potassium 3.0, magnesium 1.5. She was given IV magnesium, IV potassium, and orders have been placed for 2 unit blood transfusion and 1 unit of packed platelets   Assessment & Plan:   Principal Problem:   Pancytopenia (HCC)  Atrial Fibrillation with RVR - BP on metop soft, will start with amiodarone - echo, EKG - continue telemetry monitoring  - appreciate Dr. Blanchie Dessert assist  Chemotherapy-induced pancytopenia -Dr. Arbutus Ped has seen the patient, started on Granix 300 mcg daily for neutropenia till absolute neutrophil count is over 1500 for 2 days (ANC 2.7 today, hold further granix) -S/p 2 units PRBC, hemoglobin is 8 today.  Transfuse to keep hemoglobin greater than 8.0 (trend). -platelets improved after transfusion 7/10 - > transfuse for < 20,000    Small cell lung cancer -S/p 3 cycles of chemotherapy and radiation treatment -Cycle #4 of chemotherapy as outpatient per Dr. Arbutus Ped   Hypertension -metop -norvasc on hold   Chronic pain -Continue Ultram as needed   Hypokalemia -improved   Hypomagnesemia -improved     DVT prophylaxis: SCD Code Status: full Family Communication: none Disposition:   Status is: Inpatient Remains inpatient appropriate because: continued need for inpatient care, pancytopenia   Consultants:   oncology  Procedures:  none  Antimicrobials:  Anti-infectives (From admission, onward)    None       Subjective: No new complaints Daughter at bedside  Objective: Vitals:   09/09/22 1556 09/09/22 2015 09/10/22 0524 09/10/22 1131  BP: (!) 108/52 (!) 113/59 (!) 111/54 (!) 110/57  Pulse: 95 92 87 97  Resp: 18 18 19    Temp: 98.2 F (36.8 C) 98.7 F (37.1 C) 98.2 F (36.8 C) 98.7 F (37.1 C)  TempSrc: Oral Oral Oral Oral  SpO2: 98% 97% 96% 98%  Weight:      Height:        Intake/Output Summary (Last 24 hours) at 09/10/2022 1446 Last data filed at 09/09/2022 1800 Gross per 24 hour  Intake 1079.42 ml  Output --  Net 1079.42 ml   Filed Weights   09/07/22 1216  Weight: 47.2 kg    Examination:  General: No acute distress. Cardiovascular: irreuglarly irregular, RVR Lungs: unlabored Neurological: Alert and oriented 3. Moves all extremities 4 with equal strength. Cranial nerves II through XII grossly intact. Extremities: No clubbing or cyanosis. No edema.  Data Reviewed: I have personally reviewed following labs and imaging studies  CBC: Recent Labs  Lab 09/07/22 1303 09/07/22 1426 09/08/22 0856 09/09/22 0842 09/10/22 1037  WBC 1.0* 1.1* 1.5* 4.1 3.8*  NEUTROABS 0.8* 0.9* 1.2* 3.4 2.7  HGB 5.0* 4.7* 8.7* 8.8* 8.0*  HCT 15.3* 14.6* 25.1* 26.5* 24.3*  MCV 93.3 94.8 90.6 94.0 95.3  PLT <5* <5* 24* 7* 49*    Basic Metabolic Panel: Recent Labs  Lab 09/07/22 1303 09/07/22 1426 09/08/22 0856 09/09/22 0002 09/10/22 1037  NA 133* 134* 137 137 133*  K 2.9* 3.0* 2.7* 3.5 2.8*  CL 88* 90* 94* 100 97*  CO2 31 27 31 30 27   GLUCOSE 129* 152* 119* 101* 103*  BUN 53* 51* 39* 25* 13  CREATININE 1.15* 1.23* 0.86 0.69 0.67  CALCIUM 8.4* 8.3* 8.2* 8.3* 8.3*  MG 1.5*  --  2.1  --  1.4*  PHOS  --   --   --   --  2.5    GFR: Estimated Creatinine Clearance: 43.6 mL/min (by C-G formula based on SCr of 0.67 mg/dL).  Liver Function Tests: Recent Labs  Lab  09/07/22 1303 09/10/22 1037  AST 9* 10*  ALT 11 8  ALKPHOS 53 53  BILITOT 1.9* 1.0  PROT 6.7 5.8*  ALBUMIN 3.3* 3.0*    CBG: Recent Labs  Lab 09/07/22 1335  GLUCAP 115*     No results found for this or any previous visit (from the past 240 hour(s)).       Radiology Studies: No results found.      Scheduled Meds:  sodium chloride   Intravenous Once   amiodarone  150 mg Intravenous Once   Chlorhexidine Gluconate Cloth  6 each Topical Daily   metoprolol tartrate  25 mg Oral BID   potassium chloride  40 mEq Oral Q4H   sucralfate  1 g Oral TID WC & HS   Continuous Infusions:  amiodarone     Followed by   amiodarone       LOS: 3 days    Time spent: over 30 min    Lacretia Nicks, MD [facility]   To contact the attending provider between 7A-7P or the covering provider during after hours 7P-7A, please log into the web site [email or web address] and access using universal [place] password for that web site. If you do not have the password, please call the hospital operator.  09/10/2022, 2:46 PM

## 2022-09-10 NOTE — Progress Notes (Signed)
OT Cancellation Note  Patient Details Name: Sheena [name] MRN: [id number] DOB: Jan 27, 1946   Cancelled Treatment:    Reason Eval/Treat Not Completed: Patient at procedure or test/ unavailable- Transportation in room preparring to take pt to Radiation treatment. Pt reports a planned nap afterwards and agrees for OT to try tomorrow.  Theodoro Clock 09/10/2022, 2:17 PM

## 2022-09-11 ENCOUNTER — Ambulatory Visit: Payer: 59

## 2022-09-11 ENCOUNTER — Inpatient Hospital Stay (HOSPITAL_COMMUNITY): Payer: 59

## 2022-09-11 DIAGNOSIS — I4891 Unspecified atrial fibrillation: Secondary | ICD-10-CM | POA: Diagnosis not present

## 2022-09-11 DIAGNOSIS — D61818 Other pancytopenia: Secondary | ICD-10-CM | POA: Diagnosis not present

## 2022-09-11 LAB — TYPE AND SCREEN: ABO/RH(D): A NEG

## 2022-09-11 LAB — CBC WITH DIFFERENTIAL/PLATELET
Abs Immature Granulocytes: 0.44 10*3/uL — ABNORMAL HIGH (ref 0.00–0.07)
Basophils Absolute: 0.1 10*3/uL (ref 0.0–0.1)
Basophils Relative: 1 %
Eosinophils Absolute: 0 10*3/uL (ref 0.0–0.5)
Eosinophils Relative: 1 %
HCT: 22.7 % — ABNORMAL LOW (ref 36.0–46.0)
Hemoglobin: 7.3 g/dL — ABNORMAL LOW (ref 12.0–15.0)
Immature Granulocytes: 10 %
Lymphocytes Relative: 6 %
Lymphs Abs: 0.3 10*3/uL — ABNORMAL LOW (ref 0.7–4.0)
MCH: 30.9 pg (ref 26.0–34.0)
MCHC: 32.2 g/dL (ref 30.0–36.0)
MCV: 96.2 fL (ref 80.0–100.0)
Monocytes Absolute: 0.6 10*3/uL (ref 0.1–1.0)
Monocytes Relative: 14 %
Neutro Abs: 3.1 10*3/uL (ref 1.7–7.7)
Neutrophils Relative %: 68 %
Platelets: 24 10*3/uL — CL (ref 150–400)
RBC: 2.36 MIL/uL — ABNORMAL LOW (ref 3.87–5.11)
RDW: 14.4 % (ref 11.5–15.5)
WBC: 4.5 10*3/uL (ref 4.0–10.5)
nRBC: 0 % (ref 0.0–0.2)

## 2022-09-11 LAB — BASIC METABOLIC PANEL
Anion gap: 5 (ref 5–15)
BUN: 12 mg/dL (ref 8–23)
CO2: 26 mmol/L (ref 22–32)
Calcium: 8.5 mg/dL — ABNORMAL LOW (ref 8.9–10.3)
Chloride: 102 mmol/L (ref 98–111)
Creatinine, Ser: 0.66 mg/dL (ref 0.44–1.00)
GFR, Estimated: >60 mL/min (ref >60)
Glucose, Bld: 99 mg/dL (ref 70–99)
Potassium: 5.1 mmol/L (ref 3.5–5.1)
Sodium: 133 mmol/L — ABNORMAL LOW (ref 135–145)

## 2022-09-11 LAB — PREPARE PLATELET PHERESIS: Unit division: 0

## 2022-09-11 LAB — ECHOCARDIOGRAM COMPLETE
AR max vel: 1.56 cm2
AV Area VTI: 1.7 cm2
AV Area mean vel: 1.58 cm2
AV Mean grad: 6 mmHg
AV Peak grad: 11.7 mmHg
Ao pk vel: 1.71 m/s
Area-P 1/2: 4.83 cm2
Height: 60 in
P 1/2 time: 309 msec
S' Lateral: 2.3 cm
Weight: 1664 oz

## 2022-09-11 LAB — BPAM PLATELET PHERESIS

## 2022-09-11 LAB — MAGNESIUM: Magnesium: 2 mg/dL (ref 1.7–2.4)

## 2022-09-11 LAB — PREPARE RBC (CROSSMATCH)

## 2022-09-11 LAB — BPAM RBC: ISSUE DATE / TIME: 202407121145

## 2022-09-11 MED ORDER — SODIUM CHLORIDE 0.9% IV SOLUTION: Freq: Once | INTRAVENOUS | Status: AC

## 2022-09-11 MED ORDER — METOPROLOL TARTRATE 25 MG PO TABS
12.5000 mg | ORAL_TABLET | Freq: Two times a day (BID) | ORAL | Status: DC
  Administered 2022-09-11 – 2022-09-14 (×6): 12.5 mg via ORAL
  Filled 2022-09-11 (×6): qty 1

## 2022-09-11 NOTE — Care Management Important Message (Signed)
Important Message  Patient Details IM Letter given. Name: Sheena [name] MRN: [id number] Date of Birth: 1945/05/06   Medicare Important Message Given:  Yes     Caren Macadam 09/11/2022, 12:29 PM

## 2022-09-11 NOTE — Progress Notes (Signed)
CHART NOTE The patient continues to have chemotherapy-induced anemia and thrombocytopenia which is expected after her treatment for small cell lung cancer with carboplatin and etoposide. I would recommend for her to receive at least 1 unit of PRBCs and 1 unit platelet transfusion today for discharge.  We will arrange for the patient to have repeat blood work early next week and consider for outpatient transfusion if needed. Her total white blood count recovered which is very important. I am out of the office until Monday.  If you have any question please reach out to the on-call physician.

## 2022-09-11 NOTE — Progress Notes (Signed)
*  PRELIMINARY RESULTS* Echocardiogram 2D Echocardiogram has been performed.  Sheena [name] 09/11/2022, 1:51 PM

## 2022-09-11 NOTE — Progress Notes (Addendum)
PT Cancellation Note  Patient Details Name: Sheena [name] MRN: [id number] DOB: [birth year]   Cancelled Treatment:    Reason Eval/Treat Not Completed: Patient at procedure or test/unavailable  11:23 RN reports pt to receive platelets and difficult "stick", requests limited movement during transfusion.  Pt also receiving IV amiodarone.  Will attempt to check back tomorrow for PT evaluation.   Janan Halter [place] 09/11/2022, 10:35 AM Paulino Door, DPT Physical Therapist Acute Rehabilitation Services Office: [phone]

## 2022-09-11 NOTE — Progress Notes (Signed)
PROGRESS NOTE    Sheena [name]  OZH:[id number] DOB: 06/30/1945 DOA: 09/07/2022 PCP: [name], MD  Chief Complaint  Patient presents with   Weakness    Brief Narrative:   77 y.o. female with medical history significant for hypertension, small cell lung cancer on chemo and radiation being admitted to the hospital with severe symptomatic anemia as well as thrombocytopenia  Her last radiation was 7/5, and her last chemotherapy was Birttany Dechellis week before that.  Found to be relatively hypotensive, and lab work revealed hemoglobin 4.7, WBC 1.1, platelets less than 5, negative troponin x 2, creatinine up to 1.23 from normal baseline. Potassium 3.0, magnesium 1.5. She was given IV magnesium, IV potassium, and orders have been placed for 2 unit blood transfusion and 1 unit of packed platelets   Assessment & Plan:   Principal Problem:   Pancytopenia (HCC) Active Problems:   MURMUR   Primary small cell carcinoma of lower lobe of left lung (HCC)   New onset atrial fibrillation (HCC)   Hypokalemia   Hypomagnesemia  Atrial Fibrillation with RVR - appreciate cardiology recs -- continuing amio another 24 hrs - echo (EF 60-65%, no RWMA, EKG (fib with RVR) - continue telemetry monitoring  - appreciate Dr. Blanchie Dessert assist  Chemotherapy-induced pancytopenia -Dr. Arbutus Ped has seen the patient, started on Granix 300 mcg daily for neutropenia till absolute neutrophil count is over 1500 for 2 days (ANC 3.1 today, hold further granix) -Additional unit pRBC ordered for today.  Transfuse to keep hemoglobin greater than 8.0 (trend). -additional unit of platelets ordered per hematology- > transfuse for < 20,000    Small cell lung cancer -S/p 3 cycles of chemotherapy and radiation treatment -Cycle #4 of chemotherapy as outpatient per Dr. Arbutus Ped   Hypertension -metop -norvasc on hold   Chronic pain -Continue Ultram as needed   Hypokalemia -improved   Hypomagnesemia -improved     DVT  prophylaxis: SCD Code Status: full Family Communication: none Disposition:   Status is: Inpatient Remains inpatient appropriate because: continued need for inpatient care, pancytopenia   Consultants:  oncology  Procedures:  none  Antimicrobials:  Anti-infectives (From admission, onward)    None       Subjective: No new complaints Son and husband at bedside  Objective: Vitals:   09/11/22 0514 09/11/22 0909 09/11/22 1154 09/11/22 1215  BP: (!) 104/52 (!) 117/50 (!) 117/49 (!) 114/48  Pulse: 93 99 (!) 103 95  Resp: 19 (!) 24 20 18   Temp: 98.4 F (36.9 C) 98.5 F (36.9 C) 98.4 F (36.9 C) 98.6 F (37 C)  TempSrc: Oral Oral Oral Oral  SpO2: 98% 100% 99% 98%  Weight:      Height:        Intake/Output Summary (Last 24 hours) at 09/11/2022 1506 Last data filed at 09/11/2022 0403 Gross per 24 hour  Intake 736.81 ml  Output --  Net 736.81 ml   Filed Weights   09/07/22 1216  Weight: 47.2 kg    Examination:  General: No acute distress. Cardiovascular: tachycardic, regular Lungs: unlabored Abdomen: Soft, nontender, nondistended Neurological: Alert and oriented 3. Moves all extremities 4 with equal strength. Cranial nerves II through XII grossly intact. Extremities: No clubbing or cyanosis. No edema.   Data Reviewed: I have personally reviewed following labs and imaging studies  CBC: Recent Labs  Lab 09/07/22 1426 09/08/22 0856 09/09/22 0842 09/10/22 1037 09/11/22 0418  WBC 1.1* 1.5* 4.1 3.8* 4.5  NEUTROABS 0.9* 1.2* 3.4 2.7 3.1  HGB 4.7*  8.7* 8.8* 8.0* 7.3*  HCT 14.6* 25.1* 26.5* 24.3* 22.7*  MCV 94.8 90.6 94.0 95.3 96.2  PLT <5* 24* 7* 49* 24*    Basic Metabolic Panel: Recent Labs  Lab 09/07/22 1303 09/07/22 1426 09/08/22 0856 09/09/22 0002 09/10/22 1037 09/11/22 0418  NA 133* 134* 137 137 133* 133*  K 2.9* 3.0* 2.7* 3.5 2.8* 5.1  CL 88* 90* 94* 100 97* 102  CO2 31 27 31 30 27 26   GLUCOSE 129* 152* 119* 101* 103* 99  BUN 53* 51* 39*  25* 13 12  CREATININE 1.15* 1.23* 0.86 0.69 0.67 0.66  CALCIUM 8.4* 8.3* 8.2* 8.3* 8.3* 8.5*  MG 1.5*  --  2.1  --  1.4* 2.0  PHOS  --   --   --   --  2.5  --     GFR: Estimated Creatinine Clearance: 43.6 mL/min (by C-G formula based on SCr of 0.66 mg/dL).  Liver Function Tests: Recent Labs  Lab 09/07/22 1303 09/10/22 1037  AST 9* 10*  ALT 11 8  ALKPHOS 53 53  BILITOT 1.9* 1.0  PROT 6.7 5.8*  ALBUMIN 3.3* 3.0*    CBG: Recent Labs  Lab 09/07/22 1335  GLUCAP 115*     No results found for this or any previous visit (from the past 240 hour(s)).       Radiology Studies: ECHOCARDIOGRAM COMPLETE  Result Date: 09/11/2022    ECHOCARDIOGRAM REPORT   Patient Name:   Sheena [name] Date of Exam: 09/11/2022 Medical Rec #:  [id number]      Height:       60.0 in Accession #:    [id number]     Weight:       104.0 lb Date of Birth:  Jul 19, 1945      BSA:          1.414 m Patient Age:    77 years       BP:           114/48 mmHg Patient Gender: F              HR:           103 bpm. Exam Location:  Inpatient Procedure: 2D Echo, Cardiac Doppler, Color Doppler and Strain Analysis Indications:    [name]-Fib I48.91  History:        Patient has prior history of Echocardiogram examinations, most                 recent 11/13/2012. Chemo therapy, Arrythmias:Atrial Fibrillation;                 Risk Factors:Hypertension, Dyslipidemia and Former Smoker.  Sonographer:    Dondra Prader RVT RCS Referring Phys: [phone] [name] [name]  Sonographer Comments: Technically challenging study due to limited acoustic windows and Technically difficult study due to poor echo windows. IMPRESSIONS  1. Left ventricular ejection fraction, by estimation, is 60 to 65%. The left ventricle has normal function. The left ventricle has no regional wall motion abnormalities. Left ventricular diastolic parameters were normal.  2. Right ventricular systolic function is normal. The right ventricular size is normal.  3. The mitral valve is  abnormal. No evidence of mitral valve regurgitation. No evidence of mitral stenosis.  4. The aortic valve was not well visualized. There is mild calcification of the aortic valve. There is mild thickening of the aortic valve. Aortic valve regurgitation is trivial. Mild aortic valve stenosis.  5. The inferior vena cava is normal in  size with greater than 50% respiratory variability, suggesting right atrial pressure of 3 mmHg. FINDINGS  Left Ventricle: Left ventricular ejection fraction, by estimation, is 60 to 65%. The left ventricle has normal function. The left ventricle has no regional wall motion abnormalities. The left ventricular internal cavity size was normal in size. There is  no left ventricular hypertrophy. Left ventricular diastolic parameters were normal. Right Ventricle: The right ventricular size is normal. No increase in right ventricular wall thickness. Right ventricular systolic function is normal. Left Atrium: Left atrial size was normal in size. Right Atrium: Right atrial size was normal in size. Pericardium: There is no evidence of pericardial effusion. Mitral Valve: The mitral valve is abnormal. There is mild thickening of the mitral valve leaflet(s). No evidence of mitral valve regurgitation. No evidence of mitral valve stenosis. Tricuspid Valve: The tricuspid valve is normal in structure. Tricuspid valve regurgitation is not demonstrated. No evidence of tricuspid stenosis. Aortic Valve: The aortic valve was not well visualized. There is mild calcification of the aortic valve. There is mild thickening of the aortic valve. Aortic valve regurgitation is trivial. Aortic regurgitation PHT measures 309 msec. Mild aortic stenosis  is present. Aortic valve mean gradient measures 6.0 mmHg. Aortic valve peak gradient measures 11.7 mmHg. Aortic valve area, by VTI measures 1.70 cm. Pulmonic Valve: The pulmonic valve was normal in structure. Pulmonic valve regurgitation is mild. No evidence of pulmonic  stenosis. Aorta: The aortic root is normal in size and structure and the ascending aorta was not well visualized. Venous: The inferior vena cava is normal in size with greater than 50% respiratory variability, suggesting right atrial pressure of 3 mmHg. IAS/Shunts: No atrial level shunt detected by color flow Doppler.  LEFT VENTRICLE PLAX 2D LVIDd:         3.50 cm   Diastology LVIDs:         2.30 cm   LV e' medial:    9.57 cm/s LV PW:         0.80 cm   LV E/e' medial:  11.6 LV IVS:        0.80 cm   LV e' lateral:   8.92 cm/s LVOT diam:     1.60 cm   LV E/e' lateral: 12.4 LV SV:         41 LV SV Index:   29 LVOT Area:     2.01 cm  RIGHT VENTRICLE             IVC RV Basal diam:  2.90 cm     IVC diam: 1.90 cm RV S prime:     17.00 cm/s TAPSE (M-mode): 3.0 cm LEFT ATRIUM             Index        RIGHT ATRIUM          Index LA diam:        3.00 cm 2.12 cm/m   RA Area:     9.31 cm LA Vol (A2C):   42.1 ml 29.78 ml/m  RA Volume:   20.90 ml 14.78 ml/m LA Vol (A4C):   36.7 ml 25.96 ml/m LA Biplane Vol: 38.9 ml 27.52 ml/m  AORTIC VALVE                     PULMONIC VALVE AV Area (Vmax):    1.56 cm      PV Vmax:       0.78 m/s AV Area (Vmean):  1.58 cm      PV Peak grad:  2.5 mmHg AV Area (VTI):     1.70 cm AV Vmax:           171.00 cm/s AV Vmean:          115.000 cm/s AV VTI:            0.242 m AV Peak Grad:      11.7 mmHg AV Mean Grad:      6.0 mmHg LVOT Vmax:         133.00 cm/s LVOT Vmean:        90.300 cm/s LVOT VTI:          0.205 m LVOT/AV VTI ratio: 0.85 AI PHT:            309 msec  AORTA Ao Root diam: 2.40 cm MITRAL VALVE MV Area (PHT): 4.83 cm     SHUNTS MV Decel Time: 157 msec     Systemic VTI:  0.20 m MV E velocity: 111.00 cm/s  Systemic Diam: 1.60 cm MV Teegan Guinther velocity: 129.00 cm/s MV E/Falesha Schommer ratio:  0.86 Charlton Haws MD Electronically signed by Charlton Haws MD Signature Date/Time: 09/11/2022/2:19:22 PM    Final         Scheduled Meds:  sodium chloride   Intravenous Once   Chlorhexidine Gluconate Cloth  6  each Topical Daily   sucralfate  1 g Oral TID WC & HS   Continuous Infusions:  amiodarone 30 mg/hr (09/11/22 0403)     LOS: 4 days    Time spent: over 30 min    Lacretia Nicks, MD [facility]   To contact the attending provider between 7A-7P or the covering provider during after hours 7P-7A, please log into the web site [email or web address] and access using universal [place] password for that web site. If you do not have the password, please call the hospital operator.  09/11/2022, 3:06 PM

## 2022-09-11 NOTE — Progress Notes (Addendum)
DAILY PROGRESS NOTE   Patient Name: Sheena [name] Date of Encounter: 09/11/2022 Cardiologist: None  Chief Complaint   No complaints  Patient Profile   77 yo female with lung cancer, pancytopenia, undergoing chemotherapy and XRT, noted to be in afib with RVR today   Subjective   Noted to be in sinus tachycardia today on IV amiodarone. Potassium improved to 5.1 and magnesium 2.1 today.  Objective   Vitals:   09/10/22 1452 09/10/22 2018 09/11/22 0514 09/11/22 0909  BP: 94/64 (!) 112/51 (!) 104/52 (!) 117/50  Pulse: 88 (!) 110 93 99  Resp: 18 18 19  (!) 24  Temp: 98 F (36.7 C) 98.4 F (36.9 C) 98.4 F (36.9 C) 98.5 F (36.9 C)  TempSrc: Oral Oral Oral Oral  SpO2: 97% 98% 98% 100%  Weight:      Height:        Intake/Output Summary (Last 24 hours) at 09/11/2022 4782 Last data filed at 09/11/2022 0403 Gross per 24 hour  Intake 736.81 ml  Output --  Net 736.81 ml   Filed Weights   09/07/22 1216  Weight: 47.2 kg    Physical Exam   General appearance: alert and no distress Lungs: clear to auscultation bilaterally Heart: regular tachycardia Extremities: extremities normal, atraumatic, no cyanosis or edema Neurologic: Grossly normal  Inpatient Medications    Scheduled Meds:  sodium chloride   Intravenous Once   sodium chloride   Intravenous Once   Chlorhexidine Gluconate Cloth  6 each Topical Daily   sucralfate  1 g Oral TID WC & HS    Continuous Infusions:  amiodarone 30 mg/hr (09/11/22 0403)    PRN Meds: acetaminophen **OR** acetaminophen, albuterol, famotidine, ondansetron **OR** ondansetron (ZOFRAN) IV, senna-docusate, sodium chloride flush, traMADol, traZODone   Labs   Results for orders placed or performed during the hospital encounter of 09/07/22 (from the past 48 hour(s))  Prepare platelet pheresis     Status: None   Collection Time: 09/09/22  1:41 PM  Result Value Ref Range   Unit Number [id number]    Blood Component Type PLTP1  PSORALEN TREATED    Unit division 00    Status of Unit ISSUED,FINAL    Transfusion Status OK TO TRANSFUSE    Unit Number [id number]    Blood Component Type PLTP2 PSORALEN TREATED    Unit division 00    Status of Unit ISSUED,FINAL    Transfusion Status      OK TO TRANSFUSE Performed at [facility], [address] [place], [place] [address]   CBC with Differential/Platelet     Status: Abnormal   Collection Time: 09/10/22 10:37 AM  Result Value Ref Range   WBC 3.8 (L) 4.0 - 10.5 K/uL   RBC 2.55 (L) 3.87 - 5.11 MIL/uL   Hemoglobin 8.0 (L) 12.0 - 15.0 g/dL   HCT 72.5 (L) 36.6 - 44.0 %   MCV 95.3 80.0 - 100.0 fL   MCH 31.4 26.0 - 34.0 pg   MCHC 32.9 30.0 - 36.0 g/dL   RDW 34.7 42.5 - 95.6 %   Platelets 49 (L) 150 - 400 K/uL    Comment: Immature Platelet Fraction may be clinically indicated, consider ordering this additional test [id number] REPEATED TO VERIFY PLATELET COUNT CONFIRMED BY SMEAR    nRBC 0.0 0.0 - 0.2 %   Neutrophils Relative % 72 %   Neutro Abs 2.7 1.7 - 7.7 K/uL   Lymphocytes Relative 6 %   Lymphs Abs 0.2 (L)  0.7 - 4.0 K/uL   Monocytes Relative 11 %   Monocytes Absolute 0.4 0.1 - 1.0 K/uL   Eosinophils Relative 2 %   Eosinophils Absolute 0.1 0.0 - 0.5 K/uL   Basophils Relative 1 %   Basophils Absolute 0.0 0.0 - 0.1 K/uL   WBC Morphology DOHLE BODIES     Comment: TOXIC GRANULATION   Immature Granulocytes 8 %   Abs Immature Granulocytes 0.30 (H) 0.00 - 0.07 K/uL    Comment: Performed at [facility], [address], [place], [place] [address]  Comprehensive metabolic panel     Status: Abnormal   Collection Time: 09/10/22 10:37 AM  Result Value Ref Range   Sodium 133 (L) 135 - 145 mmol/L   Potassium 2.8 (L) 3.5 - 5.1 mmol/L   Chloride 97 (L) 98 - 111 mmol/L   CO2 27 22 - 32 mmol/L   Glucose, Bld 103 (H) 70 - 99 mg/dL    Comment: Glucose reference range applies only to samples taken after fasting for at least 8 hours.    BUN 13 8 - 23 mg/dL   Creatinine, Ser 6.04 0.44 - 1.00 mg/dL   Calcium 8.3 (L) 8.9 - 10.3 mg/dL   Total Protein 5.8 (L) 6.5 - 8.1 g/dL   Albumin 3.0 (L) 3.5 - 5.0 g/dL   AST 10 (L) 15 - 41 U/L   ALT 8 0 - 44 U/L   Alkaline Phosphatase 53 38 - 126 U/L   Total Bilirubin 1.0 0.3 - 1.2 mg/dL   GFR, Estimated >54 >09 mL/min    Comment: (NOTE) Calculated using the CKD-EPI Creatinine Equation (2021)    Anion gap 9 5 - 15    Comment: Performed at [facility], [address], [place], [place] [address]  Magnesium     Status: Abnormal   Collection Time: 09/10/22 10:37 AM  Result Value Ref Range   Magnesium 1.4 (L) 1.7 - 2.4 mg/dL    Comment: Performed at [facility], [address] [place], [place] [address]  Phosphorus     Status: None   Collection Time: 09/10/22 10:37 AM  Result Value Ref Range   Phosphorus 2.5 2.5 - 4.6 mg/dL    Comment: Performed at [facility], [address] [place], [place] [address]  Basic metabolic panel     Status: Abnormal   Collection Time: 09/11/22  4:18 AM  Result Value Ref Range   Sodium 133 (L) 135 - 145 mmol/L   Potassium 5.1 3.5 - 5.1 mmol/L   Chloride 102 98 - 111 mmol/L   CO2 26 22 - 32 mmol/L   Glucose, Bld 99 70 - 99 mg/dL    Comment: Glucose reference range applies only to samples taken after fasting for at least 8 hours.   BUN 12 8 - 23 mg/dL   Creatinine, Ser 0.86 0.44 - 1.00 mg/dL   Calcium 8.5 (L) 8.9 - 10.3 mg/dL   GFR, Estimated >57 >84 mL/min    Comment: (NOTE) Calculated using the CKD-EPI Creatinine Equation (2021)    Anion gap 5 5 - 15    Comment: Performed at [facility], [address] [place], [place] [address]  Magnesium     Status: None   Collection Time: 09/11/22  4:18 AM  Result Value Ref Range   Magnesium 2.0 1.7 - 2.4 mg/dL    Comment: Performed at [facility], [address], [place], [place] [address]  CBC with  Differential/Platelet     Status: Abnormal   Collection Time: 09/11/22  4:18 AM  Result Value Ref Range   WBC 4.5 4.0 - 10.5 K/uL   RBC 2.36 (L) 3.87 - 5.11 MIL/uL   Hemoglobin 7.3 (L) 12.0 - 15.0 g/dL   HCT 40.9 (L) 81.1 - 91.4 %   MCV 96.2 80.0 - 100.0 fL   MCH 30.9 26.0 - 34.0 pg   MCHC 32.2 30.0 - 36.0 g/dL   RDW 78.2 95.6 - 21.3 %   Platelets 24 (LL) 150 - 400 K/uL    Comment: Immature Platelet Fraction may be clinically indicated, consider ordering this additional test [id number] THIS CRITICAL RESULT HAS VERIFIED AND BEEN CALLED TO K.RYAN,RN BY ATCHISON,MARY ON 07 12 2024 AT 0458, AND HAS BEEN READ BACK.     nRBC 0.0 0.0 - 0.2 %   Neutrophils Relative % 68 %   Neutro Abs 3.1 1.7 - 7.7 K/uL   Lymphocytes Relative 6 %   Lymphs Abs 0.3 (L) 0.7 - 4.0 K/uL   Monocytes Relative 14 %   Monocytes Absolute 0.6 0.1 - 1.0 K/uL   Eosinophils Relative 1 %   Eosinophils Absolute 0.0 0.0 - 0.5 K/uL   Basophils Relative 1 %   Basophils Absolute 0.1 0.0 - 0.1 K/uL   WBC Morphology DOHLE BODIES     Comment: TOXIC GRANULATION   Immature Granulocytes 10 %   Abs Immature Granulocytes 0.44 (H) 0.00 - 0.07 K/uL    Comment: Performed at [facility], [address] [place], [place] [address]  Type and screen [facility]     Status: None (Preliminary result)   Collection Time: 09/11/22  4:18 AM  Result Value Ref Range   ABO/RH(D) A NEG    Antibody Screen NEG    Sample Expiration      09/14/2022,2359 Performed at [facility], [address] [place], [place] [address]    Unit Number [id number]    Blood Component Type RED CELLS,LR    Unit division 00    Status of Unit ALLOCATED    Transfusion Status OK TO TRANSFUSE    Crossmatch Result Compatible   Prepare RBC (crossmatch)     Status: None   Collection Time: 09/11/22  7:50 AM  Result Value Ref Range   Order Confirmation      ORDER PROCESSED BY BLOOD BANK Performed at [facility], [address], [place], [place] [address]     ECG   N/A  Telemetry   Sinus tachycardia - Personally Reviewed  Radiology    No results found.  Cardiac Studies   Echo pending  Assessment   Principal Problem:   Pancytopenia (HCC) Active Problems:   MURMUR   Primary small cell carcinoma of lower lobe of left lung (HCC)   New onset atrial fibrillation (HCC)   Hypokalemia   Hypomagnesemia   Plan   Converted to sinus tachycardia - would continue IV amiodarone for 24 hours to load, then can likely switch to oral tomorrow. Echo pending today. BP somewhat improved-  if echo does not show significant systolic heart failure, probably can restart low dose metoprolol as BP tolerates. Will review and follow-up over the weekend.  Time Spent Directly with Patient:  I have spent a total of 25 minutes with the patient reviewing hospital notes, telemetry, EKGs, labs and examining the patient as well as establishing an assessment and plan that was discussed  personally with the patient.  > 50% of time was spent in direct patient care.  Length of Stay:  LOS: 4 days   Chrystie Nose, MD, [facility], FACP  [place]  [facility]  Medical Director of the [facility]  Cardiovascular Risk Reduction Clinic Diplomate of the American Board of Clinical Lipidology Attending Cardiologist  Direct Dial: [phone]  Fax: [phone]  Website:  [email or web address] Nicely Murad Staples 09/11/2022, 9:29 AM

## 2022-09-11 NOTE — Progress Notes (Signed)
OT Cancellation Note  Patient Details Name: Sheena [name] MRN: [id number] DOB: 18-Apr-1945   Cancelled Treatment:    Reason Eval/Treat Not Completed: Medical issues which prohibited therapy. Pt currently with low platelets and to receive transfusion. Will attempt eval at later date.   Reuben Likes, OTR/L 09/11/2022, 1:54 PM

## 2022-09-12 ENCOUNTER — Other Ambulatory Visit: Payer: Self-pay

## 2022-09-12 DIAGNOSIS — D61818 Other pancytopenia: Secondary | ICD-10-CM | POA: Diagnosis not present

## 2022-09-12 DIAGNOSIS — I350 Nonrheumatic aortic (valve) stenosis: Secondary | ICD-10-CM | POA: Diagnosis not present

## 2022-09-12 DIAGNOSIS — I4891 Unspecified atrial fibrillation: Secondary | ICD-10-CM | POA: Diagnosis not present

## 2022-09-12 LAB — CBC WITH DIFFERENTIAL/PLATELET
Abs Immature Granulocytes: 0.26 10*3/uL — ABNORMAL HIGH (ref 0.00–0.07)
Basophils Absolute: 0.1 10*3/uL (ref 0.0–0.1)
Basophils Relative: 1 %
Eosinophils Absolute: 0 10*3/uL (ref 0.0–0.5)
Eosinophils Relative: 1 %
HCT: 26.3 % — ABNORMAL LOW (ref 36.0–46.0)
Hemoglobin: 8.8 g/dL — ABNORMAL LOW (ref 12.0–15.0)
Immature Granulocytes: 5 %
Lymphocytes Relative: 4 %
Lymphs Abs: 0.2 10*3/uL — ABNORMAL LOW (ref 0.7–4.0)
MCH: 33.1 pg (ref 26.0–34.0)
MCHC: 33.5 g/dL (ref 30.0–36.0)
MCV: 98.9 fL (ref 80.0–100.0)
Monocytes Absolute: 0.5 10*3/uL (ref 0.1–1.0)
Monocytes Relative: 9 %
Neutro Abs: 4.7 10*3/uL (ref 1.7–7.7)
Neutrophils Relative %: 80 %
Platelets: 39 10*3/uL — ABNORMAL LOW (ref 150–400)
RBC: 2.66 MIL/uL — ABNORMAL LOW (ref 3.87–5.11)
RDW: 14.2 % (ref 11.5–15.5)
WBC: 5.8 10*3/uL (ref 4.0–10.5)
nRBC: 0 % (ref 0.0–0.2)

## 2022-09-12 LAB — COMPREHENSIVE METABOLIC PANEL
ALT: 8 U/L (ref 0–44)
AST: 11 U/L — ABNORMAL LOW (ref 15–41)
Albumin: 2.9 g/dL — ABNORMAL LOW (ref 3.5–5.0)
Alkaline Phosphatase: 58 U/L (ref 38–126)
Anion gap: 5 (ref 5–15)
BUN: 14 mg/dL (ref 8–23)
CO2: 27 mmol/L (ref 22–32)
Calcium: 8.6 mg/dL — ABNORMAL LOW (ref 8.9–10.3)
Chloride: 102 mmol/L (ref 98–111)
Creatinine, Ser: 0.91 mg/dL (ref 0.44–1.00)
GFR, Estimated: >60 mL/min (ref >60)
Glucose, Bld: 103 mg/dL — ABNORMAL HIGH (ref 70–99)
Potassium: 4 mmol/L (ref 3.5–5.1)
Sodium: 134 mmol/L — ABNORMAL LOW (ref 135–145)
Total Bilirubin: 1.4 mg/dL — ABNORMAL HIGH (ref 0.3–1.2)
Total Protein: 5.5 g/dL — ABNORMAL LOW (ref 6.5–8.1)

## 2022-09-12 LAB — TYPE AND SCREEN
Antibody Screen: NEGATIVE
Unit division: 0

## 2022-09-12 LAB — PREPARE PLATELET PHERESIS

## 2022-09-12 LAB — BPAM RBC
Blood Product Expiration Date: 202408142359
Unit Type and Rh: 600

## 2022-09-12 LAB — BPAM PLATELET PHERESIS
Blood Product Expiration Date: 202407152359
ISSUE DATE / TIME: 202407121610
Unit Type and Rh: 5100

## 2022-09-12 LAB — PHOSPHORUS: Phosphorus: 1.9 mg/dL — ABNORMAL LOW (ref 2.5–4.6)

## 2022-09-12 LAB — MAGNESIUM: Magnesium: 1.4 mg/dL — ABNORMAL LOW (ref 1.7–2.4)

## 2022-09-12 MED ORDER — K PHOS MONO-SOD PHOS DI & MONO 155-852-130 MG PO TABS
500.0000 mg | ORAL_TABLET | Freq: Four times a day (QID) | ORAL | Status: AC
  Administered 2022-09-12 – 2022-09-13 (×4): 500 mg via ORAL
  Filled 2022-09-12 (×4): qty 2

## 2022-09-12 MED ORDER — MAGNESIUM SULFATE 4 GM/100ML IV SOLN
4.0000 g | Freq: Once | INTRAVENOUS | Status: AC
  Administered 2022-09-12: 4 g via INTRAVENOUS
  Filled 2022-09-12: qty 100

## 2022-09-12 MED ORDER — MAGNESIUM OXIDE -MG SUPPLEMENT 400 (240 MG) MG PO TABS
400.0000 mg | ORAL_TABLET | Freq: Every day | ORAL | Status: DC
  Administered 2022-09-12 – 2022-09-14 (×3): 400 mg via ORAL
  Filled 2022-09-12 (×3): qty 1

## 2022-09-12 MED ORDER — AMIODARONE HCL 200 MG PO TABS
200.0000 mg | ORAL_TABLET | Freq: Two times a day (BID) | ORAL | Status: DC
  Administered 2022-09-12 – 2022-09-14 (×5): 200 mg via ORAL
  Filled 2022-09-12 (×5): qty 1

## 2022-09-12 NOTE — Progress Notes (Signed)
PROGRESS NOTE    Sheena [name]  JGG:[id number] DOB: 04/20/1945 DOA: 09/07/2022 PCP: Sheena Majestic, MD  Chief Complaint  Patient presents with   Weakness    Brief Narrative:   77 y.o. female with medical history significant for hypertension, small cell lung cancer on chemo and radiation being admitted to the hospital with severe symptomatic anemia as well as thrombocytopenia  Her last radiation was 7/5, and her last chemotherapy was Sheena [name] week before that.  Found to be relatively hypotensive, and lab work revealed hemoglobin 4.7, WBC 1.1, platelets less than 5, negative troponin x 2, creatinine up to 1.23 from normal baseline. Potassium 3.0, magnesium 1.5. She was given IV magnesium, IV potassium, and orders have been placed for 2 unit blood transfusion and 1 unit of packed platelets   Assessment & Plan:   Principal Problem:   Pancytopenia (HCC) Active Problems:   Sheena [name]   Primary small cell carcinoma of lower lobe of left lung (HCC)   New onset atrial fibrillation (HCC)   Hypokalemia   Hypomagnesemia  Atrial Fibrillation with RVR - appreciate cardiology recs -- continuing amio another 24 hrs - echo (EF 60-65%, no RWMA, EKG (fib with RVR) - continue telemetry monitoring  - appreciate Sheena [name] assistance -> plan for amiodarone 200 mg BID x1 week, then 200 mg daily  Chemotherapy-induced pancytopenia -s/p Granix -ANC recovered  -s/p 3 units pRBC.  Transfuse to keep hemoglobin greater than 8.0 (trend). -s/p 4 units platelets- > transfuse for < 20,000    Small cell lung cancer -S/p 3 cycles of chemotherapy and radiation treatment -Cycle #4 of chemotherapy as outpatient per Sheena [name] (plan to delay by at least 1 week and dose of chemo maybe reduced)   Mild Aortic Stenosis - needs outpatient follow up   Hypertension -metop -norvasc on hold   Chronic pain -Continue Ultram as needed   Hypokalemia -improved   Hypomagnesemia -improved     DVT prophylaxis:  SCD Code Status: full Family Communication: none Disposition:   Status is: Inpatient Remains inpatient appropriate because: continued need for inpatient care, pancytopenia   Consultants:  oncology  Procedures:  none  Antimicrobials:  Anti-infectives (From admission, onward)    None       Subjective: No complaints Express some worry about future chemo/radiation   Objective: Vitals:   09/11/22 2011 09/12/22 0000 09/12/22 0503 09/12/22 1323  BP: 132/68 116/61 (!) 119/49 (!) 116/54  Pulse: (!) 106 88 92 79  Resp: 12 18 18 18   Temp: 97.9 F (36.6 C) 98 F (36.7 C) 98.1 F (36.7 C) (!) 97.5 F (36.4 C)  TempSrc: Oral Oral  Oral  SpO2: 100% 97% 99% 99%  Weight:      Height:        Intake/Output Summary (Last 24 hours) at 09/12/2022 1418 Last data filed at 09/12/2022 1348 Gross per 24 hour  Intake 1304 ml  Output --  Net 1304 ml   Filed Weights   09/07/22 1216  Weight: 47.2 kg    Examination:  General: No acute distress. Cardiovascular: RRR Lungs: unlabored Neurological: Alert and oriented 3. Moves all extremities 4 with equal strength. Cranial nerves II through XII grossly intact. Extremities: No clubbing or cyanosis. No edema.   Data Reviewed: I have personally reviewed following labs and imaging studies  CBC: Recent Labs  Lab 09/08/22 0856 09/09/22 0842 09/10/22 1037 09/11/22 0418 09/12/22 0318  WBC 1.5* 4.1 3.8* 4.5 5.8  NEUTROABS 1.2* 3.4 2.7 3.1 4.7  HGB  8.7* 8.8* 8.0* 7.3* 8.8*  HCT 25.1* 26.5* 24.3* 22.7* 26.3*  MCV 90.6 94.0 95.3 96.2 98.9  PLT 24* 7* 49* 24* 39*    Basic Metabolic Panel: Recent Labs  Lab 09/07/22 1303 09/07/22 1426 09/08/22 0856 09/09/22 0002 09/10/22 1037 09/11/22 0418 09/12/22 0318  NA 133*   < > 137 137 133* 133* 134*  K 2.9*   < > 2.7* 3.5 2.8* 5.1 4.0  CL 88*   < > 94* 100 97* 102 102  CO2 31   < > 31 30 27 26 27   GLUCOSE 129*   < > 119* 101* 103* 99 103*  BUN 53*   < > 39* 25* 13 12 14    CREATININE 1.15*   < > 0.86 0.69 0.67 0.66 0.91  CALCIUM 8.4*   < > 8.2* 8.3* 8.3* 8.5* 8.6*  MG 1.5*  --  2.1  --  1.4* 2.0 1.4*  PHOS  --   --   --   --  2.5  --  1.9*   < > = values in this interval not displayed.    GFR: Estimated Creatinine Clearance: 38.4 mL/min (by C-G formula based on SCr of 0.91 mg/dL).  Liver Function Tests: Recent Labs  Lab 09/07/22 1303 09/10/22 1037 09/12/22 0318  AST 9* 10* 11*  ALT 11 8 8   ALKPHOS 53 53 58  BILITOT 1.9* 1.0 1.4*  PROT 6.7 5.8* 5.5*  ALBUMIN 3.3* 3.0* 2.9*    CBG: Recent Labs  Lab 09/07/22 1335  GLUCAP 115*     No results found for this or any previous visit (from the past 240 hour(s)).       Radiology Studies: ECHOCARDIOGRAM COMPLETE  Result Date: 09/11/2022    ECHOCARDIOGRAM REPORT   Patient Name:   Sheena [name] Date of Exam: 09/11/2022 Medical Rec #:  [id number]      Height:       60.0 in Accession #:    [id number]     Weight:       104.0 lb Date of Birth:  04-20-1945      BSA:          1.414 m Patient Age:    77 years       BP:           114/48 mmHg Patient Gender: F              HR:           103 bpm. Exam Location:  Inpatient Procedure: 2D Echo, Cardiac Doppler, Color Doppler and Strain Analysis Indications:    Sheena [name]-Fib I48.91  History:        Patient has prior history of Echocardiogram examinations, most                 recent 11/13/2012. Chemo therapy, Arrythmias:Atrial Fibrillation;                 Risk Factors:Hypertension, Dyslipidemia and Former Smoker.  Sonographer:    Sheena [name] RVT RCS Referring Phys: [phone] Sheena [name] Sheena [name]  Sonographer Comments: Technically challenging study due to limited acoustic windows and Technically difficult study due to poor echo windows. IMPRESSIONS  1. Left ventricular ejection fraction, by estimation, is 60 to 65%. The left ventricle has normal function. The left ventricle has no regional wall motion abnormalities. Left ventricular diastolic parameters were normal.  2. Right  ventricular systolic function is normal. The right ventricular size is normal.  3. The mitral valve  is abnormal. No evidence of mitral valve regurgitation. No evidence of mitral stenosis.  4. The aortic valve was not well visualized. There is mild calcification of the aortic valve. There is mild thickening of the aortic valve. Aortic valve regurgitation is trivial. Mild aortic valve stenosis.  5. The inferior vena cava is normal in size with greater than 50% respiratory variability, suggesting right atrial pressure of 3 mmHg. FINDINGS  Left Ventricle: Left ventricular ejection fraction, by estimation, is 60 to 65%. The left ventricle has normal function. The left ventricle has no regional wall motion abnormalities. The left ventricular internal cavity size was normal in size. There is  no left ventricular hypertrophy. Left ventricular diastolic parameters were normal. Right Ventricle: The right ventricular size is normal. No increase in right ventricular wall thickness. Right ventricular systolic function is normal. Left Atrium: Left atrial size was normal in size. Right Atrium: Right atrial size was normal in size. Pericardium: There is no evidence of pericardial effusion. Mitral Valve: The mitral valve is abnormal. There is mild thickening of the mitral valve leaflet(s). No evidence of mitral valve regurgitation. No evidence of mitral valve stenosis. Tricuspid Valve: The tricuspid valve is normal in structure. Tricuspid valve regurgitation is not demonstrated. No evidence of tricuspid stenosis. Aortic Valve: The aortic valve was not well visualized. There is mild calcification of the aortic valve. There is mild thickening of the aortic valve. Aortic valve regurgitation is trivial. Aortic regurgitation PHT measures 309 msec. Mild aortic stenosis  is present. Aortic valve mean gradient measures 6.0 mmHg. Aortic valve peak gradient measures 11.7 mmHg. Aortic valve area, by VTI measures 1.70 cm. Pulmonic Valve: The  pulmonic valve was normal in structure. Pulmonic valve regurgitation is mild. No evidence of pulmonic stenosis. Aorta: The aortic root is normal in size and structure and the ascending aorta was not well visualized. Venous: The inferior vena cava is normal in size with greater than 50% respiratory variability, suggesting right atrial pressure of 3 mmHg. IAS/Shunts: No atrial level shunt detected by color flow Doppler.  LEFT VENTRICLE PLAX 2D LVIDd:         3.50 cm   Diastology LVIDs:         2.30 cm   LV e' medial:    9.57 cm/s LV PW:         0.80 cm   LV E/e' medial:  11.6 LV IVS:        0.80 cm   LV e' lateral:   8.92 cm/s LVOT diam:     1.60 cm   LV E/e' lateral: 12.4 LV SV:         41 LV SV Index:   29 LVOT Area:     2.01 cm  RIGHT VENTRICLE             IVC RV Basal diam:  2.90 cm     IVC diam: 1.90 cm RV S prime:     17.00 cm/s TAPSE (M-mode): 3.0 cm LEFT ATRIUM             Index        RIGHT ATRIUM          Index LA diam:        3.00 cm 2.12 cm/m   RA Area:     9.31 cm LA Vol (A2C):   42.1 ml 29.78 ml/m  RA Volume:   20.90 ml 14.78 ml/m LA Vol (A4C):   36.7 ml 25.96 ml/m LA Biplane Vol: 38.9  ml 27.52 ml/m  AORTIC VALVE                     PULMONIC VALVE AV Area (Vmax):    1.56 cm      PV Vmax:       0.78 m/s AV Area (Vmean):   1.58 cm      PV Peak grad:  2.5 mmHg AV Area (VTI):     1.70 cm AV Vmax:           171.00 cm/s AV Vmean:          115.000 cm/s AV VTI:            0.242 m AV Peak Grad:      11.7 mmHg AV Mean Grad:      6.0 mmHg LVOT Vmax:         133.00 cm/s LVOT Vmean:        90.300 cm/s LVOT VTI:          0.205 m LVOT/AV VTI ratio: 0.85 AI PHT:            309 msec  AORTA Ao Root diam: 2.40 cm MITRAL VALVE MV Area (PHT): 4.83 cm     SHUNTS MV Decel Time: 157 msec     Systemic VTI:  0.20 m MV E velocity: 111.00 cm/s  Systemic Diam: 1.60 cm MV Hancel Ion velocity: 129.00 cm/s MV E/Meliton Samad ratio:  0.86 Charlton Haws MD Electronically signed by Charlton Haws MD Signature Date/Time: 09/11/2022/2:19:22 PM    Final          Scheduled Meds:  sodium chloride   Intravenous Once   amiodarone  200 mg Oral BID   Chlorhexidine Gluconate Cloth  6 each Topical Daily   magnesium oxide  400 mg Oral Daily   metoprolol tartrate  12.5 mg Oral BID   phosphorus  500 mg Oral QID   sucralfate  1 g Oral TID WC & HS   Continuous Infusions:  magnesium sulfate bolus IVPB       LOS: 5 days    Time spent: over 30 min    Lacretia Nicks, MD [facility]   To contact the attending provider between 7A-7P or the covering provider during after hours 7P-7A, please log into the web site [email or web address] and access using universal [place] password for that web site. If you do not have the password, please call the hospital operator.  09/12/2022, 2:18 PM

## 2022-09-12 NOTE — Evaluation (Addendum)
Occupational Therapy Evaluation Patient Details Name: Sheena [name] MRN: [id number] DOB: Mar 07, 1945 Today's Date: 09/12/2022   History of Present Illness Pt is a 77 y/o F presenting to ED on 7/8 with severe symptomatic anemia and thrombocytopenia. PMH incldues HTN, small cell lung CA on chemo and radiation, anxiety, HTN.   Clinical Impression   Pt reports having assist at baseline for LB ADLs, is ind with mobility and does not use AD. Lives with spouse in 2 level home. Pt currently needing set up -mod A for ADLs, min A for bed mobility and min A for transfer with 1 person HHA. Pt with 2/4 DOE after walking to bathroom and back. Pt presenting with impairments listed below, will follow acutely. Recommend HHOT at d/c.       Recommendations for follow up therapy are one component of a multi-disciplinary discharge planning process, led by the attending physician.  Recommendations may be updated based on patient status, additional functional criteria and insurance authorization.   Assistance Recommended at Discharge Frequent or constant Supervision/Assistance  Patient can return home with the following A little help with walking and/or transfers;A lot of help with bathing/dressing/bathroom;Assistance with cooking/housework;Direct supervision/assist for medications management;Direct supervision/assist for financial management;Assist for transportation;Help with stairs or ramp for entrance    Functional Status Assessment  Patient has had a recent decline in their functional status and demonstrates the ability to make significant improvements in function in a reasonable and predictable amount of time.  Equipment Recommendations  Tub/shower seat;Hospital bed    Recommendations for Other Services PT consult     Precautions / Restrictions Precautions Precautions: Fall Restrictions Weight Bearing Restrictions: No      Mobility Bed Mobility Overal bed mobility: Needs Assistance Bed Mobility:  Supine to Sit, Sit to Supine     Supine to sit: Min assist Sit to supine: Min assist   General bed mobility comments: incr time, cues to scoot forward to get feet on floor    Transfers Overall transfer level: Needs assistance Equipment used: 1 person hand held assist Transfers: Sit to/from Stand Sit to Stand: Min assist                  Balance Overall balance assessment: Needs assistance Sitting-balance support: Feet supported Sitting balance-Leahy Scale: Fair Sitting balance - Comments: sits EOB to don shoes   Standing balance support: During functional activity Standing balance-Leahy Scale: Poor Standing balance comment: reliant on external support                           ADL either performed or assessed with clinical judgement   ADL Overall ADL's : Needs assistance/impaired Eating/Feeding: Set up;Sitting   Grooming: Wash/dry face;Standing;Min guard   Upper Body Bathing: Minimal assistance;Sitting   Lower Body Bathing: Moderate assistance;Sitting/lateral leans   Upper Body Dressing : Minimal assistance;Sitting   Lower Body Dressing: Moderate assistance;Sitting/lateral leans   Toilet Transfer: Ambulation;Regular Toilet;Minimal assistance   Toileting- Clothing Manipulation and Hygiene: Min guard;Sitting/lateral lean       Functional mobility during ADLs: Minimal assistance       Vision   Vision Assessment?: No apparent visual deficits     Perception Perception Perception Tested?: No   Praxis Praxis Praxis tested?: Not tested    Pertinent Vitals/Pain Pain Assessment Pain Assessment: No/denies pain     Hand Dominance Right   Extremity/Trunk Assessment Upper Extremity Assessment Upper Extremity Assessment: Generalized weakness   Lower Extremity Assessment Lower Extremity Assessment:  Generalized weakness       Communication Communication Communication: No difficulties   Cognition Arousal/Alertness: Awake/alert Behavior  During Therapy: WFL for tasks assessed/performed Overall Cognitive Status: Within Functional Limits for tasks assessed                                       General Comments  VSS on RA    Exercises     Shoulder Instructions      Home Living Family/patient expects to be discharged to:: Private residence Living Arrangements: Spouse/significant other Available Help at Discharge: Family;Available 24 hours/day Type of Home: House Home Access: Stairs to enter [employer] of Steps: 3-4   Home Layout: Two level;Bed/bath upstairs Alternate Level Stairs-Number of Steps: 14 steps   Bathroom Shower/Tub: Producer, television/film/video: Handicapped height     Home Equipment: Cane - single Librarian, academic (2 wheels)          Prior Functioning/Environment Prior Level of Function : Independent/Modified Independent             Mobility Comments: no AD ADLs Comments: assist for LB ADL, does not drive        OT Problem List: Decreased strength;Decreased range of motion;Decreased activity tolerance      OT Treatment/Interventions: Self-care/ADL training;Therapeutic exercise;Energy conservation;DME and/or AE instruction;Therapeutic activities;Patient/family education;Balance training;Cognitive remediation/compensation    OT Goals(Current goals can be found in the care plan section) Acute Rehab OT Goals Patient Stated Goal: none stated OT Goal Formulation: With patient Time For Goal Achievement: 09/26/22 Potential to Achieve Goals: Good ADL Goals Pt Will Perform Upper Body Dressing: with supervision;sitting Pt Will Perform Lower Body Dressing: with supervision;sitting/lateral leans;sit to/from stand Pt Will Transfer to Toilet: with supervision;ambulating;regular height toilet Additional ADL Goal #1: pt will be able to stand x5 min for functional task in order to improve activity tolerance for ADLs  OT Frequency: Min 1X/week    Co-evaluation               AM-PAC OT "6 Clicks" Daily Activity     Outcome Measure Help from another person eating meals?: None Help from another person taking care of personal grooming?: A Little Help from another person toileting, which includes using toliet, bedpan, or urinal?: A Little Help from another person bathing (including washing, rinsing, drying)?: A Lot Help from another person to put on and taking off regular upper body clothing?: A Little Help from another person to put on and taking off regular lower body clothing?: A Lot 6 Click Score: 17   End of Session Nurse Communication: Mobility status  Activity Tolerance: Patient tolerated treatment well Patient left: in bed;with call bell/phone within reach;with bed alarm set;with family/visitor present  OT Visit Diagnosis: Other abnormalities of gait and mobility (R26.89);Unsteadiness on feet (R26.81);Muscle weakness (generalized) (M62.81)                Time: 1610-9604 OT Time Calculation (min): 25 min Charges:  OT General Charges $OT Visit: 1 Visit OT Evaluation $OT Eval Moderate Complexity: 1 Mod OT Treatments $Self Care/Home Management : 8-22 mins  Carver Fila, OTD, OTR/L SecureChat Preferred Acute Rehab [phone]   Carver Fila Koonce 09/12/2022, 12:14 PM

## 2022-09-12 NOTE — Progress Notes (Signed)
DAILY PROGRESS NOTE   Patient Name: Sheena [name] Date of Encounter: 09/12/2022 Cardiologist: Chrystie Nose, MD  Chief Complaint   No complaints  Patient Profile   77 yo female with lung cancer, pancytopenia, undergoing chemotherapy and XRT, noted to be in afib with RVR today   Subjective   Now in sinus rhythm in the 70's. Echo yesterday showed preserved LVEF 60-65%, mild AS with trivial AI, normal biatrial size. Magnesium again low today at 1.4.  Objective   Vitals:   09/11/22 1956 09/11/22 2011 09/12/22 0000 09/12/22 0503  BP: 135/61 132/68 116/61 (!) 119/49  Pulse: (!) 101 (!) 106 88 92  Resp: 16 12 18 18   Temp: 98 F (36.7 C) 97.9 F (36.6 C) 98 F (36.7 C) 98.1 F (36.7 C)  TempSrc: Oral Oral Oral   SpO2: 99% 100% 97% 99%  Weight:      Height:        Intake/Output Summary (Last 24 hours) at 09/12/2022 1006 Last data filed at 09/11/2022 2011 Gross per 24 hour  Intake 944 ml  Output --  Net 944 ml   Filed Weights   09/07/22 1216  Weight: 47.2 kg    Physical Exam   General appearance: alert and no distress Lungs: clear to auscultation bilaterally Heart: regular rate and rhythm, systolic murmur: early systolic 3/6, crescendo at 2nd right intercostal space, and regular tachycardia Extremities: extremities normal, atraumatic, no cyanosis or edema Neurologic: Grossly normal  Inpatient Medications    Scheduled Meds:  sodium chloride   Intravenous Once   Chlorhexidine Gluconate Cloth  6 each Topical Daily   magnesium oxide  400 mg Oral Daily   metoprolol tartrate  12.5 mg Oral BID   phosphorus  500 mg Oral QID   sucralfate  1 g Oral TID WC & HS    Continuous Infusions:  amiodarone 30 mg/hr (09/12/22 0623)   magnesium sulfate bolus IVPB      PRN Meds: acetaminophen **OR** acetaminophen, albuterol, famotidine, ondansetron **OR** ondansetron (ZOFRAN) IV, senna-docusate, sodium chloride flush, traMADol, traZODone   Labs   Results for orders  placed or performed during the hospital encounter of 09/07/22 (from the past 48 hour(s))  CBC with Differential/Platelet     Status: Abnormal   Collection Time: 09/10/22 10:37 AM  Result Value Ref Range   WBC 3.8 (L) 4.0 - 10.5 K/uL   RBC 2.55 (L) 3.87 - 5.11 MIL/uL   Hemoglobin 8.0 (L) 12.0 - 15.0 g/dL   HCT 16.1 (L) 09.6 - 04.5 %   MCV 95.3 80.0 - 100.0 fL   MCH 31.4 26.0 - 34.0 pg   MCHC 32.9 30.0 - 36.0 g/dL   RDW 40.9 81.1 - 91.4 %   Platelets 49 (L) 150 - 400 K/uL    Comment: Immature Platelet Fraction may be clinically indicated, consider ordering this additional test [id number] REPEATED TO VERIFY PLATELET COUNT CONFIRMED BY SMEAR    nRBC 0.0 0.0 - 0.2 %   Neutrophils Relative % 72 %   Neutro Abs 2.7 1.7 - 7.7 K/uL   Lymphocytes Relative 6 %   Lymphs Abs 0.2 (L) 0.7 - 4.0 K/uL   Monocytes Relative 11 %   Monocytes Absolute 0.4 0.1 - 1.0 K/uL   Eosinophils Relative 2 %   Eosinophils Absolute 0.1 0.0 - 0.5 K/uL   Basophils Relative 1 %   Basophils Absolute 0.0 0.0 - 0.1 K/uL   WBC Morphology DOHLE BODIES     Comment: TOXIC  GRANULATION   Immature Granulocytes 8 %   Abs Immature Granulocytes 0.30 (H) 0.00 - 0.07 K/uL    Comment: Performed at [facility], [address], [place], [place] [address]  Comprehensive metabolic panel     Status: Abnormal   Collection Time: 09/10/22 10:37 AM  Result Value Ref Range   Sodium 133 (L) 135 - 145 mmol/L   Potassium 2.8 (L) 3.5 - 5.1 mmol/L   Chloride 97 (L) 98 - 111 mmol/L   CO2 27 22 - 32 mmol/L   Glucose, Bld 103 (H) 70 - 99 mg/dL    Comment: Glucose reference range applies only to samples taken after fasting for at least 8 hours.   BUN 13 8 - 23 mg/dL   Creatinine, Ser 6.04 0.44 - 1.00 mg/dL   Calcium 8.3 (L) 8.9 - 10.3 mg/dL   Total Protein 5.8 (L) 6.5 - 8.1 g/dL   Albumin 3.0 (L) 3.5 - 5.0 g/dL   AST 10 (L) 15 - 41 U/L   ALT 8 0 - 44 U/L   Alkaline Phosphatase 53 38 - 126 U/L   Total Bilirubin 1.0 0.3  - 1.2 mg/dL   GFR, Estimated >54 >09 mL/min    Comment: (NOTE) Calculated using the CKD-EPI Creatinine Equation (2021)    Anion gap 9 5 - 15    Comment: Performed at [facility], [address], [place], [place] [address]  Magnesium     Status: Abnormal   Collection Time: 09/10/22 10:37 AM  Result Value Ref Range   Magnesium 1.4 (L) 1.7 - 2.4 mg/dL    Comment: Performed at [facility], [address], [place], [place] [address]  Phosphorus     Status: None   Collection Time: 09/10/22 10:37 AM  Result Value Ref Range   Phosphorus 2.5 2.5 - 4.6 mg/dL    Comment: Performed at [facility], [address], [place], [place] [address]  Basic metabolic panel     Status: Abnormal   Collection Time: 09/11/22  4:18 AM  Result Value Ref Range   Sodium 133 (L) 135 - 145 mmol/L   Potassium 5.1 3.5 - 5.1 mmol/L   Chloride 102 98 - 111 mmol/L   CO2 26 22 - 32 mmol/L   Glucose, Bld 99 70 - 99 mg/dL    Comment: Glucose reference range applies only to samples taken after fasting for at least 8 hours.   BUN 12 8 - 23 mg/dL   Creatinine, Ser 0.86 0.44 - 1.00 mg/dL   Calcium 8.5 (L) 8.9 - 10.3 mg/dL   GFR, Estimated >57 >84 mL/min    Comment: (NOTE) Calculated using the CKD-EPI Creatinine Equation (2021)    Anion gap 5 5 - 15    Comment: Performed at [facility], [address] [place], [place] [address]  Magnesium     Status: None   Collection Time: 09/11/22  4:18 AM  Result Value Ref Range   Magnesium 2.0 1.7 - 2.4 mg/dL    Comment: Performed at [facility], [address], [place], [place] [address]  CBC with Differential/Platelet     Status: Abnormal   Collection Time: 09/11/22  4:18 AM  Result Value Ref Range   WBC 4.5 4.0 - 10.5 K/uL   RBC 2.36 (L) 3.87 - 5.11 MIL/uL   Hemoglobin 7.3 (L) 12.0 - 15.0 g/dL   HCT 32.4 (L) 40.1 - 02.7 %   MCV  96.2 80.0 - 100.0 fL   MCH 30.9 26.0 - 34.0 pg    MCHC 32.2 30.0 - 36.0 g/dL   RDW 09.8 11.9 - 14.7 %   Platelets 24 (LL) 150 - 400 K/uL    Comment: Immature Platelet Fraction may be clinically indicated, consider ordering this additional test [id number] THIS CRITICAL RESULT HAS VERIFIED AND BEEN CALLED TO K.RYAN,RN BY ATCHISON,MARY ON 07 12 2024 AT 0458, AND HAS BEEN READ BACK.     nRBC 0.0 0.0 - 0.2 %   Neutrophils Relative % 68 %   Neutro Abs 3.1 1.7 - 7.7 K/uL   Lymphocytes Relative 6 %   Lymphs Abs 0.3 (L) 0.7 - 4.0 K/uL   Monocytes Relative 14 %   Monocytes Absolute 0.6 0.1 - 1.0 K/uL   Eosinophils Relative 1 %   Eosinophils Absolute 0.0 0.0 - 0.5 K/uL   Basophils Relative 1 %   Basophils Absolute 0.1 0.0 - 0.1 K/uL   WBC Morphology DOHLE BODIES     Comment: TOXIC GRANULATION   Immature Granulocytes 10 %   Abs Immature Granulocytes 0.44 (H) 0.00 - 0.07 K/uL    Comment: Performed at [facility], [address], [place], [place] [address]  Type and screen [facility]     Status: None (Preliminary result)   Collection Time: 09/11/22  4:18 AM  Result Value Ref Range   ABO/RH(D) A NEG    Antibody Screen NEG    Sample Expiration 09/14/2022,2359    Unit Number [id number]    Blood Component Type RED CELLS,LR    Unit division 00    Status of Unit ISSUED    Transfusion Status OK TO TRANSFUSE    Crossmatch Result      Compatible Performed at [facility], [address], [place], [place] [address]   Prepare RBC (crossmatch)     Status: None   Collection Time: 09/11/22  7:50 AM  Result Value Ref Range   Order Confirmation      ORDER PROCESSED BY BLOOD BANK Performed at [facility], [address], [place], [place] [address]   Prepare platelet pheresis     Status: None (Preliminary result)   Collection Time: 09/11/22 11:01 AM  Result Value Ref Range   Unit Number [id number]    Blood Component Type PSORALEN TREATED    Unit division 00     Status of Unit ISSUED    Transfusion Status      OK TO TRANSFUSE Performed at [facility], [address] [place], [place] [address]   CBC with Differential/Platelet     Status: Abnormal   Collection Time: 09/12/22  3:18 AM  Result Value Ref Range   WBC 5.8 4.0 - 10.5 K/uL   RBC 2.66 (L) 3.87 - 5.11 MIL/uL   Hemoglobin 8.8 (L) 12.0 - 15.0 g/dL   HCT 56.4 (L) 33.2 - 95.1 %   MCV 98.9 80.0 - 100.0 fL   MCH 33.1 26.0 - 34.0 pg   MCHC 33.5 30.0 - 36.0 g/dL   RDW 88.4 16.6 - 06.3 %   Platelets 39 (L) 150 - 400 K/uL    Comment: SPECIMEN CHECKED FOR CLOTS Immature Platelet Fraction may be clinically indicated, consider ordering this additional test [id number] REPEATED TO VERIFY PLATELET COUNT CONFIRMED BY SMEAR    nRBC 0.0 0.0 - 0.2 %   Neutrophils Relative % 80 %   Neutro Abs 4.7 1.7 - 7.7  K/uL   Lymphocytes Relative 4 %   Lymphs Abs 0.2 (L) 0.7 - 4.0 K/uL   Monocytes Relative 9 %   Monocytes Absolute 0.5 0.1 - 1.0 K/uL   Eosinophils Relative 1 %   Eosinophils Absolute 0.0 0.0 - 0.5 K/uL   Basophils Relative 1 %   Basophils Absolute 0.1 0.0 - 0.1 K/uL   WBC Morphology DOHLE BODIES     Comment: Mild Left Shift (1-5% metas, occ myelo)   Immature Granulocytes 5 %   Abs Immature Granulocytes 0.26 (H) 0.00 - 0.07 K/uL    Comment: Performed at [facility], [address], [place], [place] [address]  Comprehensive metabolic panel     Status: Abnormal   Collection Time: 09/12/22  3:18 AM  Result Value Ref Range   Sodium 134 (L) 135 - 145 mmol/L   Potassium 4.0 3.5 - 5.1 mmol/L   Chloride 102 98 - 111 mmol/L   CO2 27 22 - 32 mmol/L   Glucose, Bld 103 (H) 70 - 99 mg/dL    Comment: Glucose reference range applies only to samples taken after fasting for at least 8 hours.   BUN 14 8 - 23 mg/dL   Creatinine, Ser 6.04 0.44 - 1.00 mg/dL   Calcium 8.6 (L) 8.9 - 10.3 mg/dL   Total Protein 5.5 (L) 6.5 - 8.1 g/dL   Albumin 2.9 (L) 3.5 - 5.0 g/dL    AST 11 (L) 15 - 41 U/L   ALT 8 0 - 44 U/L   Alkaline Phosphatase 58 38 - 126 U/L   Total Bilirubin 1.4 (H) 0.3 - 1.2 mg/dL   GFR, Estimated >54 >09 mL/min    Comment: (NOTE) Calculated using the CKD-EPI Creatinine Equation (2021)    Anion gap 5 5 - 15    Comment: Performed at [facility], [address] [place], [place] [address]  Magnesium     Status: Abnormal   Collection Time: 09/12/22  3:18 AM  Result Value Ref Range   Magnesium 1.4 (L) 1.7 - 2.4 mg/dL    Comment: Performed at [facility], [address] [place], [place] [address]  Phosphorus     Status: Abnormal   Collection Time: 09/12/22  3:18 AM  Result Value Ref Range   Phosphorus 1.9 (L) 2.5 - 4.6 mg/dL    Comment: Performed at [facility], [address] [place], [place] [address]    ECG   N/A  Telemetry   Sinus rhythm- Personally Reviewed  Radiology    ECHOCARDIOGRAM COMPLETE  Result Date: 09/11/2022    ECHOCARDIOGRAM REPORT   Patient Name:   BRITANIA Aylward Date of Exam: 09/11/2022 Medical Rec #:  [id number]      Height:       60.0 in Accession #:    [id number]     Weight:       104.0 lb Date of Birth:  Sep 11, 1945      BSA:          1.414 m Patient Age:    77 years       BP:           114/48 mmHg Patient Gender: F              HR:           103 bpm. Exam Location:  Inpatient Procedure: 2D Echo, Cardiac Doppler, Color Doppler and Strain Analysis Indications:    A-Fib I48.91  History:  Patient has prior history of Echocardiogram examinations, most                 recent 11/13/2012. Chemo therapy, Arrythmias:Atrial Fibrillation;                 Risk Factors:Hypertension, Dyslipidemia and Former Smoker.  Sonographer:    Dondra Prader RVT RCS Referring Phys: [phone] A [name]  Sonographer Comments: Technically challenging study due to limited acoustic windows and Technically difficult study due to poor echo windows. IMPRESSIONS  1. Left ventricular  ejection fraction, by estimation, is 60 to 65%. The left ventricle has normal function. The left ventricle has no regional wall motion abnormalities. Left ventricular diastolic parameters were normal.  2. Right ventricular systolic function is normal. The right ventricular size is normal.  3. The mitral valve is abnormal. No evidence of mitral valve regurgitation. No evidence of mitral stenosis.  4. The aortic valve was not well visualized. There is mild calcification of the aortic valve. There is mild thickening of the aortic valve. Aortic valve regurgitation is trivial. Mild aortic valve stenosis.  5. The inferior vena cava is normal in size with greater than 50% respiratory variability, suggesting right atrial pressure of 3 mmHg. FINDINGS  Left Ventricle: Left ventricular ejection fraction, by estimation, is 60 to 65%. The left ventricle has normal function. The left ventricle has no regional wall motion abnormalities. The left ventricular internal cavity size was normal in size. There is  no left ventricular hypertrophy. Left ventricular diastolic parameters were normal. Right Ventricle: The right ventricular size is normal. No increase in right ventricular wall thickness. Right ventricular systolic function is normal. Left Atrium: Left atrial size was normal in size. Right Atrium: Right atrial size was normal in size. Pericardium: There is no evidence of pericardial effusion. Mitral Valve: The mitral valve is abnormal. There is mild thickening of the mitral valve leaflet(s). No evidence of mitral valve regurgitation. No evidence of mitral valve stenosis. Tricuspid Valve: The tricuspid valve is normal in structure. Tricuspid valve regurgitation is not demonstrated. No evidence of tricuspid stenosis. Aortic Valve: The aortic valve was not well visualized. There is mild calcification of the aortic valve. There is mild thickening of the aortic valve. Aortic valve regurgitation is trivial. Aortic regurgitation PHT  measures 309 msec. Mild aortic stenosis  is present. Aortic valve mean gradient measures 6.0 mmHg. Aortic valve peak gradient measures 11.7 mmHg. Aortic valve area, by VTI measures 1.70 cm. Pulmonic Valve: The pulmonic valve was normal in structure. Pulmonic valve regurgitation is mild. No evidence of pulmonic stenosis. Aorta: The aortic root is normal in size and structure and the ascending aorta was not well visualized. Venous: The inferior vena cava is normal in size with greater than 50% respiratory variability, suggesting right atrial pressure of 3 mmHg. IAS/Shunts: No atrial level shunt detected by color flow Doppler.  LEFT VENTRICLE PLAX 2D LVIDd:         3.50 cm   Diastology LVIDs:         2.30 cm   LV e' medial:    9.57 cm/s LV PW:         0.80 cm   LV E/e' medial:  11.6 LV IVS:        0.80 cm   LV e' lateral:   8.92 cm/s LVOT diam:     1.60 cm   LV E/e' lateral: 12.4 LV SV:         41 LV SV Index:  29 LVOT Area:     2.01 cm  RIGHT VENTRICLE             IVC RV Basal diam:  2.90 cm     IVC diam: 1.90 cm RV S prime:     17.00 cm/s TAPSE (M-mode): 3.0 cm LEFT ATRIUM             Index        RIGHT ATRIUM          Index LA diam:        3.00 cm 2.12 cm/m   RA Area:     9.31 cm LA Vol (A2C):   42.1 ml 29.78 ml/m  RA Volume:   20.90 ml 14.78 ml/m LA Vol (A4C):   36.7 ml 25.96 ml/m LA Biplane Vol: 38.9 ml 27.52 ml/m  AORTIC VALVE                     PULMONIC VALVE AV Area (Vmax):    1.56 cm      PV Vmax:       0.78 m/s AV Area (Vmean):   1.58 cm      PV Peak grad:  2.5 mmHg AV Area (VTI):     1.70 cm AV Vmax:           171.00 cm/s AV Vmean:          115.000 cm/s AV VTI:            0.242 m AV Peak Grad:      11.7 mmHg AV Mean Grad:      6.0 mmHg LVOT Vmax:         133.00 cm/s LVOT Vmean:        90.300 cm/s LVOT VTI:          0.205 m LVOT/AV VTI ratio: 0.85 AI PHT:            309 msec  AORTA Ao Root diam: 2.40 cm MITRAL VALVE MV Area (PHT): 4.83 cm     SHUNTS MV Decel Time: 157 msec     Systemic VTI:   0.20 m MV E velocity: 111.00 cm/s  Systemic Diam: 1.60 cm MV A velocity: 129.00 cm/s MV E/A ratio:  0.86 Charlton Haws MD Electronically signed by Charlton Haws MD Signature Date/Time: 09/11/2022/2:19:22 PM    Final     Cardiac Studies   See echo above  Assessment   Principal Problem:   Pancytopenia (HCC) Active Problems:   MURMUR   Primary small cell carcinoma of lower lobe of left lung (HCC)   New onset atrial fibrillation (HCC)   Hypokalemia   Hypomagnesemia   Plan   Now in sinus rhythm with controlled rate. Will switch to oral amiodarone 200 mg BID for 1 week, then 200 mg daily. On low dose lopressor 12.5 mg BID. Anticoagulation contraindicated d/t anemia and thrombocytopenia. Persistent hypomagnesemia.  Agree with repletion and oral magnesium oxide - may need twice daily. Echo showed mild aortic stenosis - will need to follow.  Will arrange for outpatient cardiology follow-up.  [facility] will sign off.   Medication Recommendations:  as above Other recommendations (labs, testing, etc):  none Follow up as an outpatient:  Dr. Rennis Golden or APP   Time Spent Directly with Patient:  I have spent a total of 25 minutes with the patient reviewing hospital notes, telemetry, EKGs, labs and examining the patient as well as establishing an assessment and plan that was  discussed personally with the patient.  > 50% of time was spent in direct patient care.  Length of Stay:  LOS: 5 days   Chrystie Nose, MD, [facility], FACP  [address]  [facility]  Medical Director of the [facility]  Cardiovascular Risk Reduction Clinic Diplomate of the American Board of Clinical Lipidology Attending Cardiologist  Direct Dial: [phone]  Fax: [phone]  Website:  [email or web address] Nicely Joleena Weisenburger 09/12/2022, 10:06 AM

## 2022-09-12 NOTE — Evaluation (Signed)
Physical Therapy Evaluation Patient Details Name: Sheena [name] MRN: [id number] DOB: 1945/11/22 Today's Date: 09/12/2022  History of Present Illness  Pt is a 77 y/o F presenting to ED on 7/8 with severe symptomatic anemia and thrombocytopenia. PMHx includes HTN, small cell lung CA on chemo and radiation, anxiety  Clinical Impression  Pt admitted with above diagnosis.  Pt currently with functional limitations due to the deficits listed below (see PT Problem List). Pt will benefit from acute skilled PT to increase their independence and safety with mobility to allow discharge.  Pt reports not sleeping well last night but agreeable to ambulate.  Pt requiring min assist to stand but able to ambulate 160 ft in hallway contact guard with RW.  Pt typically does not use assistive device but states she has been feeling unsteady with mobilizing in room during admission.  Pt would benefit from RW and hospital bed upon d/c.  Pt's bedroom is upstairs and spouse reports difficulty with stairs at home and would like to safely keep pt on main level of home.         Assistance Recommended at Discharge PRN  If plan is discharge home, recommend the following:  Can travel by private vehicle  Help with stairs or ramp for entrance        Equipment Recommendations Rolling walker (2 wheels);Hospital bed  Recommendations for Other Services       Functional Status Assessment Patient has had a recent decline in their functional status and demonstrates the ability to make significant improvements in function in a reasonable and predictable amount of time.     Precautions / Restrictions Precautions Precautions: Fall      Mobility  Bed Mobility Overal bed mobility: Needs Assistance Bed Mobility: Supine to Sit, Sit to Supine     Supine to sit: Supervision Sit to supine: Supervision   General bed mobility comments: increased time    Transfers Overall transfer level: Needs assistance Equipment used:  Rolling walker (2 wheels) Transfers: Sit to/from Stand Sit to Stand: Min assist           General transfer comment: verbal cues for hand placement, assist to rise (reports her husband sometimes has to assist her at home)    Ambulation/Gait Ambulation/Gait assistance: Min guard Gait Distance (Feet): 160 Feet Assistive device: Rolling walker (2 wheels) Gait Pattern/deviations: Step-through pattern, Decreased stride length Gait velocity: decr     General Gait Details: verbal cues for RW positioning, posture, distance to tolerance (HR 76 bpm rest and 101 bpm with ambulating)  Stairs            Wheelchair Mobility     Tilt Bed    Modified Rankin (Stroke Patients Only)       Balance Overall balance assessment: Needs assistance             Standing balance comment: static fair, dynamic poor                             Pertinent Vitals/Pain Pain Assessment Pain Assessment: No/denies pain    Home Living Family/patient expects to be discharged to:: Private residence Living Arrangements: Spouse/significant other Available Help at Discharge: Family;Available 24 hours/day Type of Home: House Home Access: Stairs to enter   Entrance Stairs-Number of Steps: 3-4 Alternate Level Stairs-Number of Steps: 14 steps Home Layout: Two level;Bed/bath upstairs Home Equipment: Cane - single point;Rollator (4 wheels)      Prior Function Prior Level  of Function : Independent/Modified Independent             Mobility Comments: no AD ADLs Comments: assist for LB ADL, does not drive     Hand Dominance   Dominant Hand: Right    Extremity/Trunk Assessment        Lower Extremity Assessment Lower Extremity Assessment: Generalized weakness    Cervical / Trunk Assessment Cervical / Trunk Assessment: Kyphotic  Communication   Communication: No difficulties  Cognition Arousal/Alertness: Awake/alert Behavior During Therapy: WFL for tasks  assessed/performed Overall Cognitive Status: Within Functional Limits for tasks assessed                                          General Comments      Exercises     Assessment/Plan    PT Assessment Patient needs continued PT services  PT Problem List Decreased strength;Decreased activity tolerance;Decreased mobility;Decreased balance;Decreased knowledge of use of DME       PT Treatment Interventions DME instruction;Gait training;Balance training;Therapeutic exercise;Functional mobility training;Therapeutic activities;Patient/family education;Stair training    PT Goals (Current goals can be found in the Care Plan section)  Acute Rehab PT Goals PT Goal Formulation: With patient Time For Goal Achievement: 09/26/22 Potential to Achieve Goals: Good    Frequency Min 1X/week     Co-evaluation               AM-PAC PT "6 Clicks" Mobility  Outcome Measure Help needed turning from your back to your side while in a flat bed without using bedrails?: A Little Help needed moving from lying on your back to sitting on the side of a flat bed without using bedrails?: A Little Help needed moving to and from a bed to a chair (including a wheelchair)?: A Little Help needed standing up from a chair using your arms (e.g., wheelchair or bedside chair)?: A Little Help needed to walk in hospital room?: A Little Help needed climbing 3-5 steps with a railing? : A Lot 6 Click Score: 17    End of Session Equipment Utilized During Treatment: Gait belt Activity Tolerance: Patient tolerated treatment well Patient left: in bed;with call bell/phone within reach   PT Visit Diagnosis: Difficulty in walking, not elsewhere classified (R26.2);Muscle weakness (generalized) (M62.81)    Time: 4098-1191 PT Time Calculation (min) (ACUTE ONLY): 16 min   Charges:   PT Evaluation $PT Eval Low Complexity: 1 Low   PT General Charges $$ ACUTE PT VISIT: 1 Visit       Thomasene Mohair PT,  DPT Physical Therapist Acute Rehabilitation Services Office: [phone]   Sheena [name] 09/12/2022, 3:55 PM

## 2022-09-12 NOTE — Plan of Care (Signed)
  Problem: Education: Goal: Knowledge of General Education information will improve Description: Including pain rating scale, medication(s)/side effects and non-pharmacologic comfort measures Outcome: Progressing   Problem: Clinical Measurements: Goal: Will remain free from infection Outcome: Progressing   Problem: Activity: Goal: Risk for activity intolerance will decrease Outcome: Progressing   Problem: Nutrition: Goal: Adequate nutrition will be maintained Outcome: Progressing   Problem: Coping: Goal: Level of anxiety will decrease Outcome: Progressing   Problem: Pain Managment: Goal: General experience of comfort will improve Outcome: Progressing   

## 2022-09-13 DIAGNOSIS — D61818 Other pancytopenia: Secondary | ICD-10-CM | POA: Diagnosis not present

## 2022-09-13 LAB — COMPREHENSIVE METABOLIC PANEL
ALT: 10 U/L (ref 0–44)
AST: 10 U/L — ABNORMAL LOW (ref 15–41)
Albumin: 2.9 g/dL — ABNORMAL LOW (ref 3.5–5.0)
Alkaline Phosphatase: 60 U/L (ref 38–126)
Anion gap: 9 (ref 5–15)
BUN: 10 mg/dL (ref 8–23)
CO2: 28 mmol/L (ref 22–32)
Calcium: 8.5 mg/dL — ABNORMAL LOW (ref 8.9–10.3)
Chloride: 100 mmol/L (ref 98–111)
Creatinine, Ser: 0.79 mg/dL (ref 0.44–1.00)
GFR, Estimated: >60 mL/min (ref >60)
Glucose, Bld: 98 mg/dL (ref 70–99)
Potassium: 3.6 mmol/L (ref 3.5–5.1)
Sodium: 137 mmol/L (ref 135–145)
Total Bilirubin: 0.8 mg/dL (ref 0.3–1.2)
Total Protein: 5.6 g/dL — ABNORMAL LOW (ref 6.5–8.1)

## 2022-09-13 LAB — CBC WITH DIFFERENTIAL/PLATELET
Abs Immature Granulocytes: 0.22 10*3/uL — ABNORMAL HIGH (ref 0.00–0.07)
Basophils Absolute: 0 10*3/uL (ref 0.0–0.1)
Basophils Relative: 1 %
Eosinophils Absolute: 0 10*3/uL (ref 0.0–0.5)
Eosinophils Relative: 1 %
HCT: 27.2 % — ABNORMAL LOW (ref 36.0–46.0)
Hemoglobin: 8.9 g/dL — ABNORMAL LOW (ref 12.0–15.0)
Immature Granulocytes: 6 %
Lymphocytes Relative: 10 %
Lymphs Abs: 0.3 10*3/uL — ABNORMAL LOW (ref 0.7–4.0)
MCH: 31.8 pg (ref 26.0–34.0)
MCHC: 32.7 g/dL (ref 30.0–36.0)
MCV: 97.1 fL (ref 80.0–100.0)
Monocytes Absolute: 0.5 10*3/uL (ref 0.1–1.0)
Monocytes Relative: 15 %
Neutro Abs: 2.4 10*3/uL (ref 1.7–7.7)
Neutrophils Relative %: 67 %
Platelets: 26 10*3/uL — CL (ref 150–400)
RBC: 2.8 MIL/uL — ABNORMAL LOW (ref 3.87–5.11)
RDW: 14.2 % (ref 11.5–15.5)
WBC: 3.5 10*3/uL — ABNORMAL LOW (ref 4.0–10.5)
nRBC: 0 % (ref 0.0–0.2)

## 2022-09-13 LAB — PHOSPHORUS: Phosphorus: 6.1 mg/dL — ABNORMAL HIGH (ref 2.5–4.6)

## 2022-09-13 LAB — MAGNESIUM: Magnesium: 2 mg/dL (ref 1.7–2.4)

## 2022-09-13 NOTE — Progress Notes (Signed)
Mobility Specialist - Progress Note   09/13/22 1102  Mobility  Activity Ambulated with assistance in hallway  Level of Assistance Standby assist, set-up cues, supervision of patient - no hands on  Assistive Device Front wheel walker  Distance Ambulated (ft) 160 ft  Range of Motion/Exercises Active  Activity Response Tolerated well  Mobility Referral Yes  $Mobility charge 1 Mobility  Mobility Specialist Start Time (ACUTE ONLY) 1045  Mobility Specialist Stop Time (ACUTE ONLY) 1055  Mobility Specialist Time Calculation (min) (ACUTE ONLY) 10 min   Pt received in bed and agreed to mobility. Was standby throughout entire session. Some fatigue in legs nearing EOS, returned to bed with all needs met.  Marilynne Halsted Mobility Specialist

## 2022-09-13 NOTE — Plan of Care (Signed)

## 2022-09-13 NOTE — Plan of Care (Signed)
  Problem: Education: Goal: Knowledge of General Education information will improve Description Including pain rating scale, medication(s)/side effects and non-pharmacologic comfort measures Outcome: Progressing   

## 2022-09-13 NOTE — Progress Notes (Signed)
PROGRESS NOTE    Sheena [name]  ZOX:[id number] DOB: Jul 26, 1945 DOA: 09/07/2022 PCP: [name], MD  Chief Complaint  Patient presents with   Weakness    Brief Narrative:   77 y.o. female with medical history significant for hypertension, small cell lung cancer on chemo and radiation being admitted to the hospital with severe symptomatic anemia as well as thrombocytopenia  Her last radiation was 7/5, and her last chemotherapy was Calder Oblinger week before that.  Found to be relatively hypotensive, and lab work revealed hemoglobin 4.7, WBC 1.1, platelets less than 5, negative troponin x 2, creatinine up to 1.23 from normal baseline. Potassium 3.0, magnesium 1.5. She was given IV magnesium, IV potassium, and orders have been placed for 2 unit blood transfusion and 1 unit of packed platelets   Assessment & Plan:   Principal Problem:   Pancytopenia (HCC) Active Problems:   MURMUR   Primary small cell carcinoma of lower lobe of left lung (HCC)   New onset atrial fibrillation (HCC)   Hypokalemia   Hypomagnesemia  Atrial Fibrillation with RVR - appreciate cardiology recs -- continuing amio another 24 hrs - echo (EF 60-65%, no RWMA, EKG (fib with RVR) - continue telemetry monitoring  - appreciate Dr. Blanchie Dessert assistance -> plan for amiodarone 200 mg BID x1 week, then 200 mg daily  Chemotherapy-induced pancytopenia -s/p Granix -ANC recovered  -s/p 3 units pRBC.  Transfuse to keep hemoglobin greater than 8.0 (trend). -s/p 4 units platelets- > transfuse for < 20,000  -platelets downtrending, if stable 7/15, will consider discharge   Small cell lung cancer -S/p 3 cycles of chemotherapy and radiation treatment -Cycle #4 of chemotherapy as outpatient per Dr. Arbutus Ped (plan to delay by at least 1 week and dose of chemo maybe reduced)   Mild Aortic Stenosis - needs outpatient follow up   Hypertension -metop -norvasc on hold   Chronic pain -Continue Ultram as needed    Hypokalemia -improved   Hypomagnesemia -improved     DVT prophylaxis: SCD Code Status: full Family Communication: none Disposition:   Status is: Inpatient Remains inpatient appropriate because: continued need for inpatient care, pancytopenia   Consultants:  oncology  Procedures:  none  Antimicrobials:  Anti-infectives (From admission, onward)    None       Subjective: No complaints today  Objective: Vitals:   09/12/22 0503 09/12/22 1323 09/12/22 2147 09/13/22 0939  BP: (!) 119/49 (!) 116/54 (!) 112/54   Pulse: 92 79 87   Resp: 18 18 18 19   Temp: 98.1 F (36.7 C) (!) 97.5 F (36.4 C) 98 F (36.7 C)   TempSrc:  Oral Oral   SpO2: 99% 99% 100%   Weight:      Height:        Intake/Output Summary (Last 24 hours) at 09/13/2022 1319 Last data filed at 09/13/2022 0930 Gross per 24 hour  Intake 480 ml  Output --  Net 480 ml   Filed Weights   09/07/22 1216  Weight: 47.2 kg    Examination:  General: No acute distress. Cardiovascular: RRR Lungs: unlabored Neurological: Alert and oriented 3. Moves all extremities 4 with equal strength. Cranial nerves II through XII grossly intact. Extremities: No clubbing or cyanosis. No edema.   Data Reviewed: I have personally reviewed following labs and imaging studies  CBC: Recent Labs  Lab 09/09/22 0842 09/10/22 1037 09/11/22 0418 09/12/22 0318 09/13/22 0246  WBC 4.1 3.8* 4.5 5.8 3.5*  NEUTROABS 3.4 2.7 3.1 4.7 2.4  HGB 8.8*  8.0* 7.3* 8.8* 8.9*  HCT 26.5* 24.3* 22.7* 26.3* 27.2*  MCV 94.0 95.3 96.2 98.9 97.1  PLT 7* 49* 24* 39* 26*    Basic Metabolic Panel: Recent Labs  Lab 09/08/22 0856 09/09/22 0002 09/10/22 1037 09/11/22 0418 09/12/22 0318 09/13/22 0246  NA 137 137 133* 133* 134* 137  K 2.7* 3.5 2.8* 5.1 4.0 3.6  CL 94* 100 97* 102 102 100  CO2 31 30 27 26 27 28   GLUCOSE 119* 101* 103* 99 103* 98  BUN 39* 25* 13 12 14 10   CREATININE 0.86 0.69 0.67 0.66 0.91 0.79  CALCIUM 8.2* 8.3*  8.3* 8.5* 8.6* 8.5*  MG 2.1  --  1.4* 2.0 1.4* 2.0  PHOS  --   --  2.5  --  1.9* 6.1*    GFR: Estimated Creatinine Clearance: 43.6 mL/min (by C-G formula based on SCr of 0.79 mg/dL).  Liver Function Tests: Recent Labs  Lab 09/07/22 1303 09/10/22 1037 09/12/22 0318 09/13/22 0246  AST 9* 10* 11* 10*  ALT 11 8 8 10   ALKPHOS 53 53 58 60  BILITOT 1.9* 1.0 1.4* 0.8  PROT 6.7 5.8* 5.5* 5.6*  ALBUMIN 3.3* 3.0* 2.9* 2.9*    CBG: Recent Labs  Lab 09/07/22 1335  GLUCAP 115*     No results found for this or any previous visit (from the past 240 hour(s)).       Radiology Studies: ECHOCARDIOGRAM COMPLETE  Result Date: 09/11/2022    ECHOCARDIOGRAM REPORT   Patient Name:   Sheena [name] Date of Exam: 09/11/2022 Medical Rec #:  [id number]      Height:       60.0 in Accession #:    [id number]     Weight:       104.0 lb Date of Birth:  [birth year]      BSA:          1.414 m Patient Age:    77 years       BP:           114/48 mmHg Patient Gender: F              HR:           103 bpm. Exam Location:  Inpatient Procedure: 2D Echo, Cardiac Doppler, Color Doppler and Strain Analysis Indications:    Angelette Ganus-Fib I48.91  History:        Patient has prior history of Echocardiogram examinations, most                 recent 11/13/2012. Chemo therapy, Arrythmias:Atrial Fibrillation;                 Risk Factors:Hypertension, Dyslipidemia and Former Smoker.  Sonographer:    Dondra Prader RVT RCS Referring Phys: [id number] Coti Burd [name]  Sonographer Comments: Technically challenging study due to limited acoustic windows and Technically difficult study due to poor echo windows. IMPRESSIONS  1. Left ventricular ejection fraction, by estimation, is 60 to 65%. The left ventricle has normal function. The left ventricle has no regional wall motion abnormalities. Left ventricular diastolic parameters were normal.  2. Right ventricular systolic function is normal. The right ventricular size is normal.  3. The mitral valve is  abnormal. No evidence of mitral valve regurgitation. No evidence of mitral stenosis.  4. The aortic valve was not well visualized. There is mild calcification of the aortic valve. There is mild thickening of the aortic valve. Aortic valve regurgitation is trivial. Mild aortic valve  stenosis.  5. The inferior vena cava is normal in size with greater than 50% respiratory variability, suggesting right atrial pressure of 3 mmHg. FINDINGS  Left Ventricle: Left ventricular ejection fraction, by estimation, is 60 to 65%. The left ventricle has normal function. The left ventricle has no regional wall motion abnormalities. The left ventricular internal cavity size was normal in size. There is  no left ventricular hypertrophy. Left ventricular diastolic parameters were normal. Right Ventricle: The right ventricular size is normal. No increase in right ventricular wall thickness. Right ventricular systolic function is normal. Left Atrium: Left atrial size was normal in size. Right Atrium: Right atrial size was normal in size. Pericardium: There is no evidence of pericardial effusion. Mitral Valve: The mitral valve is abnormal. There is mild thickening of the mitral valve leaflet(s). No evidence of mitral valve regurgitation. No evidence of mitral valve stenosis. Tricuspid Valve: The tricuspid valve is normal in structure. Tricuspid valve regurgitation is not demonstrated. No evidence of tricuspid stenosis. Aortic Valve: The aortic valve was not well visualized. There is mild calcification of the aortic valve. There is mild thickening of the aortic valve. Aortic valve regurgitation is trivial. Aortic regurgitation PHT measures 309 msec. Mild aortic stenosis  is present. Aortic valve mean gradient measures 6.0 mmHg. Aortic valve peak gradient measures 11.7 mmHg. Aortic valve area, by VTI measures 1.70 cm. Pulmonic Valve: The pulmonic valve was normal in structure. Pulmonic valve regurgitation is mild. No evidence of pulmonic  stenosis. Aorta: The aortic root is normal in size and structure and the ascending aorta was not well visualized. Venous: The inferior vena cava is normal in size with greater than 50% respiratory variability, suggesting right atrial pressure of 3 mmHg. IAS/Shunts: No atrial level shunt detected by color flow Doppler.  LEFT VENTRICLE PLAX 2D LVIDd:         3.50 cm   Diastology LVIDs:         2.30 cm   LV e' medial:    9.57 cm/s LV PW:         0.80 cm   LV E/e' medial:  11.6 LV IVS:        0.80 cm   LV e' lateral:   8.92 cm/s LVOT diam:     1.60 cm   LV E/e' lateral: 12.4 LV SV:         41 LV SV Index:   29 LVOT Area:     2.01 cm  RIGHT VENTRICLE             IVC RV Basal diam:  2.90 cm     IVC diam: 1.90 cm RV S prime:     17.00 cm/s TAPSE (M-mode): 3.0 cm LEFT ATRIUM             Index        RIGHT ATRIUM          Index LA diam:        3.00 cm 2.12 cm/m   RA Area:     9.31 cm LA Vol (A2C):   42.1 ml 29.78 ml/m  RA Volume:   20.90 ml 14.78 ml/m LA Vol (A4C):   36.7 ml 25.96 ml/m LA Biplane Vol: 38.9 ml 27.52 ml/m  AORTIC VALVE                     PULMONIC VALVE AV Area (Vmax):    1.56 cm      PV Vmax:  0.78 m/s AV Area (Vmean):   1.58 cm      PV Peak grad:  2.5 mmHg AV Area (VTI):     1.70 cm AV Vmax:           171.00 cm/s AV Vmean:          115.000 cm/s AV VTI:            0.242 m AV Peak Grad:      11.7 mmHg AV Mean Grad:      6.0 mmHg LVOT Vmax:         133.00 cm/s LVOT Vmean:        90.300 cm/s LVOT VTI:          0.205 m LVOT/AV VTI ratio: 0.85 AI PHT:            309 msec  AORTA Ao Root diam: 2.40 cm MITRAL VALVE MV Area (PHT): 4.83 cm     SHUNTS MV Decel Time: 157 msec     Systemic VTI:  0.20 m MV E velocity: 111.00 cm/s  Systemic Diam: 1.60 cm MV Janya Eveland velocity: 129.00 cm/s MV E/Jamari Moten ratio:  0.86 Charlton Haws MD Electronically signed by Charlton Haws MD Signature Date/Time: 09/11/2022/2:19:22 PM    Final         Scheduled Meds:  sodium chloride   Intravenous Once   amiodarone  200 mg Oral BID    Chlorhexidine Gluconate Cloth  6 each Topical Daily   magnesium oxide  400 mg Oral Daily   metoprolol tartrate  12.5 mg Oral BID   sucralfate  1 g Oral TID WC & HS   Continuous Infusions:     LOS: 6 days    Time spent: over 30 min    Lacretia Nicks, MD [facility]   To contact the attending provider between 7A-7P or the covering provider during after hours 7P-7A, please log into the web site [email or web address] and access using universal [place] password for that web site. If you do not have the password, please call the hospital operator.  09/13/2022, 1:19 PM

## 2022-09-14 ENCOUNTER — Ambulatory Visit: Payer: 59

## 2022-09-14 DIAGNOSIS — D61818 Other pancytopenia: Secondary | ICD-10-CM | POA: Diagnosis not present

## 2022-09-14 LAB — CBC WITH DIFFERENTIAL/PLATELET
Abs Immature Granulocytes: 0.06 10*3/uL (ref 0.00–0.07)
Basophils Absolute: 0 10*3/uL (ref 0.0–0.1)
Basophils Relative: 1 %
Eosinophils Absolute: 0 10*3/uL (ref 0.0–0.5)
Eosinophils Relative: 0 %
HCT: 24.9 % — ABNORMAL LOW (ref 36.0–46.0)
Hemoglobin: 8.2 g/dL — ABNORMAL LOW (ref 12.0–15.0)
Immature Granulocytes: 2 %
Lymphocytes Relative: 14 %
Lymphs Abs: 0.5 10*3/uL — ABNORMAL LOW (ref 0.7–4.0)
MCH: 31.5 pg (ref 26.0–34.0)
MCHC: 32.9 g/dL (ref 30.0–36.0)
MCV: 95.8 fL (ref 80.0–100.0)
Monocytes Absolute: 0.5 10*3/uL (ref 0.1–1.0)
Monocytes Relative: 15 %
Neutro Abs: 2.4 10*3/uL (ref 1.7–7.7)
Neutrophils Relative %: 68 %
Platelets: 23 10*3/uL — CL (ref 150–400)
RBC: 2.6 MIL/uL — ABNORMAL LOW (ref 3.87–5.11)
RDW: 14.1 % (ref 11.5–15.5)
WBC: 3.5 10*3/uL — ABNORMAL LOW (ref 4.0–10.5)
nRBC: 0 % (ref 0.0–0.2)

## 2022-09-14 LAB — COMPREHENSIVE METABOLIC PANEL
ALT: 9 U/L (ref 0–44)
AST: 9 U/L — ABNORMAL LOW (ref 15–41)
Albumin: 2.7 g/dL — ABNORMAL LOW (ref 3.5–5.0)
Alkaline Phosphatase: 59 U/L (ref 38–126)
Anion gap: 6 (ref 5–15)
BUN: 9 mg/dL (ref 8–23)
CO2: 29 mmol/L (ref 22–32)
Calcium: 8.4 mg/dL — ABNORMAL LOW (ref 8.9–10.3)
Chloride: 100 mmol/L (ref 98–111)
Creatinine, Ser: 0.71 mg/dL (ref 0.44–1.00)
GFR, Estimated: >60 mL/min (ref >60)
Glucose, Bld: 89 mg/dL (ref 70–99)
Potassium: 3.1 mmol/L — ABNORMAL LOW (ref 3.5–5.1)
Sodium: 135 mmol/L (ref 135–145)
Total Bilirubin: 1 mg/dL (ref 0.3–1.2)
Total Protein: 5.3 g/dL — ABNORMAL LOW (ref 6.5–8.1)

## 2022-09-14 LAB — PHOSPHORUS: Phosphorus: 4.2 mg/dL (ref 2.5–4.6)

## 2022-09-14 LAB — MAGNESIUM: Magnesium: 1.5 mg/dL — ABNORMAL LOW (ref 1.7–2.4)

## 2022-09-14 MED ORDER — AMIODARONE HCL 200 MG PO TABS: ORAL_TABLET | ORAL | 0 refills | Status: DC

## 2022-09-14 MED ORDER — POTASSIUM CHLORIDE CRYS ER 10 MEQ PO TBCR: 20.0000 meq | EXTENDED_RELEASE_TABLET | Freq: Every day | ORAL | 0 refills | Status: DC

## 2022-09-14 MED ORDER — HEPARIN SOD (PORK) LOCK FLUSH 100 UNIT/ML IV SOLN
500.0000 [IU] | INTRAVENOUS | Status: AC | PRN
  Administered 2022-09-14: 500 [IU]

## 2022-09-14 MED ORDER — POTASSIUM CHLORIDE CRYS ER 20 MEQ PO TBCR
40.0000 meq | EXTENDED_RELEASE_TABLET | ORAL | Status: AC
  Administered 2022-09-14 (×2): 40 meq via ORAL
  Filled 2022-09-14 (×2): qty 2

## 2022-09-14 MED ORDER — METOPROLOL TARTRATE 25 MG PO TABS: 12.5000 mg | ORAL_TABLET | Freq: Two times a day (BID) | ORAL | 1 refills | Status: DC

## 2022-09-14 MED ORDER — MAGNESIUM SULFATE 4 GM/100ML IV SOLN
4.0000 g | Freq: Once | INTRAVENOUS | Status: AC
  Administered 2022-09-14: 4 g via INTRAVENOUS
  Filled 2022-09-14: qty 100

## 2022-09-14 MED ORDER — MAGNESIUM OXIDE -MG SUPPLEMENT 400 (240 MG) MG PO TABS: 400.0000 mg | ORAL_TABLET | Freq: Two times a day (BID) | ORAL | 0 refills | Status: DC

## 2022-09-14 MED FILL — Fosaprepitant Dimeglumine For IV Infusion 150 MG (Base Eq): INTRAVENOUS | Qty: 5 | Status: AC

## 2022-09-14 MED FILL — Dexamethasone Sodium Phosphate Inj 100 MG/10ML: INTRAMUSCULAR | Qty: 1 | Status: AC

## 2022-09-14 NOTE — TOC CM/SW Note (Signed)
Patient suffers from small cell carcinoma of lower loe of left lung and has trouble breathing at night when head is elevated less than _________30_____________ degrees. Bed wedges do not provide enough elevation to resolve breathing issues. Patient to requires frequent changes in body  position which cannot be achieved with a normal bed.

## 2022-09-14 NOTE — Progress Notes (Signed)
Mobility Specialist - Progress Note  Pre-mobility: 84 bpm HR,  During mobility: 101 bpm HR,  Post-mobility: 72 bpm  HR,    09/14/22 1044  Mobility  Activity Ambulated with assistance in hallway  Level of Assistance Standby assist, set-up cues, supervision of patient - no hands on  Assistive Device Front wheel walker  Distance Ambulated (ft) 150 ft  Range of Motion/Exercises Active  Activity Response Tolerated well  Mobility Referral Yes  $Mobility charge 1 Mobility  Mobility Specialist Start Time (ACUTE ONLY) 1030  Mobility Specialist Stop Time (ACUTE ONLY) 1044  Mobility Specialist Time Calculation (min) (ACUTE ONLY) 14 min   Pt was found in bed and agreeable to ambulate. Stated feeling tired throughout session and was fatigued by EOS. Was left in bed with all needs met and call bell in reach.  Billey Chang Mobility Specialist

## 2022-09-14 NOTE — Discharge Summary (Addendum)
Physician Discharge Summary  Tannie Koskela JYN:[id number] DOB: October 13, 1945 DOA: 09/07/2022  PCP: [name], MD  Admit date: 09/07/2022 Discharge date: 09/14/2022  Time spent: 40 minutes  Recommendations for Outpatient Follow-up:  Follow outpatient CBC/CMP  Follow with oncology outpatient Follow with cardiology outpatient for new atrial fib and mild aortic stenosis   Discharge Diagnoses:  Principal Problem:   Pancytopenia (HCC) Active Problems:   MURMUR   Primary small cell carcinoma of lower lobe of left lung (HCC)   New onset atrial fibrillation (HCC)   Hypokalemia   Hypomagnesemia   Discharge Condition: stable  Diet recommendation: heart healthy  Filed Weights   09/07/22 1216  Weight: 47.2 kg    History of present illness:   77 y.o. female with medical history significant for hypertension, small cell lung cancer on chemo and radiation being admitted to the hospital with severe symptomatic anemia as well as thrombocytopenia.  She was admitted for chemotherapy induced pancytopenia.  She's now s/p granix and 3 units pRBC and 4 units platelets.  Counts are stable today on day of discharge.  Plan for outpatient oncology follow up.  Hospitalization complicated by atrial fibrillation with RVR, now on amiodarone and metop.  See below for additional details  Hospital Course:  Assessment and Plan:  Atrial Fibrillation with RVR - appreciate cardiology recs -- continuing amio another 24 hrs - echo (EF 60-65%, no RWMA, EKG (fib with RVR) - continue telemetry monitoring  - appreciate Dr. Blanchie Dessert assistance -> plan for amiodarone 200 mg BID x1 week, then 200 mg daily  - metoprolol 12.5 mg twice daily - not currently candidate for anticoagulation with thrombocytopenia   Chemotherapy-induced pancytopenia -s/p Granix -ANC recovered  -s/p 3 units pRBC.  Transfuse to keep hemoglobin greater than 8.0 (trend). -s/p 4 units platelets- > transfuse for < 20,000  -ok for discharge,  discussed with Dr. Arbutus Ped who will follow outpatient   Small cell lung cancer -S/p 3 cycles of chemotherapy and radiation treatment -Cycle #4 of chemotherapy as outpatient per Dr. Arbutus Ped (plan to delay by at least 1 week and dose of chemo maybe reduced)   Mild Aortic Stenosis - needs outpatient follow up with cards   Hypertension -metop -norvasc on hold   Chronic pain -Continue Ultram as needed   Hypokalemia -improved   Hypomagnesemia -improved   due to small cell lung cancer patient requires frequent changes in body position and/or has immediate need for change in body position .  The patient has Wymon Swaney medical condition which requires positioning of the the body in ways not feasible with an ordinary bed.  Hospital bed ordered for home.       Procedures: Echo IMPRESSIONS     1. Left ventricular ejection fraction, by estimation, is 60 to 65%. The  left ventricle has normal function. The left ventricle has no regional  wall motion abnormalities. Left ventricular diastolic parameters were  normal.   2. Right ventricular systolic function is normal. The right ventricular  size is normal.   3. The mitral valve is abnormal. No evidence of mitral valve  regurgitation. No evidence of mitral stenosis.   4. The aortic valve was not well visualized. There is mild calcification  of the aortic valve. There is mild thickening of the aortic valve. Aortic  valve regurgitation is trivial. Mild aortic valve stenosis.   5. The inferior vena cava is normal in size with greater than 50%  respiratory variability, suggesting right atrial pressure of 3 mmHg.  Consultations: Oncology cardiology  Discharge Exam: Vitals:   09/14/22 0725 09/14/22 1310  BP: (!) 119/55 (!) 99/49  Pulse: 81 85  Resp: 18 16  Temp: 98.3 F (36.8 C) 98.5 F (36.9 C)  SpO2: 96% 100%   Feels tired today Worried about her counts  General: No acute distress. Cardiovascular: RRR Lungs: unlabored Abdomen:  Soft, nontender, nondistended  Neurological: Alert and oriented 3. Moves all extremities 4 with equal strength. Cranial nerves II through XII grossly intact. Extremities: No clubbing or cyanosis. No edema.  Discharge Instructions   Discharge Instructions     Call MD for:  difficulty breathing, headache or visual disturbances   Complete by: As directed    Call MD for:  extreme fatigue   Complete by: As directed    Call MD for:  hives   Complete by: As directed    Call MD for:  persistant dizziness or light-headedness   Complete by: As directed    Call MD for:  persistant nausea and vomiting   Complete by: As directed    Call MD for:  redness, tenderness, or signs of infection (pain, swelling, redness, odor or green/yellow discharge around incision site)   Complete by: As directed    Call MD for:  severe uncontrolled pain   Complete by: As directed    Call MD for:  temperature >100.4   Complete by: As directed    Diet - low sodium heart healthy   Complete by: As directed    Discharge instructions   Complete by: As directed    You were seen for pancytopenia (low blood counts).  These have improved with granix and supportive care as well as transfusions as needed.  You received 3 units of blood and 4 units of platelets.  Your counts are stable today, though still low.  Please follow up with Dr. Arbutus Ped as an outpatient for repeat checks and to plan your future cancer treatment.  Stop your aspirin due to your low platelets.  Avoid NSAIDs due to your low platelets.  Your hospitalization was complicated by atrial fibrillation (an irregular, fast heart rate).  We've started you on amiodarone and adjusted your metoprolol dose.  Please follow up with cardiology as an outpatient.  Your magnesium and potassium are low.  These will need to be repeated as an outpatient with your PCP.  Return for new, recurrent, or worsening symptoms.  Please ask your PCP to request records from this  hospitalization so they know what was done and what the next steps will be.   Increase activity slowly   Complete by: As directed    Clinic Appointment Request   Complete by: Sep 22, 2022    Contact your oncology clinic or infusion center to schedule this appointment.   Infusion Appointment Request   Complete by: Sep 22, 2022    Contact your oncology clinic or infusion center to schedule this appointment.   Lab Appointment Request   Complete by: Sep 22, 2022    Please do not schedule [facility] regular Lab appointment for this patient.  Patient's lab work will be drawn from their [place] [facility]-Cath or PICC Line, and Demarie Uhlig [employer] with Lab appointment is required.      Allergies as of 09/14/2022       Reactions   Antihistamines, Diphenhydramine-type Other (See Comments)   palpitations, feel jittery   Aspirin Other (See Comments)   Stomach cramps   Evista [raloxifene] Other (See Comments)   Hot Flashes  Medication List     STOP taking these medications    amLODipine 5 MG tablet Commonly known as: NORVASC   aspirin EC 81 MG tablet   furosemide 40 MG tablet Commonly known as: LASIX       TAKE these medications    acetaminophen 500 MG tablet Commonly known as: TYLENOL Take 1,000 mg by mouth 2 (two) times daily as needed for mild pain.   amiodarone 200 MG tablet Commonly known as: PACERONE Take 1 tablet (200 mg total) by mouth 2 (two) times daily for 5 days, THEN 1 tablet (200 mg total) daily. Follow up with cardiology outpatient for refills. Start taking on: September 14, 2022   atorvastatin 20 MG tablet Commonly known as: LIPITOR TAKE 1 TABLET BY MOUTH EVERY DAY What changed: when to take this   CALCIUM CITRATE PETITE/VIT D PO Take 1 tablet by mouth 2 (two) times daily.   cyclobenzaprine 5 MG tablet Commonly known as: FLEXERIL TAKE 1 TABLET BY MOUTH TWICE Alayah Knouff DAY AS NEEDED FOR MUSCLE SPASM What changed:  how much to take how to take this when to take this reasons to  take this additional instructions   famotidine 20 MG tablet Commonly known as: PEPCID Take 20 mg by mouth daily.   lidocaine-prilocaine cream Commonly known as: EMLA Apply 1 Application topically as needed. What changed: reasons to take this   magnesium oxide 400 (240 Mg) MG tablet Commonly known as: MAG-OX Take 1 tablet (400 mg total) by mouth 2 (two) times daily. Follow your magnesium level outpatient with your PCP   metoprolol tartrate 25 MG tablet Commonly known as: LOPRESSOR Take 0.5 tablets (12.5 mg total) by mouth 2 (two) times daily. What changed:  medication strength how much to take   potassium chloride 10 MEQ tablet Commonly known as: KLOR-CON M Take 2 tablets (20 mEq total) by mouth daily for 14 days. Follow repeat labs with your PCP within 1 week   PRESCRIPTION MEDICATION Apply 1 application  topically in the morning and at bedtime. Unknown cream given to Pt from the cancer center for after radiation   prochlorperazine 10 MG tablet Commonly known as: COMPAZINE Take 1 tablet (10 mg total) by mouth every 6 (six) hours as needed for nausea or vomiting.   sucralfate 1 GM/10ML suspension Commonly known as: Carafate Take 10 mLs (1 g total) by mouth 4 (four) times daily -  with meals and at bedtime. What changed: when to take this   traMADol 50 MG tablet Commonly known as: ULTRAM Take 0.5-1 tablets (25-50 mg total) by mouth 2 (two) times daily as needed. For chronic low back pain. May refill monthly What changed:  how much to take when to take this reasons to take this additional instructions               Durable Medical Equipment  (From admission, onward)           Start     Ordered   09/13/22 1319  For home use only DME Hospital bed  Once       Question Answer Comment  Length of Need 12 Months   The above medical condition requires: Patient requires the ability to reposition frequently   Bed type Semi-electric      09/13/22 1318            Allergies  Allergen Reactions   Antihistamines, Diphenhydramine-Type Other (See Comments)    palpitations, feel jittery   Aspirin Other (See Comments)  Stomach cramps   Evista [Raloxifene] Other (See Comments)    Hot Flashes     Follow-up Information     [facility], [facility] Follow up.   Specialty: Home Health Services Why: Home Health Physical and Occupational Therapy will contact you 24hrs to 48hrs after discharge. Contact information: [address] [place] [address] [phone]                  The results of significant diagnostics from this hospitalization (including imaging, microbiology, ancillary and laboratory) are listed below for reference.    Significant Diagnostic Studies: ECHOCARDIOGRAM COMPLETE  Result Date: 09/11/2022    ECHOCARDIOGRAM REPORT   Patient Name:   Sheena [name] Date of Exam: 09/11/2022 Medical Rec #:  [id number]      Height:       60.0 in Accession #:    [id number]     Weight:       104.0 lb Date of Birth:  Jul 26, 1945      BSA:          1.414 m Patient Age:    77 years       BP:           114/48 mmHg Patient Gender: F              HR:           103 bpm. Exam Location:  Inpatient Procedure: 2D Echo, Cardiac Doppler, Color Doppler and Strain Analysis Indications:    Muriah Harsha-Fib I48.91  History:        Patient has prior history of Echocardiogram examinations, most                 recent 11/13/2012. Chemo therapy, Arrythmias:Atrial Fibrillation;                 Risk Factors:Hypertension, Dyslipidemia and Former Smoker.  Sonographer:    Dondra Prader RVT RCS Referring Phys: [phone] Ashla Murph [name]  Sonographer Comments: Technically challenging study due to limited acoustic windows and Technically difficult study due to poor echo windows. IMPRESSIONS  1. Left ventricular ejection fraction, by estimation, is 60 to 65%. The left ventricle has normal function. The left ventricle has no regional wall motion abnormalities. Left ventricular  diastolic parameters were normal.  2. Right ventricular systolic function is normal. The right ventricular size is normal.  3. The mitral valve is abnormal. No evidence of mitral valve regurgitation. No evidence of mitral stenosis.  4. The aortic valve was not well visualized. There is mild calcification of the aortic valve. There is mild thickening of the aortic valve. Aortic valve regurgitation is trivial. Mild aortic valve stenosis.  5. The inferior vena cava is normal in size with greater than 50% respiratory variability, suggesting right atrial pressure of 3 mmHg. FINDINGS  Left Ventricle: Left ventricular ejection fraction, by estimation, is 60 to 65%. The left ventricle has normal function. The left ventricle has no regional wall motion abnormalities. The left ventricular internal cavity size was normal in size. There is  no left ventricular hypertrophy. Left ventricular diastolic parameters were normal. Right Ventricle: The right ventricular size is normal. No increase in right ventricular wall thickness. Right ventricular systolic function is normal. Left Atrium: Left atrial size was normal in size. Right Atrium: Right atrial size was normal in size. Pericardium: There is no evidence of pericardial effusion. Mitral Valve: The mitral valve is abnormal. There is mild thickening of the mitral valve leaflet(s). No evidence  of mitral valve regurgitation. No evidence of mitral valve stenosis. Tricuspid Valve: The tricuspid valve is normal in structure. Tricuspid valve regurgitation is not demonstrated. No evidence of tricuspid stenosis. Aortic Valve: The aortic valve was not well visualized. There is mild calcification of the aortic valve. There is mild thickening of the aortic valve. Aortic valve regurgitation is trivial. Aortic regurgitation PHT measures 309 msec. Mild aortic stenosis  is present. Aortic valve mean gradient measures 6.0 mmHg. Aortic valve peak gradient measures 11.7 mmHg. Aortic valve area, by  VTI measures 1.70 cm. Pulmonic Valve: The pulmonic valve was normal in structure. Pulmonic valve regurgitation is mild. No evidence of pulmonic stenosis. Aorta: The aortic root is normal in size and structure and the ascending aorta was not well visualized. Venous: The inferior vena cava is normal in size with greater than 50% respiratory variability, suggesting right atrial pressure of 3 mmHg. IAS/Shunts: No atrial level shunt detected by color flow Doppler.  LEFT VENTRICLE PLAX 2D LVIDd:         3.50 cm   Diastology LVIDs:         2.30 cm   LV e' medial:    9.57 cm/s LV PW:         0.80 cm   LV E/e' medial:  11.6 LV IVS:        0.80 cm   LV e' lateral:   8.92 cm/s LVOT diam:     1.60 cm   LV E/e' lateral: 12.4 LV SV:         41 LV SV Index:   29 LVOT Area:     2.01 cm  RIGHT VENTRICLE             IVC RV Basal diam:  2.90 cm     IVC diam: 1.90 cm RV S prime:     17.00 cm/s TAPSE (M-mode): 3.0 cm LEFT ATRIUM             Index        RIGHT ATRIUM          Index LA diam:        3.00 cm 2.12 cm/m   RA Area:     9.31 cm LA Vol (A2C):   42.1 ml 29.78 ml/m  RA Volume:   20.90 ml 14.78 ml/m LA Vol (A4C):   36.7 ml 25.96 ml/m LA Biplane Vol: 38.9 ml 27.52 ml/m  AORTIC VALVE                     PULMONIC VALVE AV Area (Vmax):    1.56 cm      PV Vmax:       0.78 m/s AV Area (Vmean):   1.58 cm      PV Peak grad:  2.5 mmHg AV Area (VTI):     1.70 cm AV Vmax:           171.00 cm/s AV Vmean:          115.000 cm/s AV VTI:            0.242 m AV Peak Grad:      11.7 mmHg AV Mean Grad:      6.0 mmHg LVOT Vmax:         133.00 cm/s LVOT Vmean:        90.300 cm/s LVOT VTI:          0.205 m LVOT/AV VTI ratio: 0.85 AI PHT:  309 msec  AORTA Ao Root diam: 2.40 cm MITRAL VALVE MV Area (PHT): 4.83 cm     SHUNTS MV Decel Time: 157 msec     Systemic VTI:  0.20 m MV E velocity: 111.00 cm/s  Systemic Diam: 1.60 cm MV Dustee Bottenfield velocity: 129.00 cm/s MV E/Glendell Schlottman ratio:  0.86 Charlton Haws MD Electronically signed by Charlton Haws MD Signature  Date/Time: 09/11/2022/2:19:22 PM    Final    DG Chest 2 View  Result Date: 09/07/2022 CLINICAL DATA:  Dyspnea EXAM: CHEST - 2 VIEW COMPARISON:  Previous studies including CT chest done on 08/20/2022 FINDINGS: Apparent shift of mediastinum to the right may be due to rotation. Cardiac size is within normal limits. There are no signs of pulmonary edema or focal pulmonary consolidation. Increase in AP diameter of chest suggests COPD. There is no pleural effusion or pneumothorax. There is decrease in height of multiple thoracic vertebral bodies with no significant interval change. There is previous vertebroplasty in 1 of the lower thoracic vertebral bodies. Tip of right IJ chest port is seen in superior vena cava. IMPRESSION: COPD. There are no signs of pulmonary edema or focal pulmonary consolidation. Electronically Signed   By: Ernie Avena M.D.   On: 09/07/2022 13:06   CT Chest W Contrast  Result Date: 08/25/2022 CLINICAL DATA:  Small cell lung cancer. Assess treatment response. * Tracking Code: BO * EXAM: CT CHEST WITH CONTRAST TECHNIQUE: Multidetector CT imaging of the chest was performed during intravenous contrast administration. RADIATION DOSE REDUCTION: This exam was performed according to the departmental dose-optimization program which includes automated exposure control, adjustment of the mA and/or kV according to patient size and/or use of iterative reconstruction technique. CONTRAST:  75mL OMNIPAQUE IOHEXOL 300 MG/ML  SOLN COMPARISON:  06/12/2022 FINDINGS: Cardiovascular: The heart is normal in size. No pericardial effusion. Stable atherosclerotic calcifications involving the thoracic aorta and branch vessels. No aneurysm or dissection. Mediastinum/Nodes: Small scattered sub 7 mm mediastinal and hilar lymph nodes. No mass or overt adenopathy. The esophagus is unremarkable. Lungs/Pleura: Stable underlying advanced emphysematous changes with areas of pulmonary scarring. The 3 cm lobulated left  lower lobe lung mass is no longer identified suggesting an excellent response to treatment. No new pulmonary nodules or pulmonary lesions. No pleural effusion. Upper Abdomen: No significant upper abdominal findings. No hepatic or adrenal gland lesions. No upper abdominal adenopathy. Stable vascular disease. Musculoskeletal: No significant bony findings. Stable thoracic compression fractures and vertebral augmentation changes. IMPRESSION: 1. The 3 cm lobulated left lower lobe lung mass is no longer identified suggesting an excellent response to treatment. 2. No new pulmonary nodules or pulmonary lesions. 3. No mediastinal or hilar mass or adenopathy. Aortic Atherosclerosis (ICD10-I70.0) and Emphysema (ICD10-J43.9). Electronically Signed   By: Rudie Meyer M.D.   On: 08/25/2022 08:52    Microbiology: No results found for this or any previous visit (from the past 240 hour(s)).   Labs: Basic Metabolic Panel: Recent Labs  Lab 09/10/22 1037 09/11/22 0418 09/12/22 0318 09/13/22 0246 09/14/22 0329  NA 133* 133* 134* 137 135  K 2.8* 5.1 4.0 3.6 3.1*  CL 97* 102 102 100 100  CO2 27 26 27 28 29   GLUCOSE 103* 99 103* 98 89  BUN 13 12 14 10 9   CREATININE 0.67 0.66 0.91 0.79 0.71  CALCIUM 8.3* 8.5* 8.6* 8.5* 8.4*  MG 1.4* 2.0 1.4* 2.0 1.5*  PHOS 2.5  --  1.9* 6.1* 4.2   Liver Function Tests: Recent Labs  Lab 09/10/22 1037  09/12/22 0318 09/13/22 0246 09/14/22 0329  AST 10* 11* 10* 9*  ALT 8 8 10 9   ALKPHOS 53 58 60 59  BILITOT 1.0 1.4* 0.8 1.0  PROT 5.8* 5.5* 5.6* 5.3*  ALBUMIN 3.0* 2.9* 2.9* 2.7*   No results for input(s): "LIPASE", "AMYLASE" in the last 168 hours. No results for input(s): "AMMONIA" in the last 168 hours. CBC: Recent Labs  Lab 09/10/22 1037 09/11/22 0418 09/12/22 0318 09/13/22 0246 09/14/22 0329  WBC 3.8* 4.5 5.8 3.5* 3.5*  NEUTROABS 2.7 3.1 4.7 2.4 2.4  HGB 8.0* 7.3* 8.8* 8.9* 8.2*  HCT 24.3* 22.7* 26.3* 27.2* 24.9*  MCV 95.3 96.2 98.9 97.1 95.8  PLT 49* 24*  39* 26* 23*   Cardiac Enzymes: No results for input(s): "CKTOTAL", "CKMB", "CKMBINDEX", "TROPONINI" in the last 168 hours. BNP: BNP (last 3 results) No results for input(s): "BNP" in the last 8760 hours.  ProBNP (last 3 results) No results for input(s): "PROBNP" in the last 8760 hours.  CBG: No results for input(s): "GLUCAP" in the last 168 hours.     Signed:  Lacretia Nicks MD.  [facility] 09/14/2022, 1:47 PM

## 2022-09-14 NOTE — TOC Progression Note (Signed)
Transition of Care [facility]) - Progression Note    Patient Details  Name: Sheena [name] MRN: [id number] Date of Birth: 1945/08/07  Transition of Care [facility]) CM/SW Contact  Larrie Kass, LCSW Phone Number: 09/14/2022, 9:57 AM  Clinical Narrative:     CSW met with pt regarding rec for DME and Home Health services. Pt has agreed to [facility] services and does not have a preference in [facility] agency. Pt also agrees to hospital bed and rolling walker. Pt reports her husband is the contact person. Pt has no preference in DME company. CSW sent referral to [facility] who will contact pt's husband to arrange hospital bed, pt's walker to be delivered to her room.   [facility] was arranged for HHPT /OT, will need HH orders prior to d/c. TOC to follow.         Expected Discharge Plan and Services                                               Social Determinants of Health (SDOH) Interventions SDOH Screenings   Food Insecurity: No Food Insecurity (09/07/2022)  Housing: Low Risk  (09/07/2022)  Transportation Needs: No Transportation Needs (09/07/2022)  Utilities: Not At Risk (09/07/2022)  Depression (PHQ2-9): Low Risk  (01/09/2022)  Financial Resource Strain: Low Risk  (01/09/2022)  Physical Activity: Inactive (01/09/2022)  Social Connections: Moderately Isolated (01/09/2022)  Stress: No Stress Concern Present (01/09/2022)  Tobacco Use: Medium Risk (09/10/2022)    Readmission Risk Interventions     No data to display

## 2022-09-15 ENCOUNTER — Inpatient Hospital Stay: Payer: 59 | Admitting: Internal Medicine

## 2022-09-15 ENCOUNTER — Other Ambulatory Visit: Payer: 59

## 2022-09-15 ENCOUNTER — Inpatient Hospital Stay: Payer: 59

## 2022-09-15 ENCOUNTER — Telehealth: Payer: Self-pay

## 2022-09-15 ENCOUNTER — Inpatient Hospital Stay: Payer: 59 | Admitting: Dietician

## 2022-09-15 ENCOUNTER — Other Ambulatory Visit: Payer: Self-pay

## 2022-09-15 ENCOUNTER — Telehealth: Payer: Self-pay | Admitting: *Deleted

## 2022-09-15 ENCOUNTER — Ambulatory Visit: Payer: 59

## 2022-09-15 DIAGNOSIS — E876 Hypokalemia: Secondary | ICD-10-CM | POA: Diagnosis not present

## 2022-09-15 DIAGNOSIS — C3432 Malignant neoplasm of lower lobe, left bronchus or lung: Secondary | ICD-10-CM | POA: Diagnosis not present

## 2022-09-15 DIAGNOSIS — I4891 Unspecified atrial fibrillation: Secondary | ICD-10-CM | POA: Diagnosis not present

## 2022-09-15 DIAGNOSIS — D61818 Other pancytopenia: Secondary | ICD-10-CM | POA: Diagnosis not present

## 2022-09-15 NOTE — Transitions of Care (Post Inpatient/ED Visit) (Signed)
09/15/2022  Name: Sheena [name] MRN: [id number] DOB: 06-Jun-1945  Today's TOC FU Call Status: Today's TOC FU Call Status:: Successful TOC FU Call Competed TOC FU Call Complete Date: 09/15/22 (Voicemail message received from spouse. Return call to spouse. Spoke with both spouse and patient.)  Transition Care Management Follow-up Telephone Call Date of Discharge: 09/14/22 Discharge Facility: [name]) Type of Discharge: Inpatient Admission Primary Inpatient Discharge Diagnosis:: "pancytopenia" How have you been since you were released from the hospital?: Better (Pt remains "tired and weak at times" but has been up walking up/down stairs. Hosp bed was just delivered. Appetite is fair. BM today. Denies any pain at present.) Any questions or concerns?: No  Items Reviewed: Did you receive and understand the discharge instructions provided?: Yes Medications obtained,verified, and reconciled?: Yes (Medications Reviewed) Any new allergies since your discharge?: No Dietary orders reviewed?: Yes Type of Diet Ordered:: low salt/heart healthy Do you have support at home?: Yes People in Home: spouse Name of Support/Comfort Primary Source: Rick  Medications Reviewed Today: Medications Reviewed Today     Reviewed by Charlyn Minerva, RN (Registered Nurse) on 09/15/22 at 1614  Med List Status: <None>   Medication Order Taking? Sig Documenting Provider Last Dose Status Informant  acetaminophen (TYLENOL) 500 MG tablet [id number] Yes Take 1,000 mg by mouth 2 (two) times daily as needed for mild pain. [provider] Taking Active Self, Pharmacy Records  amiodarone (PACERONE) 200 MG tablet [id number] Yes Take 1 tablet (200 mg total) by mouth 2 (two) times daily for 5 days, THEN 1 tablet (200 mg total) daily. Follow up with cardiology outpatient for refills. Zigmund Daniel., MD Taking Active   atorvastatin (LIPITOR) 20 MG tablet [id number] Yes TAKE 1 TABLET BY MOUTH EVERY  DAY  Patient taking differently: Take 20 mg by mouth at bedtime.   [name], MD Taking Active Self, Pharmacy Records  Calcium Citrate-Vitamin D (CALCIUM CITRATE PETITE/VIT D PO) [id number] Yes Take 1 tablet by mouth 2 (two) times daily. [provider] Taking Active Self, Pharmacy Records  cyclobenzaprine (FLEXERIL) 5 MG tablet [id number] No TAKE 1 TABLET BY MOUTH TWICE A DAY AS NEEDED FOR MUSCLE SPASM  Patient not taking: Reported on 09/15/2022   [name], MD Not Taking Active Self, Pharmacy Records  famotidine (PEPCID) 20 MG tablet [id number] Yes Take 20 mg by mouth daily. [provider] Taking Active Self, Pharmacy Records  lidocaine-prilocaine (EMLA) cream [id number] Yes Apply 1 Application topically as needed.  Patient taking differently: Apply 1 Application topically as needed (port access).   Heilingoetter, Cassandra L, PA-C Taking Active Self, Pharmacy Records  magnesium oxide (MAG-OX) 400 (240 Mg) MG tablet [id number] Yes Take 1 tablet (400 mg total) by mouth 2 (two) times daily. Follow your magnesium level outpatient with your PCP Zigmund Daniel., MD Taking Active   metoprolol tartrate (LOPRESSOR) 25 MG tablet [id number] Yes Take 0.5 tablets (12.5 mg total) by mouth 2 (two) times daily. Zigmund Daniel., MD Taking Active   potassium chloride [place] Dura M) 10 MEQ tablet [id number] Yes Take 2 tablets (20 mEq total) by mouth daily for 14 days. Follow repeat labs with your PCP within 1 week Zigmund Daniel., MD Taking Active   PRESCRIPTION MEDICATION [id number]  Apply 1 application  topically in the morning and at bedtime. Unknown cream given to Pt from the cancer center for after radiation [provider]  Active Self, Pharmacy Records  prochlorperazine (COMPAZINE) 10 MG  tablet [id number] Yes Take 1 tablet (10 mg total) by mouth every 6 (six) hours as needed for nausea or vomiting. Heilingoetter, Cassandra L, PA-C Taking Active Self,  Pharmacy Records  sucralfate (CARAFATE) 1 GM/10ML suspension [id number] Yes Take 10 mLs (1 g total) by mouth 4 (four) times daily -  with meals and at bedtime.  Patient taking differently: Take 1 g by mouth 2 (two) times daily.   Ronny Bacon, PA-C Taking Active Self, Pharmacy Records  traMADol Janean Sark) 50 MG tablet [id number] Yes Take 0.5-1 tablets (25-50 mg total) by mouth 2 (two) times daily as needed. For chronic low back pain. May refill monthly  Patient taking differently: Take 25 mg by mouth daily as needed for moderate pain or severe pain.   [name], MD Taking Active Self, Pharmacy Records            Home Care and Equipment/Supplies: Were Home Health Services Ordered?: Yes Name of Home Health Agency:: [facility] Has Agency set up a time to come to your home?: No (Reviewed with spouse agency contact info listed on d/c paperwork-he will call if he does not hear from them by tomorrow) Any new equipment or medical supplies ordered?: Yes Name of Medical supply agency?: [facility] Were you able to get the equipment/medical supplies?: Yes Do you have any questions related to the use of the equipment/supplies?: No  Functional Questionnaire: Do you need assistance with bathing/showering or dressing?: Yes Do you need assistance with meal preparation?: Yes Do you need assistance with eating?: No Do you have difficulty maintaining continence: No Do you need assistance with getting out of bed/getting out of a chair/moving?: Yes Do you have difficulty managing or taking your medications?: No  Follow up appointments reviewed: PCP Follow-up appointment confirmed?: No (Pt declined-seeing oncology & cardiology) MD Provider Line Number:[phone] Given: No Specialist Hospital Follow-up appointment confirmed?: No Reason Specialist Follow-Up Not Confirmed:  (spouse has called oncology office-awaiting to hear back about appt info, he was given number to cardiology office  to call and follow up on appt) Do you need transportation to your follow-up appointment?: No Do you understand care options if your condition(s) worsen?: Yes-patient verbalized understanding   TOC Interventions Today    Flowsheet Row Most Recent Value  TOC Interventions   TOC Interventions Discussed/Reviewed TOC Interventions Discussed      Interventions Today    Flowsheet Row Most Recent Value  Chronic Disease   Chronic disease during today's visit Other  [CA]  General Interventions   General Interventions Discussed/Reviewed General Interventions Discussed, Durable Medical Equipment (DME), Doctor Visits  Doctor Visits Discussed/Reviewed Doctor Visits Discussed, PCP, Specialist  Durable Medical Equipment (DME) Dan Humphreys, Other  [hosp bed]  PCP/Specialist Visits Compliance with follow-up visit  Education Interventions   Education Provided Provided Education  Provided Verbal Education On Nutrition, When to see the doctor, Medication, Other  [sx mgmt]  Nutrition Interventions   Nutrition Discussed/Reviewed Nutrition Discussed  Pharmacy Interventions   Pharmacy Dicussed/Reviewed Pharmacy Topics Discussed, Medications and their functions  Safety Interventions   Safety Discussed/Reviewed Safety Discussed, Home Safety       Alessandra Grout [facility] Care Management Care Management Community Coordinator Direct Phone: [phone] Toll Free: [phone] Fax: [phone]

## 2022-09-15 NOTE — Telephone Encounter (Signed)
Spoke with the patient's husband to see how she was doing since discharge from the hospital yesterday.  He states she is a little better just continues to have a great deal of weakness.  They are waiting for medical equipment to be delivered.  I asked if she would be able to come in for her appointments today and he stated he was unaware of her appointments with medical oncology and asked that we get them rescheduled for tomorrow.  I let him know that I would reach out to Dr. Asa Lente office and have them get there appointment's changed.  I let him know that we would cancel her radiation treatment as well and reassess tomorrow after her lab work.  He verbalized understanding and was appreciative of the call.  Lind Covert RN, BSN

## 2022-09-15 NOTE — Transitions of Care (Post Inpatient/ED Visit) (Signed)
   09/15/2022  Name: Sheena [name] MRN: [id number] DOB: 1945/09/26  Today's TOC FU Call Status: Today's TOC FU Call Status:: Unsuccessul Call (1st Attempt) Unsuccessful Call (1st Attempt) Date: 09/15/22  Attempted to reach the patient regarding the most recent Inpatient/ED visit.  Follow Up Plan: Additional outreach attempts will be made to reach the patient to complete the Transitions of Care (Post Inpatient/ED visit) call.      Antionette Fairy, RN,BSN,CCM [facility] Care Management Care Management Community Coordinator Direct Phone: [phone] Toll Free: [phone] Fax: [phone]

## 2022-09-16 ENCOUNTER — Ambulatory Visit: Payer: 59

## 2022-09-16 ENCOUNTER — Inpatient Hospital Stay: Payer: 59

## 2022-09-16 ENCOUNTER — Other Ambulatory Visit: Payer: Self-pay | Admitting: Radiation Oncology

## 2022-09-16 DIAGNOSIS — C3432 Malignant neoplasm of lower lobe, left bronchus or lung: Secondary | ICD-10-CM

## 2022-09-17 ENCOUNTER — Inpatient Hospital Stay: Payer: 59

## 2022-09-17 ENCOUNTER — Ambulatory Visit: Payer: 59

## 2022-09-17 ENCOUNTER — Ambulatory Visit
Admission: RE | Admit: 2022-09-17 | Discharge: 2022-09-17 | Disposition: A | Discharge status: Home / Self Care | Payer: 59 | Source: Ambulatory Visit | Attending: Radiation Oncology | Admitting: Radiation Oncology

## 2022-09-17 ENCOUNTER — Telehealth: Payer: Self-pay

## 2022-09-17 ENCOUNTER — Other Ambulatory Visit: Payer: Self-pay

## 2022-09-17 DIAGNOSIS — Z79899 Other long term (current) drug therapy: Secondary | ICD-10-CM | POA: Diagnosis not present

## 2022-09-17 DIAGNOSIS — Z87891 Personal history of nicotine dependence: Secondary | ICD-10-CM | POA: Diagnosis not present

## 2022-09-17 DIAGNOSIS — C3432 Malignant neoplasm of lower lobe, left bronchus or lung: Secondary | ICD-10-CM

## 2022-09-17 DIAGNOSIS — Z5189 Encounter for other specified aftercare: Secondary | ICD-10-CM | POA: Diagnosis not present

## 2022-09-17 DIAGNOSIS — Z510 Encounter for antineoplastic radiation therapy: Secondary | ICD-10-CM | POA: Diagnosis not present

## 2022-09-17 DIAGNOSIS — Z5111 Encounter for antineoplastic chemotherapy: Secondary | ICD-10-CM | POA: Diagnosis not present

## 2022-09-17 DIAGNOSIS — Z95828 Presence of other vascular implants and grafts: Secondary | ICD-10-CM

## 2022-09-17 LAB — RAD ONC ARIA SESSION SUMMARY
Course Elapsed Days: 44
Plan Fractions Treated to Date: 24
Plan Prescribed Dose Per Fraction: 2 Gy
Plan Total Fractions Prescribed: 30
Plan Total Prescribed Dose: 60 Gy
Reference Point Dosage Given to Date: 48 Gy
Reference Point Session Dosage Given: 2 Gy
Session Number: 24

## 2022-09-17 LAB — CBC WITH DIFFERENTIAL (CANCER CENTER ONLY)
Abs Immature Granulocytes: 0.09 10*3/uL — ABNORMAL HIGH (ref 0.00–0.07)
Basophils Absolute: 0 10*3/uL (ref 0.0–0.1)
Basophils Relative: 1 %
Eosinophils Absolute: 0 10*3/uL (ref 0.0–0.5)
Eosinophils Relative: 0 %
HCT: 30.6 % — ABNORMAL LOW (ref 36.0–46.0)
Hemoglobin: 10.3 g/dL — ABNORMAL LOW (ref 12.0–15.0)
Immature Granulocytes: 1 %
Lymphocytes Relative: 12 %
Lymphs Abs: 0.8 10*3/uL (ref 0.7–4.0)
MCH: 32.1 pg (ref 26.0–34.0)
MCHC: 33.7 g/dL (ref 30.0–36.0)
MCV: 95.3 fL (ref 80.0–100.0)
Monocytes Absolute: 0.8 10*3/uL (ref 0.1–1.0)
Monocytes Relative: 12 %
Neutro Abs: 4.9 10*3/uL (ref 1.7–7.7)
Neutrophils Relative %: 74 %
Platelet Count: 72 10*3/uL — ABNORMAL LOW (ref 150–400)
RBC: 3.21 MIL/uL — ABNORMAL LOW (ref 3.87–5.11)
RDW: 14.2 % (ref 11.5–15.5)
WBC Count: 6.6 10*3/uL (ref 4.0–10.5)
nRBC: 0 % (ref 0.0–0.2)

## 2022-09-17 LAB — CMP (CANCER CENTER ONLY)
ALT: 7 U/L (ref 0–44)
AST: 11 U/L — ABNORMAL LOW (ref 15–41)
Albumin: 3.7 g/dL (ref 3.5–5.0)
Alkaline Phosphatase: 87 U/L (ref 38–126)
Anion gap: 5 (ref 5–15)
BUN: 10 mg/dL (ref 8–23)
CO2: 30 mmol/L (ref 22–32)
Calcium: 10.1 mg/dL (ref 8.9–10.3)
Chloride: 101 mmol/L (ref 98–111)
Creatinine: 0.89 mg/dL (ref 0.44–1.00)
GFR, Estimated: >60 mL/min (ref >60)
Glucose, Bld: 108 mg/dL — ABNORMAL HIGH (ref 70–99)
Potassium: 4.9 mmol/L (ref 3.5–5.1)
Sodium: 136 mmol/L (ref 135–145)
Total Bilirubin: 0.6 mg/dL (ref 0.3–1.2)
Total Protein: 6.8 g/dL (ref 6.5–8.1)

## 2022-09-17 LAB — SAMPLE TO BLOOD BANK

## 2022-09-17 MED ORDER — SODIUM CHLORIDE 0.9% FLUSH
10.0000 mL | Freq: Once | INTRAVENOUS | Status: AC
  Administered 2022-09-17: 10 mL

## 2022-09-17 MED ORDER — HEPARIN SOD (PORK) LOCK FLUSH 100 UNIT/ML IV SOLN
500.0000 [IU] | Freq: Once | INTRAVENOUS | Status: AC
  Administered 2022-09-17: 500 [IU]

## 2022-09-17 NOTE — Telephone Encounter (Signed)
CRITICAL VALUE STICKER  CRITICAL VALUE: HGB -10.3  RECEIVER (on-site recipient of call): Darrius Montano P. LPN  DATE & TIME NOTIFIED: 09/17/22 1213pm  MESSENGER (representative from lab): Lab  MD NOTIFIED:  Dr. Arbutus Ped

## 2022-09-18 ENCOUNTER — Ambulatory Visit: Payer: 59

## 2022-09-19 ENCOUNTER — Ambulatory Visit: Payer: 59

## 2022-09-20 NOTE — Progress Notes (Deleted)
[facility] OFFICE PROGRESS NOTE  [name], MD [address] [place] [address]  DIAGNOSIS: Limited stage (T2a, N2, M0) small cell lung cancer presented with left lower lobe lung mass in addition to left hilar and mediastinal lymphadenopathy diagnosed in April 2024.   PRIOR THERAPY: None  CURRENT THERAPY: Systemic chemotherapy with carboplatin for AUC of 5 on day 1 and 2 etoposide 100 Mg/M2 on days 1, 2 and 3 every 3 weeks. This will be concurrent with radiotherapy. She is status post 3 cycles of treatment. First dose on 07/14/22. She had significant pancytopenia with cycle #3. Starting from cycle #4 ***  INTERVAL HISTORY: Sheena [name] 77 y.o. female returns to clinic today for follow-up visit accompanied by ***.  The patient was last seen in the clinic on 08/25/2022 by Dr. Arbutus Ped prior to starting cycle #3.  The patient's hemoglobin was 10, white count 15, platelets 228 K prior to starting cycle #3.  She developed significant pancytopenia on 09/07/2022 and presented to the emergency room due to weakness.  In the ER her platelet count was less than 5000, her total white blood cell count of 1, and hemoglobin of 5.  During her hospital admission she received 3 units of blood and 4 units of platelets.  She also receives Granix injections.  Her hospitalization was complicated by atrial fibrillation with RVR and she was treated with amiodarone and metoprolol.  Since being discharged she is feeling **.  Her last day of radiation is tentatively scheduled for 09/30/22.   Fatigue and weakness?  She denies any fever, night sweats.  Weight loss?  Appetite?  Shortness of breath?  Supplemental drinks?  Seen by nutrition?  She denies any chest pain or hemoptysis.  Cough?  Dyspnea on exertion?  She denies any nausea, vomiting, diarrhea, or constipation.  Denies any visible abnormal bleeding or bruising.  Denies any signs and symptoms of infection.  Denies any headache or visual  changes.  She is here today for evaluation repeat blood work before considering starting cycle #4.   MEDICAL HISTORY: Past Medical History:  Diagnosis Date   Anxiety    Heart murmur    Hypertension    Scarlet fever with other complications childhood   "had to learn to work again" has had leg weakness    ALLERGIES:  is allergic to antihistamines, diphenhydramine-type; aspirin; and evista [raloxifene].  MEDICATIONS:  Current Outpatient Medications  Medication Sig Dispense Refill   acetaminophen (TYLENOL) 500 MG tablet Take 1,000 mg by mouth 2 (two) times daily as needed for mild pain.     amiodarone (PACERONE) 200 MG tablet Take 1 tablet (200 mg total) by mouth 2 (two) times daily for 5 days, THEN 1 tablet (200 mg total) daily. Follow up with cardiology outpatient for refills. 40 tablet 0   atorvastatin (LIPITOR) 20 MG tablet TAKE 1 TABLET BY MOUTH EVERY DAY (Patient taking differently: Take 20 mg by mouth at bedtime.) 90 tablet 3   Calcium Citrate-Vitamin D (CALCIUM CITRATE PETITE/VIT D PO) Take 1 tablet by mouth 2 (two) times daily.     cyclobenzaprine (FLEXERIL) 5 MG tablet TAKE 1 TABLET BY MOUTH TWICE A DAY AS NEEDED FOR MUSCLE SPASM (Patient not taking: Reported on 09/15/2022) 30 tablet 1   famotidine (PEPCID) 20 MG tablet Take 20 mg by mouth daily.     lidocaine-prilocaine (EMLA) cream Apply 1 Application topically as needed. (Patient taking differently: Apply 1 Application topically as needed (port access).) 30 g  2   magnesium oxide (MAG-OX) 400 (240 Mg) MG tablet Take 1 tablet (400 mg total) by mouth 2 (two) times daily. Follow your magnesium level outpatient with your PCP 60 tablet 0   metoprolol tartrate (LOPRESSOR) 25 MG tablet Take 0.5 tablets (12.5 mg total) by mouth 2 (two) times daily. 30 tablet 1   potassium chloride (KLOR-CON M) 10 MEQ tablet Take 2 tablets (20 mEq total) by mouth daily for 14 days. Follow repeat labs with your PCP within 1 week 28 tablet 0   PRESCRIPTION  MEDICATION Apply 1 application  topically in the morning and at bedtime. Unknown cream given to Pt from the cancer center for after radiation     prochlorperazine (COMPAZINE) 10 MG tablet Take 1 tablet (10 mg total) by mouth every 6 (six) hours as needed for nausea or vomiting. 30 tablet 2   sucralfate (CARAFATE) 1 GM/10ML suspension Take 10 mLs (1 g total) by mouth 4 (four) times daily -  with meals and at bedtime. (Patient taking differently: Take 1 g by mouth 2 (two) times daily.) 420 mL 1   traMADol (ULTRAM) 50 MG tablet Take 0.5-1 tablets (25-50 mg total) by mouth 2 (two) times daily as needed. For chronic low back pain. May refill monthly (Patient taking differently: Take 25 mg by mouth daily as needed for moderate pain or severe pain.) 60 tablet 2   No current facility-administered medications for this visit.    SURGICAL HISTORY:  Past Surgical History:  Procedure Laterality Date   BRONCHIAL NEEDLE ASPIRATION BIOPSY  06/30/2022   Procedure: BRONCHIAL NEEDLE ASPIRATION BIOPSIES;  Surgeon: [name], DO;  Location: [facility];  Service: Pulmonary;;   IR IMAGING GUIDED PORT INSERTION  07/31/2022   KYPHOPLASTY     2014   peridontal surgery     VIDEO BRONCHOSCOPY WITH ENDOBRONCHIAL ULTRASOUND N/A 06/30/2022   Procedure: VIDEO BRONCHOSCOPY WITH ENDOBRONCHIAL ULTRASOUND;  Surgeon: [name], DO;  Location: [facility];  Service: Pulmonary;  Laterality: N/A;    REVIEW OF SYSTEMS:   Review of Systems  Constitutional: Negative for appetite change, chills, fatigue, fever and unexpected weight change.  HENT:   Negative for mouth sores, nosebleeds, sore throat and trouble swallowing.   Eyes: Negative for eye problems and icterus.  Respiratory: Negative for cough, hemoptysis, shortness of breath and wheezing.   Cardiovascular: Negative for chest pain and leg swelling.  Gastrointestinal: Negative for abdominal pain, constipation, diarrhea, nausea and vomiting.  Genitourinary:  Negative for bladder incontinence, difficulty urinating, dysuria, frequency and hematuria.   Musculoskeletal: Negative for back pain, gait problem, neck pain and neck stiffness.  Skin: Negative for itching and rash.  Neurological: Negative for dizziness, extremity weakness, gait problem, headaches, light-headedness and seizures.  Hematological: Negative for adenopathy. Does not bruise/bleed easily.  Psychiatric/Behavioral: Negative for confusion, depression and sleep disturbance. The patient is not nervous/anxious.     PHYSICAL EXAMINATION:  There were no vitals taken for this visit.  ECOG PERFORMANCE STATUS: {CHL ONC ECOG [id number]  Physical Exam  Constitutional: Oriented to person, place, and time and well-developed, well-nourished, and in no distress. No distress.  HENT:  Head: Normocephalic and atraumatic.  Mouth/Throat: Oropharynx is clear and moist. No oropharyngeal exudate.  Eyes: Conjunctivae are normal. Right eye exhibits no discharge. Left eye exhibits no discharge. No scleral icterus.  Neck: Normal range of motion. Neck supple.  Cardiovascular: Normal rate, regular rhythm, normal heart sounds and intact distal pulses.   Pulmonary/Chest: Effort normal and breath sounds  normal. No respiratory distress. No wheezes. No rales.  Abdominal: Soft. Bowel sounds are normal. Exhibits no distension and no mass. There is no tenderness.  Musculoskeletal: Normal range of motion. Exhibits no edema.  Lymphadenopathy:    No cervical adenopathy.  Neurological: Alert and oriented to person, place, and time. Exhibits normal muscle tone. Gait normal. Coordination normal.  Skin: Skin is warm and dry. No rash noted. Not diaphoretic. No erythema. No pallor.  Psychiatric: Mood, memory and judgment normal.  Vitals reviewed.  LABORATORY DATA: Lab Results  Component Value Date   WBC 6.6 09/17/2022   HGB 10.3 (L) 09/17/2022   HCT 30.6 (L) 09/17/2022   MCV 95.3 09/17/2022   PLT 72 (L)  09/17/2022      Chemistry      Component Value Date/Time   NA 136 09/17/2022 1101   K 4.9 09/17/2022 1101   CL 101 09/17/2022 1101   CO2 30 09/17/2022 1101   BUN 10 09/17/2022 1101   CREATININE 0.89 09/17/2022 1101   CREATININE 1.18 (H) 02/19/2020 1048      Component Value Date/Time   CALCIUM 10.1 09/17/2022 1101   ALKPHOS 87 09/17/2022 1101   AST 11 (L) 09/17/2022 1101   ALT 7 09/17/2022 1101   BILITOT 0.6 09/17/2022 1101       RADIOGRAPHIC STUDIES:  ECHOCARDIOGRAM COMPLETE  Result Date: 09/11/2022    ECHOCARDIOGRAM REPORT   Patient Name:   Sheena [name] Date of Exam: 09/11/2022 Medical Rec #:  [id number]      Height:       60.0 in Accession #:    [id number]     Weight:       104.0 lb Date of Birth:  June 24, 1945      BSA:          1.414 m Patient Age:    77 years       BP:           114/48 mmHg Patient Gender: F              HR:           103 bpm. Exam Location:  Inpatient Procedure: 2D Echo, Cardiac Doppler, Color Doppler and Strain Analysis Indications:    A-Fib I48.91  History:        Patient has prior history of Echocardiogram examinations, most                 recent 11/13/2012. Chemo therapy, Arrythmias:Atrial Fibrillation;                 Risk Factors:Hypertension, Dyslipidemia and Former Smoker.  Sonographer:    Dondra Prader RVT RCS Referring Phys: [phone] A [name]  Sonographer Comments: Technically challenging study due to limited acoustic windows and Technically difficult study due to poor echo windows. IMPRESSIONS  1. Left ventricular ejection fraction, by estimation, is 60 to 65%. The left ventricle has normal function. The left ventricle has no regional wall motion abnormalities. Left ventricular diastolic parameters were normal.  2. Right ventricular systolic function is normal. The right ventricular size is normal.  3. The mitral valve is abnormal. No evidence of mitral valve regurgitation. No evidence of mitral stenosis.  4. The aortic valve was not well  visualized. There is mild calcification of the aortic valve. There is mild thickening of the aortic valve. Aortic valve regurgitation is trivial. Mild aortic valve stenosis.  5. The inferior vena cava is normal in size with greater than 50% respiratory  variability, suggesting right atrial pressure of 3 mmHg. FINDINGS  Left Ventricle: Left ventricular ejection fraction, by estimation, is 60 to 65%. The left ventricle has normal function. The left ventricle has no regional wall motion abnormalities. The left ventricular internal cavity size was normal in size. There is  no left ventricular hypertrophy. Left ventricular diastolic parameters were normal. Right Ventricle: The right ventricular size is normal. No increase in right ventricular wall thickness. Right ventricular systolic function is normal. Left Atrium: Left atrial size was normal in size. Right Atrium: Right atrial size was normal in size. Pericardium: There is no evidence of pericardial effusion. Mitral Valve: The mitral valve is abnormal. There is mild thickening of the mitral valve leaflet(s). No evidence of mitral valve regurgitation. No evidence of mitral valve stenosis. Tricuspid Valve: The tricuspid valve is normal in structure. Tricuspid valve regurgitation is not demonstrated. No evidence of tricuspid stenosis. Aortic Valve: The aortic valve was not well visualized. There is mild calcification of the aortic valve. There is mild thickening of the aortic valve. Aortic valve regurgitation is trivial. Aortic regurgitation PHT measures 309 msec. Mild aortic stenosis  is present. Aortic valve mean gradient measures 6.0 mmHg. Aortic valve peak gradient measures 11.7 mmHg. Aortic valve area, by VTI measures 1.70 cm. Pulmonic Valve: The pulmonic valve was normal in structure. Pulmonic valve regurgitation is mild. No evidence of pulmonic stenosis. Aorta: The aortic root is normal in size and structure and the ascending aorta was not well visualized. Venous:  The inferior vena cava is normal in size with greater than 50% respiratory variability, suggesting right atrial pressure of 3 mmHg. IAS/Shunts: No atrial level shunt detected by color flow Doppler.  LEFT VENTRICLE PLAX 2D LVIDd:         3.50 cm   Diastology LVIDs:         2.30 cm   LV e' medial:    9.57 cm/s LV PW:         0.80 cm   LV E/e' medial:  11.6 LV IVS:        0.80 cm   LV e' lateral:   8.92 cm/s LVOT diam:     1.60 cm   LV E/e' lateral: 12.4 LV SV:         41 LV SV Index:   29 LVOT Area:     2.01 cm  RIGHT VENTRICLE             IVC RV Basal diam:  2.90 cm     IVC diam: 1.90 cm RV S prime:     17.00 cm/s TAPSE (M-mode): 3.0 cm LEFT ATRIUM             Index        RIGHT ATRIUM          Index LA diam:        3.00 cm 2.12 cm/m   RA Area:     9.31 cm LA Vol (A2C):   42.1 ml 29.78 ml/m  RA Volume:   20.90 ml 14.78 ml/m LA Vol (A4C):   36.7 ml 25.96 ml/m LA Biplane Vol: 38.9 ml 27.52 ml/m  AORTIC VALVE                     PULMONIC VALVE AV Area (Vmax):    1.56 cm      PV Vmax:       0.78 m/s AV Area (Vmean):   1.58 cm  PV Peak grad:  2.5 mmHg AV Area (VTI):     1.70 cm AV Vmax:           171.00 cm/s AV Vmean:          115.000 cm/s AV VTI:            0.242 m AV Peak Grad:      11.7 mmHg AV Mean Grad:      6.0 mmHg LVOT Vmax:         133.00 cm/s LVOT Vmean:        90.300 cm/s LVOT VTI:          0.205 m LVOT/AV VTI ratio: 0.85 AI PHT:            309 msec  AORTA Ao Root diam: 2.40 cm MITRAL VALVE MV Area (PHT): 4.83 cm     SHUNTS MV Decel Time: 157 msec     Systemic VTI:  0.20 m MV E velocity: 111.00 cm/s  Systemic Diam: 1.60 cm MV A velocity: 129.00 cm/s MV E/A ratio:  0.86 Charlton Haws MD Electronically signed by Charlton Haws MD Signature Date/Time: 09/11/2022/2:19:22 PM    Final    DG Chest 2 View  Result Date: 09/07/2022 CLINICAL DATA:  Dyspnea EXAM: CHEST - 2 VIEW COMPARISON:  Previous studies including CT chest done on 08/20/2022 FINDINGS: Apparent shift of mediastinum to the right may be due  to rotation. Cardiac size is within normal limits. There are no signs of pulmonary edema or focal pulmonary consolidation. Increase in AP diameter of chest suggests COPD. There is no pleural effusion or pneumothorax. There is decrease in height of multiple thoracic vertebral bodies with no significant interval change. There is previous vertebroplasty in 1 of the lower thoracic vertebral bodies. Tip of right IJ chest port is seen in superior vena cava. IMPRESSION: COPD. There are no signs of pulmonary edema or focal pulmonary consolidation. Electronically Signed   By: Ernie Avena M.D.   On: 09/07/2022 13:06     ASSESSMENT/PLAN:  This is a very pleasant 77 year old Caucasian female recently diagnosed with at least limited stage (T2a, N2, M0) small cell lung cancer presented with left lower lobe lung mass in addition to left hilar and mediastinal lymphadenopathy diagnosed in April 2024.    She is currently undergoing treatment with systemic chemotherapy with carboplatin for AUC of 5 on day 1 and etoposide 100 Mg/M2 on days 1, 2 and 3 every 3 weeks.  This will be concurrent with radiotherapy if the patient has no evidence of metastatic disease to the brain.  She will not be a good candidate for treatment with cisplatin because of her comorbidities and age. She is status post 3 cycles.  She developed significant pancytopenia following cycle #3 that required 4 units of platelets and 3 units of blood.   She was hospitalized from 7/8-7/ ***.   The patient was seen with Dr. Arbutus Ped today.  Labs were reviewed.  Recommend And***.  Delay 1 more week?  Dose reduction?  Monitor labs twice a week?  Arrange for restaging CT scan?  Her last day radiation is scheduled for 09/30/2022.  Nutrition?  The patient was advised to call immediately if she has any concerning symptoms in the interval. The patient voices understanding of current disease status and treatment options and is in agreement with the current  care plan. All questions were answered. The patient knows to call the clinic with any problems, questions or concerns. We  can certainly see the patient much sooner if necessary         and tolerated it well without any appreciable adverse side effects except she had a yeast infection which resolved.          No orders of the defined types were placed in this encounter.    I spent {CHL ONC TIME VISIT - ZOXWR:[id number]} counseling the patient face to face. The total time spent in the appointment was {CHL ONC TIME VISIT - BJYNW:[id number]}.   L , PA-C 09/20/22

## 2022-09-21 ENCOUNTER — Ambulatory Visit
Admission: RE | Admit: 2022-09-21 | Discharge: 2022-09-21 | Disposition: A | Discharge status: Home / Self Care | Payer: 59 | Source: Ambulatory Visit | Attending: Radiation Oncology | Admitting: Radiation Oncology

## 2022-09-21 ENCOUNTER — Other Ambulatory Visit: Payer: Self-pay

## 2022-09-21 ENCOUNTER — Ambulatory Visit: Payer: 59

## 2022-09-21 DIAGNOSIS — Z5189 Encounter for other specified aftercare: Secondary | ICD-10-CM | POA: Diagnosis not present

## 2022-09-21 DIAGNOSIS — Z510 Encounter for antineoplastic radiation therapy: Secondary | ICD-10-CM | POA: Diagnosis not present

## 2022-09-21 DIAGNOSIS — Z5111 Encounter for antineoplastic chemotherapy: Secondary | ICD-10-CM | POA: Diagnosis not present

## 2022-09-21 DIAGNOSIS — Z87891 Personal history of nicotine dependence: Secondary | ICD-10-CM | POA: Diagnosis not present

## 2022-09-21 DIAGNOSIS — C3432 Malignant neoplasm of lower lobe, left bronchus or lung: Secondary | ICD-10-CM | POA: Diagnosis not present

## 2022-09-21 DIAGNOSIS — Z79899 Other long term (current) drug therapy: Secondary | ICD-10-CM | POA: Diagnosis not present

## 2022-09-21 LAB — RAD ONC ARIA SESSION SUMMARY
Course Elapsed Days: 48
Plan Fractions Treated to Date: 25
Plan Prescribed Dose Per Fraction: 2 Gy
Plan Total Fractions Prescribed: 30
Plan Total Prescribed Dose: 60 Gy
Reference Point Dosage Given to Date: 50 Gy
Reference Point Session Dosage Given: 2 Gy
Session Number: 25

## 2022-09-21 MED FILL — Fosaprepitant Dimeglumine For IV Infusion 150 MG (Base Eq): INTRAVENOUS | Qty: 5 | Status: AC

## 2022-09-21 MED FILL — Dexamethasone Sodium Phosphate Inj 100 MG/10ML: INTRAMUSCULAR | Qty: 1 | Status: AC

## 2022-09-22 ENCOUNTER — Ambulatory Visit: Payer: 59

## 2022-09-22 ENCOUNTER — Other Ambulatory Visit: Payer: Self-pay

## 2022-09-22 ENCOUNTER — Ambulatory Visit
Admission: RE | Admit: 2022-09-22 | Discharge: 2022-09-22 | Disposition: A | Discharge status: Home / Self Care | Payer: 59 | Source: Ambulatory Visit | Attending: Radiation Oncology | Admitting: Radiation Oncology

## 2022-09-22 ENCOUNTER — Inpatient Hospital Stay: Payer: 59

## 2022-09-22 ENCOUNTER — Ambulatory Visit: Admission: RE | Admit: 2022-09-22 | Payer: 59 | Source: Ambulatory Visit

## 2022-09-22 ENCOUNTER — Ambulatory Visit: Payer: 59 | Admitting: Internal Medicine

## 2022-09-22 ENCOUNTER — Inpatient Hospital Stay: Payer: 59 | Admitting: Physician Assistant

## 2022-09-22 VITALS — BP 115/62 | HR 94 | Temp 98.5°F | Resp 17 | Ht 60.0 in | Wt 95.0 lb

## 2022-09-22 DIAGNOSIS — Z87891 Personal history of nicotine dependence: Secondary | ICD-10-CM | POA: Diagnosis not present

## 2022-09-22 DIAGNOSIS — Z510 Encounter for antineoplastic radiation therapy: Secondary | ICD-10-CM | POA: Diagnosis not present

## 2022-09-22 DIAGNOSIS — Z95828 Presence of other vascular implants and grafts: Secondary | ICD-10-CM

## 2022-09-22 DIAGNOSIS — C3490 Malignant neoplasm of unspecified part of unspecified bronchus or lung: Secondary | ICD-10-CM | POA: Diagnosis not present

## 2022-09-22 DIAGNOSIS — Z79899 Other long term (current) drug therapy: Secondary | ICD-10-CM | POA: Diagnosis not present

## 2022-09-22 DIAGNOSIS — C3432 Malignant neoplasm of lower lobe, left bronchus or lung: Secondary | ICD-10-CM

## 2022-09-22 DIAGNOSIS — Z5189 Encounter for other specified aftercare: Secondary | ICD-10-CM | POA: Diagnosis not present

## 2022-09-22 DIAGNOSIS — Z5111 Encounter for antineoplastic chemotherapy: Secondary | ICD-10-CM | POA: Diagnosis not present

## 2022-09-22 LAB — CBC WITH DIFFERENTIAL (CANCER CENTER ONLY)
Abs Immature Granulocytes: 0.07 10*3/uL (ref 0.00–0.07)
Basophils Absolute: 0.1 10*3/uL (ref 0.0–0.1)
Basophils Relative: 1 %
Eosinophils Absolute: 0 10*3/uL (ref 0.0–0.5)
Eosinophils Relative: 0 %
HCT: 27.6 % — ABNORMAL LOW (ref 36.0–46.0)
Hemoglobin: 9.2 g/dL — ABNORMAL LOW (ref 12.0–15.0)
Immature Granulocytes: 1 %
Lymphocytes Relative: 8 %
Lymphs Abs: 0.6 10*3/uL — ABNORMAL LOW (ref 0.7–4.0)
MCH: 32.3 pg (ref 26.0–34.0)
MCHC: 33.3 g/dL (ref 30.0–36.0)
MCV: 96.8 fL (ref 80.0–100.0)
Monocytes Absolute: 0.8 10*3/uL (ref 0.1–1.0)
Monocytes Relative: 12 %
Neutro Abs: 5.2 10*3/uL (ref 1.7–7.7)
Neutrophils Relative %: 78 %
Platelet Count: 204 10*3/uL (ref 150–400)
RBC: 2.85 MIL/uL — ABNORMAL LOW (ref 3.87–5.11)
RDW: 15.9 % — ABNORMAL HIGH (ref 11.5–15.5)
WBC Count: 6.7 10*3/uL (ref 4.0–10.5)
nRBC: 0 % (ref 0.0–0.2)

## 2022-09-22 LAB — RAD ONC ARIA SESSION SUMMARY
Course Elapsed Days: 49
Plan Fractions Treated to Date: 26
Plan Prescribed Dose Per Fraction: 2 Gy
Plan Total Fractions Prescribed: 30
Plan Total Prescribed Dose: 60 Gy
Reference Point Dosage Given to Date: 52 Gy
Reference Point Session Dosage Given: 2 Gy
Session Number: 26

## 2022-09-22 LAB — CMP (CANCER CENTER ONLY)
ALT: 6 U/L (ref 0–44)
AST: 12 U/L — ABNORMAL LOW (ref 15–41)
Albumin: 3.6 g/dL (ref 3.5–5.0)
Alkaline Phosphatase: 82 U/L (ref 38–126)
Anion gap: 7 (ref 5–15)
BUN: 14 mg/dL (ref 8–23)
CO2: 26 mmol/L (ref 22–32)
Calcium: 9.5 mg/dL (ref 8.9–10.3)
Chloride: 104 mmol/L (ref 98–111)
Creatinine: 0.92 mg/dL (ref 0.44–1.00)
GFR, Estimated: >60 mL/min (ref >60)
Glucose, Bld: 90 mg/dL (ref 70–99)
Potassium: 4.4 mmol/L (ref 3.5–5.1)
Sodium: 137 mmol/L (ref 135–145)
Total Bilirubin: 0.5 mg/dL (ref 0.3–1.2)
Total Protein: 6.2 g/dL — ABNORMAL LOW (ref 6.5–8.1)

## 2022-09-22 MED ORDER — PALONOSETRON HCL INJECTION 0.25 MG/5ML
0.2500 mg | Freq: Once | INTRAVENOUS | Status: AC
  Administered 2022-09-22: 0.25 mg via INTRAVENOUS
  Filled 2022-09-22: qty 5

## 2022-09-22 MED ORDER — SODIUM CHLORIDE 0.9 % IV SOLN
80.0000 mg/m2 | Freq: Once | INTRAVENOUS | Status: AC
  Administered 2022-09-22: 114 mg via INTRAVENOUS
  Filled 2022-09-22: qty 5.7

## 2022-09-22 MED ORDER — SODIUM CHLORIDE 0.9% FLUSH
10.0000 mL | Freq: Once | INTRAVENOUS | Status: AC
  Administered 2022-09-22: 10 mL

## 2022-09-22 MED ORDER — SODIUM CHLORIDE 0.9 % IV SOLN: Freq: Once | INTRAVENOUS | Status: AC

## 2022-09-22 MED ORDER — SODIUM CHLORIDE 0.9 % IV SOLN
150.0000 mg | Freq: Once | INTRAVENOUS | Status: AC
  Administered 2022-09-22: 150 mg via INTRAVENOUS
  Filled 2022-09-22: qty 5
  Filled 2022-09-22: qty 150

## 2022-09-22 MED ORDER — SODIUM CHLORIDE 0.9 % IV SOLN
10.0000 mg | Freq: Once | INTRAVENOUS | Status: AC
  Administered 2022-09-22: 10 mg via INTRAVENOUS
  Filled 2022-09-22: qty 10
  Filled 2022-09-22: qty 1

## 2022-09-22 MED ORDER — SODIUM CHLORIDE 0.9 % IV SOLN
245.6000 mg | Freq: Once | INTRAVENOUS | Status: AC
  Administered 2022-09-22: 250 mg via INTRAVENOUS
  Filled 2022-09-22: qty 25

## 2022-09-22 MED ORDER — SODIUM CHLORIDE 0.9% FLUSH
10.0000 mL | INTRAVENOUS | Status: DC | PRN
  Administered 2022-09-22: 10 mL

## 2022-09-22 MED ORDER — HEPARIN SOD (PORK) LOCK FLUSH 100 UNIT/ML IV SOLN
500.0000 [IU] | Freq: Once | INTRAVENOUS | Status: AC | PRN
  Administered 2022-09-22: 500 [IU]

## 2022-09-22 MED FILL — Dexamethasone Sodium Phosphate Inj 100 MG/10ML: INTRAMUSCULAR | Qty: 1 | Status: AC

## 2022-09-22 NOTE — Progress Notes (Signed)
[facility] [phone]   [phone]  OFFICE PROGRESS NOTE  [name], MD [address] [place] [address]  DIAGNOSIS: limited stage (T2a, N2, M0) small cell lung cancer presented with left lower lobe lung mass in addition to left hilar and mediastinal lymphadenopathy diagnosed in April 2024.   PRIOR THERAPY: None  CURRENT THERAPY: Systemic therapy with carboplatin for AUC of 5 on day 1 and etoposide 100 Mg/M2 on days 1, 2 and 3 every 3 weeks concurrent with radiation.  She is status post 3 cycles.    INTERVAL HISTORY: Sheena [name] 77 y.o. female returns to the clinic today for follow-up visit accompanied by her husband.  The patient is feeling fine today with no concerning complaints except for mild nausea and fatigue.  She was admitted to the hospital after cycle #3 with significant pancytopenia requiring PRBCs as well as platelet transfusion.  She also was treated with antibiotics for the neutropenic fever.  The patient denied having any chest pain, shortness of breath, cough or hemoptysis.  She denied having any vomiting, abdominal pain, diarrhea or constipation.  She has no recent weight loss or night sweats.  She is here today for evaluation before starting cycle #4.  MEDICAL HISTORY: Past Medical History:  Diagnosis Date   Anxiety    Heart murmur    Hypertension    Scarlet fever with other complications childhood   "had to learn to work again" has had leg weakness    ALLERGIES:  is allergic to antihistamines, diphenhydramine-type; aspirin; and evista [raloxifene].  MEDICATIONS:  Current Outpatient Medications  Medication Sig Dispense Refill   acetaminophen (TYLENOL) 500 MG tablet Take 1,000 mg by mouth 2 (two) times daily as needed for mild pain.     amiodarone (PACERONE) 200 MG tablet Take 1 tablet (200 mg total) by mouth 2 (two) times daily for 5 days, THEN 1 tablet (200 mg total) daily. Follow up with cardiology  outpatient for refills. 40 tablet 0   atorvastatin (LIPITOR) 20 MG tablet TAKE 1 TABLET BY MOUTH EVERY DAY (Patient taking differently: Take 20 mg by mouth at bedtime.) 90 tablet 3   Calcium Citrate-Vitamin D (CALCIUM CITRATE PETITE/VIT D PO) Take 1 tablet by mouth 2 (two) times daily.     cyclobenzaprine (FLEXERIL) 5 MG tablet TAKE 1 TABLET BY MOUTH TWICE A DAY AS NEEDED FOR MUSCLE SPASM (Patient not taking: Reported on 09/15/2022) 30 tablet 1   famotidine (PEPCID) 20 MG tablet Take 20 mg by mouth daily.     lidocaine-prilocaine (EMLA) cream Apply 1 Application topically as needed. (Patient taking differently: Apply 1 Application topically as needed (port access).) 30 g 2   magnesium oxide (MAG-OX) 400 (240 Mg) MG tablet Take 1 tablet (400 mg total) by mouth 2 (two) times daily. Follow your magnesium level outpatient with your PCP 60 tablet 0   metoprolol tartrate (LOPRESSOR) 25 MG tablet Take 0.5 tablets (12.5 mg total) by mouth 2 (two) times daily. 30 tablet 1   potassium chloride (KLOR-CON M) 10 MEQ tablet Take 2 tablets (20 mEq total) by mouth daily for 14 days. Follow repeat labs with your PCP within 1 week 28 tablet 0   PRESCRIPTION MEDICATION Apply 1 application  topically in the morning and at bedtime. Unknown cream given to Pt from the cancer center for after radiation     prochlorperazine (COMPAZINE) 10 MG tablet Take 1 tablet (10 mg total) by mouth every 6 (  six) hours as needed for nausea or vomiting. 30 tablet 2   sucralfate (CARAFATE) 1 GM/10ML suspension Take 10 mLs (1 g total) by mouth 4 (four) times daily -  with meals and at bedtime. (Patient taking differently: Take 1 g by mouth 2 (two) times daily.) 420 mL 1   traMADol (ULTRAM) 50 MG tablet Take 0.5-1 tablets (25-50 mg total) by mouth 2 (two) times daily as needed. For chronic low back pain. May refill monthly (Patient taking differently: Take 25 mg by mouth daily as needed for moderate pain or severe pain.) 60 tablet 2   No current  facility-administered medications for this visit.    SURGICAL HISTORY:  Past Surgical History:  Procedure Laterality Date   BRONCHIAL NEEDLE ASPIRATION BIOPSY  06/30/2022   Procedure: BRONCHIAL NEEDLE ASPIRATION BIOPSIES;  Surgeon: [name], DO;  Location: [facility];  Service: Pulmonary;;   IR IMAGING GUIDED PORT INSERTION  07/31/2022   KYPHOPLASTY     2014   peridontal surgery     VIDEO BRONCHOSCOPY WITH ENDOBRONCHIAL ULTRASOUND N/A 06/30/2022   Procedure: VIDEO BRONCHOSCOPY WITH ENDOBRONCHIAL ULTRASOUND;  Surgeon: [name], DO;  Location: [facility];  Service: Pulmonary;  Laterality: N/A;    REVIEW OF SYSTEMS:  Constitutional: positive for fatigue Eyes: negative Ears, nose, mouth, throat, and face: negative Respiratory: negative Cardiovascular: negative Gastrointestinal: positive for nausea Genitourinary:negative Integument/breast: negative Hematologic/lymphatic: negative Musculoskeletal:negative Neurological: negative Behavioral/Psych: negative Endocrine: negative Allergic/Immunologic: negative   PHYSICAL EXAMINATION: General appearance: alert, cooperative, fatigued, and no distress Head: Normocephalic, without obvious abnormality, atraumatic Neck: no adenopathy, no JVD, supple, symmetrical, trachea midline, and thyroid not enlarged, symmetric, no tenderness/mass/nodules Lymph nodes: Cervical, supraclavicular, and axillary nodes normal. Resp: clear to auscultation bilaterally Back: symmetric, no curvature. ROM normal. No CVA tenderness. Cardio: regular rate and rhythm, S1, S2 normal, no murmur, click, rub or gallop GI: soft, non-tender; bowel sounds normal; no masses,  no organomegaly Extremities: extremities normal, atraumatic, no cyanosis or edema Neurologic: Alert and oriented X 3, normal strength and tone. Normal symmetric reflexes. Normal coordination and gait  ECOG PERFORMANCE STATUS: 1 - Symptomatic but completely ambulatory  Blood pressure  115/62, pulse 94, temperature 98.5 F (36.9 C), temperature source Oral, resp. rate 17, height 5' (1.524 m), weight 95 lb (43.1 kg), SpO2 100%.  LABORATORY DATA: Lab Results  Component Value Date   WBC 6.7 09/22/2022   HGB 9.2 (L) 09/22/2022   HCT 27.6 (L) 09/22/2022   MCV 96.8 09/22/2022   PLT 204 09/22/2022      Chemistry      Component Value Date/Time   NA 136 09/17/2022 1101   K 4.9 09/17/2022 1101   CL 101 09/17/2022 1101   CO2 30 09/17/2022 1101   BUN 10 09/17/2022 1101   CREATININE 0.89 09/17/2022 1101   CREATININE 1.18 (H) 02/19/2020 1048      Component Value Date/Time   CALCIUM 10.1 09/17/2022 1101   ALKPHOS 87 09/17/2022 1101   AST 11 (L) 09/17/2022 1101   ALT 7 09/17/2022 1101   BILITOT 0.6 09/17/2022 1101       RADIOGRAPHIC STUDIES: ECHOCARDIOGRAM COMPLETE  Result Date: 09/11/2022    ECHOCARDIOGRAM REPORT   Patient Name:   CHANCEY Wass Date of Exam: 09/11/2022 Medical Rec #:  [id number]      Height:       60.0 in Accession #:    [id number]     Weight:       104.0 lb Date of Birth:  08/09/1945      BSA:          1.414 m Patient Age:    77 years       BP:           114/48 mmHg Patient Gender: F              HR:           103 bpm. Exam Location:  Inpatient Procedure: 2D Echo, Cardiac Doppler, Color Doppler and Strain Analysis Indications:    A-Fib I48.91  History:        Patient has prior history of Echocardiogram examinations, most                 recent 11/13/2012. Chemo therapy, Arrythmias:Atrial Fibrillation;                 Risk Factors:Hypertension, Dyslipidemia and Former Smoker.  Sonographer:    Dondra Prader RVT RCS Referring Phys: [phone] A [name]  Sonographer Comments: Technically challenging study due to limited acoustic windows and Technically difficult study due to poor echo windows. IMPRESSIONS  1. Left ventricular ejection fraction, by estimation, is 60 to 65%. The left ventricle has normal function. The left ventricle has no regional wall motion  abnormalities. Left ventricular diastolic parameters were normal.  2. Right ventricular systolic function is normal. The right ventricular size is normal.  3. The mitral valve is abnormal. No evidence of mitral valve regurgitation. No evidence of mitral stenosis.  4. The aortic valve was not well visualized. There is mild calcification of the aortic valve. There is mild thickening of the aortic valve. Aortic valve regurgitation is trivial. Mild aortic valve stenosis.  5. The inferior vena cava is normal in size with greater than 50% respiratory variability, suggesting right atrial pressure of 3 mmHg. FINDINGS  Left Ventricle: Left ventricular ejection fraction, by estimation, is 60 to 65%. The left ventricle has normal function. The left ventricle has no regional wall motion abnormalities. The left ventricular internal cavity size was normal in size. There is  no left ventricular hypertrophy. Left ventricular diastolic parameters were normal. Right Ventricle: The right ventricular size is normal. No increase in right ventricular wall thickness. Right ventricular systolic function is normal. Left Atrium: Left atrial size was normal in size. Right Atrium: Right atrial size was normal in size. Pericardium: There is no evidence of pericardial effusion. Mitral Valve: The mitral valve is abnormal. There is mild thickening of the mitral valve leaflet(s). No evidence of mitral valve regurgitation. No evidence of mitral valve stenosis. Tricuspid Valve: The tricuspid valve is normal in structure. Tricuspid valve regurgitation is not demonstrated. No evidence of tricuspid stenosis. Aortic Valve: The aortic valve was not well visualized. There is mild calcification of the aortic valve. There is mild thickening of the aortic valve. Aortic valve regurgitation is trivial. Aortic regurgitation PHT measures 309 msec. Mild aortic stenosis  is present. Aortic valve mean gradient measures 6.0 mmHg. Aortic valve peak gradient measures  11.7 mmHg. Aortic valve area, by VTI measures 1.70 cm. Pulmonic Valve: The pulmonic valve was normal in structure. Pulmonic valve regurgitation is mild. No evidence of pulmonic stenosis. Aorta: The aortic root is normal in size and structure and the ascending aorta was not well visualized. Venous: The inferior vena cava is normal in size with greater than 50% respiratory variability, suggesting right atrial pressure of 3 mmHg. IAS/Shunts: No atrial level shunt detected by color flow Doppler.  LEFT VENTRICLE PLAX 2D  LVIDd:         3.50 cm   Diastology LVIDs:         2.30 cm   LV e' medial:    9.57 cm/s LV PW:         0.80 cm   LV E/e' medial:  11.6 LV IVS:        0.80 cm   LV e' lateral:   8.92 cm/s LVOT diam:     1.60 cm   LV E/e' lateral: 12.4 LV SV:         41 LV SV Index:   29 LVOT Area:     2.01 cm  RIGHT VENTRICLE             IVC RV Basal diam:  2.90 cm     IVC diam: 1.90 cm RV S prime:     17.00 cm/s TAPSE (M-mode): 3.0 cm LEFT ATRIUM             Index        RIGHT ATRIUM          Index LA diam:        3.00 cm 2.12 cm/m   RA Area:     9.31 cm LA Vol (A2C):   42.1 ml 29.78 ml/m  RA Volume:   20.90 ml 14.78 ml/m LA Vol (A4C):   36.7 ml 25.96 ml/m LA Biplane Vol: 38.9 ml 27.52 ml/m  AORTIC VALVE                     PULMONIC VALVE AV Area (Vmax):    1.56 cm      PV Vmax:       0.78 m/s AV Area (Vmean):   1.58 cm      PV Peak grad:  2.5 mmHg AV Area (VTI):     1.70 cm AV Vmax:           171.00 cm/s AV Vmean:          115.000 cm/s AV VTI:            0.242 m AV Peak Grad:      11.7 mmHg AV Mean Grad:      6.0 mmHg LVOT Vmax:         133.00 cm/s LVOT Vmean:        90.300 cm/s LVOT VTI:          0.205 m LVOT/AV VTI ratio: 0.85 AI PHT:            309 msec  AORTA Ao Root diam: 2.40 cm MITRAL VALVE MV Area (PHT): 4.83 cm     SHUNTS MV Decel Time: 157 msec     Systemic VTI:  0.20 m MV E velocity: 111.00 cm/s  Systemic Diam: 1.60 cm MV A velocity: 129.00 cm/s MV E/A ratio:  0.86 Charlton Haws MD Electronically  signed by Charlton Haws MD Signature Date/Time: 09/11/2022/2:19:22 PM    Final    DG Chest 2 View  Result Date: 09/07/2022 CLINICAL DATA:  Dyspnea EXAM: CHEST - 2 VIEW COMPARISON:  Previous studies including CT chest done on 08/20/2022 FINDINGS: Apparent shift of mediastinum to the right may be due to rotation. Cardiac size is within normal limits. There are no signs of pulmonary edema or focal pulmonary consolidation. Increase in AP diameter of chest suggests COPD. There is no pleural effusion or pneumothorax. There is decrease in height of multiple thoracic vertebral bodies with no significant interval change.  There is previous vertebroplasty in 1 of the lower thoracic vertebral bodies. Tip of right IJ chest port is seen in superior vena cava. IMPRESSION: COPD. There are no signs of pulmonary edema or focal pulmonary consolidation. Electronically Signed   By: Ernie Avena M.D.   On: 09/07/2022 13:06    ASSESSMENT AND PLAN: This is a very pleasant 77 years old white female with limited stage (T2a, N2, M0) small cell lung cancer presented with left lower lobe lung mass in addition to left hilar and mediastinal lymphadenopathy diagnosed in April 2024.  The patient is currently undergoing a course of systemic chemotherapy with carboplatin for AUC of 5 on day 1 and 2 etoposide 100 Mg/M2 on days 1, 2 and 3 concurrent with radiation.  She is status post 3  cycles. The patient has been tolerating this treatment well but she was admitted after cycle #3 to the hospital with pancytopenia requiring platelets and PRBCs transfusion.  She was also treated with a course of antibiotic for neutropenic fever. She is feeling much better now and her count recovery except for the mild anemia. I recommended for her to proceed with cycle #4 today as planned. I will see her back for follow-up visit in 1 months with repeat CT scan of the chest for restaging of her disease. The patient was advised to call immediately if she  has any other concerning symptoms in the interval. The patient voices understanding of current disease status and treatment options and is in agreement with the current care plan. All questions were answered. The patient knows to call the clinic with any problems, questions or concerns. We can certainly see the patient much sooner if necessary.  The total time spent in the appointment was 30 minutes.  Disclaimer: This note was dictated with voice recognition software. Similar sounding words can inadvertently be transcribed and may not be corrected upon review.

## 2022-09-22 NOTE — Patient Instructions (Signed)
[facility]  Discharge Instructions: Thank you for choosing [facility] to provide your oncology and hematology care.   If you have a lab appointment with the Cancer Center, please go directly to the Cancer Center and check in at the registration area.   Wear comfortable clothing and clothing appropriate for easy access to any Portacath or PICC line.   We strive to give you quality time with your provider. You may need to reschedule your appointment if you arrive late (15 or more minutes).  Arriving late affects you and other patients whose appointments are after yours.  Also, if you miss three or more appointments without notifying the office, you may be dismissed from the clinic at the provider's discretion.      For prescription refill requests, have your pharmacy contact our office and allow 72 hours for refills to be completed.    Today you received the following chemotherapy and/or immunotherapy agents: Carboplatin, Etoposide.       To help prevent nausea and vomiting after your treatment, we encourage you to take your nausea medication as directed.  BELOW ARE SYMPTOMS THAT SHOULD BE REPORTED IMMEDIATELY: *FEVER GREATER THAN 100.4 F (38 C) OR HIGHER *CHILLS OR SWEATING *NAUSEA AND VOMITING THAT IS NOT CONTROLLED WITH YOUR NAUSEA MEDICATION *UNUSUAL SHORTNESS OF BREATH *UNUSUAL BRUISING OR BLEEDING *URINARY PROBLEMS (pain or burning when urinating, or frequent urination) *BOWEL PROBLEMS (unusual diarrhea, constipation, pain near the anus) TENDERNESS IN MOUTH AND THROAT WITH OR WITHOUT PRESENCE OF ULCERS (sore throat, sores in mouth, or a toothache) UNUSUAL RASH, SWELLING OR PAIN  UNUSUAL VAGINAL DISCHARGE OR ITCHING   Items with * indicate a potential emergency and should be followed up as soon as possible or go to the Emergency Department if any problems should occur.  Please show the CHEMOTHERAPY ALERT CARD or IMMUNOTHERAPY  ALERT CARD at check-in to the Emergency Department and triage nurse.  Should you have questions after your visit or need to cancel or reschedule your appointment, please contact [facility]  Dept: [phone]  and follow the prompts.  Office hours are 8:00 a.m. to 4:30 p.m. Monday - Friday. Please note that voicemails left after 4:00 p.m. may not be returned until the following business day.  We are closed weekends and major holidays. You have access to a nurse at all times for urgent questions. Please call the main number to the clinic Dept: [phone] and follow the prompts.   For any non-urgent questions, you may also contact your provider using MyChart. We now offer e-Visits for anyone 18 and older to request care online for non-urgent symptoms. For details visit [email or web address].   Also download the MyChart app! Go to the app store, search "MyChart", open the app, select [facility], and log in with your MyChart username and password.   

## 2022-09-23 ENCOUNTER — Ambulatory Visit: Admission: RE | Admit: 2022-09-23 | Payer: 59 | Source: Ambulatory Visit

## 2022-09-23 ENCOUNTER — Other Ambulatory Visit: Payer: Self-pay

## 2022-09-23 ENCOUNTER — Ambulatory Visit: Payer: 59

## 2022-09-23 ENCOUNTER — Inpatient Hospital Stay: Payer: 59

## 2022-09-23 VITALS — BP 117/62 | HR 67 | Temp 97.8°F | Resp 16

## 2022-09-23 DIAGNOSIS — Z79899 Other long term (current) drug therapy: Secondary | ICD-10-CM | POA: Diagnosis not present

## 2022-09-23 DIAGNOSIS — Z5111 Encounter for antineoplastic chemotherapy: Secondary | ICD-10-CM | POA: Diagnosis not present

## 2022-09-23 DIAGNOSIS — C3432 Malignant neoplasm of lower lobe, left bronchus or lung: Secondary | ICD-10-CM | POA: Diagnosis not present

## 2022-09-23 DIAGNOSIS — Z87891 Personal history of nicotine dependence: Secondary | ICD-10-CM | POA: Diagnosis not present

## 2022-09-23 DIAGNOSIS — Z5189 Encounter for other specified aftercare: Secondary | ICD-10-CM | POA: Diagnosis not present

## 2022-09-23 DIAGNOSIS — Z510 Encounter for antineoplastic radiation therapy: Secondary | ICD-10-CM | POA: Diagnosis not present

## 2022-09-23 LAB — RAD ONC ARIA SESSION SUMMARY
Course Elapsed Days: 50
Plan Fractions Treated to Date: 27
Plan Prescribed Dose Per Fraction: 2 Gy
Plan Total Fractions Prescribed: 30
Plan Total Prescribed Dose: 60 Gy
Reference Point Dosage Given to Date: 54 Gy
Reference Point Session Dosage Given: 2 Gy
Session Number: 27

## 2022-09-23 MED ORDER — HEPARIN SOD (PORK) LOCK FLUSH 100 UNIT/ML IV SOLN
500.0000 [IU] | Freq: Once | INTRAVENOUS | Status: AC | PRN
  Administered 2022-09-23: 500 [IU]

## 2022-09-23 MED ORDER — SODIUM CHLORIDE 0.9 % IV SOLN
10.0000 mg | Freq: Once | INTRAVENOUS | Status: AC
  Administered 2022-09-23: 10 mg via INTRAVENOUS
  Filled 2022-09-23: qty 1
  Filled 2022-09-23: qty 10

## 2022-09-23 MED ORDER — SODIUM CHLORIDE 0.9 % IV SOLN: Freq: Once | INTRAVENOUS | Status: AC

## 2022-09-23 MED ORDER — SODIUM CHLORIDE 0.9 % IV SOLN
80.0000 mg/m2 | Freq: Once | INTRAVENOUS | Status: AC
  Administered 2022-09-23: 114 mg via INTRAVENOUS
  Filled 2022-09-23: qty 5.7

## 2022-09-23 MED ORDER — SODIUM CHLORIDE 0.9% FLUSH
10.0000 mL | INTRAVENOUS | Status: DC | PRN
  Administered 2022-09-23: 10 mL

## 2022-09-23 MED FILL — Dexamethasone Sodium Phosphate Inj 100 MG/10ML: INTRAMUSCULAR | Qty: 1 | Status: AC

## 2022-09-23 NOTE — Patient Instructions (Signed)
[facility]  Discharge Instructions: Thank you for choosing [facility] to provide your oncology and hematology care.   If you have a lab appointment with the Cancer Center, please go directly to the Cancer Center and check in at the registration area.   Wear comfortable clothing and clothing appropriate for easy access to any Portacath or PICC line.   We strive to give you quality time with your provider. You may need to reschedule your appointment if you arrive late (15 or more minutes).  Arriving late affects you and other patients whose appointments are after yours.  Also, if you miss three or more appointments without notifying the office, you may be dismissed from the clinic at the provider's discretion.      For prescription refill requests, have your pharmacy contact our office and allow 72 hours for refills to be completed.    Today you received the following chemotherapy and/or immunotherapy agents; Etoposide      To help prevent nausea and vomiting after your treatment, we encourage you to take your nausea medication as directed.  BELOW ARE SYMPTOMS THAT SHOULD BE REPORTED IMMEDIATELY: *FEVER GREATER THAN 100.4 F (38 C) OR HIGHER *CHILLS OR SWEATING *NAUSEA AND VOMITING THAT IS NOT CONTROLLED WITH YOUR NAUSEA MEDICATION *UNUSUAL SHORTNESS OF BREATH *UNUSUAL BRUISING OR BLEEDING *URINARY PROBLEMS (pain or burning when urinating, or frequent urination) *BOWEL PROBLEMS (unusual diarrhea, constipation, pain near the anus) TENDERNESS IN MOUTH AND THROAT WITH OR WITHOUT PRESENCE OF ULCERS (sore throat, sores in mouth, or a toothache) UNUSUAL RASH, SWELLING OR PAIN  UNUSUAL VAGINAL DISCHARGE OR ITCHING   Items with * indicate a potential emergency and should be followed up as soon as possible or go to the Emergency Department if any problems should occur.  Please show the CHEMOTHERAPY ALERT CARD or IMMUNOTHERAPY ALERT CARD at  check-in to the Emergency Department and triage nurse.  Should you have questions after your visit or need to cancel or reschedule your appointment, please contact [facility]  Dept: [phone]  and follow the prompts.  Office hours are 8:00 a.m. to 4:30 p.m. Monday - Friday. Please note that voicemails left after 4:00 p.m. may not be returned until the following business day.  We are closed weekends and major holidays. You have access to a nurse at all times for urgent questions. Please call the main number to the clinic Dept: [phone] and follow the prompts.   For any non-urgent questions, you may also contact your provider using MyChart. We now offer e-Visits for anyone 18 and older to request care online for non-urgent symptoms. For details visit [email or web address].   Also download the MyChart app! Go to the app store, search "MyChart", open the app, select [facility], and log in with your MyChart username and password.   

## 2022-09-24 ENCOUNTER — Inpatient Hospital Stay: Payer: 59 | Admitting: Nutrition

## 2022-09-24 ENCOUNTER — Other Ambulatory Visit: Payer: Self-pay

## 2022-09-24 ENCOUNTER — Inpatient Hospital Stay: Payer: 59

## 2022-09-24 ENCOUNTER — Ambulatory Visit: Payer: 59

## 2022-09-24 ENCOUNTER — Ambulatory Visit
Admission: RE | Admit: 2022-09-24 | Discharge: 2022-09-24 | Disposition: A | Discharge status: Home / Self Care | Payer: 59 | Source: Ambulatory Visit | Attending: Radiation Oncology | Admitting: Radiation Oncology

## 2022-09-24 VITALS — BP 117/50 | HR 65 | Temp 97.8°F | Resp 20

## 2022-09-24 DIAGNOSIS — Z87891 Personal history of nicotine dependence: Secondary | ICD-10-CM | POA: Diagnosis not present

## 2022-09-24 DIAGNOSIS — Z79899 Other long term (current) drug therapy: Secondary | ICD-10-CM | POA: Diagnosis not present

## 2022-09-24 DIAGNOSIS — Z5111 Encounter for antineoplastic chemotherapy: Secondary | ICD-10-CM | POA: Diagnosis not present

## 2022-09-24 DIAGNOSIS — C3432 Malignant neoplasm of lower lobe, left bronchus or lung: Secondary | ICD-10-CM | POA: Diagnosis not present

## 2022-09-24 DIAGNOSIS — Z5189 Encounter for other specified aftercare: Secondary | ICD-10-CM | POA: Diagnosis not present

## 2022-09-24 DIAGNOSIS — Z510 Encounter for antineoplastic radiation therapy: Secondary | ICD-10-CM | POA: Diagnosis not present

## 2022-09-24 LAB — RAD ONC ARIA SESSION SUMMARY
Course Elapsed Days: 51
Plan Fractions Treated to Date: 28
Plan Prescribed Dose Per Fraction: 2 Gy
Plan Total Fractions Prescribed: 30
Plan Total Prescribed Dose: 60 Gy
Reference Point Dosage Given to Date: 56 Gy
Reference Point Session Dosage Given: 2 Gy
Session Number: 28

## 2022-09-24 MED ORDER — SODIUM CHLORIDE 0.9 % IV SOLN
80.0000 mg/m2 | Freq: Once | INTRAVENOUS | Status: AC
  Administered 2022-09-24: 114 mg via INTRAVENOUS
  Filled 2022-09-24: qty 5.7

## 2022-09-24 MED ORDER — SODIUM CHLORIDE 0.9 % IV SOLN
10.0000 mg | Freq: Once | INTRAVENOUS | Status: AC
  Administered 2022-09-24: 10 mg via INTRAVENOUS
  Filled 2022-09-24: qty 10

## 2022-09-24 MED ORDER — HEPARIN SOD (PORK) LOCK FLUSH 100 UNIT/ML IV SOLN
500.0000 [IU] | Freq: Once | INTRAVENOUS | Status: AC | PRN
  Administered 2022-09-24: 500 [IU]

## 2022-09-24 MED ORDER — SODIUM CHLORIDE 0.9% FLUSH
10.0000 mL | INTRAVENOUS | Status: DC | PRN
  Administered 2022-09-24: 10 mL

## 2022-09-24 MED ORDER — SODIUM CHLORIDE 0.9 % IV SOLN: Freq: Once | INTRAVENOUS | Status: AC

## 2022-09-24 NOTE — Progress Notes (Signed)
Nutrition follow-up completed with patient during infusion for limited stage small cell lung cancer.  Patient is followed by Dr. Arbutus Ped.  Final radiation treatment is scheduled for August 1.  Weight documented as 95 pounds decreased from 99 pounds 3 ounces June 25.  Labs were reviewed.  Patient reports she is feeling much better since discharged from the hospital.  (July 8-15) She had a very poor appetite and did not eat much during her stay.  Her appetite has improved since discharge.  She is not fond of oral nutrition supplements but states she is going to start making milkshakes from scratch.  She uses fair life milk and would like a protein powder recommendation.  She is very excited that today is her last chemotherapy treatment and she will finish radiation soon.  Nutrition diagnosis: Unintended weight loss continues.  Intervention: Enforced importance of eating small amounts of food frequently throughout the day. Encouraged her to focus on high-calorie foods. Provided recommendations for adding high-protein Carnation breakfast essential powder or a whey protein powder to milk and ice cream to prepare milkshake.  Monitoring, evaluation, goals: Patient will tolerate increased calories and protein to minimize further weight loss.  Next visit: Patient will contact RD with questions and concerns.  **Disclaimer: This note was dictated with voice recognition software. Similar sounding words can inadvertently be transcribed and this note may contain transcription errors which may not have been corrected upon publication of note.**

## 2022-09-24 NOTE — Patient Instructions (Signed)
[facility]  Discharge Instructions: Thank you for choosing [facility] to provide your oncology and hematology care.   If you have a lab appointment with the Cancer Center, please go directly to the Cancer Center and check in at the registration area.   Wear comfortable clothing and clothing appropriate for easy access to any Portacath or PICC line.   We strive to give you quality time with your provider. You may need to reschedule your appointment if you arrive late (15 or more minutes).  Arriving late affects you and other patients whose appointments are after yours.  Also, if you miss three or more appointments without notifying the office, you may be dismissed from the clinic at the provider's discretion.      For prescription refill requests, have your pharmacy contact our office and allow 72 hours for refills to be completed.    Today you received the following chemotherapy and/or immunotherapy agents; Etoposide      To help prevent nausea and vomiting after your treatment, we encourage you to take your nausea medication as directed.  BELOW ARE SYMPTOMS THAT SHOULD BE REPORTED IMMEDIATELY: *FEVER GREATER THAN 100.4 F (38 C) OR HIGHER *CHILLS OR SWEATING *NAUSEA AND VOMITING THAT IS NOT CONTROLLED WITH YOUR NAUSEA MEDICATION *UNUSUAL SHORTNESS OF BREATH *UNUSUAL BRUISING OR BLEEDING *URINARY PROBLEMS (pain or burning when urinating, or frequent urination) *BOWEL PROBLEMS (unusual diarrhea, constipation, pain near the anus) TENDERNESS IN MOUTH AND THROAT WITH OR WITHOUT PRESENCE OF ULCERS (sore throat, sores in mouth, or a toothache) UNUSUAL RASH, SWELLING OR PAIN  UNUSUAL VAGINAL DISCHARGE OR ITCHING   Items with * indicate a potential emergency and should be followed up as soon as possible or go to the Emergency Department if any problems should occur.  Please show the CHEMOTHERAPY ALERT CARD or IMMUNOTHERAPY ALERT CARD at  check-in to the Emergency Department and triage nurse.  Should you have questions after your visit or need to cancel or reschedule your appointment, please contact [facility]  Dept: [phone]  and follow the prompts.  Office hours are 8:00 a.m. to 4:30 p.m. Monday - Friday. Please note that voicemails left after 4:00 p.m. may not be returned until the following business day.  We are closed weekends and major holidays. You have access to a nurse at all times for urgent questions. Please call the main number to the clinic Dept: [phone] and follow the prompts.   For any non-urgent questions, you may also contact your provider using MyChart. We now offer e-Visits for anyone 18 and older to request care online for non-urgent symptoms. For details visit [email or web address].   Also download the MyChart app! Go to the app store, search "MyChart", open the app, select [facility], and log in with your MyChart username and password.   

## 2022-09-25 ENCOUNTER — Ambulatory Visit: Payer: 59

## 2022-09-25 ENCOUNTER — Inpatient Hospital Stay: Payer: 59

## 2022-09-25 ENCOUNTER — Ambulatory Visit: Admission: RE | Admit: 2022-09-25 | Payer: 59 | Source: Ambulatory Visit

## 2022-09-25 ENCOUNTER — Other Ambulatory Visit: Payer: Self-pay

## 2022-09-25 VITALS — BP 136/55 | HR 61 | Temp 97.8°F | Resp 20

## 2022-09-25 DIAGNOSIS — C3432 Malignant neoplasm of lower lobe, left bronchus or lung: Secondary | ICD-10-CM

## 2022-09-25 DIAGNOSIS — Z79899 Other long term (current) drug therapy: Secondary | ICD-10-CM | POA: Diagnosis not present

## 2022-09-25 DIAGNOSIS — Z5189 Encounter for other specified aftercare: Secondary | ICD-10-CM | POA: Diagnosis not present

## 2022-09-25 DIAGNOSIS — Z510 Encounter for antineoplastic radiation therapy: Secondary | ICD-10-CM | POA: Diagnosis not present

## 2022-09-25 DIAGNOSIS — Z87891 Personal history of nicotine dependence: Secondary | ICD-10-CM | POA: Diagnosis not present

## 2022-09-25 DIAGNOSIS — Z5111 Encounter for antineoplastic chemotherapy: Secondary | ICD-10-CM | POA: Diagnosis not present

## 2022-09-25 LAB — RAD ONC ARIA SESSION SUMMARY
Course Elapsed Days: 52
Plan Fractions Treated to Date: 29
Plan Prescribed Dose Per Fraction: 2 Gy
Plan Total Fractions Prescribed: 30
Plan Total Prescribed Dose: 60 Gy
Reference Point Dosage Given to Date: 58 Gy
Reference Point Session Dosage Given: 2 Gy
Session Number: 29

## 2022-09-25 MED ORDER — PEGFILGRASTIM-CBQV 6 MG/0.6ML ~~LOC~~ SOSY
6.0000 mg | PREFILLED_SYRINGE | Freq: Once | SUBCUTANEOUS | Status: AC
  Administered 2022-09-25: 6 mg via SUBCUTANEOUS
  Filled 2022-09-25: qty 0.6

## 2022-09-28 ENCOUNTER — Other Ambulatory Visit: Payer: Self-pay

## 2022-09-28 ENCOUNTER — Ambulatory Visit: Payer: 59

## 2022-09-28 ENCOUNTER — Ambulatory Visit
Admission: RE | Admit: 2022-09-28 | Discharge: 2022-09-28 | Disposition: A | Discharge status: Home / Self Care | Payer: 59 | Source: Ambulatory Visit | Attending: Radiation Oncology | Admitting: Radiation Oncology

## 2022-09-28 DIAGNOSIS — Z510 Encounter for antineoplastic radiation therapy: Secondary | ICD-10-CM | POA: Diagnosis not present

## 2022-09-28 DIAGNOSIS — C3432 Malignant neoplasm of lower lobe, left bronchus or lung: Secondary | ICD-10-CM | POA: Diagnosis not present

## 2022-09-28 DIAGNOSIS — Z5189 Encounter for other specified aftercare: Secondary | ICD-10-CM | POA: Diagnosis not present

## 2022-09-28 DIAGNOSIS — Z79899 Other long term (current) drug therapy: Secondary | ICD-10-CM | POA: Diagnosis not present

## 2022-09-28 DIAGNOSIS — Z5111 Encounter for antineoplastic chemotherapy: Secondary | ICD-10-CM | POA: Diagnosis not present

## 2022-09-28 DIAGNOSIS — Z87891 Personal history of nicotine dependence: Secondary | ICD-10-CM | POA: Diagnosis not present

## 2022-09-28 LAB — RAD ONC ARIA SESSION SUMMARY
Course Elapsed Days: 55
Plan Fractions Treated to Date: 30
Plan Prescribed Dose Per Fraction: 2 Gy
Plan Total Fractions Prescribed: 30
Plan Total Prescribed Dose: 60 Gy
Reference Point Dosage Given to Date: 60 Gy
Reference Point Session Dosage Given: 2 Gy
Session Number: 30

## 2022-09-29 ENCOUNTER — Ambulatory Visit
Admission: RE | Admit: 2022-09-29 | Discharge: 2022-09-29 | Disposition: A | Discharge status: Home / Self Care | Payer: 59 | Source: Ambulatory Visit | Attending: Radiation Oncology | Admitting: Radiation Oncology

## 2022-09-29 ENCOUNTER — Inpatient Hospital Stay: Payer: 59

## 2022-09-29 ENCOUNTER — Other Ambulatory Visit: Payer: Self-pay

## 2022-09-29 ENCOUNTER — Ambulatory Visit: Payer: 59

## 2022-09-29 ENCOUNTER — Encounter: Payer: Self-pay | Admitting: Internal Medicine

## 2022-09-29 DIAGNOSIS — C3432 Malignant neoplasm of lower lobe, left bronchus or lung: Secondary | ICD-10-CM | POA: Diagnosis not present

## 2022-09-29 DIAGNOSIS — Z87891 Personal history of nicotine dependence: Secondary | ICD-10-CM | POA: Diagnosis not present

## 2022-09-29 DIAGNOSIS — Z5189 Encounter for other specified aftercare: Secondary | ICD-10-CM | POA: Diagnosis not present

## 2022-09-29 DIAGNOSIS — Z510 Encounter for antineoplastic radiation therapy: Secondary | ICD-10-CM | POA: Diagnosis not present

## 2022-09-29 DIAGNOSIS — Z79899 Other long term (current) drug therapy: Secondary | ICD-10-CM | POA: Diagnosis not present

## 2022-09-29 DIAGNOSIS — Z5111 Encounter for antineoplastic chemotherapy: Secondary | ICD-10-CM | POA: Diagnosis not present

## 2022-09-29 LAB — CMP (CANCER CENTER ONLY)
ALT: 7 U/L (ref 0–44)
AST: 11 U/L — ABNORMAL LOW (ref 15–41)
Albumin: 3.7 g/dL (ref 3.5–5.0)
Alkaline Phosphatase: 81 U/L (ref 38–126)
Anion gap: 6 (ref 5–15)
BUN: 14 mg/dL (ref 8–23)
CO2: 30 mmol/L (ref 22–32)
Calcium: 10 mg/dL (ref 8.9–10.3)
Chloride: 100 mmol/L (ref 98–111)
Creatinine: 0.83 mg/dL (ref 0.44–1.00)
GFR, Estimated: >60 mL/min (ref >60)
Glucose, Bld: 106 mg/dL — ABNORMAL HIGH (ref 70–99)
Potassium: 4.9 mmol/L (ref 3.5–5.1)
Sodium: 136 mmol/L (ref 135–145)
Total Bilirubin: 0.8 mg/dL (ref 0.3–1.2)
Total Protein: 6.2 g/dL — ABNORMAL LOW (ref 6.5–8.1)

## 2022-09-29 LAB — CBC WITH DIFFERENTIAL (CANCER CENTER ONLY)
Abs Immature Granulocytes: 0.12 10*3/uL — ABNORMAL HIGH (ref 0.00–0.07)
Basophils Absolute: 0 10*3/uL (ref 0.0–0.1)
Basophils Relative: 1 %
Eosinophils Absolute: 0 10*3/uL (ref 0.0–0.5)
Eosinophils Relative: 2 %
HCT: 24.3 % — ABNORMAL LOW (ref 36.0–46.0)
Hemoglobin: 8.4 g/dL — ABNORMAL LOW (ref 12.0–15.0)
Immature Granulocytes: 10 %
Lymphocytes Relative: 10 %
Lymphs Abs: 0.1 10*3/uL — ABNORMAL LOW (ref 0.7–4.0)
MCH: 34 pg (ref 26.0–34.0)
MCHC: 34.6 g/dL (ref 30.0–36.0)
MCV: 98.4 fL (ref 80.0–100.0)
Monocytes Absolute: 0 10*3/uL — ABNORMAL LOW (ref 0.1–1.0)
Monocytes Relative: 2 %
Neutro Abs: 1 10*3/uL — ABNORMAL LOW (ref 1.7–7.7)
Neutrophils Relative %: 75 %
Platelet Count: 74 10*3/uL — ABNORMAL LOW (ref 150–400)
RBC: 2.47 MIL/uL — ABNORMAL LOW (ref 3.87–5.11)
RDW: 17.1 % — ABNORMAL HIGH (ref 11.5–15.5)
Smear Review: DECREASED
WBC Count: 1.3 10*3/uL — ABNORMAL LOW (ref 4.0–10.5)
nRBC: 0 % (ref 0.0–0.2)

## 2022-09-29 LAB — SAMPLE TO BLOOD BANK

## 2022-09-29 LAB — RAD ONC ARIA SESSION SUMMARY
Course Elapsed Days: 56
Plan Fractions Treated to Date: 1
Plan Prescribed Dose Per Fraction: 2 Gy
Plan Total Fractions Prescribed: 3
Plan Total Prescribed Dose: 6 Gy
Reference Point Dosage Given to Date: 2 Gy
Reference Point Session Dosage Given: 2 Gy
Session Number: 31

## 2022-09-30 ENCOUNTER — Other Ambulatory Visit: Payer: Self-pay

## 2022-09-30 ENCOUNTER — Encounter: Payer: Self-pay | Admitting: Internal Medicine

## 2022-09-30 ENCOUNTER — Ambulatory Visit: Payer: 59

## 2022-09-30 ENCOUNTER — Ambulatory Visit
Admission: RE | Admit: 2022-09-30 | Discharge: 2022-09-30 | Disposition: A | Discharge status: Home / Self Care | Payer: 59 | Source: Ambulatory Visit | Attending: Radiation Oncology | Admitting: Radiation Oncology

## 2022-09-30 DIAGNOSIS — Z79899 Other long term (current) drug therapy: Secondary | ICD-10-CM | POA: Diagnosis not present

## 2022-09-30 DIAGNOSIS — C3432 Malignant neoplasm of lower lobe, left bronchus or lung: Secondary | ICD-10-CM | POA: Diagnosis not present

## 2022-09-30 DIAGNOSIS — Z87891 Personal history of nicotine dependence: Secondary | ICD-10-CM | POA: Diagnosis not present

## 2022-09-30 DIAGNOSIS — Z5189 Encounter for other specified aftercare: Secondary | ICD-10-CM | POA: Diagnosis not present

## 2022-09-30 DIAGNOSIS — Z5111 Encounter for antineoplastic chemotherapy: Secondary | ICD-10-CM | POA: Diagnosis not present

## 2022-09-30 LAB — RAD ONC ARIA SESSION SUMMARY
Course Elapsed Days: 57
Plan Fractions Treated to Date: 2
Plan Prescribed Dose Per Fraction: 2 Gy
Plan Total Fractions Prescribed: 3
Plan Total Prescribed Dose: 6 Gy
Reference Point Dosage Given to Date: 4 Gy
Reference Point Session Dosage Given: 2 Gy
Session Number: 32

## 2022-10-01 ENCOUNTER — Ambulatory Visit
Admission: RE | Admit: 2022-10-01 | Discharge: 2022-10-01 | Disposition: A | Discharge status: Home / Self Care | Payer: 59 | Source: Ambulatory Visit | Attending: Radiation Oncology | Admitting: Radiation Oncology

## 2022-10-01 ENCOUNTER — Other Ambulatory Visit: Payer: Self-pay

## 2022-10-01 DIAGNOSIS — C3432 Malignant neoplasm of lower lobe, left bronchus or lung: Secondary | ICD-10-CM | POA: Insufficient documentation

## 2022-10-01 DIAGNOSIS — Z510 Encounter for antineoplastic radiation therapy: Secondary | ICD-10-CM | POA: Diagnosis not present

## 2022-10-01 DIAGNOSIS — Z87891 Personal history of nicotine dependence: Secondary | ICD-10-CM | POA: Insufficient documentation

## 2022-10-01 LAB — RAD ONC ARIA SESSION SUMMARY
Course Elapsed Days: 58
Plan Fractions Treated to Date: 3
Plan Prescribed Dose Per Fraction: 2 Gy
Plan Total Fractions Prescribed: 3
Plan Total Prescribed Dose: 6 Gy
Reference Point Dosage Given to Date: 6 Gy
Reference Point Session Dosage Given: 2 Gy
Session Number: 33

## 2022-10-02 NOTE — Radiation Completion Notes (Signed)
  Radiation Oncology         [phone] ________________________________  Name: Sheena [name] MRN: [id number]  Date of Service: 10/01/2022  DOB: 1945-03-28  End of Treatment Note  Diagnosis:   Limited Stage Small Cell Carcinoma of the LLL.   Intent: Curative     ==========DELIVERED PLANS==========  First Treatment Date: 2022-08-04 - Last Treatment Date: 2022-10-01   Plan Name: Lung_L_Hyb Site: Lung, Left Technique: 3D Mode: Photon Dose Per Fraction: 2 Gy Prescribed Dose (Delivered / Prescribed): 60 Gy / 60 Gy Prescribed Fxs (Delivered / Prescribed): 30 / 30   Plan Name: Lung_L_Bst Site: Lung, Left Technique: 3D Mode: Photon Dose Per Fraction: 2 Gy Prescribed Dose (Delivered / Prescribed): 6 Gy / 6 Gy Prescribed Fxs (Delivered / Prescribed): 3 / 3     ==========ON TREATMENT VISIT DATES========== 2022-08-07, 2022-08-14, 2022-08-21, 2022-08-28, 2022-09-04, 2022-09-22, 2022-09-25, 2022-10-01     See weekly On Treatment Notes in Epic for details. The patient tolerated radiation. She developed fatigue and anticipated skin changes in the treatment field. Her main complaint was esophagitis which was treated with carafate.  The patient will receive a call in about one month from the radiation oncology department. She will continue follow up with Dr. Arbutus Ped as well. We will also revisit PCI by phone with the patient in the coming weeks.     Osker Mason, PAC

## 2022-10-03 ENCOUNTER — Other Ambulatory Visit: Payer: Self-pay

## 2022-10-06 ENCOUNTER — Other Ambulatory Visit: Payer: 59

## 2022-10-07 ENCOUNTER — Other Ambulatory Visit: Payer: Self-pay

## 2022-10-07 ENCOUNTER — Emergency Department (HOSPITAL_COMMUNITY): Payer: 59

## 2022-10-07 ENCOUNTER — Encounter (HOSPITAL_COMMUNITY): Payer: Self-pay | Admitting: Emergency Medicine

## 2022-10-07 ENCOUNTER — Inpatient Hospital Stay: Payer: 59 | Attending: Internal Medicine

## 2022-10-07 ENCOUNTER — Inpatient Hospital Stay (HOSPITAL_COMMUNITY)
Admission: EM | Admit: 2022-10-07 | Discharge: 2022-10-09 | DRG: 812 | Disposition: A | Discharge status: Home / Self Care | Payer: 59 | Attending: Internal Medicine | Admitting: Internal Medicine

## 2022-10-07 ENCOUNTER — Telehealth: Payer: Self-pay

## 2022-10-07 DIAGNOSIS — Z9221 Personal history of antineoplastic chemotherapy: Secondary | ICD-10-CM | POA: Diagnosis not present

## 2022-10-07 DIAGNOSIS — Z825 Family history of asthma and other chronic lower respiratory diseases: Secondary | ICD-10-CM

## 2022-10-07 DIAGNOSIS — Z79899 Other long term (current) drug therapy: Secondary | ICD-10-CM | POA: Diagnosis not present

## 2022-10-07 DIAGNOSIS — K219 Gastro-esophageal reflux disease without esophagitis: Secondary | ICD-10-CM | POA: Diagnosis not present

## 2022-10-07 DIAGNOSIS — N179 Acute kidney failure, unspecified: Secondary | ICD-10-CM | POA: Diagnosis present

## 2022-10-07 DIAGNOSIS — R778 Other specified abnormalities of plasma proteins: Secondary | ICD-10-CM | POA: Diagnosis not present

## 2022-10-07 DIAGNOSIS — E871 Hypo-osmolality and hyponatremia: Secondary | ICD-10-CM | POA: Diagnosis not present

## 2022-10-07 DIAGNOSIS — I5A Non-ischemic myocardial injury (non-traumatic): Secondary | ICD-10-CM | POA: Diagnosis present

## 2022-10-07 DIAGNOSIS — J439 Emphysema, unspecified: Secondary | ICD-10-CM | POA: Diagnosis not present

## 2022-10-07 DIAGNOSIS — D6481 Anemia due to antineoplastic chemotherapy: Secondary | ICD-10-CM | POA: Diagnosis not present

## 2022-10-07 DIAGNOSIS — I480 Paroxysmal atrial fibrillation: Secondary | ICD-10-CM | POA: Diagnosis not present

## 2022-10-07 DIAGNOSIS — C3432 Malignant neoplasm of lower lobe, left bronchus or lung: Secondary | ICD-10-CM | POA: Diagnosis present

## 2022-10-07 DIAGNOSIS — Z923 Personal history of irradiation: Secondary | ICD-10-CM

## 2022-10-07 DIAGNOSIS — R531 Weakness: Secondary | ICD-10-CM | POA: Diagnosis present

## 2022-10-07 DIAGNOSIS — T451X5A Adverse effect of antineoplastic and immunosuppressive drugs, initial encounter: Secondary | ICD-10-CM | POA: Diagnosis not present

## 2022-10-07 DIAGNOSIS — Z8249 Family history of ischemic heart disease and other diseases of the circulatory system: Secondary | ICD-10-CM

## 2022-10-07 DIAGNOSIS — R54 Age-related physical debility: Secondary | ICD-10-CM | POA: Diagnosis not present

## 2022-10-07 DIAGNOSIS — Z886 Allergy status to analgesic agent status: Secondary | ICD-10-CM | POA: Diagnosis not present

## 2022-10-07 DIAGNOSIS — R0602 Shortness of breath: Secondary | ICD-10-CM | POA: Diagnosis not present

## 2022-10-07 DIAGNOSIS — D696 Thrombocytopenia, unspecified: Secondary | ICD-10-CM | POA: Diagnosis not present

## 2022-10-07 DIAGNOSIS — I2510 Atherosclerotic heart disease of native coronary artery without angina pectoris: Secondary | ICD-10-CM | POA: Diagnosis present

## 2022-10-07 DIAGNOSIS — I700 Atherosclerosis of aorta: Secondary | ICD-10-CM | POA: Diagnosis not present

## 2022-10-07 DIAGNOSIS — E785 Hyperlipidemia, unspecified: Secondary | ICD-10-CM | POA: Diagnosis not present

## 2022-10-07 DIAGNOSIS — E876 Hypokalemia: Secondary | ICD-10-CM | POA: Diagnosis present

## 2022-10-07 DIAGNOSIS — D6959 Other secondary thrombocytopenia: Secondary | ICD-10-CM | POA: Diagnosis present

## 2022-10-07 DIAGNOSIS — Z87891 Personal history of nicotine dependence: Secondary | ICD-10-CM

## 2022-10-07 DIAGNOSIS — I10 Essential (primary) hypertension: Secondary | ICD-10-CM | POA: Diagnosis not present

## 2022-10-07 DIAGNOSIS — R7989 Other specified abnormal findings of blood chemistry: Secondary | ICD-10-CM

## 2022-10-07 DIAGNOSIS — J432 Centrilobular emphysema: Secondary | ICD-10-CM | POA: Diagnosis not present

## 2022-10-07 DIAGNOSIS — R000 Tachycardia, unspecified: Secondary | ICD-10-CM | POA: Diagnosis not present

## 2022-10-07 DIAGNOSIS — D649 Anemia, unspecified: Secondary | ICD-10-CM | POA: Diagnosis not present

## 2022-10-07 HISTORY — DX: Malignant neoplasm of lower lobe, left bronchus or lung: C34.32

## 2022-10-07 LAB — PREPARE RBC (CROSSMATCH)

## 2022-10-07 LAB — CMP (CANCER CENTER ONLY)
ALT: 8 U/L (ref 0–44)
AST: 11 U/L — ABNORMAL LOW (ref 15–41)
Albumin: 3.5 g/dL (ref 3.5–5.0)
Alkaline Phosphatase: 103 U/L (ref 38–126)
Anion gap: 9 (ref 5–15)
BUN: 15 mg/dL (ref 8–23)
CO2: 30 mmol/L (ref 22–32)
Calcium: 9.1 mg/dL (ref 8.9–10.3)
Chloride: 98 mmol/L (ref 98–111)
Creatinine: 0.98 mg/dL (ref 0.44–1.00)
GFR, Estimated: >60 mL/min (ref >60)
Glucose, Bld: 137 mg/dL — ABNORMAL HIGH (ref 70–99)
Potassium: 3.4 mmol/L — ABNORMAL LOW (ref 3.5–5.1)
Sodium: 137 mmol/L (ref 135–145)
Total Bilirubin: 0.4 mg/dL (ref 0.3–1.2)
Total Protein: 6.1 g/dL — ABNORMAL LOW (ref 6.5–8.1)

## 2022-10-07 LAB — CBC WITH DIFFERENTIAL/PLATELET
Abs Immature Granulocytes: 0.55 10*3/uL — ABNORMAL HIGH (ref 0.00–0.07)
Basophils Absolute: 0.1 10*3/uL (ref 0.0–0.1)
Basophils Relative: 1 %
Eosinophils Absolute: 0 10*3/uL (ref 0.0–0.5)
Eosinophils Relative: 0 %
HCT: 18.5 % — ABNORMAL LOW (ref 36.0–46.0)
Hemoglobin: 6 g/dL — CL (ref 12.0–15.0)
Immature Granulocytes: 3 %
Lymphocytes Relative: 3 %
Lymphs Abs: 0.6 10*3/uL — ABNORMAL LOW (ref 0.7–4.0)
MCH: 32.3 pg (ref 26.0–34.0)
MCHC: 32.4 g/dL (ref 30.0–36.0)
MCV: 99.5 fL (ref 80.0–100.0)
Monocytes Absolute: 1.6 10*3/uL — ABNORMAL HIGH (ref 0.1–1.0)
Monocytes Relative: 9 %
Neutro Abs: 15.5 10*3/uL — ABNORMAL HIGH (ref 1.7–7.7)
Neutrophils Relative %: 84 %
Platelets: 8 10*3/uL — CL (ref 150–400)
RBC: 1.86 MIL/uL — ABNORMAL LOW (ref 3.87–5.11)
RDW: 16.9 % — ABNORMAL HIGH (ref 11.5–15.5)
WBC: 18.4 10*3/uL — ABNORMAL HIGH (ref 4.0–10.5)
nRBC: 0 % (ref 0.0–0.2)

## 2022-10-07 LAB — PREPARE PLATELET PHERESIS: Unit division: 0

## 2022-10-07 LAB — CBC WITH DIFFERENTIAL (CANCER CENTER ONLY)
Abs Immature Granulocytes: 0.35 10*3/uL — ABNORMAL HIGH (ref 0.00–0.07)
Basophils Absolute: 0.1 10*3/uL (ref 0.0–0.1)
Basophils Relative: 1 %
Eosinophils Absolute: 0 10*3/uL (ref 0.0–0.5)
Eosinophils Relative: 0 %
HCT: 18.5 % — ABNORMAL LOW (ref 36.0–46.0)
Hemoglobin: 6.4 g/dL — CL (ref 12.0–15.0)
Immature Granulocytes: 3 %
Lymphocytes Relative: 4 %
Lymphs Abs: 0.5 10*3/uL — ABNORMAL LOW (ref 0.7–4.0)
MCH: 33.5 pg (ref 26.0–34.0)
MCHC: 34.6 g/dL (ref 30.0–36.0)
MCV: 96.9 fL (ref 80.0–100.0)
Monocytes Absolute: 1.3 10*3/uL — ABNORMAL HIGH (ref 0.1–1.0)
Monocytes Relative: 9 %
Neutro Abs: 11.7 10*3/uL — ABNORMAL HIGH (ref 1.7–7.7)
Neutrophils Relative %: 83 %
Platelet Count: 7 10*3/uL — CL (ref 150–400)
RBC: 1.91 MIL/uL — ABNORMAL LOW (ref 3.87–5.11)
RDW: 16.8 % — ABNORMAL HIGH (ref 11.5–15.5)
WBC Count: 14.1 10*3/uL — ABNORMAL HIGH (ref 4.0–10.5)
nRBC: 0 % (ref 0.0–0.2)

## 2022-10-07 LAB — TYPE AND SCREEN
ABO/RH(D): A NEG
Antibody Screen: NEGATIVE
Unit division: 0
Unit division: 0

## 2022-10-07 LAB — BPAM PLATELET PHERESIS
Blood Product Expiration Date: 202408102359
ISSUE DATE / TIME: 202408072338
Unit Type and Rh: 5100

## 2022-10-07 LAB — BPAM RBC
Blood Product Expiration Date: 202409042359
Blood Product Expiration Date: 202409042359
ISSUE DATE / TIME: 202408071820
ISSUE DATE / TIME: 202408072104
Unit Type and Rh: 600
Unit Type and Rh: 600

## 2022-10-07 LAB — COMPREHENSIVE METABOLIC PANEL
ALT: 12 U/L (ref 0–44)
AST: 19 U/L (ref 15–41)
Albumin: 3.3 g/dL — ABNORMAL LOW (ref 3.5–5.0)
Alkaline Phosphatase: 105 U/L (ref 38–126)
Anion gap: 11 (ref 5–15)
BUN: 17 mg/dL (ref 8–23)
CO2: 27 mmol/L (ref 22–32)
Calcium: 9.2 mg/dL (ref 8.9–10.3)
Chloride: 98 mmol/L (ref 98–111)
Creatinine, Ser: 1.2 mg/dL — ABNORMAL HIGH (ref 0.44–1.00)
GFR, Estimated: 47 mL/min — ABNORMAL LOW (ref >60)
Glucose, Bld: 240 mg/dL — ABNORMAL HIGH (ref 70–99)
Potassium: 3.4 mmol/L — ABNORMAL LOW (ref 3.5–5.1)
Sodium: 136 mmol/L (ref 135–145)
Total Bilirubin: 0.6 mg/dL (ref 0.3–1.2)
Total Protein: 6.2 g/dL — ABNORMAL LOW (ref 6.5–8.1)

## 2022-10-07 LAB — TROPONIN I (HIGH SENSITIVITY)
Troponin I (High Sensitivity): 99 ng/L — ABNORMAL HIGH (ref <18)
Troponin I (High Sensitivity): 96 ng/L — ABNORMAL HIGH (ref <18)

## 2022-10-07 LAB — SAMPLE TO BLOOD BANK

## 2022-10-07 MED ORDER — LIDOCAINE-PRILOCAINE 2.5-2.5 % EX CREA: 1.0000 | TOPICAL_CREAM | CUTANEOUS | Status: DC | PRN

## 2022-10-07 MED ORDER — IPRATROPIUM BROMIDE 0.02 % IN SOLN: 0.5000 mg | Freq: Four times a day (QID) | RESPIRATORY_TRACT | Status: DC | PRN

## 2022-10-07 MED ORDER — HYDROCODONE-ACETAMINOPHEN 5-325 MG PO TABS: 1.0000 | ORAL_TABLET | ORAL | Status: DC | PRN

## 2022-10-07 MED ORDER — MORPHINE SULFATE (PF) 2 MG/ML IV SOLN: 1.0000 mg | Freq: Four times a day (QID) | INTRAVENOUS | Status: DC | PRN

## 2022-10-07 MED ORDER — PANTOPRAZOLE SODIUM 40 MG IV SOLR
40.0000 mg | Freq: Two times a day (BID) | INTRAVENOUS | Status: DC
  Administered 2022-10-07 – 2022-10-09 (×4): 40 mg via INTRAVENOUS
  Filled 2022-10-07 (×4): qty 10

## 2022-10-07 MED ORDER — HYDRALAZINE HCL 20 MG/ML IJ SOLN: 5.0000 mg | Freq: Three times a day (TID) | INTRAMUSCULAR | Status: DC | PRN

## 2022-10-07 MED ORDER — ACETAMINOPHEN 650 MG RE SUPP: 650.0000 mg | Freq: Four times a day (QID) | RECTAL | Status: DC | PRN

## 2022-10-07 MED ORDER — METOPROLOL TARTRATE 12.5 MG HALF TABLET
12.5000 mg | ORAL_TABLET | Freq: Two times a day (BID) | ORAL | Status: DC
  Administered 2022-10-07 – 2022-10-09 (×4): 12.5 mg via ORAL
  Filled 2022-10-07 (×4): qty 1

## 2022-10-07 MED ORDER — ONDANSETRON HCL 4 MG PO TABS
4.0000 mg | ORAL_TABLET | Freq: Four times a day (QID) | ORAL | Status: DC | PRN
  Administered 2022-10-09: 4 mg via ORAL
  Filled 2022-10-07: qty 1

## 2022-10-07 MED ORDER — ACETAMINOPHEN 325 MG PO TABS
650.0000 mg | ORAL_TABLET | Freq: Four times a day (QID) | ORAL | Status: DC | PRN
  Administered 2022-10-08 – 2022-10-09 (×2): 650 mg via ORAL
  Filled 2022-10-07 (×2): qty 2

## 2022-10-07 MED ORDER — IOHEXOL 350 MG/ML SOLN
75.0000 mL | Freq: Once | INTRAVENOUS | Status: AC | PRN
  Administered 2022-10-07: 75 mL via INTRAVENOUS

## 2022-10-07 MED ORDER — SODIUM CHLORIDE 0.9% IV SOLUTION: Freq: Once | INTRAVENOUS | Status: AC

## 2022-10-07 MED ORDER — ALBUTEROL SULFATE (2.5 MG/3ML) 0.083% IN NEBU: 2.5000 mg | INHALATION_SOLUTION | Freq: Four times a day (QID) | RESPIRATORY_TRACT | Status: DC | PRN

## 2022-10-07 MED ORDER — ONDANSETRON HCL 4 MG/2ML IJ SOLN: 4.0000 mg | Freq: Four times a day (QID) | INTRAMUSCULAR | Status: DC | PRN

## 2022-10-07 NOTE — ED Triage Notes (Signed)
Pt reports blood in stool, shortness of breath, dizziness, and weakness. Pt stating she went to her Dr. For blood work and was told her Hgb was low.

## 2022-10-07 NOTE — ED Provider Notes (Signed)
[place] [facility]PARTMENT AT [facility] Provider Note   CSN: [id number] Arrival date & time: 10/07/22  1538    History  Chief Complaint  Patient presents with   GI Bleeding    Sheena [name] is a 77 y.o. female hx of small cell lung cancer here for evaluation of weakness and shortness of breath over the last 4 days.  No overt chest pain however has shortness of breath, worse with exertion. Tightness across chest, however typically feels this after her radiation. Feels generally weak.  Had bowel movement on Sunday and noted some bright red blood in the toilet, none since.  Has some dizziness at rest but is primarily worse with movement.  No PND orthopnea.  No pain or swelling to lower extremities.  Had radiation to chest Thursday, last chemotherapy 7/26.  No back pain.  Has noticed a petechial rash to her bilateral upper extremities symptoms feel similar to when she need to be admitted previously for blood transfusion    HPI     Home Medications Prior to Admission medications   Medication Sig Start Date End Date Taking? Authorizing Provider  acetaminophen (TYLENOL) 500 MG tablet Take 1,000 mg by mouth 2 (two) times daily as needed for mild pain.   Yes [provider]  amiodarone (PACERONE) 200 MG tablet Take 1 tablet (200 mg total) by mouth 2 (two) times daily for 5 days, THEN 1 tablet (200 mg total) daily. Follow up with cardiology outpatient for refills. Patient taking differently: Take 1 tablet by mouth daily 09/14/22 10/19/22 Yes Zigmund Daniel., MD  atorvastatin (LIPITOR) 20 MG tablet TAKE 1 TABLET BY MOUTH EVERY DAY Patient taking differently: Take 20 mg by mouth daily. 05/25/22  Yes [name], MD  Calcium Citrate-Vitamin D (CALCIUM CITRATE PETITE/VIT D PO) Take 1 tablet by mouth 2 (two) times daily.   Yes [provider]  famotidine (PEPCID) 20 MG tablet Take 20 mg by mouth daily.   Yes [provider]  lidocaine-prilocaine  (EMLA) cream Apply 1 Application topically as needed. Patient taking differently: Apply 1 Application topically as needed (port access). 08/04/22  Yes Heilingoetter, Cassandra L, PA-C  magnesium oxide (MAG-OX) 400 (240 Mg) MG tablet Take 1 tablet (400 mg total) by mouth 2 (two) times daily. Follow your magnesium level outpatient with your PCP 09/14/22 10/14/22 Yes Zigmund Daniel., MD  metoprolol tartrate (LOPRESSOR) 25 MG tablet Take 0.5 tablets (12.5 mg total) by mouth 2 (two) times daily. 09/14/22 10/14/22 Yes Zigmund Daniel., MD  PRESCRIPTION MEDICATION Apply 1 application  topically in the morning and at bedtime. Unknown cream given to Pt from the cancer center for after radiation   Yes [provider]  prochlorperazine (COMPAZINE) 10 MG tablet Take 1 tablet (10 mg total) by mouth every 6 (six) hours as needed for nausea or vomiting. 08/04/22  Yes Heilingoetter, Cassandra L, PA-C  sucralfate (CARAFATE) 1 GM/10ML suspension Take 10 mLs (1 g total) by mouth 4 (four) times daily -  with meals and at bedtime. Patient taking differently: Take 1 g by mouth 4 (four) times daily. 08/26/22  Yes Ronny Bacon, PA-C  traMADol (ULTRAM) 50 MG tablet Take 0.5-1 tablets (25-50 mg total) by mouth 2 (two) times daily as needed. For chronic low back pain. May refill monthly Patient taking differently: Take 25 mg by mouth daily as needed for moderate pain or severe pain. 05/07/22  Yes [name], MD  amLODipine (NORVASC)  5 MG tablet Take 5 mg by mouth daily. Patient not taking: Reported on 10/07/2022 10/06/22   [provider]  cyclobenzaprine (FLEXERIL) 5 MG tablet TAKE 1 TABLET BY MOUTH TWICE A DAY AS NEEDED FOR MUSCLE SPASM Patient not taking: Reported on 09/15/2022 07/13/22   [name], MD  potassium chloride (KLOR-CON M) 10 MEQ tablet Take 2 tablets (20 mEq total) by mouth daily for 14 days. Follow repeat labs with your PCP within 1 week Patient not taking: Reported on  10/07/2022 09/14/22 09/28/22  Zigmund Daniel., MD      Allergies    Antihistamines, diphenhydramine-type; Aspirin; and Evista [raloxifene]    Review of Systems   Review of Systems  Constitutional: Negative.   HENT: Negative.    Respiratory:  Positive for shortness of breath.   Cardiovascular: Negative.   Gastrointestinal: Negative.   Genitourinary: Negative.   Musculoskeletal: Negative.   Skin:  Positive for rash.  Neurological:  Positive for weakness and light-headedness. Negative for dizziness, tremors, seizures, syncope, facial asymmetry, speech difficulty, numbness and headaches.  All other systems reviewed and are negative.   Physical Exam Updated Vital Signs BP (!) 149/51   Pulse 74   Temp 97.9 F (36.6 C) (Oral)   Resp 18   SpO2 98%  Physical Exam Vitals and nursing note reviewed.  Constitutional:      General: She is not in acute distress.    Appearance: She is well-developed. She is ill-appearing. She is not toxic-appearing or diaphoretic.  HENT:     Head: Normocephalic and atraumatic.  Eyes:     Pupils: Pupils are equal, round, and reactive to light.     Comments: Pale conjunctiva  Cardiovascular:     Rate and Rhythm: Normal rate.     Pulses:          Radial pulses are 2+ on the right side and 2+ on the left side.       Dorsalis pedis pulses are 2+ on the right side and 2+ on the left side.     Heart sounds: Normal heart sounds.  Pulmonary:     Effort: No respiratory distress.     Breath sounds: Normal breath sounds and air entry.     Comments: Clear bilaterally, speaks in full sentences Chest:     Comments: Port right upper chest wall no surrounding erythema or warmth Abdominal:     General: Bowel sounds are normal. There is no distension.     Tenderness: There is no abdominal tenderness. There is no right CVA tenderness, left CVA tenderness or guarding. Negative signs include Murphy's sign and McBurney's sign.     Comments: Abdomen soft, nontender   Genitourinary:    Comments: Declined rectal exam Musculoskeletal:        General: Normal range of motion.     Cervical back: Normal range of motion.     Comments: No bony tenderness, compartments soft  Skin:    General: Skin is warm and dry.     Comments: Petechial rash bilateral forearms no purpura  Neurological:     General: No focal deficit present.     Mental Status: She is alert.  Psychiatric:        Mood and Affect: Mood normal.    ED Results / Procedures / Treatments   Labs (all labs ordered are listed, but only abnormal results are displayed) Labs Reviewed  CBC WITH DIFFERENTIAL/PLATELET - Abnormal; Notable for the following components:  Result Value   WBC 18.4 (*)    RBC 1.86 (*)    Hemoglobin 6.0 (*)    HCT 18.5 (*)    RDW 16.9 (*)    Platelets 8 (*)    Neutro Abs 15.5 (*)    Lymphs Abs 0.6 (*)    Monocytes Absolute 1.6 (*)    Abs Immature Granulocytes 0.55 (*)    All other components within normal limits  COMPREHENSIVE METABOLIC PANEL - Abnormal; Notable for the following components:   Potassium 3.4 (*)    Glucose, Bld 240 (*)    Creatinine, Ser 1.20 (*)    Total Protein 6.2 (*)    Albumin 3.3 (*)    GFR, Estimated 47 (*)    All other components within normal limits  TROPONIN I (HIGH SENSITIVITY) - Abnormal; Notable for the following components:   Troponin I (High Sensitivity) 99 (*)    All other components within normal limits  TROPONIN I (HIGH SENSITIVITY) - Abnormal; Notable for the following components:   Troponin I (High Sensitivity) 96 (*)    All other components within normal limits  URINALYSIS, W/ REFLEX TO CULTURE (INFECTION SUSPECTED)  POC OCCULT BLOOD, ED  TYPE AND SCREEN  PREPARE RBC (CROSSMATCH)  PREPARE PLATELET PHERESIS    EKG None  Radiology CT Angio Chest PE W and/or Wo Contrast  Result Date: 10/07/2022 CLINICAL DATA:  Concern for pulmonary embolism.  Short of breath. EXAM: CT ANGIOGRAPHY CHEST WITH CONTRAST TECHNIQUE:  Multidetector CT imaging of the chest was performed using the standard protocol during bolus administration of intravenous contrast. Multiplanar CT image reconstructions and MIPs were obtained to evaluate the vascular anatomy. RADIATION DOSE REDUCTION: This exam was performed according to the departmental dose-optimization program which includes automated exposure control, adjustment of the mA and/or kV according to patient size and/or use of iterative reconstruction technique. CONTRAST:  75mL OMNIPAQUE IOHEXOL 350 MG/ML SOLN COMPARISON:  None Available. FINDINGS: Cardiovascular: No filling defects within the pulmonary arteries to suggest acute pulmonary embolism. Coronary artery calcification and aortic atherosclerotic calcification. Mediastinum/Nodes: No axillary or supraclavicular adenopathy. No mediastinal or hilar adenopathy. No pericardial fluid. Esophagus normal. Lungs/Pleura: No pulmonary infarction. No pneumonia. No pleural fluid. No pneumothorax Upper lobe centrilobular emphysema. Upper Abdomen: Limited view of the liver, kidneys, pancreas are unremarkable. Normal adrenal glands. Musculoskeletal: No aggressive osseous lesion. Compression deformity in lower thoracic spine is chronic. Augmentation lower thoracic spine. Chronic compression fracture in the upper thoracic spine additionally. No change from 08/20/2022. Review of the MIP images confirms the above findings. IMPRESSION: 1. No evidence acute pulmonary embolism. 2. No acute pulmonary findings. 3. Aortic Atherosclerosis (ICD10-I70.0) and Emphysema (ICD10-J43.9). Electronically Signed   By: Genevive Bi M.D.   On: 10/07/2022 18:42   DG Chest Port 1 View  Result Date: 10/07/2022 CLINICAL DATA:  Shortness of breath. EXAM: PORTABLE CHEST 1 VIEW COMPARISON:  Chest radiograph 09/07/2022. FINDINGS: Rotated patient. Right chest Port-A-Cath tip projects over the low SVC. No consolidation or pulmonary edema. No pleural effusion or pneumothorax. Prior T12  kyphoplasty. IMPRESSION: No evidence of acute cardiopulmonary disease. Electronically Signed   By: Orvan Falconer M.D.   On: 10/07/2022 17:13    Procedures .Critical Care  Performed by: Linwood Dibbles, PA-C Authorized by: Linwood Dibbles, PA-C   Critical care provider statement:    Critical care time (minutes):  35   Critical care was time spent personally by me on the following activities:  Development of treatment plan with patient or  surrogate, discussions with consultants, evaluation of patient's response to treatment, examination of patient, ordering and review of laboratory studies, ordering and review of radiographic studies, ordering and performing treatments and interventions, pulse oximetry, re-evaluation of patient's condition and review of old charts     Medications Ordered in ED Medications  0.9 %  sodium chloride infusion (Manually program via Guardrails IV Fluids) (0 mLs Intravenous Stopped 10/07/22 1935)  iohexol (OMNIPAQUE) 350 MG/ML injection 75 mL (75 mLs Intravenous Contrast Given 10/07/22 1828)   ED Course/ Medical Decision Making/ A&P   77 year old history of cancer currently undergoing treatment here for evaluation of shortness of breath and weakness.  She is afebrile, nonseptic appearing however does appear very pale, chronically ill.  Tachycardic on arrival, normal blood pressures.  Admits to blood in stool on Sunday, none since.  She is unsure if this is bright red or dark red.  No chest pain or short of breath, worse with exertion.  Had abnormal labs in outpatient setting, subsequently sent here.  She has known history of thrombocytopenia and anemia requiring transfusions previously.  Labs and imaging personally viewed and interpreted:  CBC leukocytosis 18.4, hemoglobin 6, platelets 8 Metabolic panel creatinine 1.2 up from baseline of 0.8 Troponin 99, 96 suspect likely due to her severe anemia, she denies any current chest pain Chest x-ray without pulm edema,  infiltrates, cardiomegaly CTA without acute process  Patient given packed red blood cells as well as platelets for her severe anemia and thrombocytopenia.  She declines rectal exam.  Plan on admission which patient is agreeable with  Discussed with Dr. Emmit Pomfret with hospitalist agrees to evaluate patient for admission  The patient appears reasonably stabilized for admission considering the current resources, flow, and capabilities available in the ED at this time, and I doubt any other [facility] requiring further screening and/or treatment in the ED prior to admission.                                 Medical Decision Making Amount and/or Complexity of Data Reviewed Independent Historian: spouse External Data Reviewed: labs, radiology, ECG and notes. Labs: ordered. Decision-making details documented in ED Course. Radiology: ordered and independent interpretation performed. Decision-making details documented in ED Course. ECG/medicine tests: ordered and independent interpretation performed. Decision-making details documented in ED Course.  Risk OTC drugs. Prescription drug management. Parenteral controlled substances. Decision regarding hospitalization. Diagnosis or treatment significantly limited by social determinants of health.          Final Clinical Impression(s) / ED Diagnoses Final diagnoses:  Symptomatic anemia  Thrombocytopenia (HCC)  AKI (acute kidney injury) (HCC)  Elevated troponin    Rx / DC Orders ED Discharge Orders     None         ,  A, PA-C 10/07/22 2243    Arby Barrette, MD 10/13/22 1503

## 2022-10-07 NOTE — Telephone Encounter (Signed)
This nurse reached out to patients son.  Advised that we are attempting to reach his mother due to her having a low platelet count.  The son stated he will attempt to reach her or her husband and have them give the clinic a call.  No further questions or concerns noted.

## 2022-10-07 NOTE — Telephone Encounter (Signed)
This nurse spoke with patient and her husband.  Patient states that she is very tired, having shortness of breath and she has noticed bleeding in her urine and stools.  This nurse advised that her Platelet count is low and she will need to go to ED for evaluation and treatment.  They acknowledged understanding and stated they will go when they are done eating.  No further questions or concerns noted at this time.

## 2022-10-07 NOTE — ED Notes (Signed)
Pt attempted to urinate with bedside commode and only urinated a few drops, not enough to collect UA sample. Pt will try again when urge is present.

## 2022-10-07 NOTE — ED Notes (Signed)
Pt made aware regarding need for UA sample

## 2022-10-07 NOTE — ED Notes (Signed)
Patient transported to CT 

## 2022-10-07 NOTE — H&P (Incomplete)
History and Physical   [facility] Admission History and Physical [employer], D.O.    Patient Name: Sheena [name] MR#: [id number] Date of Birth: [birth year] Date of Admission: 10/07/2022  Referring MD/NP/PA: Ralph Leyden Primary Care Physician: [name], MD  Chief Complaint:  Chief Complaint  Patient presents with   GI Bleeding    HPI: Sheena [name] is a 77 y.o. female with a known history of anxiety, anemia, HTN SCLC last chemotherapy 7/26 and last radiation 8/1 chronic thrombocytopenia presents to the emergency department for evaluation of weakness and shortness of breath.  Patient was in a usual state of health until 4 days ago when she describes the onset of tightness associated with shortness of breath as well as dizziness.  All of her symptoms occurred while at rest but were worsened with movement..  Of note she also reported thumb bright red blood per rectum 3 days ago which has resolved.  She states that her symptoms are similar to previous episodes of anemia requiring blood transfusion  Otherwise there has been no change in status. Patient has been taking medication as prescribed and there has been no recent change in medication or diet.  No recent antibiotics.  There has been no recent illness, hospitalizations, travel or sick contacts.    EMS/ED Course: Patient received normal sodium and order for transfusion of red blood cells and platelets. Medical admission has been requested for further management of normocytic anemia, acute on chronic thrombocytopenia, mild hypokalemia, AKI  Review of Systems:  CONSTITUTIONAL: Positive fatigue and weakness.  No fever/chills, weight gain/loss, headache. EYES: No blurry or double vision. ENT: No tinnitus, postnasal drip, redness or soreness of the oropharynx. RESPIRATORY: Positive dyspnea on exertion no cough,  wheeze.  No hemoptysis.  CARDIOVASCULAR: Positive chest pain, negative palpitations,  syncope, orthopnea. No lower extremity edema.  GASTROINTESTINAL: Positive for hematochezia.  No nausea, vomiting, abdominal pain, diarrhea, constipation.  No hematemesis, melena. GENITOURINARY: No dysuria, frequency, hematuria. ENDOCRINE: No polyuria or nocturia. No heat or cold intolerance. HEMATOLOGY: No anemia, bruising, bleeding. INTEGUMENTARY: No rashes, ulcers, lesions. MUSCULOSKELETAL: No arthritis, gout. NEUROLOGIC: No numbness, tingling, ataxia, seizure-type activity. PSYCHIATRIC: No anxiety, depression, insomnia.   Past Medical History:  Diagnosis Date   Anxiety    Heart murmur    Hypertension    Scarlet fever with other complications childhood   "had to learn to work again" has had leg weakness    Past Surgical History:  Procedure Laterality Date   BRONCHIAL NEEDLE ASPIRATION BIOPSY  06/30/2022   Procedure: BRONCHIAL NEEDLE ASPIRATION BIOPSIES;  Surgeon: [name], DO;  Location: [facility];  Service: Pulmonary;;   IR IMAGING GUIDED PORT INSERTION  07/31/2022   KYPHOPLASTY     2014   peridontal surgery     VIDEO BRONCHOSCOPY WITH ENDOBRONCHIAL ULTRASOUND N/A 06/30/2022   Procedure: VIDEO BRONCHOSCOPY WITH ENDOBRONCHIAL ULTRASOUND;  Surgeon: [name], DO;  Location: [facility];  Service: Pulmonary;  Laterality: N/A;     reports that she quit smoking about 5 years ago. Her smoking use included cigarettes. She started smoking about 50 years ago. She has a 45 pack-year smoking history. She has never used smokeless tobacco. She reports that she does not drink alcohol and does not use drugs.  Allergies  Allergen Reactions   Antihistamines, Diphenhydramine-Type Other (See Comments)    palpitations, feel jittery   Aspirin Other (See Comments)    Stomach cramps   Evista [Raloxifene] Other (See Comments)  Hot Flashes     Family History  Problem Relation Age of Onset   Cancer Mother        breast, spine mets   Hypertension Mother    Heart disease  Mother 83       CABG 4 vessel   COPD Father        emphysema   Heart disease Brother 73       MI    Prior to Admission medications   Medication Sig Start Date End Date Taking? Authorizing Provider  acetaminophen (TYLENOL) 500 MG tablet Take 1,000 mg by mouth 2 (two) times daily as needed for mild pain.   Yes [provider]  amiodarone (PACERONE) 200 MG tablet Take 1 tablet (200 mg total) by mouth 2 (two) times daily for 5 days, THEN 1 tablet (200 mg total) daily. Follow up with cardiology outpatient for refills. Patient taking differently: Take 1 tablet by mouth daily 09/14/22 10/19/22 Yes Zigmund Daniel., MD  atorvastatin (LIPITOR) 20 MG tablet TAKE 1 TABLET BY MOUTH EVERY DAY Patient taking differently: Take 20 mg by mouth daily. 05/25/22  Yes [name], MD  Calcium Citrate-Vitamin D (CALCIUM CITRATE PETITE/VIT D PO) Take 1 tablet by mouth 2 (two) times daily.   Yes [provider]  famotidine (PEPCID) 20 MG tablet Take 20 mg by mouth daily.   Yes [provider]  lidocaine-prilocaine (EMLA) cream Apply 1 Application topically as needed. Patient taking differently: Apply 1 Application topically as needed (port access). 08/04/22  Yes Heilingoetter, Cassandra L, PA-C  magnesium oxide (MAG-OX) 400 (240 Mg) MG tablet Take 1 tablet (400 mg total) by mouth 2 (two) times daily. Follow your magnesium level outpatient with your PCP 09/14/22 10/14/22 Yes Zigmund Daniel., MD  metoprolol tartrate (LOPRESSOR) 25 MG tablet Take 0.5 tablets (12.5 mg total) by mouth 2 (two) times daily. 09/14/22 10/14/22 Yes Zigmund Daniel., MD  PRESCRIPTION MEDICATION Apply 1 application  topically in the morning and at bedtime. Unknown cream given to Pt from the cancer center for after radiation   Yes [provider]  prochlorperazine (COMPAZINE) 10 MG tablet Take 1 tablet (10 mg total) by mouth every 6 (six) hours as needed for nausea or vomiting. 08/04/22  Yes  Heilingoetter, Cassandra L, PA-C  sucralfate (CARAFATE) 1 GM/10ML suspension Take 10 mLs (1 g total) by mouth 4 (four) times daily -  with meals and at bedtime. Patient taking differently: Take 1 g by mouth 4 (four) times daily. 08/26/22  Yes Ronny Bacon, PA-C  traMADol (ULTRAM) 50 MG tablet Take 0.5-1 tablets (25-50 mg total) by mouth 2 (two) times daily as needed. For chronic low back pain. May refill monthly Patient taking differently: Take 25 mg by mouth daily as needed for moderate pain or severe pain. 05/07/22  Yes [name], MD  amLODipine (NORVASC) 5 MG tablet Take 5 mg by mouth daily. Patient not taking: Reported on 10/07/2022 10/06/22   [provider]  cyclobenzaprine (FLEXERIL) 5 MG tablet TAKE 1 TABLET BY MOUTH TWICE A DAY AS NEEDED FOR MUSCLE SPASM Patient not taking: Reported on 09/15/2022 07/13/22   [name], MD  potassium chloride (KLOR-CON M) 10 MEQ tablet Take 2 tablets (20 mEq total) by mouth daily for 14 days. Follow repeat labs with your PCP within 1 week Patient not taking: Reported on 10/07/2022 09/14/22 09/28/22  Zigmund Daniel., MD    Physical Exam: Vitals:  10/07/22 2000 10/07/22 2113 10/07/22 2115 10/07/22 2130  BP: (!) 155/56 (!) 177/67 (!) 177/67 (!) 173/60  Pulse: 85 96 97 92  Resp: (!) 21 20 20 20   Temp:  98.7 F (37.1 C) 98.7 F (37.1 C) 97.9 F (36.6 C)  TempSrc:   Oral Oral  SpO2: 97% 98% 98% 97%    GENERAL: 77 y.o.-year-old white female patient, well-developed, well-nourished lying in the bed in no acute distress.  Pleasant and cooperative.   HEENT: Head atraumatic, normocephalic.  Pale conjunctiva pupils equal. Mucus membranes moist. NECK: Supple. No JVD. CHEST: Normal breath sounds bilaterally. No wheezing, rales, rhonchi or crackles. No use of accessory muscles of respiration.  No reproducible chest wall tenderness.  CARDIOVASCULAR: S1, S2 normal. No murmurs, rubs, or gallops. Cap refill <2 seconds. Pulses intact  distally.  ABDOMEN: Soft, nondistended, nontender. No rebound, guarding, rigidity. Normoactive bowel sounds present in all four quadrants.  EXTREMITIES: No pedal edema, cyanosis, or clubbing. No calf tenderness or Homan's sign.  NEUROLOGIC: The patient is alert and oriented x 3. Cranial nerves II through XII are grossly intact with no focal sensorimotor deficit. PSYCHIATRIC:  Normal affect, mood, thought content. SKIN: Warm, dry, and intact without obvious rash, lesion, or ulcer.    Labs on Admission:  CBC: Recent Labs  Lab 10/07/22 1322 10/07/22 1639  WBC 14.1* 18.4*  NEUTROABS 11.7* 15.5*  HGB 6.4* 6.0*  HCT 18.5* 18.5*  MCV 96.9 99.5  PLT 7* 8*   Basic Metabolic Panel: Recent Labs  Lab 10/07/22 1322 10/07/22 1639  NA 137 136  K 3.4* 3.4*  CL 98 98  CO2 30 27  GLUCOSE 137* 240*  BUN 15 17  CREATININE 0.98 1.20*  CALCIUM 9.1 9.2   GFR: CrCl cannot be calculated (Unknown ideal weight.). Liver Function Tests: Recent Labs  Lab 10/07/22 1322 10/07/22 1639  AST 11* 19  ALT 8 12  ALKPHOS 103 105  BILITOT 0.4 0.6  PROT 6.1* 6.2*  ALBUMIN 3.5 3.3*   No results for input(s): "LIPASE", "AMYLASE" in the last 168 hours. No results for input(s): "AMMONIA" in the last 168 hours. Coagulation Profile: No results for input(s): "INR", "PROTIME" in the last 168 hours. Cardiac Enzymes: No results for input(s): "CKTOTAL", "CKMB", "CKMBINDEX", "TROPONINI" in the last 168 hours. BNP (last 3 results) No results for input(s): "PROBNP" in the last 8760 hours. HbA1C: No results for input(s): "HGBA1C" in the last 72 hours. CBG: No results for input(s): "GLUCAP" in the last 168 hours. Lipid Profile: No results for input(s): "CHOL", "HDL", "LDLCALC", "TRIG", "CHOLHDL", "LDLDIRECT" in the last 72 hours. Thyroid Function Tests: No results for input(s): "TSH", "T4TOTAL", "FREET4", "T3FREE", "THYROIDAB" in the last 72 hours. Anemia Panel: No results for input(s): "VITAMINB12",  "FOLATE", "FERRITIN", "TIBC", "IRON", "RETICCTPCT" in the last 72 hours. Urine analysis:    Component Value Date/Time   COLORURINE YELLOW 09/07/2022 1400   APPEARANCEUR CLEAR 09/07/2022 1400   LABSPEC 1.011 09/07/2022 1400   PHURINE 5.0 09/07/2022 1400   GLUCOSEU NEGATIVE 09/07/2022 1400   HGBUR MODERATE (A) 09/07/2022 1400   BILIRUBINUR NEGATIVE 09/07/2022 1400   BILIRUBINUR neg 03/06/2021 1200   KETONESUR NEGATIVE 09/07/2022 1400   PROTEINUR NEGATIVE 09/07/2022 1400   UROBILINOGEN 0.2 03/06/2021 1200   UROBILINOGEN 0.2 10/13/2010 1536   NITRITE NEGATIVE 09/07/2022 1400   LEUKOCYTESUR TRACE (A) 09/07/2022 1400   Sepsis Labs: @LABRCNTIP (procalcitonin:4,lacticidven:4) )No results found for this or any previous visit (from the past 240 hour(s)).   Radiological Exams on  Admission: CT Angio Chest PE W and/or Wo Contrast  Result Date: 10/07/2022 CLINICAL DATA:  Concern for pulmonary embolism.  Short of breath. EXAM: CT ANGIOGRAPHY CHEST WITH CONTRAST TECHNIQUE: Multidetector CT imaging of the chest was performed using the standard protocol during bolus administration of intravenous contrast. Multiplanar CT image reconstructions and MIPs were obtained to evaluate the vascular anatomy. RADIATION DOSE REDUCTION: This exam was performed according to the departmental dose-optimization program which includes automated exposure control, adjustment of the mA and/or kV according to patient size and/or use of iterative reconstruction technique. CONTRAST:  75mL OMNIPAQUE IOHEXOL 350 MG/ML SOLN COMPARISON:  None Available. FINDINGS: Cardiovascular: No filling defects within the pulmonary arteries to suggest acute pulmonary embolism. Coronary artery calcification and aortic atherosclerotic calcification. Mediastinum/Nodes: No axillary or supraclavicular adenopathy. No mediastinal or hilar adenopathy. No pericardial fluid. Esophagus normal. Lungs/Pleura: No pulmonary infarction. No pneumonia. No pleural fluid.  No pneumothorax Upper lobe centrilobular emphysema. Upper Abdomen: Limited view of the liver, kidneys, pancreas are unremarkable. Normal adrenal glands. Musculoskeletal: No aggressive osseous lesion. Compression deformity in lower thoracic spine is chronic. Augmentation lower thoracic spine. Chronic compression fracture in the upper thoracic spine additionally. No change from 08/20/2022. Review of the MIP images confirms the above findings. IMPRESSION: 1. No evidence acute pulmonary embolism. 2. No acute pulmonary findings. 3. Aortic Atherosclerosis (ICD10-I70.0) and Emphysema (ICD10-J43.9). Electronically Signed   By: Genevive Bi M.D.   On: 10/07/2022 18:42   DG Chest Port 1 View  Result Date: 10/07/2022 CLINICAL DATA:  Shortness of breath. EXAM: PORTABLE CHEST 1 VIEW COMPARISON:  Chest radiograph 09/07/2022. FINDINGS: Rotated patient. Right chest Port-A-Cath tip projects over the low SVC. No consolidation or pulmonary edema. No pleural effusion or pneumothorax. Prior T12 kyphoplasty. IMPRESSION: No evidence of acute cardiopulmonary disease. Electronically Signed   By: Orvan Falconer M.D.   On: 10/07/2022 17:13     EKG is pending  Assessment/Plan  This is a 77 y.o. female with a history of anxiety, anemia, HTN, HYPERLIPIDEMIA, GERD SCLC last chemotherapy 7/26 and last radiation 8/1 chronic thrombocytopenia now being admitted with:  #. Symptomatic anemia and thrombocytopenia -Admit inpatient - Monitor on telemetry and continuous pulse ox - Transfuse packed red blood cells and platelets - Trend CBC -Given reported hematochezia will keep n.p.o. pending stool guaiac - IV fluid hydration - Hematology consult for a.m. - Check iron B12 folate and stool guaiac -Fall precautions -SCDs for DVT prophylaxis  #.  Chest pain with elevated troponin likely secondary to demand ischemia - Monitor on telemetry - Trend troponins  #.  Mild hypokalemia - Replace orally  #. Acute kidney injury  - IV  fluids and repeat BMP in AM.  - Avoid nephrotoxic medications  #. History of hypertension - Continue metoprolol  #. History of hyperlipidemia - Continue atorvastatin  #. History of GERD - Continue Pepcid  Admission status: Inpatient, telemetry IV Fluids: Normal saline Diet/Nutrition: N.p.o. Consults called: Hematology DVT Px: SCDs and early ambulation. Code Status: Full Code  Disposition Plan: To home in 1-2 days  All the records are reviewed and case discussed with ED provider. Management plans discussed with the patient and/or family who express understanding and agree with plan of care.    D.O. on 10/07/2022 at 10:03 PM CC: Primary care physician; [name], MD   10/07/2022, 10:03 PM

## 2022-10-07 NOTE — Telephone Encounter (Signed)
This nurse attempted to reach patient related to her lab results, low platelet count  and provider recommendations of ED for evaluation if patient is showing symptoms and if patient has no symptoms then schedule for transfusion at the clinic.  No answer, this nurse left a message for patient to return call to the clinic.

## 2022-10-08 ENCOUNTER — Encounter (HOSPITAL_COMMUNITY): Payer: Self-pay | Admitting: Family Medicine

## 2022-10-08 DIAGNOSIS — R7989 Other specified abnormal findings of blood chemistry: Secondary | ICD-10-CM | POA: Diagnosis not present

## 2022-10-08 DIAGNOSIS — N179 Acute kidney failure, unspecified: Secondary | ICD-10-CM

## 2022-10-08 DIAGNOSIS — D649 Anemia, unspecified: Secondary | ICD-10-CM | POA: Diagnosis not present

## 2022-10-08 LAB — CBC
HCT: 31.9 % — ABNORMAL LOW (ref 36.0–46.0)
HCT: 32.6 % — ABNORMAL LOW (ref 36.0–46.0)
Hemoglobin: 11.1 g/dL — ABNORMAL LOW (ref 12.0–15.0)
Hemoglobin: 11.9 g/dL — ABNORMAL LOW (ref 12.0–15.0)
MCH: 30.7 pg (ref 26.0–34.0)
MCH: 32 pg (ref 26.0–34.0)
MCHC: 34.8 g/dL (ref 30.0–36.0)
MCHC: 36.5 g/dL — ABNORMAL HIGH (ref 30.0–36.0)
MCV: 88.4 fL (ref 80.0–100.0)
MCV: 87.6 fL (ref 80.0–100.0)
Platelets: 27 10*3/uL — CL (ref 150–400)
Platelets: 20 10*3/uL — CL (ref 150–400)
RBC: 3.61 MIL/uL — ABNORMAL LOW (ref 3.87–5.11)
RBC: 3.72 MIL/uL — ABNORMAL LOW (ref 3.87–5.11)
RDW: 16.1 % — ABNORMAL HIGH (ref 11.5–15.5)
RDW: 16.2 % — ABNORMAL HIGH (ref 11.5–15.5)
WBC: 18.3 10*3/uL — ABNORMAL HIGH (ref 4.0–10.5)
WBC: 20.8 10*3/uL — ABNORMAL HIGH (ref 4.0–10.5)
nRBC: 0 % (ref 0.0–0.2)
nRBC: 0 % (ref 0.0–0.2)

## 2022-10-08 LAB — IRON AND TIBC
Iron: 245 ug/dL — ABNORMAL HIGH (ref 28–170)
Saturation Ratios: 92 % — ABNORMAL HIGH (ref 10.4–31.8)
TIBC: 266 ug/dL (ref 250–450)
UIBC: 21 ug/dL

## 2022-10-08 LAB — LIPID PANEL
Cholesterol: 109 mg/dL (ref 0–200)
HDL: 37 mg/dL — ABNORMAL LOW (ref >40)
LDL Cholesterol: 47 mg/dL (ref 0–99)
Total CHOL/HDL Ratio: 2.9 RATIO
Triglycerides: 123 mg/dL (ref <150)
VLDL: 25 mg/dL (ref 0–40)

## 2022-10-08 LAB — COMPREHENSIVE METABOLIC PANEL
ALT: 10 U/L (ref 0–44)
AST: 17 U/L (ref 15–41)
Albumin: 3.4 g/dL — ABNORMAL LOW (ref 3.5–5.0)
Alkaline Phosphatase: 109 U/L (ref 38–126)
Anion gap: 11 (ref 5–15)
BUN: 12 mg/dL (ref 8–23)
CO2: 28 mmol/L (ref 22–32)
Calcium: 8.8 mg/dL — ABNORMAL LOW (ref 8.9–10.3)
Chloride: 97 mmol/L — ABNORMAL LOW (ref 98–111)
Creatinine, Ser: 0.95 mg/dL (ref 0.44–1.00)
GFR, Estimated: >60 mL/min (ref >60)
Glucose, Bld: 104 mg/dL — ABNORMAL HIGH (ref 70–99)
Potassium: 2.9 mmol/L — ABNORMAL LOW (ref 3.5–5.1)
Sodium: 136 mmol/L (ref 135–145)
Total Bilirubin: 0.9 mg/dL (ref 0.3–1.2)
Total Protein: 6.2 g/dL — ABNORMAL LOW (ref 6.5–8.1)

## 2022-10-08 LAB — TROPONIN I (HIGH SENSITIVITY)
Troponin I (High Sensitivity): 152 ng/L (ref <18)
Troponin I (High Sensitivity): 152 ng/L (ref <18)
Troponin I (High Sensitivity): 213 ng/L (ref <18)

## 2022-10-08 LAB — FOLATE: Folate: 5.2 ng/mL — ABNORMAL LOW (ref >5.9)

## 2022-10-08 LAB — MAGNESIUM: Magnesium: 1.5 mg/dL — ABNORMAL LOW (ref 1.7–2.4)

## 2022-10-08 LAB — TSH: TSH: 3.375 u[IU]/mL (ref 0.350–4.500)

## 2022-10-08 LAB — VITAMIN B12: Vitamin B-12: 835 pg/mL (ref 180–914)

## 2022-10-08 MED ORDER — CHLORHEXIDINE GLUCONATE CLOTH 2 % EX PADS
6.0000 | MEDICATED_PAD | Freq: Every day | CUTANEOUS | Status: DC
  Administered 2022-10-08 – 2022-10-09 (×2): 6 via TOPICAL

## 2022-10-08 MED ORDER — TRAZODONE HCL 50 MG PO TABS
25.0000 mg | ORAL_TABLET | Freq: Every evening | ORAL | Status: DC | PRN
  Administered 2022-10-08: 25 mg via ORAL
  Filled 2022-10-08: qty 1

## 2022-10-08 MED ORDER — SODIUM CHLORIDE 0.9 % IV SOLN: INTRAVENOUS | Status: DC

## 2022-10-08 MED ORDER — POTASSIUM CHLORIDE 10 MEQ/100ML IV SOLN
10.0000 meq | INTRAVENOUS | Status: AC
  Administered 2022-10-08 (×2): 10 meq via INTRAVENOUS
  Filled 2022-10-08 (×3): qty 100

## 2022-10-08 MED ORDER — BISACODYL 5 MG PO TBEC: 5.0000 mg | DELAYED_RELEASE_TABLET | Freq: Every day | ORAL | Status: DC | PRN

## 2022-10-08 MED ORDER — ENSURE ENLIVE PO LIQD
237.0000 mL | Freq: Two times a day (BID) | ORAL | Status: DC
  Administered 2022-10-09 (×2): 237 mL via ORAL

## 2022-10-08 MED ORDER — SODIUM CHLORIDE 0.9% FLUSH
10.0000 mL | Freq: Two times a day (BID) | INTRAVENOUS | Status: DC
  Administered 2022-10-08 – 2022-10-09 (×2): 10 mL

## 2022-10-08 MED ORDER — ATORVASTATIN CALCIUM 10 MG PO TABS
20.0000 mg | ORAL_TABLET | Freq: Every day | ORAL | Status: DC
  Administered 2022-10-08 – 2022-10-09 (×2): 20 mg via ORAL
  Filled 2022-10-08 (×2): qty 2

## 2022-10-08 MED ORDER — SODIUM CHLORIDE 0.9% FLUSH: 10.0000 mL | INTRAVENOUS | Status: DC | PRN

## 2022-10-08 MED ORDER — TRAMADOL HCL 50 MG PO TABS: 25.0000 mg | ORAL_TABLET | Freq: Every day | ORAL | Status: DC | PRN

## 2022-10-08 MED ORDER — SODIUM CHLORIDE 0.9% FLUSH
3.0000 mL | Freq: Two times a day (BID) | INTRAVENOUS | Status: DC
  Administered 2022-10-08 – 2022-10-09 (×4): 3 mL via INTRAVENOUS

## 2022-10-08 MED ORDER — FAMOTIDINE 20 MG PO TABS
20.0000 mg | ORAL_TABLET | Freq: Every day | ORAL | Status: DC
  Administered 2022-10-08 – 2022-10-09 (×2): 20 mg via ORAL
  Filled 2022-10-08 (×2): qty 1

## 2022-10-08 MED ORDER — MAGNESIUM SULFATE 4 GM/100ML IV SOLN
4.0000 g | Freq: Once | INTRAVENOUS | Status: AC
  Administered 2022-10-08: 4 g via INTRAVENOUS
  Filled 2022-10-08: qty 100

## 2022-10-08 MED ORDER — SENNOSIDES-DOCUSATE SODIUM 8.6-50 MG PO TABS: 1.0000 | ORAL_TABLET | Freq: Every evening | ORAL | Status: DC | PRN

## 2022-10-08 MED ORDER — AMIODARONE HCL 200 MG PO TABS
200.0000 mg | ORAL_TABLET | Freq: Every day | ORAL | Status: DC
  Administered 2022-10-08 – 2022-10-09 (×2): 200 mg via ORAL
  Filled 2022-10-08 (×2): qty 1

## 2022-10-08 NOTE — ED Notes (Signed)
ED TO INPATIENT HANDOFF REPORT  Name/Age/Gender Sheena [name] 77 y.o. female  Code Status    Code Status Orders  (From admission, onward)           Start     Ordered   10/07/22 2222  Full code  Continuous       Question:  By:  Answer:  Consent: discussion documented in EHR   10/07/22 2234           Code Status History     Date Active Date Inactive Code Status Order ID Comments User Context   09/07/2022 1602 09/14/2022 2348 Full Code [id number]  Maryln Gottron, MD ED   07/31/2022 1617 08/01/2022 0513 Full Code [id number]  Gilmer Mor, DO HOV       Home/SNF/Other Home  Chief Complaint Symptomatic anemia [D64.9]  Level of Care/Admitting Diagnosis ED Disposition     ED Disposition  Admit   Condition  --   Comment  Hospital Area: [facility] [[id number]]  Level of Care: Telemetry [5]  Admit to tele based on following criteria: Monitor for Ischemic changes  May admit patient to [facility] or [name] if equivalent level of care is available:: No  Covid Evaluation: Asymptomatic - no recent exposure (last 10 days) testing not required  Diagnosis: Symptomatic anemia [4742595]  Admitting Physician: Tonye Royalty [[id number]]  Attending Physician: Janann Colonel  Certification:: I certify this patient will need inpatient services for at least 2 midnights  Estimated Length of Stay: 2          Medical History Past Medical History:  Diagnosis Date   Anxiety    Heart murmur    Hypertension    Primary small cell carcinoma of lower lobe of left lung (HCC)    Scarlet fever with other complications childhood   "had to learn to work again" has had leg weakness    Allergies Allergies  Allergen Reactions   Antihistamines, Diphenhydramine-Type Other (See Comments)    palpitations, feel jittery   Aspirin Other (See Comments)    Stomach cramps   Evista [Raloxifene] Other (See Comments)    Hot Flashes     IV  Location/Drains/Wounds Patient Lines/Drains/Airways Status     Active Line/Drains/Airways     Name Placement date Placement time Site Days   Implanted Port 07/31/22 Right Chest 07/31/22  1545  Chest  69   Peripheral IV 10/07/22 20 G Anterior;Left;Proximal Forearm 10/07/22  1652  Forearm  1            Labs/Imaging Results for orders placed or performed during the hospital encounter of 10/07/22 (from the past 48 hour(s))  CBC with Differential     Status: Abnormal   Collection Time: 10/07/22  4:39 PM  Result Value Ref Range   WBC 18.4 (H) 4.0 - 10.5 K/uL   RBC 1.86 (L) 3.87 - 5.11 MIL/uL   Hemoglobin 6.0 (LL) 12.0 - 15.0 g/dL    Comment: REPEATED TO VERIFY THIS CRITICAL RESULT HAS VERIFIED AND BEEN CALLED TO RN M LEWIS BY ALEXIS CRUICKSHANK ON 08 07 2024 AT 1743, AND HAS BEEN READ BACK.     HCT 18.5 (L) 36.0 - 46.0 %   MCV 99.5 80.0 - 100.0 fL   MCH 32.3 26.0 - 34.0 pg   MCHC 32.4 30.0 - 36.0 g/dL   RDW 33.2 (H) 95.1 - 88.4 %   Platelets 8 (LL) 150 - 400 K/uL    Comment: SPECIMEN CHECKED FOR CLOTS Immature  Platelet Fraction may be clinically indicated, consider ordering this additional test [id number] REPEATED TO VERIFY PLATELET COUNT CONFIRMED BY SMEAR    nRBC 0.0 0.0 - 0.2 %   Neutrophils Relative % 84 %   Neutro Abs 15.5 (H) 1.7 - 7.7 K/uL   Lymphocytes Relative 3 %   Lymphs Abs 0.6 (L) 0.7 - 4.0 K/uL   Monocytes Relative 9 %   Monocytes Absolute 1.6 (H) 0.1 - 1.0 K/uL   Eosinophils Relative 0 %   Eosinophils Absolute 0.0 0.0 - 0.5 K/uL   Basophils Relative 1 %   Basophils Absolute 0.1 0.0 - 0.1 K/uL   WBC Morphology DOHLE BODIES     Comment: Mild Left Shift (1-5% metas, occ myelo)   Immature Granulocytes 3 %   Abs Immature Granulocytes 0.55 (H) 0.00 - 0.07 K/uL   Polychromasia PRESENT     Comment: Performed at [facility], [address] [place], [place] [address]  Comprehensive metabolic panel     Status: Abnormal   Collection Time:  10/07/22  4:39 PM  Result Value Ref Range   Sodium 136 135 - 145 mmol/L   Potassium 3.4 (L) 3.5 - 5.1 mmol/L   Chloride 98 98 - 111 mmol/L   CO2 27 22 - 32 mmol/L   Glucose, Bld 240 (H) 70 - 99 mg/dL    Comment: Glucose reference range applies only to samples taken after fasting for at least 8 hours.   BUN 17 8 - 23 mg/dL   Creatinine, Ser 8.29 (H) 0.44 - 1.00 mg/dL   Calcium 9.2 8.9 - 56.2 mg/dL   Total Protein 6.2 (L) 6.5 - 8.1 g/dL   Albumin 3.3 (L) 3.5 - 5.0 g/dL   AST 19 15 - 41 U/L   ALT 12 0 - 44 U/L   Alkaline Phosphatase 105 38 - 126 U/L   Total Bilirubin 0.6 0.3 - 1.2 mg/dL   GFR, Estimated 47 (L) >60 mL/min    Comment: (NOTE) Calculated using the CKD-EPI Creatinine Equation (2021)    Anion gap 11 5 - 15    Comment: Performed at [facility], [address], [place], [place] [address]  Type and screen [facility]     Status: None   Collection Time: 10/07/22  4:39 PM  Result Value Ref Range   ABO/RH(D) A NEG    Antibody Screen NEG    Sample Expiration 10/10/2022,2359    Unit Number [id number]    Blood Component Type RED CELLS,LR    Unit division 00    Status of Unit ISSUED,FINAL    Transfusion Status OK TO TRANSFUSE    Crossmatch Result      Compatible Performed at [facility], [address] [place], [place] [address]    Unit Number [id number]    Blood Component Type RED CELLS,LR    Unit division 00    Status of Unit ISSUED,FINAL    Transfusion Status OK TO TRANSFUSE    Crossmatch Result Compatible   Troponin I (High Sensitivity)     Status: Abnormal   Collection Time: 10/07/22  4:39 PM  Result Value Ref Range   Troponin I (High Sensitivity) 99 (H) <18 ng/L    Comment: (NOTE) Elevated high sensitivity troponin I (hsTnI) values and significant  changes across serial measurements may suggest ACS but many other  chronic and acute conditions are known to elevate hsTnI results.  Refer to the  "Links" section for chest  pain algorithms and additional  guidance. Performed at [facility], [address] [place], [place] [address]   Prepare RBC (crossmatch)     Status: None   Collection Time: 10/07/22  5:10 PM  Result Value Ref Range   Order Confirmation      ORDER PROCESSED BY BLOOD BANK Performed at [facility], [address] [place], [place] [address]   Prepare platelet pheresis     Status: None   Collection Time: 10/07/22  5:10 PM  Result Value Ref Range   Unit Number [id number]    Blood Component Type PLTP2 PSORALEN TREATED    Unit division 00    Status of Unit ISSUED,FINAL    Transfusion Status      OK TO TRANSFUSE Performed at [facility], [address], [place], [place] [address]   Troponin I (High Sensitivity)     Status: Abnormal   Collection Time: 10/07/22  7:33 PM  Result Value Ref Range   Troponin I (High Sensitivity) 96 (H) <18 ng/L    Comment: (NOTE) Elevated high sensitivity troponin I (hsTnI) values and significant  changes across serial measurements may suggest ACS but many other  chronic and acute conditions are known to elevate hsTnI results.  Refer to the "Links" section for chest pain algorithms and additional  guidance. Performed at [facility], [address] [place], [place] [address]   Urinalysis, w/ Reflex to Culture (Infection Suspected) -Urine, Clean Catch     Status: Abnormal   Collection Time: 10/07/22 11:59 PM  Result Value Ref Range   Specimen Source URINE, CLEAN CATCH    Color, Urine YELLOW YELLOW   APPearance CLEAR CLEAR   Specific Gravity, Urine >1.046 (H) 1.005 - 1.030   pH 6.0 5.0 - 8.0   Glucose, UA NEGATIVE NEGATIVE mg/dL   Hgb urine dipstick MODERATE (A) NEGATIVE   Bilirubin Urine NEGATIVE NEGATIVE   Ketones, ur NEGATIVE NEGATIVE mg/dL   Protein, ur NEGATIVE NEGATIVE mg/dL   Nitrite NEGATIVE NEGATIVE   Leukocytes,Ua NEGATIVE NEGATIVE    RBC / HPF 11-20 0 - 5 RBC/hpf   WBC, UA 6-10 0 - 5 WBC/hpf    Comment:        Reflex urine culture not performed if WBC <=10, OR if Squamous epithelial cells >5. If Squamous epithelial cells >5 suggest recollection.    Bacteria, UA NONE SEEN NONE SEEN   Squamous Epithelial / HPF 0-5 0 - 5 /HPF   Mucus PRESENT     Comment: Performed at [facility], [address] [place], [place] [address]  Comprehensive metabolic panel     Status: Abnormal   Collection Time: 10/08/22  5:39 AM  Result Value Ref Range   Sodium 136 135 - 145 mmol/L   Potassium 2.9 (L) 3.5 - 5.1 mmol/L   Chloride 97 (L) 98 - 111 mmol/L   CO2 28 22 - 32 mmol/L   Glucose, Bld 104 (H) 70 - 99 mg/dL    Comment: Glucose reference range applies only to samples taken after fasting for at least 8 hours.   BUN 12 8 - 23 mg/dL   Creatinine, Ser 6.44 0.44 - 1.00 mg/dL   Calcium 8.8 (L) 8.9 - 10.3 mg/dL   Total Protein 6.2 (L) 6.5 - 8.1 g/dL   Albumin 3.4 (L) 3.5 - 5.0 g/dL   AST 17 15 - 41 U/L   ALT 10 0 - 44 U/L   Alkaline  Phosphatase 109 38 - 126 U/L   Total Bilirubin 0.9 0.3 - 1.2 mg/dL   GFR, Estimated >09 >81 mL/min    Comment: (NOTE) Calculated using the CKD-EPI Creatinine Equation (2021)    Anion gap 11 5 - 15    Comment: Performed at [facility], [address], [place], [place] [address]  CBC     Status: Abnormal   Collection Time: 10/08/22  5:39 AM  Result Value Ref Range   WBC 18.3 (H) 4.0 - 10.5 K/uL   RBC 3.61 (L) 3.87 - 5.11 MIL/uL   Hemoglobin 11.1 (L) 12.0 - 15.0 g/dL    Comment: REPEATED TO VERIFY POST TRANSFUSION SPECIMEN DELTA CHECK NOTED    HCT 31.9 (L) 36.0 - 46.0 %   MCV 88.4 80.0 - 100.0 fL    Comment: POST TRANSFUSION SPECIMEN REPEATED TO VERIFY DELTA CHECK NOTED    MCH 30.7 26.0 - 34.0 pg   MCHC 34.8 30.0 - 36.0 g/dL   RDW 82.9 (H) 56.2 - 13.0 %   Platelets 27 (LL) 150 - 400 K/uL    Comment: SPECIMEN CHECKED FOR CLOTS CRITICAL VALUE NOTED.   VALUE IS CONSISTENT WITH PREVIOUSLY REPORTED AND CALLED VALUE. REPEATED TO VERIFY    nRBC 0.0 0.0 - 0.2 %    Comment: Performed at [facility], [address], [place], [place] [address]  Vitamin B12     Status: None   Collection Time: 10/08/22  5:39 AM  Result Value Ref Range   Vitamin B-12 835 180 - 914 pg/mL    Comment: (NOTE) This assay is not validated for testing neonatal or myeloproliferative syndrome specimens for Vitamin B12 levels. Performed at [facility], [address] [place], [place] [address]   Iron and TIBC     Status: Abnormal   Collection Time: 10/08/22  5:39 AM  Result Value Ref Range   Iron 245 (H) 28 - 170 ug/dL   TIBC 952 841 - 324 ug/dL   Saturation Ratios 92 (H) 10.4 - 31.8 %   UIBC 21 ug/dL    Comment: Performed at [facility], [address], [place], [place] [address]  Folate     Status: Abnormal   Collection Time: 10/08/22  5:39 AM  Result Value Ref Range   Folate 5.2 (L) >5.9 ng/mL    Comment: Performed at [facility], [address], [place], [place] [address]  TSH     Status: None   Collection Time: 10/08/22  5:39 AM  Result Value Ref Range   TSH 3.375 0.350 - 4.500 uIU/mL    Comment: Performed by a 3rd Generation assay with a functional sensitivity of <=0.01 uIU/mL. Performed at [facility], [address], [place], [place] [address]   Lipid panel     Status: Abnormal   Collection Time: 10/08/22  5:39 AM  Result Value Ref Range   Cholesterol 109 0 - 200 mg/dL   Triglycerides 474 <259 mg/dL   HDL 37 (L) >56 mg/dL   Total CHOL/HDL Ratio 2.9 RATIO   VLDL 25 0 - 40 mg/dL   LDL Cholesterol 47 0 - 99 mg/dL    Comment:        Total Cholesterol/HDL:CHD Risk Coronary Heart Disease Risk Table                     Men   Women  1/2 Average Risk   3.4  3.3  Average Risk       5.0   4.4  2 X Average Risk   9.6   7.1  3 X Average Risk  23.4   11.0         Use the calculated Patient Ratio above and the CHD Risk Table to determine the patient's CHD Risk.        ATP III CLASSIFICATION (LDL):  <100     mg/dL   Optimal  454-098  mg/dL   Near or Above                    Optimal  130-159  mg/dL   Borderline  119-147  mg/dL   High  >829     mg/dL   Very High Performed at [facility], [address], [place], [place] [address]   Troponin I (High Sensitivity)     Status: Abnormal   Collection Time: 10/08/22  5:39 AM  Result Value Ref Range   Troponin I (High Sensitivity) 152 (HH) <18 ng/L    Comment: DELTA CHECK NOTED CRITICAL RESULT CALLED TO, READ BACK BY AND VERIFIED WITH BLAIR,I. RN AT 0865 10/08/22 MULLINS,T (NOTE) Elevated high sensitivity troponin I (hsTnI) values and significant  changes across serial measurements may suggest ACS but many other  chronic and acute conditions are known to elevate hsTnI results.  Refer to the "Links" section for chest pain algorithms and additional  guidance. Performed at [facility], [address] [place], [place] [address]   Troponin I (High Sensitivity)     Status: Abnormal   Collection Time: 10/08/22  7:45 AM  Result Value Ref Range   Troponin I (High Sensitivity) 152 (HH) <18 ng/L    Comment: CRITICAL VALUE NOTED. VALUE IS CONSISTENT WITH PREVIOUSLY REPORTED/CALLED VALUE DELTA CHECK NOTED (NOTE) Elevated high sensitivity troponin I (hsTnI) values and significant  changes across serial measurements may suggest ACS but many other  chronic and acute conditions are known to elevate hsTnI results.  Refer to the "Links" section for chest pain algorithms and additional  guidance. Performed at [facility], [address], [place], [place] [address]   Troponin I (High Sensitivity)     Status: Abnormal   Collection Time: 10/08/22 11:55 AM  Result Value Ref Range   Troponin I (High Sensitivity) 213 (HH) <18 ng/L    Comment:  CRITICAL VALUE NOTED. VALUE IS CONSISTENT WITH PREVIOUSLY REPORTED/CALLED VALUE DELTA CHECK NOTED (NOTE) Elevated high sensitivity troponin I (hsTnI) values and significant  changes across serial measurements may suggest ACS but many other  chronic and acute conditions are known to elevate hsTnI results.  Refer to the "Links" section for chest pain algorithms and additional  guidance. Performed at [facility], [address], [place], [place] [address]   Magnesium     Status: Abnormal   Collection Time: 10/08/22 11:55 AM  Result Value Ref Range   Magnesium 1.5 (L) 1.7 - 2.4 mg/dL    Comment: Performed at [facility], [address], [place], [place] [address]   CT Angio Chest PE W and/or Wo Contrast  Result Date: 10/07/2022 CLINICAL DATA:  Concern for pulmonary embolism.  Short of breath. EXAM: CT ANGIOGRAPHY CHEST WITH CONTRAST TECHNIQUE: Multidetector CT imaging of the chest was performed using the standard protocol during bolus administration of intravenous contrast. Multiplanar CT image reconstructions and MIPs were obtained to evaluate the vascular anatomy. RADIATION DOSE REDUCTION:  This exam was performed according to the departmental dose-optimization program which includes automated exposure control, adjustment of the mA and/or kV according to patient size and/or use of iterative reconstruction technique. CONTRAST:  75mL OMNIPAQUE IOHEXOL 350 MG/ML SOLN COMPARISON:  None Available. FINDINGS: Cardiovascular: No filling defects within the pulmonary arteries to suggest acute pulmonary embolism. Coronary artery calcification and aortic atherosclerotic calcification. Mediastinum/Nodes: No axillary or supraclavicular adenopathy. No mediastinal or hilar adenopathy. No pericardial fluid. Esophagus normal. Lungs/Pleura: No pulmonary infarction. No pneumonia. No pleural fluid. No pneumothorax Upper lobe centrilobular emphysema. Upper Abdomen: Limited view of  the liver, kidneys, pancreas are unremarkable. Normal adrenal glands. Musculoskeletal: No aggressive osseous lesion. Compression deformity in lower thoracic spine is chronic. Augmentation lower thoracic spine. Chronic compression fracture in the upper thoracic spine additionally. No change from 08/20/2022. Review of the MIP images confirms the above findings. IMPRESSION: 1. No evidence acute pulmonary embolism. 2. No acute pulmonary findings. 3. Aortic Atherosclerosis (ICD10-I70.0) and Emphysema (ICD10-J43.9). Electronically Signed   By: Genevive Bi M.D.   On: 10/07/2022 18:42   DG Chest Port 1 View  Result Date: 10/07/2022 CLINICAL DATA:  Shortness of breath. EXAM: PORTABLE CHEST 1 VIEW COMPARISON:  Chest radiograph 09/07/2022. FINDINGS: Rotated patient. Right chest Port-A-Cath tip projects over the low SVC. No consolidation or pulmonary edema. No pleural effusion or pneumothorax. Prior T12 kyphoplasty. IMPRESSION: No evidence of acute cardiopulmonary disease. Electronically Signed   By: Orvan Falconer M.D.   On: 10/07/2022 17:13    Pending Labs Unresulted Labs (From admission, onward)     Start     Ordered   10/08/22 0329  CBC  Now then every 4 hours,   R (with TIMED occurrences)      10/08/22 0329   Unscheduled  Occult blood card to lab, stool  As needed,   R      10/08/22 0329            Vitals/Pain Today's Vitals   10/08/22 0343 10/08/22 0735 10/08/22 0800 10/08/22 1117  BP:  (!) 150/69 (!) 163/72 (!) 147/62  Pulse:  82 86 74  Resp:  (!) 26 (!) 21 19  Temp: 98.2 F (36.8 C) 98.2 F (36.8 C)  98.2 F (36.8 C)  TempSrc: Oral Oral  Oral  SpO2:  97% 95% 93%  PainSc:        Isolation Precautions No active isolations  Medications Medications  traMADol (ULTRAM) tablet 25 mg (has no administration in time range)  amiodarone (PACERONE) tablet 200 mg (200 mg Oral Given 10/08/22 1130)  atorvastatin (LIPITOR) tablet 20 mg (20 mg Oral Given 10/08/22 0926)  metoprolol tartrate  (LOPRESSOR) tablet 12.5 mg (12.5 mg Oral Given 10/08/22 0926)  famotidine (PEPCID) tablet 20 mg (20 mg Oral Given 10/08/22 0926)  lidocaine-prilocaine (EMLA) cream 1 Application (has no administration in time range)  sodium chloride flush (NS) 0.9 % injection 3 mL (3 mLs Intravenous Given 10/08/22 0939)  0.9 %  sodium chloride infusion ( Intravenous New Bag/Given 10/08/22 0505)  acetaminophen (TYLENOL) tablet 650 mg (has no administration in time range)    Or  acetaminophen (TYLENOL) suppository 650 mg (has no administration in time range)  HYDROcodone-acetaminophen (NORCO/VICODIN) 5-325 MG per tablet 1-2 tablet (has no administration in time range)  morphine (PF) 2 MG/ML injection 1 mg (has no administration in time range)  traZODone (DESYREL) tablet 25 mg (has no administration in time range)  senna-docusate (Senokot-S) tablet 1 tablet (has no administration in time range)  bisacodyl (  DULCOLAX) EC tablet 5 mg (has no administration in time range)  ondansetron (ZOFRAN) tablet 4 mg (has no administration in time range)    Or  ondansetron (ZOFRAN) injection 4 mg (has no administration in time range)  albuterol (PROVENTIL) (2.5 MG/3ML) 0.083% nebulizer solution 2.5 mg (has no administration in time range)  ipratropium (ATROVENT) nebulizer solution 0.5 mg (has no administration in time range)  hydrALAZINE (APRESOLINE) injection 5 mg (has no administration in time range)  pantoprazole (PROTONIX) injection 40 mg (40 mg Intravenous Given 10/08/22 0927)  potassium chloride 10 mEq in 100 mL IVPB (0 mEq Intravenous Stopped 10/08/22 1130)  0.9 %  sodium chloride infusion (Manually program via Guardrails IV Fluids) (0 mLs Intravenous Stopped 10/07/22 1935)  iohexol (OMNIPAQUE) 350 MG/ML injection 75 mL (75 mLs Intravenous Contrast Given 10/07/22 1828)    Mobility walks with device

## 2022-10-08 NOTE — Hospital Course (Signed)
77 y.o. female with a known history of anxiety, anemia, HTN SCLC last and final chemotherapy 7/26 and last radiation 8/1 chronic thrombocytopenia presents to the emergency department for evaluation of weakness and shortness of breath. Pt was found to have hgb 6 and plts of 7. Pt was admitted for further work up. During her stay, pt was found to have troponin that trended up to over 200

## 2022-10-08 NOTE — Progress Notes (Signed)
  Progress Note   Patient: Sheena [name] ZOX:[id number] DOB: December 29, 1945 DOA: 10/07/2022     1 DOS: the patient was seen and examined on 10/08/2022   Brief hospital course: 77 y.o. female with a known history of anxiety, anemia, HTN SCLC last and final chemotherapy 7/26 and last radiation 8/1 chronic thrombocytopenia presents to the emergency department for evaluation of weakness and shortness of breath. Pt was found to have hgb 6 and plts of 7. Pt was admitted for further work up. During her stay, pt was found to have troponin that trended up to over 200  Assessment and Plan: Symptomatic anemia with thrombocytopenia -Pt given 2 units PRBC's and 1 unit plts with appropriate correction -Discussed case with Oncology. Recs to keep Hgb >8 and plts >20k -recheck cbc in AM -Hemodynamically stable  Limited stage small cell lung ca -Had been on systemic chemo -Pt to cont to f/u with Oncology  Demand ischemia -when seen, pt without chest pains -Trop did trend to a peak of 213 -Appreciate input by Cardiology. Pt is not considered a candidate for invasive eval and as she is asymptomatic, cardiology would not pursue additional work up at this time  Hypokalemia -given replacement -Recheck bmet in AM  ARF -normalized  HLD  -continue statin per home regimen  HTN -BP stable at this time -continued on metoprolol  GERD -On fenofibrate and protonix     Subjective: Without complaints this AM. Denies chest pain  Physical Exam: Vitals:   10/08/22 0800 10/08/22 1117 10/08/22 1438 10/08/22 1527  BP: (!) 163/72 (!) 147/62 (!) 167/88 (!) 125/53  Pulse: 86 74 94 85  Resp: (!) 21 19 (!) 27 16  Temp:  98.2 F (36.8 C) 98.1 F (36.7 C) 98 F (36.7 C)  TempSrc:  Oral  Oral  SpO2: 95% 93% 100% 100%  Weight:    44.5 kg  Height:    5' (1.524 m)   General exam: Awake, laying in bed, in nad Respiratory system: Normal respiratory effort, no wheezing Cardiovascular system: regular rate, s1,  s2 Gastrointestinal system: Soft, nondistended, positive BS Central nervous system: CN2-12 grossly intact, strength intact Extremities: Perfused, no clubbing Skin: Normal skin turgor, no notable skin lesions seen Psychiatry: Mood normal // no visual hallucinations   Data Reviewed:  Labs reviewed: Na 136, K 2.9, Cr 0.95, trop 213, WBC 20.8, Hgb 11.9, plts 20k   Family Communication: Pt in room, family at bedside  Disposition: Status is: Inpatient Remains inpatient appropriate because: severity of illness  Planned Discharge Destination: Home    Author: Rickey Barbara, MD 10/08/2022 6:53 PM  For on call review [email or web address].
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
## 2022-10-08 NOTE — Progress Notes (Signed)
CHART NOTE This patient is a 77 years old white female with limited stage small cell lung cancer diagnosed in April 2024 and currently on systemic chemotherapy with carboplatin and etoposide status post 4 cycles.  She presented to the office yesterday for lab work and she was found to have significant anemia and thrombocytopenia.  This was late on the day and we were not able to provide her with PRBCs and platelet transfusion for her condition.  She was sent to the emergency department.  She received 2 units of PRBCs transfusion and 1 unit of platelet and her number has improved overnight.  Hemoglobin this morning is 11.1 and hematocrit 31.9% with platelets count of 27,000. The patient has elevated troponin and would need evaluation by cardiology.  We will try to keep her hemoglobin above 8.0 and platelets over 20,000 during this admission. It may take longer time for her platelet to recover which is expected after systemic chemotherapy. Thank you for seeing her please let me know if you have any questions.

## 2022-10-09 DIAGNOSIS — R7989 Other specified abnormal findings of blood chemistry: Secondary | ICD-10-CM | POA: Diagnosis not present

## 2022-10-09 DIAGNOSIS — N179 Acute kidney failure, unspecified: Secondary | ICD-10-CM | POA: Diagnosis not present

## 2022-10-09 DIAGNOSIS — D649 Anemia, unspecified: Secondary | ICD-10-CM | POA: Diagnosis not present

## 2022-10-09 LAB — BASIC METABOLIC PANEL
Anion gap: 7 (ref 5–15)
BUN: 9 mg/dL (ref 8–23)
CO2: 25 mmol/L (ref 22–32)
Calcium: 8 mg/dL — ABNORMAL LOW (ref 8.9–10.3)
Chloride: 105 mmol/L (ref 98–111)
Creatinine, Ser: 0.83 mg/dL (ref 0.44–1.00)
GFR, Estimated: >60 mL/min (ref >60)
Glucose, Bld: 97 mg/dL (ref 70–99)
Potassium: 5.1 mmol/L (ref 3.5–5.1)
Sodium: 137 mmol/L (ref 135–145)

## 2022-10-09 LAB — CBC
HCT: 27.6 % — ABNORMAL LOW (ref 36.0–46.0)
Hemoglobin: 9.3 g/dL — ABNORMAL LOW (ref 12.0–15.0)
MCH: 31.1 pg (ref 26.0–34.0)
MCHC: 33.7 g/dL (ref 30.0–36.0)
MCV: 92.3 fL (ref 80.0–100.0)
Platelets: 22 10*3/uL — CL (ref 150–400)
RBC: 2.99 MIL/uL — ABNORMAL LOW (ref 3.87–5.11)
RDW: 16.6 % — ABNORMAL HIGH (ref 11.5–15.5)
WBC: 15.8 10*3/uL — ABNORMAL HIGH (ref 4.0–10.5)
nRBC: 0 % (ref 0.0–0.2)

## 2022-10-09 LAB — BPAM PLATELET PHERESIS
Blood Product Expiration Date: 202408112359
ISSUE DATE / TIME: 202408090614
Unit Type and Rh: 6200

## 2022-10-09 LAB — PREPARE PLATELET PHERESIS: Unit division: 0

## 2022-10-09 LAB — MAGNESIUM: Magnesium: 2.7 mg/dL — ABNORMAL HIGH (ref 1.7–2.4)

## 2022-10-09 MED ORDER — POTASSIUM CHLORIDE CRYS ER 20 MEQ PO TBCR
40.0000 meq | EXTENDED_RELEASE_TABLET | ORAL | Status: AC
  Administered 2022-10-09 (×2): 40 meq via ORAL
  Filled 2022-10-09 (×2): qty 2

## 2022-10-09 MED ORDER — POTASSIUM CHLORIDE CRYS ER 20 MEQ PO TBCR
40.0000 meq | EXTENDED_RELEASE_TABLET | Freq: Two times a day (BID) | ORAL | Status: DC
  Administered 2022-10-09: 40 meq via ORAL
  Filled 2022-10-09: qty 2

## 2022-10-09 MED ORDER — HEPARIN SOD (PORK) LOCK FLUSH 100 UNIT/ML IV SOLN
500.0000 [IU] | INTRAVENOUS | Status: AC | PRN
  Administered 2022-10-09: 500 [IU]
  Filled 2022-10-09: qty 5

## 2022-10-09 MED ORDER — SODIUM CHLORIDE 0.9% IV SOLUTION: Freq: Once | INTRAVENOUS | Status: DC

## 2022-10-09 NOTE — TOC Initial Note (Signed)
Transition of Care [facility]) - Initial/Assessment Note    Patient Details  Name: Sheena [name] MRN: [id number] Date of Birth: 1945/06/19  Transition of [facility] CM/SW Contact:    Otelia Santee, LCSW Phone Number: 10/09/2022, 2:33 PM  Clinical Narrative:                 Met ewith pt and spouse at bedside and confirmed plan to return home with home health services. Pt shares she has not had home health services before and does not have preference for [facility]. HHPT has been arranged with [facility]. HH order will need to be placed prior to discharge.  Pt also recommended for BSC. CSW informed pt that this would be private pay as insurance does not cover this DME. Pt's spouse is to obtain [facility] from pharmacy/supply company on own.    Expected Discharge Plan: Home w Home Health Services Barriers to Discharge: No Barriers Identified   Patient Goals and CMS Choice Patient states their goals for this hospitalization and ongoing recovery are:: To return home CMS Medicare.gov Compare Post Acute Care list provided to:: Patient Choice offered to / list presented to : Patient [place] ownership interest in [facility].provided to:: Patient    Expected Discharge Plan and Services In-house Referral: Clinical Social Work Discharge Planning Services: NA Post Acute Care Choice: Home Health, Durable Medical Equipment Living arrangements for the past 2 months: Single Family Home                 DME Arranged: N/A DME Agency: NA       HH Arranged: PT HH Agency: [facility] Date HH Agency Contacted: 10/09/22 Time HH Agency Contacted: 1432 Representative spoke with at [facility] Agency: Tresa Endo  Prior Living Arrangements/Services Living arrangements for the past 2 months: Single Family Home Lives with:: Spouse Patient language and need for interpreter reviewed:: Yes Do you feel safe going back to the place where you live?: Yes      Need for Family Participation in Patient Care: No  (Comment) Care giver support system in place?: Yes (comment) Current home services: DME Criminal Activity/Legal Involvement Pertinent to Current Situation/Hospitalization: No - Comment as needed  Activities of Daily Living Home Assistive Devices/Equipment: None ADL Screening (condition at time of admission) Patient's cognitive ability adequate to safely complete daily activities?: Yes Is the patient deaf or have difficulty hearing?: No Does the patient have difficulty seeing, even when wearing glasses/contacts?: No Does the patient have difficulty concentrating, remembering, or making decisions?: No Patient able to express need for assistance with ADLs?: Yes Does the patient have difficulty dressing or bathing?: Yes Independently performs ADLs?: No Communication: Needs assistance Is this a change from baseline?: Pre-admission baseline Dressing (OT): Needs assistance Is this a change from baseline?: Pre-admission baseline Grooming: Needs assistance Is this a change from baseline?: Pre-admission baseline Feeding: Needs assistance Is this a change from baseline?: Pre-admission baseline Bathing: Needs assistance Is this a change from baseline?: Pre-admission baseline Toileting: Needs assistance Is this a change from baseline?: Pre-admission baseline In/Out Bed: Independent Walks in Home: Independent Does the patient have difficulty walking or climbing stairs?: Yes Weakness of Legs: Both Weakness of Arms/Hands: Both  Permission Sought/Granted Permission sought to share information with : Facility Medical sales representative, Family Supports Permission granted to share information with : Yes, Verbal Permission Granted  Share Information with NAME: Sible Gatz  Permission granted to share info w AGENCY: [facility]  Permission granted to share info w Relationship: Spouse  Permission granted to share info w Contact Information: [phone]  Emotional Assessment Appearance:: Appears older than  stated age Attitude/Demeanor/Rapport: Engaged Affect (typically observed): Pleasant Orientation: : Oriented to Self, Oriented to Place, Oriented to  Time, Oriented to Situation Alcohol / Substance Use: Not Applicable Psych Involvement: No (comment)  Admission diagnosis:  Thrombocytopenia (HCC) [D69.6] Elevated troponin [R79.89] AKI (acute kidney injury) (HCC) [N17.9] Symptomatic anemia [D64.9] Patient Active Problem List   Diagnosis Date Noted   Symptomatic anemia 10/07/2022   New onset atrial fibrillation (HCC) 09/10/2022   Hypokalemia 09/10/2022   Hypomagnesemia 09/10/2022   Pancytopenia (HCC) 09/07/2022   Port-A-Cath in place 08/11/2022   Primary small cell carcinoma of lower lobe of left lung (HCC) 07/08/2022   Adenopathy 06/30/2022   Aortic atherosclerosis (HCC) 01/18/2020   Grade II diastolic dysfunction 03/06/2015   Hyperlipidemia 03/05/2014   Former smoker 02/19/2014   Disequilibrium 02/19/2014   History of vertebral compression fracture 01/04/2013   Chronic low back pain 12/13/2012   Sinusitis, chronic 09/03/2012   Essential hypertension, benign 09/03/2012   Ataxia 04/01/2011   Anxiety 04/01/2011   Blurred vision 03/21/2010   Osteoporosis 03/21/2010   MURMUR 03/21/2010   Hyperglycemia 03/21/2010   PCP:  [name], MD Pharmacy:   [facility] [phone] [place], [place] - [address] [place] [address] Phone: [phone] Fax: [phone]     Social Determinants of Health (SDOH) Social History: SDOH Screenings   Food Insecurity: No Food Insecurity (10/08/2022)  Housing: Patient Declined (10/08/2022)  Transportation Needs: No Transportation Needs (10/08/2022)  Utilities: Not At Risk (10/08/2022)  Depression (PHQ2-9): Low Risk  (01/09/2022)  Financial Resource Strain: Low Risk  (01/09/2022)  Physical Activity: Inactive (01/09/2022)  Social Connections: Moderately Isolated (01/09/2022)  Stress: No  Stress Concern Present (01/09/2022)  Tobacco Use: Medium Risk (10/07/2022)   SDOH Interventions:     Readmission Risk Interventions    10/09/2022    2:31 PM  Readmission Risk Prevention Plan  Transportation Screening Complete  PCP or Specialist Appt within 3-5 Days Complete  HRI or Home Care Consult Complete  Social Work Consult for Recovery Care Planning/Counseling Complete  Palliative Care Screening Not Applicable  Medication Review Oceanographer) Complete

## 2022-10-09 NOTE — Discharge Summary (Signed)
Physician Discharge Summary   Patient: Sheena [name] MRN: [id number] DOB: May 18, 1945  Admit date:     10/07/2022  Discharge date: 10/09/22  Discharge Physician: Rickey Barbara   PCP: [name], MD   Recommendations at discharge:    Follow up with PCP in 1-2 weeks Follow up with Cardiology as scheduled Follow up with Dr. Arbutus Ped as scheduled  Discharge Diagnoses: Principal Problem:   Symptomatic anemia  Resolved Problems:   * No resolved hospital problems. *  Hospital Course: 77 y.o. female with a known history of anxiety, anemia, HTN SCLC last and final chemotherapy 7/26 and last radiation 8/1 chronic thrombocytopenia presents to the emergency department for evaluation of weakness and shortness of breath. Pt was found to have hgb 6 and plts of 7. Pt was admitted for further work up. During her stay, pt was found to have troponin that trended up to over 200  Assessment and Plan: Symptomatic anemia with thrombocytopenia -Pt given 2 units PRBC's and 2 unit plts this visit -Discussed case with Oncology. Recs to keep Hgb >8 and plts >20k -Hgb and Plts were appropriately corrected by day of d/c -Remained hemodynamically stable   Limited stage small cell lung ca -Had been on systemic chemo -Pt to cont to f/u with Oncology   Demand ischemia -when seen, pt without chest pains -Trop did trend to a peak of 213 -Appreciate input by Cardiology. Pt is not considered a candidate for invasive eval and as she is asymptomatic, cardiology would not pursue additional work up at this time   Hypokalemia -replaced   ARF -normalized   HLD  -continue statin per home regimen   HTN -BP stable at this time -continued on metoprolol   GERD -On fenofibrate and protonix    Consultants: Cardiology, discussed case with Dr. Arbutus Ped Procedures performed:   Disposition: Home Diet recommendation:  Regular diet DISCHARGE MEDICATION: Allergies as of 10/09/2022       Reactions    Antihistamines, Diphenhydramine-type Other (See Comments)   palpitations, feel jittery   Aspirin Other (See Comments)   Stomach cramps   Evista [raloxifene] Other (See Comments)   Hot Flashes         Medication List     STOP taking these medications    amLODipine 5 MG tablet Commonly known as: NORVASC   cyclobenzaprine 5 MG tablet Commonly known as: FLEXERIL   potassium chloride 10 MEQ tablet Commonly known as: KLOR-CON M       TAKE these medications    acetaminophen 500 MG tablet Commonly known as: TYLENOL Take 1,000 mg by mouth 2 (two) times daily as needed for mild pain.   amiodarone 200 MG tablet Commonly known as: PACERONE Take 1 tablet (200 mg total) by mouth 2 (two) times daily for 5 days, THEN 1 tablet (200 mg total) daily. Follow up with cardiology outpatient for refills. Start taking on: September 14, 2022 What changed: See the new instructions.   atorvastatin 20 MG tablet Commonly known as: LIPITOR TAKE 1 TABLET BY MOUTH EVERY DAY   CALCIUM CITRATE PETITE/VIT D PO Take 1 tablet by mouth 2 (two) times daily.   famotidine 20 MG tablet Commonly known as: PEPCID Take 20 mg by mouth daily.   lidocaine-prilocaine cream Commonly known as: EMLA Apply 1 Application topically as needed. What changed: reasons to take this   magnesium oxide 400 (240 Mg) MG tablet Commonly known as: MAG-OX Take 1 tablet (400 mg total) by mouth 2 (two) times daily. Follow  your magnesium level outpatient with your PCP   metoprolol tartrate 25 MG tablet Commonly known as: LOPRESSOR Take 0.5 tablets (12.5 mg total) by mouth 2 (two) times daily.   PRESCRIPTION MEDICATION Apply 1 application  topically in the morning and at bedtime. Unknown cream given to Pt from the cancer center for after radiation   prochlorperazine 10 MG tablet Commonly known as: COMPAZINE Take 1 tablet (10 mg total) by mouth every 6 (six) hours as needed for nausea or vomiting.   sucralfate 1 GM/10ML  suspension Commonly known as: Carafate Take 10 mLs (1 g total) by mouth 4 (four) times daily -  with meals and at bedtime. What changed: when to take this   traMADol 50 MG tablet Commonly known as: ULTRAM Take 0.5-1 tablets (25-50 mg total) by mouth 2 (two) times daily as needed. For chronic low back pain. May refill monthly What changed:  how much to take when to take this reasons to take this additional instructions        Follow-up Information     Hilty, Lisette Abu, MD Follow up on 10/14/2022.   Specialty: Cardiology Why: at 10:30 A with his PA Angie Duke   please arrive 15 min early to check in please. Contact information: [address] [place] [address] [phone]         [name], MD Follow up in 2 week(s).   Specialty: Family Medicine Why: Hospital follow up Contact information: [address] [place] [address] [phone]         Si Gaul, MD Follow up.   Specialty: Oncology Why: as scheduled Contact information: [address] [place] [address] [phone]                Discharge Exam: Ceasar Mons Weights   10/08/22 1527  Weight: 44.5 kg   General exam: Awake, laying in bed, in nad Respiratory system: Normal respiratory effort, no wheezing Cardiovascular system: regular rate, s1, s2 Gastrointestinal system: Soft, nondistended, positive BS Central nervous system: CN2-12 grossly intact, strength intact Extremities: Perfused, no clubbing Skin: Normal skin turgor, no notable skin lesions seen Psychiatry: Mood normal // no visual hallucinations   Condition at discharge: fair  The results of significant diagnostics from this hospitalization (including imaging, microbiology, ancillary and laboratory) are listed below for reference.   Imaging Studies: CT Angio Chest PE W and/or Wo Contrast  Result Date: 10/07/2022 CLINICAL DATA:  Concern for pulmonary embolism.  Short of breath.  EXAM: CT ANGIOGRAPHY CHEST WITH CONTRAST TECHNIQUE: Multidetector CT imaging of the chest was performed using the standard protocol during bolus administration of intravenous contrast. Multiplanar CT image reconstructions and MIPs were obtained to evaluate the vascular anatomy. RADIATION DOSE REDUCTION: This exam was performed according to the departmental dose-optimization program which includes automated exposure control, adjustment of the mA and/or kV according to patient size and/or use of iterative reconstruction technique. CONTRAST:  75mL OMNIPAQUE IOHEXOL 350 MG/ML SOLN COMPARISON:  None Available. FINDINGS: Cardiovascular: No filling defects within the pulmonary arteries to suggest acute pulmonary embolism. Coronary artery calcification and aortic atherosclerotic calcification. Mediastinum/Nodes: No axillary or supraclavicular adenopathy. No mediastinal or hilar adenopathy. No pericardial fluid. Esophagus normal. Lungs/Pleura: No pulmonary infarction. No pneumonia. No pleural fluid. No pneumothorax Upper lobe centrilobular emphysema. Upper Abdomen: Limited view of the liver, kidneys, pancreas are unremarkable. Normal adrenal glands. Musculoskeletal: No aggressive osseous lesion. Compression deformity in lower thoracic spine is chronic. Augmentation lower thoracic spine. Chronic compression fracture  in the upper thoracic spine additionally. No change from 08/20/2022. Review of the MIP images confirms the above findings. IMPRESSION: 1. No evidence acute pulmonary embolism. 2. No acute pulmonary findings. 3. Aortic Atherosclerosis (ICD10-I70.0) and Emphysema (ICD10-J43.9). Electronically Signed   By: Genevive Bi M.D.   On: 10/07/2022 18:42   DG Chest Port 1 View  Result Date: 10/07/2022 CLINICAL DATA:  Shortness of breath. EXAM: PORTABLE CHEST 1 VIEW COMPARISON:  Chest radiograph 09/07/2022. FINDINGS: Rotated patient. Right chest Port-A-Cath tip projects over the low SVC. No consolidation or pulmonary  edema. No pleural effusion or pneumothorax. Prior T12 kyphoplasty. IMPRESSION: No evidence of acute cardiopulmonary disease. Electronically Signed   By: Orvan Falconer M.D.   On: 10/07/2022 17:13   ECHOCARDIOGRAM COMPLETE  Result Date: 09/11/2022    ECHOCARDIOGRAM REPORT   Patient Name:   ARLIS FITZMAURICE Date of Exam: 09/11/2022 Medical Rec #:  [id number]      Height:       60.0 in Accession #:    [id number]     Weight:       104.0 lb Date of Birth:  1945/09/21      BSA:          1.414 m Patient Age:    77 years       BP:           114/48 mmHg Patient Gender: F              HR:           103 bpm. Exam Location:  Inpatient Procedure: 2D Echo, Cardiac Doppler, Color Doppler and Strain Analysis Indications:    A-Fib I48.91  History:        Patient has prior history of Echocardiogram examinations, most                 recent 11/13/2012. Chemo therapy, Arrythmias:Atrial Fibrillation;                 Risk Factors:Hypertension, Dyslipidemia and Former Smoker.  Sonographer:    Dondra Prader RVT RCS Referring Phys: [phone] A [name]  Sonographer Comments: Technically challenging study due to limited acoustic windows and Technically difficult study due to poor echo windows. IMPRESSIONS  1. Left ventricular ejection fraction, by estimation, is 60 to 65%. The left ventricle has normal function. The left ventricle has no regional wall motion abnormalities. Left ventricular diastolic parameters were normal.  2. Right ventricular systolic function is normal. The right ventricular size is normal.  3. The mitral valve is abnormal. No evidence of mitral valve regurgitation. No evidence of mitral stenosis.  4. The aortic valve was not well visualized. There is mild calcification of the aortic valve. There is mild thickening of the aortic valve. Aortic valve regurgitation is trivial. Mild aortic valve stenosis.  5. The inferior vena cava is normal in size with greater than 50% respiratory variability, suggesting right atrial  pressure of 3 mmHg. FINDINGS  Left Ventricle: Left ventricular ejection fraction, by estimation, is 60 to 65%. The left ventricle has normal function. The left ventricle has no regional wall motion abnormalities. The left ventricular internal cavity size was normal in size. There is  no left ventricular hypertrophy. Left ventricular diastolic parameters were normal. Right Ventricle: The right ventricular size is normal. No increase in right ventricular wall thickness. Right ventricular systolic function is normal. Left Atrium: Left atrial size was normal in size. Right Atrium: Right atrial size was normal in size. Pericardium: There  is no evidence of pericardial effusion. Mitral Valve: The mitral valve is abnormal. There is mild thickening of the mitral valve leaflet(s). No evidence of mitral valve regurgitation. No evidence of mitral valve stenosis. Tricuspid Valve: The tricuspid valve is normal in structure. Tricuspid valve regurgitation is not demonstrated. No evidence of tricuspid stenosis. Aortic Valve: The aortic valve was not well visualized. There is mild calcification of the aortic valve. There is mild thickening of the aortic valve. Aortic valve regurgitation is trivial. Aortic regurgitation PHT measures 309 msec. Mild aortic stenosis  is present. Aortic valve mean gradient measures 6.0 mmHg. Aortic valve peak gradient measures 11.7 mmHg. Aortic valve area, by VTI measures 1.70 cm. Pulmonic Valve: The pulmonic valve was normal in structure. Pulmonic valve regurgitation is mild. No evidence of pulmonic stenosis. Aorta: The aortic root is normal in size and structure and the ascending aorta was not well visualized. Venous: The inferior vena cava is normal in size with greater than 50% respiratory variability, suggesting right atrial pressure of 3 mmHg. IAS/Shunts: No atrial level shunt detected by color flow Doppler.  LEFT VENTRICLE PLAX 2D LVIDd:         3.50 cm   Diastology LVIDs:         2.30 cm   LV e'  medial:    9.57 cm/s LV PW:         0.80 cm   LV E/e' medial:  11.6 LV IVS:        0.80 cm   LV e' lateral:   8.92 cm/s LVOT diam:     1.60 cm   LV E/e' lateral: 12.4 LV SV:         41 LV SV Index:   29 LVOT Area:     2.01 cm  RIGHT VENTRICLE             IVC RV Basal diam:  2.90 cm     IVC diam: 1.90 cm RV S prime:     17.00 cm/s TAPSE (M-mode): 3.0 cm LEFT ATRIUM             Index        RIGHT ATRIUM          Index LA diam:        3.00 cm 2.12 cm/m   RA Area:     9.31 cm LA Vol (A2C):   42.1 ml 29.78 ml/m  RA Volume:   20.90 ml 14.78 ml/m LA Vol (A4C):   36.7 ml 25.96 ml/m LA Biplane Vol: 38.9 ml 27.52 ml/m  AORTIC VALVE                     PULMONIC VALVE AV Area (Vmax):    1.56 cm      PV Vmax:       0.78 m/s AV Area (Vmean):   1.58 cm      PV Peak grad:  2.5 mmHg AV Area (VTI):     1.70 cm AV Vmax:           171.00 cm/s AV Vmean:          115.000 cm/s AV VTI:            0.242 m AV Peak Grad:      11.7 mmHg AV Mean Grad:      6.0 mmHg LVOT Vmax:         133.00 cm/s LVOT Vmean:        90.300 cm/s  LVOT VTI:          0.205 m LVOT/AV VTI ratio: 0.85 AI PHT:            309 msec  AORTA Ao Root diam: 2.40 cm MITRAL VALVE MV Area (PHT): 4.83 cm     SHUNTS MV Decel Time: 157 msec     Systemic VTI:  0.20 m MV E velocity: 111.00 cm/s  Systemic Diam: 1.60 cm MV A velocity: 129.00 cm/s MV E/A ratio:  0.86 Charlton Haws MD Electronically signed by Charlton Haws MD Signature Date/Time: 09/11/2022/2:19:22 PM    Final     Microbiology: Results for orders placed or performed in visit on 06/26/22  Novel Coronavirus, NAA ([facility])     Status: None   Collection Time: 06/26/22 12:00 AM   Specimen: Nasopharyngeal(NP) swabs in vial transport medium   Nasopharynge  Resident  Result Value Ref Range Status   SARS-CoV-2, NAA Not Detected Not Detected Final    Comment: This nucleic acid amplification test was developed and its performance characteristics determined by [employer]. Nucleic acid amplification tests  include RT-PCR and TMA. This test has not been FDA cleared or approved. This test has been authorized by FDA under an Emergency Use Authorization (EUA). This test is only authorized for the duration of time the declaration that circumstances exist justifying the authorization of the emergency use of in vitro diagnostic tests for detection of SARS-CoV-2 virus and/or diagnosis of COVID-19 infection under section 564(b)(1) of the Act, 21 U.S.C. 147WGN-5(A) (1), unless the authorization is terminated or revoked sooner. When diagnostic testing is negative, the possibility of a false negative result should be considered in the context of a patient's recent exposures and the presence of clinical signs and symptoms consistent with COVID-19. An individual without symptoms of COVID-19 and who is not shedding SARS-CoV-2 virus wo uld expect to have a negative (not detected) result in this assay.     Labs: CBC: Recent Labs  Lab 10/07/22 1322 10/07/22 1639 10/08/22 0539 10/08/22 1619 10/09/22 0240 10/09/22 1525  WBC 14.1* 18.4* 18.3* 20.8* 17.3* 15.8*  NEUTROABS 11.7* 15.5*  --   --   --   --   HGB 6.4* 6.0* 11.1* 11.9* 9.2* 9.3*  HCT 18.5* 18.5* 31.9* 32.6* 26.9* 27.6*  MCV 96.9 99.5 88.4 87.6 90.0 92.3  PLT 7* 8* 27* 20* 17* 22*   Basic Metabolic Panel: Recent Labs  Lab 10/07/22 1322 10/07/22 1639 10/08/22 0539 10/08/22 1155 10/09/22 0200 10/09/22 0240 10/09/22 1525  NA 137 136 136  --   --  131* 137  K 3.4* 3.4* 2.9*  --   --  2.7* 5.1  CL 98 98 97*  --   --  98 105  CO2 30 27 28   --   --  27 25  GLUCOSE 137* 240* 104*  --   --  91 97  BUN 15 17 12   --   --  10 9  CREATININE 0.98 1.20* 0.95  --   --  0.88 0.83  CALCIUM 9.1 9.2 8.8*  --   --  7.5* 8.0*  MG  --   --   --  1.5* 2.7*  --   --    Liver Function Tests: Recent Labs  Lab 10/07/22 1322 10/07/22 1639 10/08/22 0539 10/09/22 0240  AST 11* 19 17 14*  ALT 8 12 10 10   ALKPHOS 103 105 109 90  BILITOT 0.4 0.6 0.9  0.6  PROT 6.1* 6.2* 6.2*  5.0*  ALBUMIN 3.5 3.3* 3.4* 2.6*   CBG: No results for input(s): "GLUCAP" in the last 168 hours.  Discharge time spent: less than 30 minutes.  Signed: Rickey Barbara, MD [facility] 10/09/2022

## 2022-10-09 NOTE — Progress Notes (Signed)
   Patient Name: Sheena [name] Date of Encounter: 10/09/2022 [facility] Cardiologist: Chrystie Nose, MD   Interval Summary  .    77 y.o. female with a hx of lung cancer-small cell , pancytopenia, undergoing chemo and XRT was seen 09/10/22 by cards for atrial fib RVR and converted to SR.  Now admitted with acute anemia and elevated troponin- demand ischemia with acute anemia.    Today no chest pain, no SOB,  the SOB has improved a great deal since transfusion. She still feels weak.   Vital Signs .    Vitals:   10/09/22 0314 10/09/22 0613 10/09/22 0615 10/09/22 0630  BP: (!) 109/48 (!) 127/54 (!) 127/54 (!) 124/46  Pulse: 61 68 68 69  Resp: 16 18    Temp: 98.2 F (36.8 C) 98 F (36.7 C) 98 F (36.7 C) 98 F (36.7 C)  TempSrc:   Oral Oral  SpO2: 99% 100% 100% 99%  Weight:      Height:        Intake/Output Summary (Last 24 hours) at 10/09/2022 0833 Last data filed at 10/09/2022 0615 Gross per 24 hour  Intake 2089.57 ml  Output --  Net 2089.57 ml      10/08/2022    3:27 PM 09/22/2022    9:04 AM 09/07/2022   12:16 PM  Last 3 Weights  Weight (lbs) 98 lb 95 lb 104 lb  Weight (kg) 44.453 kg 43.092 kg 47.174 kg      Telemetry/ECG    SR - Personally Reviewed  Physical Exam .   GEN: No acute distress.   Neck: No JVD Cardiac: RRR, no murmurs, rubs, or gallops.  Respiratory: Clear to auscultation bilaterally. GI: Soft, nontender, non-distended  MS: No edema  Assessment & Plan .     Elevated Troponin pk 213- no chest pain and in combination of Hgb 6.0  demand ischemia no further testing.   Hypokalemia has had 2 runs of 10 of KCL yesterday today K+ 2.7 per IM --Mg+ up from 1.5 to 2.7  --Hyponatremia today at 131  Symptomatic anemia and thrombocytopenia wit chemo. Has been transfused and plts given. Per IM and oncology  --WBC 17.2 Hgb 9.2 and plts 17 today   PAF on last admit now maintaining SR to ST on amiodarone 200 mg daily and lopressor 12.5 BID no  anticoagulation due to episodic anemia and thrombocytopenia  --TSH 3.375 on amio and LFTs stable  HTN/HLD continue meds   For questions or updates, please contact [facility] Please consult [email or web address] for contact info under        Signed, Nada Boozer, NP

## 2022-10-09 NOTE — Progress Notes (Signed)
Port deaccessed by IV team.  Discharge instructions reviewed with pt.  Pt awaiting ride.

## 2022-10-09 NOTE — Evaluation (Addendum)
Physical Therapy Evaluation Patient Details Name: Sheena [name] MRN: [id number] DOB: 1945-09-05 Today's Date: 10/09/2022  History of Present Illness  Sheena [name] is a 77 y.o. female admitted with symptomatic anemia. Pt s/p 2 units PRBC's and 1 unit plts. PMH: anxiety, anemia, HTN SCLC last and final chemotherapy 7/26 and last radiation 8/1 chronic thrombocytopenia presents to the emergency department for evaluation of weakness and shortness of breath.  Clinical Impression  Pt admitted with above diagnosis. Pt from home, spouse has been assisting with transfers, self care and HHA ambulation in the home as needed. Pt currently needing min A with mobility, using RW for ambulation. Pt denies pain, SOB, dizziness throughout evaluation. Pt with good family support, recommend HHPT at discharge and BSC due to pt reporting low seated toilet with increased difficulty powering up to stand. Pt reports bedroom/master bath upstairs, contemplating converting main level den into master bedroom to avoid 14 stairs to second level. Pt currently with functional limitations due to the deficits listed below (see PT Problem List). Pt will benefit from acute skilled PT to increase their independence and safety with mobility to allow discharge.           If plan is discharge home, recommend the following: A little help with walking and/or transfers;A little help with bathing/dressing/bathroom;Assistance with cooking/housework;Assist for transportation;Help with stairs or ramp for entrance   Can travel by private vehicle        Equipment Recommendations BSC/3in1  Recommendations for Other Services       Functional Status Assessment Patient has had a recent decline in their functional status and demonstrates the ability to make significant improvements in function in a reasonable and predictable amount of time.     Precautions / Restrictions Precautions Precautions: Fall Restrictions Weight Bearing  Restrictions: No      Mobility  Bed Mobility Overal bed mobility: Needs Assistance Bed Mobility: Supine to Sit, Sit to Supine     Supine to sit: Min assist, HOB elevated, Used rails Sit to supine: Min assist   General bed mobility comments: min A to upright trunk into sitting and scoot out to EOB, put using bedrail and pulling on therapist's hand; min A to lift BLE Back into bed    Transfers Overall transfer level: Needs assistance Equipment used: Rolling walker (2 wheels) Transfers: Sit to/from Stand Sit to Stand: Min assist, From elevated surface           General transfer comment: min A to power up from elevated bed, verbal cues for hand placement    Ambulation/Gait Ambulation/Gait assistance: Min assist Gait Distance (Feet): 30 Feet Assistive device: Rolling walker (2 wheels) Gait Pattern/deviations: Decreased stride length, Trunk flexed, Step-through pattern Gait velocity: decreased     General Gait Details: slow steps with minimal bil foot clearance and short steps, trunk slightly flexed, good steadiness without overt LOB, limited by fatigue  Stairs            Wheelchair Mobility     Tilt Bed    Modified Rankin (Stroke Patients Only)       Balance Overall balance assessment: Needs assistance Sitting-balance support: Feet supported Sitting balance-Leahy Scale: Fair     Standing balance support: Reliant on assistive device for balance, During functional activity, Bilateral upper extremity supported Standing balance-Leahy Scale: Poor                               Pertinent Vitals/Pain Pain  Assessment Pain Assessment: No/denies pain    Home Living Family/patient expects to be discharged to:: Private residence Living Arrangements: Spouse/significant other Available Help at Discharge: Family;Available 24 hours/day Type of Home: House Home Access: Stairs to enter Entrance Stairs-Rails: Right Entrance Stairs-Number of Steps:  3 Alternate Level Stairs-Number of Steps: 14 steps Home Layout: Two level;Bed/bath upstairs Home Equipment: Cane - single Librarian, academic (2 wheels)      Prior Function Prior Level of Function : Needs assist             Mobility Comments: pt reports amb arm in arm with spouse for household distances, spouse provides light assist with transfers from seated and OOB ADLs Comments: pt reports spouse assists with self care as needed, spouse completes household chores, spouse drives     Extremity/Trunk Assessment   Upper Extremity Assessment Upper Extremity Assessment: Overall WFL for tasks assessed    Lower Extremity Assessment Lower Extremity Assessment: Generalized weakness (AROM WFL, strength grossly 3+/5, denies numbness/tingling)    Cervical / Trunk Assessment Cervical / Trunk Assessment: Kyphotic  Communication      Cognition Arousal: Alert Behavior During Therapy: WFL for tasks assessed/performed Overall Cognitive Status: Within Functional Limits for tasks assessed                                          General Comments      Exercises     Assessment/Plan    PT Assessment Patient needs continued PT services  PT Problem List Decreased strength;Decreased range of motion;Decreased activity tolerance;Decreased balance;Decreased mobility;Decreased knowledge of use of DME;Decreased safety awareness;Decreased knowledge of precautions;Cardiopulmonary status limiting activity       PT Treatment Interventions DME instruction;Gait training;Stair training;Functional mobility training;Therapeutic activities;Therapeutic exercise;Balance training;Neuromuscular re-education;Patient/family education    PT Goals (Current goals can be found in the Care Plan section)  Acute Rehab PT Goals Patient Stated Goal: get stronger PT Goal Formulation: With patient/family Time For Goal Achievement: 10/23/22 Potential to Achieve Goals: Good    Frequency Min  1X/week     Co-evaluation               AM-PAC PT "6 Clicks" Mobility  Outcome Measure Help needed turning from your back to your side while in a flat bed without using bedrails?: A Little Help needed moving from lying on your back to sitting on the side of a flat bed without using bedrails?: A Little Help needed moving to and from a bed to a chair (including a wheelchair)?: A Little Help needed standing up from a chair using your arms (e.g., wheelchair or bedside chair)?: A Little Help needed to walk in hospital room?: A Little Help needed climbing 3-5 steps with a railing? : A Little 6 Click Score: 18    End of Session Equipment Utilized During Treatment: Gait belt Activity Tolerance: Patient tolerated treatment well Patient left: in bed;with call bell/phone within reach;with nursing/sitter in room;with family/visitor present Nurse Communication: Mobility status PT Visit Diagnosis: Muscle weakness (generalized) (M62.81);Unsteadiness on feet (R26.81)    Time: 2952-8413 PT Time Calculation (min) (ACUTE ONLY): 24 min   Charges:   PT Evaluation $PT Eval Low Complexity: 1 Low PT Treatments $Therapeutic Activity: 8-22 mins PT General Charges $$ ACUTE PT VISIT: 1 Visit         Tori  PT, DPT 10/09/22, 1:47 PM

## 2022-10-09 NOTE — Progress Notes (Signed)
CRITICAL VALUES  K+ 2.7   Platelets 17  On call A. Virgel Manifold made aware new orders in

## 2022-10-10 ENCOUNTER — Other Ambulatory Visit: Payer: Self-pay

## 2022-10-12 ENCOUNTER — Telehealth: Payer: Self-pay | Admitting: Radiation Oncology

## 2022-10-12 ENCOUNTER — Telehealth: Payer: Self-pay
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
## 2022-10-12 NOTE — Telephone Encounter (Signed)
I called and noted the pt had just been admitted over the weekend and now home. I will check in next week to discuss PCI once she's had a few days to recover.

## 2022-10-12 NOTE — Transitions of Care (Post Inpatient/ED Visit) (Signed)
   10/12/2022  Name: Sheena [name] MRN: [id number] DOB: 07/08/1945  Today's TOC FU Call Status: Today's TOC FU Call Status:: Unsuccessful Call (1st Attempt) Unsuccessful Call (1st Attempt) Date: 10/12/22  Attempted to reach the patient regarding the most recent Inpatient/ED visit.  Follow Up Plan: Additional outreach attempts will be made to reach the patient to complete the Transitions of Care (Post Inpatient/ED visit) call.      Antionette Fairy, RN,BSN,CCM [facility] Care Management Care Management Community Coordinator Direct Phone: [phone] Toll Free: [phone] Fax: [phone]

## 2022-10-13 ENCOUNTER — Telehealth: Payer: Self-pay | Admitting: Medical Oncology

## 2022-10-13 ENCOUNTER — Inpatient Hospital Stay: Payer: 59

## 2022-10-13 ENCOUNTER — Telehealth: Payer: Self-pay

## 2022-10-13 DIAGNOSIS — C3432 Malignant neoplasm of lower lobe, left bronchus or lung: Secondary | ICD-10-CM | POA: Diagnosis not present

## 2022-10-13 DIAGNOSIS — Z79899 Other long term (current) drug therapy: Secondary | ICD-10-CM | POA: Diagnosis not present

## 2022-10-13 LAB — CBC WITH DIFFERENTIAL (CANCER CENTER ONLY)
Abs Immature Granulocytes: 0.17 10*3/uL — ABNORMAL HIGH (ref 0.00–0.07)
Basophils Absolute: 0.1 10*3/uL (ref 0.0–0.1)
Basophils Relative: 0 %
Eosinophils Absolute: 0 10*3/uL (ref 0.0–0.5)
Eosinophils Relative: 0 %
HCT: 33.6 % — ABNORMAL LOW (ref 36.0–46.0)
Hemoglobin: 11.4 g/dL — ABNORMAL LOW (ref 12.0–15.0)
Immature Granulocytes: 1 %
Lymphocytes Relative: 9 %
Lymphs Abs: 1.7 10*3/uL (ref 0.7–4.0)
MCH: 31.2 pg (ref 26.0–34.0)
MCHC: 33.9 g/dL (ref 30.0–36.0)
MCV: 92.1 fL (ref 80.0–100.0)
Monocytes Absolute: 1.7 10*3/uL — ABNORMAL HIGH (ref 0.1–1.0)
Monocytes Relative: 9 %
Neutro Abs: 15 10*3/uL — ABNORMAL HIGH (ref 1.7–7.7)
Neutrophils Relative %: 81 %
Platelet Count: 69 10*3/uL — ABNORMAL LOW (ref 150–400)
RBC: 3.65 MIL/uL — ABNORMAL LOW (ref 3.87–5.11)
RDW: 16.3 % — ABNORMAL HIGH (ref 11.5–15.5)
WBC Count: 18.6 10*3/uL — ABNORMAL HIGH (ref 4.0–10.5)
nRBC: 0 % (ref 0.0–0.2)

## 2022-10-13 LAB — CMP (CANCER CENTER ONLY)
ALT: 7 U/L (ref 0–44)
AST: 13 U/L — ABNORMAL LOW (ref 15–41)
Albumin: 3.5 g/dL (ref 3.5–5.0)
Alkaline Phosphatase: 123 U/L (ref 38–126)
Anion gap: 8 (ref 5–15)
BUN: 13 mg/dL (ref 8–23)
CO2: 29 mmol/L (ref 22–32)
Calcium: 9.4 mg/dL (ref 8.9–10.3)
Chloride: 99 mmol/L (ref 98–111)
Creatinine: 0.96 mg/dL (ref 0.44–1.00)
GFR, Estimated: >60 mL/min (ref >60)
Glucose, Bld: 118 mg/dL — ABNORMAL HIGH (ref 70–99)
Potassium: 4.3 mmol/L (ref 3.5–5.1)
Sodium: 136 mmol/L (ref 135–145)
Total Bilirubin: 0.5 mg/dL (ref 0.3–1.2)
Total Protein: 6.3 g/dL — ABNORMAL LOW (ref 6.5–8.1)

## 2022-10-13 LAB — SAMPLE TO BLOOD BANK

## 2022-10-13 NOTE — Transitions of Care (Post Inpatient/ED Visit) (Signed)
10/13/2022  Name: Sheena [name] MRN: [id number] DOB: 03/26/1945  Today's TOC FU Call Status: Today's TOC FU Call Status:: Successful TOC FU Call Completed TOC FU Call Complete Date: 10/13/22  Transition Care Management Follow-up Telephone Call Date of Discharge: 10/09/22 Discharge Facility: [name]) Type of Discharge: Inpatient Admission Primary Inpatient Discharge Diagnosis:: "symptomatic anemia" How have you been since you were released from the hospital?: Better (Pt states she is "getting better everyday." Denies any acute issues. No pain. Appetite good. BM today) Any questions or concerns?: No   Items Reviewed: Did you receive and understand the discharge instructions provided?: Yes Medications obtained,verified, and reconciled?: Yes (Medications Reviewed) Any new allergies since your discharge?: No Dietary orders reviewed?: Yes Type of Diet Ordered:: low salt/heart healthy Do you have support at home?: Yes People in Home: spouse Name of Support/Comfort Primary Source: Rick  Medications Reviewed Today: Medications Reviewed Today     Reviewed by Charlyn Minerva, RN (Registered Nurse) on 10/13/22 at 1200  Med List Status: <None>   Medication Order Taking? Sig Documenting Provider Last Dose Status Informant  acetaminophen (TYLENOL) 500 MG tablet [id number] Yes Take 1,000 mg by mouth 2 (two) times daily as needed for mild pain. [provider] Taking Active Self, Pharmacy Records  amiodarone (PACERONE) 200 MG tablet [id number] Yes Take 1 tablet (200 mg total) by mouth 2 (two) times daily for 5 days, THEN 1 tablet (200 mg total) daily. Follow up with cardiology outpatient for refills.  Patient taking differently: Take 1 tablet by mouth daily   Zigmund Daniel., MD Taking Active Self, Pharmacy Records  atorvastatin (LIPITOR) 20 MG tablet [id number] Yes TAKE 1 TABLET BY MOUTH EVERY DAY  Patient taking differently: Take 20 mg by mouth daily.    [name], MD Taking Active Self, Pharmacy Records  Calcium Citrate-Vitamin D (CALCIUM CITRATE PETITE/VIT D PO) [id number] Yes Take 1 tablet by mouth 2 (two) times daily. [provider] Taking Active Self, Pharmacy Records  famotidine (PEPCID) 20 MG tablet [id number] Yes Take 20 mg by mouth daily. [provider] Taking Active Self, Pharmacy Records  lidocaine-prilocaine (EMLA) cream [id number] Yes Apply 1 Application topically as needed.  Patient taking differently: Apply 1 Application topically as needed (port access).   Heilingoetter, Cassandra L, PA-C Taking Active Self, Pharmacy Records  magnesium oxide (MAG-OX) 400 (240 Mg) MG tablet [id number] Yes Take 1 tablet (400 mg total) by mouth 2 (two) times daily. Follow your magnesium level outpatient with your PCP Zigmund Daniel., MD Taking Active Self, Pharmacy Records  metoprolol tartrate (LOPRESSOR) 25 MG tablet [id number] Yes Take 0.5 tablets (12.5 mg total) by mouth 2 (two) times daily. Zigmund Daniel., MD Taking Active Self, Pharmacy Records  PRESCRIPTION MEDICATION [id number] Yes Apply 1 application  topically in the morning and at bedtime. Unknown cream given to Pt from the cancer center for after radiation [provider] Taking Active Self, Pharmacy Records  prochlorperazine (COMPAZINE) 10 MG tablet [id number] Yes Take 1 tablet (10 mg total) by mouth every 6 (six) hours as needed for nausea or vomiting. Heilingoetter, Cassandra L, PA-C Taking Active Self, Pharmacy Records  sucralfate (CARAFATE) 1 GM/10ML suspension [id number] Yes Take 10 mLs (1 g total) by mouth 4 (four) times daily -  with meals and at bedtime.  Patient taking differently: Take 1 g by mouth 4 (four) times daily.   Ronny Bacon, PA-C Taking Active Self, Pharmacy Records  traMADol (ULTRAM) 50 MG tablet  [id number] Yes Take 0.5-1 tablets (25-50 mg total) by mouth 2 (two) times daily as needed. For chronic low back pain. May  refill monthly  Patient taking differently: Take 25 mg by mouth daily as needed for moderate pain or severe pain.   [name], MD Taking Active Self, Pharmacy Records            Home Care and Equipment/Supplies: Were Home Health Services Ordered?: NA Any new equipment or medical supplies ordered?: NA  Functional Questionnaire: Do you need assistance with bathing/showering or dressing?: Yes Do you need assistance with meal preparation?: No Do you need assistance with eating?: No Do you have difficulty maintaining continence: No Do you need assistance with getting out of bed/getting out of a chair/moving?: No Do you have difficulty managing or taking your medications?: No  Follow up appointments reviewed: PCP Follow-up appointment confirmed?: No (offered to assist pt with making PCP appt during call-pt declined-seeing oncologist frequently-prefers to defer making an appt until things "calm down") MD Provider Line Number:[phone] Given: No Specialist Hospital Follow-up appointment confirmed?: Yes Date of Specialist follow-up appointment?: 10/14/22 Follow-Up Specialty Provider:: Dr. Hilty-cardiology, 10/22/22-Dr. Mohammed-oncologist Do you need transportation to your follow-up appointment?: No Do you understand care options if your condition(s) worsen?: Yes-patient verbalized understanding  SDOH Interventions Today    Flowsheet Row Most Recent Value  SDOH Interventions   Food Insecurity Interventions Intervention Not Indicated  Transportation Interventions Intervention Not Indicated      TOC Interventions Today    Flowsheet Row Most Recent Value  TOC Interventions   TOC Interventions Discussed/Reviewed TOC Interventions Discussed      Interventions Today    Flowsheet Row Most Recent Value  General Interventions   General Interventions Discussed/Reviewed General Interventions Discussed, Doctor Visits, Durable Medical Equipment (DME)  Doctor Visits  Discussed/Reviewed Doctor Visits Discussed, PCP, Specialist  Durable Medical Equipment (DME) Bed side commode  [pt instructed on how to obtain a BSC-per inpt TOC SW-pt does not qualify for DME via insurance]  Education Interventions   Education Provided Provided Education  Provided Verbal Education On Nutrition, When to see the doctor, Medication  Nutrition Interventions   Nutrition Discussed/Reviewed Nutrition Discussed  Pharmacy Interventions   Pharmacy Dicussed/Reviewed Pharmacy Topics Discussed, Medications and their functions  Safety Interventions   Safety Discussed/Reviewed Safety Discussed, Fall Risk, Home Safety  Home Safety Assistive Devices       [place], [place] [facility] Care Management Care Management Community Coordinator Direct Phone: [phone] Toll Free: [phone] Fax: [phone]

## 2022-10-13 NOTE — Telephone Encounter (Signed)
Lab results shared with husband.  CT scan chest - expected 08/16 . Sheena [name] said she can hardly get out of bed. I asked him to return my call. To talk about her ability to get on CT table.

## 2022-10-14 ENCOUNTER — Encounter: Payer: Self-pay | Admitting: Physician Assistant

## 2022-10-14 ENCOUNTER — Telehealth: Payer: Self-pay | Admitting: Medical Oncology

## 2022-10-14 ENCOUNTER — Encounter: Payer: Self-pay | Admitting: Internal Medicine

## 2022-10-14 ENCOUNTER — Ambulatory Visit: Payer: 59 | Attending: Physician Assistant | Admitting: Physician Assistant

## 2022-10-14 VITALS — BP 156/70 | HR 80 | Ht 60.0 in | Wt 94.0 lb

## 2022-10-14 DIAGNOSIS — E785 Hyperlipidemia, unspecified: Secondary | ICD-10-CM

## 2022-10-14 DIAGNOSIS — I480 Paroxysmal atrial fibrillation: Secondary | ICD-10-CM | POA: Diagnosis not present

## 2022-10-14 DIAGNOSIS — I350 Nonrheumatic aortic (valve) stenosis: Secondary | ICD-10-CM | POA: Diagnosis not present

## 2022-10-14 DIAGNOSIS — I10 Essential (primary) hypertension: Secondary | ICD-10-CM

## 2022-10-14 MED ORDER — AMIODARONE HCL 200 MG PO TABS
200.0000 mg | ORAL_TABLET | Freq: Every day | ORAL | 3 refills | Status: DC
Start: 2022-10-14 — End: 2023-07-06

## 2022-10-14 MED ORDER — MAGNESIUM CITRATE 125 MG PO CAPS: 2.0000 | ORAL_CAPSULE | Freq: Two times a day (BID) | ORAL | 3 refills | Status: DC

## 2022-10-14 MED ORDER — MAGNESIUM OXIDE -MG SUPPLEMENT 400 (240 MG) MG PO TABS: 400.0000 mg | ORAL_TABLET | Freq: Two times a day (BID) | ORAL | 0 refills | Status: DC

## 2022-10-14 MED ORDER — METOPROLOL TARTRATE 25 MG PO TABS: 12.5000 mg | ORAL_TABLET | Freq: Two times a day (BID) | ORAL | 3 refills | Status: DC

## 2022-10-14 MED ORDER — METOPROLOL TARTRATE 25 MG PO TABS
25.0000 mg | ORAL_TABLET | Freq: Two times a day (BID) | ORAL | 3 refills | Status: DC
Start: 2022-10-14 — End: 2023-03-31

## 2022-10-14 NOTE — Patient Instructions (Addendum)
Medication Instructions:  - Take magnesium citrate (200mg  or 250mg ) twice daily. This is available over the counter. Look for a soft-gel or capsule form. - Increase your metoprolol tartrate (Lopressor) to 1 tablet (25mg ) twice daily. - Continue amiodarone 1 tablet (200mg ) once daily.   *If you need a refill on your cardiac medications before your next appointment, please call your pharmacy*   Lab Work: - None ordered  Testing/Procedures: - None ordered  Follow-Up: At [facility], you and your health needs are our priority.  As part of our continuing mission to provide you with exceptional heart care, we have created designated Provider Care Teams.  These Care Teams include your primary Cardiologist (physician) and Advanced Practice Providers (APPs -  Physician Assistants and Nurse Practitioners) who all work together to provide you with the care you need, when you need it.  We recommend signing up for the patient portal called "MyChart".  Sign up information is provided on this After Visit Summary.  MyChart is used to connect with patients for Virtual Visits (Telemedicine).  Patients are able to view lab/test results, encounter notes, upcoming appointments, etc.  Non-urgent messages can be sent to your provider as well.   To learn more about what you can do with MyChart, go to [email or web address].    Your next appointment:   2 month(s)  Provider:   Chrystie Nose, MD     Other Instructions You have been referred to Dr. Lalla Brothers (Electrophysiologist) for consideration of a Watchman device and possible A-fib ablation

## 2022-10-14 NOTE — Telephone Encounter (Signed)
LVM on husbands phone that I was returning his call.

## 2022-10-15 ENCOUNTER — Other Ambulatory Visit: Payer: Self-pay

## 2022-10-16 ENCOUNTER — Inpatient Hospital Stay: Payer: 59

## 2022-10-16 ENCOUNTER — Telehealth: Payer: Self-pay

## 2022-10-16 NOTE — Telephone Encounter (Signed)
Spoke with the patient and her husband at length. Sheena [name] declined to schedule consult as she does not want any sort of implants. Gave her Structural Heart direct phone number should she change her mind. They were grateful for assistance.

## 2022-10-16 NOTE — Telephone Encounter (Signed)
Per Bettina Gavia, called to arrange Watchman consult with Dr. Lalla Brothers.  Left message to call back.

## 2022-10-19 ENCOUNTER — Inpatient Hospital Stay: Payer: 59

## 2022-10-19 ENCOUNTER — Ambulatory Visit (HOSPITAL_COMMUNITY)
Admission: RE | Admit: 2022-10-19 | Discharge: 2022-10-19 | Disposition: A | Discharge status: Home / Self Care | Payer: 59 | Source: Ambulatory Visit | Attending: Internal Medicine | Admitting: Internal Medicine

## 2022-10-19 ENCOUNTER — Other Ambulatory Visit: Payer: Self-pay

## 2022-10-19 DIAGNOSIS — J432 Centrilobular emphysema: Secondary | ICD-10-CM | POA: Diagnosis not present

## 2022-10-19 DIAGNOSIS — C3432 Malignant neoplasm of lower lobe, left bronchus or lung: Secondary | ICD-10-CM

## 2022-10-19 DIAGNOSIS — C3490 Malignant neoplasm of unspecified part of unspecified bronchus or lung: Secondary | ICD-10-CM | POA: Insufficient documentation

## 2022-10-19 DIAGNOSIS — J90 Pleural effusion, not elsewhere classified: Secondary | ICD-10-CM | POA: Diagnosis not present

## 2022-10-19 DIAGNOSIS — J479 Bronchiectasis, uncomplicated: Secondary | ICD-10-CM | POA: Diagnosis not present

## 2022-10-19 LAB — CMP (CANCER CENTER ONLY)
ALT: 8 U/L (ref 0–44)
AST: 16 U/L (ref 15–41)
Albumin: 3.5 g/dL (ref 3.5–5.0)
Alkaline Phosphatase: 96 U/L (ref 38–126)
Anion gap: 7 (ref 5–15)
BUN: 13 mg/dL (ref 8–23)
CO2: 30 mmol/L (ref 22–32)
Calcium: 9.1 mg/dL (ref 8.9–10.3)
Chloride: 101 mmol/L (ref 98–111)
Creatinine: 0.84 mg/dL (ref 0.44–1.00)
GFR, Estimated: >60 mL/min (ref >60)
Glucose, Bld: 107 mg/dL — ABNORMAL HIGH (ref 70–99)
Potassium: 4.7 mmol/L (ref 3.5–5.1)
Sodium: 138 mmol/L (ref 135–145)
Total Bilirubin: 0.5 mg/dL (ref 0.3–1.2)
Total Protein: 6.5 g/dL (ref 6.5–8.1)

## 2022-10-19 LAB — CBC WITH DIFFERENTIAL (CANCER CENTER ONLY)
Abs Immature Granulocytes: 0.04 10*3/uL (ref 0.00–0.07)
Basophils Absolute: 0.1 10*3/uL (ref 0.0–0.1)
Basophils Relative: 1 %
Eosinophils Absolute: 0 10*3/uL (ref 0.0–0.5)
Eosinophils Relative: 0 %
HCT: 33.2 % — ABNORMAL LOW (ref 36.0–46.0)
Hemoglobin: 10.8 g/dL — ABNORMAL LOW (ref 12.0–15.0)
Immature Granulocytes: 0 %
Lymphocytes Relative: 21 %
Lymphs Abs: 1.9 10*3/uL (ref 0.7–4.0)
MCH: 31.4 pg (ref 26.0–34.0)
MCHC: 32.5 g/dL (ref 30.0–36.0)
MCV: 96.5 fL (ref 80.0–100.0)
Monocytes Absolute: 1 10*3/uL (ref 0.1–1.0)
Monocytes Relative: 11 %
Neutro Abs: 6.1 10*3/uL (ref 1.7–7.7)
Neutrophils Relative %: 67 %
Platelet Count: 210 10*3/uL (ref 150–400)
RBC: 3.44 MIL/uL — ABNORMAL LOW (ref 3.87–5.11)
RDW: 18.3 % — ABNORMAL HIGH (ref 11.5–15.5)
WBC Count: 9.2 10*3/uL (ref 4.0–10.5)
nRBC: 0 % (ref 0.0–0.2)

## 2022-10-19 LAB — SAMPLE TO BLOOD BANK

## 2022-10-19 MED ORDER — IOHEXOL 300 MG/ML  SOLN
75.0000 mL | Freq: Once | INTRAMUSCULAR | Status: AC | PRN
  Administered 2022-10-19: 75 mL via INTRAVENOUS

## 2022-10-20 NOTE — Progress Notes (Unsigned)
[facility] OFFICE PROGRESS NOTE  [name], MD [address] [place] [address]  DIAGNOSIS: Limited stage (T2a, N2, M0) small cell lung cancer presented with left lower lobe lung mass in addition to left hilar and mediastinal lymphadenopathy diagnosed in April 2024.   PRIOR THERAPY: Systemic therapy with carboplatin for AUC of 5 on day 1 and etoposide 100 Mg/M2 on days 1, 2 and 3 every 3 weeks concurrent with radiation.  She is status post 4 cycles.  Last dose on 09/24/22  CURRENT THERAPY: Observation   INTERVAL HISTORY: Sheena [name] 77 y.o. female returns to clinic today for a follow-up visit accompanied by her son.  The patient was recently undergoing chemotherapy and radiation for her limited stage small cell lung cancer.  She completed all 4 cycles of treatment although she did struggle with significant pancytopenia requiring supportive care with platelet transfusion and blood transfusion.  She was last seen by Dr. Arbutus Ped on 09/22/2022.  In the interval since last being seen, her weekly labs revealed a significantly low platelet count at 7000 with associated hematuria and blood in her stool.  She presented to the emergency room and was being monitored from 8/7-8/9.  She also had elevated troponin secondary to demand ischemia but cardiology did not feel that she was a candidate for invasive evaluation.  Her hemoglobin was 6 and she also received a blood transfusion.   Radiation oncology talked to her to discuss PCI, which she declined.    Her husband called in the interval with significant fatigue and saying he was having a challenging time getting her out of bed.  She saw cardiology in the interval who is considering Watchman consultation with Dr. Lalla Brothers the patient and her husband declined and are not interested in any implants.   Since last being seen she is feeling like is getting a little bit stronger. Her fatigue is still present but improving. She  denies any fever, chills, or night sweats. She reports her appetite is "good now". She states her breathing is "a lot better than it was" when she was anemic. She denies significant cough unless she has phlegm. She is wondering what she can take for phlegm.  Denies any chest pain or hemoptysis.  She reports some bruising from IVs but denies any visible bleeding.  Denies any nausea, vomiting, diarrhea, or constipation.  Denies any headache or visual changes.  She recently had a restaging CT scan performed.  She is here today for evaluation to review her scan results and discuss the next steps in her care.   MEDICAL HISTORY: Past Medical History:  Diagnosis Date   Anxiety    Heart murmur    Hypertension    Primary small cell carcinoma of lower lobe of left lung (HCC)    Scarlet fever with other complications childhood   "had to learn to work again" has had leg weakness    ALLERGIES:  is allergic to antihistamines, diphenhydramine-type; aspirin; and evista [raloxifene].  MEDICATIONS:  Current Outpatient Medications  Medication Sig Dispense Refill   acetaminophen (TYLENOL) 500 MG tablet Take 1,000 mg by mouth 2 (two) times daily as needed for mild pain.     amiodarone (PACERONE) 200 MG tablet Take 1 tablet (200 mg total) by mouth daily. 90 tablet 3   atorvastatin (LIPITOR) 20 MG tablet TAKE 1 TABLET BY MOUTH EVERY DAY (Patient taking differently: Take 20 mg by mouth daily.) 90 tablet 3   Calcium Citrate-Vitamin D (CALCIUM CITRATE  PETITE/VIT D PO) Take 1 tablet by mouth 2 (two) times daily.     famotidine (PEPCID) 20 MG tablet Take 20 mg by mouth daily.     lidocaine-prilocaine (EMLA) cream Apply 1 Application topically as needed. (Patient taking differently: Apply 1 Application topically as needed (port access).) 30 g 2   Magnesium Citrate 125 MG CAPS Take 2 capsules by mouth 2 (two) times daily. 90 capsule 3   metoprolol tartrate (LOPRESSOR) 25 MG tablet Take 1 tablet (25 mg total) by mouth 2  (two) times daily. 180 tablet 3   PRESCRIPTION MEDICATION Apply 1 application  topically in the morning and at bedtime. Unknown cream given to Pt from the cancer center for after radiation     prochlorperazine (COMPAZINE) 10 MG tablet Take 1 tablet (10 mg total) by mouth every 6 (six) hours as needed for nausea or vomiting. 30 tablet 2   sucralfate (CARAFATE) 1 GM/10ML suspension Take 10 mLs (1 g total) by mouth 4 (four) times daily -  with meals and at bedtime. (Patient taking differently: Take 1 g by mouth 4 (four) times daily.) 420 mL 1   traMADol (ULTRAM) 50 MG tablet Take 0.5-1 tablets (25-50 mg total) by mouth 2 (two) times daily as needed. For chronic low back pain. May refill monthly (Patient taking differently: Take 25 mg by mouth daily as needed for moderate pain or severe pain.) 60 tablet 2   No current facility-administered medications for this visit.    SURGICAL HISTORY:  Past Surgical History:  Procedure Laterality Date   BRONCHIAL NEEDLE ASPIRATION BIOPSY  06/30/2022   Procedure: BRONCHIAL NEEDLE ASPIRATION BIOPSIES;  Surgeon: [name], DO;  Location: [facility];  Service: Pulmonary;;   IR IMAGING GUIDED PORT INSERTION  07/31/2022   KYPHOPLASTY     2014   peridontal surgery     VIDEO BRONCHOSCOPY WITH ENDOBRONCHIAL ULTRASOUND N/A 06/30/2022   Procedure: VIDEO BRONCHOSCOPY WITH ENDOBRONCHIAL ULTRASOUND;  Surgeon: [name], DO;  Location: [facility];  Service: Pulmonary;  Laterality: N/A;    REVIEW OF SYSTEMS:   Review of Systems  Constitutional: Improving fatigue.  Negative for appetite change, chills, fever and unexpected weight change.  HENT: Negative for mouth sores, nosebleeds, sore throat and trouble swallowing.   Eyes: Negative for eye problems and icterus.  Respiratory: Improving dyspnea on exertion.  Negative for cough, hemoptysis,  and wheezing.   Cardiovascular: Negative for chest pain and leg swelling.  Gastrointestinal: Negative for abdominal  pain, constipation, diarrhea, nausea and vomiting.  Genitourinary: Negative for bladder incontinence, difficulty urinating, dysuria, frequency and hematuria.   Musculoskeletal: Negative for back pain, gait problem, neck pain and neck stiffness.  Skin: Negative for itching and rash.  Neurological: Negative for dizziness, extremity weakness, gait problem, headaches, light-headedness and seizures.  Hematological: Negative for adenopathy. Does not bleed easily.  Positive for some easy bruising. Psychiatric/Behavioral: Negative for confusion, depression and sleep disturbance. The patient is not nervous/anxious.     PHYSICAL EXAMINATION:  There were no vitals taken for this visit.  ECOG PERFORMANCE STATUS: 2  Physical Exam  Constitutional: Oriented to person, place, and time and thin appearing female and in no distress.  HENT:  Head: Normocephalic and atraumatic.  Mouth/Throat: Oropharynx is clear and moist. No oropharyngeal exudate.  Eyes: Conjunctivae are normal. Right eye exhibits no discharge. Left eye exhibits no discharge. No scleral icterus.  Neck: Normal range of motion. Neck supple.  Cardiovascular: Normal rate, regular rhythm, normal heart sounds and intact  distal pulses.   Pulmonary/Chest: Effort normal and breath sounds normal. No respiratory distress. No wheezes. No rales.  Abdominal: Soft. Bowel sounds are normal. Exhibits no distension and no mass. There is no tenderness.  Musculoskeletal: Normal range of motion. Exhibits no edema.  Lymphadenopathy:    No cervical adenopathy.  Neurological: Alert and oriented to person, place, and time. Exhibits also wasting.  Examined in the wheelchair. Skin: A few small bruises on her upper extremities.  Skin is warm and dry. No rash noted. Not diaphoretic. No erythema. No pallor.  Psychiatric: Mood, memory and judgment normal.  Vitals reviewed.  LABORATORY DATA: Lab Results  Component Value Date   WBC 9.2 10/19/2022   HGB 10.8 (L)  10/19/2022   HCT 33.2 (L) 10/19/2022   MCV 96.5 10/19/2022   PLT 210 10/19/2022      Chemistry      Component Value Date/Time   NA 138 10/19/2022 1556   K 4.7 10/19/2022 1556   CL 101 10/19/2022 1556   CO2 30 10/19/2022 1556   BUN 13 10/19/2022 1556   CREATININE 0.84 10/19/2022 1556   CREATININE 1.18 (H) 02/19/2020 1048      Component Value Date/Time   CALCIUM 9.1 10/19/2022 1556   ALKPHOS 96 10/19/2022 1556   AST 16 10/19/2022 1556   ALT 8 10/19/2022 1556   BILITOT 0.5 10/19/2022 1556       RADIOGRAPHIC STUDIES:  CT Angio Chest PE W and/or Wo Contrast  Result Date: 10/07/2022 CLINICAL DATA:  Concern for pulmonary embolism.  Short of breath. EXAM: CT ANGIOGRAPHY CHEST WITH CONTRAST TECHNIQUE: Multidetector CT imaging of the chest was performed using the standard protocol during bolus administration of intravenous contrast. Multiplanar CT image reconstructions and MIPs were obtained to evaluate the vascular anatomy. RADIATION DOSE REDUCTION: This exam was performed according to the departmental dose-optimization program which includes automated exposure control, adjustment of the mA and/or kV according to patient size and/or use of iterative reconstruction technique. CONTRAST:  75mL OMNIPAQUE IOHEXOL 350 MG/ML SOLN COMPARISON:  None Available. FINDINGS: Cardiovascular: No filling defects within the pulmonary arteries to suggest acute pulmonary embolism. Coronary artery calcification and aortic atherosclerotic calcification. Mediastinum/Nodes: No axillary or supraclavicular adenopathy. No mediastinal or hilar adenopathy. No pericardial fluid. Esophagus normal. Lungs/Pleura: No pulmonary infarction. No pneumonia. No pleural fluid. No pneumothorax Upper lobe centrilobular emphysema. Upper Abdomen: Limited view of the liver, kidneys, pancreas are unremarkable. Normal adrenal glands. Musculoskeletal: No aggressive osseous lesion. Compression deformity in lower thoracic spine is chronic.  Augmentation lower thoracic spine. Chronic compression fracture in the upper thoracic spine additionally. No change from 08/20/2022. Review of the MIP images confirms the above findings. IMPRESSION: 1. No evidence acute pulmonary embolism. 2. No acute pulmonary findings. 3. Aortic Atherosclerosis (ICD10-I70.0) and Emphysema (ICD10-J43.9). Electronically Signed   By: Genevive Bi M.D.   On: 10/07/2022 18:42   DG Chest Port 1 View  Result Date: 10/07/2022 CLINICAL DATA:  Shortness of breath. EXAM: PORTABLE CHEST 1 VIEW COMPARISON:  Chest radiograph 09/07/2022. FINDINGS: Rotated patient. Right chest Port-A-Cath tip projects over the low SVC. No consolidation or pulmonary edema. No pleural effusion or pneumothorax. Prior T12 kyphoplasty. IMPRESSION: No evidence of acute cardiopulmonary disease. Electronically Signed   By: Orvan Falconer M.D.   On: 10/07/2022 17:13     ASSESSMENT/PLAN:  Is a very pleasant 77 year old Caucasian female diagnosed with limited stage (T2a, N2, M0) small cell lung cancer.  She presented with a left lower lobe lung mass in addition  to left hilar and mediastinal lymphadenopathy.  She was diagnosed in April 2024.  She completed a course of 4 cycles of chemotherapy with carboplatin for an AUC of 5 on day 1 and etoposide 100 mg/m on days 1, 2, and 3 IV every 3 weeks.  She also had concurrent radiation.   She had a challenging time with treatment with fatigue, and pancytopenia requiring transfusion support.  She is starting to recover and feel better.   The patient recently had a restaging CT scan performed.  The patient was seen with Dr. Arbutus Ped today.  Dr. Arbutus Ped had a lengthy discussion with the patient today about her current condition and recommended treatment options.  The scan showed no evidence of disease progression.  No recurrent left lower lobe nodules identified but mentioned some airway plugging.  Dr. Arbutus Ped recommends she continue on observation with a  restaging CT scan of the chest in 3 months.  Patient has a Port-A-Cath.  She cannot recall the last time it was flushed.  We will make her an appointment in 3 weeks for a flush appointment and then every 6 to 8 weeks thereon after.  Because the patient has declined PCI, we will arrange for surveillance brain MRI in 3 months.  Per Dr. Arbutus Ped we will arrange for subsequent brain MRIs every 6 months or so.  The patient was advised to call immediately if she has any concerning symptoms in the interval. The patient voices understanding of current disease status and treatment options and is in agreement with the current care plan. All questions were answered. The patient knows to call the clinic with any problems, questions or concerns. We can certainly see the patient much sooner if necessary.    No orders of the defined types were placed in this encounter.    Saketh Daubert L Ammanda Dobbins, PA-C 10/20/22  ADDENDUM: Hematology/Oncology Attending: I had face-to-face encounter with the patient today.  I reviewed her records, lab, scan and recommended her care plan.  This is a very pleasant 77 years old white female with limited stage (T2a, N2, M0) small cell lung cancer presented with left lower lobe lung mass in addition to left hilar and mediastinal lymphadenopathy diagnosed in April 2024 status post 4 cycles of systemic chemotherapy with carboplatin and etoposide concurrent with radiation last dose was given on September 24, 2022.  The patient tolerated her treatment well except for the chemotherapy pancytopenia requiring PRBCs and platelet transfusion at some point in addition to growth factors. She is feeling much better today and recovered from the recent pancytopenia. The patient had a CT scan of the chest performed recently.  I personally and independently reviewed the scan and discussed the result with the patient and her husband. Her scan showed no concerning findings for disease progression and she  has almost complete response. I recommended for the patient to continue on observation with repeat CT scan of the chest as well as MRI of the brain in 3 months since the patient declined to have prophylactic cranial irradiation. She was advised to call immediately if she has any other concerning symptoms in the interval. The total time spent in the appointment was 30 minutes. Disclaimer: This note was dictated with voice recognition software. Similar sounding words can inadvertently be transcribed and may be missed upon review. Lajuana Matte, MD

## 2022-10-22 ENCOUNTER — Other Ambulatory Visit: Payer: Self-pay | Admitting: Radiation Oncology

## 2022-10-22 ENCOUNTER — Inpatient Hospital Stay (HOSPITAL_BASED_OUTPATIENT_CLINIC_OR_DEPARTMENT_OTHER): Payer: 59 | Admitting: Physician Assistant

## 2022-10-22 ENCOUNTER — Ambulatory Visit: Payer: 59 | Admitting: Internal Medicine

## 2022-10-22 ENCOUNTER — Telehealth: Payer: Self-pay | Admitting: Radiation Oncology

## 2022-10-22 VITALS — BP 145/66 | HR 73 | Temp 97.7°F | Resp 13 | Wt 93.4 lb

## 2022-10-22 DIAGNOSIS — C3490 Malignant neoplasm of unspecified part of unspecified bronchus or lung: Secondary | ICD-10-CM

## 2022-10-22 DIAGNOSIS — C3432 Malignant neoplasm of lower lobe, left bronchus or lung: Secondary | ICD-10-CM | POA: Diagnosis not present

## 2022-10-22 DIAGNOSIS — Z79899 Other long term (current) drug therapy: Secondary | ICD-10-CM | POA: Diagnosis not present

## 2022-10-22 NOTE — Telephone Encounter (Signed)
I called the patient to discuss PCI. She is not interested in this treatment at this time. I encouraged her to follow up with her oncology team, and recommended she have routine brain imaging if she is not proceeding with radiation. She's in agreement and will see med onc later today.

## 2022-10-23 ENCOUNTER — Other Ambulatory Visit: Payer: Self-pay

## 2022-10-23 ENCOUNTER — Telehealth: Payer: Self-pay | Admitting: Family Medicine

## 2022-10-23 DIAGNOSIS — K219 Gastro-esophageal reflux disease without esophagitis: Secondary | ICD-10-CM | POA: Diagnosis not present

## 2022-10-23 DIAGNOSIS — C3412 Malignant neoplasm of upper lobe, left bronchus or lung: Secondary | ICD-10-CM | POA: Diagnosis not present

## 2022-10-23 DIAGNOSIS — D696 Thrombocytopenia, unspecified: Secondary | ICD-10-CM | POA: Diagnosis not present

## 2022-10-23 DIAGNOSIS — Z9181 History of falling: Secondary | ICD-10-CM | POA: Diagnosis not present

## 2022-10-23 DIAGNOSIS — E785 Hyperlipidemia, unspecified: Secondary | ICD-10-CM | POA: Diagnosis not present

## 2022-10-23 DIAGNOSIS — I10 Essential (primary) hypertension: Secondary | ICD-10-CM | POA: Diagnosis not present

## 2022-10-23 DIAGNOSIS — E876 Hypokalemia: Secondary | ICD-10-CM | POA: Diagnosis not present

## 2022-10-23 DIAGNOSIS — I2489 Other forms of acute ischemic heart disease: Secondary | ICD-10-CM | POA: Diagnosis not present

## 2022-10-23 DIAGNOSIS — M810 Age-related osteoporosis without current pathological fracture: Secondary | ICD-10-CM | POA: Diagnosis not present

## 2022-10-23 DIAGNOSIS — N179 Acute kidney failure, unspecified: Secondary | ICD-10-CM | POA: Diagnosis not present

## 2022-10-23 DIAGNOSIS — D630 Anemia in neoplastic disease: Secondary | ICD-10-CM | POA: Diagnosis not present

## 2022-10-23 NOTE — Telephone Encounter (Signed)
..  Home Health Verbal Orders  Agency:  [facility]  CallerKaroline Caldwell, PT [phone]  Requesting OT/ PT/ Skilled nursing/ Social Work/ Speech:  PT  Reason for Request:  Strengthening, balance, gait, and activity tolerance   Frequency: 1 X 1 weeks 2 X 4 weeks 1 X 4 weeks AND OT evaluation   HH needs F2F w/in last 30 days

## 2022-10-26 NOTE — Telephone Encounter (Signed)
Called and spoke with Angie and VO given.

## 2022-10-27 ENCOUNTER — Encounter: Payer: Self-pay | Admitting: Internal Medicine

## 2022-10-29 DIAGNOSIS — D649 Anemia, unspecified: Secondary | ICD-10-CM | POA: Diagnosis not present

## 2022-11-03 DIAGNOSIS — D696 Thrombocytopenia, unspecified: Secondary | ICD-10-CM | POA: Diagnosis not present

## 2022-11-03 DIAGNOSIS — M810 Age-related osteoporosis without current pathological fracture: Secondary | ICD-10-CM | POA: Diagnosis not present

## 2022-11-03 DIAGNOSIS — I2489 Other forms of acute ischemic heart disease: Secondary | ICD-10-CM | POA: Diagnosis not present

## 2022-11-03 DIAGNOSIS — C3412 Malignant neoplasm of upper lobe, left bronchus or lung: Secondary | ICD-10-CM | POA: Diagnosis not present

## 2022-11-03 DIAGNOSIS — E876 Hypokalemia: Secondary | ICD-10-CM | POA: Diagnosis not present

## 2022-11-03 DIAGNOSIS — N179 Acute kidney failure, unspecified: Secondary | ICD-10-CM | POA: Diagnosis not present

## 2022-11-03 DIAGNOSIS — Z9181 History of falling: Secondary | ICD-10-CM | POA: Diagnosis not present

## 2022-11-03 DIAGNOSIS — K219 Gastro-esophageal reflux disease without esophagitis: Secondary | ICD-10-CM | POA: Diagnosis not present

## 2022-11-03 DIAGNOSIS — I10 Essential (primary) hypertension: Secondary | ICD-10-CM | POA: Diagnosis not present

## 2022-11-03 DIAGNOSIS — E785 Hyperlipidemia, unspecified: Secondary | ICD-10-CM | POA: Diagnosis not present

## 2022-11-03 DIAGNOSIS — D630 Anemia in neoplastic disease: Secondary | ICD-10-CM | POA: Diagnosis not present

## 2022-11-05 ENCOUNTER — Encounter: Payer: Self-pay | Admitting: Internal Medicine

## 2022-11-06 DIAGNOSIS — M810 Age-related osteoporosis without current pathological fracture: Secondary | ICD-10-CM | POA: Diagnosis not present

## 2022-11-06 DIAGNOSIS — D630 Anemia in neoplastic disease: Secondary | ICD-10-CM | POA: Diagnosis not present

## 2022-11-06 DIAGNOSIS — D696 Thrombocytopenia, unspecified: Secondary | ICD-10-CM | POA: Diagnosis not present

## 2022-11-06 DIAGNOSIS — I2489 Other forms of acute ischemic heart disease: Secondary | ICD-10-CM | POA: Diagnosis not present

## 2022-11-06 DIAGNOSIS — I10 Essential (primary) hypertension: Secondary | ICD-10-CM | POA: Diagnosis not present

## 2022-11-06 DIAGNOSIS — E876 Hypokalemia: Secondary | ICD-10-CM | POA: Diagnosis not present

## 2022-11-06 DIAGNOSIS — C3412 Malignant neoplasm of upper lobe, left bronchus or lung: Secondary | ICD-10-CM | POA: Diagnosis not present

## 2022-11-06 DIAGNOSIS — K219 Gastro-esophageal reflux disease without esophagitis: Secondary | ICD-10-CM | POA: Diagnosis not present

## 2022-11-06 DIAGNOSIS — E785 Hyperlipidemia, unspecified: Secondary | ICD-10-CM | POA: Diagnosis not present

## 2022-11-06 DIAGNOSIS — N179 Acute kidney failure, unspecified: Secondary | ICD-10-CM | POA: Diagnosis not present

## 2022-11-06 DIAGNOSIS — Z9181 History of falling: Secondary | ICD-10-CM | POA: Diagnosis not present

## 2022-11-09 ENCOUNTER — Telehealth: Payer: Self-pay | Admitting: Family Medicine

## 2022-11-09 DIAGNOSIS — Z9181 History of falling: Secondary | ICD-10-CM | POA: Diagnosis not present

## 2022-11-09 DIAGNOSIS — E876 Hypokalemia: Secondary | ICD-10-CM | POA: Diagnosis not present

## 2022-11-09 DIAGNOSIS — I10 Essential (primary) hypertension: Secondary | ICD-10-CM | POA: Diagnosis not present

## 2022-11-09 DIAGNOSIS — E785 Hyperlipidemia, unspecified: Secondary | ICD-10-CM | POA: Diagnosis not present

## 2022-11-09 DIAGNOSIS — N179 Acute kidney failure, unspecified: Secondary | ICD-10-CM | POA: Diagnosis not present

## 2022-11-09 DIAGNOSIS — M810 Age-related osteoporosis without current pathological fracture: Secondary | ICD-10-CM | POA: Diagnosis not present

## 2022-11-09 DIAGNOSIS — K219 Gastro-esophageal reflux disease without esophagitis: Secondary | ICD-10-CM | POA: Diagnosis not present

## 2022-11-09 DIAGNOSIS — D696 Thrombocytopenia, unspecified: Secondary | ICD-10-CM | POA: Diagnosis not present

## 2022-11-09 DIAGNOSIS — I2489 Other forms of acute ischemic heart disease: Secondary | ICD-10-CM | POA: Diagnosis not present

## 2022-11-09 DIAGNOSIS — D630 Anemia in neoplastic disease: Secondary | ICD-10-CM | POA: Diagnosis not present

## 2022-11-09 DIAGNOSIS — C3412 Malignant neoplasm of upper lobe, left bronchus or lung: Secondary | ICD-10-CM | POA: Diagnosis not present

## 2022-11-09 NOTE — Telephone Encounter (Signed)
Home Health Verbal Orders  Agency:  [facility]  Caller: Malori  Contact and title Ph# [phone] - OT  Requesting OT:    Reason for Request:  Work on strength, endurance and ADL  Frequency:  1 x per week for 6 weeks  HH needs F2F w/in last 30 days

## 2022-11-09 NOTE — Telephone Encounter (Signed)
Called and spoke with Mallory and VO given.

## 2022-11-10 ENCOUNTER — Inpatient Hospital Stay: Payer: 59 | Attending: Internal Medicine

## 2022-11-10 DIAGNOSIS — Z87891 Personal history of nicotine dependence: Secondary | ICD-10-CM | POA: Insufficient documentation

## 2022-11-10 DIAGNOSIS — C3432 Malignant neoplasm of lower lobe, left bronchus or lung: Secondary | ICD-10-CM | POA: Insufficient documentation

## 2022-11-11 DIAGNOSIS — I2489 Other forms of acute ischemic heart disease: Secondary | ICD-10-CM | POA: Diagnosis not present

## 2022-11-11 DIAGNOSIS — E876 Hypokalemia: Secondary | ICD-10-CM | POA: Diagnosis not present

## 2022-11-11 DIAGNOSIS — C3412 Malignant neoplasm of upper lobe, left bronchus or lung: Secondary | ICD-10-CM | POA: Diagnosis not present

## 2022-11-11 DIAGNOSIS — K219 Gastro-esophageal reflux disease without esophagitis: Secondary | ICD-10-CM | POA: Diagnosis not present

## 2022-11-11 DIAGNOSIS — E785 Hyperlipidemia, unspecified: Secondary | ICD-10-CM | POA: Diagnosis not present

## 2022-11-11 DIAGNOSIS — I10 Essential (primary) hypertension: Secondary | ICD-10-CM | POA: Diagnosis not present

## 2022-11-11 DIAGNOSIS — Z9181 History of falling: Secondary | ICD-10-CM | POA: Diagnosis not present

## 2022-11-11 DIAGNOSIS — D696 Thrombocytopenia, unspecified: Secondary | ICD-10-CM | POA: Diagnosis not present

## 2022-11-11 DIAGNOSIS — N179 Acute kidney failure, unspecified: Secondary | ICD-10-CM | POA: Diagnosis not present

## 2022-11-11 DIAGNOSIS — M810 Age-related osteoporosis without current pathological fracture: Secondary | ICD-10-CM | POA: Diagnosis not present

## 2022-11-11 DIAGNOSIS — D630 Anemia in neoplastic disease: Secondary | ICD-10-CM | POA: Diagnosis not present

## 2022-11-16 DIAGNOSIS — K219 Gastro-esophageal reflux disease without esophagitis: Secondary | ICD-10-CM | POA: Diagnosis not present

## 2022-11-16 DIAGNOSIS — E785 Hyperlipidemia, unspecified: Secondary | ICD-10-CM | POA: Diagnosis not present

## 2022-11-16 DIAGNOSIS — Z9181 History of falling: Secondary | ICD-10-CM | POA: Diagnosis not present

## 2022-11-16 DIAGNOSIS — M810 Age-related osteoporosis without current pathological fracture: Secondary | ICD-10-CM | POA: Diagnosis not present

## 2022-11-16 DIAGNOSIS — D696 Thrombocytopenia, unspecified: Secondary | ICD-10-CM | POA: Diagnosis not present

## 2022-11-16 DIAGNOSIS — I2489 Other forms of acute ischemic heart disease: Secondary | ICD-10-CM | POA: Diagnosis not present

## 2022-11-16 DIAGNOSIS — C3412 Malignant neoplasm of upper lobe, left bronchus or lung: Secondary | ICD-10-CM | POA: Diagnosis not present

## 2022-11-16 DIAGNOSIS — D630 Anemia in neoplastic disease: Secondary | ICD-10-CM | POA: Diagnosis not present

## 2022-11-16 DIAGNOSIS — N179 Acute kidney failure, unspecified: Secondary | ICD-10-CM | POA: Diagnosis not present

## 2022-11-16 DIAGNOSIS — I10 Essential (primary) hypertension: Secondary | ICD-10-CM | POA: Diagnosis not present

## 2022-11-16 DIAGNOSIS — E876 Hypokalemia: Secondary | ICD-10-CM | POA: Diagnosis not present

## 2022-11-18 ENCOUNTER — Telehealth: Payer: Self-pay | Admitting: Family Medicine

## 2022-11-18 NOTE — Telephone Encounter (Signed)
Received faxed document Home Health Certificate (Order ID [id number]), to be filled out by provider. Patient requested to send it back via Fax within 5-days. Document is located in providers tray at front office.Please advise

## 2022-11-19 ENCOUNTER — Other Ambulatory Visit: Payer: Self-pay

## 2022-11-19 DIAGNOSIS — D649 Anemia, unspecified: Secondary | ICD-10-CM

## 2022-11-19 DIAGNOSIS — C3432 Malignant neoplasm of lower lobe, left bronchus or lung: Secondary | ICD-10-CM

## 2022-11-19 NOTE — Telephone Encounter (Signed)
Forms signed and faxed back.

## 2022-11-20 ENCOUNTER — Telehealth: Payer: Self-pay

## 2022-11-20 ENCOUNTER — Inpatient Hospital Stay: Payer: 59

## 2022-11-20 DIAGNOSIS — C3432 Malignant neoplasm of lower lobe, left bronchus or lung: Secondary | ICD-10-CM

## 2022-11-20 DIAGNOSIS — Z95828 Presence of other vascular implants and grafts: Secondary | ICD-10-CM

## 2022-11-20 DIAGNOSIS — Z87891 Personal history of nicotine dependence: Secondary | ICD-10-CM | POA: Diagnosis not present

## 2022-11-20 LAB — CBC WITH DIFFERENTIAL (CANCER CENTER ONLY)
Abs Immature Granulocytes: 0.15 10*3/uL — ABNORMAL HIGH (ref 0.00–0.07)
Basophils Absolute: 0.1 10*3/uL (ref 0.0–0.1)
Basophils Relative: 1 %
Eosinophils Absolute: 0.1 10*3/uL (ref 0.0–0.5)
Eosinophils Relative: 1 %
HCT: 31.8 % — ABNORMAL LOW (ref 36.0–46.0)
Hemoglobin: 10.5 g/dL — ABNORMAL LOW (ref 12.0–15.0)
Immature Granulocytes: 2 %
Lymphocytes Relative: 10 %
Lymphs Abs: 0.8 10*3/uL (ref 0.7–4.0)
MCH: 34.1 pg — ABNORMAL HIGH (ref 26.0–34.0)
MCHC: 33 g/dL (ref 30.0–36.0)
MCV: 103.2 fL — ABNORMAL HIGH (ref 80.0–100.0)
Monocytes Absolute: 0.9 10*3/uL (ref 0.1–1.0)
Monocytes Relative: 11 %
Neutro Abs: 5.8 10*3/uL (ref 1.7–7.7)
Neutrophils Relative %: 75 %
Platelet Count: 156 10*3/uL (ref 150–400)
RBC: 3.08 MIL/uL — ABNORMAL LOW (ref 3.87–5.11)
RDW: 19.1 % — ABNORMAL HIGH (ref 11.5–15.5)
WBC Count: 7.8 10*3/uL (ref 4.0–10.5)
nRBC: 0 % (ref 0.0–0.2)

## 2022-11-20 LAB — SAMPLE TO BLOOD BANK

## 2022-11-20 LAB — CMP (CANCER CENTER ONLY)
ALT: 6 U/L (ref 0–44)
AST: 15 U/L (ref 15–41)
Albumin: 3.7 g/dL (ref 3.5–5.0)
Alkaline Phosphatase: 84 U/L (ref 38–126)
Anion gap: 7 (ref 5–15)
BUN: 16 mg/dL (ref 8–23)
CO2: 30 mmol/L (ref 22–32)
Calcium: 9.5 mg/dL (ref 8.9–10.3)
Chloride: 100 mmol/L (ref 98–111)
Creatinine: 0.96 mg/dL (ref 0.44–1.00)
GFR, Estimated: >60 mL/min (ref >60)
Glucose, Bld: 116 mg/dL — ABNORMAL HIGH (ref 70–99)
Potassium: 4.2 mmol/L (ref 3.5–5.1)
Sodium: 137 mmol/L (ref 135–145)
Total Bilirubin: 0.4 mg/dL (ref 0.3–1.2)
Total Protein: 7.1 g/dL (ref 6.5–8.1)

## 2022-11-20 MED ORDER — SODIUM CHLORIDE 0.9% FLUSH
10.0000 mL | Freq: Once | INTRAVENOUS | Status: AC
  Administered 2022-11-20: 10 mL

## 2022-11-20 MED ORDER — HEPARIN SOD (PORK) LOCK FLUSH 100 UNIT/ML IV SOLN
500.0000 [IU] | Freq: Once | INTRAVENOUS | Status: AC
  Administered 2022-11-20: 500 [IU]

## 2022-11-20 NOTE — Telephone Encounter (Signed)
CRITICAL VALUE STICKER  CRITICAL VALUE: hg 10.5  RECEIVER :Sheena [name]. CMA  DATE & TIME NOTIFIED: 11/20/2022 1.33 pm  MESSENGER:Melissa  MD NOTIFIED: PA-C Cassie  TIME OF NOTIFICATION:1:38 pm  RESPONSE:

## 2022-11-20 NOTE — Patient Instructions (Signed)

## 2022-12-02 ENCOUNTER — Telehealth: Payer: Self-pay | Admitting: Radiation Oncology

## 2022-12-02 DIAGNOSIS — M810 Age-related osteoporosis without current pathological fracture: Secondary | ICD-10-CM | POA: Diagnosis not present

## 2022-12-02 DIAGNOSIS — D696 Thrombocytopenia, unspecified: Secondary | ICD-10-CM | POA: Diagnosis not present

## 2022-12-02 DIAGNOSIS — I10 Essential (primary) hypertension: Secondary | ICD-10-CM | POA: Diagnosis not present

## 2022-12-02 DIAGNOSIS — E785 Hyperlipidemia, unspecified: Secondary | ICD-10-CM | POA: Diagnosis not present

## 2022-12-02 DIAGNOSIS — D630 Anemia in neoplastic disease: Secondary | ICD-10-CM | POA: Diagnosis not present

## 2022-12-02 DIAGNOSIS — N179 Acute kidney failure, unspecified: Secondary | ICD-10-CM | POA: Diagnosis not present

## 2022-12-02 DIAGNOSIS — C3412 Malignant neoplasm of upper lobe, left bronchus or lung: Secondary | ICD-10-CM | POA: Diagnosis not present

## 2022-12-02 DIAGNOSIS — I2489 Other forms of acute ischemic heart disease: Secondary | ICD-10-CM | POA: Diagnosis not present

## 2022-12-02 DIAGNOSIS — K219 Gastro-esophageal reflux disease without esophagitis: Secondary | ICD-10-CM | POA: Diagnosis not present

## 2022-12-02 DIAGNOSIS — E876 Hypokalemia: Secondary | ICD-10-CM | POA: Diagnosis not present

## 2022-12-02 DIAGNOSIS — Z9181 History of falling: Secondary | ICD-10-CM | POA: Diagnosis not present

## 2022-12-02 NOTE — Telephone Encounter (Signed)
10/2 @ 4:38 pm Received voicemail from patient, she would like to reschedule her post treatment call appointment time for 10/7 to 11:00 am or later on same day.  Left voicemail for Darryl Nestle so they are aware.

## 2022-12-03 ENCOUNTER — Telehealth: Payer: Self-pay | Admitting: *Deleted

## 2022-12-03 DIAGNOSIS — C3412 Malignant neoplasm of upper lobe, left bronchus or lung: Secondary | ICD-10-CM | POA: Diagnosis not present

## 2022-12-03 DIAGNOSIS — D630 Anemia in neoplastic disease: Secondary | ICD-10-CM | POA: Diagnosis not present

## 2022-12-03 DIAGNOSIS — Z9181 History of falling: Secondary | ICD-10-CM | POA: Diagnosis not present

## 2022-12-03 DIAGNOSIS — N179 Acute kidney failure, unspecified: Secondary | ICD-10-CM | POA: Diagnosis not present

## 2022-12-03 DIAGNOSIS — K219 Gastro-esophageal reflux disease without esophagitis: Secondary | ICD-10-CM | POA: Diagnosis not present

## 2022-12-03 DIAGNOSIS — E876 Hypokalemia: Secondary | ICD-10-CM | POA: Diagnosis not present

## 2022-12-03 DIAGNOSIS — E785 Hyperlipidemia, unspecified: Secondary | ICD-10-CM | POA: Diagnosis not present

## 2022-12-03 DIAGNOSIS — I10 Essential (primary) hypertension: Secondary | ICD-10-CM | POA: Diagnosis not present

## 2022-12-03 DIAGNOSIS — D696 Thrombocytopenia, unspecified: Secondary | ICD-10-CM | POA: Diagnosis not present

## 2022-12-03 DIAGNOSIS — I2489 Other forms of acute ischemic heart disease: Secondary | ICD-10-CM | POA: Diagnosis not present

## 2022-12-03 DIAGNOSIS — M810 Age-related osteoporosis without current pathological fracture: Secondary | ICD-10-CM | POA: Diagnosis not present

## 2022-12-03 NOTE — Telephone Encounter (Signed)
RETURNED PATIENT'S PHONE CALL, LVM FOR A RETURN CALL 

## 2022-12-07 ENCOUNTER — Ambulatory Visit
Admission: RE | Admit: 2022-12-07 | Discharge: 2022-12-07 | Disposition: A | Discharge status: Home / Self Care | Payer: 59 | Source: Ambulatory Visit | Attending: Internal Medicine | Admitting: Internal Medicine

## 2022-12-07 NOTE — Progress Notes (Addendum)
Radiation Oncology         [phone]) [phone] ________________________________  Name: Sheena [name] MRN: [id number]  Date of Service: 12/07/2022  DOB: Apr 02, 1945  Post Treatment Telephone Note  Diagnosis:  Limited Stage Small Cell Carcinoma of the LLL. (as documented in provider EOT note)   The patient was not available for call today. Voicemail left.   The patient has scheduled follow up with her medical oncologist Dr. Arbutus Ped for ongoing care, and was encouraged to call if she develops concerns or questions regarding radiation.    Ruel Favors, LPN

## 2022-12-09 ENCOUNTER — Inpatient Hospital Stay (HOSPITAL_COMMUNITY)
Admission: EM | Admit: 2022-12-09 | Discharge: 2022-12-21 | DRG: 515 | Disposition: A | Discharge status: Skilled Nursing Facility | Payer: 59 | Attending: Obstetrics and Gynecology | Admitting: Obstetrics and Gynecology

## 2022-12-09 ENCOUNTER — Emergency Department (HOSPITAL_COMMUNITY): Payer: 59

## 2022-12-09 ENCOUNTER — Encounter (HOSPITAL_COMMUNITY): Payer: Self-pay

## 2022-12-09 ENCOUNTER — Other Ambulatory Visit: Payer: Self-pay

## 2022-12-09 DIAGNOSIS — K449 Diaphragmatic hernia without obstruction or gangrene: Secondary | ICD-10-CM | POA: Diagnosis not present

## 2022-12-09 DIAGNOSIS — Z886 Allergy status to analgesic agent status: Secondary | ICD-10-CM | POA: Diagnosis not present

## 2022-12-09 DIAGNOSIS — M545 Low back pain, unspecified: Secondary | ICD-10-CM | POA: Diagnosis not present

## 2022-12-09 DIAGNOSIS — J9601 Acute respiratory failure with hypoxia: Secondary | ICD-10-CM | POA: Diagnosis not present

## 2022-12-09 DIAGNOSIS — Z79899 Other long term (current) drug therapy: Secondary | ICD-10-CM

## 2022-12-09 DIAGNOSIS — I10 Essential (primary) hypertension: Secondary | ICD-10-CM | POA: Diagnosis not present

## 2022-12-09 DIAGNOSIS — Z8249 Family history of ischemic heart disease and other diseases of the circulatory system: Secondary | ICD-10-CM | POA: Diagnosis not present

## 2022-12-09 DIAGNOSIS — Z9221 Personal history of antineoplastic chemotherapy: Secondary | ICD-10-CM | POA: Diagnosis not present

## 2022-12-09 DIAGNOSIS — M549 Dorsalgia, unspecified: Secondary | ICD-10-CM | POA: Diagnosis not present

## 2022-12-09 DIAGNOSIS — M4856XA Collapsed vertebra, not elsewhere classified, lumbar region, initial encounter for fracture: Secondary | ICD-10-CM | POA: Diagnosis not present

## 2022-12-09 DIAGNOSIS — Z681 Body mass index (BMI) 19 or less, adult: Secondary | ICD-10-CM

## 2022-12-09 DIAGNOSIS — G9341 Metabolic encephalopathy: Secondary | ICD-10-CM | POA: Diagnosis not present

## 2022-12-09 DIAGNOSIS — Z825 Family history of asthma and other chronic lower respiratory diseases: Secondary | ICD-10-CM | POA: Diagnosis not present

## 2022-12-09 DIAGNOSIS — R64 Cachexia: Secondary | ICD-10-CM | POA: Diagnosis present

## 2022-12-09 DIAGNOSIS — S32000D Wedge compression fracture of unspecified lumbar vertebra, subsequent encounter for fracture with routine healing: Secondary | ICD-10-CM | POA: Diagnosis not present

## 2022-12-09 DIAGNOSIS — Z87891 Personal history of nicotine dependence: Secondary | ICD-10-CM | POA: Diagnosis not present

## 2022-12-09 DIAGNOSIS — R9431 Abnormal electrocardiogram [ECG] [EKG]: Secondary | ICD-10-CM | POA: Diagnosis not present

## 2022-12-09 DIAGNOSIS — R0902 Hypoxemia: Secondary | ICD-10-CM | POA: Diagnosis not present

## 2022-12-09 DIAGNOSIS — Z5982 Transportation insecurity: Secondary | ICD-10-CM | POA: Diagnosis not present

## 2022-12-09 DIAGNOSIS — F419 Anxiety disorder, unspecified: Secondary | ICD-10-CM | POA: Diagnosis present

## 2022-12-09 DIAGNOSIS — R6889 Other general symptoms and signs: Secondary | ICD-10-CM | POA: Diagnosis not present

## 2022-12-09 DIAGNOSIS — A419 Sepsis, unspecified organism: Secondary | ICD-10-CM | POA: Diagnosis not present

## 2022-12-09 DIAGNOSIS — Z1152 Encounter for screening for COVID-19: Secondary | ICD-10-CM | POA: Diagnosis not present

## 2022-12-09 DIAGNOSIS — F4321 Adjustment disorder with depressed mood: Secondary | ICD-10-CM | POA: Diagnosis not present

## 2022-12-09 DIAGNOSIS — Z923 Personal history of irradiation: Secondary | ICD-10-CM

## 2022-12-09 DIAGNOSIS — Z66 Do not resuscitate: Secondary | ICD-10-CM | POA: Diagnosis not present

## 2022-12-09 DIAGNOSIS — I700 Atherosclerosis of aorta: Secondary | ICD-10-CM | POA: Diagnosis not present

## 2022-12-09 DIAGNOSIS — N390 Urinary tract infection, site not specified: Secondary | ICD-10-CM | POA: Diagnosis not present

## 2022-12-09 DIAGNOSIS — E785 Hyperlipidemia, unspecified: Secondary | ICD-10-CM | POA: Diagnosis not present

## 2022-12-09 DIAGNOSIS — Z634 Disappearance and death of family member: Secondary | ICD-10-CM

## 2022-12-09 DIAGNOSIS — R54 Age-related physical debility: Secondary | ICD-10-CM | POA: Diagnosis present

## 2022-12-09 DIAGNOSIS — J439 Emphysema, unspecified: Secondary | ICD-10-CM | POA: Diagnosis not present

## 2022-12-09 DIAGNOSIS — S32009A Unspecified fracture of unspecified lumbar vertebra, initial encounter for closed fracture: Secondary | ICD-10-CM | POA: Diagnosis present

## 2022-12-09 DIAGNOSIS — R0602 Shortness of breath: Secondary | ICD-10-CM | POA: Diagnosis not present

## 2022-12-09 DIAGNOSIS — R509 Fever, unspecified: Secondary | ICD-10-CM | POA: Diagnosis not present

## 2022-12-09 DIAGNOSIS — Z888 Allergy status to other drugs, medicaments and biological substances status: Secondary | ICD-10-CM

## 2022-12-09 DIAGNOSIS — S32010A Wedge compression fracture of first lumbar vertebra, initial encounter for closed fracture: Secondary | ICD-10-CM

## 2022-12-09 DIAGNOSIS — Z85118 Personal history of other malignant neoplasm of bronchus and lung: Secondary | ICD-10-CM

## 2022-12-09 DIAGNOSIS — J4489 Other specified chronic obstructive pulmonary disease: Secondary | ICD-10-CM | POA: Diagnosis not present

## 2022-12-09 DIAGNOSIS — M810 Age-related osteoporosis without current pathological fracture: Secondary | ICD-10-CM | POA: Diagnosis not present

## 2022-12-09 DIAGNOSIS — S32019A Unspecified fracture of first lumbar vertebra, initial encounter for closed fracture: Secondary | ICD-10-CM | POA: Diagnosis not present

## 2022-12-09 DIAGNOSIS — Z743 Need for continuous supervision: Secondary | ICD-10-CM | POA: Diagnosis not present

## 2022-12-09 DIAGNOSIS — Z539 Procedure and treatment not carried out, unspecified reason: Secondary | ICD-10-CM | POA: Diagnosis not present

## 2022-12-09 DIAGNOSIS — G8929 Other chronic pain: Secondary | ICD-10-CM | POA: Diagnosis present

## 2022-12-09 DIAGNOSIS — I4821 Permanent atrial fibrillation: Secondary | ICD-10-CM | POA: Diagnosis present

## 2022-12-09 DIAGNOSIS — S32000A Wedge compression fracture of unspecified lumbar vertebra, initial encounter for closed fracture: Secondary | ICD-10-CM | POA: Diagnosis not present

## 2022-12-09 LAB — COMPREHENSIVE METABOLIC PANEL
ALT: 15 U/L (ref 0–44)
AST: 24 U/L (ref 15–41)
Albumin: 3.2 g/dL — ABNORMAL LOW (ref 3.5–5.0)
Alkaline Phosphatase: 77 U/L (ref 38–126)
Anion gap: 14 (ref 5–15)
BUN: 15 mg/dL (ref 8–23)
CO2: 25 mmol/L (ref 22–32)
Calcium: 10 mg/dL (ref 8.9–10.3)
Chloride: 95 mmol/L — ABNORMAL LOW (ref 98–111)
Creatinine, Ser: 0.98 mg/dL (ref 0.44–1.00)
GFR, Estimated: >60 mL/min (ref >60)
Glucose, Bld: 124 mg/dL — ABNORMAL HIGH (ref 70–99)
Potassium: 4.2 mmol/L (ref 3.5–5.1)
Sodium: 134 mmol/L — ABNORMAL LOW (ref 135–145)
Total Bilirubin: 1 mg/dL (ref 0.3–1.2)
Total Protein: 7 g/dL (ref 6.5–8.1)

## 2022-12-09 LAB — URINALYSIS, W/ REFLEX TO CULTURE (INFECTION SUSPECTED)
Bacteria, UA: NONE SEEN
Bilirubin Urine: NEGATIVE
Glucose, UA: NEGATIVE mg/dL
Hgb urine dipstick: NEGATIVE
Ketones, ur: 5 mg/dL — AB
Leukocytes,Ua: NEGATIVE
Nitrite: NEGATIVE
Protein, ur: NEGATIVE mg/dL
Specific Gravity, Urine: 1.017 (ref 1.005–1.030)
pH: 7 (ref 5.0–8.0)

## 2022-12-09 LAB — SARS CORONAVIRUS 2 BY RT PCR: SARS Coronavirus 2 by RT PCR: NEGATIVE

## 2022-12-09 LAB — TROPONIN I (HIGH SENSITIVITY): Troponin I (High Sensitivity): 11 ng/L (ref <18)

## 2022-12-09 LAB — CBC WITH DIFFERENTIAL/PLATELET
Abs Immature Granulocytes: 0.04 10*3/uL (ref 0.00–0.07)
Basophils Absolute: 0 10*3/uL (ref 0.0–0.1)
Basophils Relative: 0 %
Eosinophils Absolute: 0 10*3/uL (ref 0.0–0.5)
Eosinophils Relative: 0 %
HCT: 33.5 % — ABNORMAL LOW (ref 36.0–46.0)
Hemoglobin: 10.8 g/dL — ABNORMAL LOW (ref 12.0–15.0)
Immature Granulocytes: 0 %
Lymphocytes Relative: 6 %
Lymphs Abs: 0.6 10*3/uL — ABNORMAL LOW (ref 0.7–4.0)
MCH: 33.3 pg (ref 26.0–34.0)
MCHC: 32.2 g/dL (ref 30.0–36.0)
MCV: 103.4 fL — ABNORMAL HIGH (ref 80.0–100.0)
Monocytes Absolute: 0.7 10*3/uL (ref 0.1–1.0)
Monocytes Relative: 8 %
Neutro Abs: 8 10*3/uL — ABNORMAL HIGH (ref 1.7–7.7)
Neutrophils Relative %: 86 %
Platelets: 111 10*3/uL — ABNORMAL LOW (ref 150–400)
RBC: 3.24 MIL/uL — ABNORMAL LOW (ref 3.87–5.11)
RDW: 16.3 % — ABNORMAL HIGH (ref 11.5–15.5)
WBC: 9.4 10*3/uL (ref 4.0–10.5)
nRBC: 0 % (ref 0.0–0.2)

## 2022-12-09 LAB — TYPE AND SCREEN
ABO/RH(D): A NEG
Antibody Screen: NEGATIVE

## 2022-12-09 LAB — BRAIN NATRIURETIC PEPTIDE: B Natriuretic Peptide: 254.3 pg/mL — ABNORMAL HIGH (ref 0.0–100.0)

## 2022-12-09 MED ORDER — IOHEXOL 350 MG/ML SOLN
75.0000 mL | Freq: Once | INTRAVENOUS | Status: AC | PRN
  Administered 2022-12-09: 75 mL via INTRAVENOUS

## 2022-12-09 MED ORDER — MORPHINE SULFATE (PF) 2 MG/ML IV SOLN
1.0000 mg | Freq: Once | INTRAVENOUS | Status: AC
  Administered 2022-12-09: 1 mg via INTRAVENOUS
  Filled 2022-12-09: qty 1

## 2022-12-09 MED ORDER — MORPHINE SULFATE (PF) 4 MG/ML IV SOLN: 4.0000 mg | Freq: Once | INTRAVENOUS | Status: DC

## 2022-12-09 NOTE — ED Provider Notes (Signed)
[place] EMERGENCY DEPARTMENT AT [facility] Provider Note   CSN: [id number] Arrival date & time: 12/09/22  2058    History  Chief Complaint  Patient presents with   Back Pain    Sheena [name] is a 77 y.o. female  hx of anemia, small cell carcinoma left lower lobe here for evaluation of back pain.  She has pain located to her lower back.  Started this morning.  Took her home tramadol.  She also admits to generalized weakness, fatigue.  She has worsening cough which is productive of dark sputum. Has chronic cough at baseline however worsening. No fever.  Has generalized weakness.  States she last had chemo in Aug.  Had overall decreased p.o. intake, loose stool.  Denies any bloody stool, Nosebleed.  History of pancytopenia req transfusion.  No recent falls or injuries.  History of compression fracture.  No bowel or bladder incontinence, saddle paresthesia, lower extremity weakness. No pain, swelling to lower extremities. Urine this morning was "darker"  Per EMS, family states patient has been overall declining. Hypoxic for EMS 88% on RA, does not wear oxygen at baseline  Patient states she lives with her husband however husband is in the ICU at [facility]   HPI     Home Medications Prior to Admission medications   Medication Sig Start Date End Date Taking? Authorizing Provider  acetaminophen (TYLENOL) 500 MG tablet Take 1,000 mg by mouth 2 (two) times daily as needed for mild pain.    [provider]  amiodarone (PACERONE) 200 MG tablet Take 1 tablet (200 mg total) by mouth daily. 10/14/22   [facility]uke, Roe Rutherford, PA  atorvastatin (LIPITOR) 20 MG tablet TAKE 1 TABLET BY MOUTH EVERY DAY Patient taking differently: Take 20 mg by mouth daily. 05/25/22   [name], MD  Calcium Citrate-Vitamin D (CALCIUM CITRATE PETITE/VIT D PO) Take 1 tablet by mouth 2 (two) times daily.    [provider]  famotidine (PEPCID) 20 MG tablet Take 20 mg by mouth daily.    [provider]  lidocaine-prilocaine (EMLA) cream Apply 1 Application topically as needed. Patient taking differently: Apply 1 Application topically as needed (port access). 08/04/22   Heilingoetter, Cassandra L, PA-C  Magnesium Citrate 125 MG CAPS Take 2 capsules by mouth 2 (two) times daily. 10/14/22   [facility], Roe Rutherford, PA  metoprolol tartrate (LOPRESSOR) 25 MG tablet Take 1 tablet (25 mg total) by mouth 2 (two) times daily. 10/14/22 01/12/23  Marcelino Duster, PA  PRESCRIPTION MEDICATION Apply 1 application  topically in the morning and at bedtime. Unknown cream given to Pt from the cancer center for after radiation    [provider]  prochlorperazine (COMPAZINE) 10 MG tablet Take 1 tablet (10 mg total) by mouth every 6 (six) hours as needed for nausea or vomiting. 08/04/22   Heilingoetter, Cassandra L, PA-C  sucralfate (CARAFATE) 1 GM/10ML suspension Take 10 mLs (1 g total) by mouth 4 (four) times daily -  with meals and at bedtime. Patient taking differently: Take 1 g by mouth 4 (four) times daily. 08/26/22   Ronny Bacon, PA-C  traMADol (ULTRAM) 50 MG tablet Take 0.5-1 tablets (25-50 mg total) by mouth 2 (two) times daily as needed. For chronic low back pain. May refill monthly Patient taking differently: Take 25 mg by mouth daily as needed for moderate pain or severe pain. 05/07/22   [name], MD      Allergies    Antihistamines,  diphenhydramine-type; Aspirin; and Evista [raloxifene]    Review of Systems   Review of Systems  Constitutional:  Positive for activity change, appetite change and fatigue.  HENT: Negative.    Respiratory:  Positive for cough and shortness of breath. Negative for wheezing and stridor.   Cardiovascular: Negative.   Gastrointestinal:  Positive for diarrhea. Negative for abdominal distention, abdominal pain, anal bleeding, blood in stool, constipation, nausea, rectal pain and vomiting.  Genitourinary: Negative.   Musculoskeletal:   Positive for back pain.  Skin: Negative.   Neurological:  Positive for weakness. Negative for dizziness, tremors, seizures, syncope (generalized), facial asymmetry, speech difficulty, light-headedness, numbness and headaches.  All other systems reviewed and are negative.   Physical Exam Updated Vital Signs BP (!) 170/68 (BP Location: Right Arm)   Pulse 73   Temp 98.4 F (36.9 C) (Oral)   Resp 19   SpO2 100%  Physical Exam Vitals and nursing note reviewed.  Constitutional:      General: She is not in acute distress.    Appearance: She is well-developed. She is ill-appearing.     Comments: Thin, chronically ill-appearing  HENT:     Head: Normocephalic and atraumatic.     Nose: Nose normal.     Mouth/Throat:     Mouth: Mucous membranes are moist.  Eyes:     Pupils: Pupils are equal, round, and reactive to light.  Cardiovascular:     Rate and Rhythm: Normal rate.     Pulses: Normal pulses.          Radial pulses are 2+ on the right side and 2+ on the left side.       Dorsalis pedis pulses are 2+ on the right side and 2+ on the left side.     Heart sounds: Normal heart sounds.  Pulmonary:     Effort: Pulmonary effort is normal. No respiratory distress.     Breath sounds: Normal breath sounds.     Comments: Clear bilaterally, speaks in full sentences without difficulty. 84 % on RA Chest:     Comments: Port to RU chest wall Abdominal:     General: Bowel sounds are normal. There is no distension.     Palpations: Abdomen is soft.     Tenderness: There is abdominal tenderness in the suprapubic area. There is no right CVA tenderness, left CVA tenderness or guarding.     Comments: Soft, suprapubic tenderness  Musculoskeletal:        General: Normal range of motion.     Cervical back: Normal range of motion.     Comments: Moves all 4 extremities without difficulty.  Diffuse tenderness to lower back.  Lifts bilateral legs off bed without difficulty  Skin:    General: Skin is warm  and dry.     Coloration: Skin is pale.  Neurological:     General: No focal deficit present.     Mental Status: She is alert.     Cranial Nerves: Cranial nerves 2-12 are intact.     Sensory: Sensation is intact.     Motor: Motor function is intact.  Psychiatric:        Mood and Affect: Mood normal.     ED Results / Procedures / Treatments   Labs (all labs ordered are listed, but only abnormal results are displayed) Labs Reviewed  CBC WITH DIFFERENTIAL/PLATELET - Abnormal; Notable for the following components:      Result Value   RBC 3.24 (*)    Hemoglobin  10.8 (*)    HCT 33.5 (*)    MCV 103.4 (*)    RDW 16.3 (*)    Platelets 111 (*)    Neutro Abs 8.0 (*)    Lymphs Abs 0.6 (*)    All other components within normal limits  COMPREHENSIVE METABOLIC PANEL - Abnormal; Notable for the following components:   Sodium 134 (*)    Chloride 95 (*)    Glucose, Bld 124 (*)    Albumin 3.2 (*)    All other components within normal limits  URINALYSIS, W/ REFLEX TO CULTURE (INFECTION SUSPECTED) - Abnormal; Notable for the following components:   APPearance HAZY (*)    Ketones, ur 5 (*)    All other components within normal limits  BRAIN NATRIURETIC PEPTIDE - Abnormal; Notable for the following components:   B Natriuretic Peptide 254.3 (*)    All other components within normal limits  SARS CORONAVIRUS 2 BY RT PCR  TYPE AND SCREEN  TROPONIN I (HIGH SENSITIVITY)  TROPONIN I (HIGH SENSITIVITY)    EKG None  Radiology DG Chest Portable 1 View  Result Date: 12/09/2022 CLINICAL DATA:  Hypoxic.  Shortness of breath. EXAM: PORTABLE CHEST 1 VIEW COMPARISON:  Radiographs 09/07/2022 and CT chest 10/19/2022 FINDINGS: Rotated patient. Right chest wall Port-A-Cath tip in the mid SVC. Stable cardiomediastinal silhouette. Aortic atherosclerotic calcification. Hyperinflation and chronic bronchitic change. No focal consolidation, pleural effusion, or pneumothorax. Lower thoracic vertebroplasty.  IMPRESSION: No acute cardiopulmonary process.  Emphysema. Electronically Signed   By: Minerva Fester M.D.   On: 12/09/2022 23:11    Procedures .Critical Care  Performed by: Linwood Dibbles, PA-C Authorized by: Linwood Dibbles, PA-C   Critical care provider statement:    Critical care time (minutes):  35   Critical care was necessary to treat or prevent imminent or life-threatening deterioration of the following conditions:  Respiratory failure   Critical care was time spent personally by me on the following activities:  Development of treatment plan with patient or surrogate, discussions with consultants, evaluation of patient's response to treatment, examination of patient, ordering and review of laboratory studies, ordering and review of radiographic studies, ordering and performing treatments and interventions, pulse oximetry, re-evaluation of patient's condition and review of old charts     Medications Ordered in ED Medications  morphine (PF) 2 MG/ML injection 1 mg (1 mg Intravenous Given 12/09/22 2223)    ED Course/ Medical Decision Making/ A&P   77 year old history of anemia, small cell lung cancer here for evaluation lower back pain.  Atraumatic.  She is nonfocal neuroexam without deficits.  She denies any recent falls or injuries.  Denies numbness, weakness, bowel or bladder incontinence, saddle paresthesia.  Took tramadol early this morning.  Patient is unsure if her cancer is in remission, finished chemotherapy in August.  According to EMS family states patient has been overall declining.  She admits to some cough, diarrhea, generalized weakness and decreased p.o. intake.  States her urine earlier today was lighter than normal.  Arrival she is afebrile, appears chronically ill.  84% on room air.  Her heart and lungs are clear.  She denies any prior history of PE or DVT.  No clinical evidence of VTE on exam.  Patient placed on 4 L via nasal cannula.  Will plan on labs, imaging and  reassess  Nursing went to give patient 4 mg morphine. Patient states that she has had that dose before and it was "too much" Will give smaller dose  for her back pain.  Labs and imaging personally viewed and interpreted:  CBC without leukocytosis, hemoglobin 10.8 UA negative for infection Metabolic panel sodium 134 BNP 295 Troponin 11  Patient care transferred to [facility], PA-C who will FU on remaining labs, imaging. Will need to be admitted for hypoxia.                                 Medical Decision Making Amount and/or Complexity of Data Reviewed Independent Historian: EMS External Data Reviewed: labs, radiology, ECG and notes. Labs: ordered. Decision-making details documented in ED Course. Radiology: ordered and independent interpretation performed. Decision-making details documented in ED Course. ECG/medicine tests: ordered and independent interpretation performed. Decision-making details documented in ED Course.  Risk OTC drugs. Prescription drug management. Parenteral controlled substances. Decision regarding hospitalization. Diagnosis or treatment significantly limited by social determinants of health.          Final Clinical Impression(s) / ED Diagnoses Final diagnoses:  Acute respiratory failure with hypoxia (HCC)  History of lung cancer  Acute midline low back pain without sciatica    Rx / DC Orders ED Discharge Orders     None         Jamari Moten A, PA-C 12/09/22 2330    Lonell Grandchild, MD 12/11/22 1900

## 2022-12-09 NOTE — ED Provider Notes (Signed)
77yo female with complaint of back pain, hx small cell lung ca. O2 80s. Concern for mets. Also chronic cough with mild SHOB, no hemoptysis. New O2.  Pending Cts with plan to admit for new O2 requirement.  Pain controlled with 1mg  Morphine. Stopped chemo in August. Scheduled for MRI and repeat PET next month.  Physical Exam  BP (!) 170/68 (BP Location: Right Arm)   Pulse 73   Temp 98.4 F (36.9 C) (Oral)   Resp 19   SpO2 100%   Physical Exam  Procedures  Procedures  ED Course / MDM    Medical Decision Making Amount and/or Complexity of Data Reviewed Labs: ordered. Radiology: ordered.  Risk Prescription drug management. Decision regarding hospitalization.   CT scans negative for PE. Shows emphysema, acute to sub acute L1 compression fracture, multiple chronic compression fractures. Hospitalist paged for consult for admission for new O2 requirement.   Case discussed with Dr. Lazarus Salines with [facility] hospitalist service will consult for admission.       Jeannie Fend, PA-C 12/10/22 4098    Zadie Rhine, MD 12/10/22 [phone]

## 2022-12-09 NOTE — ED Triage Notes (Signed)
Pt to ED BIB EMS from home with complaint of severe lower back pain that began this morning. Last took tramadol around 12pm without improvement. Hx of lung CA - completed chemo in August. Family reports pt has been decompensating since. O2 sat 88% on RA upon EMS arrival to scene - does not wear O2 at baseline. Placed on 4L O2 via [place]. Bradycardia with ambulation per EMS.

## 2022-12-10 DIAGNOSIS — S32019A Unspecified fracture of first lumbar vertebra, initial encounter for closed fracture: Secondary | ICD-10-CM | POA: Diagnosis not present

## 2022-12-10 DIAGNOSIS — S32000D Wedge compression fracture of unspecified lumbar vertebra, subsequent encounter for fracture with routine healing: Secondary | ICD-10-CM

## 2022-12-10 DIAGNOSIS — S32009A Unspecified fracture of unspecified lumbar vertebra, initial encounter for closed fracture: Secondary | ICD-10-CM | POA: Diagnosis present

## 2022-12-10 LAB — TROPONIN I (HIGH SENSITIVITY): Troponin I (High Sensitivity): 13 ng/L (ref <18)

## 2022-12-10 LAB — TSH: TSH: 1.978 u[IU]/mL (ref 0.350–4.500)

## 2022-12-10 MED ORDER — AMIODARONE HCL 200 MG PO TABS
200.0000 mg | ORAL_TABLET | Freq: Every day | ORAL | Status: DC
  Administered 2022-12-10 – 2022-12-21 (×10): 200 mg via ORAL
  Filled 2022-12-10 (×10): qty 1

## 2022-12-10 MED ORDER — SENNOSIDES-DOCUSATE SODIUM 8.6-50 MG PO TABS
1.0000 | ORAL_TABLET | Freq: Every evening | ORAL | Status: DC | PRN
  Administered 2022-12-20: 1 via ORAL
  Filled 2022-12-10: qty 1

## 2022-12-10 MED ORDER — METOPROLOL TARTRATE 25 MG PO TABS
25.0000 mg | ORAL_TABLET | Freq: Two times a day (BID) | ORAL | Status: DC
  Administered 2022-12-10 – 2022-12-21 (×19): 25 mg via ORAL
  Filled 2022-12-10 (×23): qty 1

## 2022-12-10 MED ORDER — TIZANIDINE HCL 4 MG PO TABS
2.0000 mg | ORAL_TABLET | Freq: Every evening | ORAL | Status: DC | PRN
  Administered 2022-12-10 – 2022-12-20 (×8): 2 mg via ORAL
  Filled 2022-12-10 (×8): qty 1

## 2022-12-10 MED ORDER — METOPROLOL TARTRATE 5 MG/5ML IV SOLN: 5.0000 mg | INTRAVENOUS | Status: DC | PRN

## 2022-12-10 MED ORDER — ALBUTEROL SULFATE (2.5 MG/3ML) 0.083% IN NEBU: 2.5000 mg | INHALATION_SOLUTION | Freq: Four times a day (QID) | RESPIRATORY_TRACT | Status: DC | PRN

## 2022-12-10 MED ORDER — HYDROXYZINE HCL 25 MG PO TABS: 25.0000 mg | ORAL_TABLET | Freq: Three times a day (TID) | ORAL | Status: DC | PRN

## 2022-12-10 MED ORDER — HYDRALAZINE HCL 20 MG/ML IJ SOLN: 10.0000 mg | INTRAMUSCULAR | Status: DC | PRN

## 2022-12-10 MED ORDER — OXYCODONE HCL 5 MG PO TABS
5.0000 mg | ORAL_TABLET | ORAL | Status: DC | PRN
  Administered 2022-12-10 – 2022-12-16 (×17): 5 mg via ORAL
  Filled 2022-12-10 (×17): qty 1

## 2022-12-10 MED ORDER — ONDANSETRON HCL 4 MG/2ML IJ SOLN
4.0000 mg | Freq: Four times a day (QID) | INTRAMUSCULAR | Status: DC | PRN
  Administered 2022-12-19: 4 mg via INTRAVENOUS
  Filled 2022-12-10: qty 2

## 2022-12-10 MED ORDER — ENOXAPARIN SODIUM 40 MG/0.4ML IJ SOSY
40.0000 mg | PREFILLED_SYRINGE | INTRAMUSCULAR | Status: DC
  Administered 2022-12-10: 40 mg via SUBCUTANEOUS
  Filled 2022-12-10: qty 0.4

## 2022-12-10 MED ORDER — POLYETHYLENE GLYCOL 3350 17 G PO PACK: 17.0000 g | PACK | Freq: Every day | ORAL | Status: DC | PRN

## 2022-12-10 MED ORDER — DOCUSATE SODIUM 100 MG PO CAPS
100.0000 mg | ORAL_CAPSULE | Freq: Two times a day (BID) | ORAL | Status: DC
  Administered 2022-12-10 – 2022-12-21 (×19): 100 mg via ORAL
  Filled 2022-12-10 (×21): qty 1

## 2022-12-10 MED ORDER — IPRATROPIUM-ALBUTEROL 0.5-2.5 (3) MG/3ML IN SOLN
3.0000 mL | Freq: Four times a day (QID) | RESPIRATORY_TRACT | Status: DC
  Administered 2022-12-10 (×3): 3 mL via RESPIRATORY_TRACT
  Filled 2022-12-10 (×3): qty 3

## 2022-12-10 MED ORDER — OXYCODONE HCL 5 MG PO TABS
2.5000 mg | ORAL_TABLET | ORAL | Status: DC | PRN
  Filled 2022-12-10: qty 1

## 2022-12-10 MED ORDER — MORPHINE SULFATE (PF) 2 MG/ML IV SOLN
2.0000 mg | Freq: Once | INTRAVENOUS | Status: AC
  Administered 2022-12-10: 2 mg via INTRAVENOUS
  Filled 2022-12-10: qty 1

## 2022-12-10 MED ORDER — ENOXAPARIN SODIUM 30 MG/0.3ML IJ SOSY
30.0000 mg | PREFILLED_SYRINGE | INTRAMUSCULAR | Status: DC
  Administered 2022-12-11 – 2022-12-14 (×4): 30 mg via SUBCUTANEOUS
  Filled 2022-12-10 (×4): qty 0.3

## 2022-12-10 MED ORDER — ACETAMINOPHEN 500 MG PO TABS
1000.0000 mg | ORAL_TABLET | Freq: Four times a day (QID) | ORAL | Status: DC
  Administered 2022-12-10 – 2022-12-21 (×32): 1000 mg via ORAL
  Filled 2022-12-10 (×34): qty 2

## 2022-12-10 MED ORDER — ATORVASTATIN CALCIUM 10 MG PO TABS
20.0000 mg | ORAL_TABLET | Freq: Every day | ORAL | Status: DC
  Administered 2022-12-10 – 2022-12-21 (×10): 20 mg via ORAL
  Filled 2022-12-10 (×10): qty 2

## 2022-12-10 NOTE — Plan of Care (Signed)
  Problem: Clinical Measurements: Goal: Ability to maintain clinical measurements within normal limits will improve Outcome: Progressing Goal: Will remain free from infection Outcome: Progressing   Problem: Pain Managment: Goal: General experience of comfort will improve Outcome: Progressing   Problem: Safety: Goal: Ability to remain free from injury will improve Outcome: Progressing   Problem: Skin Integrity: Goal: Risk for impaired skin integrity will decrease Outcome: Progressing

## 2022-12-10 NOTE — ED Notes (Signed)
Spoke with pts daughter [name] and daughter reports that pt was high functioning until recently pts husband was admitted to ICU at [facility]. Pt was asked by this RN earlier today if she had tremors and the pt reports, "its my her nerves but didn't specify why."

## 2022-12-10 NOTE — Evaluation (Signed)
Physical Therapy Evaluation Patient Details Name: Sheena [name] MRN: [id number] DOB: 02-22-1946 Today's Date: 12/10/2022  History of Present Illness  77 yo female presents to [facility] for back pain after hearing a "pop". Pt sustained acute L1 compression fx. Additional workup for acute hypoxic respiratory failure. PMH includes small cell lung cancer status post chemoradiation, afib, hypertension, hyperlipidemia, osteoporosis with prior fragility fracture.  Clinical Impression   Pt presents with generalized weakness, severe back pain with mobility, poor activity tolerance. Pt to benefit from acute PT to address deficits. Pt requiring light assist for bed mobility, declines EOB or OOB given severe pain with mobility. PT encouraged pt to use log roll technique in bed for back protection and comfort. PT also encouraged LE exercise, repositioning every 2 hours, and mobility progression as tolerated with staff assist. Pt reports not wanting to d/c to SNF, but her husband whom she lives with is currently hospitalized and is currently having significant difficulty with mobility. Pt states her daughter and/or sisters may be able to stay with her at d/c. PT to progress mobility as tolerated, and will continue to follow acutely.          If plan is discharge home, recommend the following: A lot of help with walking and/or transfers;A lot of help with bathing/dressing/bathroom   Can travel by private vehicle   No    Equipment Recommendations Other (comment) (tbd)  Recommendations for Other Services       Functional Status Assessment Patient has had a recent decline in their functional status and demonstrates the ability to make significant improvements in function in a reasonable and predictable amount of time.     Precautions / Restrictions Precautions Precautions: Fall;Back Precaution Comments: reviewed log roll technique Restrictions Weight Bearing Restrictions: No      Mobility  Bed  Mobility Overal bed mobility: Needs Assistance Bed Mobility: Rolling, Supine to Sit, Sit to Supine Rolling: Min assist, Used rails   Supine to sit: Min assist, Used rails Sit to supine: Min assist, Used rails   General bed mobility comments: rolling L and R with truncal assist for completion of roll. 1/2 ROM pull to sit with trunk elevation/lowering assist. Heavy reliance on rails, very increased time to perform    Transfers                   General transfer comment: pt declined attempt given pain    Ambulation/Gait               General Gait Details: pt declined attempt given pain  Stairs            Wheelchair Mobility     Tilt Bed    Modified Rankin (Stroke Patients Only)       Balance Overall balance assessment: Needs assistance     Sitting balance - Comments: pt declines EOB       Standing balance comment: pt declines OOB                             Pertinent Vitals/Pain Pain Assessment Pain Assessment: 0-10 Pain Score: 5  Pain Location: back Pain Descriptors / Indicators: Sore, Discomfort Pain Intervention(s): Limited activity within patient's tolerance, Monitored during session, Repositioned    Home Living Family/patient expects to be discharged to:: Private residence Living Arrangements: Spouse/significant other Available Help at Discharge: Family;Available 24 hours/day Type of Home: House Home Access: Stairs to enter Entrance Stairs-Rails: Right Entrance Stairs-Number  of Steps: 3 Alternate Level Stairs-Number of Steps: 14 steps Home Layout: Two level;Bed/bath upstairs Home Equipment: Rolling Walker (2 wheels)      Prior Function Prior Level of Function : Needs assist             Mobility Comments: pt reports needing a cane, but does not use AD. ADLs Comments: pt reports independence with ADLs, pt's husband would help as needed but is in the hospital on "life support for cancer, it's not looking good"      Extremity/Trunk Assessment   Upper Extremity Assessment Upper Extremity Assessment: Defer to OT evaluation    Lower Extremity Assessment Lower Extremity Assessment: Generalized weakness    Cervical / Trunk Assessment Cervical / Trunk Assessment: Kyphotic  Communication   Communication Communication: No apparent difficulties Cueing Techniques: Verbal cues;Gestural cues  Cognition Arousal: Alert Behavior During Therapy: Anxious, Restless Overall Cognitive Status: No family/caregiver present to determine baseline cognitive functioning                                 General Comments: pt anxious, with UE trembling and self-limiting behaviors        General Comments General comments (skin integrity, edema, etc.): SpO2 93-94% on 2LO2, BP 130s/60s    Exercises General Exercises - Lower Extremity Ankle Circles/Pumps: AROM, Both, 5 reps, Supine Quad Sets: AROM, Both, 5 reps, Supine   Assessment/Plan    PT Assessment Patient needs continued PT services  PT Problem List Decreased strength;Decreased mobility;Decreased activity tolerance;Decreased balance;Decreased knowledge of use of DME;Pain;Cardiopulmonary status limiting activity;Decreased safety awareness       PT Treatment Interventions DME instruction;Therapeutic activities;Gait training;Therapeutic exercise;Patient/family education;Balance training;Stair training;Neuromuscular re-education;Functional mobility training    PT Goals (Current goals can be found in the Care Plan section)  Acute Rehab PT Goals PT Goal Formulation: With patient Time For Goal Achievement: 12/24/22 Potential to Achieve Goals: Good    Frequency Min 1X/week     Co-evaluation               AM-PAC PT "6 Clicks" Mobility  Outcome Measure Help needed turning from your back to your side while in a flat bed without using bedrails?: A Little Help needed moving from lying on your back to sitting on the side of a flat bed  without using bedrails?: A Lot Help needed moving to and from a bed to a chair (including a wheelchair)?: Total Help needed standing up from a chair using your arms (e.g., wheelchair or bedside chair)?: Total Help needed to walk in hospital room?: Total Help needed climbing 3-5 steps with a railing? : Total 6 Click Score: 9    End of Session   Activity Tolerance: Patient tolerated treatment well Patient left: in bed;with call bell/phone within reach;Other (comment) (on ED stretcher) Nurse Communication: Mobility status PT Visit Diagnosis: Other abnormalities of gait and mobility (R26.89);Muscle weakness (generalized) (M62.81)    Time: 4166-0630 PT Time Calculation (min) (ACUTE ONLY): 20 min   Charges:   PT Evaluation $PT Eval Low Complexity: 1 Low   PT General Charges $$ ACUTE PT VISIT: 1 Visit         Marye Round, PT DPT Acute Rehabilitation Services Secure Chat Preferred  Office [phone]   Callaghan Laverdure E Christain [place] 12/10/2022, 10:41 AM
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
## 2022-12-10 NOTE — Plan of Care (Signed)
  Problem: Health Behavior/Discharge Planning: Goal: Ability to manage health-related needs will improve Outcome: Progressing   Problem: Clinical Measurements: Goal: Cardiovascular complication will be avoided Outcome: Progressing   Problem: Elimination: Goal: Will not experience complications related to urinary retention Outcome: Progressing   Problem: Pain Managment: Goal: General experience of comfort will improve Outcome: Progressing

## 2022-12-10 NOTE — H&P (Signed)
History and Physical    Sheena [name] ZOX:[id number] DOB: [birth year] DOA: 12/09/2022  PCP: [name], MD   Patient coming from: Home   Chief Complaint:  Chief Complaint  Patient presents with   Back Pain    HPI:  Sheena [name] is a 77 y.o. female with hx of small cell lung cancer status post chemoradiation, atrial fibrillation, hypertension, hyperlipidemia, osteoporosis with prior fragility fracture, who presents due to worsening lower back pain.  Reports yesterday she was reaching over the table to pick up something and she felt a pop in her mid lower back.  Followed by immediate pain.  Due to the pain she has been unable to get around like she normally could.  Other than this has had no respiratory symptoms, no fevers no sick contacts, no chest pain.  Was on oxygen during a previous hospitalization but is not on chronic home oxygen.   Review of Systems:  ROS complete and negative except as marked above   Allergies  Allergen Reactions   Antihistamines, Diphenhydramine-Type Other (See Comments)    palpitations, feel jittery   Aspirin Other (See Comments)    Stomach cramps   Evista [Raloxifene] Other (See Comments)    Hot Flashes     Prior to Admission medications   Medication Sig Start Date End Date Taking? Authorizing Provider  acetaminophen (TYLENOL) 500 MG tablet Take 1,000 mg by mouth 2 (two) times daily as needed for mild pain.   Yes [provider]  amiodarone (PACERONE) 200 MG tablet Take 1 tablet (200 mg total) by mouth daily. 10/14/22  Yes Duke, Roe Rutherford, PA  atorvastatin (LIPITOR) 20 MG tablet TAKE 1 TABLET BY MOUTH EVERY DAY Patient taking differently: Take 20 mg by mouth daily. 05/25/22  Yes [name], MD  Calcium Citrate-Vitamin D (CALCIUM CITRATE PETITE/VIT D PO) Take 1 tablet by mouth 2 (two) times daily.   Yes [provider]  famotidine (PEPCID) 20 MG tablet Take 20 mg by mouth daily as needed for heartburn.   Yes  [provider]  lidocaine-prilocaine (EMLA) cream Apply 1 Application topically as needed. Patient taking differently: Apply 1 Application topically as needed (port access). 08/04/22  Yes Heilingoetter, Cassandra L, PA-C  Magnesium Citrate 125 MG CAPS Take 2 capsules by mouth 2 (two) times daily. Patient taking differently: Take 125 mg by mouth 2 (two) times daily. 10/14/22  Yes Duke, Roe Rutherford, PA  metoprolol tartrate (LOPRESSOR) 25 MG tablet Take 1 tablet (25 mg total) by mouth 2 (two) times daily. 10/14/22 01/12/23 Yes Duke, Roe Rutherford, PA  PRESCRIPTION MEDICATION Apply 1 application  topically in the morning and at bedtime. Unknown cream given to Pt from the cancer center for after radiation   Yes [provider]  prochlorperazine (COMPAZINE) 10 MG tablet Take 1 tablet (10 mg total) by mouth every 6 (six) hours as needed for nausea or vomiting. 08/04/22  Yes Heilingoetter, Cassandra L, PA-C  traMADol (ULTRAM) 50 MG tablet Take 0.5-1 tablets (25-50 mg total) by mouth 2 (two) times daily as needed. For chronic low back pain. May refill monthly Patient taking differently: Take 25 mg by mouth daily as needed for moderate pain or severe pain. 05/07/22  Yes [name], MD  sucralfate (CARAFATE) 1 GM/10ML suspension Take 10 mLs (1 g total) by mouth 4 (four) times daily -  with meals and at bedtime. Patient not taking: Reported on 12/10/2022 08/26/22   Ronny Bacon, PA-C    Past Medical  History:  Diagnosis Date   Anxiety    Heart murmur    Hypertension    Primary small cell carcinoma of lower lobe of left lung (HCC)    Scarlet fever with other complications childhood   "had to learn to work again" has had leg weakness    Past Surgical History:  Procedure Laterality Date   BRONCHIAL NEEDLE ASPIRATION BIOPSY  06/30/2022   Procedure: BRONCHIAL NEEDLE ASPIRATION BIOPSIES;  Surgeon: [name], DO;  Location: [facility];  Service: Pulmonary;;   IR IMAGING  GUIDED PORT INSERTION  07/31/2022   KYPHOPLASTY     2014   peridontal surgery     VIDEO BRONCHOSCOPY WITH ENDOBRONCHIAL ULTRASOUND N/A 06/30/2022   Procedure: VIDEO BRONCHOSCOPY WITH ENDOBRONCHIAL ULTRASOUND;  Surgeon: [name], DO;  Location: [facility];  Service: Pulmonary;  Laterality: N/A;     reports that she quit smoking about 5 years ago. Her smoking use included cigarettes. She started smoking about 50 years ago. She has a 45 pack-year smoking history. She has never used smokeless tobacco. She reports that she does not drink alcohol and does not use drugs.  Family History  Problem Relation Age of Onset   Cancer Mother        breast, spine mets   Hypertension Mother    Heart disease Mother 24       CABG 4 vessel   COPD Father        emphysema   Heart disease Brother 27       MI     Physical Exam: Vitals:   12/09/22 2113 12/09/22 2200 12/10/22 0100 12/10/22 0300  BP:  (!) 170/68 (!) 155/67   Pulse:  73 80   Resp:  19 19   Temp: 98.4 F (36.9 C)  97.9 F (36.6 C)   TempSrc: Oral  Oral   SpO2: 95% 100% 99%   Weight:    44.5 kg    Gen: Awake, alert, chronically ill-appearing CV: Regular, normal S1, S2, 2/6 diastolic decrescendo murmur at the left midsternal border Resp: Normal WOB, CTAB anteriorly, no wheezes Abd: Flat, normoactive, nontender MSK: Due to pain unable to roll to examine her back.  No lower extremity edema Skin: No rashes or lesions to exposed skin  Neuro: Alert and interactive  Psych: euthymic, appropriate    Data review:   Labs reviewed, notable for:   BNP 254 High-sensitivity Trop 11, 13 WBC 9 hemoglobin 10, platelet 111  Micro:  Results for orders placed or performed during the hospital encounter of 12/09/22  SARS Coronavirus 2 by RT PCR (hospital order, performed in [facility] hospital lab) *cepheid single result test* Anterior Nasal Swab     Status: None   Collection Time: 12/09/22  9:52 PM   Specimen: Anterior Nasal Swab   Result Value Ref Range Status   SARS Coronavirus 2 by RT PCR NEGATIVE NEGATIVE Final    Comment: Performed at [facility], [address] [place], [place] [address]    Imaging reviewed:  CT Angio Chest PE W and/or Wo Contrast  Result Date: 12/10/2022 CLINICAL DATA:  Low back pain, diarrhea, hypoxia, shortness of breath. PE suspected. Diarrhea. Increased fracture risk. EXAM: CT ANGIOGRAPHY CHEST CT ABDOMEN AND PELVIS WITH CONTRAST TECHNIQUE: Multidetector CT imaging of the chest was performed using the standard protocol during bolus administration of intravenous contrast. Multiplanar CT image reconstructions and MIPs were obtained to evaluate the vascular anatomy. Multidetector CT imaging of the abdomen and  pelvis was performed using the standard protocol during bolus administration of intravenous contrast. RADIATION DOSE REDUCTION: This exam was performed according to the departmental dose-optimization program which includes automated exposure control, adjustment of the mA and/or kV according to patient size and/or use of iterative reconstruction technique. CONTRAST:  75mL OMNIPAQUE IOHEXOL 350 MG/ML SOLN COMPARISON:  Chest radiograph 12/09/2022; CT chest 10/19/2022; PET/CT 06/12/2022; MRI lumbar spine 01/04/2013 FINDINGS: CTA CHEST FINDINGS Cardiovascular: Negative for acute pulmonary embolism. No pericardial effusion. Coronary artery and aortic atherosclerotic calcification. Mediastinum/Nodes: Trachea and esophagus are unremarkable. No thoracic adenopathy. Lungs/Pleura: Advanced emphysema. Bibasilar atelectasis/scarring. No focal consolidation, pleural effusion, or pneumothorax. Musculoskeletal: No acute fracture. Chronic compression fractures of T5, T8, T11, and T12. T12 vertebroplasty. Review of the MIP images confirms the above findings. CT ABDOMEN and PELVIS FINDINGS Hepatobiliary: No acute abnormality. Pancreas: Unremarkable. Spleen: Unremarkable. Adrenals/Urinary Tract: Normal adrenal  glands. No urinary calculi or hydronephrosis. Unremarkable bladder. Stomach/Bowel: Normal caliber large and small bowel. No bowel wall thickening. Small hiatal hernia. Descending duodenum lipoma. Vascular/Lymphatic: Aortic atherosclerosis. No enlarged abdominal or pelvic lymph nodes. Reproductive: Uterus and bilateral adnexa are unremarkable. Other: No free intraperitoneal fluid or air. Musculoskeletal: No acute fracture. Review of the MIP images confirms the above findings. CT LUMBAR SPINE Segmentation: 5 lumbar type vertebrae. Alignment: No evidence of traumatic malalignment. Vertebrae: New acute angulation of the anterior cortex of L1 with sclerosis through the mid vertebral body. Findings are compatible with acute or subacute fracture. Chronic compression fracture of L2 and L5. 2 mm of retropulsion of the mid L1 vertebral body. Paraspinal and other soft tissues: See above. Disc levels: Age-related degenerative spondylosis. No severe spinal canal or neural foraminal narrowing. IMPRESSION: 1. Negative for acute pulmonary embolism. 2. Advanced emphysema. 3. Acute-subacute compression fracture of L1 with 2 mm of retropulsion. 4. No acute abnormality in the abdomen or pelvis. Electronically Signed   By: Minerva Fester M.D.   On: 12/10/2022 00:55   CT ABDOMEN PELVIS W CONTRAST  Result Date: 12/10/2022 CLINICAL DATA:  Low back pain, diarrhea, hypoxia, shortness of breath. PE suspected. Diarrhea. Increased fracture risk. EXAM: CT ANGIOGRAPHY CHEST CT ABDOMEN AND PELVIS WITH CONTRAST TECHNIQUE: Multidetector CT imaging of the chest was performed using the standard protocol during bolus administration of intravenous contrast. Multiplanar CT image reconstructions and MIPs were obtained to evaluate the vascular anatomy. Multidetector CT imaging of the abdomen and pelvis was performed using the standard protocol during bolus administration of intravenous contrast. RADIATION DOSE REDUCTION: This exam was performed  according to the departmental dose-optimization program which includes automated exposure control, adjustment of the mA and/or kV according to patient size and/or use of iterative reconstruction technique. CONTRAST:  75mL OMNIPAQUE IOHEXOL 350 MG/ML SOLN COMPARISON:  Chest radiograph 12/09/2022; CT chest 10/19/2022; PET/CT 06/12/2022; MRI lumbar spine 01/04/2013 FINDINGS: CTA CHEST FINDINGS Cardiovascular: Negative for acute pulmonary embolism. No pericardial effusion. Coronary artery and aortic atherosclerotic calcification. Mediastinum/Nodes: Trachea and esophagus are unremarkable. No thoracic adenopathy. Lungs/Pleura: Advanced emphysema. Bibasilar atelectasis/scarring. No focal consolidation, pleural effusion, or pneumothorax. Musculoskeletal: No acute fracture. Chronic compression fractures of T5, T8, T11, and T12. T12 vertebroplasty. Review of the MIP images confirms the above findings. CT ABDOMEN and PELVIS FINDINGS Hepatobiliary: No acute abnormality. Pancreas: Unremarkable. Spleen: Unremarkable. Adrenals/Urinary Tract: Normal adrenal glands. No urinary calculi or hydronephrosis. Unremarkable bladder. Stomach/Bowel: Normal caliber large and small bowel. No bowel wall thickening. Small hiatal hernia. Descending duodenum lipoma. Vascular/Lymphatic: Aortic atherosclerosis. No enlarged abdominal or pelvic lymph nodes. Reproductive: Uterus and bilateral  adnexa are unremarkable. Other: No free intraperitoneal fluid or air. Musculoskeletal: No acute fracture. Review of the MIP images confirms the above findings. CT LUMBAR SPINE Segmentation: 5 lumbar type vertebrae. Alignment: No evidence of traumatic malalignment. Vertebrae: New acute angulation of the anterior cortex of L1 with sclerosis through the mid vertebral body. Findings are compatible with acute or subacute fracture. Chronic compression fracture of L2 and L5. 2 mm of retropulsion of the mid L1 vertebral body. Paraspinal and other soft tissues: See above.  Disc levels: Age-related degenerative spondylosis. No severe spinal canal or neural foraminal narrowing. IMPRESSION: 1. Negative for acute pulmonary embolism. 2. Advanced emphysema. 3. Acute-subacute compression fracture of L1 with 2 mm of retropulsion. 4. No acute abnormality in the abdomen or pelvis. Electronically Signed   By: Minerva Fester M.D.   On: 12/10/2022 00:55   CT L-SPINE NO CHARGE  Result Date: 12/10/2022 CLINICAL DATA:  Low back pain, diarrhea, hypoxia, shortness of breath. PE suspected. Diarrhea. Increased fracture risk. EXAM: CT ANGIOGRAPHY CHEST CT ABDOMEN AND PELVIS WITH CONTRAST TECHNIQUE: Multidetector CT imaging of the chest was performed using the standard protocol during bolus administration of intravenous contrast. Multiplanar CT image reconstructions and MIPs were obtained to evaluate the vascular anatomy. Multidetector CT imaging of the abdomen and pelvis was performed using the standard protocol during bolus administration of intravenous contrast. RADIATION DOSE REDUCTION: This exam was performed according to the departmental dose-optimization program which includes automated exposure control, adjustment of the mA and/or kV according to patient size and/or use of iterative reconstruction technique. CONTRAST:  75mL OMNIPAQUE IOHEXOL 350 MG/ML SOLN COMPARISON:  Chest radiograph 12/09/2022; CT chest 10/19/2022; PET/CT 06/12/2022; MRI lumbar spine 01/04/2013 FINDINGS: CTA CHEST FINDINGS Cardiovascular: Negative for acute pulmonary embolism. No pericardial effusion. Coronary artery and aortic atherosclerotic calcification. Mediastinum/Nodes: Trachea and esophagus are unremarkable. No thoracic adenopathy. Lungs/Pleura: Advanced emphysema. Bibasilar atelectasis/scarring. No focal consolidation, pleural effusion, or pneumothorax. Musculoskeletal: No acute fracture. Chronic compression fractures of T5, T8, T11, and T12. T12 vertebroplasty. Review of the MIP images confirms the above  findings. CT ABDOMEN and PELVIS FINDINGS Hepatobiliary: No acute abnormality. Pancreas: Unremarkable. Spleen: Unremarkable. Adrenals/Urinary Tract: Normal adrenal glands. No urinary calculi or hydronephrosis. Unremarkable bladder. Stomach/Bowel: Normal caliber large and small bowel. No bowel wall thickening. Small hiatal hernia. Descending duodenum lipoma. Vascular/Lymphatic: Aortic atherosclerosis. No enlarged abdominal or pelvic lymph nodes. Reproductive: Uterus and bilateral adnexa are unremarkable. Other: No free intraperitoneal fluid or air. Musculoskeletal: No acute fracture. Review of the MIP images confirms the above findings. CT LUMBAR SPINE Segmentation: 5 lumbar type vertebrae. Alignment: No evidence of traumatic malalignment. Vertebrae: New acute angulation of the anterior cortex of L1 with sclerosis through the mid vertebral body. Findings are compatible with acute or subacute fracture. Chronic compression fracture of L2 and L5. 2 mm of retropulsion of the mid L1 vertebral body. Paraspinal and other soft tissues: See above. Disc levels: Age-related degenerative spondylosis. No severe spinal canal or neural foraminal narrowing. IMPRESSION: 1. Negative for acute pulmonary embolism. 2. Advanced emphysema. 3. Acute-subacute compression fracture of L1 with 2 mm of retropulsion. 4. No acute abnormality in the abdomen or pelvis. Electronically Signed   By: Minerva Fester M.D.   On: 12/10/2022 00:55   DG Chest Portable 1 View  Result Date: 12/09/2022 CLINICAL DATA:  Hypoxic.  Shortness of breath. EXAM: PORTABLE CHEST 1 VIEW COMPARISON:  Radiographs 09/07/2022 and CT chest 10/19/2022 FINDINGS: Rotated patient. Right chest wall Port-A-Cath tip in the mid SVC. Stable cardiomediastinal silhouette.  Aortic atherosclerotic calcification. Hyperinflation and chronic bronchitic change. No focal consolidation, pleural effusion, or pneumothorax. Lower thoracic vertebroplasty. IMPRESSION: No acute cardiopulmonary  process.  Emphysema. Electronically Signed   By: Minerva Fester M.D.   On: 12/09/2022 23:11    ED Course:  Initially placed on 4 L O2 nasal cannula due to desat to 86% on room air.  Treated with morphine 2 mg IV times multiple doses.    Assessment/Plan:  77 y.o. female with hx small cell lung cancer status post chemoradiation, atrial fibrillation, hypertension, hyperlipidemia, osteoporosis with prior fragility fracture, who presents due to worsening lower back pain.  Found to have likely acute-subacute L1 vertebral compression fracture  Acute-subacute L1 vertebral compression fracture With her history of hearing a pop and immediate pain favor this being more acute compared to rad read.  Fracture associated with 2 mm retropulsion.  No associated neurologic deficit.  -Multimodal pain control: Tylenol 1 g every 6 hours scheduled, tizanidine 2 mg nightly prn for muscle spasm, oxycodone 2.5/5 mg every 6 hours prn for moderate/severe -PT evaluation  Acute hypoxic respiratory failure CTA PE with emphysema, no acute abnormalities.  Suspect hypoxia due to underlying emphysema plus hypoventilation due to severity of her back pain.   -Weaned down to 2 L O2 at time of admission; did have recurrent desat to 86% on room air without -DuoNebs every 6 hours scheduled, albuterol neb every 6 hours as needed, incentive spirometer, flutter valve, encourage out of bed to chair -Home O2 desat screen in a.m.  Chronic medical problems: Small cell lung cancer: Completed chemoradiation A-fib: Continue her home metoprolol, amiodarone. not on anticoagulation Hypertension: On metoprolol per above HLD: Continue home atorvastatin  Body mass index is 19.16 kg/m.    DVT prophylaxis:  Lovenox Code Status:  DNR/DNI(Do NOT Intubate); confirmed CODE STATUS with patient Diet:  Diet Orders (From admission, onward)     Start     Ordered   12/10/22 0329  Diet regular Room service appropriate? Yes; Fluid consistency: Thin   Diet effective now       Question Answer Comment  Room service appropriate? Yes   Fluid consistency: Thin      12/10/22 0333           Family Communication: No Consults: None Admission status:   Observation, Telemetry bed  Severity of Illness: The appropriate patient status for this patient is OBSERVATION. Observation status is judged to be reasonable and necessary in order to provide the required intensity of service to ensure the patient's safety. The patient's presenting symptoms, physical exam findings, and initial radiographic and laboratory data in the context of their medical condition is felt to place them at decreased risk for further clinical deterioration. Furthermore, it is anticipated that the patient will be medically stable for discharge from the hospital within 2 midnights of admission.    Dolly Rias, MD [facility]  How to contact the [facility] Attending or Consulting provider 7A - 7P or covering provider during after hours 7P -7A, for this patient.  Check the care team in [facility] and look for a) attending/consulting [facility] provider listed and b) the [facility] team listed Log into [email or web address] and use [facility] universal password to access. If you do not have the password, please contact the hospital operator. Locate the [facility] provider you are looking for under [facility] and page to a number that you can be directly reached. If you still have difficulty reaching the provider, please page the [facility] (Director on Call)  for the Hospitalists listed on amion for assistance.  12/10/2022, 4:22 AM

## 2022-12-10 NOTE — Progress Notes (Signed)
PROGRESS NOTE    Sheena [name]  BMW:[id number] DOB: 1946-01-08 DOA: 12/09/2022 PCP: [name], MD   Brief Narrative:  77 year old with history of small cell lung cancer status post chemoradiation, A-fib, HTN, HLD, osteoporosis with prior fragility fracture comes to the hospital with acute lower back pain.  CT angio was negative besides advanced emphysema.  CT of the lumbar spine suggested acute/subacute compression fracture of L1. PT/OT = SNF   Assessment & Plan:  Principal Problem:   Lumbar vertebral fracture (HCC)   Acute-subacute L1 vertebral compression fracture Acute fracture associated with 2 mm retropulsion without any neurologic deficits.  For now pain control, bowel regimen.  PT/OT evaluation.  If we have a hard time controlling pain, will consider consulting IR for possible kyphoplasty or any further intervention   Acute hypoxic respiratory failure History of emphysema Small cell lung cancer status post chemoradiation CTA is negative for PE but shows advanced emphysema.  Patient also has underlying lung malignancy.  Continue bronchodilators scheduled and as needed.  I-S/flutter valve.   Permanent atrial fibrillation Continue Lopressor and amiodarone.  Not on long-term anticoagulation  Hypertension  On metoprolol per above.  IV as needed  HLD  Continue home atorvastatin   PT/OT = SNF  DVT prophylaxis: Lovenox Code Status: DNR Family Communication:   On going pain control.  Will need SNF  Subjective: Overall weak. Still having back pain.   Examination:  General exam: Appears calm and comfortable; 2L [place]. Elderly frail.  Respiratory system: Clear to auscultation. Respiratory effort normal. Cardiovascular system: S1 & S2 heard, RRR. No JVD, murmurs, rubs, gallops or clicks. No pedal edema. Gastrointestinal system: Abdomen is nondistended, soft and nontender. No organomegaly or masses felt. Normal bowel sounds heard. Central nervous system: Alert and  oriented. No focal neurological deficits. Extremities: Symmetric 5 x 5 power. Skin: No rashes, lesions or ulcers Psychiatry: Judgement and insight appear normal. Mood & affect appropriate.      Diet Orders (From admission, onward)     Start     Ordered   12/10/22 0329  Diet regular Room service appropriate? Yes; Fluid consistency: Thin  Diet effective now       Question Answer Comment  Room service appropriate? Yes   Fluid consistency: Thin      12/10/22 0333            Objective: Vitals:   12/10/22 0715 12/10/22 0717 12/10/22 0730 12/10/22 0800  BP: 132/71  (!) 156/67 (!) 116/58  Pulse: 71  80 84  Resp: (!) 21  19 16   Temp:  97.9 F (36.6 C)    TempSrc:  Oral    SpO2: 99%  97% 93%  Weight:       No intake or output data in the 24 hours ending 12/10/22 0849 Filed Weights   12/10/22 0300  Weight: 44.5 kg    Scheduled Meds:  acetaminophen  1,000 mg Oral Q6H WA   amiodarone  200 mg Oral Daily   atorvastatin  20 mg Oral Daily   enoxaparin (LOVENOX) injection  40 mg Subcutaneous Q24H   ipratropium-albuterol  3 mL Nebulization Q6H   metoprolol tartrate  25 mg Oral BID   Continuous Infusions:  Nutritional status     Body mass index is 19.16 kg/m.  Data Reviewed:   CBC: Recent Labs  Lab 12/09/22 2152  WBC 9.4  NEUTROABS 8.0*  HGB 10.8*  HCT 33.5*  MCV 103.4*  PLT 111*   Basic Metabolic Panel: Recent Labs  Lab 12/09/22 2152  NA 134*  K 4.2  CL 95*  CO2 25  GLUCOSE 124*  BUN 15  CREATININE 0.98  CALCIUM 10.0   GFR: Estimated Creatinine Clearance: 34.8 mL/min (by C-G formula based on SCr of 0.98 mg/dL). Liver Function Tests: Recent Labs  Lab 12/09/22 2152  AST 24  ALT 15  ALKPHOS 77  BILITOT 1.0  PROT 7.0  ALBUMIN 3.2*   No results for input(s): "LIPASE", "AMYLASE" in the last 168 hours. No results for input(s): "AMMONIA" in the last 168 hours. Coagulation Profile: No results for input(s): "INR", "PROTIME" in the last 168  hours. Cardiac Enzymes: No results for input(s): "CKTOTAL", "CKMB", "CKMBINDEX", "TROPONINI" in the last 168 hours. BNP (last 3 results) No results for input(s): "PROBNP" in the last 8760 hours. HbA1C: No results for input(s): "HGBA1C" in the last 72 hours. CBG: No results for input(s): "GLUCAP" in the last 168 hours. Lipid Profile: No results for input(s): "CHOL", "HDL", "LDLCALC", "TRIG", "CHOLHDL", "LDLDIRECT" in the last 72 hours. Thyroid Function Tests: No results for input(s): "TSH", "T4TOTAL", "FREET4", "T3FREE", "THYROIDAB" in the last 72 hours. Anemia Panel: No results for input(s): "VITAMINB12", "FOLATE", "FERRITIN", "TIBC", "IRON", "RETICCTPCT" in the last 72 hours. Sepsis Labs: No results for input(s): "PROCALCITON", "LATICACIDVEN" in the last 168 hours.  Recent Results (from the past 240 hour(s))  SARS Coronavirus 2 by RT PCR (hospital order, performed in [facility] hospital lab) *cepheid single result test* Anterior Nasal Swab     Status: None   Collection Time: 12/09/22  9:52 PM   Specimen: Anterior Nasal Swab  Result Value Ref Range Status   SARS Coronavirus 2 by RT PCR NEGATIVE NEGATIVE Final    Comment: Performed at [facility], [address] [place], [place] [address]         Radiology Studies: CT Angio Chest PE W and/or Wo Contrast  Result Date: 12/10/2022 CLINICAL DATA:  Low back pain, diarrhea, hypoxia, shortness of breath. PE suspected. Diarrhea. Increased fracture risk. EXAM: CT ANGIOGRAPHY CHEST CT ABDOMEN AND PELVIS WITH CONTRAST TECHNIQUE: Multidetector CT imaging of the chest was performed using the standard protocol during bolus administration of intravenous contrast. Multiplanar CT image reconstructions and MIPs were obtained to evaluate the vascular anatomy. Multidetector CT imaging of the abdomen and pelvis was performed using the standard protocol during bolus administration of intravenous contrast. RADIATION DOSE REDUCTION: This exam was  performed according to the departmental dose-optimization program which includes automated exposure control, adjustment of the mA and/or kV according to patient size and/or use of iterative reconstruction technique. CONTRAST:  75mL OMNIPAQUE IOHEXOL 350 MG/ML SOLN COMPARISON:  Chest radiograph 12/09/2022; CT chest 10/19/2022; PET/CT 06/12/2022; MRI lumbar spine 01/04/2013 FINDINGS: CTA CHEST FINDINGS Cardiovascular: Negative for acute pulmonary embolism. No pericardial effusion. Coronary artery and aortic atherosclerotic calcification. Mediastinum/Nodes: Trachea and esophagus are unremarkable. No thoracic adenopathy. Lungs/Pleura: Advanced emphysema. Bibasilar atelectasis/scarring. No focal consolidation, pleural effusion, or pneumothorax. Musculoskeletal: No acute fracture. Chronic compression fractures of T5, T8, T11, and T12. T12 vertebroplasty. Review of the MIP images confirms the above findings. CT ABDOMEN and PELVIS FINDINGS Hepatobiliary: No acute abnormality. Pancreas: Unremarkable. Spleen: Unremarkable. Adrenals/Urinary Tract: Normal adrenal glands. No urinary calculi or hydronephrosis. Unremarkable bladder. Stomach/Bowel: Normal caliber large and small bowel. No bowel wall thickening. Small hiatal hernia. Descending duodenum lipoma. Vascular/Lymphatic: Aortic atherosclerosis. No enlarged abdominal or pelvic lymph nodes. Reproductive: Uterus and bilateral adnexa are unremarkable. Other: No free intraperitoneal fluid or air. Musculoskeletal: No acute fracture. Review  of the MIP images confirms the above findings. CT LUMBAR SPINE Segmentation: 5 lumbar type vertebrae. Alignment: No evidence of traumatic malalignment. Vertebrae: New acute angulation of the anterior cortex of L1 with sclerosis through the mid vertebral body. Findings are compatible with acute or subacute fracture. Chronic compression fracture of L2 and L5. 2 mm of retropulsion of the mid L1 vertebral body. Paraspinal and other soft tissues:  See above. Disc levels: Age-related degenerative spondylosis. No severe spinal canal or neural foraminal narrowing. IMPRESSION: 1. Negative for acute pulmonary embolism. 2. Advanced emphysema. 3. Acute-subacute compression fracture of L1 with 2 mm of retropulsion. 4. No acute abnormality in the abdomen or pelvis. Electronically Signed   By: Minerva Fester M.D.   On: 12/10/2022 00:55   CT ABDOMEN PELVIS W CONTRAST  Result Date: 12/10/2022 CLINICAL DATA:  Low back pain, diarrhea, hypoxia, shortness of breath. PE suspected. Diarrhea. Increased fracture risk. EXAM: CT ANGIOGRAPHY CHEST CT ABDOMEN AND PELVIS WITH CONTRAST TECHNIQUE: Multidetector CT imaging of the chest was performed using the standard protocol during bolus administration of intravenous contrast. Multiplanar CT image reconstructions and MIPs were obtained to evaluate the vascular anatomy. Multidetector CT imaging of the abdomen and pelvis was performed using the standard protocol during bolus administration of intravenous contrast. RADIATION DOSE REDUCTION: This exam was performed according to the departmental dose-optimization program which includes automated exposure control, adjustment of the mA and/or kV according to patient size and/or use of iterative reconstruction technique. CONTRAST:  75mL OMNIPAQUE IOHEXOL 350 MG/ML SOLN COMPARISON:  Chest radiograph 12/09/2022; CT chest 10/19/2022; PET/CT 06/12/2022; MRI lumbar spine 01/04/2013 FINDINGS: CTA CHEST FINDINGS Cardiovascular: Negative for acute pulmonary embolism. No pericardial effusion. Coronary artery and aortic atherosclerotic calcification. Mediastinum/Nodes: Trachea and esophagus are unremarkable. No thoracic adenopathy. Lungs/Pleura: Advanced emphysema. Bibasilar atelectasis/scarring. No focal consolidation, pleural effusion, or pneumothorax. Musculoskeletal: No acute fracture. Chronic compression fractures of T5, T8, T11, and T12. T12 vertebroplasty. Review of the MIP images confirms  the above findings. CT ABDOMEN and PELVIS FINDINGS Hepatobiliary: No acute abnormality. Pancreas: Unremarkable. Spleen: Unremarkable. Adrenals/Urinary Tract: Normal adrenal glands. No urinary calculi or hydronephrosis. Unremarkable bladder. Stomach/Bowel: Normal caliber large and small bowel. No bowel wall thickening. Small hiatal hernia. Descending duodenum lipoma. Vascular/Lymphatic: Aortic atherosclerosis. No enlarged abdominal or pelvic lymph nodes. Reproductive: Uterus and bilateral adnexa are unremarkable. Other: No free intraperitoneal fluid or air. Musculoskeletal: No acute fracture. Review of the MIP images confirms the above findings. CT LUMBAR SPINE Segmentation: 5 lumbar type vertebrae. Alignment: No evidence of traumatic malalignment. Vertebrae: New acute angulation of the anterior cortex of L1 with sclerosis through the mid vertebral body. Findings are compatible with acute or subacute fracture. Chronic compression fracture of L2 and L5. 2 mm of retropulsion of the mid L1 vertebral body. Paraspinal and other soft tissues: See above. Disc levels: Age-related degenerative spondylosis. No severe spinal canal or neural foraminal narrowing. IMPRESSION: 1. Negative for acute pulmonary embolism. 2. Advanced emphysema. 3. Acute-subacute compression fracture of L1 with 2 mm of retropulsion. 4. No acute abnormality in the abdomen or pelvis. Electronically Signed   By: Minerva Fester M.D.   On: 12/10/2022 00:55   CT L-SPINE NO CHARGE  Result Date: 12/10/2022 CLINICAL DATA:  Low back pain, diarrhea, hypoxia, shortness of breath. PE suspected. Diarrhea. Increased fracture risk. EXAM: CT ANGIOGRAPHY CHEST CT ABDOMEN AND PELVIS WITH CONTRAST TECHNIQUE: Multidetector CT imaging of the chest was performed using the standard protocol during bolus administration of intravenous contrast. Multiplanar CT image reconstructions and  MIPs were obtained to evaluate the vascular anatomy. Multidetector CT imaging of the  abdomen and pelvis was performed using the standard protocol during bolus administration of intravenous contrast. RADIATION DOSE REDUCTION: This exam was performed according to the departmental dose-optimization program which includes automated exposure control, adjustment of the mA and/or kV according to patient size and/or use of iterative reconstruction technique. CONTRAST:  75mL OMNIPAQUE IOHEXOL 350 MG/ML SOLN COMPARISON:  Chest radiograph 12/09/2022; CT chest 10/19/2022; PET/CT 06/12/2022; MRI lumbar spine 01/04/2013 FINDINGS: CTA CHEST FINDINGS Cardiovascular: Negative for acute pulmonary embolism. No pericardial effusion. Coronary artery and aortic atherosclerotic calcification. Mediastinum/Nodes: Trachea and esophagus are unremarkable. No thoracic adenopathy. Lungs/Pleura: Advanced emphysema. Bibasilar atelectasis/scarring. No focal consolidation, pleural effusion, or pneumothorax. Musculoskeletal: No acute fracture. Chronic compression fractures of T5, T8, T11, and T12. T12 vertebroplasty. Review of the MIP images confirms the above findings. CT ABDOMEN and PELVIS FINDINGS Hepatobiliary: No acute abnormality. Pancreas: Unremarkable. Spleen: Unremarkable. Adrenals/Urinary Tract: Normal adrenal glands. No urinary calculi or hydronephrosis. Unremarkable bladder. Stomach/Bowel: Normal caliber large and small bowel. No bowel wall thickening. Small hiatal hernia. Descending duodenum lipoma. Vascular/Lymphatic: Aortic atherosclerosis. No enlarged abdominal or pelvic lymph nodes. Reproductive: Uterus and bilateral adnexa are unremarkable. Other: No free intraperitoneal fluid or air. Musculoskeletal: No acute fracture. Review of the MIP images confirms the above findings. CT LUMBAR SPINE Segmentation: 5 lumbar type vertebrae. Alignment: No evidence of traumatic malalignment. Vertebrae: New acute angulation of the anterior cortex of L1 with sclerosis through the mid vertebral body. Findings are compatible with acute  or subacute fracture. Chronic compression fracture of L2 and L5. 2 mm of retropulsion of the mid L1 vertebral body. Paraspinal and other soft tissues: See above. Disc levels: Age-related degenerative spondylosis. No severe spinal canal or neural foraminal narrowing. IMPRESSION: 1. Negative for acute pulmonary embolism. 2. Advanced emphysema. 3. Acute-subacute compression fracture of L1 with 2 mm of retropulsion. 4. No acute abnormality in the abdomen or pelvis. Electronically Signed   By: Minerva Fester M.D.   On: 12/10/2022 00:55   DG Chest Portable 1 View  Result Date: 12/09/2022 CLINICAL DATA:  Hypoxic.  Shortness of breath. EXAM: PORTABLE CHEST 1 VIEW COMPARISON:  Radiographs 09/07/2022 and CT chest 10/19/2022 FINDINGS: Rotated patient. Right chest wall Port-A-Cath tip in the mid SVC. Stable cardiomediastinal silhouette. Aortic atherosclerotic calcification. Hyperinflation and chronic bronchitic change. No focal consolidation, pleural effusion, or pneumothorax. Lower thoracic vertebroplasty. IMPRESSION: No acute cardiopulmonary process.  Emphysema. Electronically Signed   By: Minerva Fester M.D.   On: 12/09/2022 23:11           LOS: 0 days   Time spent= 35 mins    Miguel Rota, MD [facility]  If 7PM-7AM, please contact night-coverage  12/10/2022, 8:49 AM

## 2022-12-10 NOTE — ED Notes (Signed)
Pt likes to take medications with applesauce

## 2022-12-11 DIAGNOSIS — S32000D Wedge compression fracture of unspecified lumbar vertebra, subsequent encounter for fracture with routine healing: Secondary | ICD-10-CM | POA: Diagnosis not present

## 2022-12-11 LAB — BASIC METABOLIC PANEL
Anion gap: 9 (ref 5–15)
BUN: 16 mg/dL (ref 8–23)
CO2: 30 mmol/L (ref 22–32)
Calcium: 9.3 mg/dL (ref 8.9–10.3)
Chloride: 92 mmol/L — ABNORMAL LOW (ref 98–111)
Creatinine, Ser: 1.22 mg/dL — ABNORMAL HIGH (ref 0.44–1.00)
GFR, Estimated: 46 mL/min — ABNORMAL LOW (ref >60)
Glucose, Bld: 105 mg/dL — ABNORMAL HIGH (ref 70–99)
Potassium: 4.9 mmol/L (ref 3.5–5.1)
Sodium: 131 mmol/L — ABNORMAL LOW (ref 135–145)

## 2022-12-11 LAB — CBC
HCT: 32.5 % — ABNORMAL LOW (ref 36.0–46.0)
Hemoglobin: 10.5 g/dL — ABNORMAL LOW (ref 12.0–15.0)
MCH: 34.4 pg — ABNORMAL HIGH (ref 26.0–34.0)
MCHC: 32.3 g/dL (ref 30.0–36.0)
MCV: 106.6 fL — ABNORMAL HIGH (ref 80.0–100.0)
Platelets: 111 10*3/uL — ABNORMAL LOW (ref 150–400)
RBC: 3.05 MIL/uL — ABNORMAL LOW (ref 3.87–5.11)
RDW: 16.4 % — ABNORMAL HIGH (ref 11.5–15.5)
WBC: 6.9 10*3/uL (ref 4.0–10.5)
nRBC: 0 % (ref 0.0–0.2)

## 2022-12-11 LAB — MAGNESIUM: Magnesium: 2 mg/dL (ref 1.7–2.4)

## 2022-12-11 MED ORDER — IPRATROPIUM-ALBUTEROL 0.5-2.5 (3) MG/3ML IN SOLN
3.0000 mL | Freq: Three times a day (TID) | RESPIRATORY_TRACT | Status: DC
  Administered 2022-12-11: 3 mL via RESPIRATORY_TRACT
  Filled 2022-12-11: qty 3

## 2022-12-11 NOTE — TOC Initial Note (Addendum)
Transition of Care [facility]) - Initial/Assessment Note    Patient Details  Name: Sheena [name] MRN: [id number] Date of Birth: 05-28-1945  Transition of Care [facility]) CM/SW Contact:    Lorri Frederick, LCSW Phone Number: 12/11/2022, 12:09 PM  Clinical Narrative:     CSW met with pt regarding DC recommendation for SNF.  Pt reports that she lives at home with her husband but that he is also currently hospitalized at [facility].  Daughter [name] is main contact, permission given to speak with her and also with son Onalee Hua. Pt reports she does have HH in place once per week, but unable to recall which agency.  Pt hesitant, but does agree that she cannot DC home alone, daughter works during the day, so pt is agreeable to SNF.  Medicare choice document provided.  Permission given to send out referral on hub.             LM with daughter [name].  Referral sent out in hub for SNF.  1600: Bed offers provided to pt, daughter, and son in room.  They would like to accept offer at [facility].  Waiting on confirmation from [facility] that they can receive pt over the weekend.  Will need auth.   Expected Discharge Plan: Skilled Nursing Facility Barriers to Discharge: Continued Medical Work up, SNF Pending bed offer   Patient Goals and CMS Choice Patient states their goals for this hospitalization and ongoing recovery are:: be independent CMS Medicare.gov Compare Post Acute Care list provided to:: Patient Choice offered to / list presented to : Patient      Expected Discharge Plan and Services In-house Referral: Clinical Social Work   Post Acute Care Choice: Skilled Nursing Facility Living arrangements for the past 2 months: Single Family Home                                      Prior Living Arrangements/Services Living arrangements for the past 2 months: Single Family Home Lives with:: Spouse Patient language and need for interpreter reviewed:: Yes Do you feel safe going back to the  place where you live?: Yes      Need for Family Participation in Patient Care: Yes (Comment) Care giver support system in place?: Yes (comment) Current home services: Home PT (pt unsure which agency) Criminal Activity/Legal Involvement Pertinent to Current Situation/Hospitalization: No - Comment as needed  Activities of Daily Living   ADL Screening (condition at time of admission) Independently performs ADLs?: Yes (appropriate for developmental age) Does the patient have a NEW difficulty with bathing/dressing/toileting/self-feeding that is expected to last >3 days?: Yes (Initiates electronic notice to provider for possible OT consult) Does the patient have a NEW difficulty with getting in/out of bed, walking, or climbing stairs that is expected to last >3 days?: Yes (Initiates electronic notice to provider for possible PT consult) Does the patient have a NEW difficulty with communication that is expected to last >3 days?: No Is the patient deaf or have difficulty hearing?: No Does the patient have difficulty seeing, even when wearing glasses/contacts?: No Does the patient have difficulty concentrating, remembering, or making decisions?: Yes  Permission Sought/Granted Permission sought to share information with : Family Supports Permission granted to share information with : Yes, Verbal Permission Granted  Share Information with NAME: daughter [name], son Onalee Hua  Permission granted to share info w AGENCY: SNF  Emotional Assessment Appearance:: Appears stated age Attitude/Demeanor/Rapport: Engaged Affect (typically observed): Appropriate, Pleasant Orientation: : Oriented to Self, Oriented to Place, Oriented to  Time, Oriented to Situation      Admission diagnosis:  History of lung cancer [Z85.118] Lumbar vertebral fracture (HCC) [S32.009A] Acute respiratory failure with hypoxia (HCC) [J96.01] Acute midline low back pain without sciatica [M54.50] Closed compression fracture of  body of L1 vertebra (HCC) [S32.010A] Patient Active Problem List   Diagnosis Date Noted   Lumbar vertebral fracture (HCC) 12/10/2022   Symptomatic anemia 10/07/2022   New onset atrial fibrillation (HCC) 09/10/2022   Hypokalemia 09/10/2022   Hypomagnesemia 09/10/2022   Pancytopenia (HCC) 09/07/2022   Port-A-Cath in place 08/11/2022   Primary small cell carcinoma of lower lobe of left lung (HCC) 07/08/2022   Adenopathy 06/30/2022   Aortic atherosclerosis (HCC) 01/18/2020   Grade II diastolic dysfunction 03/06/2015   Hyperlipidemia 03/05/2014   Former smoker 02/19/2014   Disequilibrium 02/19/2014   History of vertebral compression fracture 01/04/2013   Chronic low back pain 12/13/2012   Sinusitis, chronic 09/03/2012   Essential hypertension, benign 09/03/2012   Ataxia 04/01/2011   Anxiety 04/01/2011   Blurred vision 03/21/2010   Osteoporosis 03/21/2010   MURMUR 03/21/2010   Hyperglycemia 03/21/2010   PCP:  [name], MD Pharmacy:   [facility] [phone] [address] [address], [place] - [address] [place] [address] Phone: [phone] Fax: [phone]     Social Determinants of Health (SDOH) Social History: SDOH Screenings   Food Insecurity: No Food Insecurity (12/10/2022)  Housing: Patient Declined (12/10/2022)  Transportation Needs: Unmet Transportation Needs (12/10/2022)  Utilities: Not At Risk (12/10/2022)  Depression (PHQ2-9): Low Risk  (01/09/2022)  Financial Resource Strain: Low Risk  (01/09/2022)  Physical Activity: Inactive (01/09/2022)  Social Connections: Moderately Isolated (01/09/2022)  Stress: No Stress Concern Present (01/09/2022)  Tobacco Use: Medium Risk (12/09/2022)   SDOH Interventions:     Readmission Risk Interventions    10/09/2022    2:31 PM  Readmission Risk Prevention Plan  Transportation Screening Complete  PCP or Specialist Appt within 3-5 Days Complete  HRI or Home Care Consult  Complete  Social Work Consult for Recovery Care Planning/Counseling Complete  Palliative Care Screening Not Applicable  Medication Review Oceanographer) Complete

## 2022-12-11 NOTE — [place] FL2 (Signed)
[place] MEDICAID FL2 LEVEL OF CARE FORM     IDENTIFICATION  Patient Name: Sheena [name] Age Birthdate: 31-Mar-1945 Sex: female Admission Date (Current Location): 12/09/2022  [place] and [place] Number:  Haynes Bast [id number] S Facility and Address:  [facility] [place], [address] [place], [place] [address]      Provider Number: [id number]  Attending Physician Name and Address:  Miguel Rota, MD  Relative Name and Phone Number:  carty,tabitha Daughter   [phone]    Current Level of Care: Hospital Recommended Level of Care: Skilled Nursing Facility Prior Approval Number:    Date Approved/Denied:   PASRR Number: [id number]  Discharge Plan: SNF    Current Diagnoses: Patient Active Problem List   Diagnosis Date Noted   Lumbar vertebral fracture (HCC) 12/10/2022   Symptomatic anemia 10/07/2022   New onset atrial fibrillation (HCC) 09/10/2022   Hypokalemia 09/10/2022   Hypomagnesemia 09/10/2022   Pancytopenia (HCC) 09/07/2022   Port-A-Cath in place 08/11/2022   Primary small cell carcinoma of lower lobe of left lung (HCC) 07/08/2022   Adenopathy 06/30/2022   Aortic atherosclerosis (HCC) 01/18/2020   Grade II diastolic dysfunction 03/06/2015   Hyperlipidemia 03/05/2014   Former smoker 02/19/2014   Disequilibrium 02/19/2014   History of vertebral compression fracture 01/04/2013   Chronic low back pain 12/13/2012   Sinusitis, chronic 09/03/2012   Essential hypertension, benign 09/03/2012   Ataxia 04/01/2011   Anxiety 04/01/2011   Blurred vision 03/21/2010   Osteoporosis 03/21/2010   MURMUR 03/21/2010   Hyperglycemia 03/21/2010    Orientation RESPIRATION BLADDER Height & Weight     Self, Time, Situation, Place  O2 Continent Weight: 98 lb 1.7 oz (44.5 kg) (From 10/2022) Height:     BEHAVIORAL SYMPTOMS/MOOD NEUROLOGICAL BOWEL NUTRITION STATUS      Continent Diet (see discharge summary)  AMBULATORY STATUS COMMUNICATION OF NEEDS Skin    Extensive Assist Verbally Normal                       Personal Care Assistance Level of Assistance  Bathing, Feeding, Dressing Bathing Assistance: Limited assistance Feeding assistance: Limited assistance Dressing Assistance: Limited assistance     Functional Limitations Info  Sight, Hearing, Speech Sight Info: Adequate Hearing Info: Adequate Speech Info: Adequate    SPECIAL CARE FACTORS FREQUENCY  PT (By licensed PT), OT (By licensed OT)     PT Frequency: 5x week OT Frequency: 5x week            Contractures Contractures Info: Not present    Additional Factors Info  Code Status, Allergies Code Status Info: DNR Allergies Info: Antihistamines, Diphenhydramine-type, Aspirin, Evista (Raloxifene)           Current Medications (12/11/2022):  This is the current hospital active medication list Current Facility-Administered Medications  Medication Dose Route Frequency Provider Last Rate Last Admin   acetaminophen (TYLENOL) tablet 1,000 mg  1,000 mg Oral Q6H WA Segars, Christiane Ha, MD   1,000 mg at 12/11/22 1001   albuterol (PROVENTIL) (2.5 MG/3ML) 0.083% nebulizer solution 2.5 mg  2.5 mg Nebulization Q6H PRN Segars, Christiane Ha, MD       amiodarone (PACERONE) tablet 200 mg  200 mg Oral Daily Segars, Christiane Ha, MD   200 mg at 12/11/22 1001   atorvastatin (LIPITOR) tablet 20 mg  20 mg Oral Daily Segars, Christiane Ha, MD   20 mg at 12/11/22 1011   docusate sodium (COLACE) capsule 100 mg  100 mg Oral BID Miguel Rota, MD  100 mg at 12/11/22 1001   enoxaparin (LOVENOX) injection 30 mg  30 mg Subcutaneous Q24H Hammons, Kimberly B, RPH       hydrALAZINE (APRESOLINE) injection 10 mg  10 mg Intravenous Q4H PRN Amin, Ankit C, MD       hydrOXYzine (ATARAX) tablet 25 mg  25 mg Oral TID PRN Amin, Ankit C, MD       ipratropium-albuterol (DUONEB) 0.5-2.5 (3) MG/3ML nebulizer solution 3 mL  3 mL Nebulization TID Amin, Ankit C, MD   3 mL at 12/11/22 0858   metoprolol tartrate (LOPRESSOR)  injection 5 mg  5 mg Intravenous Q4H PRN Amin, Ankit C, MD       metoprolol tartrate (LOPRESSOR) tablet 25 mg  25 mg Oral BID Dolly Rias, MD   25 mg at 12/11/22 1001   ondansetron (ZOFRAN) injection 4 mg  4 mg Intravenous Q6H PRN Amin, Ankit C, MD       oxyCODONE (Oxy IR/ROXICODONE) immediate release tablet 2.5 mg  2.5 mg Oral Q4H PRN Dolly Rias, MD       Or   oxyCODONE (Oxy IR/ROXICODONE) immediate release tablet 5 mg  5 mg Oral Q4H PRN Segars, Christiane Ha, MD   5 mg at 12/11/22 1011   polyethylene glycol (MIRALAX / GLYCOLAX) packet 17 g  17 g Oral Daily PRN Segars, Christiane Ha, MD       senna-docusate (Senokot-S) tablet 1 tablet  1 tablet Oral QHS PRN Amin, Ankit C, MD       tiZANidine (ZANAFLEX) tablet 2 mg  2 mg Oral QHS PRN Dolly Rias, MD   2 mg at 12/10/22 2100     Discharge Medications: Please see discharge summary for a list of discharge medications.  Relevant Imaging Results:  Relevant Lab Results:   Additional Information SSN: [id number]  Lorri Frederick, LCSW

## 2022-12-11 NOTE — Progress Notes (Signed)
PROGRESS NOTE    Sheena [name]  ZOX:[id number] DOB: [birth year] DOA: 12/09/2022 PCP: [name], MD     Brief Narrative:  77 year old with history of small cell lung cancer status post chemoradiation, A-fib, HTN, HLD, osteoporosis with prior fragility fracture comes to the hospital with acute lower back pain.  CT angio was negative besides advanced emphysema.  CT of the lumbar spine suggested acute/subacute compression fracture of L1. PT/OT = SNF     Assessment & Plan:  Principal Problem:   Lumbar vertebral fracture (HCC)   Acute-subacute L1 vertebral compression fracture Acute fracture associated with 2 mm retropulsion without any neurologic deficits.  For now pain control, bowel regimen.  PT/OT-SNF P.o. medication and muscle relaxer seems to be controlling her pain.   Acute hypoxic respiratory failure History of emphysema Small cell lung cancer status post chemoradiation CTA is negative for PE but shows advanced emphysema.  Patient also has underlying lung malignancy.  Continue bronchodilators scheduled and as needed.  I-S/flutter valve.   Permanent atrial fibrillation Continue Lopressor and amiodarone.  Not on long-term anticoagulation   Hypertension  On metoprolol per above.  IV as needed   HLD  Continue home atorvastatin   PT/OT = SNF   DVT prophylaxis: Lovenox Code Status: DNR Family Communication:  Called daughter On going pain control.  Will need SNF      Subjective: Pain with mobility Feels ok otherwise.   Examination:  General exam: Appears calm and comfortable; frail.  Respiratory system: Clear to auscultation. Respiratory effort normal. Cardiovascular system: S1 & S2 heard, RRR. No JVD, murmurs, rubs, gallops or clicks. No pedal edema. Gastrointestinal system: Abdomen is nondistended, soft and nontender. No organomegaly or masses felt. Normal bowel sounds heard. Central nervous system: Alert and oriented. No focal neurological  deficits. Extremities: Symmetric 5 x 5 power. Skin: No rashes, lesions or ulcers Psychiatry: Judgement and insight appear normal. Mood & affect appropriate.       Diet Orders (From admission, onward)     Start     Ordered   12/10/22 0329  Diet regular Room service appropriate? Yes; Fluid consistency: Thin  Diet effective now       Question Answer Comment  Room service appropriate? Yes   Fluid consistency: Thin      12/10/22 0333            Objective: Vitals:   12/10/22 1937 12/10/22 2025 12/11/22 0750 12/11/22 0859  BP: 120/60  (!) 134/55   Pulse: 78  70 73  Resp: (!) 22  16 19   Temp: 98 F (36.7 C)  97.8 F (36.6 C)   TempSrc: Oral  Oral   SpO2: 94% 95% 98% 97%  Weight:        Intake/Output Summary (Last 24 hours) at 12/11/2022 1045 Last data filed at 12/10/2022 2100 Gross per 24 hour  Intake 320 ml  Output --  Net 320 ml   Filed Weights   12/10/22 0300  Weight: 44.5 kg    Scheduled Meds:  acetaminophen  1,000 mg Oral Q6H WA   amiodarone  200 mg Oral Daily   atorvastatin  20 mg Oral Daily   docusate sodium  100 mg Oral BID   enoxaparin (LOVENOX) injection  30 mg Subcutaneous Q24H   ipratropium-albuterol  3 mL Nebulization TID   metoprolol tartrate  25 mg Oral BID   Continuous Infusions:  Nutritional status     Body mass index is 19.16 kg/m.  Data Reviewed:   CBC:  Recent Labs  Lab 12/09/22 2152 12/11/22 0618  WBC 9.4 6.9  NEUTROABS 8.0*  --   HGB 10.8* 10.5*  HCT 33.5* 32.5*  MCV 103.4* 106.6*  PLT 111* 111*   Basic Metabolic Panel: Recent Labs  Lab 12/09/22 2152 12/11/22 0618  NA 134* 131*  K 4.2 4.9  CL 95* 92*  CO2 25 30  GLUCOSE 124* 105*  BUN 15 16  CREATININE 0.98 1.22*  CALCIUM 10.0 9.3  MG  --  2.0   GFR: Estimated Creatinine Clearance: 28 mL/min (A) (by C-G formula based on SCr of 1.22 mg/dL (H)). Liver Function Tests: Recent Labs  Lab 12/09/22 2152  AST 24  ALT 15  ALKPHOS 77  BILITOT 1.0  PROT 7.0   ALBUMIN 3.2*   No results for input(s): "LIPASE", "AMYLASE" in the last 168 hours. No results for input(s): "AMMONIA" in the last 168 hours. Coagulation Profile: No results for input(s): "INR", "PROTIME" in the last 168 hours. Cardiac Enzymes: No results for input(s): "CKTOTAL", "CKMB", "CKMBINDEX", "TROPONINI" in the last 168 hours. BNP (last 3 results) No results for input(s): "PROBNP" in the last 8760 hours. HbA1C: No results for input(s): "HGBA1C" in the last 72 hours. CBG: No results for input(s): "GLUCAP" in the last 168 hours. Lipid Profile: No results for input(s): "CHOL", "HDL", "LDLCALC", "TRIG", "CHOLHDL", "LDLDIRECT" in the last 72 hours. Thyroid Function Tests: Recent Labs    12/10/22 0108  TSH 1.978   Anemia Panel: No results for input(s): "VITAMINB12", "FOLATE", "FERRITIN", "TIBC", "IRON", "RETICCTPCT" in the last 72 hours. Sepsis Labs: No results for input(s): "PROCALCITON", "LATICACIDVEN" in the last 168 hours.  Recent Results (from the past 240 hour(s))  SARS Coronavirus 2 by RT PCR (hospital order, performed in [facility] hospital lab) *cepheid single result test* Anterior Nasal Swab     Status: None   Collection Time: 12/09/22  9:52 PM   Specimen: Anterior Nasal Swab  Result Value Ref Range Status   SARS Coronavirus 2 by RT PCR NEGATIVE NEGATIVE Final    Comment: Performed at [facility], [address] [place], [place] [address]         Radiology Studies: CT Angio Chest PE W and/or Wo Contrast  Result Date: 12/10/2022 CLINICAL DATA:  Low back pain, diarrhea, hypoxia, shortness of breath. PE suspected. Diarrhea. Increased fracture risk. EXAM: CT ANGIOGRAPHY CHEST CT ABDOMEN AND PELVIS WITH CONTRAST TECHNIQUE: Multidetector CT imaging of the chest was performed using the standard protocol during bolus administration of intravenous contrast. Multiplanar CT image reconstructions and MIPs were obtained to evaluate the vascular anatomy.  Multidetector CT imaging of the abdomen and pelvis was performed using the standard protocol during bolus administration of intravenous contrast. RADIATION DOSE REDUCTION: This exam was performed according to the departmental dose-optimization program which includes automated exposure control, adjustment of the mA and/or kV according to patient size and/or use of iterative reconstruction technique. CONTRAST:  75mL OMNIPAQUE IOHEXOL 350 MG/ML SOLN COMPARISON:  Chest radiograph 12/09/2022; CT chest 10/19/2022; PET/CT 06/12/2022; MRI lumbar spine 01/04/2013 FINDINGS: CTA CHEST FINDINGS Cardiovascular: Negative for acute pulmonary embolism. No pericardial effusion. Coronary artery and aortic atherosclerotic calcification. Mediastinum/Nodes: Trachea and esophagus are unremarkable. No thoracic adenopathy. Lungs/Pleura: Advanced emphysema. Bibasilar atelectasis/scarring. No focal consolidation, pleural effusion, or pneumothorax. Musculoskeletal: No acute fracture. Chronic compression fractures of T5, T8, T11, and T12. T12 vertebroplasty. Review of the MIP images confirms the above findings. CT ABDOMEN and PELVIS FINDINGS Hepatobiliary: No acute abnormality. Pancreas: Unremarkable. Spleen: Unremarkable.  Adrenals/Urinary Tract: Normal adrenal glands. No urinary calculi or hydronephrosis. Unremarkable bladder. Stomach/Bowel: Normal caliber large and small bowel. No bowel wall thickening. Small hiatal hernia. Descending duodenum lipoma. Vascular/Lymphatic: Aortic atherosclerosis. No enlarged abdominal or pelvic lymph nodes. Reproductive: Uterus and bilateral adnexa are unremarkable. Other: No free intraperitoneal fluid or air. Musculoskeletal: No acute fracture. Review of the MIP images confirms the above findings. CT LUMBAR SPINE Segmentation: 5 lumbar type vertebrae. Alignment: No evidence of traumatic malalignment. Vertebrae: New acute angulation of the anterior cortex of L1 with sclerosis through the mid vertebral body.  Findings are compatible with acute or subacute fracture. Chronic compression fracture of L2 and L5. 2 mm of retropulsion of the mid L1 vertebral body. Paraspinal and other soft tissues: See above. Disc levels: Age-related degenerative spondylosis. No severe spinal canal or neural foraminal narrowing. IMPRESSION: 1. Negative for acute pulmonary embolism. 2. Advanced emphysema. 3. Acute-subacute compression fracture of L1 with 2 mm of retropulsion. 4. No acute abnormality in the abdomen or pelvis. Electronically Signed   By: Minerva Fester M.D.   On: 12/10/2022 00:55   CT ABDOMEN PELVIS W CONTRAST  Result Date: 12/10/2022 CLINICAL DATA:  Low back pain, diarrhea, hypoxia, shortness of breath. PE suspected. Diarrhea. Increased fracture risk. EXAM: CT ANGIOGRAPHY CHEST CT ABDOMEN AND PELVIS WITH CONTRAST TECHNIQUE: Multidetector CT imaging of the chest was performed using the standard protocol during bolus administration of intravenous contrast. Multiplanar CT image reconstructions and MIPs were obtained to evaluate the vascular anatomy. Multidetector CT imaging of the abdomen and pelvis was performed using the standard protocol during bolus administration of intravenous contrast. RADIATION DOSE REDUCTION: This exam was performed according to the departmental dose-optimization program which includes automated exposure control, adjustment of the mA and/or kV according to patient size and/or use of iterative reconstruction technique. CONTRAST:  75mL OMNIPAQUE IOHEXOL 350 MG/ML SOLN COMPARISON:  Chest radiograph 12/09/2022; CT chest 10/19/2022; PET/CT 06/12/2022; MRI lumbar spine 01/04/2013 FINDINGS: CTA CHEST FINDINGS Cardiovascular: Negative for acute pulmonary embolism. No pericardial effusion. Coronary artery and aortic atherosclerotic calcification. Mediastinum/Nodes: Trachea and esophagus are unremarkable. No thoracic adenopathy. Lungs/Pleura: Advanced emphysema. Bibasilar atelectasis/scarring. No focal  consolidation, pleural effusion, or pneumothorax. Musculoskeletal: No acute fracture. Chronic compression fractures of T5, T8, T11, and T12. T12 vertebroplasty. Review of the MIP images confirms the above findings. CT ABDOMEN and PELVIS FINDINGS Hepatobiliary: No acute abnormality. Pancreas: Unremarkable. Spleen: Unremarkable. Adrenals/Urinary Tract: Normal adrenal glands. No urinary calculi or hydronephrosis. Unremarkable bladder. Stomach/Bowel: Normal caliber large and small bowel. No bowel wall thickening. Small hiatal hernia. Descending duodenum lipoma. Vascular/Lymphatic: Aortic atherosclerosis. No enlarged abdominal or pelvic lymph nodes. Reproductive: Uterus and bilateral adnexa are unremarkable. Other: No free intraperitoneal fluid or air. Musculoskeletal: No acute fracture. Review of the MIP images confirms the above findings. CT LUMBAR SPINE Segmentation: 5 lumbar type vertebrae. Alignment: No evidence of traumatic malalignment. Vertebrae: New acute angulation of the anterior cortex of L1 with sclerosis through the mid vertebral body. Findings are compatible with acute or subacute fracture. Chronic compression fracture of L2 and L5. 2 mm of retropulsion of the mid L1 vertebral body. Paraspinal and other soft tissues: See above. Disc levels: Age-related degenerative spondylosis. No severe spinal canal or neural foraminal narrowing. IMPRESSION: 1. Negative for acute pulmonary embolism. 2. Advanced emphysema. 3. Acute-subacute compression fracture of L1 with 2 mm of retropulsion. 4. No acute abnormality in the abdomen or pelvis. Electronically Signed   By: Minerva Fester M.D.   On: 12/10/2022 00:55   CT  L-SPINE NO CHARGE  Result Date: 12/10/2022 CLINICAL DATA:  Low back pain, diarrhea, hypoxia, shortness of breath. PE suspected. Diarrhea. Increased fracture risk. EXAM: CT ANGIOGRAPHY CHEST CT ABDOMEN AND PELVIS WITH CONTRAST TECHNIQUE: Multidetector CT imaging of the chest was performed using the  standard protocol during bolus administration of intravenous contrast. Multiplanar CT image reconstructions and MIPs were obtained to evaluate the vascular anatomy. Multidetector CT imaging of the abdomen and pelvis was performed using the standard protocol during bolus administration of intravenous contrast. RADIATION DOSE REDUCTION: This exam was performed according to the departmental dose-optimization program which includes automated exposure control, adjustment of the mA and/or kV according to patient size and/or use of iterative reconstruction technique. CONTRAST:  75mL OMNIPAQUE IOHEXOL 350 MG/ML SOLN COMPARISON:  Chest radiograph 12/09/2022; CT chest 10/19/2022; PET/CT 06/12/2022; MRI lumbar spine 01/04/2013 FINDINGS: CTA CHEST FINDINGS Cardiovascular: Negative for acute pulmonary embolism. No pericardial effusion. Coronary artery and aortic atherosclerotic calcification. Mediastinum/Nodes: Trachea and esophagus are unremarkable. No thoracic adenopathy. Lungs/Pleura: Advanced emphysema. Bibasilar atelectasis/scarring. No focal consolidation, pleural effusion, or pneumothorax. Musculoskeletal: No acute fracture. Chronic compression fractures of T5, T8, T11, and T12. T12 vertebroplasty. Review of the MIP images confirms the above findings. CT ABDOMEN and PELVIS FINDINGS Hepatobiliary: No acute abnormality. Pancreas: Unremarkable. Spleen: Unremarkable. Adrenals/Urinary Tract: Normal adrenal glands. No urinary calculi or hydronephrosis. Unremarkable bladder. Stomach/Bowel: Normal caliber large and small bowel. No bowel wall thickening. Small hiatal hernia. Descending duodenum lipoma. Vascular/Lymphatic: Aortic atherosclerosis. No enlarged abdominal or pelvic lymph nodes. Reproductive: Uterus and bilateral adnexa are unremarkable. Other: No free intraperitoneal fluid or air. Musculoskeletal: No acute fracture. Review of the MIP images confirms the above findings. CT LUMBAR SPINE Segmentation: 5 lumbar type  vertebrae. Alignment: No evidence of traumatic malalignment. Vertebrae: New acute angulation of the anterior cortex of L1 with sclerosis through the mid vertebral body. Findings are compatible with acute or subacute fracture. Chronic compression fracture of L2 and L5. 2 mm of retropulsion of the mid L1 vertebral body. Paraspinal and other soft tissues: See above. Disc levels: Age-related degenerative spondylosis. No severe spinal canal or neural foraminal narrowing. IMPRESSION: 1. Negative for acute pulmonary embolism. 2. Advanced emphysema. 3. Acute-subacute compression fracture of L1 with 2 mm of retropulsion. 4. No acute abnormality in the abdomen or pelvis. Electronically Signed   By: Minerva Fester M.D.   On: 12/10/2022 00:55   DG Chest Portable 1 View  Result Date: 12/09/2022 CLINICAL DATA:  Hypoxic.  Shortness of breath. EXAM: PORTABLE CHEST 1 VIEW COMPARISON:  Radiographs 09/07/2022 and CT chest 10/19/2022 FINDINGS: Rotated patient. Right chest wall Port-A-Cath tip in the mid SVC. Stable cardiomediastinal silhouette. Aortic atherosclerotic calcification. Hyperinflation and chronic bronchitic change. No focal consolidation, pleural effusion, or pneumothorax. Lower thoracic vertebroplasty. IMPRESSION: No acute cardiopulmonary process.  Emphysema. Electronically Signed   By: Minerva Fester M.D.   On: 12/09/2022 23:11           LOS: 0 days   Time spent= 35 mins    Miguel Rota, MD [facility]  If 7PM-7AM, please contact night-coverage  12/11/2022, 10:45 AM

## 2022-12-11 NOTE — Hospital Course (Addendum)
  Brief Narrative:  77 year old with history of small cell lung cancer status post chemoradiation, A-fib, HTN, HLD, osteoporosis with prior fragility fracture comes to the hospital with acute lower back pain.  CT angio was negative besides advanced emphysema.  CT of the lumbar spine suggested acute/subacute compression fracture of L1. PT/OT = SNF.  Started on Remeron due to concerns of depression and lack of appetite.  IR consulted secondary to uncontrolled back pain and patient scheduled for kyphoplasty.     Assessment & Plan:  Principal Problem:   Lumbar vertebral fracture (HCC)   Acute-subacute L1 vertebral compression fracture Acute fracture associated with 2 mm retropulsion without any neurologic deficits.  For now pain control, bowel regimen.  PT/OT-SNF P.o. medication and muscle relaxer seems to be controlling her pain.  IR consulted, planning kyphoplasty 10/15.   Acute hypoxic respiratory failure, improved History of emphysema Small cell lung cancer status post chemoradiation CTA is negative for PE but shows advanced emphysema.  Patient also has underlying lung malignancy.  Continue bronchodilators scheduled and as needed.  I-S/flutter valve.  Grieving - Secondary to recent loss of husband.  This is significantly hindering her care.  Seen by psychiatry.  On Remeron at bedtime, will adjust as needed.   Permanent atrial fibrillation Continue Lopressor and amiodarone.  Not on long-term anticoagulation   Hypertension  On metoprolol per above.  IV as needed   HLD  Continue home atorvastatin   PT/OT = SNF   DVT prophylaxis: Lovenox Code Status: DNR Family Communication: Daughter updated periodically Kyphoplasty, pain control thereafter likely SNF placement

## 2022-12-11 NOTE — Plan of Care (Signed)
  Problem: Education: Goal: Knowledge of General Education information will improve Description: Including pain rating scale, medication(s)/side effects and non-pharmacologic comfort measures Outcome: Progressing   Problem: Clinical Measurements: Goal: Ability to maintain clinical measurements within normal limits will improve Outcome: Progressing Goal: Will remain free from infection Outcome: Progressing Goal: Diagnostic test results will improve Outcome: Progressing   Problem: Activity: Goal: Risk for activity intolerance will decrease Outcome: Progressing   Problem: Nutrition: Goal: Adequate nutrition will be maintained Outcome: Progressing   Problem: Coping: Goal: Level of anxiety will decrease Outcome: Progressing   Problem: Pain Managment: Goal: General experience of comfort will improve Outcome: Progressing

## 2022-12-11 NOTE — Care Management Obs Status (Signed)
MEDICARE OBSERVATION STATUS NOTIFICATION   Patient Details  Name: Sheena [name] MRN: [id number] Date of Birth: 07-03-1945   Medicare Observation Status Notification Given:  Yes    Lorri Frederick, LCSW 12/11/2022, 12:22 PM

## 2022-12-12 DIAGNOSIS — S32000D Wedge compression fracture of unspecified lumbar vertebra, subsequent encounter for fracture with routine healing: Secondary | ICD-10-CM | POA: Diagnosis not present

## 2022-12-12 LAB — CBC
HCT: 30.9 % — ABNORMAL LOW (ref 36.0–46.0)
Hemoglobin: 9.8 g/dL — ABNORMAL LOW (ref 12.0–15.0)
MCH: 33 pg (ref 26.0–34.0)
MCHC: 31.7 g/dL (ref 30.0–36.0)
MCV: 104 fL — ABNORMAL HIGH (ref 80.0–100.0)
Platelets: 108 10*3/uL — ABNORMAL LOW (ref 150–400)
RBC: 2.97 MIL/uL — ABNORMAL LOW (ref 3.87–5.11)
RDW: 16.2 % — ABNORMAL HIGH (ref 11.5–15.5)
WBC: 4.4 10*3/uL (ref 4.0–10.5)
nRBC: 0 % (ref 0.0–0.2)

## 2022-12-12 LAB — MAGNESIUM: Magnesium: 2.1 mg/dL (ref 1.7–2.4)

## 2022-12-12 NOTE — Progress Notes (Signed)
OT Cancellation Note  Patient Details Name: Sheena [name] MRN: [id number] DOB: December 16, 1945   Cancelled Treatment:    Reason Eval/Treat Not Completed: Fatigue/lethargy limiting ability to participate. Pt sleeping heavily after pain meds per RN, OT will check back later as able  Galen Manila 12/12/2022, 11:45 AM

## 2022-12-12 NOTE — Plan of Care (Signed)

## 2022-12-13 DIAGNOSIS — S32000D Wedge compression fracture of unspecified lumbar vertebra, subsequent encounter for fracture with routine healing: Secondary | ICD-10-CM | POA: Diagnosis not present

## 2022-12-13 DIAGNOSIS — F4321 Adjustment disorder with depressed mood: Secondary | ICD-10-CM | POA: Diagnosis not present

## 2022-12-13 LAB — CBC
HCT: 30.3 % — ABNORMAL LOW (ref 36.0–46.0)
Hemoglobin: 10.2 g/dL — ABNORMAL LOW (ref 12.0–15.0)
MCH: 35.1 pg — ABNORMAL HIGH (ref 26.0–34.0)
MCHC: 33.7 g/dL (ref 30.0–36.0)
MCV: 104.1 fL — ABNORMAL HIGH (ref 80.0–100.0)
Platelets: 111 10*3/uL — ABNORMAL LOW (ref 150–400)
RBC: 2.91 MIL/uL — ABNORMAL LOW (ref 3.87–5.11)
RDW: 15.9 % — ABNORMAL HIGH (ref 11.5–15.5)
WBC: 9.1 10*3/uL (ref 4.0–10.5)
nRBC: 0 % (ref 0.0–0.2)

## 2022-12-13 LAB — MAGNESIUM: Magnesium: 1.9 mg/dL (ref 1.7–2.4)

## 2022-12-13 MED ORDER — MIRTAZAPINE 15 MG PO TABS
7.5000 mg | ORAL_TABLET | Freq: Every day | ORAL | Status: DC
  Administered 2022-12-13 – 2022-12-20 (×8): 7.5 mg via ORAL
  Filled 2022-12-13 (×8): qty 1

## 2022-12-13 MED ORDER — ENSURE ENLIVE PO LIQD
237.0000 mL | Freq: Three times a day (TID) | ORAL | Status: DC
  Administered 2022-12-13 – 2022-12-19 (×6): 237 mL via ORAL

## 2022-12-13 NOTE — Progress Notes (Addendum)
PROGRESS NOTE    Sheena [name]  RUE:[id number] DOB: 05-27-1945 DOA: 12/09/2022 PCP: [name], MD     Brief Narrative:  77 year old with history of small cell lung cancer status post chemoradiation, A-fib, HTN, HLD, osteoporosis with prior fragility fracture comes to the hospital with acute lower back pain.  CT angio was negative besides advanced emphysema.  CT of the lumbar spine suggested acute/subacute compression fracture of L1. PT/OT = SNF     Assessment & Plan:  Principal Problem:   Lumbar vertebral fracture (HCC)   Acute-subacute L1 vertebral compression fracture Acute fracture associated with 2 mm retropulsion without any neurologic deficits.  For now pain control, bowel regimen.  PT/OT-SNF P.o. medication and muscle relaxer seems to be controlling her pain.  Will go ahead and consult IR to see if she is a candidate for kyphoplasty   Acute hypoxic respiratory failure History of emphysema Small cell lung cancer status post chemoradiation CTA is negative for PE but shows advanced emphysema.  Patient also has underlying lung malignancy.  Continue bronchodilators scheduled and as needed.  I-S/flutter valve.  Grieving - Secondary to recent loss of husband.  This is significantly hindering her care.  Will start mirtazapine, consult chaplain and psychiatry   Permanent atrial fibrillation Continue Lopressor and amiodarone.  Not on long-term anticoagulation   Hypertension  On metoprolol per above.  IV as needed   HLD  Continue home atorvastatin   PT/OT = SNF   DVT prophylaxis: Lovenox Code Status: DNR Family Communication: Daughter at bedside SNF placement           Subjective: Patient states her pain is controlled at rest but has not really been moving around.  I spoke with the patient that she really needs to mobilize herself. Daughter present at bedside who stepped out of the room with me and informed me that patient recently lost her husband about 3  days ago and thinks she is grieving and likely not motivated at all in terms of making improvement   Examination:  General exam: Appears calm and comfortable, cachectic frail Respiratory system: Clear to auscultation. Respiratory effort normal. Cardiovascular system: S1 & S2 heard, RRR. No JVD, murmurs, rubs, gallops or clicks. No pedal edema. Gastrointestinal system: Abdomen is nondistended, soft and nontender. No organomegaly or masses felt. Normal bowel sounds heard. Central nervous system: Alert and oriented. No focal neurological deficits. Extremities: Symmetric 4 x 5 power. Skin: No rashes, lesions or ulcers Psychiatry: Judgement and insight appear normal. Mood & affect appropriate.       Diet Orders (From admission, onward)     Start     Ordered   12/10/22 0329  Diet regular Room service appropriate? Yes; Fluid consistency: Thin  Diet effective now       Question Answer Comment  Room service appropriate? Yes   Fluid consistency: Thin      12/10/22 0333            Objective: Vitals:   12/12/22 2029 12/13/22 0438 12/13/22 0737 12/13/22 0909  BP: (!) 127/54 113/64 (!) 144/64 (!) 164/73  Pulse: 76 90 90 (!) 101  Resp: 18 18 18    Temp: 97.7 F (36.5 C) 97.9 F (36.6 C) 97.6 F (36.4 C)   TempSrc: Oral Oral Oral   SpO2: 100% 97% 96%   Weight:        Intake/Output Summary (Last 24 hours) at 12/13/2022 1124 Last data filed at 12/13/2022 0900 Gross per 24 hour  Intake 590 ml  Output --  Net 590 ml   Filed Weights   12/10/22 0300  Weight: 44.5 kg    Scheduled Meds:  acetaminophen  1,000 mg Oral Q6H WA   amiodarone  200 mg Oral Daily   atorvastatin  20 mg Oral Daily   docusate sodium  100 mg Oral BID   enoxaparin (LOVENOX) injection  30 mg Subcutaneous Q24H   feeding supplement  237 mL Oral TID BM   metoprolol tartrate  25 mg Oral BID   mirtazapine  7.5 mg Oral QHS   Continuous Infusions:  Nutritional status     Body mass index is 19.16  kg/m.  Data Reviewed:   CBC: Recent Labs  Lab 12/09/22 2152 12/11/22 0618 12/12/22 0442 12/13/22 0636  WBC 9.4 6.9 4.4 9.1  NEUTROABS 8.0*  --   --   --   HGB 10.8* 10.5* 9.8* 10.2*  HCT 33.5* 32.5* 30.9* 30.3*  MCV 103.4* 106.6* 104.0* 104.1*  PLT 111* 111* 108* 111*   Basic Metabolic Panel: Recent Labs  Lab 12/09/22 2152 12/11/22 0618 12/12/22 0442 12/13/22 0636  NA 134* 131*  --   --   K 4.2 4.9  --   --   CL 95* 92*  --   --   CO2 25 30  --   --   GLUCOSE 124* 105*  --   --   BUN 15 16  --   --   CREATININE 0.98 1.22*  --   --   CALCIUM 10.0 9.3  --   --   MG  --  2.0 2.1 1.9   GFR: Estimated Creatinine Clearance: 28 mL/min (A) (by C-G formula based on SCr of 1.22 mg/dL (H)). Liver Function Tests: Recent Labs  Lab 12/09/22 2152  AST 24  ALT 15  ALKPHOS 77  BILITOT 1.0  PROT 7.0  ALBUMIN 3.2*   No results for input(s): "LIPASE", "AMYLASE" in the last 168 hours. No results for input(s): "AMMONIA" in the last 168 hours. Coagulation Profile: No results for input(s): "INR", "PROTIME" in the last 168 hours. Cardiac Enzymes: No results for input(s): "CKTOTAL", "CKMB", "CKMBINDEX", "TROPONINI" in the last 168 hours. BNP (last 3 results) No results for input(s): "PROBNP" in the last 8760 hours. HbA1C: No results for input(s): "HGBA1C" in the last 72 hours. CBG: No results for input(s): "GLUCAP" in the last 168 hours. Lipid Profile: No results for input(s): "CHOL", "HDL", "LDLCALC", "TRIG", "CHOLHDL", "LDLDIRECT" in the last 72 hours. Thyroid Function Tests: No results for input(s): "TSH", "T4TOTAL", "FREET4", "T3FREE", "THYROIDAB" in the last 72 hours. Anemia Panel: No results for input(s): "VITAMINB12", "FOLATE", "FERRITIN", "TIBC", "IRON", "RETICCTPCT" in the last 72 hours. Sepsis Labs: No results for input(s): "PROCALCITON", "LATICACIDVEN" in the last 168 hours.  Recent Results (from the past 240 hour(s))  SARS Coronavirus 2 by RT PCR (hospital  order, performed in [facility] hospital lab) *cepheid single result test* Anterior Nasal Swab     Status: None   Collection Time: 12/09/22  9:52 PM   Specimen: Anterior Nasal Swab  Result Value Ref Range Status   SARS Coronavirus 2 by RT PCR NEGATIVE NEGATIVE Final    Comment: Performed at [facility], [address], [place], [place] [address]         Radiology Studies: No results found.         LOS: 0 days   Time spent= 35 mins    Miguel Rota, MD [facility]  If 7PM-7AM,  please contact night-coverage  12/13/2022, 11:24 AM

## 2022-12-13 NOTE — Plan of Care (Signed)
Problem: Clinical Measurements: Goal: Respiratory complications will improve Outcome: Progressing Goal: Cardiovascular complication will be avoided Outcome: Progressing
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
## 2022-12-13 NOTE — Evaluation (Signed)
Occupational Therapy Evaluation Patient Details Name: Sheena [name] MRN: [id number] DOB: 1945-04-06 Today's Date: 12/13/2022   History of Present Illness 77 yo female presents to [facility] for back pain after hearing a "pop". Pt sustained acute L1 compression fx. Additional workup for acute hypoxic respiratory failure. PMH includes small cell lung cancer status post chemoradiation, afib, hypertension, hyperlipidemia, osteoporosis with prior fragility fracture.   Clinical Impression   Pt admitted for above, severely limited by back pain and refuses mobility today but did report that she was going to be willing to get up with therapy staff because they are good at moving people. Educated pt on several things today such as UB strengthening exercises while supine, back precautions, and pressure relief strategies, she also declined repositioning at this time. Pt needing Max to Total A for LB ADLs at this time due to back pain.  Pt would benefit from continued acute skilled OT services to address deficits and help transition to next level of care. Patient would benefit from post acute skilled rehab facility with <3 hours of therapy and 24/7 support       If plan is discharge home, recommend the following: A lot of help with walking and/or transfers;A lot of help with bathing/dressing/bathroom;Assistance with cooking/housework;Assist for transportation    Functional Status Assessment  Patient has had a recent decline in their functional status and demonstrates the ability to make significant improvements in function in a reasonable and predictable amount of time.  Equipment Recommendations  None recommended by OT (defer)    Recommendations for Other Services       Precautions / Restrictions Precautions Precautions: Fall;Back Precaution Booklet Issued: Yes (comment) Precaution Comments: Review back precautions Restrictions Weight Bearing Restrictions: No      Mobility Bed Mobility                General bed mobility comments: declined    Transfers                          Balance Overall balance assessment: Needs assistance     Sitting balance - Comments: pt declines EOB                                   ADL either performed or assessed with clinical judgement   ADL Overall ADL's : Needs assistance/impaired Eating/Feeding: Independent;Bed level   Grooming: Bed level;Set up   Upper Body Bathing: Bed level;Set up   Lower Body Bathing: Total assistance;Bed level   Upper Body Dressing : Minimal assistance;Bed level   Lower Body Dressing: Total assistance;Bed level   Toilet Transfer: Total assistance Toilet Transfer Details (indicate cue type and reason): not tolerable to bed mobility Toileting- Clothing Manipulation and Hygiene: Total assistance         General ADL Comments: Pt declined mobility and/or repositioning due to back pain. Educated pt on the benefits of rolling L<>R in bed with staff to prevent skin breakdown. Educated pt on UB strenghtening exercises     Vision         Perception         Praxis         Pertinent Vitals/Pain Pain Assessment Pain Assessment: 0-10 Pain Score: 7  Pain Location: low back Pain Descriptors / Indicators: Sore, Discomfort Pain Intervention(s): Monitored during session, Repositioned, Limited activity within patient's tolerance     Extremity/Trunk Assessment Upper Extremity  Assessment Upper Extremity Assessment: Generalized weakness (3/5 overall but ROM WFL)   Lower Extremity Assessment Lower Extremity Assessment: Generalized weakness   Cervical / Trunk Assessment Cervical / Trunk Assessment: Kyphotic   Communication Communication Communication: No apparent difficulties Cueing Techniques: Verbal cues;Gestural cues   Cognition Arousal: Alert Behavior During Therapy: Anxious Overall Cognitive Status: No family/caregiver present to determine baseline cognitive functioning                                  General Comments: Declines mobility due to fear of back pain increasing     General Comments       Exercises General Exercises - Upper Extremity Shoulder Flexion: AROM, Theraband, Supine, Both, 10 reps Theraband Level (Shoulder Flexion): Level 3 (Green) Shoulder Horizontal ABduction: AROM, 10 reps, Supine, Theraband, Strengthening, Both Theraband Level (Shoulder Horizontal Abduction): Level 3 (Green) Elbow Flexion: AROM, 10 reps, Both, Theraband Theraband Level (Elbow Flexion): Level 3 (Green) Elbow Extension: AROM, Strengthening, Both, 10 reps, Theraband Theraband Level (Elbow Extension): Level 3 (Green)   Shoulder Instructions      Home Living Family/patient expects to be discharged to:: Private residence Living Arrangements: Spouse/significant other Available Help at Discharge: Family;Available 24 hours/day Type of Home: House Home Access: Stairs to enter [employer] of Steps: 3 Entrance Stairs-Rails: Right Home Layout: Two level;Bed/bath upstairs Alternate Level Stairs-Number of Steps: 14 steps   Bathroom Shower/Tub: Producer, television/film/video: Standard     Home Equipment: Agricultural consultant (2 wheels)          Prior Functioning/Environment Prior Level of Function : Needs assist             Mobility Comments: pt reports needing a cane, but does not use AD. ADLs Comments: pt reports independence with ADLs, pt's husband would help as needed but is in the hospital on "life support for cancer, it's not looking good"        OT Problem List: Decreased knowledge of use of DME or AE;Pain;Decreased strength      OT Treatment/Interventions: Self-care/ADL training;Balance training;Therapeutic exercise;Therapeutic activities;Patient/family education;DME and/or AE instruction    OT Goals(Current goals can be found in the care plan section) Acute Rehab OT Goals Patient Stated Goal: To reduce pain OT Goal  Formulation: With patient Time For Goal Achievement: 12/27/22 Potential to Achieve Goals: Good ADL Goals Pt Will Perform Grooming: with set-up;with supervision;sitting Pt Will Perform Lower Body Bathing: with set-up;with supervision;sitting/lateral leans;with adaptive equipment Pt Will Perform Lower Body Dressing: with supervision;with set-up;sitting/lateral leans;with adaptive equipment Pt/caregiver will Perform Home Exercise Program: Increased strength;Both right and left upper extremity;With theraband;Independently;With written HEP provided Additional ADL Goal #1: Pt will demonstrate 5 mins EOB sitting tolerance with CGA in preparation to complete seated ADLs Additional ADL Goal #2: Pt will complete log rolling with min A for sacral pressure relief and assist with toileting while bed level  OT Frequency: Min 1X/week    Co-evaluation              AM-PAC OT "6 Clicks" Daily Activity     Outcome Measure Help from another person eating meals?: None Help from another person taking care of personal grooming?: A Little Help from another person toileting, which includes using toliet, bedpan, or urinal?: Total Help from another person bathing (including washing, rinsing, drying)?: A Lot Help from another person to put on and taking off regular upper body clothing?: A Little Help from  another person to put on and taking off regular lower body clothing?: Total 6 Click Score: 14   End of Session Equipment Utilized During Treatment: Other (comment) (theraband) Nurse Communication: Mobility status;Precautions (back precautions)  Activity Tolerance: Patient limited by pain Patient left: with call bell/phone within reach;in bed;with bed alarm set  OT Visit Diagnosis: Other abnormalities of gait and mobility (R26.89);Muscle weakness (generalized) (M62.81);Pain Pain - part of body:  (back)                Time: 2951-8841 OT Time Calculation (min): 19 min Charges:  OT General Charges $OT  Visit: 1 Visit OT Evaluation $OT Eval Moderate Complexity: 1 Mod  12/13/2022  AB, OTR/L  Acute Rehabilitation Services  Office: [phone]   Tristan Schroeder 12/13/2022, 5:18 PM

## 2022-12-13 NOTE — Progress Notes (Signed)
PROGRESS NOTE    Sheena [name]  HQI:[id number] DOB: January 07, 1946 DOA: 12/09/2022 PCP: [name], MD     Brief Narrative:  77 year old with history of small cell lung cancer status post chemoradiation, A-fib, HTN, HLD, osteoporosis with prior fragility fracture comes to the hospital with acute lower back pain.  CT angio was negative besides advanced emphysema.  CT of the lumbar spine suggested acute/subacute compression fracture of L1. PT/OT = SNF     Assessment & Plan:  Principal Problem:   Lumbar vertebral fracture (HCC)   Acute-subacute L1 vertebral compression fracture Acute fracture associated with 2 mm retropulsion without any neurologic deficits.  For now pain control, bowel regimen.  PT/OT-SNF P.o. medication and muscle relaxer seems to be controlling her pain.   Acute hypoxic respiratory failure History of emphysema Small cell lung cancer status post chemoradiation CTA is negative for PE but shows advanced emphysema.  Patient also has underlying lung malignancy.  Continue bronchodilators scheduled and as needed.  I-S/flutter valve.   Permanent atrial fibrillation Continue Lopressor and amiodarone.  Not on long-term anticoagulation   Hypertension  On metoprolol per above.  IV as needed   HLD  Continue home atorvastatin   PT/OT = SNF   DVT prophylaxis: Lovenox Code Status: DNR Family Communication:   On going pain control.  Will need SNF           Subjective:  No complaints drowsy as she had just received pain medicines.  Examination:  General exam: Appears calm and comfortable  Respiratory system: Clear to auscultation. Respiratory effort normal. Cardiovascular system: S1 & S2 heard, RRR. No JVD, murmurs, rubs, gallops or clicks. No pedal edema. Gastrointestinal system: Abdomen is nondistended, soft and nontender. No organomegaly or masses felt. Normal bowel sounds heard. Central nervous system: Drowsy but wakes up Extremities: Symmetric 5 x  5 power. Skin: No rashes, lesions or ulcers Psychiatry: Judgement and insight appear normal. Mood & affect appropriate.       Diet Orders (From admission, onward)     Start     Ordered   12/10/22 0329  Diet regular Room service appropriate? Yes; Fluid consistency: Thin  Diet effective now       Question Answer Comment  Room service appropriate? Yes   Fluid consistency: Thin      12/10/22 0333            Objective: Vitals:   12/12/22 1503 12/12/22 2029 12/13/22 0438 12/13/22 0737  BP: (!) 97/45 (!) 127/54 113/64 (!) 144/64  Pulse: 64 76 90 90  Resp: 16 18 18 18   Temp: 98.2 F (36.8 C) 97.7 F (36.5 C) 97.9 F (36.6 C) 97.6 F (36.4 C)  TempSrc: Oral Oral Oral Oral  SpO2: 95% 100% 97% 96%  Weight:        Intake/Output Summary (Last 24 hours) at 12/13/2022 0810 Last data filed at 12/12/2022 1700 Gross per 24 hour  Intake 480 ml  Output --  Net 480 ml   Filed Weights   12/10/22 0300  Weight: 44.5 kg    Scheduled Meds:  acetaminophen  1,000 mg Oral Q6H WA   amiodarone  200 mg Oral Daily   atorvastatin  20 mg Oral Daily   docusate sodium  100 mg Oral BID   enoxaparin (LOVENOX) injection  30 mg Subcutaneous Q24H   metoprolol tartrate  25 mg Oral BID   Continuous Infusions:  Nutritional status     Body mass index is 19.16 kg/m.  Data Reviewed:  CBC: Recent Labs  Lab 12/09/22 2152 12/11/22 0618 12/12/22 0442 12/13/22 0636  WBC 9.4 6.9 4.4 9.1  NEUTROABS 8.0*  --   --   --   HGB 10.8* 10.5* 9.8* 10.2*  HCT 33.5* 32.5* 30.9* 30.3*  MCV 103.4* 106.6* 104.0* 104.1*  PLT 111* 111* 108* 111*   Basic Metabolic Panel: Recent Labs  Lab 12/09/22 2152 12/11/22 0618 12/12/22 0442 12/13/22 0636  NA 134* 131*  --   --   K 4.2 4.9  --   --   CL 95* 92*  --   --   CO2 25 30  --   --   GLUCOSE 124* 105*  --   --   BUN 15 16  --   --   CREATININE 0.98 1.22*  --   --   CALCIUM 10.0 9.3  --   --   MG  --  2.0 2.1 1.9   GFR: Estimated  Creatinine Clearance: 28 mL/min (A) (by C-G formula based on SCr of 1.22 mg/dL (H)). Liver Function Tests: Recent Labs  Lab 12/09/22 2152  AST 24  ALT 15  ALKPHOS 77  BILITOT 1.0  PROT 7.0  ALBUMIN 3.2*   No results for input(s): "LIPASE", "AMYLASE" in the last 168 hours. No results for input(s): "AMMONIA" in the last 168 hours. Coagulation Profile: No results for input(s): "INR", "PROTIME" in the last 168 hours. Cardiac Enzymes: No results for input(s): "CKTOTAL", "CKMB", "CKMBINDEX", "TROPONINI" in the last 168 hours. BNP (last 3 results) No results for input(s): "PROBNP" in the last 8760 hours. HbA1C: No results for input(s): "HGBA1C" in the last 72 hours. CBG: No results for input(s): "GLUCAP" in the last 168 hours. Lipid Profile: No results for input(s): "CHOL", "HDL", "LDLCALC", "TRIG", "CHOLHDL", "LDLDIRECT" in the last 72 hours. Thyroid Function Tests: No results for input(s): "TSH", "T4TOTAL", "FREET4", "T3FREE", "THYROIDAB" in the last 72 hours. Anemia Panel: No results for input(s): "VITAMINB12", "FOLATE", "FERRITIN", "TIBC", "IRON", "RETICCTPCT" in the last 72 hours. Sepsis Labs: No results for input(s): "PROCALCITON", "LATICACIDVEN" in the last 168 hours.  Recent Results (from the past 240 hour(s))  SARS Coronavirus 2 by RT PCR (hospital order, performed in [facility] hospital lab) *cepheid single result test* Anterior Nasal Swab     Status: None   Collection Time: 12/09/22  9:52 PM   Specimen: Anterior Nasal Swab  Result Value Ref Range Status   SARS Coronavirus 2 by RT PCR NEGATIVE NEGATIVE Final    Comment: Performed at [facility], [address] [place], [place] [address]         Radiology Studies: No results found.         LOS: 0 days   Time spent= 35 mins    Miguel Rota, MD [facility]  If 7PM-7AM, please contact night-coverage  12/13/2022, 8:10 AM

## 2022-12-14 DIAGNOSIS — S32000D Wedge compression fracture of unspecified lumbar vertebra, subsequent encounter for fracture with routine healing: Secondary | ICD-10-CM | POA: Diagnosis not present

## 2022-12-14 MED ORDER — FAMOTIDINE 20 MG PO TABS
20.0000 mg | ORAL_TABLET | Freq: Once | ORAL | Status: AC
  Administered 2022-12-14: 20 mg via ORAL
  Filled 2022-12-14: qty 1

## 2022-12-14 NOTE — TOC Progression Note (Signed)
Transition of Care [facility]) - Progression Note    Patient Details  Name: Sheena [name] MRN: [id number] Date of Birth: 07-16-1945  Transition of Care [facility]) CM/SW Contact  Lorri Frederick, LCSW Phone Number: 12/14/2022, 2:16 PM  Clinical Narrative:   SNF auth request submitted in [place] and approved: [id number], 3 days: 10/15-10/17.     Expected Discharge Plan: Skilled Nursing Facility Barriers to Discharge: Continued Medical Work up, SNF Pending bed offer  Expected Discharge Plan and Services In-house Referral: Clinical Social Work   Post Acute Care Choice: Skilled Nursing Facility Living arrangements for the past 2 months: Single Family Home                                       Social Determinants of Health (SDOH) Interventions SDOH Screenings   Food Insecurity: No Food Insecurity (12/10/2022)  Housing: Patient Declined (12/10/2022)  Transportation Needs: Unmet Transportation Needs (12/10/2022)  Utilities: Not At Risk (12/10/2022)  Depression (PHQ2-9): Low Risk  (01/09/2022)  Financial Resource Strain: Low Risk  (01/09/2022)  Physical Activity: Inactive (01/09/2022)  Social Connections: Moderately Isolated (01/09/2022)  Stress: No Stress Concern Present (01/09/2022)  Tobacco Use: Medium Risk (12/09/2022)    Readmission Risk Interventions    10/09/2022    2:31 PM  Readmission Risk Prevention Plan  Transportation Screening Complete  PCP or Specialist Appt within 3-5 Days Complete  HRI or Home Care Consult Complete  Social Work Consult for Recovery Care Planning/Counseling Complete  Palliative Care Screening Not Applicable  Medication Review Oceanographer) Complete

## 2022-12-14 NOTE — Progress Notes (Signed)
PROGRESS NOTE    Sheena [name]  BJY:[id number] DOB: Jun 22, 1945 DOA: 12/09/2022 PCP: [name], MD     Brief Narrative:  77 year old with history of small cell lung cancer status post chemoradiation, A-fib, HTN, HLD, osteoporosis with prior fragility fracture comes to the hospital with acute lower back pain.  CT angio was negative besides advanced emphysema.  CT of the lumbar spine suggested acute/subacute compression fracture of L1. PT/OT = SNF.  Started on Remeron due to concerns of depression and lack of appetite.     Assessment & Plan:  Principal Problem:   Lumbar vertebral fracture (HCC)   Acute-subacute L1 vertebral compression fracture Acute fracture associated with 2 mm retropulsion without any neurologic deficits.  For now pain control, bowel regimen.  PT/OT-SNF P.o. medication and muscle relaxer seems to be controlling her pain.  IR consulted   Acute hypoxic respiratory failure History of emphysema Small cell lung cancer status post chemoradiation CTA is negative for PE but shows advanced emphysema.  Patient also has underlying lung malignancy.  Continue bronchodilators scheduled and as needed.  I-S/flutter valve.  Grieving - Secondary to recent loss of husband.  This is significantly hindering her care.  Seen by psychiatry.  On Remeron at bedtime, will adjust as needed.   Permanent atrial fibrillation Continue Lopressor and amiodarone.  Not on long-term anticoagulation   Hypertension  On metoprolol per above.  IV as needed   HLD  Continue home atorvastatin   PT/OT = SNF   DVT prophylaxis: Lovenox Code Status: DNR Family Communication: Daughter at bedside SNF placement           Subjective: Still having significant back pain limiting her mobility.    Examination:  General exam: Appears calm and comfortable; elderly frail  Respiratory system: Clear to auscultation. Respiratory effort normal. Cardiovascular system: S1 & S2 heard, RRR. No  JVD, murmurs, rubs, gallops or clicks. No pedal edema. Gastrointestinal system: Abdomen is nondistended, soft and nontender. No organomegaly or masses felt. Normal bowel sounds heard. Central nervous system: Alert and oriented. No focal neurological deficits. Extremities: Symmetric 5 x 5 power. Skin: No rashes, lesions or ulcers Psychiatry: Judgement and insight appear normal. Mood & affect appropriate.       Diet Orders (From admission, onward)     Start     Ordered   12/14/22 0930  Diet NPO time specified Except for: Sips with Meds  Diet effective now       Comments: For possible NIR procedure on 10.15.24  Question:  Except for  Answer:  Sips with Meds   12/14/22 0929            Objective: Vitals:   12/13/22 1528 12/13/22 2015 12/14/22 0431 12/14/22 0804  BP: (!) 95/54 (!) 119/56 (!) 129/58 (!) 161/61  Pulse: 63 67 (!) 59 75  Resp: 18 18 16 18   Temp: 97.9 F (36.6 C) 97.8 F (36.6 C) 98 F (36.7 C) 98.6 F (37 C)  TempSrc: Oral Oral Oral Oral  SpO2:  95% 99% 95%  Weight:       No intake or output data in the 24 hours ending 12/14/22 1122 Filed Weights   12/10/22 0300  Weight: 44.5 kg    Scheduled Meds:  acetaminophen  1,000 mg Oral Q6H WA   amiodarone  200 mg Oral Daily   atorvastatin  20 mg Oral Daily   docusate sodium  100 mg Oral BID   enoxaparin (LOVENOX) injection  30 mg Subcutaneous Q24H  feeding supplement  237 mL Oral TID BM   metoprolol tartrate  25 mg Oral BID   mirtazapine  7.5 mg Oral QHS   Continuous Infusions:  Nutritional status     Body mass index is 19.16 kg/m.  Data Reviewed:   CBC: Recent Labs  Lab 12/09/22 2152 12/11/22 0618 12/12/22 0442 12/13/22 0636  WBC 9.4 6.9 4.4 9.1  NEUTROABS 8.0*  --   --   --   HGB 10.8* 10.5* 9.8* 10.2*  HCT 33.5* 32.5* 30.9* 30.3*  MCV 103.4* 106.6* 104.0* 104.1*  PLT 111* 111* 108* 111*   Basic Metabolic Panel: Recent Labs  Lab 12/09/22 2152 12/11/22 0618 12/12/22 0442  12/13/22 0636  NA 134* 131*  --   --   K 4.2 4.9  --   --   CL 95* 92*  --   --   CO2 25 30  --   --   GLUCOSE 124* 105*  --   --   BUN 15 16  --   --   CREATININE 0.98 1.22*  --   --   CALCIUM 10.0 9.3  --   --   MG  --  2.0 2.1 1.9   GFR: Estimated Creatinine Clearance: 28 mL/min (A) (by C-G formula based on SCr of 1.22 mg/dL (H)). Liver Function Tests: Recent Labs  Lab 12/09/22 2152  AST 24  ALT 15  ALKPHOS 77  BILITOT 1.0  PROT 7.0  ALBUMIN 3.2*   No results for input(s): "LIPASE", "AMYLASE" in the last 168 hours. No results for input(s): "AMMONIA" in the last 168 hours. Coagulation Profile: No results for input(s): "INR", "PROTIME" in the last 168 hours. Cardiac Enzymes: No results for input(s): "CKTOTAL", "CKMB", "CKMBINDEX", "TROPONINI" in the last 168 hours. BNP (last 3 results) No results for input(s): "PROBNP" in the last 8760 hours. HbA1C: No results for input(s): "HGBA1C" in the last 72 hours. CBG: No results for input(s): "GLUCAP" in the last 168 hours. Lipid Profile: No results for input(s): "CHOL", "HDL", "LDLCALC", "TRIG", "CHOLHDL", "LDLDIRECT" in the last 72 hours. Thyroid Function Tests: No results for input(s): "TSH", "T4TOTAL", "FREET4", "T3FREE", "THYROIDAB" in the last 72 hours. Anemia Panel: No results for input(s): "VITAMINB12", "FOLATE", "FERRITIN", "TIBC", "IRON", "RETICCTPCT" in the last 72 hours. Sepsis Labs: No results for input(s): "PROCALCITON", "LATICACIDVEN" in the last 168 hours.  Recent Results (from the past 240 hour(s))  SARS Coronavirus 2 by RT PCR (hospital order, performed in [facility] hospital lab) *cepheid single result test* Anterior Nasal Swab     Status: None   Collection Time: 12/09/22  9:52 PM   Specimen: Anterior Nasal Swab  Result Value Ref Range Status   SARS Coronavirus 2 by RT PCR NEGATIVE NEGATIVE Final    Comment: Performed at [facility], [address] [place], [place] [address]          Radiology Studies: No results found.         LOS: 0 days   Time spent= 35 mins    Miguel Rota, MD [facility]  If 7PM-7AM, please contact night-coverage  12/14/2022, 11:22 AM

## 2022-12-14 NOTE — Plan of Care (Signed)

## 2022-12-14 NOTE — Progress Notes (Signed)
Chaplain responded to [facility] consult for prayer noting that [name]'s spouse had recently died. Chaplain introduced spiritual care and offered support in the setting of inpatient hospitalization. Chaplain asked open ended questions to facilitate emotional expression and story telling. Sheena [name] shared that the pain started when she felt her back pop as she stretched. She shared that her husband, Sheena [name], died just a day or two prior to her hospitalization. His death was sudden and shocking due to undiagnosed cancer.   Chaplain provided grief support and education through reflective listening. Chaplain normalized emotions and identified challenges as well sources of strength and coping strategies. Sheena [name] shared about her 20 years of marriage, her gratitude that someone prompted Sheena [name] to knock on her door and introduce himself, the love and support of their 4 children, including their son who died 10 years ago in a 2023-02-12 motorcycle accident. She will most miss Rick "fussing" at her and proudly shared about the couple's impact on the team at the Cancer center where Sheena [name] has been receiving treatment since January.   Our visit was interrupted when Sheena [name]'s family arrived and chaplain willingly offered to return at a later time as well as connect Sheena [name] with the unit chaplain for ongoing support.   Maryanna Shape. Carley Hammed, M.Div. [facility] Chaplain Pager [phone] Office [phone]

## 2022-12-14 NOTE — Progress Notes (Signed)
Physical Therapy Treatment Patient Details Name: Sheena [name] MRN: [id number] DOB: 1945/09/08 Today's Date: 12/14/2022   History of Present Illness 77 yo female presents to [facility] for back pain after hearing a "pop". Pt sustained acute L1 compression fx. Additional workup for acute hypoxic respiratory failure. PMH includes small cell lung cancer status post chemoradiation, afib, hypertension, hyperlipidemia, osteoporosis with prior fragility fracture.    PT Comments  Continuing work on functional mobility and activity tolerance;  Session focused on more upright activity and functional transfers, with very nice progress; Was premedicated for pain, and today, pt was able to sit up at EOB with mod assist, stand to RW, and take pivot steps bed to recliner with 2 person Mod assist; Still has mobility and ADL tasks to work on, and much better ability to participate; Overall progressing well this session; Hopeful for continuing good progress at post-acute rehabilitation.    If plan is discharge home, recommend the following: A lot of help with walking and/or transfers;A lot of help with bathing/dressing/bathroom   Can travel by private vehicle     No  Equipment Recommendations  Rolling walker (2 wheels);BSC/3in1;Other (comment) (Youth-sized RW)    Recommendations for Other Services       Precautions / Restrictions Precautions Precautions: Fall;Back Precaution Comments: Review back precautions Restrictions Weight Bearing Restrictions: No     Mobility  Bed Mobility Overal bed mobility: Needs Assistance Bed Mobility: Rolling, Sidelying to Sit Rolling: Min assist, Used rails Sidelying to sit: Mod assist       General bed mobility comments: Min assist and use of rails to roll tp pt's R side; cues to fully roll into sidelying; heavy mod assist to elevate trunk to sitting    Transfers Overall transfer level: Needs assistance Equipment used: Rolling walker (2 wheels) Transfers: Sit  to/from Stand, Bed to chair/wheelchair/BSC Sit to Stand: Mod assist, +2 safety/equipment   Step pivot transfers: Mod assist, +2 safety/equipment       General transfer comment: Cues for hand placement and safety; light mod assist to power up from EOB; Light mod assist to steady as pt took pivot steps bed to recliner; notably narrow step width, second person needed for rW control    Ambulation/Gait                   Stairs             Wheelchair Mobility     Tilt Bed    Modified Rankin (Stroke Patients Only)       Balance Overall balance assessment: Needs assistance   Sitting balance-Leahy Scale: Poor Sitting balance - Comments: near constant min assist     Standing balance-Leahy Scale: Poor                              Cognition Arousal: Alert Behavior During Therapy: WFL for tasks assessed/performed, Anxious Overall Cognitive Status: Within Functional Limits for tasks assessed (for simple mobility)                                          Exercises      General Comments General comments (skin integrity, edema, etc.): Daughter present for session and helpful; BP 114/62 end of session, HR 70; seated in recliner      Pertinent Vitals/Pain Pain Assessment Pain Assessment: Faces Faces Pain  Scale: Hurts little more Pain Location: low back Pain Descriptors / Indicators: Sore, Discomfort Pain Intervention(s): Monitored during session, Premedicated before session    Home Living                          Prior Function            PT Goals (current goals can now be found in the care plan section) Acute Rehab PT Goals Patient Stated Goal: to use the [facility] PT Goal Formulation: With patient Time For Goal Achievement: 12/24/22 Potential to Achieve Goals: Good Progress towards PT goals: Progressing toward goals    Frequency    Min 1X/week      PT Plan      Co-evaluation              AM-PAC PT  "6 Clicks" Mobility   Outcome Measure  Help needed turning from your back to your side while in a flat bed without using bedrails?: A Little Help needed moving from lying on your back to sitting on the side of a flat bed without using bedrails?: A Lot Help needed moving to and from a bed to a chair (including a wheelchair)?: A Lot Help needed standing up from a chair using your arms (e.g., wheelchair or bedside chair)?: A Lot Help needed to walk in hospital room?: Total Help needed climbing 3-5 steps with a railing? : Total 6 Click Score: 11    End of Session Equipment Utilized During Treatment: Gait belt (at axillae) Activity Tolerance: Patient tolerated treatment well Patient left: in chair;with call bell/phone within reach;with chair alarm set;with family/visitor present Nurse Communication: Mobility status PT Visit Diagnosis: Other abnormalities of gait and mobility (R26.89);Muscle weakness (generalized) (M62.81)     Time: 1610-9604 PT Time Calculation (min) (ACUTE ONLY): 22 min  Charges:    $Therapeutic Activity: 8-22 mins PT General Charges $$ ACUTE PT VISIT: 1 Visit                     Van Clines, PT  Acute Rehabilitation Services Office [phone] Secure Chat welcomed    Levi Aland 12/14/2022, 1:29 PM

## 2022-12-14 NOTE — Plan of Care (Signed)

## 2022-12-15 DIAGNOSIS — S32000D Wedge compression fracture of unspecified lumbar vertebra, subsequent encounter for fracture with routine healing: Secondary | ICD-10-CM | POA: Diagnosis not present

## 2022-12-15 LAB — PROTIME-INR
INR: 1 (ref 0.8–1.2)
Prothrombin Time: 13 s (ref 11.4–15.2)

## 2022-12-15 LAB — BASIC METABOLIC PANEL
Anion gap: 14 (ref 5–15)
BUN: 17 mg/dL (ref 8–23)
CO2: 25 mmol/L (ref 22–32)
Calcium: 9.4 mg/dL (ref 8.9–10.3)
Chloride: 98 mmol/L (ref 98–111)
Creatinine, Ser: 1.05 mg/dL — ABNORMAL HIGH (ref 0.44–1.00)
GFR, Estimated: 55 mL/min — ABNORMAL LOW (ref >60)
Glucose, Bld: 84 mg/dL (ref 70–99)
Potassium: 4.1 mmol/L (ref 3.5–5.1)
Sodium: 137 mmol/L (ref 135–145)

## 2022-12-15 LAB — CBC
HCT: 36.8 % (ref 36.0–46.0)
Hemoglobin: 11.5 g/dL — ABNORMAL LOW (ref 12.0–15.0)
MCH: 33.1 pg (ref 26.0–34.0)
MCHC: 31.3 g/dL (ref 30.0–36.0)
MCV: 106.1 fL — ABNORMAL HIGH (ref 80.0–100.0)
Platelets: 133 10*3/uL — ABNORMAL LOW (ref 150–400)
RBC: 3.47 MIL/uL — ABNORMAL LOW (ref 3.87–5.11)
RDW: 16 % — ABNORMAL HIGH (ref 11.5–15.5)
WBC: 5.9 10*3/uL (ref 4.0–10.5)
nRBC: 0 % (ref 0.0–0.2)

## 2022-12-15 LAB — MRSA NEXT GEN BY PCR, NASAL: MRSA by PCR Next Gen: NOT DETECTED

## 2022-12-15 MED ORDER — OXYCODONE HCL 5 MG PO TABS
5.0000 mg | ORAL_TABLET | Freq: Once | ORAL | Status: AC
  Administered 2022-12-15: 5 mg via ORAL
  Filled 2022-12-15: qty 1

## 2022-12-15 MED ORDER — LIDOCAINE 5 % EX PTCH
1.0000 | MEDICATED_PATCH | CUTANEOUS | Status: AC
  Administered 2022-12-15: 1 via TRANSDERMAL
  Filled 2022-12-15: qty 1

## 2022-12-15 MED ORDER — FAMOTIDINE 20 MG PO TABS
20.0000 mg | ORAL_TABLET | Freq: Two times a day (BID) | ORAL | Status: DC | PRN
  Administered 2022-12-15: 20 mg via ORAL
  Filled 2022-12-15: qty 1

## 2022-12-15 NOTE — Plan of Care (Signed)

## 2022-12-15 NOTE — Plan of Care (Signed)
Request to IR for possible L1 kyphoplasty due to intractable back pain not relieved with conservative measures. Of note previous T12 KP performed by Dr. Corliss Skains 2014.  Patient history and imaging reviewed by Dr. Corliss Skains who approves procedure. This procedure requires insurance authorization prior to proceeding - this has been submitted today and may take up to a week before a decision is made. Presented to patient's room to discuss however patient sleeping this afternoon.  Plan: - Restart diet today - NPO at midnight for potential procedure pending insurance approval - Hold Lovenox until post procedure, recommend heparin for DVT prophylaxis if indicated however will defer to primary team for this  (Lovenox requires 24 hour hold, heparin SQ requires 1 dose hold, heparin gtt requires 4-6 hour hold for IR procedures)  - IR will follow up with patient tomorrow for consult/consent  Lynnette Caffey, PA-C

## 2022-12-15 NOTE — Progress Notes (Signed)
   12/15/22 1356  Spiritual Encounters  Type of Visit Follow up;Attempt (pt unavailable)  Referral source Chaplain team  Reason for visit Routine spiritual support   Chaplain attempted follow-up visit on the suggestion of chaplain teammate.  Pt was not availabel as she was fast asleep and not easily roused.  Chaplain services remain available by Spiritual Consult or for emergent cases, paging [phone]  Chaplain Raelene Bott, MDiv [email or web address] [phone]

## 2022-12-15 NOTE — Progress Notes (Addendum)
OT Cancellation Note  Patient Details Name: Sheena [name] MRN: [id number] DOB: April 12, 1945   Cancelled Treatment:    Reason Eval/Treat Not Completed: (P) Patient declined, no reason specified. Pt states no pain at rest but when asked to participate Pt requested pain meds prior to therapy, currently scheduled for surgery later, will reattempt tomorrow.   Alexis Goodell 12/15/2022, 11:55 AM

## 2022-12-15 NOTE — Progress Notes (Signed)
PROGRESS NOTE    Sheena [name]  MWN:[id number] DOB: [birth year] DOA: 12/09/2022 PCP: [name], MD     Brief Narrative:  77 year old with history of small cell lung cancer status post chemoradiation, A-fib, HTN, HLD, osteoporosis with prior fragility fracture comes to the hospital with acute lower back pain.  CT angio was negative besides advanced emphysema.  CT of the lumbar spine suggested acute/subacute compression fracture of L1. PT/OT = SNF.  Started on Remeron due to concerns of depression and lack of appetite.  IR consulted secondary to uncontrolled back pain and patient scheduled for kyphoplasty.  Timing per interventional radiology     Assessment & Plan:  Principal Problem:   Lumbar vertebral fracture (HCC)   Acute-subacute L1 vertebral compression fracture Acute fracture associated with 2 mm retropulsion without any neurologic deficits.  For now pain control, bowel regimen.  PT/OT-SNF P.o. medication and muscle relaxer seems to be controlling her pain.  IR consulted, planning kyphoplasty .  Timing per interventional radiology   Acute hypoxic respiratory failure, improved History of emphysema Small cell lung cancer status post chemoradiation CTA is negative for PE but shows advanced emphysema.  Patient also has underlying lung malignancy.  Continue bronchodilators scheduled and as needed.  I-S/flutter valve.  Grieving - Secondary to recent loss of husband.  This is significantly hindering her care.  Seen by psychiatry.  On Remeron at bedtime, will adjust as needed.   Permanent atrial fibrillation Continue Lopressor and amiodarone.  Not on long-term anticoagulation   Hypertension  On metoprolol per above.  IV as needed   HLD  Continue home atorvastatin   PT/OT = SNF   DVT prophylaxis: Lovenox Code Status: DNR Family Communication: Daughter updated periodically Kyphoplasty, pain control thereafter likely SNF placement      Subjective: Still having back  pain limiting her mobility   Examination:  General exam: Appears calm and comfortable, elderly frail Respiratory system: Clear to auscultation. Respiratory effort normal. Cardiovascular system: S1 & S2 heard, RRR. No JVD, murmurs, rubs, gallops or clicks. No pedal edema. Gastrointestinal system: Abdomen is nondistended, soft and nontender. No organomegaly or masses felt. Normal bowel sounds heard. Central nervous system: Alert and oriented. No focal neurological deficits. Extremities: Symmetric 5 x 5 power. Skin: No rashes, lesions or ulcers Psychiatry: Judgement and insight appear normal. Mood & affect appropriate.       Diet Orders (From admission, onward)     Start     Ordered   12/14/22 0930  Diet NPO time specified Except for: Sips with Meds  Diet effective now       Comments: For possible NIR procedure on 10.15.24  Question:  Except for  Answer:  Sips with Meds   12/14/22 0929            Objective: Vitals:   12/14/22 2046 12/15/22 0550 12/15/22 0600 12/15/22 0723  BP: 123/64  (!) 171/70 (!) 118/59  Pulse: 71  75   Resp: 19  18 16   Temp: 98.5 F (36.9 C) 98 F (36.7 C) 98 F (36.7 C) 97.7 F (36.5 C)  TempSrc: Oral  Oral Oral  SpO2: 100%  96%   Weight:       No intake or output data in the 24 hours ending 12/15/22 1249 Filed Weights   12/10/22 0300  Weight: 44.5 kg    Scheduled Meds:  acetaminophen  1,000 mg Oral Q6H WA   amiodarone  200 mg Oral Daily   atorvastatin  20 mg Oral  Daily   docusate sodium  100 mg Oral BID   enoxaparin (LOVENOX) injection  30 mg Subcutaneous Q24H   feeding supplement  237 mL Oral TID BM   lidocaine  1 patch Transdermal Q24H   metoprolol tartrate  25 mg Oral BID   mirtazapine  7.5 mg Oral QHS   Continuous Infusions:  Nutritional status     Body mass index is 19.16 kg/m.  Data Reviewed:   CBC: Recent Labs  Lab 12/09/22 2152 12/11/22 0618 12/12/22 0442 12/13/22 0636 12/15/22 0622  WBC 9.4 6.9 4.4 9.1 5.9   NEUTROABS 8.0*  --   --   --   --   HGB 10.8* 10.5* 9.8* 10.2* 11.5*  HCT 33.5* 32.5* 30.9* 30.3* 36.8  MCV 103.4* 106.6* 104.0* 104.1* 106.1*  PLT 111* 111* 108* 111* 133*   Basic Metabolic Panel: Recent Labs  Lab 12/09/22 2152 12/11/22 0618 12/12/22 0442 12/13/22 0636 12/15/22 0622  NA 134* 131*  --   --  137  K 4.2 4.9  --   --  4.1  CL 95* 92*  --   --  98  CO2 25 30  --   --  25  GLUCOSE 124* 105*  --   --  84  BUN 15 16  --   --  17  CREATININE 0.98 1.22*  --   --  1.05*  CALCIUM 10.0 9.3  --   --  9.4  MG  --  2.0 2.1 1.9  --    GFR: Estimated Creatinine Clearance: 32.5 mL/min (A) (by C-G formula based on SCr of 1.05 mg/dL (H)). Liver Function Tests: Recent Labs  Lab 12/09/22 2152  AST 24  ALT 15  ALKPHOS 77  BILITOT 1.0  PROT 7.0  ALBUMIN 3.2*   No results for input(s): "LIPASE", "AMYLASE" in the last 168 hours. No results for input(s): "AMMONIA" in the last 168 hours. Coagulation Profile: Recent Labs  Lab 12/15/22 0622  INR 1.0   Cardiac Enzymes: No results for input(s): "CKTOTAL", "CKMB", "CKMBINDEX", "TROPONINI" in the last 168 hours. BNP (last 3 results) No results for input(s): "PROBNP" in the last 8760 hours. HbA1C: No results for input(s): "HGBA1C" in the last 72 hours. CBG: No results for input(s): "GLUCAP" in the last 168 hours. Lipid Profile: No results for input(s): "CHOL", "HDL", "LDLCALC", "TRIG", "CHOLHDL", "LDLDIRECT" in the last 72 hours. Thyroid Function Tests: No results for input(s): "TSH", "T4TOTAL", "FREET4", "T3FREE", "THYROIDAB" in the last 72 hours. Anemia Panel: No results for input(s): "VITAMINB12", "FOLATE", "FERRITIN", "TIBC", "IRON", "RETICCTPCT" in the last 72 hours. Sepsis Labs: No results for input(s): "PROCALCITON", "LATICACIDVEN" in the last 168 hours.  Recent Results (from the past 240 hour(s))  SARS Coronavirus 2 by RT PCR (hospital order, performed in [facility] lab) *cepheid single result test*  Anterior Nasal Swab     Status: None   Collection Time: 12/09/22  9:52 PM   Specimen: Anterior Nasal Swab  Result Value Ref Range Status   SARS Coronavirus 2 by RT PCR NEGATIVE NEGATIVE Final    Comment: Performed at [facility], [address], [place], [place] [address]  MRSA Next Gen by PCR, Nasal     Status: None   Collection Time: 12/15/22  8:25 AM   Specimen: Nasal Mucosa; Nasal Swab  Result Value Ref Range Status   MRSA by PCR Next Gen NOT DETECTED NOT DETECTED Final    Comment: (NOTE) The GeneXpert MRSA Assay (FDA approved  for NASAL specimens only), is one component of a comprehensive MRSA colonization surveillance program. It is not intended to diagnose MRSA infection nor to guide or monitor treatment for MRSA infections. Test performance is not FDA approved in patients less than 34 years old. Performed at [facility], [address] [place], [place] [address]          Radiology Studies: No results found.         LOS: 0 days   Time spent= 35 mins    Miguel Rota, MD [facility]  If 7PM-7AM, please contact night-coverage  12/15/2022, 12:49 PM

## 2022-12-16 ENCOUNTER — Observation Stay (HOSPITAL_COMMUNITY): Payer: 59

## 2022-12-16 ENCOUNTER — Encounter (HOSPITAL_COMMUNITY): Payer: Self-pay | Admitting: Internal Medicine

## 2022-12-16 DIAGNOSIS — M549 Dorsalgia, unspecified: Secondary | ICD-10-CM | POA: Diagnosis not present

## 2022-12-16 DIAGNOSIS — M545 Low back pain, unspecified: Secondary | ICD-10-CM

## 2022-12-16 DIAGNOSIS — J9601 Acute respiratory failure with hypoxia: Principal | ICD-10-CM

## 2022-12-16 DIAGNOSIS — Z85118 Personal history of other malignant neoplasm of bronchus and lung: Secondary | ICD-10-CM

## 2022-12-16 DIAGNOSIS — S32010A Wedge compression fracture of first lumbar vertebra, initial encounter for closed fracture: Secondary | ICD-10-CM | POA: Diagnosis not present

## 2022-12-16 DIAGNOSIS — R509 Fever, unspecified: Secondary | ICD-10-CM | POA: Diagnosis not present

## 2022-12-16 LAB — CBC
HCT: 32.5 % — ABNORMAL LOW (ref 36.0–46.0)
Hemoglobin: 10.6 g/dL — ABNORMAL LOW (ref 12.0–15.0)
MCH: 33.8 pg (ref 26.0–34.0)
MCHC: 32.6 g/dL (ref 30.0–36.0)
MCV: 103.5 fL — ABNORMAL HIGH (ref 80.0–100.0)
Platelets: 126 10*3/uL — ABNORMAL LOW (ref 150–400)
RBC: 3.14 MIL/uL — ABNORMAL LOW (ref 3.87–5.11)
RDW: 15.7 % — ABNORMAL HIGH (ref 11.5–15.5)
WBC: 15.4 10*3/uL — ABNORMAL HIGH (ref 4.0–10.5)
nRBC: 0 % (ref 0.0–0.2)

## 2022-12-16 LAB — BASIC METABOLIC PANEL
Anion gap: 10 (ref 5–15)
BUN: 30 mg/dL — ABNORMAL HIGH (ref 8–23)
CO2: 26 mmol/L (ref 22–32)
Calcium: 8.7 mg/dL — ABNORMAL LOW (ref 8.9–10.3)
Chloride: 95 mmol/L — ABNORMAL LOW (ref 98–111)
Creatinine, Ser: 1.19 mg/dL — ABNORMAL HIGH (ref 0.44–1.00)
GFR, Estimated: 48 mL/min — ABNORMAL LOW (ref >60)
Glucose, Bld: 169 mg/dL — ABNORMAL HIGH (ref 70–99)
Potassium: 4.4 mmol/L (ref 3.5–5.1)
Sodium: 131 mmol/L — ABNORMAL LOW (ref 135–145)

## 2022-12-16 MED ORDER — FENTANYL CITRATE (PF) 100 MCG/2ML IJ SOLN
INTRAMUSCULAR | Status: AC
  Filled 2022-12-16: qty 2

## 2022-12-16 MED ORDER — BUPIVACAINE HCL (PF) 0.25 % IJ SOLN
INTRAMUSCULAR | Status: AC
  Filled 2022-12-16: qty 30

## 2022-12-16 MED ORDER — TRAMADOL HCL 50 MG PO TABS
50.0000 mg | ORAL_TABLET | Freq: Four times a day (QID) | ORAL | Status: DC | PRN
  Administered 2022-12-17 – 2022-12-20 (×8): 50 mg via ORAL
  Filled 2022-12-16 (×8): qty 1

## 2022-12-16 MED ORDER — MIDAZOLAM HCL 2 MG/2ML IJ SOLN
INTRAMUSCULAR | Status: AC
  Filled 2022-12-16: qty 2

## 2022-12-16 MED ORDER — TOBRAMYCIN SULFATE 1.2 G IJ SOLR
INTRAMUSCULAR | Status: AC
  Filled 2022-12-16: qty 1.2

## 2022-12-16 MED ORDER — SODIUM CHLORIDE 0.9 % IV SOLN
1.0000 g | INTRAVENOUS | Status: DC
  Administered 2022-12-16 – 2022-12-20 (×5): 1 g via INTRAVENOUS
  Filled 2022-12-16 (×5): qty 10

## 2022-12-16 MED ORDER — CEFAZOLIN SODIUM-DEXTROSE 2-4 GM/100ML-% IV SOLN: 2.0000 g | INTRAVENOUS | Status: AC
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
## 2022-12-16 NOTE — Sedation Documentation (Signed)
Dr. Corliss Skains notified of the fever. Primary RN on 5N notified of the fever and that we will not be able to complete the procedure today. Primary RN notified that we did not treat the fever in IR

## 2022-12-16 NOTE — Progress Notes (Signed)
Occupational Therapy Treatment Patient Details Name: Sheena [name] MRN: [id number] DOB: 23-Jul-1945 Today's Date: 12/16/2022   History of present illness 77 yo female presents to [facility] for back pain after hearing a "pop". Pt sustained acute L1 compression fx. Additional workup for acute hypoxic respiratory failure. PMH includes small cell lung cancer status post chemoradiation, afib, hypertension, hyperlipidemia, osteoporosis with prior fragility fracture.   OT comments  Pt c/o pain with bed mobility and standing, 9/10 bursts of pain with movement. Pt eager to participate and improve but limited by pain. Pt appears mildly confused, repeatedly got my name wrong, difficulty with certain instructions, difficulty urinating, unsure if due to confusion or trouble initiating. Pt able to perform bed mobility with mod-max A for in/out of bed. Pt stood at bedside with mod A up to 10 seconds, not able to maintain due to 9/10 pain. Pt completed BUE exercises in bed, decreased follow through and carryover of learned exercises, displays significant weakness in BUEs. Pt would benefit from continued skilled therapy to maximize participation and manage pain, DC to postacute still appropriate.       If plan is discharge home, recommend the following:  A lot of help with walking and/or transfers;A lot of help with bathing/dressing/bathroom;Assistance with cooking/housework;Assist for transportation   Equipment Recommendations  None recommended by OT    Recommendations for Other Services      Precautions / Restrictions Precautions Precautions: Fall;Back Precaution Booklet Issued: Yes (comment) Precaution Comments: Review back precautions Restrictions Weight Bearing Restrictions: No       Mobility Bed Mobility Overal bed mobility: Needs Assistance Bed Mobility: Sidelying to Sit   Sidelying to sit: Mod assist, HOB elevated, Used rails Supine to sit: Max assist     General bed mobility comments: Pt  mod-max for in/out of bed with log roll    Transfers Overall transfer level: Needs assistance Equipment used: Rolling walker (2 wheels) Transfers: Sit to/from Stand Sit to Stand: Mod assist           General transfer comment: mod A for STS, did not attempt pivot due to pain     Balance Overall balance assessment: Needs assistance Sitting-balance support: Bilateral upper extremity supported, Feet supported Sitting balance-Leahy Scale: Poor Sitting balance - Comments: EOB, sitting with support of bed rail   Standing balance support: During functional activity, Reliant on assistive device for balance, Bilateral upper extremity supported Standing balance-Leahy Scale: Poor Standing balance comment: able to stand up to 10 seconds, 9/10 pain, with RW and mod A for support                           ADL either performed or assessed with clinical judgement   ADL Overall ADL's : Needs assistance/impaired                         Toilet Transfer: Maximal assistance;+2 for physical assistance;BSC/3in1   Toileting- Clothing Manipulation and Hygiene: Total assistance         General ADL Comments: stood at bedside to complete transfer, able to stand for 10 seconds, 9/10 pain at back with standing, may be able to pivot with max A but declined today.    Extremity/Trunk Assessment Upper Extremity Assessment Upper Extremity Assessment: Generalized weakness            Vision       Perception     Praxis      Cognition Arousal:  Alert Behavior During Therapy: WFL for tasks assessed/performed, Anxious Overall Cognitive Status: Within Functional Limits for tasks assessed                                 General Comments: Pt A/O x4, did miss the day of the month, able to recall date at end of session. Pt repeatedly got my name wrong, did seem mildly confused throughout session with certain tasks, hard to assess.        Exercises General  Exercises - Upper Extremity Shoulder Flexion: AROM, Theraband, Supine, Both, 10 reps Shoulder Horizontal ABduction: AROM, 10 reps, Supine, Theraband, Strengthening, Both Elbow Flexion: AROM, 10 reps, Both, Theraband Elbow Extension: AROM, Strengthening, Both, 10 reps, Theraband    Shoulder Instructions       General Comments      Pertinent Vitals/ Pain       Pain Assessment Pain Assessment: Faces Faces Pain Scale: Hurts whole lot Pain Location: low back Pain Descriptors / Indicators: Sore, Discomfort Pain Intervention(s): Monitored during session  Home Living                                          Prior Functioning/Environment              Frequency  Min 1X/week        Progress Toward Goals  OT Goals(current goals can now be found in the care plan section)  Progress towards OT goals: Progressing toward goals  Acute Rehab OT Goals Patient Stated Goal: to decrease pain OT Goal Formulation: With patient Time For Goal Achievement: 12/27/22 Potential to Achieve Goals: Good ADL Goals Pt Will Perform Grooming: with set-up;with supervision;sitting Pt Will Perform Lower Body Bathing: with set-up;with supervision;sitting/lateral leans;with adaptive equipment Pt Will Perform Lower Body Dressing: with supervision;with set-up;sitting/lateral leans;with adaptive equipment Pt/caregiver will Perform Home Exercise Program: Increased strength;Both right and left upper extremity;With theraband;Independently;With written HEP provided Additional ADL Goal #1: Pt will demonstrate 5 mins EOB sitting tolerance with CGA in preparation to complete seated ADLs Additional ADL Goal #2: Pt will complete log rolling with min A for sacral pressure relief and assist with toileting while bed level  Plan      Co-evaluation                 AM-PAC OT "6 Clicks" Daily Activity     Outcome Measure   Help from another person eating meals?: None Help from another  person taking care of personal grooming?: A Little Help from another person toileting, which includes using toliet, bedpan, or urinal?: Total Help from another person bathing (including washing, rinsing, drying)?: A Lot Help from another person to put on and taking off regular upper body clothing?: A Little Help from another person to put on and taking off regular lower body clothing?: Total 6 Click Score: 14    End of Session Equipment Utilized During Treatment: Gait belt;Rolling walker (2 wheels)  OT Visit Diagnosis: Other abnormalities of gait and mobility (R26.89);Muscle weakness (generalized) (M62.81);Pain   Activity Tolerance Patient limited by pain   Patient Left with call bell/phone within reach;in bed   Nurse Communication Mobility status        Time: 1027-2536 OT Time Calculation (min): 26 min  Charges: OT General Charges $OT Visit: 1 Visit OT Treatments $Self Care/Home Management :  8-22 mins $Therapeutic Exercise: 8-22 mins  Nazaiah Navarrete, OTR/L   Alexis Goodell 12/16/2022, 3:28 PM

## 2022-12-16 NOTE — Progress Notes (Addendum)
Progress Note   Patient: Sheena [name] YQM:[id number] DOB: 1945/03/28 DOA: 12/09/2022     0 DOS: the patient was seen and examined on 12/16/2022  Subjective: Patient seen and examined at bedside this morning According to nursing staff and family patient appears more confused today Said to have spike fever when she was taken to IR and as such procedure was aborted.  Patient was transferred back to the floor and I was notified. I immediately ordered blood cultures, UA, chest x-ray, as well as CBC and BMP for today.  Patient's family is concerned this could be related to the oxycodone as the last time she was given oxycodone when she was sleeping she began falling asleep.  I have discontinued oxycodone and placed the patient on tramadol as needed for pain Patient had received cefazolin in the morning before the planned IR procedure.  I will cover patient empirically with ceftriaxone awaiting lab results    Brief Narrative:  77 year old with history of small cell lung cancer status post chemoradiation, A-fib, HTN, HLD, osteoporosis with prior fragility fracture comes to the hospital with acute lower back pain.  CT angio was negative besides advanced emphysema.  CT of the lumbar spine suggested acute/subacute compression fracture of L1. PT/OT = SNF.  Started on Remeron due to concerns of depression and lack of appetite.  IR consulted secondary to uncontrolled back pain and patient scheduled for kyphoplasty.  Timing per interventional radiology     Assessment & Plan:  Principal Problem:   Lumbar vertebral fracture (HCC)   Fever of unclear etiology Acute metabolic encephalopathy rule out any infectious process Follow-up blood cultures, UA, chest x-ray, as well as CBC and BMP for today.  Patient's family is concerned this could be related to the oxycodone as the last time she was given oxycodone when she was sleeping she began falling asleep.  I have discontinued oxycodone and placed the patient on  tramadol as needed for pain Patient had received cefazolin in the morning before the planned IR procedure.   I will cover patient empirically with ceftriaxone awaiting lab results Monitor neurochecks closely  Acute-subacute L1 vertebral compression fracture Acute fracture associated with 2 mm retropulsion without any neurologic deficits.  For now pain control, bowel regimen.  PT/OT-SNF Patient awaiting IR kyphoplasty after fever resolves   Acute hypoxic respiratory failure, improved History of emphysema Small cell lung cancer status post chemoradiation CTA is negative for PE but shows advanced emphysema.  Patient also has underlying lung malignancy.   Continue bronchodilator therapy   Grieving - Secondary to recent loss of husband.  This is significantly hindering her care.  Seen by psychiatry.   Continue on Remeron at bedtime, will adjust as needed.   Permanent atrial fibrillation Continue Lopressor and amiodarone.  Not on long-term anticoagulation   Hypertension Continue on metoprolol per above.  IV as needed   HLD Continue home atorvastatin   PT/OT = SNF   DVT prophylaxis: Lovenox Code Status: DNR Family Communication: Patient's son updated Kyphoplasty, pain control thereafter likely SNF placement     Examination:   General exam: Patient seen laying in bed appears lethargic Respiratory system: Clear to auscultation. Respiratory effort normal. Cardiovascular system: S1 & S2 heard, RRR. No JVD, murmurs, rubs, gallops or clicks. No pedal edema. Gastrointestinal system: Abdomen is nondistended, soft and nontender. No organomegaly or masses felt. Normal bowel sounds heard. Central nervous system: To answer some questions but lethargic Extremities: Symmetric 5 x 5 power. Skin: No rashes, lesions or ulcers  Psychiatry: Depressed    Data Reviewed: I have personally reviewed patient chest x-ray obtained today which did not show any significant infiltrate, I have also reviewed  patient's lab reports as shown below, I have reviewed previous provider documentation as well as nursing documentation and radiologist documentation   Time spent: 55 minutes     Latest Ref Rng & Units 12/15/2022    6:22 AM 12/13/2022    6:36 AM 12/12/2022    4:42 AM  CBC  WBC 4.0 - 10.5 K/uL 5.9  9.1  4.4   Hemoglobin 12.0 - 15.0 g/dL 16.1  09.6  9.8   Hematocrit 36.0 - 46.0 % 36.8  30.3  30.9   Platelets 150 - 400 K/uL 133  111  108     Vitals:   12/16/22 1149 12/16/22 1150 12/16/22 1156 12/16/22 1215  BP:  123/69  (!) 124/53  Pulse: 73 72 73 72  Resp: 17 20  18   Temp: (!) 102.3 F (39.1 C)   99 F (37.2 C)  TempSrc: Rectal   Oral  SpO2: 98% 98% 97% 91%  Weight:          Latest Ref Rng & Units 12/15/2022    6:22 AM 12/11/2022    6:18 AM 12/09/2022    9:52 PM  CMP  Glucose 70 - 99 mg/dL 84  045  409   BUN 8 - 23 mg/dL 17  16  15    Creatinine 0.44 - 1.00 mg/dL 8.11  9.14  7.82   Sodium 135 - 145 mmol/L 137  131  134   Potassium 3.5 - 5.1 mmol/L 4.1  4.9  4.2   Chloride 98 - 111 mmol/L 98  92  95   CO2 22 - 32 mmol/L 25  30  25    Calcium 8.9 - 10.3 mg/dL 9.4  9.3  95.6   Total Protein 6.5 - 8.1 g/dL   7.0   Total Bilirubin 0.3 - 1.2 mg/dL   1.0   Alkaline Phos 38 - 126 U/L   77   AST 15 - 41 U/L   24   ALT 0 - 44 U/L   15      Author: Loyce Dys, MD 12/16/2022 2:23 PM  For on call review [email or web address].

## 2022-12-17 ENCOUNTER — Observation Stay (HOSPITAL_COMMUNITY): Payer: 59

## 2022-12-17 DIAGNOSIS — Z743 Need for continuous supervision: Secondary | ICD-10-CM | POA: Diagnosis not present

## 2022-12-17 DIAGNOSIS — Z923 Personal history of irradiation: Secondary | ICD-10-CM | POA: Diagnosis not present

## 2022-12-17 DIAGNOSIS — R2689 Other abnormalities of gait and mobility: Secondary | ICD-10-CM | POA: Diagnosis not present

## 2022-12-17 DIAGNOSIS — Z85118 Personal history of other malignant neoplasm of bronchus and lung: Secondary | ICD-10-CM | POA: Diagnosis not present

## 2022-12-17 DIAGNOSIS — Z87891 Personal history of nicotine dependence: Secondary | ICD-10-CM | POA: Diagnosis not present

## 2022-12-17 DIAGNOSIS — Z825 Family history of asthma and other chronic lower respiratory diseases: Secondary | ICD-10-CM | POA: Diagnosis not present

## 2022-12-17 DIAGNOSIS — Z8249 Family history of ischemic heart disease and other diseases of the circulatory system: Secondary | ICD-10-CM | POA: Diagnosis not present

## 2022-12-17 DIAGNOSIS — N390 Urinary tract infection, site not specified: Secondary | ICD-10-CM | POA: Diagnosis not present

## 2022-12-17 DIAGNOSIS — J439 Emphysema, unspecified: Secondary | ICD-10-CM | POA: Diagnosis not present

## 2022-12-17 DIAGNOSIS — R278 Other lack of coordination: Secondary | ICD-10-CM | POA: Diagnosis not present

## 2022-12-17 DIAGNOSIS — Z5982 Transportation insecurity: Secondary | ICD-10-CM | POA: Diagnosis not present

## 2022-12-17 DIAGNOSIS — E785 Hyperlipidemia, unspecified: Secondary | ICD-10-CM | POA: Diagnosis present

## 2022-12-17 DIAGNOSIS — R1311 Dysphagia, oral phase: Secondary | ICD-10-CM | POA: Diagnosis not present

## 2022-12-17 DIAGNOSIS — S32019A Unspecified fracture of first lumbar vertebra, initial encounter for closed fracture: Secondary | ICD-10-CM | POA: Diagnosis not present

## 2022-12-17 DIAGNOSIS — J9601 Acute respiratory failure with hypoxia: Secondary | ICD-10-CM | POA: Diagnosis not present

## 2022-12-17 DIAGNOSIS — M8088XA Other osteoporosis with current pathological fracture, vertebra(e), initial encounter for fracture: Secondary | ICD-10-CM | POA: Diagnosis not present

## 2022-12-17 DIAGNOSIS — Z886 Allergy status to analgesic agent status: Secondary | ICD-10-CM | POA: Diagnosis not present

## 2022-12-17 DIAGNOSIS — S32010A Wedge compression fracture of first lumbar vertebra, initial encounter for closed fracture: Secondary | ICD-10-CM | POA: Diagnosis not present

## 2022-12-17 DIAGNOSIS — M6281 Muscle weakness (generalized): Secondary | ICD-10-CM | POA: Diagnosis not present

## 2022-12-17 DIAGNOSIS — J9690 Respiratory failure, unspecified, unspecified whether with hypoxia or hypercapnia: Secondary | ICD-10-CM | POA: Diagnosis not present

## 2022-12-17 DIAGNOSIS — A419 Sepsis, unspecified organism: Secondary | ICD-10-CM | POA: Diagnosis not present

## 2022-12-17 DIAGNOSIS — S32009D Unspecified fracture of unspecified lumbar vertebra, subsequent encounter for fracture with routine healing: Secondary | ICD-10-CM | POA: Diagnosis not present

## 2022-12-17 DIAGNOSIS — I700 Atherosclerosis of aorta: Secondary | ICD-10-CM | POA: Diagnosis not present

## 2022-12-17 DIAGNOSIS — M810 Age-related osteoporosis without current pathological fracture: Secondary | ICD-10-CM | POA: Diagnosis present

## 2022-12-17 DIAGNOSIS — M4856XA Collapsed vertebra, not elsewhere classified, lumbar region, initial encounter for fracture: Secondary | ICD-10-CM | POA: Diagnosis present

## 2022-12-17 DIAGNOSIS — I4821 Permanent atrial fibrillation: Secondary | ICD-10-CM | POA: Diagnosis present

## 2022-12-17 DIAGNOSIS — Z66 Do not resuscitate: Secondary | ICD-10-CM | POA: Diagnosis present

## 2022-12-17 DIAGNOSIS — S32000A Wedge compression fracture of unspecified lumbar vertebra, initial encounter for closed fracture: Secondary | ICD-10-CM | POA: Diagnosis not present

## 2022-12-17 DIAGNOSIS — Z1152 Encounter for screening for COVID-19: Secondary | ICD-10-CM | POA: Diagnosis not present

## 2022-12-17 DIAGNOSIS — F419 Anxiety disorder, unspecified: Secondary | ICD-10-CM | POA: Diagnosis present

## 2022-12-17 DIAGNOSIS — M545 Low back pain, unspecified: Secondary | ICD-10-CM | POA: Diagnosis not present

## 2022-12-17 DIAGNOSIS — R64 Cachexia: Secondary | ICD-10-CM | POA: Diagnosis present

## 2022-12-17 DIAGNOSIS — Z79899 Other long term (current) drug therapy: Secondary | ICD-10-CM | POA: Diagnosis not present

## 2022-12-17 DIAGNOSIS — Z681 Body mass index (BMI) 19 or less, adult: Secondary | ICD-10-CM | POA: Diagnosis not present

## 2022-12-17 DIAGNOSIS — R0902 Hypoxemia: Secondary | ICD-10-CM | POA: Diagnosis not present

## 2022-12-17 DIAGNOSIS — I10 Essential (primary) hypertension: Secondary | ICD-10-CM | POA: Diagnosis present

## 2022-12-17 DIAGNOSIS — G9341 Metabolic encephalopathy: Secondary | ICD-10-CM | POA: Diagnosis not present

## 2022-12-17 DIAGNOSIS — Z9221 Personal history of antineoplastic chemotherapy: Secondary | ICD-10-CM | POA: Diagnosis not present

## 2022-12-17 DIAGNOSIS — M8088XD Other osteoporosis with current pathological fracture, vertebra(e), subsequent encounter for fracture with routine healing: Secondary | ICD-10-CM | POA: Diagnosis not present

## 2022-12-17 HISTORY — PX: IR KYPHO LUMBAR INC FX REDUCE BONE BX UNI/BIL CANNULATION INC/IMAGING: IMG5519

## 2022-12-17 HISTORY — PX: IR US GUIDE VASC ACCESS LEFT: IMG2389

## 2022-12-17 LAB — BASIC METABOLIC PANEL
Anion gap: 13 (ref 5–15)
BUN: 27 mg/dL — ABNORMAL HIGH (ref 8–23)
CO2: 28 mmol/L (ref 22–32)
Calcium: 9.4 mg/dL (ref 8.9–10.3)
Chloride: 96 mmol/L — ABNORMAL LOW (ref 98–111)
Creatinine, Ser: 1.01 mg/dL — ABNORMAL HIGH (ref 0.44–1.00)
GFR, Estimated: 58 mL/min — ABNORMAL LOW (ref >60)
Glucose, Bld: 100 mg/dL — ABNORMAL HIGH (ref 70–99)
Potassium: 4.3 mmol/L (ref 3.5–5.1)
Sodium: 137 mmol/L (ref 135–145)

## 2022-12-17 LAB — CBC
HCT: 31.2 % — ABNORMAL LOW (ref 36.0–46.0)
Hemoglobin: 10.1 g/dL — ABNORMAL LOW (ref 12.0–15.0)
MCH: 34.4 pg — ABNORMAL HIGH (ref 26.0–34.0)
MCHC: 32.4 g/dL (ref 30.0–36.0)
MCV: 106.1 fL — ABNORMAL HIGH (ref 80.0–100.0)
Platelets: 127 10*3/uL — ABNORMAL LOW (ref 150–400)
RBC: 2.94 MIL/uL — ABNORMAL LOW (ref 3.87–5.11)
RDW: 15.9 % — ABNORMAL HIGH (ref 11.5–15.5)
WBC: 8.4 10*3/uL (ref 4.0–10.5)
nRBC: 0 % (ref 0.0–0.2)

## 2022-12-17 LAB — URINALYSIS, ROUTINE W REFLEX MICROSCOPIC
Bacteria, UA: NONE SEEN
Bilirubin Urine: NEGATIVE
Glucose, UA: NEGATIVE mg/dL
Hgb urine dipstick: NEGATIVE
Ketones, ur: NEGATIVE mg/dL
Nitrite: NEGATIVE
Protein, ur: NEGATIVE mg/dL
Specific Gravity, Urine: 1.021 (ref 1.005–1.030)
pH: 5 (ref 5.0–8.0)

## 2022-12-17 MED ORDER — FENTANYL CITRATE (PF) 100 MCG/2ML IJ SOLN
INTRAMUSCULAR | Status: AC | PRN
Start: 2022-12-17 — End: 2022-12-17
  Administered 2022-12-17 (×4): 25 ug via INTRAVENOUS

## 2022-12-17 MED ORDER — BUPIVACAINE HCL (PF) 0.25 % IJ SOLN
INTRAMUSCULAR | Status: AC
  Filled 2022-12-17: qty 30

## 2022-12-17 MED ORDER — TOBRAMYCIN SULFATE 1.2 G IJ SOLR
1.2000 g | INTRAMUSCULAR | Status: AC
  Administered 2022-12-17: 1.2 g via TOPICAL
  Filled 2022-12-17: qty 1.2

## 2022-12-17 MED ORDER — IOHEXOL 300 MG/ML  SOLN
50.0000 mL | Freq: Once | INTRAMUSCULAR | Status: AC | PRN
  Administered 2022-12-17: 1 mL

## 2022-12-17 MED ORDER — CEFAZOLIN SODIUM-DEXTROSE 2-4 GM/100ML-% IV SOLN
INTRAVENOUS | Status: AC
  Filled 2022-12-17: qty 100

## 2022-12-17 MED ORDER — METOPROLOL TARTRATE 5 MG/5ML IV SOLN
INTRAVENOUS | Status: AC
  Filled 2022-12-17: qty 5

## 2022-12-17 MED ORDER — MIDAZOLAM HCL 2 MG/2ML IJ SOLN
INTRAMUSCULAR | Status: AC | PRN
Start: 2022-12-17 — End: 2022-12-17
  Administered 2022-12-17: .5 mg via INTRAVENOUS
  Administered 2022-12-17: 1 mg via INTRAVENOUS
  Administered 2022-12-17: .5 mg via INTRAVENOUS

## 2022-12-17 MED ORDER — METOPROLOL TARTRATE 5 MG/5ML IV SOLN
INTRAVENOUS | Status: AC | PRN
  Administered 2022-12-17: 5 mg via INTRAVENOUS

## 2022-12-17 MED ORDER — BUPIVACAINE HCL (PF) 0.25 % IJ SOLN
20.0000 mL | Freq: Once | INTRAMUSCULAR | Status: AC
  Administered 2022-12-17: 20 mL

## 2022-12-17 MED ORDER — HYDROMORPHONE HCL 1 MG/ML IJ SOLN
INTRAMUSCULAR | Status: AC
  Filled 2022-12-17: qty 1

## 2022-12-17 MED ORDER — FENTANYL CITRATE (PF) 100 MCG/2ML IJ SOLN
INTRAMUSCULAR | Status: AC
  Filled 2022-12-17: qty 2

## 2022-12-17 MED ORDER — LIDOCAINE HCL (PF) 1 % IJ SOLN
INTRAMUSCULAR | Status: AC
  Filled 2022-12-17: qty 30

## 2022-12-17 MED ORDER — MIDAZOLAM HCL 2 MG/2ML IJ SOLN
INTRAMUSCULAR | Status: AC
  Filled 2022-12-17: qty 2

## 2022-12-17 MED ORDER — TOBRAMYCIN SULFATE 1.2 G IJ SOLR
INTRAMUSCULAR | Status: AC
  Filled 2022-12-17: qty 1.2

## 2022-12-17 MED ORDER — ENOXAPARIN SODIUM 30 MG/0.3ML IJ SOSY
30.0000 mg | PREFILLED_SYRINGE | INTRAMUSCULAR | Status: DC
  Administered 2022-12-18 – 2022-12-21 (×4): 30 mg via SUBCUTANEOUS
  Filled 2022-12-17 (×4): qty 0.3

## 2022-12-17 MED ORDER — HYDROMORPHONE HCL 1 MG/ML IJ SOLN
INTRAMUSCULAR | Status: AC | PRN
Start: 2022-12-17 — End: 2022-12-17
  Administered 2022-12-17: .5 mg via INTRAVENOUS

## 2022-12-17 MED ORDER — ENOXAPARIN SODIUM 30 MG/0.3ML IJ SOSY: 30.0000 mg | PREFILLED_SYRINGE | INTRAMUSCULAR | Status: DC

## 2022-12-17 NOTE — Discharge Instructions (Addendum)
   1.  No stooping, or bending, or lifting weights  above 10 pounds for 2 weeks.  2.  Use a walker to ambulate for 2 weeks or  longer as needed.  3.  No driving for 2 weeks.  4.  See referring MD as needed in 2 weeks.

## 2022-12-17 NOTE — Progress Notes (Signed)
Progress Note   Patient: Sheena [name] [id number] DOB: Sep 12, 1945 DOA: 12/09/2022     0 DOS: the patient was seen and examined on 12/17/2022    Subjective: Patient seen and examined at bedside Underwent IR guided L1 balloon kyphoplasty today Denies nausea vomiting abdominal pain or chest pain    Brief Narrative:  77 year old with history of small cell lung cancer status post chemoradiation, A-fib, HTN, HLD, osteoporosis with prior fragility fracture comes to the hospital with acute lower back pain.  CT angio was negative besides advanced emphysema.  CT of the lumbar spine suggested acute/subacute compression fracture of L1. PT/OT = SNF.  Started on Remeron due to concerns of depression and lack of appetite.  IR consulted secondary to uncontrolled back pain and patient scheduled for kyphoplasty.  Timing per interventional radiology     Assessment & Plan:  Principal Problem:   Lumbar vertebral fracture (HCC)     Sepsis likely secondary to possible UTI Urinalysis showed some pyuria, sample was taken after initiation of antibiotics Patient spiked temperature to 102.2 in the setting of leukocytosis Continue antibiotics to complete 5 days course Follow-up on blood culture results Chest x-ray reviewed by me did not show any infiltrates   Acute metabolic encephalopathy r secondary to possible UTI-improved Continue above management  Acute-subacute L1 vertebral compression fracture Acute fracture associated with 2 mm retropulsion without any neurologic deficits.  For now pain control, bowel regimen.  PT/OT-SNF Status post IR guided kyphoplasty on 12/17/2022   Acute hypoxic respiratory failure, improved History of emphysema Small cell lung cancer status post chemoradiation CTA is negative for PE but shows advanced emphysema.  Patient also has underlying lung malignancy.   Will continue bronchodilator therapy   Grieving - Secondary to recent loss of husband.  This is  significantly hindering her care.  Seen by psychiatry.   Continue on Remeron at bedtime, will adjust as needed.   Permanent atrial fibrillation Continue Lopressor and amiodarone.  Not on long-term anticoagulation   Hypertension Continue on metoprolol per above.  IV as needed   HLD Continue home atorvastatin   PT/OT = SNF   DVT prophylaxis: Lovenox Code Status: DNR Family Communication: Patient's son updated Kyphoplasty, pain control thereafter likely SNF placement      Examination:   General exam: Laying in bed awake in no distress Respiratory system: Clear to auscultation. Respiratory effort normal. Cardiovascular system: S1 & S2 heard, RRR. No JVD, murmurs, rubs, gallops or clicks. No pedal edema. Gastrointestinal system: Abdomen is nondistended, soft and nontender. No organomegaly or masses felt. Normal bowel sounds heard. Central nervous system: To answer some questions but lethargic Extremities: Symmetric 5 x 5 power. Skin: No rashes, lesions or ulcers Psychiatry: Depressed      Data Reviewed: Have reviewed patient's labs below as well as nursing documentation, [facility] manager documentation as well as urinalysis reports and patient's vitals, I have also reviewed interventional radiology documentation   Time spent: 45 minutes    Vitals:   12/17/22 1105 12/17/22 1110 12/17/22 1125 12/17/22 1303  BP: 133/77 127/69 133/78 125/64  Pulse: (!) 108 (!) 115 (!) 107 78  Resp: 20 20 20    Temp:    97.8 F (36.6 C)  TempSrc:      SpO2: 98% 99% 99% 91%  Weight:          Latest Ref Rng & Units 12/17/2022    7:11 AM 12/16/2022    4:23 PM 12/15/2022    6:22 AM  BMP  Glucose  70 - 99 mg/dL 811  914  84   BUN 8 - 23 mg/dL 27  30  17    Creatinine 0.44 - 1.00 mg/dL 7.82  9.56  2.13   Sodium 135 - 145 mmol/L 137  131  137   Potassium 3.5 - 5.1 mmol/L 4.3  4.4  4.1   Chloride 98 - 111 mmol/L 96  95  98   CO2 22 - 32 mmol/L 28  26  25    Calcium 8.9 - 10.3 mg/dL 9.4  8.7  9.4         Latest Ref Rng & Units 12/17/2022    7:11 AM 12/16/2022    4:23 PM 12/15/2022    6:22 AM  CBC  WBC 4.0 - 10.5 K/uL 8.4  15.4  5.9   Hemoglobin 12.0 - 15.0 g/dL 08.6  57.8  46.9   Hematocrit 36.0 - 46.0 % 31.2  32.5  36.8   Platelets 150 - 400 K/uL 127  126  133      Author: Loyce Dys, MD 12/17/2022 3:43 PM  For on call review [email or web address].

## 2022-12-17 NOTE — Progress Notes (Signed)
    Pt was scheduled for L1 KP in IR yesterday Determined to have temp 102 (rectal). Sent back to room Cannot perform percutaneous procedure with possible infection  UA and BC pending this am Afeb Wbc 15.4 this am  Plan:  will await all lab results.           Wbc will need to be wnl to safely proceed           Pt will be on IR Radar --- will move ahead when safe to do so.

## 2022-12-17 NOTE — Progress Notes (Signed)
PT Cancellation Note  Patient Details Name: Sheena [name] MRN: [id number] DOB: [birth year]   Cancelled Treatment:    Reason Eval/Treat Not Completed: Patient at procedure or test/unavailable, pt off unit for kyphoplasty this morning. PT will continue to follow and re-evaluate after procedure.   Vickki Muff, PT, DPT   Acute Rehabilitation Department Office [phone] Secure Chat Communication Preferred   Ronnie Derby 12/17/2022, 11:55 AM

## 2022-12-17 NOTE — Procedures (Signed)
INR.  Status post L1 balloon kyphoplasty  Right transpedicular approach.  Patient tolerated the  procedure well.  No acute complications.    Fatima Sanger MD.

## 2022-12-17 NOTE — Plan of Care (Signed)

## 2022-12-17 NOTE — Plan of Care (Signed)

## 2022-12-17 NOTE — Sedation Documentation (Addendum)
MD aware of HR 120. No new med orders placed. Would like [place] to reevaluate patient HR once she is back to bed + supine.

## 2022-12-17 NOTE — TOC Progression Note (Signed)
Transition of Care [facility]) - Progression Note    Patient Details  Name: Sheena [name] MRN: [id number] Date of Birth: May 13, 1945  Transition of Care [facility] CM/SW Contact  Lorri Frederick, LCSW Phone Number: 12/17/2022, 3:32 PM  Clinical Narrative:   Pt SNF auth expires today, will need new Auth prior to going to [place].  Will need new PT note, PT aware.  CSW spoke with daughter [name], updated her that still planning for DC to [facility] once pt is ready.     Expected Discharge Plan: Skilled Nursing Facility Barriers to Discharge: Continued Medical Work up, SNF Pending bed offer  Expected Discharge Plan and Services In-house Referral: Clinical Social Work   Post Acute Care Choice: Skilled Nursing Facility Living arrangements for the past 2 months: Single Family Home                                       Social Determinants of Health (SDOH) Interventions SDOH Screenings   Food Insecurity: No Food Insecurity (12/10/2022)  Housing: Patient Declined (12/10/2022)  Transportation Needs: Unmet Transportation Needs (12/10/2022)  Utilities: Not At Risk (12/10/2022)  Depression (PHQ2-9): Low Risk  (01/09/2022)  Financial Resource Strain: Low Risk  (01/09/2022)  Physical Activity: Inactive (01/09/2022)  Social Connections: Moderately Isolated (01/09/2022)  Stress: No Stress Concern Present (01/09/2022)  Tobacco Use: Medium Risk (12/16/2022)    Readmission Risk Interventions    10/09/2022    2:31 PM  Readmission Risk Prevention Plan  Transportation Screening Complete  PCP or Specialist Appt within 3-5 Days Complete  HRI or Home Care Consult Complete  Social Work Consult for Recovery Care Planning/Counseling Complete  Palliative Care Screening Not Applicable  Medication Review [employer]) Complete

## 2022-12-18 DIAGNOSIS — M545 Low back pain, unspecified: Secondary | ICD-10-CM | POA: Diagnosis not present

## 2022-12-18 DIAGNOSIS — S32010A Wedge compression fracture of first lumbar vertebra, initial encounter for closed fracture: Secondary | ICD-10-CM | POA: Diagnosis not present

## 2022-12-18 DIAGNOSIS — J9601 Acute respiratory failure with hypoxia: Secondary | ICD-10-CM | POA: Diagnosis not present

## 2022-12-18 DIAGNOSIS — Z85118 Personal history of other malignant neoplasm of bronchus and lung: Secondary | ICD-10-CM | POA: Diagnosis not present

## 2022-12-18 LAB — CBC
HCT: 31.2 % — ABNORMAL LOW (ref 36.0–46.0)
Hemoglobin: 9.9 g/dL — ABNORMAL LOW (ref 12.0–15.0)
MCH: 34.3 pg — ABNORMAL HIGH (ref 26.0–34.0)
MCHC: 31.7 g/dL (ref 30.0–36.0)
MCV: 108 fL — ABNORMAL HIGH (ref 80.0–100.0)
Platelets: 131 10*3/uL — ABNORMAL LOW (ref 150–400)
RBC: 2.89 MIL/uL — ABNORMAL LOW (ref 3.87–5.11)
RDW: 15.8 % — ABNORMAL HIGH (ref 11.5–15.5)
WBC: 6.1 10*3/uL (ref 4.0–10.5)
nRBC: 0 % (ref 0.0–0.2)

## 2022-12-18 LAB — BASIC METABOLIC PANEL
Anion gap: 8 (ref 5–15)
BUN: 22 mg/dL (ref 8–23)
CO2: 29 mmol/L (ref 22–32)
Calcium: 8.7 mg/dL — ABNORMAL LOW (ref 8.9–10.3)
Chloride: 95 mmol/L — ABNORMAL LOW (ref 98–111)
Creatinine, Ser: 0.93 mg/dL (ref 0.44–1.00)
GFR, Estimated: >60 mL/min (ref >60)
Glucose, Bld: 97 mg/dL (ref 70–99)
Potassium: 4.4 mmol/L (ref 3.5–5.1)
Sodium: 132 mmol/L — ABNORMAL LOW (ref 135–145)

## 2022-12-18 NOTE — Plan of Care (Signed)

## 2022-12-18 NOTE — Care Management Important Message (Signed)
Important Message  Patient Details  Name: Ronecia Sade MRN: [id number] Date of Birth: 1946-02-07   Important Message Given:  Yes - Medicare IM     Sherilyn Banker 12/18/2022, 1:59 PM

## 2022-12-18 NOTE — Progress Notes (Signed)
Physical Therapy Re-Evaluation and Treatment Patient Details Name: Sheena [name] MRN: [id number] DOB: March 21, 1945 Today's Date: 12/18/2022  History of Present Illness  77 yo female presents to [facility] for back pain after hearing a "pop". Pt sustained acute L1 compression fx. Additional workup for acute hypoxic respiratory failure. s/p 10/17 L1 kyphoplasty PMH includes small cell lung cancer status post chemoradiation, afib, hypertension, hyperlipidemia, osteoporosis with prior fragility fracture.  Clinical Impression  Pt with less pain and more mobility since kyphoplasty yesterday. Pt apprehensive about getting up and walking with therapy, but eventually convinced to get up from bed. Pt requires min-modA for bed mobility, minAx2 for transfers and min A for very short bout of ambulation. D/c plans remain appropriate at this time. PT will continue to follow acutely.        If plan is discharge home, recommend the following: A lot of help with walking and/or transfers;A lot of help with bathing/dressing/bathroom   Can travel by private vehicle   No    Equipment Recommendations Rolling walker (2 wheels);BSC/3in1;Other (comment) (Youth-sized RW)     Functional Status Assessment Patient has had a recent decline in their functional status and demonstrates the ability to make significant improvements in function in a reasonable and predictable amount of time.     Precautions / Restrictions Precautions Precautions: Fall;Back Precaution Booklet Issued: Yes (comment) Precaution Comments: Reviewed back precautions Restrictions Weight Bearing Restrictions: No      Mobility  Bed Mobility Overal bed mobility: Needs Assistance Bed Mobility: Sidelying to Sit, Rolling Rolling: Used rails, Contact guard assist Sidelying to sit: HOB elevated, Used rails, Min assist       General bed mobility comments: able to roll onto R side with contact guard, min A for bringing trunk to upright and pad scoot of  hips to EoB    Transfers Overall transfer level: Needs assistance Equipment used: Rolling walker (2 wheels) Transfers: Sit to/from Stand Sit to Stand: Min assist, +2 physical assistance, From elevated surface   Step pivot transfers: Mod assist       General transfer comment: minAx2 for coming to standing at EoB from elevated surface, increased anxiety about getting up to chair, but able to pivot with modA    Ambulation/Gait Ambulation/Gait assistance: Min assist Gait Distance (Feet): 8 Feet Assistive device: Rolling walker (2 wheels) Gait Pattern/deviations: Step-to pattern, Decreased step length - right, Decreased step length - left, Trunk flexed, Shuffle       General Gait Details: minA for slowed shuffling gait vc for proximity to RW and upright posture, close chair follow and pt with increased cuing for safely sitting in chair         Balance Overall balance assessment: Needs assistance Sitting-balance support: Bilateral upper extremity supported, Feet supported Sitting balance-Leahy Scale: Poor Sitting balance - Comments: EOB,   Standing balance support: During functional activity, Reliant on assistive device for balance, Bilateral upper extremity supported Standing balance-Leahy Scale: Poor Standing balance comment: requires UE support to maintain balance                             Pertinent Vitals/Pain Pain Assessment Pain Assessment: Faces Faces Pain Scale: Hurts a little bit Pain Location: low back Pain Descriptors / Indicators: Sore, Discomfort Pain Intervention(s): Limited activity within patient's tolerance, Monitored during session, Repositioned    Home Living Family/patient expects to be discharged to:: Private residence Living Arrangements: Spouse/significant other Available Help at Discharge: Family;Available 24 hours/day  Type of Home: House Home Access: Stairs to enter Entrance Stairs-Rails: Right Entrance Stairs-Number of Steps:  3 Alternate Level Stairs-Number of Steps: 14 steps Home Layout: Two level;Bed/bath upstairs Home Equipment: Rolling Walker (2 wheels)      Prior Function Prior Level of Function : Needs assist             Mobility Comments: pt reports needing a cane, but does not use AD. ADLs Comments: pt reports independence with ADLs, pt's husband would help as needed but is in the hospital on "life support for cancer, it's not looking good"     Extremity/Trunk Assessment   Upper Extremity Assessment Upper Extremity Assessment: Generalized weakness    Lower Extremity Assessment Lower Extremity Assessment: Generalized weakness    Cervical / Trunk Assessment Cervical / Trunk Assessment: Kyphotic  Communication   Communication Communication: No apparent difficulties  Cognition Arousal: Alert Behavior During Therapy: WFL for tasks assessed/performed, Anxious Overall Cognitive Status: Within Functional Limits for tasks assessed                                 General Comments: trembling with coming to EoB, poor short term memory, outlined what treatment would look like today, and pt repeatedly asked if she was going to stay up in the chair        General Comments General comments (skin integrity, edema, etc.): VSS on RA        Assessment/Plan    PT Assessment Patient needs continued PT services  PT Problem List Decreased strength;Decreased mobility;Decreased activity tolerance;Decreased balance;Decreased knowledge of use of DME;Pain;Cardiopulmonary status limiting activity;Decreased safety awareness       PT Treatment Interventions DME instruction;Therapeutic activities;Gait training;Therapeutic exercise;Patient/family education;Balance training;Stair training;Neuromuscular re-education;Functional mobility training    PT Goals (Current goals can be found in the Care Plan section)  Acute Rehab PT Goals PT Goal Formulation: With patient Time For Goal Achievement:  12/24/22 Potential to Achieve Goals: Good    Frequency Min 1X/week        AM-PAC PT "6 Clicks" Mobility  Outcome Measure Help needed turning from your back to your side while in a flat bed without using bedrails?: A Little Help needed moving from lying on your back to sitting on the side of a flat bed without using bedrails?: A Lot Help needed moving to and from a bed to a chair (including a wheelchair)?: A Lot Help needed standing up from a chair using your arms (e.g., wheelchair or bedside chair)?: A Lot Help needed to walk in hospital room?: Total Help needed climbing 3-5 steps with a railing? : Total 6 Click Score: 11    End of Session Equipment Utilized During Treatment: Gait belt Activity Tolerance: Patient tolerated treatment well Patient left: in chair;with call bell/phone within reach;with chair alarm set Nurse Communication: Mobility status PT Visit Diagnosis: Other abnormalities of gait and mobility (R26.89);Muscle weakness (generalized) (M62.81)    Time: 1610-9604 PT Time Calculation (min) (ACUTE ONLY): 23 min   Charges:   PT Evaluation $PT Re-evaluation: 1 Re-eval PT Treatments $Therapeutic Activity: 8-22 mins PT General Charges $$ ACUTE PT VISIT: 1 Visit         Ranier Coach B. Beverely Risen PT, DPT Acute Rehabilitation Services Please use secure chat or  Call Office [phone]   Elon Alas [facility] 12/18/2022, 3:26 PM

## 2022-12-18 NOTE — TOC Progression Note (Signed)
Transition of Care [facility]) - Progression Note    Patient Details  Name: Concepcion Mcgurrin MRN: [id number] Date of Birth: 1945-05-08  [facility] CM/SW Contact  Lorri Frederick, LCSW Phone Number: 12/18/2022, 3:56 PM  Clinical Narrative:   New PT note in.  Auth request submitted in [place].      Expected Discharge Plan: Skilled Nursing Facility Barriers to Discharge: Continued Medical Work up, SNF Pending bed offer  Expected Discharge Plan and Services In-house Referral: Clinical Social Work   Post Acute Care Choice: Skilled Nursing Facility Living arrangements for the past 2 months: Single Family Home                                       Social Determinants of Health (SDOH) Interventions SDOH Screenings   Food Insecurity: No Food Insecurity (12/10/2022)  Housing: Patient Declined (12/10/2022)  Transportation Needs: Unmet Transportation Needs (12/10/2022)  Utilities: Not At Risk (12/10/2022)  Depression (PHQ2-9): Low Risk  (01/09/2022)  Financial Resource Strain: Low Risk  (01/09/2022)  Physical Activity: Inactive (01/09/2022)  Social Connections: Moderately Isolated (01/09/2022)  Stress: No Stress Concern Present (01/09/2022)  Tobacco Use: Medium Risk (12/16/2022)    Readmission Risk Interventions    10/09/2022    2:31 PM  Readmission Risk Prevention Plan  Transportation Screening Complete  PCP or Specialist Appt within 3-5 Days Complete  HRI or Home Care Consult Complete  Social Work Consult for Recovery Care Planning/Counseling Complete  Palliative Care Screening Not Applicable  Medication Review [employer]) Complete

## 2022-12-18 NOTE — Progress Notes (Signed)
Progress Note   Patient: Sheena [name] MWN:[id number] DOB: 12-17-1945 DOA: 12/09/2022     1 DOS: the patient was seen and examined on 12/18/2022     Subjective: Patient seen and examined at bedside this morning Denies nausea vomiting abdominal pain no chest pain She tells me her back pain is slightly better [facility] manager working on placement    Brief Narrative:  77 year old with history of small cell lung cancer status post chemoradiation, A-fib, HTN, HLD, osteoporosis with prior fragility fracture comes to the hospital with acute lower back pain.  CT angio was negative besides advanced emphysema.  CT of the lumbar spine suggested acute/subacute compression fracture of L1. PT/OT = SNF.  Started on Remeron due to concerns of depression and lack of appetite.  Underwent IR guided L1 balloon kyphoplasty on 12/17/2022     Assessment & Plan:  Principal Problem:   Lumbar vertebral fracture (HCC)       Sepsis likely secondary to possible UTI-improved Urinalysis showed some pyuria, sample was taken after initiation of antibiotics Patient spiked temperature to 102.2 in the setting of leukocytosis Continue antibiotics to complete 5 days course Follow-up on culture results Chest x-ray reviewed by me did not show any infiltrates     Acute metabolic encephalopathy r secondary to possible UTI-improved Continue above management  Acute-subacute L1 vertebral compression fracture Acute fracture associated with 2 mm retropulsion without any neurologic deficits.  For now pain control, bowel regimen.  PT/OT-SNF Status post IR guided kyphoplasty on 12/17/2022 [facility] manager working on placement   Acute hypoxic respiratory failure, improved History of emphysema Small cell lung cancer status post chemoradiation CTA is negative for PE but shows advanced emphysema.  Patient also has underlying lung malignancy.   Continue bronchodilator therapy   Grieving - Secondary to recent loss of husband.  This  is significantly hindering her care.  Seen by psychiatry.   Continue on Remeron at bedtime, will adjust as needed.   Permanent atrial fibrillation Continue Lopressor and amiodarone.  Not on long-term anticoagulation   Hypertension Chronic continue on metoprolol per above.  IV as needed   HLD  continue atorvastatin   PT/OT = SNF   DVT prophylaxis: Lovenox Code Status: DNR Family Communication: Patient's son updated  Disposition: Pending skilled nursing facility placement     Examination:   General exam: Laying in bed in no acute distress Respiratory system: Clear to auscultation. Respiratory effort normal. Cardiovascular system: S1 & S2 heard, RRR. No JVD, murmurs, rubs, gallops or clicks. No pedal edema. Gastrointestinal system: Abdomen is nondistended, soft and nontender. No organomegaly or masses felt. Normal bowel sounds heard. Central nervous system: To answer some questions but lethargic Extremities: Symmetric 5 x 5 power. Skin: No rashes, lesions or ulcers Psychiatry: Depressed      Data Reviewed: I have reviewed patient's lab reports, vitals, as shown below I have also reviewed transition of care manager documentation as well as nursing documentation   Time spent: 40 minutes       Vitals:   12/18/22 0503 12/18/22 0759 12/18/22 1500 12/18/22 1515  BP: (!) 149/44 (!) 120/50 (!) 93/47 100/61  Pulse: 62 61 69   Resp: 18 18    Temp: 98.2 F (36.8 C) 97.9 F (36.6 C)    TempSrc: Oral Oral Oral   SpO2: 94% 100% 95%   Weight:          Latest Ref Rng & Units 12/18/2022    8:11 AM 12/17/2022    7:11 AM  12/16/2022    4:23 PM  BMP  Glucose 70 - 99 mg/dL 97  161  096   BUN 8 - 23 mg/dL 22  27  30    Creatinine 0.44 - 1.00 mg/dL 0.45  4.09  8.11   Sodium 135 - 145 mmol/L 132  137  131   Potassium 3.5 - 5.1 mmol/L 4.4  4.3  4.4   Chloride 98 - 111 mmol/L 95  96  95   CO2 22 - 32 mmol/L 29  28  26    Calcium 8.9 - 10.3 mg/dL 8.7  9.4  8.7        Latest Ref Rng  & Units 12/18/2022    8:11 AM 12/17/2022    7:11 AM 12/16/2022    4:23 PM  CBC  WBC 4.0 - 10.5 K/uL 6.1  8.4  15.4   Hemoglobin 12.0 - 15.0 g/dL 9.9  91.4  78.2   Hematocrit 36.0 - 46.0 % 31.2  31.2  32.5   Platelets 150 - 400 K/uL 131  127  126      Author: Loyce Dys, MD 12/18/2022 4:05 PM  For on call review [email or web address].

## 2022-12-18 NOTE — Plan of Care (Signed)
[facility] Tonsillectomy/Adenoidectomy, Postoperative PEDS care plan entered in error.

## 2022-12-19 ENCOUNTER — Encounter: Payer: Self-pay | Admitting: Internal Medicine

## 2022-12-19 DIAGNOSIS — S32000A Wedge compression fracture of unspecified lumbar vertebra, initial encounter for closed fracture: Secondary | ICD-10-CM | POA: Diagnosis not present

## 2022-12-19 LAB — CBC
HCT: 28.3 % — ABNORMAL LOW (ref 36.0–46.0)
Hemoglobin: 9 g/dL — ABNORMAL LOW (ref 12.0–15.0)
MCH: 33.5 pg (ref 26.0–34.0)
MCHC: 31.8 g/dL (ref 30.0–36.0)
MCV: 105.2 fL — ABNORMAL HIGH (ref 80.0–100.0)
Platelets: 123 10*3/uL — ABNORMAL LOW (ref 150–400)
RBC: 2.69 MIL/uL — ABNORMAL LOW (ref 3.87–5.11)
RDW: 15.7 % — ABNORMAL HIGH (ref 11.5–15.5)
WBC: 4.2 10*3/uL (ref 4.0–10.5)
nRBC: 0 % (ref 0.0–0.2)

## 2022-12-19 LAB — BASIC METABOLIC PANEL
Anion gap: 16 — ABNORMAL HIGH (ref 5–15)
BUN: 20 mg/dL (ref 8–23)
CO2: 26 mmol/L (ref 22–32)
Calcium: 9.7 mg/dL (ref 8.9–10.3)
Chloride: 92 mmol/L — ABNORMAL LOW (ref 98–111)
Creatinine, Ser: 0.81 mg/dL (ref 0.44–1.00)
GFR, Estimated: >60 mL/min (ref >60)
Glucose, Bld: 87 mg/dL (ref 70–99)
Potassium: 3.7 mmol/L (ref 3.5–5.1)
Sodium: 134 mmol/L — ABNORMAL LOW (ref 135–145)

## 2022-12-19 NOTE — Plan of Care (Signed)

## 2022-12-19 NOTE — Progress Notes (Signed)
Progress Note   Patient: Sheena [name] JJO:[id number] DOB: 09/15/1945 DOA: 12/09/2022     2 DOS: the patient was seen and examined on 12/19/2022     Subjective: Patient seen  No new complaints. Awaiting disposition.  Brief Narrative:  Patient is a 77 year old female with past medical history significant for small cell lung cancer status post chemoradiation, A-fib, HTN, HLD, osteoporosis with prior fragility fracture.  Patient was admitted with acute lower back pain.  CT angio was negative besides advanced emphysema.  CT of the lumbar spine suggested acute/subacute compression fracture of L1. PT/OT recommended SNF.  Started on Remeron due to concerns of depression and lack of appetite.  Underwent IR guided L1 balloon kyphoplasty on 12/17/2022.  Patient is awaiting disposition.     Assessment & Plan:  Principal Problem:   Lumbar vertebral fracture (HCC)   Sepsis likely secondary to possible UTI-improved Urinalysis showed some pyuria, sample was taken after initiation of antibiotics Patient spiked temperature to 102.2 in the setting of leukocytosis Continue antibiotics to complete 5 days course Follow-up on culture results Chest x-ray reviewed by me did not show any infiltrates 12/19/2022: Sepsis physiology has resolved.  No urine culture visualized.  Blood cultures have not grown any organisms.     Acute metabolic encephalopathy r secondary to possible UTI-improved Continue above management  Acute-subacute L1 vertebral compression fracture Acute fracture associated with 2 mm retropulsion without any neurologic deficits.  For now pain control, bowel regimen.  PT/OT-SNF Status post IR guided kyphoplasty on 12/17/2022 [facility] manager working on placement   Acute hypoxic respiratory failure, improved History of emphysema Small cell lung cancer status post chemoradiation CTA is negative for PE but shows advanced emphysema.  Patient also has underlying lung malignancy.   Continue  bronchodilator therapy   Grieving - Secondary to recent loss of husband.  This is significantly hindering her care.  Seen by psychiatry.   Continue on Remeron at bedtime, will adjust as needed.   Permanent atrial fibrillation Continue Lopressor and amiodarone.  Not on long-term anticoagulation   Hypertension Chronic continue on metoprolol per above.  IV as needed   HLD  continue atorvastatin  Possible volume depletion: -Liberal oral fluids. -Continue to monitor closely.   PT/OT = SNF   DVT prophylaxis: Lovenox Code Status: DNR Family Communication: Patient's son updated  Disposition: Pending skilled nursing facility placement     Examination:  General exam: Not in any distress.  Awake and alert.   Respiratory system: Clear to auscultation.  Cardiovascular system: S1 & S2 heard Gastrointestinal system: Abdomen is soft and nontender.   Central nervous system: Awake and alert.   Vitals:   12/18/22 0759 12/18/22 1500 12/18/22 1515 12/19/22 1114  BP: (!) 120/50 (!) 93/47 100/61 95/81  Pulse: 61 69  (!) 113  Resp: 18     Temp: 97.9 F (36.6 C)  97.7 F (36.5 C)   TempSrc: Oral Oral Oral   SpO2: 100% 95%    Weight:          Latest Ref Rng & Units 12/19/2022    4:10 AM 12/18/2022    8:11 AM 12/17/2022    7:11 AM  BMP  Glucose 70 - 99 mg/dL 87  97  160   BUN 8 - 23 mg/dL 20  22  27    Creatinine 0.44 - 1.00 mg/dL 1.09  3.23  5.57   Sodium 135 - 145 mmol/L 134  132  137   Potassium 3.5 - 5.1 mmol/L 3.7  4.4  4.3   Chloride 98 - 111 mmol/L 92  95  96   CO2 22 - 32 mmol/L 26  29  28    Calcium 8.9 - 10.3 mg/dL 9.7  8.7  9.4        Latest Ref Rng & Units 12/19/2022    4:10 AM 12/18/2022    8:11 AM 12/17/2022    7:11 AM  CBC  WBC 4.0 - 10.5 K/uL 4.2  6.1  8.4   Hemoglobin 12.0 - 15.0 g/dL 9.0  9.9  25.9   Hematocrit 36.0 - 46.0 % 28.3  31.2  31.2   Platelets 150 - 400 K/uL 123  131  127      Time spent: 35 minutes.  Author: Barnetta Chapel,  MD 12/19/2022 4:24 PM  For on call review [email or web address].

## 2022-12-20 ENCOUNTER — Inpatient Hospital Stay (HOSPITAL_COMMUNITY): Payer: 59

## 2022-12-20 DIAGNOSIS — S32000A Wedge compression fracture of unspecified lumbar vertebra, initial encounter for closed fracture: Secondary | ICD-10-CM | POA: Diagnosis not present

## 2022-12-20 MED ORDER — HYDROCODONE-ACETAMINOPHEN 5-325 MG PO TABS
1.0000 | ORAL_TABLET | Freq: Four times a day (QID) | ORAL | Status: DC | PRN
Start: 2022-12-20 — End: 2022-12-20

## 2022-12-20 MED ORDER — LIDOCAINE 5 % EX PTCH
1.0000 | MEDICATED_PATCH | CUTANEOUS | Status: DC
  Administered 2022-12-20: 1 via TRANSDERMAL
  Filled 2022-12-20: qty 1

## 2022-12-20 MED ORDER — OXYCODONE HCL 5 MG PO TABS
5.0000 mg | ORAL_TABLET | Freq: Four times a day (QID) | ORAL | Status: DC | PRN
  Administered 2022-12-21 (×2): 5 mg via ORAL
  Filled 2022-12-20 (×2): qty 1

## 2022-12-20 NOTE — Progress Notes (Signed)
Progress Note   Patient: Sheena [name] [id number]:[id number] DOB: 02-03-1946 DOA: 12/09/2022     3 DOS: the patient was seen and examined on 12/20/2022     Subjective: Patient seen  No new complaints. Awaiting disposition.  Brief Narrative:  Patient is a 77 year old female with past medical history significant for small cell lung cancer status post chemoradiation, A-fib, HTN, HLD, osteoporosis with prior fragility fracture.  Patient was admitted with acute lower back pain.  CT angio was negative besides advanced emphysema.  CT of the lumbar spine suggested acute/subacute compression fracture of L1. PT/OT recommended SNF.  Started on Remeron due to concerns of depression and lack of appetite.  Underwent IR guided L1 balloon kyphoplasty on 12/17/2022.  Patient is awaiting disposition.  12/20/2022: Patient seen.  Patient continues to report back pain.  Will discontinue tramadol.  Will start patient on lidocaine patch.  Will also start oxycodone 5 Mg p.o. every 6 hourly as needed.  Repeat chest x-ray today.     Assessment & Plan:  Principal Problem:   Lumbar vertebral fracture (HCC)   Sepsis likely secondary to possible UTI-improved Urinalysis showed some pyuria, sample was taken after initiation of antibiotics Patient spiked temperature to 102.2 in the setting of leukocytosis Continue antibiotics to complete 5 days course Follow-up on culture results Chest x-ray reviewed by me did not show any infiltrates 12/19/2022: Sepsis physiology has resolved.  No urine culture visualized.  Blood cultures have not grown any organisms. 12/20/2022: Negative blood cultures.  Urine culture visualized.  Chest x-ray today.     Acute metabolic encephalopathy r secondary to possible UTI-improved Continue above management  Acute-subacute L1 vertebral compression fracture Acute fracture associated with 2 mm retropulsion without any neurologic deficits.  For now pain control, bowel regimen.  PT/OT-SNF Status  post IR guided kyphoplasty on 12/17/2022 [facility] manager working on placement   Acute hypoxic respiratory failure, improved History of emphysema Small cell lung cancer status post chemoradiation CTA is negative for PE but shows advanced emphysema.  Patient also has underlying lung malignancy.   Continue bronchodilator therapy   Grieving - Secondary to recent loss of husband.  This is significantly hindering her care.  Seen by psychiatry.   Continue on Remeron at bedtime, will adjust as needed.   Permanent atrial fibrillation Continue Lopressor and amiodarone.  Not on long-term anticoagulation   Hypertension Chronic continue on metoprolol per above.  IV as needed   HLD  continue atorvastatin  Possible volume depletion: -Liberal oral fluids. -Continue to monitor closely.   PT/OT = SNF   DVT prophylaxis: Lovenox Code Status: DNR Family Communication: Patient's son updated  Disposition: Pending skilled nursing facility placement     Examination:  General exam: Not in any distress.  Awake and alert.   Respiratory system: Clear to auscultation.  Cardiovascular system: S1 & S2 heard Gastrointestinal system: Abdomen is soft and nontender.   Central nervous system: Awake and alert.   Vitals:   12/19/22 2032 12/20/22 0652 12/20/22 0815 12/20/22 1259  BP: (!) 162/59 (!) 123/58  128/66  Pulse: 86 (!) 59 73 62  Resp: 18 18 17 18   Temp: 98.4 F (36.9 C) 97.9 F (36.6 C) 98.2 F (36.8 C) (!) 97.5 F (36.4 C)  TempSrc: Oral Oral Oral Oral  SpO2: 100% 100% 100% 100%  Weight:          Latest Ref Rng & Units 12/19/2022    4:10 AM 12/18/2022    8:11 AM 12/17/2022    7:11  AM  BMP  Glucose 70 - 99 mg/dL 87  97  324   BUN 8 - 23 mg/dL 20  22  27    Creatinine 0.44 - 1.00 mg/dL 4.01  0.27  2.53   Sodium 135 - 145 mmol/L 134  132  137   Potassium 3.5 - 5.1 mmol/L 3.7  4.4  4.3   Chloride 98 - 111 mmol/L 92  95  96   CO2 22 - 32 mmol/L 26  29  28    Calcium 8.9 - 10.3 mg/dL 9.7   8.7  9.4        Latest Ref Rng & Units 12/19/2022    4:10 AM 12/18/2022    8:11 AM 12/17/2022    7:11 AM  CBC  WBC 4.0 - 10.5 K/uL 4.2  6.1  8.4   Hemoglobin 12.0 - 15.0 g/dL 9.0  9.9  66.4   Hematocrit 36.0 - 46.0 % 28.3  31.2  31.2   Platelets 150 - 400 K/uL 123  131  127      Time spent: 35 minutes.  Author: Barnetta Chapel, MD 12/20/2022 4:34 PM  For on call review [email or web address].

## 2022-12-20 NOTE — Plan of Care (Signed)

## 2022-12-21 DIAGNOSIS — Z87891 Personal history of nicotine dependence: Secondary | ICD-10-CM | POA: Diagnosis not present

## 2022-12-21 DIAGNOSIS — Z743 Need for continuous supervision: Secondary | ICD-10-CM | POA: Diagnosis not present

## 2022-12-21 DIAGNOSIS — D649 Anemia, unspecified: Secondary | ICD-10-CM | POA: Diagnosis not present

## 2022-12-21 DIAGNOSIS — M8088XD Other osteoporosis with current pathological fracture, vertebra(e), subsequent encounter for fracture with routine healing: Secondary | ICD-10-CM | POA: Diagnosis not present

## 2022-12-21 DIAGNOSIS — I10 Essential (primary) hypertension: Secondary | ICD-10-CM | POA: Diagnosis not present

## 2022-12-21 DIAGNOSIS — R278 Other lack of coordination: Secondary | ICD-10-CM | POA: Diagnosis not present

## 2022-12-21 DIAGNOSIS — J439 Emphysema, unspecified: Secondary | ICD-10-CM | POA: Diagnosis not present

## 2022-12-21 DIAGNOSIS — J9601 Acute respiratory failure with hypoxia: Secondary | ICD-10-CM | POA: Diagnosis not present

## 2022-12-21 DIAGNOSIS — N390 Urinary tract infection, site not specified: Secondary | ICD-10-CM | POA: Diagnosis not present

## 2022-12-21 DIAGNOSIS — S32009D Unspecified fracture of unspecified lumbar vertebra, subsequent encounter for fracture with routine healing: Secondary | ICD-10-CM | POA: Diagnosis not present

## 2022-12-21 DIAGNOSIS — C3432 Malignant neoplasm of lower lobe, left bronchus or lung: Secondary | ICD-10-CM | POA: Diagnosis not present

## 2022-12-21 DIAGNOSIS — J449 Chronic obstructive pulmonary disease, unspecified: Secondary | ICD-10-CM | POA: Diagnosis not present

## 2022-12-21 DIAGNOSIS — R2689 Other abnormalities of gait and mobility: Secondary | ICD-10-CM | POA: Diagnosis not present

## 2022-12-21 DIAGNOSIS — S32019A Unspecified fracture of first lumbar vertebra, initial encounter for closed fracture: Secondary | ICD-10-CM | POA: Diagnosis not present

## 2022-12-21 DIAGNOSIS — R1311 Dysphagia, oral phase: Secondary | ICD-10-CM | POA: Diagnosis not present

## 2022-12-21 DIAGNOSIS — M6281 Muscle weakness (generalized): Secondary | ICD-10-CM | POA: Diagnosis not present

## 2022-12-21 DIAGNOSIS — I350 Nonrheumatic aortic (valve) stenosis: Secondary | ICD-10-CM | POA: Diagnosis not present

## 2022-12-21 DIAGNOSIS — C3490 Malignant neoplasm of unspecified part of unspecified bronchus or lung: Secondary | ICD-10-CM | POA: Diagnosis not present

## 2022-12-21 DIAGNOSIS — R0902 Hypoxemia: Secondary | ICD-10-CM | POA: Diagnosis not present

## 2022-12-21 DIAGNOSIS — I480 Paroxysmal atrial fibrillation: Secondary | ICD-10-CM | POA: Diagnosis not present

## 2022-12-21 DIAGNOSIS — S32000A Wedge compression fracture of unspecified lumbar vertebra, initial encounter for closed fracture: Secondary | ICD-10-CM | POA: Diagnosis not present

## 2022-12-21 DIAGNOSIS — I4891 Unspecified atrial fibrillation: Secondary | ICD-10-CM | POA: Diagnosis not present

## 2022-12-21 DIAGNOSIS — J9690 Respiratory failure, unspecified, unspecified whether with hypoxia or hypercapnia: Secondary | ICD-10-CM | POA: Diagnosis not present

## 2022-12-21 DIAGNOSIS — R531 Weakness: Secondary | ICD-10-CM | POA: Diagnosis not present

## 2022-12-21 DIAGNOSIS — E785 Hyperlipidemia, unspecified: Secondary | ICD-10-CM | POA: Diagnosis not present

## 2022-12-21 LAB — CULTURE, BLOOD (ROUTINE X 2)
Culture: NO GROWTH
Culture: NO GROWTH
Special Requests: ADEQUATE

## 2022-12-21 MED ORDER — OXYCODONE HCL 5 MG PO TABS: 5.0000 mg | ORAL_TABLET | Freq: Four times a day (QID) | ORAL | 0 refills | Status: DC | PRN

## 2022-12-21 MED ORDER — SODIUM CHLORIDE 0.9 % IV SOLN
1.0000 g | Freq: Once | INTRAVENOUS | Status: AC
  Administered 2022-12-21: 1 g via INTRAVENOUS
  Filled 2022-12-21: qty 10

## 2022-12-21 MED ORDER — POLYETHYLENE GLYCOL 3350 17 G PO PACK: 17.0000 g | PACK | Freq: Every day | ORAL | 0 refills | Status: DC | PRN

## 2022-12-21 MED ORDER — MIRTAZAPINE 7.5 MG PO TABS: 7.5000 mg | ORAL_TABLET | Freq: Every day | ORAL | Status: DC

## 2022-12-21 NOTE — Progress Notes (Signed)
Pt transported with PTAR with belongings. Report called to [facility].

## 2022-12-21 NOTE — Progress Notes (Signed)
Physical Therapy Treatment Patient Details Name: Sheena [name] MRN: [id number] DOB: [birth year] Today's Date: 12/21/2022   History of Present Illness 77 yo female presents to [facility] for back pain after hearing a "pop". Pt sustained acute L1 compression fx. Additional workup for acute hypoxic respiratory failure. s/p 10/17 L1 kyphoplasty PMH includes small cell lung cancer status post chemoradiation, afib, hypertension, hyperlipidemia, osteoporosis with prior fragility fracture.    PT Comments  Pt reports on entry that she is being transferred to [facility] this evening and does not want to get up with therapy now. Pt expresses apprehension about going to SNF. Encouraged pt to work hard to be able to go home. Discussed pt spending long periods of time flat on her back and that she should be rolling to offweight her back and sacrum. Pt agreeable to practice rolling with back precautions. Pt is able to roll at a contact guard level. Educated pt to roll herself if she feels like her bottom is "going to sleep or pins and needles." Pt expresses understanding. Pt grateful for education.     If plan is discharge home, recommend the following: A lot of help with walking and/or transfers;A lot of help with bathing/dressing/bathroom   Can travel by private vehicle     No  Equipment Recommendations  Rolling walker (2 wheels);BSC/3in1;Other (comment) (Youth-sized RW)       Precautions / Restrictions Precautions Precautions: Fall;Back Precaution Booklet Issued: Yes (comment) Precaution Comments: Reviewed back precautions Restrictions Weight Bearing Restrictions: No     Mobility  Bed Mobility Overal bed mobility: Needs Assistance Bed Mobility: Rolling Rolling: Used rails, Contact guard assist         General bed mobility comments: pt agreeable to rolling training so that she can independently roll in bed at SNF to reduce risk of pressure injury, with use of rails and vc for sequencing pt is able  to roll R and L    Transfers                   General transfer comment: declined due to [facility] coming to transfer pt to [facility] this evening          Cognition Arousal: Alert Behavior During Therapy: WFL for tasks assessed/performed, Anxious Overall Cognitive Status: Within Functional Limits for tasks assessed                                          Exercises  Ankle pumps x 20     General Comments General comments (skin integrity, edema, etc.): slight dizziness with rolling which improves with gaze stabilization      Pertinent Vitals/Pain Pain Assessment Pain Assessment: Faces Faces Pain Scale: Hurts little more Pain Location: low back Pain Descriptors / Indicators: Sore, Discomfort Pain Intervention(s): Limited activity within patient's tolerance, Monitored during session, Premedicated before session, Repositioned     PT Goals (current goals can now be found in the care plan section) Acute Rehab PT Goals PT Goal Formulation: With patient Time For Goal Achievement: 12/24/22 Potential to Achieve Goals: Good Progress towards PT goals: Not progressing toward goals - comment    Frequency    Min 1X/week       AM-PAC PT "6 Clicks" Mobility   Outcome Measure  Help needed turning from your back to your side while in a flat bed without using bedrails?: A Little Help needed moving  from lying on your back to sitting on the side of a flat bed without using bedrails?: A Lot Help needed moving to and from a bed to a chair (including a wheelchair)?: A Lot Help needed standing up from a chair using your arms (e.g., wheelchair or bedside chair)?: A Lot Help needed to walk in hospital room?: Total Help needed climbing 3-5 steps with a railing? : Total 6 Click Score: 11    End of Session   Activity Tolerance: Patient tolerated treatment well Patient left: with call bell/phone within reach;in bed;with bed alarm set Nurse Communication:  Mobility status PT Visit Diagnosis: Other abnormalities of gait and mobility (R26.89);Muscle weakness (generalized) (M62.81)     Time: 5573-2202 PT Time Calculation (min) (ACUTE ONLY): 20 min  Charges:    $Therapeutic Activity: 8-22 mins PT General Charges $$ ACUTE PT VISIT: 1 Visit                     Athene Schuhmacher B. Beverely Risen PT, DPT Acute Rehabilitation Services Please use secure chat or  Call Office [phone]    Elon Alas [facility] 12/21/2022, 5:15 PM

## 2022-12-21 NOTE — Discharge Summary (Signed)
Sheena [name] ZDG:[id number] DOB: [birth year] DOA: 12/09/2022  PCP: [name], MD  Admit date: 12/09/2022 Discharge date: 12/21/2022  Time spent: 35 minutes  Recommendations for Outpatient Follow-up:  Pcp f/u  Oncology f/u    Discharge Diagnoses:  Principal Problem:   Lumbar vertebral fracture [facility]) Active Problems:   Acute respiratory failure with hypoxia (HCC)   History of lung cancer   Discharge Condition: stable  Diet recommendation: heart healthy  Filed Weights   12/10/22 0300  Weight: 44.5 kg    History of present illness:  From admission h and p Sheena [name] is a 77 y.o. female with hx of small cell lung cancer status post chemoradiation, atrial fibrillation, hypertension, hyperlipidemia, osteoporosis with prior fragility fracture, who presents due to worsening lower back pain.  Reports yesterday she was reaching over the table to pick up something and she felt a pop in her mid lower back.  Followed by immediate pain.  Due to the pain she has been unable to get around like she normally could.  Other than this has had no respiratory symptoms, no fevers no sick contacts, no chest pain.  Was on oxygen during a previous hospitalization but is not on chronic home oxygen.    Hospital Course:  Sepsis likely secondary to possible UTI-improved Urinalysis showed some pyuria, sample was taken after initiation of antibiotics Patient spiked temperature to 102.2 in the setting of leukocytosis Treated with a 5-day course of ceftriaxone, currently asymptomatic D/c plan reviewed with daughter   Acute metabolic encephalopathy secondary to possible UTI  Resolved  Acute-subacute L1 vertebral compression fracture Acute fracture associated with 2 mm retropulsion without any neurologic deficits.  For now pain control, bowel regimen.  PT/OT-SNF Status post IR guided kyphoplasty on 12/17/2022 D/c to snf Continue pain control, bowel regimen   Acute hypoxic respiratory failure,  improved History of emphysema Small cell lung cancer status post chemoradiation CTA is negative for PE but shows advanced emphysema.  Patient also has underlying lung malignancy.   Weaned to 2 liters, discharged on that   Grieving - Secondary to recent loss of husband.  This is significantly hindering her care.  Seen by psychiatry.   We started Remeron at bedtime    Permanent atrial fibrillation Continue Lopressor and amiodarone.  Not on long-term anticoagulation  Procedures: IR kyphoplasty   Consultations: IR  Discharge Exam: Vitals:   12/20/22 1259 12/20/22 2033  BP: 128/66 (!) 135/55  Pulse: 62 73  Resp: 18 20  Temp: (!) 97.5 F (36.4 C) 97.8 F (36.6 C)  SpO2: 100% 100%    General: NAD Cardiovascular: RRR Respiratory: CTAB save for rales at bases Extremities: warm, no edema  Discharge Instructions   Discharge Instructions     Diet - low sodium heart healthy   Complete by: As directed    Increase activity slowly   Complete by: As directed    No wound care   Complete by: As directed       Allergies as of 12/21/2022       Reactions   Antihistamines, Diphenhydramine-type Other (See Comments)   palpitations, feel jittery   Aspirin Other (See Comments)   Stomach cramps   Evista [raloxifene] Other (See Comments)   Hot Flashes         Medication List     STOP taking these medications    traMADol 50 MG tablet Commonly known as: ULTRAM       TAKE these medications    acetaminophen 500 MG  tablet Commonly known as: TYLENOL Take 1,000 mg by mouth 2 (two) times daily as needed for mild pain.   amiodarone 200 MG tablet Commonly known as: PACERONE Take 1 tablet (200 mg total) by mouth daily.   atorvastatin 20 MG tablet Commonly known as: LIPITOR TAKE 1 TABLET BY MOUTH EVERY DAY   CALCIUM CITRATE PETITE/VIT D PO Take 1 tablet by mouth 2 (two) times daily.   famotidine 20 MG tablet Commonly known as: PEPCID Take 20 mg by mouth daily as  needed for heartburn.   lidocaine-prilocaine cream Commonly known as: EMLA Apply 1 Application topically as needed. What changed: reasons to take this   Magnesium Citrate 125 MG Caps Take 2 capsules by mouth 2 (two) times daily. What changed: how much to take   metoprolol tartrate 25 MG tablet Commonly known as: LOPRESSOR Take 1 tablet (25 mg total) by mouth 2 (two) times daily.   mirtazapine 7.5 MG tablet Commonly known as: REMERON Take 1 tablet (7.5 mg total) by mouth at bedtime.   oxyCODONE 5 MG immediate release tablet Commonly known as: Oxy IR/ROXICODONE Take 1 tablet (5 mg total) by mouth every 6 (six) hours as needed for severe pain (pain score 7-10).   polyethylene glycol 17 g packet Commonly known as: MIRALAX / GLYCOLAX Take 17 g by mouth daily as needed for mild constipation.   PRESCRIPTION MEDICATION Apply 1 application  topically in the morning and at bedtime. Unknown cream given to Pt from the cancer center for after radiation   prochlorperazine 10 MG tablet Commonly known as: COMPAZINE Take 1 tablet (10 mg total) by mouth every 6 (six) hours as needed for nausea or vomiting.   sucralfate 1 GM/10ML suspension Commonly known as: Carafate Take 10 mLs (1 g total) by mouth 4 (four) times daily -  with meals and at bedtime.       Allergies  Allergen Reactions   Antihistamines, Diphenhydramine-Type Other (See Comments)    palpitations, feel jittery   Aspirin Other (See Comments)    Stomach cramps   Evista [Raloxifene] Other (See Comments)    Hot Flashes     Follow-up Information     [name], MD Follow up.   Specialty: Family Medicine Contact information: [address] [place] [address] [phone]                  The results of significant diagnostics from this hospitalization (including imaging, microbiology, ancillary and laboratory) are listed below for reference.    Significant Diagnostic Studies: DG CHEST PORT 1  VIEW  Result Date: 12/20/2022 CLINICAL DATA:  PNA; hx small cell lung cancer post chemoradiation EXAM: PORTABLE CHEST 1 VIEW COMPARISON:  Chest x-ray 12/16/2022. CT angio chest 12/09/2022 FINDINGS: Right chest wall Port-A-Cath with tip overlying the expected region of the distal superior vena cava. The heart and mediastinal contours are unchanged. Aortic calcification. Hyperinflation of the lungs. No focal consolidation. No pulmonary edema. No pleural effusion. No pneumothorax. No acute osseous abnormality.  Kyphoplasty noted. IMPRESSION: 1. No active disease. 2. Aortic Atherosclerosis (ICD10-I70.0) and Emphysema (ICD10-J43.9). Electronically Signed   By: Tish Frederickson M.D.   On: 12/20/2022 20:58   IR KYPHO LUMBAR INC FX REDUCE BONE BX UNI/BIL CANNULATION INC/IMAGING  Result Date: 12/20/2022 INDICATION: Severe low back pain secondary to compression fracture related to osteoporosis at L1. EXAM: BALLOON KYPHOPLASTY AT L1 COMPARISON:  Recent CT of the lumbosacral spine. MEDICATIONS: As antibiotic prophylaxis, none was ordered pre-procedure and administered  intravenously within 1 hour of incision. All current medications are in the EMR and have been reviewed as part of this encounter. ANESTHESIA/SEDATION: Moderate (conscious) sedation was employed during this procedure. A total of Versed 2 mg and Fentanyl 100 mcg, 0.5 mg of Dilaudid IV was administered intravenously by the radiology nurse. Total intra-service moderate Sedation Time: 32 minutes. The patient's level of consciousness and vital signs were monitored continuously by radiology nursing throughout the procedure under my direct supervision. FLUOROSCOPY: Radiation Exposure Index (as provided by the fluoroscopic device): 13 minutes. 42 seconds 441 mGy Kerma COMPLICATIONS: None immediate. PROCEDURE: Following a full explanation of the procedure along with the potential associated complications, an informed witnessed consent was obtained. The patient was  placed prone on the fluoroscopic table. The skin overlying the lumbar region was then prepped and draped in the usual sterile fashion. The right pedicle at L1 was then infiltrated with 0.25% bupivacaine followed by the advancement of an 11-gauge Jamshidi needle through the right pedicle into the posterior one-third at L1. This was then exchanged for a Kyphon advanced osteo introducer system comprised of a working cannula and a Kyphon osteo drill. This combination was then advanced over a Kyphon osteo bone pin until the tip of the Kyphon osteo drill was in the posterior third at L1. At this time, the bone pin was removed. In a medial trajectory, the combination was advanced until the tip of the working cannula was inside the posterior one-third at L1. The osteo drill was removed. Through the working cannula, a Kyphon inflatable bone tamp 20 x 3 was advanced and positioned with the distal marker 5 mm from the anterior aspect of L1. Crossing of the midline was seen on the AP projection. At this time, the balloon was expanded using contrast via a Kyphon inflation syringe device via microtubing. Inflations were continued until there was apposition with the superior and the inferior endplates. At this time, methylmethacrylate mixture was reconstituted with Tobramycin in the Kyphon bone mixing device system. This was then loaded onto the Kyphon bone fillers. The balloon was deflated and removed followed by the instillation of 4 and half bone filler equivalents of methylmethacrylate mixture at L1 with excellent filling in the AP and lateral projections. No extravasation was noted in the disk spaces or posteriorly into the spinal canal. No epidural venous contamination was seen. The working cannula and the bone filler were then retrieved and removed. Hemostasis was achieved at the skin entry site. Patient tolerated the procedure well. She was then returned to the floor in stable condition. 3D rotational CT of the lumbar  spine was then performed with 3D reformations performed on a separate workstation. Noted was mild extension of the methylmethacrylate mixture into the previously seen 2 mm retropulsion. IMPRESSION: 1. Status post vertebral body augmentation using balloon kyphoplasty at L1 as described without event. If the patient has known osteoporosis, recommend treatment as clinically indicated. If the patient's bone density status is unknown, DEXA scan is recommended. Electronically Signed   By: Julieanne Cotton M.D.   On: 12/20/2022 11:48   DG CHEST PORT 1 VIEW  Result Date: 12/16/2022 CLINICAL DATA:  Severe back pain.  Fever EXAM: PORTABLE CHEST 1 VIEW COMPARISON:  12/09/2022 FINDINGS: Patient is markedly rotated towards the right and would not allow repositioning. Allowing for this, power port on the right appears unchanged. I do not identify any consolidation, collapse or effusion. Consider nonportable radiography if concern persists. Old augmented lower thoracic fracture. Few other partial compression  fractures above and below that of unknown age. IMPRESSION: 1. No active disease suspected. Limited study due to patient positioning. Consider nonportable radiography if concern persists. 2. Old augmented lower thoracic fracture. Few other partial compression fractures above and below that of unknown age. Electronically Signed   By: Paulina Fusi M.D.   On: 12/16/2022 15:08   CT Angio Chest PE W and/or Wo Contrast  Result Date: 12/10/2022 CLINICAL DATA:  Low back pain, diarrhea, hypoxia, shortness of breath. PE suspected. Diarrhea. Increased fracture risk. EXAM: CT ANGIOGRAPHY CHEST CT ABDOMEN AND PELVIS WITH CONTRAST TECHNIQUE: Multidetector CT imaging of the chest was performed using the standard protocol during bolus administration of intravenous contrast. Multiplanar CT image reconstructions and MIPs were obtained to evaluate the vascular anatomy. Multidetector CT imaging of the abdomen and pelvis was performed  using the standard protocol during bolus administration of intravenous contrast. RADIATION DOSE REDUCTION: This exam was performed according to the departmental dose-optimization program which includes automated exposure control, adjustment of the mA and/or kV according to patient size and/or use of iterative reconstruction technique. CONTRAST:  75mL OMNIPAQUE IOHEXOL 350 MG/ML SOLN COMPARISON:  Chest radiograph 12/09/2022; CT chest 10/19/2022; PET/CT 06/12/2022; MRI lumbar spine 01/04/2013 FINDINGS: CTA CHEST FINDINGS Cardiovascular: Negative for acute pulmonary embolism. No pericardial effusion. Coronary artery and aortic atherosclerotic calcification. Mediastinum/Nodes: Trachea and esophagus are unremarkable. No thoracic adenopathy. Lungs/Pleura: Advanced emphysema. Bibasilar atelectasis/scarring. No focal consolidation, pleural effusion, or pneumothorax. Musculoskeletal: No acute fracture. Chronic compression fractures of T5, T8, T11, and T12. T12 vertebroplasty. Review of the MIP images confirms the above findings. CT ABDOMEN and PELVIS FINDINGS Hepatobiliary: No acute abnormality. Pancreas: Unremarkable. Spleen: Unremarkable. Adrenals/Urinary Tract: Normal adrenal glands. No urinary calculi or hydronephrosis. Unremarkable bladder. Stomach/Bowel: Normal caliber large and small bowel. No bowel wall thickening. Small hiatal hernia. Descending duodenum lipoma. Vascular/Lymphatic: Aortic atherosclerosis. No enlarged abdominal or pelvic lymph nodes. Reproductive: Uterus and bilateral adnexa are unremarkable. Other: No free intraperitoneal fluid or air. Musculoskeletal: No acute fracture. Review of the MIP images confirms the above findings. CT LUMBAR SPINE Segmentation: 5 lumbar type vertebrae. Alignment: No evidence of traumatic malalignment. Vertebrae: New acute angulation of the anterior cortex of L1 with sclerosis through the mid vertebral body. Findings are compatible with acute or subacute fracture. Chronic  compression fracture of L2 and L5. 2 mm of retropulsion of the mid L1 vertebral body. Paraspinal and other soft tissues: See above. Disc levels: Age-related degenerative spondylosis. No severe spinal canal or neural foraminal narrowing. IMPRESSION: 1. Negative for acute pulmonary embolism. 2. Advanced emphysema. 3. Acute-subacute compression fracture of L1 with 2 mm of retropulsion. 4. No acute abnormality in the abdomen or pelvis. Electronically Signed   By: Minerva Fester M.D.   On: 12/10/2022 00:55   CT ABDOMEN PELVIS W CONTRAST  Result Date: 12/10/2022 CLINICAL DATA:  Low back pain, diarrhea, hypoxia, shortness of breath. PE suspected. Diarrhea. Increased fracture risk. EXAM: CT ANGIOGRAPHY CHEST CT ABDOMEN AND PELVIS WITH CONTRAST TECHNIQUE: Multidetector CT imaging of the chest was performed using the standard protocol during bolus administration of intravenous contrast. Multiplanar CT image reconstructions and MIPs were obtained to evaluate the vascular anatomy. Multidetector CT imaging of the abdomen and pelvis was performed using the standard protocol during bolus administration of intravenous contrast. RADIATION DOSE REDUCTION: This exam was performed according to the departmental dose-optimization program which includes automated exposure control, adjustment of the mA and/or kV according to patient size and/or use of iterative reconstruction technique. CONTRAST:  75mL OMNIPAQUE  IOHEXOL 350 MG/ML SOLN COMPARISON:  Chest radiograph 12/09/2022; CT chest 10/19/2022; PET/CT 06/12/2022; MRI lumbar spine 01/04/2013 FINDINGS: CTA CHEST FINDINGS Cardiovascular: Negative for acute pulmonary embolism. No pericardial effusion. Coronary artery and aortic atherosclerotic calcification. Mediastinum/Nodes: Trachea and esophagus are unremarkable. No thoracic adenopathy. Lungs/Pleura: Advanced emphysema. Bibasilar atelectasis/scarring. No focal consolidation, pleural effusion, or pneumothorax. Musculoskeletal: No  acute fracture. Chronic compression fractures of T5, T8, T11, and T12. T12 vertebroplasty. Review of the MIP images confirms the above findings. CT ABDOMEN and PELVIS FINDINGS Hepatobiliary: No acute abnormality. Pancreas: Unremarkable. Spleen: Unremarkable. Adrenals/Urinary Tract: Normal adrenal glands. No urinary calculi or hydronephrosis. Unremarkable bladder. Stomach/Bowel: Normal caliber large and small bowel. No bowel wall thickening. Small hiatal hernia. Descending duodenum lipoma. Vascular/Lymphatic: Aortic atherosclerosis. No enlarged abdominal or pelvic lymph nodes. Reproductive: Uterus and bilateral adnexa are unremarkable. Other: No free intraperitoneal fluid or air. Musculoskeletal: No acute fracture. Review of the MIP images confirms the above findings. CT LUMBAR SPINE Segmentation: 5 lumbar type vertebrae. Alignment: No evidence of traumatic malalignment. Vertebrae: New acute angulation of the anterior cortex of L1 with sclerosis through the mid vertebral body. Findings are compatible with acute or subacute fracture. Chronic compression fracture of L2 and L5. 2 mm of retropulsion of the mid L1 vertebral body. Paraspinal and other soft tissues: See above. Disc levels: Age-related degenerative spondylosis. No severe spinal canal or neural foraminal narrowing. IMPRESSION: 1. Negative for acute pulmonary embolism. 2. Advanced emphysema. 3. Acute-subacute compression fracture of L1 with 2 mm of retropulsion. 4. No acute abnormality in the abdomen or pelvis. Electronically Signed   By: Minerva Fester M.D.   On: 12/10/2022 00:55   CT L-SPINE NO CHARGE  Result Date: 12/10/2022 CLINICAL DATA:  Low back pain, diarrhea, hypoxia, shortness of breath. PE suspected. Diarrhea. Increased fracture risk. EXAM: CT ANGIOGRAPHY CHEST CT ABDOMEN AND PELVIS WITH CONTRAST TECHNIQUE: Multidetector CT imaging of the chest was performed using the standard protocol during bolus administration of intravenous contrast.  Multiplanar CT image reconstructions and MIPs were obtained to evaluate the vascular anatomy. Multidetector CT imaging of the abdomen and pelvis was performed using the standard protocol during bolus administration of intravenous contrast. RADIATION DOSE REDUCTION: This exam was performed according to the departmental dose-optimization program which includes automated exposure control, adjustment of the mA and/or kV according to patient size and/or use of iterative reconstruction technique. CONTRAST:  75mL OMNIPAQUE IOHEXOL 350 MG/ML SOLN COMPARISON:  Chest radiograph 12/09/2022; CT chest 10/19/2022; PET/CT 06/12/2022; MRI lumbar spine 01/04/2013 FINDINGS: CTA CHEST FINDINGS Cardiovascular: Negative for acute pulmonary embolism. No pericardial effusion. Coronary artery and aortic atherosclerotic calcification. Mediastinum/Nodes: Trachea and esophagus are unremarkable. No thoracic adenopathy. Lungs/Pleura: Advanced emphysema. Bibasilar atelectasis/scarring. No focal consolidation, pleural effusion, or pneumothorax. Musculoskeletal: No acute fracture. Chronic compression fractures of T5, T8, T11, and T12. T12 vertebroplasty. Review of the MIP images confirms the above findings. CT ABDOMEN and PELVIS FINDINGS Hepatobiliary: No acute abnormality. Pancreas: Unremarkable. Spleen: Unremarkable. Adrenals/Urinary Tract: Normal adrenal glands. No urinary calculi or hydronephrosis. Unremarkable bladder. Stomach/Bowel: Normal caliber large and small bowel. No bowel wall thickening. Small hiatal hernia. Descending duodenum lipoma. Vascular/Lymphatic: Aortic atherosclerosis. No enlarged abdominal or pelvic lymph nodes. Reproductive: Uterus and bilateral adnexa are unremarkable. Other: No free intraperitoneal fluid or air. Musculoskeletal: No acute fracture. Review of the MIP images confirms the above findings. CT LUMBAR SPINE Segmentation: 5 lumbar type vertebrae. Alignment: No evidence of traumatic malalignment. Vertebrae: New  acute angulation of the anterior cortex of L1 with sclerosis through the  mid vertebral body. Findings are compatible with acute or subacute fracture. Chronic compression fracture of L2 and L5. 2 mm of retropulsion of the mid L1 vertebral body. Paraspinal and other soft tissues: See above. Disc levels: Age-related degenerative spondylosis. No severe spinal canal or neural foraminal narrowing. IMPRESSION: 1. Negative for acute pulmonary embolism. 2. Advanced emphysema. 3. Acute-subacute compression fracture of L1 with 2 mm of retropulsion. 4. No acute abnormality in the abdomen or pelvis. Electronically Signed   By: Minerva Fester M.D.   On: 12/10/2022 00:55   DG Chest Portable 1 View  Result Date: 12/09/2022 CLINICAL DATA:  Hypoxic.  Shortness of breath. EXAM: PORTABLE CHEST 1 VIEW COMPARISON:  Radiographs 09/07/2022 and CT chest 10/19/2022 FINDINGS: Rotated patient. Right chest wall Port-A-Cath tip in the mid SVC. Stable cardiomediastinal silhouette. Aortic atherosclerotic calcification. Hyperinflation and chronic bronchitic change. No focal consolidation, pleural effusion, or pneumothorax. Lower thoracic vertebroplasty. IMPRESSION: No acute cardiopulmonary process.  Emphysema. Electronically Signed   By: Minerva Fester M.D.   On: 12/09/2022 23:11    Microbiology: Recent Results (from the past 240 hour(s))  MRSA Next Gen by PCR, Nasal     Status: None   Collection Time: 12/15/22  8:25 AM   Specimen: Nasal Mucosa; Nasal Swab  Result Value Ref Range Status   MRSA by PCR Next Gen NOT DETECTED NOT DETECTED Final    Comment: (NOTE) The GeneXpert MRSA Assay (FDA approved for NASAL specimens only), is one component of a comprehensive MRSA colonization surveillance program. It is not intended to diagnose MRSA infection nor to guide or monitor treatment for MRSA infections. Test performance is not FDA approved in patients less than 48 years old. Performed at [facility], [address],  [place], [place] [address]   Culture, blood (Routine X 2) w Reflex to ID Panel     Status: None   Collection Time: 12/16/22  4:28 PM   Specimen: BLOOD  Result Value Ref Range Status   Specimen Description BLOOD SITE NOT SPECIFIED  Final   Special Requests   Final    BOTTLES DRAWN AEROBIC AND ANAEROBIC Blood Culture adequate volume   Culture   Final    NO GROWTH 5 DAYS Performed at [facility], [address], [place], [place] [address]    Report Status 12/21/2022 FINAL  Final  Culture, blood (Routine X 2) w Reflex to ID Panel     Status: None   Collection Time: 12/16/22  4:28 PM   Specimen: BLOOD  Result Value Ref Range Status   Specimen Description BLOOD SITE NOT SPECIFIED  Final   Special Requests   Final    BOTTLES DRAWN AEROBIC ONLY Blood Culture results may not be optimal due to an inadequate volume of blood received in culture bottles   Culture   Final    NO GROWTH 5 DAYS Performed at [facility], [address] [place], [place] [address]    Report Status 12/21/2022 FINAL  Final     Labs: Basic Metabolic Panel: Recent Labs  Lab 12/15/22 0622 12/16/22 1623 12/17/22 0711 12/18/22 0811 12/19/22 0410  NA 137 131* 137 132* 134*  K 4.1 4.4 4.3 4.4 3.7  CL 98 95* 96* 95* 92*  CO2 25 26 28 29 26   GLUCOSE 84 169* 100* 97 87  BUN 17 30* 27* 22 20  CREATININE 1.05* 1.19* 1.01* 0.93 0.81  CALCIUM 9.4 8.7* 9.4 8.7* 9.7   Liver Function Tests: No  results for input(s): "AST", "ALT", "ALKPHOS", "BILITOT", "PROT", "ALBUMIN" in the last 168 hours. No results for input(s): "LIPASE", "AMYLASE" in the last 168 hours. No results for input(s): "AMMONIA" in the last 168 hours. CBC: Recent Labs  Lab 12/15/22 0622 12/16/22 1623 12/17/22 0711 12/18/22 0811 12/19/22 0410  WBC 5.9 15.4* 8.4 6.1 4.2  HGB 11.5* 10.6* 10.1* 9.9* 9.0*  HCT 36.8 32.5* 31.2* 31.2* 28.3*  MCV 106.1* 103.5* 106.1* 108.0* 105.2*  PLT 133* 126* 127* 131* 123*   Cardiac Enzymes: No results  for input(s): "CKTOTAL", "CKMB", "CKMBINDEX", "TROPONINI" in the last 168 hours. BNP: BNP (last 3 results) Recent Labs    12/09/22 2152  BNP 254.3*    ProBNP (last 3 results) No results for input(s): "PROBNP" in the last 8760 hours.  CBG: No results for input(s): "GLUCAP" in the last 168 hours.     Signed:  Silvano Bilis MD.  [facility] 12/21/2022, 1:28 PM

## 2022-12-21 NOTE — TOC Progression Note (Addendum)
Transition of Care [facility]) - Progression Note    Patient Details  Name: Sheena [name] MRN: [id number] Date of Birth: February 18, 1946  Transition of Care [facility]) CM/SW Contact  Lorri Frederick, LCSW Phone Number: 12/21/2022, 8:34 AM  Clinical Narrative:   SNF auth request still pending in [place].  1250: Auth approved: [id number], 3 days: 10/21-10/23.  CSW confirmed with [facility] that they can receive pt today.  MD notified.   Expected Discharge Plan: Skilled Nursing Facility Barriers to Discharge: Continued Medical Work up, SNF Pending bed offer  Expected Discharge Plan and Services In-house Referral: Clinical Social Work   Post Acute Care Choice: Skilled Nursing Facility Living arrangements for the past 2 months: Single Family Home                                       Social Determinants of Health (SDOH) Interventions SDOH Screenings   Food Insecurity: No Food Insecurity (12/10/2022)  Housing: Patient Declined (12/10/2022)  Transportation Needs: Unmet Transportation Needs (12/10/2022)  Utilities: Not At Risk (12/10/2022)  Depression (PHQ2-9): Low Risk  (01/09/2022)  Financial Resource Strain: Low Risk  (01/09/2022)  Physical Activity: Inactive (01/09/2022)  Social Connections: Moderately Isolated (01/09/2022)  Stress: No Stress Concern Present (01/09/2022)  Tobacco Use: Medium Risk (12/16/2022)    Readmission Risk Interventions    10/09/2022    2:31 PM  Readmission Risk Prevention Plan  Transportation Screening Complete  PCP or Specialist Appt within 3-5 Days Complete  HRI or Home Care Consult Complete  Social Work Consult for Recovery Care Planning/Counseling Complete  Palliative Care Screening Not Applicable  Medication Review Oceanographer) Complete

## 2022-12-21 NOTE — TOC Transition Note (Signed)
Transition of Care [facility]) - CM/SW Discharge Note   Patient Details  Name: Sheena [name] MRN: [id number] Date of Birth: 1946/01/11  Transition of Care [facility]) CM/SW Contact:  Lorri Frederick, LCSW Phone Number: 12/21/2022, 3:15 PM   Clinical Narrative:   Pt discharging to [facility]  RN call report to [phone].    Final next level of care: Skilled Nursing Facility Barriers to Discharge: Barriers Resolved   Patient Goals and CMS Choice CMS Medicare.gov Compare Post Acute Care list provided to:: Patient Choice offered to / list presented to : Patient  Discharge Placement                Patient [facility] bed at: [facility] Patient to be transferred to facility by: [facility] Name of family member notified: daughter [name] Patient and family notified of of transfer: 12/21/22  Discharge Plan and Services Additional resources added to the After Visit Summary for   In-house Referral: Clinical Social Work   Post Acute Care Choice: Skilled Nursing Facility                               Social Determinants of Health (SDOH) Interventions SDOH Screenings   Food Insecurity: No Food Insecurity (12/10/2022)  Housing: Patient Declined (12/10/2022)  Transportation Needs: Unmet Transportation Needs (12/10/2022)  Utilities: Not At Risk (12/10/2022)  Depression (PHQ2-9): Low Risk  (01/09/2022)  Financial Resource Strain: Low Risk  (01/09/2022)  Physical Activity: Inactive (01/09/2022)  Social Connections: Moderately Isolated (01/09/2022)  Stress: No Stress Concern Present (01/09/2022)  Tobacco Use: Medium Risk (12/16/2022)     Readmission Risk Interventions    10/09/2022    2:31 PM  Readmission Risk Prevention Plan  Transportation Screening Complete  PCP or Specialist Appt within 3-5 Days Complete  HRI or Home Care Consult Complete  Social Work Consult for Recovery Care Planning/Counseling Complete  Palliative Care Screening Not Applicable  Medication Review  Oceanographer) Complete

## 2022-12-22 DIAGNOSIS — R531 Weakness: Secondary | ICD-10-CM | POA: Diagnosis not present

## 2022-12-22 DIAGNOSIS — J439 Emphysema, unspecified: Secondary | ICD-10-CM | POA: Diagnosis not present

## 2022-12-22 DIAGNOSIS — R278 Other lack of coordination: Secondary | ICD-10-CM | POA: Diagnosis not present

## 2022-12-22 DIAGNOSIS — M6281 Muscle weakness (generalized): Secondary | ICD-10-CM | POA: Diagnosis not present

## 2022-12-22 DIAGNOSIS — I4891 Unspecified atrial fibrillation: Secondary | ICD-10-CM | POA: Diagnosis not present

## 2022-12-22 DIAGNOSIS — S32000A Wedge compression fracture of unspecified lumbar vertebra, initial encounter for closed fracture: Secondary | ICD-10-CM | POA: Diagnosis not present

## 2022-12-22 DIAGNOSIS — J449 Chronic obstructive pulmonary disease, unspecified: Secondary | ICD-10-CM | POA: Diagnosis not present

## 2022-12-22 DIAGNOSIS — N390 Urinary tract infection, site not specified: Secondary | ICD-10-CM | POA: Diagnosis not present

## 2022-12-22 DIAGNOSIS — C3490 Malignant neoplasm of unspecified part of unspecified bronchus or lung: Secondary | ICD-10-CM | POA: Diagnosis not present

## 2022-12-22 DIAGNOSIS — S32009D Unspecified fracture of unspecified lumbar vertebra, subsequent encounter for fracture with routine healing: Secondary | ICD-10-CM | POA: Diagnosis not present

## 2022-12-22 NOTE — Consult Note (Signed)
Value-Based Care Institute  [facility] Inpatient Consult   12/22/2022  Violetta Dimon [facility] 1945-05-24 [id number]  [facility] [[facility]]  Accountable Care Organization [ACO] Patient: [employer] Dual Complete  Primary Care Provider:  [name], MD, [place] is listed for the community transition of care   Patient was reviewed for 11 day length of stay with high risk score for unplanned readmission score and barriers to care for returning to community.  Patient was screened for hospitalization and on behalf of [facility] Care Coordination to assess for post hospital community care needs.  Patient transitioned to a skilled nursing facility level of care for post hospital transition.  Patient transitioned to a [facility] affiliated facility at [facility]  with traditional Medicare and approved Medicare Advantage plans.    Plan:   Will notify the Community [facility] RN can follow for any known or needs for transitional care needs for returning to post facility care coordination needs to return to community.  For questions or referrals, please contact:   Charlesetta Shanks, RN, BSN, CCM [place]  [facility], [facility] Liaison Direct Dial: [phone] or secure chat Website: [email or web address]

## 2022-12-23 ENCOUNTER — Encounter: Payer: Self-pay | Admitting: Internal Medicine

## 2022-12-23 DIAGNOSIS — D649 Anemia, unspecified: Secondary | ICD-10-CM | POA: Diagnosis not present

## 2022-12-24 ENCOUNTER — Other Ambulatory Visit: Payer: Self-pay

## 2022-12-24 ENCOUNTER — Encounter (HOSPITAL_COMMUNITY): Payer: Self-pay

## 2022-12-24 DIAGNOSIS — R531 Weakness: Secondary | ICD-10-CM | POA: Diagnosis not present

## 2022-12-24 DIAGNOSIS — C3490 Malignant neoplasm of unspecified part of unspecified bronchus or lung: Secondary | ICD-10-CM | POA: Diagnosis not present

## 2022-12-24 DIAGNOSIS — J449 Chronic obstructive pulmonary disease, unspecified: Secondary | ICD-10-CM | POA: Diagnosis not present

## 2022-12-24 DIAGNOSIS — S32000A Wedge compression fracture of unspecified lumbar vertebra, initial encounter for closed fracture: Secondary | ICD-10-CM | POA: Diagnosis not present

## 2022-12-24 DIAGNOSIS — N390 Urinary tract infection, site not specified: Secondary | ICD-10-CM | POA: Diagnosis not present

## 2022-12-24 DIAGNOSIS — I4891 Unspecified atrial fibrillation: Secondary | ICD-10-CM | POA: Diagnosis not present

## 2022-12-25 ENCOUNTER — Ambulatory Visit: Payer: 59 | Attending: Internal Medicine | Admitting: Internal Medicine

## 2022-12-25 ENCOUNTER — Encounter: Payer: Self-pay | Admitting: Internal Medicine

## 2022-12-25 ENCOUNTER — Telehealth: Payer: Self-pay | Admitting: Internal Medicine

## 2022-12-25 VITALS — BP 108/50 | HR 64 | Ht 60.0 in | Wt 90.6 lb

## 2022-12-25 DIAGNOSIS — I480 Paroxysmal atrial fibrillation: Secondary | ICD-10-CM

## 2022-12-25 DIAGNOSIS — J449 Chronic obstructive pulmonary disease, unspecified: Secondary | ICD-10-CM | POA: Diagnosis not present

## 2022-12-25 DIAGNOSIS — C3490 Malignant neoplasm of unspecified part of unspecified bronchus or lung: Secondary | ICD-10-CM | POA: Diagnosis not present

## 2022-12-25 DIAGNOSIS — M6281 Muscle weakness (generalized): Secondary | ICD-10-CM | POA: Diagnosis not present

## 2022-12-25 DIAGNOSIS — R278 Other lack of coordination: Secondary | ICD-10-CM | POA: Diagnosis not present

## 2022-12-25 DIAGNOSIS — E785 Hyperlipidemia, unspecified: Secondary | ICD-10-CM

## 2022-12-25 DIAGNOSIS — J439 Emphysema, unspecified: Secondary | ICD-10-CM | POA: Diagnosis not present

## 2022-12-25 DIAGNOSIS — R531 Weakness: Secondary | ICD-10-CM | POA: Diagnosis not present

## 2022-12-25 DIAGNOSIS — N390 Urinary tract infection, site not specified: Secondary | ICD-10-CM | POA: Diagnosis not present

## 2022-12-25 DIAGNOSIS — I350 Nonrheumatic aortic (valve) stenosis: Secondary | ICD-10-CM | POA: Diagnosis not present

## 2022-12-25 DIAGNOSIS — I4891 Unspecified atrial fibrillation: Secondary | ICD-10-CM | POA: Diagnosis not present

## 2022-12-25 DIAGNOSIS — I10 Essential (primary) hypertension: Secondary | ICD-10-CM | POA: Diagnosis not present

## 2022-12-25 DIAGNOSIS — S32000A Wedge compression fracture of unspecified lumbar vertebra, initial encounter for closed fracture: Secondary | ICD-10-CM | POA: Diagnosis not present

## 2022-12-25 DIAGNOSIS — S32009D Unspecified fracture of unspecified lumbar vertebra, subsequent encounter for fracture with routine healing: Secondary | ICD-10-CM | POA: Diagnosis not present

## 2022-12-25 NOTE — Telephone Encounter (Signed)
Rescheduled 11/27 to 12/04 due to provider pal, patient has been called and voicemail was left.

## 2022-12-25 NOTE — Patient Instructions (Signed)
Medication Instructions:  No change  *If you need a refill on your cardiac medications before your next appointment, please call your pharmacy*    Testing/Procedures: None    Follow-Up: At [facility], you and your health needs are our priority.  As part of our continuing mission to provide you with exceptional heart care, we have created designated Provider Care Teams.  These Care Teams include your primary Cardiologist (physician) and Advanced Practice Providers (APPs -  Physician Assistants and Nurse Practitioners) who all work together to provide you with the care you need, when you need it.  We recommend signing up for the patient portal called "MyChart".  Sign up information is provided on this After Visit Summary.  MyChart is used to connect with patients for Virtual Visits (Telemedicine).  Patients are able to view lab/test results, encounter notes, upcoming appointments, etc.  Non-urgent messages can be sent to your provider as well.   To learn more about what you can do with MyChart, go to [email or web address].    Your next appointment:   6 month(s)  Provider:   Micah Flesher, PA-C  or  Chrystie Nose, MD

## 2022-12-27 NOTE — Progress Notes (Signed)
OFFICE NOTE  Chief Complaint:  Follow-up  Primary Care Physician: [name], MD  HPI:  Sheena [name] is a 77 y.o. female with a past medial history significant for small cell lung cancer, pancytopenia with chemotherapy and XRT, and history of paroxysmal atrial fibrillation, not anticoagulated due to history of GI bleed.  She has a history of mild AI and MR on echo.  In July 2024 she was admitted for new onset A-fib.  She was started on amiodarone and converted to sinus rhythm.  She has not been anticoagulated due to thrombocytopenia and anemia requiring transfusions.  She now returns today for follow-up. She was just hospitalized for back pain  She had an echo in the hospital in July of this year.  Demonstrated normal LVEF 60 to 65%.  No mitral regurgitation and only trivial aortic insufficiency was noted with mild aortic stenosis.  Mean valve gradient was 6 mmHg.  PMHx:  Past Medical History:  Diagnosis Date   Anxiety    Heart murmur    Hypertension    Primary small cell carcinoma of lower lobe of left lung (HCC)    Scarlet fever with other complications childhood   "had to learn to work again" has had leg weakness    Past Surgical History:  Procedure Laterality Date   BRONCHIAL NEEDLE ASPIRATION BIOPSY  06/30/2022   Procedure: BRONCHIAL NEEDLE ASPIRATION BIOPSIES;  Surgeon: [name], DO;  Location: [facility];  Service: Pulmonary;;   IR IMAGING GUIDED PORT INSERTION  07/31/2022   IR KYPHO LUMBAR INC FX REDUCE BONE BX UNI/BIL CANNULATION INC/IMAGING  12/17/2022   IR US GUIDE VASC ACCESS LEFT  12/17/2022   KYPHOPLASTY     2014   peridontal surgery     VIDEO BRONCHOSCOPY WITH ENDOBRONCHIAL ULTRASOUND N/A 06/30/2022   Procedure: VIDEO BRONCHOSCOPY WITH ENDOBRONCHIAL ULTRASOUND;  Surgeon: [name], DO;  Location: [facility];  Service: Pulmonary;  Laterality: N/A;    FAMHx:  Family History  Problem Relation Age of Onset   Cancer Mother         breast, spine mets   Hypertension Mother    Heart disease Mother 7       CABG 4 vessel   COPD Father        emphysema   Heart disease Brother 27       MI    SOCHx:   reports that she quit smoking about 5 years ago. Her smoking use included cigarettes. She started smoking about 50 years ago. She has a 45 pack-year smoking history. She has never used smokeless tobacco. She reports that she does not drink alcohol and does not use drugs.  ALLERGIES:  Allergies  Allergen Reactions   Antihistamines, Diphenhydramine-Type Other (See Comments)    palpitations, feel jittery   Aspirin Other (See Comments)    Stomach cramps   Evista [Raloxifene] Other (See Comments)    Hot Flashes     ROS: Pertinent items noted in HPI and remainder of comprehensive ROS otherwise negative.  HOME MEDS: Current Outpatient Medications on File Prior to Visit  Medication Sig Dispense Refill   acetaminophen (TYLENOL) 500 MG tablet Take 1,000 mg by mouth 2 (two) times daily as needed for mild pain.     amiodarone (PACERONE) 200 MG tablet Take 1 tablet (200 mg total) by mouth daily. 90 tablet 3   atorvastatin (LIPITOR) 20 MG tablet TAKE 1 TABLET BY MOUTH EVERY DAY (Patient taking differently: Take 20 mg by  mouth daily.) 90 tablet 3   Calcium Citrate-Vitamin D (CALCIUM CITRATE PETITE/VIT D PO) Take 1 tablet by mouth 2 (two) times daily.     Magnesium Citrate 125 MG CAPS Take 2 capsules by mouth 2 (two) times daily. (Patient taking differently: Take 125 mg by mouth 2 (two) times daily.) 90 capsule 3   metoprolol tartrate (LOPRESSOR) 25 MG tablet Take 1 tablet (25 mg total) by mouth 2 (two) times daily. 180 tablet 3   mirtazapine (REMERON) 7.5 MG tablet Take 1 tablet (7.5 mg total) by mouth at bedtime.     oxyCODONE (OXY IR/ROXICODONE) 5 MG immediate release tablet Take 1 tablet (5 mg total) by mouth every 6 (six) hours as needed for severe pain (pain score 7-10). 30 tablet 0   PRESCRIPTION MEDICATION Apply 1  application  topically in the morning and at bedtime. Unknown cream given to Pt from the cancer center for after radiation     prochlorperazine (COMPAZINE) 10 MG tablet Take 1 tablet (10 mg total) by mouth every 6 (six) hours as needed for nausea or vomiting. 30 tablet 2   sucralfate (CARAFATE) 1 GM/10ML suspension Take 10 mLs (1 g total) by mouth 4 (four) times daily -  with meals and at bedtime. 420 mL 1   famotidine (PEPCID) 20 MG tablet Take 20 mg by mouth daily as needed for heartburn. (Patient not taking: Reported on 12/25/2022)     lidocaine-prilocaine (EMLA) cream Apply 1 Application topically as needed. (Patient not taking: Reported on 12/25/2022) 30 g 2   polyethylene glycol (MIRALAX / GLYCOLAX) 17 g packet Take 17 g by mouth daily as needed for mild constipation. (Patient not taking: Reported on 12/25/2022) 14 each 0   No current facility-administered medications on file prior to visit.    LABS/IMAGING: No results found for this or any previous visit (from the past 48 hour(s)). No results found.  LIPID PANEL:    Component Value Date/Time   CHOL 109 10/08/2022 0539   TRIG 123 10/08/2022 0539   HDL 37 (L) 10/08/2022 0539   CHOLHDL 2.9 10/08/2022 0539   VLDL 25 10/08/2022 0539   LDLCALC 47 10/08/2022 0539   LDLDIRECT 189.3 03/05/2014 0953     WEIGHTS: Wt Readings from Last 3 Encounters:  12/25/22 90 lb 9.6 oz (41.1 kg)  12/10/22 98 lb 1.7 oz (44.5 kg)  10/22/22 93 lb 6.4 oz (42.4 kg)    VITALS: BP (!) 108/50 (BP Location: Left Arm, Patient Position: Sitting, Cuff Size: Normal)   Pulse 64   Ht 5' (1.524 m)   Wt 90 lb 9.6 oz (41.1 kg)   SpO2 98%   BMI 17.69 kg/m   EXAM: General appearance: alert and no distress Neck: no carotid bruit, no JVD, and thyroid not enlarged, symmetric, no tenderness/mass/nodules Lungs: clear to auscultation bilaterally Heart: regular rate and rhythm Abdomen: soft, non-tender; bowel sounds normal; no masses,  no organomegaly Extremities:  extremities normal, atraumatic, no cyanosis or edema Pulses: 2+ and symmetric Skin: Skin color, texture, turgor normal. No rashes or lesions Neurologic: Grossly normal Psych: Pleasant  EKG: Deferred  ASSESSMENT: Paroxysmal atrial fibrillation History of pancytopenia/anemia-not anticoagulated Mild AS and trivial AI  PLAN: 1.   Overall, Sheena [name] seems to be doing well.  She was just hospitalized with some respiratory failure.  And had a UTI with sepsis but no hypotension.  This caused some metabolic encephalopathy which has resolved.  Echo over the summer showed an LVEF of 60 to 65%.  She will need repeat imaging next year.  Plan follow-up with me or APP or sooner as necessary.  Chrystie Nose, MD, [facility], FACP  [place]  [facility]  Medical Director of the Advanced Lipid Disorders &  Cardiovascular Risk Reduction Clinic Diplomate of the American Board of Clinical Lipidology Attending Cardiologist  Direct Dial: [phone]  Fax: [phone]  Website:  [email or web address] Nicely Roschelle Calandra 12/27/2022, 10:00 PM

## 2022-12-28 DIAGNOSIS — I4891 Unspecified atrial fibrillation: Secondary | ICD-10-CM | POA: Diagnosis not present

## 2022-12-28 DIAGNOSIS — J449 Chronic obstructive pulmonary disease, unspecified: Secondary | ICD-10-CM | POA: Diagnosis not present

## 2022-12-28 DIAGNOSIS — R531 Weakness: Secondary | ICD-10-CM | POA: Diagnosis not present

## 2022-12-28 DIAGNOSIS — N390 Urinary tract infection, site not specified: Secondary | ICD-10-CM | POA: Diagnosis not present

## 2022-12-28 DIAGNOSIS — C3490 Malignant neoplasm of unspecified part of unspecified bronchus or lung: Secondary | ICD-10-CM | POA: Diagnosis not present

## 2022-12-28 DIAGNOSIS — S32000A Wedge compression fracture of unspecified lumbar vertebra, initial encounter for closed fracture: Secondary | ICD-10-CM | POA: Diagnosis not present

## 2022-12-29 DIAGNOSIS — I4891 Unspecified atrial fibrillation: Secondary | ICD-10-CM | POA: Diagnosis not present

## 2022-12-29 DIAGNOSIS — S32000A Wedge compression fracture of unspecified lumbar vertebra, initial encounter for closed fracture: Secondary | ICD-10-CM | POA: Diagnosis not present

## 2022-12-29 DIAGNOSIS — R531 Weakness: Secondary | ICD-10-CM | POA: Diagnosis not present

## 2022-12-29 DIAGNOSIS — C3490 Malignant neoplasm of unspecified part of unspecified bronchus or lung: Secondary | ICD-10-CM | POA: Diagnosis not present

## 2022-12-29 DIAGNOSIS — N390 Urinary tract infection, site not specified: Secondary | ICD-10-CM | POA: Diagnosis not present

## 2022-12-29 DIAGNOSIS — J449 Chronic obstructive pulmonary disease, unspecified: Secondary | ICD-10-CM | POA: Diagnosis not present

## 2022-12-30 DIAGNOSIS — S32000A Wedge compression fracture of unspecified lumbar vertebra, initial encounter for closed fracture: Secondary | ICD-10-CM | POA: Diagnosis not present

## 2022-12-30 DIAGNOSIS — R531 Weakness: Secondary | ICD-10-CM | POA: Diagnosis not present

## 2022-12-30 DIAGNOSIS — I4891 Unspecified atrial fibrillation: Secondary | ICD-10-CM | POA: Diagnosis not present

## 2022-12-30 DIAGNOSIS — C3490 Malignant neoplasm of unspecified part of unspecified bronchus or lung: Secondary | ICD-10-CM | POA: Diagnosis not present

## 2022-12-30 DIAGNOSIS — N390 Urinary tract infection, site not specified: Secondary | ICD-10-CM | POA: Diagnosis not present

## 2022-12-30 DIAGNOSIS — J449 Chronic obstructive pulmonary disease, unspecified: Secondary | ICD-10-CM | POA: Diagnosis not present

## 2023-01-01 DIAGNOSIS — S32000A Wedge compression fracture of unspecified lumbar vertebra, initial encounter for closed fracture: Secondary | ICD-10-CM | POA: Diagnosis not present

## 2023-01-01 DIAGNOSIS — I4891 Unspecified atrial fibrillation: Secondary | ICD-10-CM | POA: Diagnosis not present

## 2023-01-01 DIAGNOSIS — R531 Weakness: Secondary | ICD-10-CM | POA: Diagnosis not present

## 2023-01-01 DIAGNOSIS — N390 Urinary tract infection, site not specified: Secondary | ICD-10-CM | POA: Diagnosis not present

## 2023-01-01 DIAGNOSIS — C3490 Malignant neoplasm of unspecified part of unspecified bronchus or lung: Secondary | ICD-10-CM | POA: Diagnosis not present

## 2023-01-01 DIAGNOSIS — J449 Chronic obstructive pulmonary disease, unspecified: Secondary | ICD-10-CM | POA: Diagnosis not present

## 2023-01-05 DIAGNOSIS — M6281 Muscle weakness (generalized): Secondary | ICD-10-CM | POA: Diagnosis not present

## 2023-01-05 DIAGNOSIS — J439 Emphysema, unspecified: Secondary | ICD-10-CM | POA: Diagnosis not present

## 2023-01-05 DIAGNOSIS — I4891 Unspecified atrial fibrillation: Secondary | ICD-10-CM | POA: Diagnosis not present

## 2023-01-05 DIAGNOSIS — S32009D Unspecified fracture of unspecified lumbar vertebra, subsequent encounter for fracture with routine healing: Secondary | ICD-10-CM | POA: Diagnosis not present

## 2023-01-05 DIAGNOSIS — R278 Other lack of coordination: Secondary | ICD-10-CM | POA: Diagnosis not present

## 2023-01-08 ENCOUNTER — Other Ambulatory Visit: Payer: Self-pay | Admitting: *Deleted

## 2023-01-08 DIAGNOSIS — J449 Chronic obstructive pulmonary disease, unspecified: Secondary | ICD-10-CM | POA: Diagnosis not present

## 2023-01-08 DIAGNOSIS — S32009D Unspecified fracture of unspecified lumbar vertebra, subsequent encounter for fracture with routine healing: Secondary | ICD-10-CM | POA: Diagnosis not present

## 2023-01-08 DIAGNOSIS — M6281 Muscle weakness (generalized): Secondary | ICD-10-CM | POA: Diagnosis not present

## 2023-01-08 DIAGNOSIS — R278 Other lack of coordination: Secondary | ICD-10-CM | POA: Diagnosis not present

## 2023-01-08 DIAGNOSIS — N390 Urinary tract infection, site not specified: Secondary | ICD-10-CM | POA: Diagnosis not present

## 2023-01-08 DIAGNOSIS — S32000A Wedge compression fracture of unspecified lumbar vertebra, initial encounter for closed fracture: Secondary | ICD-10-CM | POA: Diagnosis not present

## 2023-01-08 DIAGNOSIS — I4891 Unspecified atrial fibrillation: Secondary | ICD-10-CM | POA: Diagnosis not present

## 2023-01-08 DIAGNOSIS — C3490 Malignant neoplasm of unspecified part of unspecified bronchus or lung: Secondary | ICD-10-CM | POA: Diagnosis not present

## 2023-01-08 DIAGNOSIS — J439 Emphysema, unspecified: Secondary | ICD-10-CM | POA: Diagnosis not present

## 2023-01-08 DIAGNOSIS — R531 Weakness: Secondary | ICD-10-CM | POA: Diagnosis not present

## 2023-01-08 NOTE — Patient Outreach (Signed)
[name] resides in in [place] per [facility]. Screening for potential chronic care management services as a benefit of health plan and primary care provider.  Voicemail left for [facility] social worker. Secure email message also left to inquire about transition plans/needs.   Will continue to follow.   Raiford Noble, MSN, RN, BSN [place]  [facility], Healthy Communities RN Post- Acute Care Coordinator Direct Dial: [phone]

## 2023-01-11 ENCOUNTER — Encounter: Payer: Self-pay | Admitting: General Practice

## 2023-01-11 NOTE — Progress Notes (Signed)
[facility] Spiritual Care Note  Referred by Chaplain Oneita Hurt, who met Ms [facility] while providing care at [facility]. Left voicemail of introduction for Ms [facility], encouraging return call.   [address] Rush Barer, [place], [facility] Pager [phone] Voicemail [phone]

## 2023-01-12 ENCOUNTER — Other Ambulatory Visit: Payer: Self-pay | Admitting: *Deleted

## 2023-01-12 DIAGNOSIS — S32009D Unspecified fracture of unspecified lumbar vertebra, subsequent encounter for fracture with routine healing: Secondary | ICD-10-CM | POA: Diagnosis not present

## 2023-01-12 DIAGNOSIS — J439 Emphysema, unspecified: Secondary | ICD-10-CM | POA: Diagnosis not present

## 2023-01-12 DIAGNOSIS — I4891 Unspecified atrial fibrillation: Secondary | ICD-10-CM | POA: Diagnosis not present

## 2023-01-12 DIAGNOSIS — R278 Other lack of coordination: Secondary | ICD-10-CM | POA: Diagnosis not present

## 2023-01-12 DIAGNOSIS — M6281 Muscle weakness (generalized): Secondary | ICD-10-CM | POA: Diagnosis not present

## 2023-01-12 NOTE — Patient Outreach (Signed)
Post-Acute Care Coordinator follow up. Sheena [name] resides in [facility]. Screening for potential chronic care management services as a benefit of health plan and primary care provider.  Previous update from [place], SNF social worker, indicated projected dc date not set. No further details provided.  Telephone call made to daughter Sheena [name] [phone]. Sheena [name] reports transition plan is to return home alone. States they previously appealed discharge and won. States they have not been given an dc date as of yet. Sheena [name] states she will check in on her mother daily when she returns home.  Writer advised to contact [facility] customer service line to see if Sheena [name] has any benefits for meals and transportation. Sheena [name] states they will look into it.   Writer explained chronic care management follow up. Sheena [name] states she will make her mother aware of CCM calls post SNF.   Will continue to follow and plan to make referral to CCM team post dc.   Raiford Noble, MSN, RN, BSN [place]  [facility], Healthy Communities RN Post- Acute Care Coordinator Direct Dial: [phone]

## 2023-01-13 ENCOUNTER — Encounter: Payer: Self-pay | Admitting: General Practice

## 2023-01-13 NOTE — Progress Notes (Signed)
[facility] Spiritual Care Note  Left voicemail encouraging return call.   [address] Rush Barer, [place], [facility] Pager [phone] Voicemail [phone]

## 2023-01-15 DIAGNOSIS — M6281 Muscle weakness (generalized): Secondary | ICD-10-CM | POA: Diagnosis not present

## 2023-01-15 DIAGNOSIS — J439 Emphysema, unspecified: Secondary | ICD-10-CM | POA: Diagnosis not present

## 2023-01-15 DIAGNOSIS — R278 Other lack of coordination: Secondary | ICD-10-CM | POA: Diagnosis not present

## 2023-01-15 DIAGNOSIS — S32009D Unspecified fracture of unspecified lumbar vertebra, subsequent encounter for fracture with routine healing: Secondary | ICD-10-CM | POA: Diagnosis not present

## 2023-01-15 DIAGNOSIS — I4891 Unspecified atrial fibrillation: Secondary | ICD-10-CM | POA: Diagnosis not present

## 2023-01-19 DIAGNOSIS — J439 Emphysema, unspecified: Secondary | ICD-10-CM | POA: Diagnosis not present

## 2023-01-19 DIAGNOSIS — R278 Other lack of coordination: Secondary | ICD-10-CM | POA: Diagnosis not present

## 2023-01-19 DIAGNOSIS — C3490 Malignant neoplasm of unspecified part of unspecified bronchus or lung: Secondary | ICD-10-CM | POA: Diagnosis not present

## 2023-01-19 DIAGNOSIS — J449 Chronic obstructive pulmonary disease, unspecified: Secondary | ICD-10-CM | POA: Diagnosis not present

## 2023-01-19 DIAGNOSIS — N390 Urinary tract infection, site not specified: Secondary | ICD-10-CM | POA: Diagnosis not present

## 2023-01-19 DIAGNOSIS — I4891 Unspecified atrial fibrillation: Secondary | ICD-10-CM | POA: Diagnosis not present

## 2023-01-19 DIAGNOSIS — R531 Weakness: Secondary | ICD-10-CM | POA: Diagnosis not present

## 2023-01-19 DIAGNOSIS — M6281 Muscle weakness (generalized): Secondary | ICD-10-CM | POA: Diagnosis not present

## 2023-01-19 DIAGNOSIS — S32009D Unspecified fracture of unspecified lumbar vertebra, subsequent encounter for fracture with routine healing: Secondary | ICD-10-CM | POA: Diagnosis not present

## 2023-01-19 DIAGNOSIS — S32000A Wedge compression fracture of unspecified lumbar vertebra, initial encounter for closed fracture: Secondary | ICD-10-CM | POA: Diagnosis not present

## 2023-01-20 ENCOUNTER — Other Ambulatory Visit: Payer: Self-pay | Admitting: *Deleted

## 2023-01-20 NOTE — Patient Outreach (Signed)
Post- Acute Care Manager follow up. Mrs. Gervase resides in [place] [facility].  Screening for potential chronic care management services as a benefit of health plan and primary care provider.  Secure communication sent and voicemail left for [facility] social worker to inquire about home health arrangements.   Telephone call to Kathrynn Running (daughter) who reports they won the post recent appeal. However, Mrs. Devery will transition home on this Friday. States Mrs. Seales will return home alone. [name] states she will check on Mrs. Guinta on the way to work and on her way back home. States Ms. Catalano has a very supportive neighbor. States transportation and meals will not be an issue. States Ms. Bodnar will be able to prepare her own meals as well. Mrs. Kercheval will have home health but she is not sure of the home health agency name.   [name] reports they do not have any concerns with Mrs. Soltau returning home. [name] states Mrs. Houseworth is doing a lot better than she was before they appealed. Confirms best contact number for Mrs. Hansson when she returns home is the home number at [phone]. [name] states Mrs. Rosinski's [facility] room number is [phone].  Tabitha advises Clinical research associate to call Mrs. Yutzy after 12 today, after therapy and lunch.   Will plan outreach to Mrs. Beichner on [employer] to discuss CCM follow up.   Raiford Noble, MSN, RN, BSN [place]  [facility], Healthy Communities RN Post- Acute Care Manager Direct Dial: [phone]

## 2023-01-20 NOTE — Patient Outreach (Signed)
Post-Acute Care Manager follow up call. Previously spoke with Mrs. Cafarella's daughter [name].   Spoke with Mrs. Maule via [employer] SNF telephone line. Patient identifiers confirmed. Discussed chronic care management follow up. Sheena [name] is agreeable. Confirms best contact number [phone]. States she has appointment at [facility] next Wednesday so she will not be available to talk at that time. Otherwise, Mrs. Nason agreeable to CCM calls.  Will plan to make referral for RN CM upon SNF discharge. Will await to hear back from SNF social worker regarding home health arrangements.   Raiford Noble, MSN, RN, BSN [place]  [facility], Healthy Communities RN Post- Acute Care Manager Direct Dial: [phone]

## 2023-01-21 DIAGNOSIS — S32000A Wedge compression fracture of unspecified lumbar vertebra, initial encounter for closed fracture: Secondary | ICD-10-CM | POA: Diagnosis not present

## 2023-01-21 DIAGNOSIS — J449 Chronic obstructive pulmonary disease, unspecified: Secondary | ICD-10-CM | POA: Diagnosis not present

## 2023-01-21 DIAGNOSIS — C3490 Malignant neoplasm of unspecified part of unspecified bronchus or lung: Secondary | ICD-10-CM | POA: Diagnosis not present

## 2023-01-21 DIAGNOSIS — R531 Weakness: Secondary | ICD-10-CM | POA: Diagnosis not present

## 2023-01-21 DIAGNOSIS — N390 Urinary tract infection, site not specified: Secondary | ICD-10-CM | POA: Diagnosis not present

## 2023-01-21 DIAGNOSIS — I4891 Unspecified atrial fibrillation: Secondary | ICD-10-CM | POA: Diagnosis not present

## 2023-01-22 ENCOUNTER — Other Ambulatory Visit: Payer: Self-pay

## 2023-01-22 ENCOUNTER — Inpatient Hospital Stay: Payer: 59 | Attending: Internal Medicine

## 2023-01-22 DIAGNOSIS — C3490 Malignant neoplasm of unspecified part of unspecified bronchus or lung: Secondary | ICD-10-CM | POA: Diagnosis not present

## 2023-01-22 DIAGNOSIS — C3432 Malignant neoplasm of lower lobe, left bronchus or lung: Secondary | ICD-10-CM | POA: Diagnosis not present

## 2023-01-22 DIAGNOSIS — J449 Chronic obstructive pulmonary disease, unspecified: Secondary | ICD-10-CM | POA: Diagnosis not present

## 2023-01-22 DIAGNOSIS — Z95828 Presence of other vascular implants and grafts: Secondary | ICD-10-CM

## 2023-01-22 DIAGNOSIS — Z87891 Personal history of nicotine dependence: Secondary | ICD-10-CM | POA: Insufficient documentation

## 2023-01-22 LAB — CBC WITH DIFFERENTIAL (CANCER CENTER ONLY)
Abs Immature Granulocytes: 0.02 10*3/uL (ref 0.00–0.07)
Basophils Absolute: 0.1 10*3/uL (ref 0.0–0.1)
Basophils Relative: 1 %
Eosinophils Absolute: 0.1 10*3/uL (ref 0.0–0.5)
Eosinophils Relative: 2 %
HCT: 32.2 % — ABNORMAL LOW (ref 36.0–46.0)
Hemoglobin: 10.3 g/dL — ABNORMAL LOW (ref 12.0–15.0)
Immature Granulocytes: 0 %
Lymphocytes Relative: 21 %
Lymphs Abs: 1.5 10*3/uL (ref 0.7–4.0)
MCH: 34.1 pg — ABNORMAL HIGH (ref 26.0–34.0)
MCHC: 32 g/dL (ref 30.0–36.0)
MCV: 106.6 fL — ABNORMAL HIGH (ref 80.0–100.0)
Monocytes Absolute: 0.9 10*3/uL (ref 0.1–1.0)
Monocytes Relative: 12 %
Neutro Abs: 4.8 10*3/uL (ref 1.7–7.7)
Neutrophils Relative %: 64 %
Platelet Count: 191 10*3/uL (ref 150–400)
RBC: 3.02 MIL/uL — ABNORMAL LOW (ref 3.87–5.11)
RDW: 12.5 % (ref 11.5–15.5)
WBC Count: 7.4 10*3/uL (ref 4.0–10.5)
nRBC: 0 % (ref 0.0–0.2)

## 2023-01-22 LAB — CMP (CANCER CENTER ONLY)
ALT: 16 U/L (ref 0–44)
AST: 25 U/L (ref 15–41)
Albumin: 3.9 g/dL (ref 3.5–5.0)
Alkaline Phosphatase: 71 U/L (ref 38–126)
Anion gap: 5 (ref 5–15)
BUN: 15 mg/dL (ref 8–23)
CO2: 31 mmol/L (ref 22–32)
Calcium: 9.9 mg/dL (ref 8.9–10.3)
Chloride: 100 mmol/L (ref 98–111)
Creatinine: 1.03 mg/dL — ABNORMAL HIGH (ref 0.44–1.00)
GFR, Estimated: 57 mL/min — ABNORMAL LOW (ref >60)
Glucose, Bld: 99 mg/dL (ref 70–99)
Potassium: 4.7 mmol/L (ref 3.5–5.1)
Sodium: 136 mmol/L (ref 135–145)
Total Bilirubin: 0.4 mg/dL (ref <1.2)
Total Protein: 7.2 g/dL (ref 6.5–8.1)

## 2023-01-22 MED ORDER — HEPARIN SOD (PORK) LOCK FLUSH 100 UNIT/ML IV SOLN
250.0000 [IU] | Freq: Once | INTRAVENOUS | Status: AC
  Administered 2023-01-22: 500 [IU]

## 2023-01-22 MED ORDER — SODIUM CHLORIDE 0.9% FLUSH
10.0000 mL | Freq: Once | INTRAVENOUS | Status: AC
  Administered 2023-01-22: 10 mL

## 2023-01-25 DIAGNOSIS — K219 Gastro-esophageal reflux disease without esophagitis: Secondary | ICD-10-CM | POA: Diagnosis not present

## 2023-01-25 DIAGNOSIS — Z9981 Dependence on supplemental oxygen: Secondary | ICD-10-CM | POA: Diagnosis not present

## 2023-01-25 DIAGNOSIS — E785 Hyperlipidemia, unspecified: Secondary | ICD-10-CM | POA: Diagnosis not present

## 2023-01-25 DIAGNOSIS — G8929 Other chronic pain: Secondary | ICD-10-CM | POA: Diagnosis not present

## 2023-01-25 DIAGNOSIS — J9601 Acute respiratory failure with hypoxia: Secondary | ICD-10-CM | POA: Diagnosis not present

## 2023-01-25 DIAGNOSIS — I350 Nonrheumatic aortic (valve) stenosis: Secondary | ICD-10-CM | POA: Diagnosis not present

## 2023-01-25 DIAGNOSIS — Z87440 Personal history of urinary (tract) infections: Secondary | ICD-10-CM | POA: Diagnosis not present

## 2023-01-25 DIAGNOSIS — N390 Urinary tract infection, site not specified: Secondary | ICD-10-CM | POA: Diagnosis not present

## 2023-01-25 DIAGNOSIS — C3432 Malignant neoplasm of lower lobe, left bronchus or lung: Secondary | ICD-10-CM | POA: Diagnosis not present

## 2023-01-25 DIAGNOSIS — J449 Chronic obstructive pulmonary disease, unspecified: Secondary | ICD-10-CM | POA: Diagnosis not present

## 2023-01-25 DIAGNOSIS — M8088XD Other osteoporosis with current pathological fracture, vertebra(e), subsequent encounter for fracture with routine healing: Secondary | ICD-10-CM | POA: Diagnosis not present

## 2023-01-25 DIAGNOSIS — I10 Essential (primary) hypertension: Secondary | ICD-10-CM | POA: Diagnosis not present

## 2023-01-25 DIAGNOSIS — Z87891 Personal history of nicotine dependence: Secondary | ICD-10-CM | POA: Diagnosis not present

## 2023-01-25 DIAGNOSIS — D630 Anemia in neoplastic disease: Secondary | ICD-10-CM | POA: Diagnosis not present

## 2023-01-25 DIAGNOSIS — I4891 Unspecified atrial fibrillation: Secondary | ICD-10-CM | POA: Diagnosis not present

## 2023-01-25 DIAGNOSIS — Z79891 Long term (current) use of opiate analgesic: Secondary | ICD-10-CM | POA: Diagnosis not present

## 2023-01-26 ENCOUNTER — Telehealth: Payer: Self-pay | Admitting: Family Medicine

## 2023-01-26 ENCOUNTER — Telehealth: Payer: Self-pay | Admitting: *Deleted

## 2023-01-26 ENCOUNTER — Other Ambulatory Visit: Payer: Self-pay | Admitting: *Deleted

## 2023-01-26 DIAGNOSIS — I10 Essential (primary) hypertension: Secondary | ICD-10-CM

## 2023-01-26 NOTE — Telephone Encounter (Signed)
Called and spoke with Chrissie Noa and VO given.

## 2023-01-26 NOTE — Telephone Encounter (Signed)
Home Health Verbal Orders  Agency:  [facility]  Caller:  Trisha Mangle and title  Requesting OT/ PT/ Skilled nursing/ Social Work/ Speech:    Reason for Request:  Pt hospitalized for L1 fracture, on new oxygen, skilled nurse 1 week 4, pain management and oxygen teaching  Frequency:    HH needs F2F w/in last 30 days     [phone]

## 2023-01-26 NOTE — Progress Notes (Signed)
  Care Coordination   Note   01/26/2023 Name: Sheena [name] [facility] MRN: [id number] DOB: 31-Jan-1946  Sheena [name] is a 77 y.o. year old female who sees Durene Cal, Aldine Contes, MD for primary care. I reached out to Juleen Starr [facility] by phone today to offer care coordination services.  [name] was given information about Care Coordination services today including:   The Care Coordination services include support from the care team which includes your Nurse Coordinator, Clinical Social Worker, or Pharmacist.  The Care Coordination team is here to help remove barriers to the health concerns and goals most important to you. Care Coordination services are voluntary, and the patient may decline or stop services at any time by request to their care team member.   Care Coordination Consent Status: Patient agreed to services and verbal consent obtained.   Follow up plan:  Telephone appointment with care coordination team member scheduled for:  02/01/2023  Encounter Outcome:  Patient Scheduled from referral   Burman Nieves, [facility] Care Coordination Care Guide Direct Dial: [phone]

## 2023-01-26 NOTE — Patient Outreach (Addendum)
Late entry for 01/25/23. Post- Acute Care Management follow up. Verified in [facility], Sheena [name] discharged from [facility] on 01/22/23. Screening for complex care management services as benefit of PCP and health plan.   Sheena [name] previously agreed to CCM follow up on 01/20/23.   Telephone call made to Sheena [name] social worker. Sheena [name] will have PT/OT/RN thru [facility] home health agency. DME ordered thru [employer] for 3 in 1 bedside commode, shower chair, and oxygen (new). Sheena [name] also reports she submitted application for PCS services thru Medicaid on Sheena [name]'s behalf.   Will refer to complex care management team for RN follow up. Sheena [name] has medical history of lung cancer, AFIB, HTN, HLD.  Raiford Noble, MSN, RN, BSN [place]  [facility], Healthy Communities RN Post- Acute Care Manager Direct Dial: [phone]

## 2023-01-27 ENCOUNTER — Other Ambulatory Visit: Payer: Self-pay

## 2023-01-27 ENCOUNTER — Ambulatory Visit: Payer: 59 | Admitting: Internal Medicine

## 2023-01-30 ENCOUNTER — Ambulatory Visit (HOSPITAL_COMMUNITY)
Admission: RE | Admit: 2023-01-30 | Discharge: 2023-01-30 | Disposition: A | Discharge status: Home / Self Care | Payer: 59 | Source: Ambulatory Visit | Attending: Physician Assistant | Admitting: Physician Assistant

## 2023-01-30 DIAGNOSIS — C3432 Malignant neoplasm of lower lobe, left bronchus or lung: Secondary | ICD-10-CM | POA: Insufficient documentation

## 2023-01-30 DIAGNOSIS — J439 Emphysema, unspecified: Secondary | ICD-10-CM | POA: Diagnosis not present

## 2023-01-30 DIAGNOSIS — I6782 Cerebral ischemia: Secondary | ICD-10-CM | POA: Diagnosis not present

## 2023-01-30 DIAGNOSIS — J90 Pleural effusion, not elsewhere classified: Secondary | ICD-10-CM | POA: Diagnosis not present

## 2023-01-30 DIAGNOSIS — C3490 Malignant neoplasm of unspecified part of unspecified bronchus or lung: Secondary | ICD-10-CM | POA: Diagnosis not present

## 2023-01-30 DIAGNOSIS — I700 Atherosclerosis of aorta: Secondary | ICD-10-CM | POA: Diagnosis not present

## 2023-01-30 MED ORDER — GADOBUTROL 1 MMOL/ML IV SOLN
5.0000 mL | Freq: Once | INTRAVENOUS | Status: AC | PRN
  Administered 2023-01-30: 5 mL via INTRAVENOUS

## 2023-01-30 MED ORDER — HEPARIN SOD (PORK) LOCK FLUSH 100 UNIT/ML IV SOLN: 500.0000 [IU] | Freq: Once | INTRAVENOUS | Status: DC

## 2023-01-30 MED ORDER — HEPARIN SOD (PORK) LOCK FLUSH 100 UNIT/ML IV SOLN
INTRAVENOUS | Status: AC
  Filled 2023-01-30: qty 5

## 2023-01-30 MED ORDER — IOHEXOL 300 MG/ML  SOLN
75.0000 mL | Freq: Once | INTRAMUSCULAR | Status: AC | PRN
  Administered 2023-01-30: 75 mL via INTRAVENOUS

## 2023-02-01 ENCOUNTER — Ambulatory Visit: Payer: Self-pay

## 2023-02-01 DIAGNOSIS — G8929 Other chronic pain: Secondary | ICD-10-CM | POA: Diagnosis not present

## 2023-02-01 DIAGNOSIS — N390 Urinary tract infection, site not specified: Secondary | ICD-10-CM | POA: Diagnosis not present

## 2023-02-01 DIAGNOSIS — I10 Essential (primary) hypertension: Secondary | ICD-10-CM | POA: Diagnosis not present

## 2023-02-01 DIAGNOSIS — D630 Anemia in neoplastic disease: Secondary | ICD-10-CM | POA: Diagnosis not present

## 2023-02-01 DIAGNOSIS — C3432 Malignant neoplasm of lower lobe, left bronchus or lung: Secondary | ICD-10-CM | POA: Diagnosis not present

## 2023-02-01 DIAGNOSIS — Z79891 Long term (current) use of opiate analgesic: Secondary | ICD-10-CM | POA: Diagnosis not present

## 2023-02-01 DIAGNOSIS — Z87891 Personal history of nicotine dependence: Secondary | ICD-10-CM | POA: Diagnosis not present

## 2023-02-01 DIAGNOSIS — J449 Chronic obstructive pulmonary disease, unspecified: Secondary | ICD-10-CM | POA: Diagnosis not present

## 2023-02-01 DIAGNOSIS — K219 Gastro-esophageal reflux disease without esophagitis: Secondary | ICD-10-CM | POA: Diagnosis not present

## 2023-02-01 DIAGNOSIS — Z87440 Personal history of urinary (tract) infections: Secondary | ICD-10-CM | POA: Diagnosis not present

## 2023-02-01 DIAGNOSIS — J9601 Acute respiratory failure with hypoxia: Secondary | ICD-10-CM | POA: Diagnosis not present

## 2023-02-01 DIAGNOSIS — Z9981 Dependence on supplemental oxygen: Secondary | ICD-10-CM | POA: Diagnosis not present

## 2023-02-01 DIAGNOSIS — I4891 Unspecified atrial fibrillation: Secondary | ICD-10-CM | POA: Diagnosis not present

## 2023-02-01 DIAGNOSIS — I350 Nonrheumatic aortic (valve) stenosis: Secondary | ICD-10-CM | POA: Diagnosis not present

## 2023-02-01 DIAGNOSIS — M8088XD Other osteoporosis with current pathological fracture, vertebra(e), subsequent encounter for fracture with routine healing: Secondary | ICD-10-CM | POA: Diagnosis not present

## 2023-02-01 DIAGNOSIS — E785 Hyperlipidemia, unspecified: Secondary | ICD-10-CM | POA: Diagnosis not present

## 2023-02-01 NOTE — Patient Instructions (Signed)
Visit Information  Thank you for taking time to visit with me today. Please don't hesitate to contact me if I can be of assistance to you.   Following are the goals we discussed today:   Goals Addressed             This Visit's Progress    COMPLETED: Care Coordination Activities-No follow up required       Care Coordination Interventions: Evaluation of current treatment plan related to HTN and Lung Cancer and patient's adherence to plan as established by provider Emotional Support Provided Discussed services and support. Assessed SDOH. Advised to discuss with primary care physician if services needed in the future. Spoke with patient. She reports she is doing okay since being home. She manages on her own with no issues.  She reports her daughter manages medications,  He son takes her to appointments but he  lives in [place].  Discussed talking with PCP office about alternate means for transportation to appointments.  She verbalized understanding.  Patient declines further follow up from nurse at this time.            If you are experiencing a Mental Health or Behavioral Health Crisis or need someone to talk to, please call the Suicide and Crisis Lifeline: 988   The patient verbalized understanding of instructions, educational materials, and care plan provided today and DECLINED offer to receive copy of patient instructions, educational materials, and care plan.   The patient has been provided with contact information for the care management team and has been advised to call with any health related questions or concerns.   Bary Leriche, RN, MSN RN Care Manager [facility], Population Health Direct Dial: [phone]  Fax: [phone] Website: [email or web address]

## 2023-02-01 NOTE — Patient Outreach (Signed)
  Care Coordination   Initial Visit Note   02/01/2023 Name: Sheena [name] [facility] MRN: [id number] DOB: 1945-12-11  Sheena [name] is a 77 y.o. year old female who sees Durene Cal, Aldine Contes, MD for primary care. I spoke with  Juleen Starr [facility] by phone today.  What matters to the patients health and wellness today?  Nothing voices by patient.    Goals Addressed             This Visit's Progress    COMPLETED: Care Coordination Activities-No follow up required       Care Coordination Interventions: Evaluation of current treatment plan related to HTN and Lung Cancer and patient's adherence to plan as established by provider Emotional Support Provided Discussed services and support. Assessed SDOH. Advised to discuss with primary care physician if services needed in the future. Spoke with patient. She reports she is doing okay since being home. She manages on her own with no issues.  She reports her daughter manages medications,  He son takes her to appointments but he  lives in [place].  Discussed talking with PCP office about alternate means for transportation to appointments.  She verbalized understanding.  Patient declines further follow up from nurse at this time.           SDOH assessments and interventions completed:  Yes  SDOH Interventions Today    Flowsheet Row Most Recent Value  SDOH Interventions   Food Insecurity Interventions Intervention Not Indicated  Housing Interventions Intervention Not Indicated  Transportation Interventions Intervention Not Indicated  Utilities Interventions Intervention Not Indicated        Care Coordination Interventions:  Yes, provided   Follow up plan: No further intervention required.   Encounter Outcome:  Patient Visit Completed   Bary Leriche, RN, MSN RN Care Manager [facility], Population Health Direct Dial: [phone]  Fax: [phone] Website: [email or web address]

## 2023-02-02 ENCOUNTER — Telehealth: Payer: Self-pay | Admitting: Family Medicine

## 2023-02-02 NOTE — Telephone Encounter (Signed)
Home Health Verbal Orders  Agency:  [facility]  Caller:  Arin  Contact and title  Requesting OT/ PT/ Skilled nursing/ Social Work/ Speech:    Reason for Request:  Verbal order - PT 1 time a week for 7 weeks  Frequency:    HH needs F2F w/in last 30 days     [phone]

## 2023-02-03 ENCOUNTER — Ambulatory Visit: Payer: 59 | Admitting: Family Medicine

## 2023-02-03 ENCOUNTER — Inpatient Hospital Stay: Payer: 59 | Attending: Internal Medicine | Admitting: Internal Medicine

## 2023-02-03 ENCOUNTER — Encounter: Payer: Self-pay | Admitting: Family Medicine

## 2023-02-03 VITALS — BP 133/66 | HR 66 | Temp 98.2°F | Resp 18 | Ht 60.0 in | Wt 87.5 lb

## 2023-02-03 VITALS — BP 168/57 | HR 63 | Temp 97.3°F | Resp 15 | Ht 60.0 in | Wt 87.5 lb

## 2023-02-03 DIAGNOSIS — Z87891 Personal history of nicotine dependence: Secondary | ICD-10-CM | POA: Insufficient documentation

## 2023-02-03 DIAGNOSIS — C3490 Malignant neoplasm of unspecified part of unspecified bronchus or lung: Secondary | ICD-10-CM

## 2023-02-03 DIAGNOSIS — J90 Pleural effusion, not elsewhere classified: Secondary | ICD-10-CM | POA: Insufficient documentation

## 2023-02-03 DIAGNOSIS — Z79899 Other long term (current) drug therapy: Secondary | ICD-10-CM | POA: Insufficient documentation

## 2023-02-03 DIAGNOSIS — C3432 Malignant neoplasm of lower lobe, left bronchus or lung: Secondary | ICD-10-CM

## 2023-02-03 DIAGNOSIS — J9601 Acute respiratory failure with hypoxia: Secondary | ICD-10-CM

## 2023-02-03 DIAGNOSIS — S32010D Wedge compression fracture of first lumbar vertebra, subsequent encounter for fracture with routine healing: Secondary | ICD-10-CM

## 2023-02-03 DIAGNOSIS — Z8781 Personal history of (healed) traumatic fracture: Secondary | ICD-10-CM | POA: Diagnosis not present

## 2023-02-03 MED ORDER — TRAMADOL HCL 50 MG PO TABS: 50.0000 mg | ORAL_TABLET | Freq: Two times a day (BID) | ORAL | 2 refills | Status: DC | PRN

## 2023-02-03 NOTE — Progress Notes (Signed)
[facility] Telephone:([phone]   Fax:([phone]  OFFICE PROGRESS NOTE  [name], MD [address] [place] [address]  DIAGNOSIS: limited stage (T2a, N2, M0) small cell lung cancer presented with left lower lobe lung mass in addition to left hilar and mediastinal lymphadenopathy diagnosed in April 2024.   PRIOR THERAPY: Systemic therapy with carboplatin for AUC of 5 on day 1 and etoposide 100 Mg/M2 on days 1, 2 and 3 every 3 weeks concurrent with radiation.  She is status post 4 cycles.  She declined prophylactic cranial irradiation.  CURRENT THERAPY: Observation.  INTERVAL HISTORY: Sheena [name] 77 y.o. female returns to the clinic today for follow-up visit accompanied by her son.Discussed the use of AI scribe software for clinical note transcription with the patient, who gave verbal consent to proceed.  History of Present Illness   The patient, a 77 year old individual diagnosed with limited stage small cell lung cancer in April 2024, underwent four cycles of chemotherapy with carboplatin and etoposide, along with concurrent radiation therapy. The most recent scan in August showed no signs of disease progression, and the patient opted for a watchful waiting approach. The patient declined prophylactic cranial irradiation but underwent a brain MRI.  Recently, the patient has been experiencing generalized weakness and imbalance, particularly noticeable when climbing stairs. The patient denies shortness of breath but reports difficulty lifting her feet. A recent MRI of the brain revealed a small area on the skull that could potentially be bone metastasis, but the brain parenchyma appeared unaffected.  A recent chest scan showed the development of a small amount of fluid around the left lung.       MEDICAL HISTORY: Past Medical History:  Diagnosis Date   Anxiety    Heart murmur    Hypertension    Primary small cell carcinoma of lower  lobe of left lung (HCC)    Scarlet fever with other complications childhood   "had to learn to work again" has had leg weakness    ALLERGIES:  is allergic to antihistamines, diphenhydramine-type; aspirin; and evista [raloxifene].  MEDICATIONS:  Current Outpatient Medications  Medication Sig Dispense Refill   acetaminophen (TYLENOL) 500 MG tablet Take 1,000 mg by mouth 2 (two) times daily as needed for mild pain.     amiodarone (PACERONE) 200 MG tablet Take 1 tablet (200 mg total) by mouth daily. 90 tablet 3   atorvastatin (LIPITOR) 20 MG tablet TAKE 1 TABLET BY MOUTH EVERY DAY (Patient taking differently: Take 20 mg by mouth daily.) 90 tablet 3   Calcium Citrate-Vitamin D (CALCIUM CITRATE PETITE/VIT D PO) Take 1 tablet by mouth 2 (two) times daily.     calcium-vitamin D (OYSTER CALCIUM 500 + D) 500-200 MG-UNIT tablet Take 1 tablet by mouth 2 (two) times daily.     famotidine (PEPCID) 20 MG tablet Take 20 mg by mouth daily as needed for heartburn.     lidocaine-prilocaine (EMLA) cream Apply 1 Application topically as needed. 30 g 2   Magnesium Citrate 125 MG CAPS Take 2 capsules by mouth 2 (two) times daily. (Patient taking differently: Take 125 mg by mouth 2 (two) times daily.) 90 capsule 3   magnesium oxide (MAG-OX) 400 MG tablet Take 1 tablet by mouth daily.     metoprolol tartrate (LOPRESSOR) 25 MG tablet Take 1 tablet (25 mg total) by mouth 2 (two) times daily. 180 tablet 3   mirtazapine (REMERON) 7.5 MG tablet Take 1 tablet (  7.5 mg total) by mouth at bedtime.     oxyCODONE (OXY IR/ROXICODONE) 5 MG immediate release tablet Take 1 tablet (5 mg total) by mouth every 6 (six) hours as needed for severe pain (pain score 7-10). 30 tablet 0   polyethylene glycol (MIRALAX / GLYCOLAX) 17 g packet Take 17 g by mouth daily as needed for mild constipation. 14 each 0   PRESCRIPTION MEDICATION Apply 1 application  topically in the morning and at bedtime. Unknown cream given to Pt from the cancer center  for after radiation     prochlorperazine (COMPAZINE) 10 MG tablet Take 1 tablet (10 mg total) by mouth every 6 (six) hours as needed for nausea or vomiting. 30 tablet 2   sucralfate (CARAFATE) 1 GM/10ML suspension Take 10 mLs (1 g total) by mouth 4 (four) times daily -  with meals and at bedtime. 420 mL 1   traMADol (ULTRAM) 50 MG tablet Take 1 tablet (50 mg total) by mouth 2 (two) times daily as needed. 60 tablet 2   No current facility-administered medications for this visit.    SURGICAL HISTORY:  Past Surgical History:  Procedure Laterality Date   BRONCHIAL NEEDLE ASPIRATION BIOPSY  06/30/2022   Procedure: BRONCHIAL NEEDLE ASPIRATION BIOPSIES;  Surgeon: [name], DO;  Location: [facility];  Service: Pulmonary;;   IR IMAGING GUIDED PORT INSERTION  07/31/2022   IR KYPHO LUMBAR INC FX REDUCE BONE BX UNI/BIL CANNULATION INC/IMAGING  12/17/2022   IR US GUIDE VASC ACCESS LEFT  12/17/2022   KYPHOPLASTY     2014   peridontal surgery     VIDEO BRONCHOSCOPY WITH ENDOBRONCHIAL ULTRASOUND N/A 06/30/2022   Procedure: VIDEO BRONCHOSCOPY WITH ENDOBRONCHIAL ULTRASOUND;  Surgeon: [name], DO;  Location: [facility];  Service: Pulmonary;  Laterality: N/A;    REVIEW OF SYSTEMS:  Constitutional: positive for fatigue Eyes: negative Ears, nose, mouth, throat, and face: negative Respiratory: positive for dyspnea on exertion Cardiovascular: negative Gastrointestinal: negative Genitourinary:negative Integument/breast: negative Hematologic/lymphatic: negative Musculoskeletal:negative Neurological: negative Behavioral/Psych: negative Endocrine: negative Allergic/Immunologic: negative   PHYSICAL EXAMINATION: General appearance: alert, cooperative, fatigued, and no distress Head: Normocephalic, without obvious abnormality, atraumatic Neck: no adenopathy, no JVD, supple, symmetrical, trachea midline, and thyroid not enlarged, symmetric, no tenderness/mass/nodules Lymph nodes: Cervical,  supraclavicular, and axillary nodes normal. Resp: clear to auscultation bilaterally Back: symmetric, no curvature. ROM normal. No CVA tenderness. Cardio: regular rate and rhythm, S1, S2 normal, no murmur, click, rub or gallop GI: soft, non-tender; bowel sounds normal; no masses,  no organomegaly Extremities: extremities normal, atraumatic, no cyanosis or edema Neurologic: Alert and oriented X 3, normal strength and tone. Normal symmetric reflexes. Normal coordination and gait  ECOG PERFORMANCE STATUS: 1 - Symptomatic but completely ambulatory  Blood pressure (!) 168/57, pulse 63, temperature (!) 97.3 F (36.3 C), temperature source Temporal, resp. rate 15, height 5' (1.524 m), weight 87 lb 8 oz (39.7 kg), SpO2 100%.  LABORATORY DATA: Lab Results  Component Value Date   WBC 7.4 01/22/2023   HGB 10.3 (L) 01/22/2023   HCT 32.2 (L) 01/22/2023   MCV 106.6 (H) 01/22/2023   PLT 191 01/22/2023      Chemistry      Component Value Date/Time   NA 136 01/22/2023 1304   K 4.7 01/22/2023 1304   CL 100 01/22/2023 1304   CO2 31 01/22/2023 1304   BUN 15 01/22/2023 1304   CREATININE 1.03 (H) 01/22/2023 1304   CREATININE 1.18 (H) 02/19/2020 1048  Component Value Date/Time   CALCIUM 9.9 01/22/2023 1304   ALKPHOS 71 01/22/2023 1304   AST 25 01/22/2023 1304   ALT 16 01/22/2023 1304   BILITOT 0.4 01/22/2023 1304       RADIOGRAPHIC STUDIES: MR Brain W Wo Contrast  Result Date: 01/31/2023 CLINICAL DATA:  One history: Primary small cell carcinoma of lower lobe of left lung. Malignant neoplasm of unspecified part of unspecified bronchus or lung. Small cell lung cancer, staging. Metastatic disease evaluation. EXAM: MRI HEAD WITHOUT AND WITH CONTRAST TECHNIQUE: Multiplanar, multiecho pulse sequences of the brain and surrounding structures were obtained without and with intravenous contrast. CONTRAST:  5mL GADAVIST GADOBUTROL 1 MMOL/ML IV SOLN COMPARISON:  Brain MRI 07/09/2022. FINDINGS: Brain:  No age advanced or lobar predominant parenchymal atrophy. Multifocal T2 FLAIR hyperintense signal abnormality within the cerebral white matter, nonspecific but compatible with mild chronic small vessel ischemic disease. There is no acute infarct. No evidence of an intracranial mass. No chronic intracranial blood products. No extra-axial fluid collection. No midline shift. No pathologic intracranial enhancement identified. Vascular: Maintained flow voids within the proximal large arterial vessels. Skull and upper cervical spine: 10 mm STIR hyperintense lesion within the zygomatic process of the left temporal bone, increased in size from the prior brain MRI of 07/09/2022 (series 10, images 6 and 7). No focal worrisome marrow lesion elsewhere within the calvarium or visualized cervical spine. Sinuses/Orbits: No mass or acute finding within the imaged orbits. Minimal mucosal thickening within the bilateral frontal, bilateral ethmoid and right maxillary sinuses. Other: Left mastoid effusion. Impression #2 will be called to the ordering clinician or representative by the Radiologist Assistant, and communication documented in the PACS or [employer]. IMPRESSION: 1. No evidence of intracranial metastatic disease. 2. 10 mm lesion within the zygomatic process of the left temporal bone. Although nonspecific, this suspicious for an osseous metastasis. 3. Mild chronic small vessel ischemic changes within the cerebral white matter, similar to the prior brain MRI of 07/09/2022. 4. Minor paranasal sinus mucosal thickening. 5. Left mastoid effusion. Electronically Signed   By: Jackey Loge D.O.   On: 01/31/2023 13:26    ASSESSMENT AND PLAN: This is a very pleasant 77 years old white female with limited stage (T2a, N2, M0) small cell lung cancer presented with left lower lobe lung mass in addition to left hilar and mediastinal lymphadenopathy diagnosed in April 2024.  The patient underwent a course of systemic chemotherapy  with carboplatin for AUC of 5 on day 1 and 2 etoposide 100 Mg/M2 on days 1, 2 and 3 concurrent with radiation.  She is status post 4 cycles. The patient had CT scan of the chest performed recently.  The final report is still pending but I personally and independently reviewed the scan and I did not see any concerning finding for disease progression except for development of a small left pleural effusion.  I will wait for the final report for confirmation of these findings.  She also had MRI of the brain that showed no evidence of intracranial metastatic disease but there was 1.0 cm lesion within zygomatic process of the left temporal bone that is nonspecific but suspicious for osseous metastasis.    Limited Stage Small Cell Lung Cancer Diagnosed in April 2024. Completed four cycles of carboplatin and etoposide with concurrent radiation therapy. August 2024 scan showed no concerning findings. Declined prophylactic cranial irradiation despite 50% risk of brain metastasis. Recent brain MRI showed no parenchymal metastasis but a possible bone metastasis on the  skull. Awaiting radiologist's report on recent chest scan. Noted small pleural effusion around the left lung, likely related to cancer, heart, or kidney issues. Consider drainage if effusion increases or causes dyspnea. - Await radiologist's report on chest scan - Monitor pleural effusion; consider drainage if it increases or causes dyspnea - Schedule follow-up scan in late February 2025 - Follow-up appointment in early March 2025  General Health Maintenance Port requires flushing every six weeks to maintain patency. - Flush port every six weeks  Follow-up - Follow-up in three months if scan is clear - Contact patient if scan shows concerning findings.   The patient was advised to call immediately if she has any concerning symptoms in the interval. The patient voices understanding of current disease status and treatment options and is in  agreement with the current care plan. All questions were answered. The patient knows to call the clinic with any problems, questions or concerns. We can certainly see the patient much sooner if necessary.  The total time spent in the appointment was 30 minutes.  Disclaimer: This note was dictated with voice recognition software. Similar sounding words can inadvertently be transcribed and may not be corrected upon review.

## 2023-02-03 NOTE — Telephone Encounter (Signed)
Verbal order given to Arin.

## 2023-02-03 NOTE — Telephone Encounter (Signed)
Pt has a hospital follow up with you today.

## 2023-02-03 NOTE — Progress Notes (Signed)
Subjective:     Patient ID: Sheena [name] [employer], female    DOB: [birth year], 77 y.o.   MRN: [id number]  Chief Complaint  Patient presents with   Hospitalization Follow-up    Follow-up from hospital on 12/09/22, recently discharged from skilled nursing facility 2 weeks ago Friday Not fasting      HPI-son brought her.  Pt lives by self.  Pt w/h/o small cell lung ca, a fib, htn,hld, osteoporosis.  Hosp f/u.  D/c from SNF 2 wks ago.Oct 9-stretched in bed and "pop" in lower back-ER. L1 Compression fx-got kyphoplasty. Hosp 10/9-10/21/24. Hosp course worsened by fever, sepsis, UTI, hypoxic resp failure    then to rehab.  Now on O2 2 L-per pt, wasn't able to wean at SNF.   Back hurting today. No fever.   Has walker-hard to push it so doesn't use it.  Using w/c if going out.   Has home PT.   Pt would like an aid if possible.    On O2 now.  Seeing pulm today.   Nutrition-not like boost  Health Maintenance Due  Topic Date Due   INFLUENZA VACCINE  Never done   Medicare Annual Wellness (AWV)  01/10/2023    Past Medical History:  Diagnosis Date   Anxiety    Heart murmur    Hypertension    Primary small cell carcinoma of lower lobe of left lung (HCC)    Scarlet fever with other complications childhood   "had to learn to work again" has had leg weakness    Past Surgical History:  Procedure Laterality Date   BRONCHIAL NEEDLE ASPIRATION BIOPSY  06/30/2022   Procedure: BRONCHIAL NEEDLE ASPIRATION BIOPSIES;  Surgeon: [name], DO;  Location: [facility];  Service: Pulmonary;;   IR IMAGING GUIDED PORT INSERTION  07/31/2022   IR KYPHO LUMBAR INC FX REDUCE BONE BX UNI/BIL CANNULATION INC/IMAGING  12/17/2022   IR US GUIDE VASC ACCESS LEFT  12/17/2022   KYPHOPLASTY     2014   peridontal surgery     VIDEO BRONCHOSCOPY WITH ENDOBRONCHIAL ULTRASOUND N/A 06/30/2022   Procedure: VIDEO BRONCHOSCOPY WITH ENDOBRONCHIAL ULTRASOUND;  Surgeon: [name], DO;  Location: [facility];   Service: Pulmonary;  Laterality: N/A;     Current Outpatient Medications:    acetaminophen (TYLENOL) 500 MG tablet, Take 1,000 mg by mouth 2 (two) times daily as needed for mild pain., Disp: , Rfl:    amiodarone (PACERONE) 200 MG tablet, Take 1 tablet (200 mg total) by mouth daily., Disp: 90 tablet, Rfl: 3   atorvastatin (LIPITOR) 20 MG tablet, TAKE 1 TABLET BY MOUTH EVERY DAY (Patient taking differently: Take 20 mg by mouth daily.), Disp: 90 tablet, Rfl: 3   Calcium Citrate-Vitamin D (CALCIUM CITRATE PETITE/VIT D PO), Take 1 tablet by mouth 2 (two) times daily., Disp: , Rfl:    calcium-vitamin D (OYSTER CALCIUM 500 + D) 500-200 MG-UNIT tablet, Take 1 tablet by mouth 2 (two) times daily., Disp: , Rfl:    famotidine (PEPCID) 20 MG tablet, Take 20 mg by mouth daily as needed for heartburn., Disp: , Rfl:    lidocaine-prilocaine (EMLA) cream, Apply 1 Application topically as needed., Disp: 30 g, Rfl: 2   Magnesium Citrate 125 MG CAPS, Take 2 capsules by mouth 2 (two) times daily. (Patient taking differently: Take 125 mg by mouth 2 (two) times daily.), Disp: 90 capsule, Rfl: 3   magnesium oxide (MAG-OX) 400 MG tablet, Take 1 tablet by mouth daily., Disp: , Rfl:  mirtazapine (REMERON) 7.5 MG tablet, Take 1 tablet (7.5 mg total) by mouth at bedtime., Disp: , Rfl:    polyethylene glycol (MIRALAX / GLYCOLAX) 17 g packet, Take 17 g by mouth daily as needed for mild constipation., Disp: 14 each, Rfl: 0   PRESCRIPTION MEDICATION, Apply 1 application  topically in the morning and at bedtime. Unknown cream given to Pt from the cancer center for after radiation, Disp: , Rfl:    prochlorperazine (COMPAZINE) 10 MG tablet, Take 1 tablet (10 mg total) by mouth every 6 (six) hours as needed for nausea or vomiting., Disp: 30 tablet, Rfl: 2   sucralfate (CARAFATE) 1 GM/10ML suspension, Take 10 mLs (1 g total) by mouth 4 (four) times daily -  with meals and at bedtime., Disp: 420 mL, Rfl: 1   metoprolol tartrate  (LOPRESSOR) 25 MG tablet, Take 1 tablet (25 mg total) by mouth 2 (two) times daily., Disp: 180 tablet, Rfl: 3   traMADol (ULTRAM) 50 MG tablet, Take 1 tablet (50 mg total) by mouth 2 (two) times daily as needed., Disp: 60 tablet, Rfl: 2  Allergies  Allergen Reactions   Antihistamines, Diphenhydramine-Type Other (See Comments)    palpitations, feel jittery   Aspirin Other (See Comments)    Stomach cramps   Evista [Raloxifene] Other (See Comments)    Hot Flashes    ROS neg/noncontributory except as noted HPI/below      Objective:     BP 133/66   Pulse 66   Temp 98.2 F (36.8 C) (Temporal)   Resp 18   Ht 5' (1.524 m)   Wt 87 lb 8 oz (39.7 kg)   SpO2 98% Comment: 2LO2  BMI 17.09 kg/m  Wt Readings from Last 3 Encounters:  02/03/23 87 lb 8 oz (39.7 kg)  02/03/23 87 lb 8 oz (39.7 kg)  12/25/22 90 lb 9.6 oz (41.1 kg)    Physical Exam   Gen: WDWN NAD thin, frail lady on O2 in w/c HEENT: NCAT, conjunctiva not injected, sclera nonicteric NECK:  supple, no thyromegaly, no nodes, no carotid bruits CARDIAC: RRR, S1S2+, no murmur. DP 2+B LUNGS: CTAB. No wheezes ABDOMEN:  BS+, soft, NTND, No HSM, no masses EXT:  no edema MSK: no gross abnormalities.  NEURO: A&O x3.  CN II-XII intact.  PSYCH: normal mood. Good eye contact  Reviewed hosp records.    Assessment & Plan:  Closed wedge compression fracture of L1 vertebra with routine healing, subsequent encounter  Primary small cell carcinoma of lower lobe of left lung (HCC)  Acute respiratory failure with hypoxia (HCC)  History of vertebral compression fracture  Other orders -     traMADol HCl; Take 1 tablet (50 mg total) by mouth 2 (two) times daily as needed.  Dispense: 60 tablet; Refill: 2  1.  Compression fracture of L1 status post kyphoplasty.  Healing.  She still has pain.  Takes tramadol 50 mg twice daily long-term.  Does need a refill.  PDMP checked.  Advised to follow-up with Dr. Durene Cal in 1 month, however patient is  overwhelmed with the amount of doctor visit she has and would prefer to wait 3 months.  She will follow-up if things worsen.  Getting home physical therapy.  Should also have nurse visits as well. 2.  Small cell carcinoma of lower lobe of left lung-status post chemo.  Sees oncology today. 3.  Acute respiratory failure with hypoxia-status post hospitalization.  Still on 2 L of oxygen as she is been unable to  wean.  Per patient, she will be managed by oncology  Return in about 3 months (around 05/04/2023) for Dr. Durene Cal-, chronic follow-up.  Angelena Sole, MD

## 2023-02-03 NOTE — Patient Instructions (Signed)
It was very nice to see you today!  Work on diet-possibly protein puddings. Happy Holidays.   PLEASE NOTE:  If you had any lab tests please let us know if you have not heard back within a few days. You may see your results on MyChart before we have a chance to review them but we will give you a call once they are reviewed by [place]. If we ordered any referrals today, please let us know if you have not heard from their office within the next week.   Please try these tips to maintain a healthy lifestyle:  Eat most of your calories during the day when you are active. Eliminate processed foods including packaged sweets (pies, cakes, cookies), reduce intake of potatoes, white bread, white pasta, and white rice. Look for whole grain options, oat flour or almond flour.  Each meal should contain half fruits/vegetables, one quarter protein, and one quarter carbs (no bigger than a computer mouse).  Cut down on sweet beverages. This includes juice, soda, and sweet tea. Also watch fruit intake, though this is a healthier sweet option, it still contains natural sugar! Limit to 3 servings daily.  Drink at least 1 glass of water with each meal and aim for at least 8 glasses per day  Exercise at least 150 minutes every week.

## 2023-02-04 ENCOUNTER — Other Ambulatory Visit: Payer: Self-pay

## 2023-02-04 DIAGNOSIS — K219 Gastro-esophageal reflux disease without esophagitis: Secondary | ICD-10-CM | POA: Diagnosis not present

## 2023-02-04 DIAGNOSIS — D630 Anemia in neoplastic disease: Secondary | ICD-10-CM | POA: Diagnosis not present

## 2023-02-04 DIAGNOSIS — I4891 Unspecified atrial fibrillation: Secondary | ICD-10-CM | POA: Diagnosis not present

## 2023-02-04 DIAGNOSIS — N390 Urinary tract infection, site not specified: Secondary | ICD-10-CM | POA: Diagnosis not present

## 2023-02-04 DIAGNOSIS — J449 Chronic obstructive pulmonary disease, unspecified: Secondary | ICD-10-CM | POA: Diagnosis not present

## 2023-02-04 DIAGNOSIS — M8088XD Other osteoporosis with current pathological fracture, vertebra(e), subsequent encounter for fracture with routine healing: Secondary | ICD-10-CM | POA: Diagnosis not present

## 2023-02-04 DIAGNOSIS — G8929 Other chronic pain: Secondary | ICD-10-CM | POA: Diagnosis not present

## 2023-02-04 DIAGNOSIS — E785 Hyperlipidemia, unspecified: Secondary | ICD-10-CM | POA: Diagnosis not present

## 2023-02-04 DIAGNOSIS — J9601 Acute respiratory failure with hypoxia: Secondary | ICD-10-CM | POA: Diagnosis not present

## 2023-02-04 DIAGNOSIS — C3432 Malignant neoplasm of lower lobe, left bronchus or lung: Secondary | ICD-10-CM | POA: Diagnosis not present

## 2023-02-04 DIAGNOSIS — Z87891 Personal history of nicotine dependence: Secondary | ICD-10-CM | POA: Diagnosis not present

## 2023-02-04 DIAGNOSIS — I350 Nonrheumatic aortic (valve) stenosis: Secondary | ICD-10-CM | POA: Diagnosis not present

## 2023-02-04 DIAGNOSIS — Z79891 Long term (current) use of opiate analgesic: Secondary | ICD-10-CM | POA: Diagnosis not present

## 2023-02-04 DIAGNOSIS — I10 Essential (primary) hypertension: Secondary | ICD-10-CM | POA: Diagnosis not present

## 2023-02-04 DIAGNOSIS — Z9981 Dependence on supplemental oxygen: Secondary | ICD-10-CM | POA: Diagnosis not present

## 2023-02-04 DIAGNOSIS — Z87440 Personal history of urinary (tract) infections: Secondary | ICD-10-CM | POA: Diagnosis not present

## 2023-02-05 ENCOUNTER — Other Ambulatory Visit: Payer: Self-pay

## 2023-02-05 ENCOUNTER — Telehealth: Payer: Self-pay | Admitting: Family Medicine

## 2023-02-05 NOTE — Telephone Encounter (Signed)
Received faxed document Home Health Certificate (Order ID [id number] ), to be filled out by provider. Patient requested to send it back via Fax .Document is located in providers tray at front office.Please advise.

## 2023-02-08 NOTE — Telephone Encounter (Signed)
Forms completed and faxed.

## 2023-02-09 ENCOUNTER — Other Ambulatory Visit: Payer: Self-pay

## 2023-02-09 DIAGNOSIS — N390 Urinary tract infection, site not specified: Secondary | ICD-10-CM | POA: Diagnosis not present

## 2023-02-09 DIAGNOSIS — Z87440 Personal history of urinary (tract) infections: Secondary | ICD-10-CM | POA: Diagnosis not present

## 2023-02-09 DIAGNOSIS — I4891 Unspecified atrial fibrillation: Secondary | ICD-10-CM | POA: Diagnosis not present

## 2023-02-09 DIAGNOSIS — E785 Hyperlipidemia, unspecified: Secondary | ICD-10-CM | POA: Diagnosis not present

## 2023-02-09 DIAGNOSIS — Z87891 Personal history of nicotine dependence: Secondary | ICD-10-CM | POA: Diagnosis not present

## 2023-02-09 DIAGNOSIS — J9601 Acute respiratory failure with hypoxia: Secondary | ICD-10-CM | POA: Diagnosis not present

## 2023-02-09 DIAGNOSIS — I350 Nonrheumatic aortic (valve) stenosis: Secondary | ICD-10-CM | POA: Diagnosis not present

## 2023-02-09 DIAGNOSIS — G8929 Other chronic pain: Secondary | ICD-10-CM | POA: Diagnosis not present

## 2023-02-09 DIAGNOSIS — Z79891 Long term (current) use of opiate analgesic: Secondary | ICD-10-CM | POA: Diagnosis not present

## 2023-02-09 DIAGNOSIS — Z9981 Dependence on supplemental oxygen: Secondary | ICD-10-CM | POA: Diagnosis not present

## 2023-02-09 DIAGNOSIS — J449 Chronic obstructive pulmonary disease, unspecified: Secondary | ICD-10-CM | POA: Diagnosis not present

## 2023-02-09 DIAGNOSIS — M8088XD Other osteoporosis with current pathological fracture, vertebra(e), subsequent encounter for fracture with routine healing: Secondary | ICD-10-CM | POA: Diagnosis not present

## 2023-02-09 DIAGNOSIS — I10 Essential (primary) hypertension: Secondary | ICD-10-CM | POA: Diagnosis not present

## 2023-02-09 DIAGNOSIS — K219 Gastro-esophageal reflux disease without esophagitis: Secondary | ICD-10-CM | POA: Diagnosis not present

## 2023-02-09 DIAGNOSIS — C3432 Malignant neoplasm of lower lobe, left bronchus or lung: Secondary | ICD-10-CM | POA: Diagnosis not present

## 2023-02-09 DIAGNOSIS — D630 Anemia in neoplastic disease: Secondary | ICD-10-CM | POA: Diagnosis not present

## 2023-02-11 DIAGNOSIS — Z87440 Personal history of urinary (tract) infections: Secondary | ICD-10-CM | POA: Diagnosis not present

## 2023-02-11 DIAGNOSIS — E785 Hyperlipidemia, unspecified: Secondary | ICD-10-CM | POA: Diagnosis not present

## 2023-02-11 DIAGNOSIS — N390 Urinary tract infection, site not specified: Secondary | ICD-10-CM | POA: Diagnosis not present

## 2023-02-11 DIAGNOSIS — C3432 Malignant neoplasm of lower lobe, left bronchus or lung: Secondary | ICD-10-CM | POA: Diagnosis not present

## 2023-02-11 DIAGNOSIS — I350 Nonrheumatic aortic (valve) stenosis: Secondary | ICD-10-CM | POA: Diagnosis not present

## 2023-02-11 DIAGNOSIS — Z79891 Long term (current) use of opiate analgesic: Secondary | ICD-10-CM | POA: Diagnosis not present

## 2023-02-11 DIAGNOSIS — I4891 Unspecified atrial fibrillation: Secondary | ICD-10-CM | POA: Diagnosis not present

## 2023-02-11 DIAGNOSIS — M8088XD Other osteoporosis with current pathological fracture, vertebra(e), subsequent encounter for fracture with routine healing: Secondary | ICD-10-CM | POA: Diagnosis not present

## 2023-02-11 DIAGNOSIS — J9601 Acute respiratory failure with hypoxia: Secondary | ICD-10-CM | POA: Diagnosis not present

## 2023-02-11 DIAGNOSIS — J449 Chronic obstructive pulmonary disease, unspecified: Secondary | ICD-10-CM | POA: Diagnosis not present

## 2023-02-11 DIAGNOSIS — Z9981 Dependence on supplemental oxygen: Secondary | ICD-10-CM | POA: Diagnosis not present

## 2023-02-11 DIAGNOSIS — D630 Anemia in neoplastic disease: Secondary | ICD-10-CM | POA: Diagnosis not present

## 2023-02-11 DIAGNOSIS — Z87891 Personal history of nicotine dependence: Secondary | ICD-10-CM | POA: Diagnosis not present

## 2023-02-11 DIAGNOSIS — G8929 Other chronic pain: Secondary | ICD-10-CM | POA: Diagnosis not present

## 2023-02-11 DIAGNOSIS — I10 Essential (primary) hypertension: Secondary | ICD-10-CM | POA: Diagnosis not present

## 2023-02-11 DIAGNOSIS — K219 Gastro-esophageal reflux disease without esophagitis: Secondary | ICD-10-CM | POA: Diagnosis not present

## 2023-02-15 DIAGNOSIS — J449 Chronic obstructive pulmonary disease, unspecified: Secondary | ICD-10-CM | POA: Diagnosis not present

## 2023-02-15 DIAGNOSIS — K219 Gastro-esophageal reflux disease without esophagitis: Secondary | ICD-10-CM | POA: Diagnosis not present

## 2023-02-15 DIAGNOSIS — Z87891 Personal history of nicotine dependence: Secondary | ICD-10-CM | POA: Diagnosis not present

## 2023-02-15 DIAGNOSIS — N390 Urinary tract infection, site not specified: Secondary | ICD-10-CM | POA: Diagnosis not present

## 2023-02-15 DIAGNOSIS — Z9981 Dependence on supplemental oxygen: Secondary | ICD-10-CM | POA: Diagnosis not present

## 2023-02-15 DIAGNOSIS — D630 Anemia in neoplastic disease: Secondary | ICD-10-CM | POA: Diagnosis not present

## 2023-02-15 DIAGNOSIS — J9601 Acute respiratory failure with hypoxia: Secondary | ICD-10-CM | POA: Diagnosis not present

## 2023-02-15 DIAGNOSIS — I10 Essential (primary) hypertension: Secondary | ICD-10-CM | POA: Diagnosis not present

## 2023-02-15 DIAGNOSIS — I4891 Unspecified atrial fibrillation: Secondary | ICD-10-CM | POA: Diagnosis not present

## 2023-02-15 DIAGNOSIS — I350 Nonrheumatic aortic (valve) stenosis: Secondary | ICD-10-CM | POA: Diagnosis not present

## 2023-02-15 DIAGNOSIS — Z79891 Long term (current) use of opiate analgesic: Secondary | ICD-10-CM | POA: Diagnosis not present

## 2023-02-15 DIAGNOSIS — M8088XD Other osteoporosis with current pathological fracture, vertebra(e), subsequent encounter for fracture with routine healing: Secondary | ICD-10-CM | POA: Diagnosis not present

## 2023-02-15 DIAGNOSIS — E785 Hyperlipidemia, unspecified: Secondary | ICD-10-CM | POA: Diagnosis not present

## 2023-02-15 DIAGNOSIS — Z87440 Personal history of urinary (tract) infections: Secondary | ICD-10-CM | POA: Diagnosis not present

## 2023-02-15 DIAGNOSIS — G8929 Other chronic pain: Secondary | ICD-10-CM | POA: Diagnosis not present

## 2023-02-15 DIAGNOSIS — C3432 Malignant neoplasm of lower lobe, left bronchus or lung: Secondary | ICD-10-CM | POA: Diagnosis not present

## 2023-02-17 DIAGNOSIS — I10 Essential (primary) hypertension: Secondary | ICD-10-CM | POA: Diagnosis not present

## 2023-02-17 DIAGNOSIS — D630 Anemia in neoplastic disease: Secondary | ICD-10-CM | POA: Diagnosis not present

## 2023-02-17 DIAGNOSIS — G8929 Other chronic pain: Secondary | ICD-10-CM | POA: Diagnosis not present

## 2023-02-17 DIAGNOSIS — K219 Gastro-esophageal reflux disease without esophagitis: Secondary | ICD-10-CM | POA: Diagnosis not present

## 2023-02-17 DIAGNOSIS — Z87440 Personal history of urinary (tract) infections: Secondary | ICD-10-CM | POA: Diagnosis not present

## 2023-02-17 DIAGNOSIS — Z87891 Personal history of nicotine dependence: Secondary | ICD-10-CM | POA: Diagnosis not present

## 2023-02-17 DIAGNOSIS — M8088XD Other osteoporosis with current pathological fracture, vertebra(e), subsequent encounter for fracture with routine healing: Secondary | ICD-10-CM | POA: Diagnosis not present

## 2023-02-17 DIAGNOSIS — Z79891 Long term (current) use of opiate analgesic: Secondary | ICD-10-CM | POA: Diagnosis not present

## 2023-02-17 DIAGNOSIS — J9601 Acute respiratory failure with hypoxia: Secondary | ICD-10-CM | POA: Diagnosis not present

## 2023-02-17 DIAGNOSIS — J449 Chronic obstructive pulmonary disease, unspecified: Secondary | ICD-10-CM | POA: Diagnosis not present

## 2023-02-17 DIAGNOSIS — Z9981 Dependence on supplemental oxygen: Secondary | ICD-10-CM | POA: Diagnosis not present

## 2023-02-17 DIAGNOSIS — I350 Nonrheumatic aortic (valve) stenosis: Secondary | ICD-10-CM | POA: Diagnosis not present

## 2023-02-17 DIAGNOSIS — N390 Urinary tract infection, site not specified: Secondary | ICD-10-CM | POA: Diagnosis not present

## 2023-02-17 DIAGNOSIS — I4891 Unspecified atrial fibrillation: Secondary | ICD-10-CM | POA: Diagnosis not present

## 2023-02-17 DIAGNOSIS — E785 Hyperlipidemia, unspecified: Secondary | ICD-10-CM | POA: Diagnosis not present

## 2023-02-17 DIAGNOSIS — C3432 Malignant neoplasm of lower lobe, left bronchus or lung: Secondary | ICD-10-CM | POA: Diagnosis not present

## 2023-02-21 DIAGNOSIS — J439 Emphysema, unspecified: Secondary | ICD-10-CM | POA: Diagnosis not present

## 2023-02-21 DIAGNOSIS — J449 Chronic obstructive pulmonary disease, unspecified: Secondary | ICD-10-CM | POA: Diagnosis not present

## 2023-02-21 DIAGNOSIS — C3490 Malignant neoplasm of unspecified part of unspecified bronchus or lung: Secondary | ICD-10-CM | POA: Diagnosis not present

## 2023-02-21 DIAGNOSIS — J9601 Acute respiratory failure with hypoxia: Secondary | ICD-10-CM | POA: Diagnosis not present

## 2023-02-21 DIAGNOSIS — S32009D Unspecified fracture of unspecified lumbar vertebra, subsequent encounter for fracture with routine healing: Secondary | ICD-10-CM | POA: Diagnosis not present

## 2023-02-26 DIAGNOSIS — N390 Urinary tract infection, site not specified: Secondary | ICD-10-CM | POA: Diagnosis not present

## 2023-02-26 DIAGNOSIS — J9601 Acute respiratory failure with hypoxia: Secondary | ICD-10-CM | POA: Diagnosis not present

## 2023-02-26 DIAGNOSIS — D630 Anemia in neoplastic disease: Secondary | ICD-10-CM | POA: Diagnosis not present

## 2023-02-26 DIAGNOSIS — I10 Essential (primary) hypertension: Secondary | ICD-10-CM | POA: Diagnosis not present

## 2023-02-26 DIAGNOSIS — I4891 Unspecified atrial fibrillation: Secondary | ICD-10-CM | POA: Diagnosis not present

## 2023-02-26 DIAGNOSIS — J449 Chronic obstructive pulmonary disease, unspecified: Secondary | ICD-10-CM | POA: Diagnosis not present

## 2023-02-26 DIAGNOSIS — Z87440 Personal history of urinary (tract) infections: Secondary | ICD-10-CM | POA: Diagnosis not present

## 2023-02-26 DIAGNOSIS — C3432 Malignant neoplasm of lower lobe, left bronchus or lung: Secondary | ICD-10-CM | POA: Diagnosis not present

## 2023-02-26 DIAGNOSIS — G8929 Other chronic pain: Secondary | ICD-10-CM | POA: Diagnosis not present

## 2023-02-26 DIAGNOSIS — K219 Gastro-esophageal reflux disease without esophagitis: Secondary | ICD-10-CM | POA: Diagnosis not present

## 2023-02-26 DIAGNOSIS — I350 Nonrheumatic aortic (valve) stenosis: Secondary | ICD-10-CM | POA: Diagnosis not present

## 2023-02-26 DIAGNOSIS — Z79891 Long term (current) use of opiate analgesic: Secondary | ICD-10-CM | POA: Diagnosis not present

## 2023-02-26 DIAGNOSIS — Z87891 Personal history of nicotine dependence: Secondary | ICD-10-CM | POA: Diagnosis not present

## 2023-02-26 DIAGNOSIS — Z9981 Dependence on supplemental oxygen: Secondary | ICD-10-CM | POA: Diagnosis not present

## 2023-02-26 DIAGNOSIS — M8088XD Other osteoporosis with current pathological fracture, vertebra(e), subsequent encounter for fracture with routine healing: Secondary | ICD-10-CM | POA: Diagnosis not present

## 2023-02-26 DIAGNOSIS — E785 Hyperlipidemia, unspecified: Secondary | ICD-10-CM | POA: Diagnosis not present

## 2023-03-02 DIAGNOSIS — J449 Chronic obstructive pulmonary disease, unspecified: Secondary | ICD-10-CM | POA: Diagnosis not present

## 2023-03-02 DIAGNOSIS — C3432 Malignant neoplasm of lower lobe, left bronchus or lung: Secondary | ICD-10-CM | POA: Diagnosis not present

## 2023-03-02 DIAGNOSIS — J9601 Acute respiratory failure with hypoxia: Secondary | ICD-10-CM | POA: Diagnosis not present

## 2023-03-02 DIAGNOSIS — D630 Anemia in neoplastic disease: Secondary | ICD-10-CM | POA: Diagnosis not present

## 2023-03-02 DIAGNOSIS — M8088XD Other osteoporosis with current pathological fracture, vertebra(e), subsequent encounter for fracture with routine healing: Secondary | ICD-10-CM | POA: Diagnosis not present

## 2023-03-02 DIAGNOSIS — Z87891 Personal history of nicotine dependence: Secondary | ICD-10-CM | POA: Diagnosis not present

## 2023-03-02 DIAGNOSIS — Z9981 Dependence on supplemental oxygen: Secondary | ICD-10-CM | POA: Diagnosis not present

## 2023-03-02 DIAGNOSIS — K219 Gastro-esophageal reflux disease without esophagitis: Secondary | ICD-10-CM | POA: Diagnosis not present

## 2023-03-02 DIAGNOSIS — Z87440 Personal history of urinary (tract) infections: Secondary | ICD-10-CM | POA: Diagnosis not present

## 2023-03-02 DIAGNOSIS — I10 Essential (primary) hypertension: Secondary | ICD-10-CM | POA: Diagnosis not present

## 2023-03-02 DIAGNOSIS — I4891 Unspecified atrial fibrillation: Secondary | ICD-10-CM | POA: Diagnosis not present

## 2023-03-02 DIAGNOSIS — E785 Hyperlipidemia, unspecified: Secondary | ICD-10-CM | POA: Diagnosis not present

## 2023-03-02 DIAGNOSIS — G8929 Other chronic pain: Secondary | ICD-10-CM | POA: Diagnosis not present

## 2023-03-02 DIAGNOSIS — I350 Nonrheumatic aortic (valve) stenosis: Secondary | ICD-10-CM | POA: Diagnosis not present

## 2023-03-02 DIAGNOSIS — Z79891 Long term (current) use of opiate analgesic: Secondary | ICD-10-CM | POA: Diagnosis not present

## 2023-03-02 DIAGNOSIS — N390 Urinary tract infection, site not specified: Secondary | ICD-10-CM | POA: Diagnosis not present

## 2023-03-09 DIAGNOSIS — M8088XD Other osteoporosis with current pathological fracture, vertebra(e), subsequent encounter for fracture with routine healing: Secondary | ICD-10-CM | POA: Diagnosis not present

## 2023-03-09 DIAGNOSIS — I10 Essential (primary) hypertension: Secondary | ICD-10-CM | POA: Diagnosis not present

## 2023-03-09 DIAGNOSIS — C3432 Malignant neoplasm of lower lobe, left bronchus or lung: Secondary | ICD-10-CM | POA: Diagnosis not present

## 2023-03-09 DIAGNOSIS — I350 Nonrheumatic aortic (valve) stenosis: Secondary | ICD-10-CM | POA: Diagnosis not present

## 2023-03-09 DIAGNOSIS — E785 Hyperlipidemia, unspecified: Secondary | ICD-10-CM | POA: Diagnosis not present

## 2023-03-09 DIAGNOSIS — I4891 Unspecified atrial fibrillation: Secondary | ICD-10-CM | POA: Diagnosis not present

## 2023-03-09 DIAGNOSIS — Z87891 Personal history of nicotine dependence: Secondary | ICD-10-CM | POA: Diagnosis not present

## 2023-03-09 DIAGNOSIS — Z87440 Personal history of urinary (tract) infections: Secondary | ICD-10-CM | POA: Diagnosis not present

## 2023-03-09 DIAGNOSIS — Z9981 Dependence on supplemental oxygen: Secondary | ICD-10-CM | POA: Diagnosis not present

## 2023-03-09 DIAGNOSIS — N390 Urinary tract infection, site not specified: Secondary | ICD-10-CM | POA: Diagnosis not present

## 2023-03-09 DIAGNOSIS — J9601 Acute respiratory failure with hypoxia: Secondary | ICD-10-CM | POA: Diagnosis not present

## 2023-03-09 DIAGNOSIS — D630 Anemia in neoplastic disease: Secondary | ICD-10-CM | POA: Diagnosis not present

## 2023-03-09 DIAGNOSIS — K219 Gastro-esophageal reflux disease without esophagitis: Secondary | ICD-10-CM | POA: Diagnosis not present

## 2023-03-09 DIAGNOSIS — Z79891 Long term (current) use of opiate analgesic: Secondary | ICD-10-CM | POA: Diagnosis not present

## 2023-03-09 DIAGNOSIS — G8929 Other chronic pain: Secondary | ICD-10-CM | POA: Diagnosis not present

## 2023-03-09 DIAGNOSIS — J449 Chronic obstructive pulmonary disease, unspecified: Secondary | ICD-10-CM | POA: Diagnosis not present

## 2023-03-15 DIAGNOSIS — Z87440 Personal history of urinary (tract) infections: Secondary | ICD-10-CM | POA: Diagnosis not present

## 2023-03-15 DIAGNOSIS — G8929 Other chronic pain: Secondary | ICD-10-CM | POA: Diagnosis not present

## 2023-03-15 DIAGNOSIS — J449 Chronic obstructive pulmonary disease, unspecified: Secondary | ICD-10-CM | POA: Diagnosis not present

## 2023-03-15 DIAGNOSIS — N390 Urinary tract infection, site not specified: Secondary | ICD-10-CM | POA: Diagnosis not present

## 2023-03-15 DIAGNOSIS — J9601 Acute respiratory failure with hypoxia: Secondary | ICD-10-CM | POA: Diagnosis not present

## 2023-03-15 DIAGNOSIS — C3432 Malignant neoplasm of lower lobe, left bronchus or lung: Secondary | ICD-10-CM | POA: Diagnosis not present

## 2023-03-15 DIAGNOSIS — K219 Gastro-esophageal reflux disease without esophagitis: Secondary | ICD-10-CM | POA: Diagnosis not present

## 2023-03-15 DIAGNOSIS — Z79891 Long term (current) use of opiate analgesic: Secondary | ICD-10-CM | POA: Diagnosis not present

## 2023-03-15 DIAGNOSIS — I10 Essential (primary) hypertension: Secondary | ICD-10-CM | POA: Diagnosis not present

## 2023-03-15 DIAGNOSIS — Z87891 Personal history of nicotine dependence: Secondary | ICD-10-CM | POA: Diagnosis not present

## 2023-03-15 DIAGNOSIS — E785 Hyperlipidemia, unspecified: Secondary | ICD-10-CM | POA: Diagnosis not present

## 2023-03-15 DIAGNOSIS — Z9981 Dependence on supplemental oxygen: Secondary | ICD-10-CM | POA: Diagnosis not present

## 2023-03-15 DIAGNOSIS — D630 Anemia in neoplastic disease: Secondary | ICD-10-CM | POA: Diagnosis not present

## 2023-03-15 DIAGNOSIS — M8088XD Other osteoporosis with current pathological fracture, vertebra(e), subsequent encounter for fracture with routine healing: Secondary | ICD-10-CM | POA: Diagnosis not present

## 2023-03-15 DIAGNOSIS — I350 Nonrheumatic aortic (valve) stenosis: Secondary | ICD-10-CM | POA: Diagnosis not present

## 2023-03-15 DIAGNOSIS — I4891 Unspecified atrial fibrillation: Secondary | ICD-10-CM | POA: Diagnosis not present

## 2023-03-16 ENCOUNTER — Other Ambulatory Visit: Payer: Self-pay | Admitting: Medical Oncology

## 2023-03-16 ENCOUNTER — Inpatient Hospital Stay: Payer: 59 | Attending: Internal Medicine

## 2023-03-16 DIAGNOSIS — Z452 Encounter for adjustment and management of vascular access device: Secondary | ICD-10-CM | POA: Insufficient documentation

## 2023-03-16 DIAGNOSIS — Z95828 Presence of other vascular implants and grafts: Secondary | ICD-10-CM

## 2023-03-16 DIAGNOSIS — Z87891 Personal history of nicotine dependence: Secondary | ICD-10-CM | POA: Diagnosis not present

## 2023-03-16 DIAGNOSIS — C3432 Malignant neoplasm of lower lobe, left bronchus or lung: Secondary | ICD-10-CM

## 2023-03-16 MED ORDER — SODIUM CHLORIDE 0.9% FLUSH
10.0000 mL | Freq: Once | INTRAVENOUS | Status: AC
  Administered 2023-03-16: 10 mL

## 2023-03-16 MED ORDER — LIDOCAINE-PRILOCAINE 2.5-2.5 % EX CREA: 1.0000 | TOPICAL_CREAM | CUTANEOUS | 2 refills | Status: DC | PRN

## 2023-03-16 MED ORDER — HEPARIN SOD (PORK) LOCK FLUSH 100 UNIT/ML IV SOLN
500.0000 [IU] | Freq: Once | INTRAVENOUS | Status: AC
  Administered 2023-03-16: 500 [IU]

## 2023-03-17 ENCOUNTER — Ambulatory Visit: Payer: 59

## 2023-03-17 VITALS — Wt 87.0 lb

## 2023-03-17 DIAGNOSIS — Z0000 Encounter for general adult medical examination without abnormal findings: Secondary | ICD-10-CM

## 2023-03-17 NOTE — Patient Instructions (Addendum)
 Sheena [name] , Thank you for taking time to come for your Medicare Wellness Visit. I appreciate your ongoing commitment to your health goals. Please review the following plan we discussed and let me know if I can assist you in the future.   Referrals/Orders/Follow-Ups/Clinician Recommendations: maintain health  Each day, aim for 6 glasses of water, plenty of protein in your diet and try to get up and walk/ stretch every hour for 5-10 minutes at a time.    This is a list of the screening recommended for you and due dates:  Health Maintenance  Topic Date Due   Flu Shot  Never done   Pneumonia Vaccine (1 of 2 - PCV) Never done   Zoster (Shingles) Vaccine (1 of 2) Never done   COVID-19 Vaccine (5 - 2024-25 season) 11/01/2022   Medicare Annual Wellness Visit  03/16/2024   DTaP/Tdap/Td vaccine (2 - Td or Tdap) 01/13/2032   DEXA scan (bone density measurement)  Completed   Hepatitis C Screening  Completed   HPV Vaccine  Aged Out   Screening for Lung Cancer  Discontinued   Colon Cancer Screening  Discontinued    Advanced directives: (Copy Requested) Please bring a copy of your health care power of attorney and living will to the office to be added to your chart at your convenience.  Next Medicare Annual Wellness Visit scheduled for next year: Yes This patient declined Interactive audio and video telecommunications. Therefore the visit was completed with audio only.
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
## 2023-03-18 ENCOUNTER — Other Ambulatory Visit: Payer: Self-pay

## 2023-03-23 DIAGNOSIS — N390 Urinary tract infection, site not specified: Secondary | ICD-10-CM | POA: Diagnosis not present

## 2023-03-23 DIAGNOSIS — Z87891 Personal history of nicotine dependence: Secondary | ICD-10-CM | POA: Diagnosis not present

## 2023-03-23 DIAGNOSIS — Z9981 Dependence on supplemental oxygen: Secondary | ICD-10-CM | POA: Diagnosis not present

## 2023-03-23 DIAGNOSIS — J449 Chronic obstructive pulmonary disease, unspecified: Secondary | ICD-10-CM | POA: Diagnosis not present

## 2023-03-23 DIAGNOSIS — Z79891 Long term (current) use of opiate analgesic: Secondary | ICD-10-CM | POA: Diagnosis not present

## 2023-03-23 DIAGNOSIS — Z87440 Personal history of urinary (tract) infections: Secondary | ICD-10-CM | POA: Diagnosis not present

## 2023-03-23 DIAGNOSIS — K219 Gastro-esophageal reflux disease without esophagitis: Secondary | ICD-10-CM | POA: Diagnosis not present

## 2023-03-23 DIAGNOSIS — E785 Hyperlipidemia, unspecified: Secondary | ICD-10-CM | POA: Diagnosis not present

## 2023-03-23 DIAGNOSIS — I10 Essential (primary) hypertension: Secondary | ICD-10-CM | POA: Diagnosis not present

## 2023-03-23 DIAGNOSIS — C3432 Malignant neoplasm of lower lobe, left bronchus or lung: Secondary | ICD-10-CM | POA: Diagnosis not present

## 2023-03-23 DIAGNOSIS — D630 Anemia in neoplastic disease: Secondary | ICD-10-CM | POA: Diagnosis not present

## 2023-03-23 DIAGNOSIS — I350 Nonrheumatic aortic (valve) stenosis: Secondary | ICD-10-CM | POA: Diagnosis not present

## 2023-03-23 DIAGNOSIS — I4891 Unspecified atrial fibrillation: Secondary | ICD-10-CM | POA: Diagnosis not present

## 2023-03-23 DIAGNOSIS — M8088XD Other osteoporosis with current pathological fracture, vertebra(e), subsequent encounter for fracture with routine healing: Secondary | ICD-10-CM | POA: Diagnosis not present

## 2023-03-23 DIAGNOSIS — G8929 Other chronic pain: Secondary | ICD-10-CM | POA: Diagnosis not present

## 2023-03-23 DIAGNOSIS — J9601 Acute respiratory failure with hypoxia: Secondary | ICD-10-CM | POA: Diagnosis not present

## 2023-03-24 DIAGNOSIS — J449 Chronic obstructive pulmonary disease, unspecified: Secondary | ICD-10-CM | POA: Diagnosis not present

## 2023-03-24 DIAGNOSIS — C3490 Malignant neoplasm of unspecified part of unspecified bronchus or lung: Secondary | ICD-10-CM | POA: Diagnosis not present

## 2023-03-24 DIAGNOSIS — J439 Emphysema, unspecified: Secondary | ICD-10-CM | POA: Diagnosis not present

## 2023-03-24 DIAGNOSIS — S32009D Unspecified fracture of unspecified lumbar vertebra, subsequent encounter for fracture with routine healing: Secondary | ICD-10-CM | POA: Diagnosis not present

## 2023-03-24 DIAGNOSIS — J9601 Acute respiratory failure with hypoxia: Secondary | ICD-10-CM | POA: Diagnosis not present

## 2023-03-31 ENCOUNTER — Emergency Department (HOSPITAL_COMMUNITY): Payer: 59

## 2023-03-31 ENCOUNTER — Other Ambulatory Visit: Payer: Self-pay

## 2023-03-31 ENCOUNTER — Inpatient Hospital Stay (HOSPITAL_COMMUNITY)
Admission: EM | Admit: 2023-03-31 | Discharge: 2023-04-06 | DRG: 956 | Disposition: A | Discharge status: Skilled Nursing Facility | Payer: 59 | Attending: Family Medicine | Admitting: Family Medicine

## 2023-03-31 ENCOUNTER — Inpatient Hospital Stay (HOSPITAL_COMMUNITY): Payer: 59 | Admitting: Anesthesiology

## 2023-03-31 DIAGNOSIS — Z8249 Family history of ischemic heart disease and other diseases of the circulatory system: Secondary | ICD-10-CM

## 2023-03-31 DIAGNOSIS — I480 Paroxysmal atrial fibrillation: Secondary | ICD-10-CM | POA: Diagnosis present

## 2023-03-31 DIAGNOSIS — E538 Deficiency of other specified B group vitamins: Secondary | ICD-10-CM | POA: Diagnosis present

## 2023-03-31 DIAGNOSIS — W010XXA Fall on same level from slipping, tripping and stumbling without subsequent striking against object, initial encounter: Secondary | ICD-10-CM | POA: Diagnosis present

## 2023-03-31 DIAGNOSIS — S0181XA Laceration without foreign body of other part of head, initial encounter: Secondary | ICD-10-CM | POA: Diagnosis present

## 2023-03-31 DIAGNOSIS — R58 Hemorrhage, not elsewhere classified: Secondary | ICD-10-CM | POA: Diagnosis not present

## 2023-03-31 DIAGNOSIS — D62 Acute posthemorrhagic anemia: Secondary | ICD-10-CM | POA: Diagnosis not present

## 2023-03-31 DIAGNOSIS — Z825 Family history of asthma and other chronic lower respiratory diseases: Secondary | ICD-10-CM

## 2023-03-31 DIAGNOSIS — M85861 Other specified disorders of bone density and structure, right lower leg: Secondary | ICD-10-CM | POA: Diagnosis not present

## 2023-03-31 DIAGNOSIS — Z66 Do not resuscitate: Secondary | ICD-10-CM | POA: Diagnosis not present

## 2023-03-31 DIAGNOSIS — R636 Underweight: Secondary | ICD-10-CM | POA: Diagnosis not present

## 2023-03-31 DIAGNOSIS — S72001A Fracture of unspecified part of neck of right femur, initial encounter for closed fracture: Secondary | ICD-10-CM | POA: Diagnosis not present

## 2023-03-31 DIAGNOSIS — Z95828 Presence of other vascular implants and grafts: Secondary | ICD-10-CM

## 2023-03-31 DIAGNOSIS — C3490 Malignant neoplasm of unspecified part of unspecified bronchus or lung: Secondary | ICD-10-CM | POA: Diagnosis not present

## 2023-03-31 DIAGNOSIS — E785 Hyperlipidemia, unspecified: Secondary | ICD-10-CM | POA: Diagnosis present

## 2023-03-31 DIAGNOSIS — I10 Essential (primary) hypertension: Secondary | ICD-10-CM | POA: Diagnosis present

## 2023-03-31 DIAGNOSIS — Y92039 Unspecified place in apartment as the place of occurrence of the external cause: Secondary | ICD-10-CM | POA: Diagnosis not present

## 2023-03-31 DIAGNOSIS — Z87891 Personal history of nicotine dependence: Secondary | ICD-10-CM | POA: Diagnosis not present

## 2023-03-31 DIAGNOSIS — N179 Acute kidney failure, unspecified: Secondary | ICD-10-CM

## 2023-03-31 DIAGNOSIS — D539 Nutritional anemia, unspecified: Secondary | ICD-10-CM | POA: Diagnosis not present

## 2023-03-31 DIAGNOSIS — R0902 Hypoxemia: Secondary | ICD-10-CM | POA: Diagnosis not present

## 2023-03-31 DIAGNOSIS — G319 Degenerative disease of nervous system, unspecified: Secondary | ICD-10-CM | POA: Diagnosis not present

## 2023-03-31 DIAGNOSIS — Z452 Encounter for adjustment and management of vascular access device: Secondary | ICD-10-CM | POA: Diagnosis not present

## 2023-03-31 DIAGNOSIS — W19XXXA Unspecified fall, initial encounter: Principal | ICD-10-CM

## 2023-03-31 DIAGNOSIS — M6259 Muscle wasting and atrophy, not elsewhere classified, multiple sites: Secondary | ICD-10-CM | POA: Diagnosis not present

## 2023-03-31 DIAGNOSIS — Z79899 Other long term (current) drug therapy: Secondary | ICD-10-CM

## 2023-03-31 DIAGNOSIS — Z803 Family history of malignant neoplasm of breast: Secondary | ICD-10-CM

## 2023-03-31 DIAGNOSIS — Z808 Family history of malignant neoplasm of other organs or systems: Secondary | ICD-10-CM

## 2023-03-31 DIAGNOSIS — Z741 Need for assistance with personal care: Secondary | ICD-10-CM | POA: Diagnosis not present

## 2023-03-31 DIAGNOSIS — I6782 Cerebral ischemia: Secondary | ICD-10-CM | POA: Diagnosis not present

## 2023-03-31 DIAGNOSIS — M79671 Pain in right foot: Secondary | ICD-10-CM | POA: Diagnosis present

## 2023-03-31 DIAGNOSIS — Z743 Need for continuous supervision: Secondary | ICD-10-CM | POA: Diagnosis not present

## 2023-03-31 DIAGNOSIS — Z681 Body mass index (BMI) 19 or less, adult: Secondary | ICD-10-CM

## 2023-03-31 DIAGNOSIS — C3432 Malignant neoplasm of lower lobe, left bronchus or lung: Secondary | ICD-10-CM | POA: Diagnosis not present

## 2023-03-31 DIAGNOSIS — G8929 Other chronic pain: Secondary | ICD-10-CM | POA: Diagnosis present

## 2023-03-31 DIAGNOSIS — S72141A Displaced intertrochanteric fracture of right femur, initial encounter for closed fracture: Principal | ICD-10-CM | POA: Diagnosis present

## 2023-03-31 DIAGNOSIS — E8721 Acute metabolic acidosis: Secondary | ICD-10-CM | POA: Diagnosis not present

## 2023-03-31 DIAGNOSIS — E875 Hyperkalemia: Secondary | ICD-10-CM | POA: Diagnosis not present

## 2023-03-31 DIAGNOSIS — S199XXA Unspecified injury of neck, initial encounter: Secondary | ICD-10-CM | POA: Diagnosis not present

## 2023-03-31 DIAGNOSIS — R2689 Other abnormalities of gait and mobility: Secondary | ICD-10-CM | POA: Diagnosis not present

## 2023-03-31 DIAGNOSIS — J439 Emphysema, unspecified: Secondary | ICD-10-CM | POA: Diagnosis not present

## 2023-03-31 DIAGNOSIS — M545 Low back pain, unspecified: Secondary | ICD-10-CM | POA: Diagnosis not present

## 2023-03-31 DIAGNOSIS — E860 Dehydration: Secondary | ICD-10-CM | POA: Diagnosis present

## 2023-03-31 DIAGNOSIS — T796XXA Traumatic ischemia of muscle, initial encounter: Secondary | ICD-10-CM

## 2023-03-31 DIAGNOSIS — S72141D Displaced intertrochanteric fracture of right femur, subsequent encounter for closed fracture with routine healing: Secondary | ICD-10-CM | POA: Diagnosis not present

## 2023-03-31 DIAGNOSIS — Z886 Allergy status to analgesic agent status: Secondary | ICD-10-CM | POA: Diagnosis not present

## 2023-03-31 DIAGNOSIS — Z9889 Other specified postprocedural states: Secondary | ICD-10-CM | POA: Diagnosis not present

## 2023-03-31 DIAGNOSIS — Z7982 Long term (current) use of aspirin: Secondary | ICD-10-CM

## 2023-03-31 DIAGNOSIS — M8580 Other specified disorders of bone density and structure, unspecified site: Secondary | ICD-10-CM | POA: Diagnosis not present

## 2023-03-31 DIAGNOSIS — M810 Age-related osteoporosis without current pathological fracture: Secondary | ICD-10-CM | POA: Diagnosis not present

## 2023-03-31 DIAGNOSIS — I700 Atherosclerosis of aorta: Secondary | ICD-10-CM | POA: Diagnosis not present

## 2023-03-31 DIAGNOSIS — S0990XA Unspecified injury of head, initial encounter: Secondary | ICD-10-CM | POA: Diagnosis not present

## 2023-03-31 DIAGNOSIS — G8918 Other acute postprocedural pain: Secondary | ICD-10-CM | POA: Diagnosis not present

## 2023-03-31 DIAGNOSIS — I4891 Unspecified atrial fibrillation: Secondary | ICD-10-CM | POA: Diagnosis not present

## 2023-03-31 DIAGNOSIS — Z888 Allergy status to other drugs, medicaments and biological substances status: Secondary | ICD-10-CM

## 2023-03-31 DIAGNOSIS — R6889 Other general symptoms and signs: Secondary | ICD-10-CM | POA: Diagnosis not present

## 2023-03-31 DIAGNOSIS — M6281 Muscle weakness (generalized): Secondary | ICD-10-CM | POA: Diagnosis not present

## 2023-03-31 DIAGNOSIS — I499 Cardiac arrhythmia, unspecified: Secondary | ICD-10-CM | POA: Diagnosis not present

## 2023-03-31 LAB — COMPREHENSIVE METABOLIC PANEL
ALT: 18 U/L (ref 0–44)
AST: 42 U/L — ABNORMAL HIGH (ref 15–41)
Albumin: 3.9 g/dL (ref 3.5–5.0)
Alkaline Phosphatase: 52 U/L (ref 38–126)
Anion gap: 18 — ABNORMAL HIGH (ref 5–15)
BUN: 36 mg/dL — ABNORMAL HIGH (ref 8–23)
CO2: 20 mmol/L — ABNORMAL LOW (ref 22–32)
Calcium: 9.8 mg/dL (ref 8.9–10.3)
Chloride: 101 mmol/L (ref 98–111)
Creatinine, Ser: 1.49 mg/dL — ABNORMAL HIGH (ref 0.44–1.00)
GFR, Estimated: 36 mL/min — ABNORMAL LOW (ref >60)
Glucose, Bld: 177 mg/dL — ABNORMAL HIGH (ref 70–99)
Potassium: 4.7 mmol/L (ref 3.5–5.1)
Sodium: 139 mmol/L (ref 135–145)
Total Bilirubin: 0.5 mg/dL (ref 0.0–1.2)
Total Protein: 7.3 g/dL (ref 6.5–8.1)

## 2023-03-31 LAB — URINALYSIS, ROUTINE W REFLEX MICROSCOPIC
Bilirubin Urine: NEGATIVE
Glucose, UA: NEGATIVE mg/dL
Ketones, ur: NEGATIVE mg/dL
Leukocytes,Ua: NEGATIVE
Nitrite: NEGATIVE
Protein, ur: 30 mg/dL — AB
Specific Gravity, Urine: 1.025 (ref 1.005–1.030)
pH: 5 (ref 5.0–8.0)

## 2023-03-31 LAB — I-STAT CHEM 8, ED
BUN: 48 mg/dL — ABNORMAL HIGH (ref 8–23)
Calcium, Ion: 1.12 mmol/L — ABNORMAL LOW (ref 1.15–1.40)
Chloride: 103 mmol/L (ref 98–111)
Creatinine, Ser: 1.3 mg/dL — ABNORMAL HIGH (ref 0.44–1.00)
Glucose, Bld: 170 mg/dL — ABNORMAL HIGH (ref 70–99)
HCT: 31 % — ABNORMAL LOW (ref 36.0–46.0)
Hemoglobin: 10.5 g/dL — ABNORMAL LOW (ref 12.0–15.0)
Potassium: 4.8 mmol/L (ref 3.5–5.1)
Sodium: 138 mmol/L (ref 135–145)
TCO2: 24 mmol/L (ref 22–32)

## 2023-03-31 LAB — CBC
HCT: 29.1 % — ABNORMAL LOW (ref 36.0–46.0)
Hemoglobin: 9.3 g/dL — ABNORMAL LOW (ref 12.0–15.0)
MCH: 34.1 pg — ABNORMAL HIGH (ref 26.0–34.0)
MCHC: 32 g/dL (ref 30.0–36.0)
MCV: 106.6 fL — ABNORMAL HIGH (ref 80.0–100.0)
Platelets: 188 10*3/uL (ref 150–400)
RBC: 2.73 MIL/uL — ABNORMAL LOW (ref 3.87–5.11)
RDW: 14.3 % (ref 11.5–15.5)
WBC: 15.5 10*3/uL — ABNORMAL HIGH (ref 4.0–10.5)
nRBC: 0 % (ref 0.0–0.2)

## 2023-03-31 LAB — PROTIME-INR
INR: 1 (ref 0.8–1.2)
Prothrombin Time: 13.7 s (ref 11.4–15.2)

## 2023-03-31 LAB — CK TOTAL AND CKMB (NOT AT ARMC)
CK, MB: 13.5 ng/mL — ABNORMAL HIGH (ref 0.5–5.0)
Total CK: 742 U/L — ABNORMAL HIGH (ref 38–234)

## 2023-03-31 LAB — SAMPLE TO BLOOD BANK

## 2023-03-31 LAB — I-STAT CG4 LACTIC ACID, ED: Lactic Acid, Venous: 5.4 mmol/L (ref 0.5–1.9)

## 2023-03-31 MED ORDER — OXYCODONE HCL 5 MG PO TABS
10.0000 mg | ORAL_TABLET | ORAL | Status: DC | PRN
  Filled 2023-03-31: qty 2

## 2023-03-31 MED ORDER — SODIUM CHLORIDE 0.9 % IV SOLN: INTRAVENOUS | Status: AC

## 2023-03-31 MED ORDER — HYDROMORPHONE HCL 1 MG/ML IJ SOLN
0.5000 mg | INTRAMUSCULAR | Status: DC | PRN
  Administered 2023-04-01 – 2023-04-02 (×4): 0.5 mg via INTRAVENOUS
  Filled 2023-03-31 (×2): qty 0.5
  Filled 2023-03-31: qty 1
  Filled 2023-03-31: qty 0.5

## 2023-03-31 MED ORDER — OXYCODONE HCL 5 MG PO TABS
5.0000 mg | ORAL_TABLET | ORAL | Status: DC | PRN
  Administered 2023-04-01 (×2): 5 mg via ORAL
  Filled 2023-03-31 (×3): qty 1

## 2023-03-31 MED ORDER — FOLIC ACID 1 MG PO TABS
1.0000 mg | ORAL_TABLET | Freq: Every day | ORAL | Status: DC
  Administered 2023-03-31 – 2023-04-06 (×7): 1 mg via ORAL
  Filled 2023-03-31 (×7): qty 1

## 2023-03-31 MED ORDER — ONDANSETRON HCL 4 MG/2ML IJ SOLN: 4.0000 mg | Freq: Once | INTRAMUSCULAR | Status: AC

## 2023-03-31 MED ORDER — LIDOCAINE HCL (CARDIAC) PF 100 MG/5ML IV SOSY
PREFILLED_SYRINGE | INTRAVENOUS | Status: AC
  Filled 2023-03-31: qty 5

## 2023-03-31 MED ORDER — FENTANYL CITRATE (PF) 100 MCG/2ML IJ SOLN
INTRAMUSCULAR | Status: AC
  Administered 2023-03-31: 50 ug
  Filled 2023-03-31: qty 2

## 2023-03-31 MED ORDER — HEPARIN SODIUM (PORCINE) 5000 UNIT/ML IJ SOLN: 5000.0000 [IU] | Freq: Three times a day (TID) | INTRAMUSCULAR | Status: DC

## 2023-03-31 MED ORDER — AMIODARONE HCL 200 MG PO TABS
200.0000 mg | ORAL_TABLET | Freq: Every day | ORAL | Status: DC
  Administered 2023-04-01 – 2023-04-06 (×6): 200 mg via ORAL
  Filled 2023-03-31 (×6): qty 1

## 2023-03-31 MED ORDER — METOPROLOL TARTRATE 12.5 MG HALF TABLET
12.5000 mg | ORAL_TABLET | Freq: Two times a day (BID) | ORAL | Status: DC
Start: 2023-04-01 — End: 2023-04-06
  Administered 2023-04-01 – 2023-04-06 (×9): 12.5 mg via ORAL
  Filled 2023-03-31 (×11): qty 1

## 2023-03-31 MED ORDER — FENTANYL CITRATE PF 50 MCG/ML IJ SOSY: 12.5000 ug | PREFILLED_SYRINGE | Freq: Once | INTRAMUSCULAR | Status: DC

## 2023-03-31 MED ORDER — DEXAMETHASONE SODIUM PHOSPHATE 4 MG/ML IJ SOLN
INTRAMUSCULAR | Status: DC | PRN
  Administered 2023-03-31: 10 mg via PERINEURAL

## 2023-03-31 MED ORDER — FENTANYL CITRATE PF 50 MCG/ML IJ SOSY
50.0000 ug | PREFILLED_SYRINGE | Freq: Once | INTRAMUSCULAR | Status: AC
  Administered 2023-03-31: 50 ug via INTRAVENOUS
  Filled 2023-03-31: qty 1

## 2023-03-31 MED ORDER — ONDANSETRON HCL 4 MG/2ML IJ SOLN
INTRAMUSCULAR | Status: AC
  Administered 2023-03-31: 4 mg via INTRAVENOUS
  Filled 2023-03-31: qty 2

## 2023-03-31 MED ORDER — ACETAMINOPHEN 325 MG PO TABS
650.0000 mg | ORAL_TABLET | Freq: Four times a day (QID) | ORAL | Status: DC | PRN
  Filled 2023-03-31: qty 2

## 2023-03-31 MED ORDER — AMLODIPINE BESYLATE 5 MG PO TABS
5.0000 mg | ORAL_TABLET | Freq: Every day | ORAL | Status: DC
  Administered 2023-04-01 – 2023-04-04 (×4): 5 mg via ORAL
  Filled 2023-03-31 (×4): qty 1

## 2023-03-31 MED ORDER — METHOCARBAMOL 1000 MG/10ML IJ SOLN
500.0000 mg | Freq: Four times a day (QID) | INTRAMUSCULAR | Status: DC | PRN
  Administered 2023-04-04: 500 mg via INTRAVENOUS
  Filled 2023-03-31: qty 10

## 2023-03-31 MED ORDER — ALBUMIN HUMAN 5 % IV SOLN
INTRAVENOUS | Status: AC
  Filled 2023-03-31: qty 250

## 2023-03-31 MED ORDER — ROPIVACAINE HCL 5 MG/ML IJ SOLN
INTRAMUSCULAR | Status: DC | PRN
  Administered 2023-03-31: 20 mL via PERINEURAL

## 2023-03-31 MED ORDER — CLONIDINE HCL (ANALGESIA) 100 MCG/ML EP SOLN
EPIDURAL | Status: DC | PRN
  Administered 2023-03-31: 75 ug

## 2023-03-31 MED ORDER — ALBUMIN HUMAN 5 % IV SOLN
12.5000 g | Freq: Once | INTRAVENOUS | Status: AC
  Administered 2023-03-31: 12.5 g via INTRAVENOUS

## 2023-03-31 MED ORDER — MELATONIN 5 MG PO TABS
10.0000 mg | ORAL_TABLET | Freq: Every evening | ORAL | Status: DC | PRN
  Administered 2023-04-03 – 2023-04-05 (×3): 10 mg via ORAL
  Filled 2023-03-31 (×3): qty 2

## 2023-03-31 MED ORDER — PROCHLORPERAZINE EDISYLATE 10 MG/2ML IJ SOLN
10.0000 mg | Freq: Four times a day (QID) | INTRAMUSCULAR | Status: DC | PRN
  Administered 2023-04-01 – 2023-04-06 (×3): 10 mg via INTRAVENOUS
  Filled 2023-03-31 (×3): qty 2

## 2023-03-31 MED ORDER — METHOCARBAMOL 500 MG PO TABS
500.0000 mg | ORAL_TABLET | Freq: Four times a day (QID) | ORAL | Status: DC | PRN
  Administered 2023-04-03 – 2023-04-06 (×8): 500 mg via ORAL
  Filled 2023-03-31 (×8): qty 1

## 2023-03-31 MED ORDER — SODIUM CHLORIDE 0.9 % IV BOLUS
500.0000 mL | Freq: Once | INTRAVENOUS | Status: AC
Start: 2023-03-31 — End: 2023-03-31
  Administered 2023-03-31: 500 mL via INTRAVENOUS

## 2023-03-31 NOTE — ED Notes (Signed)
Pt to ED incontinent of stool. Pt cleaned up and provided clean linen

## 2023-03-31 NOTE — ED Provider Notes (Signed)
[facility] AT [facility] Provider Note   CSN: [id number] Arrival date & time: 03/31/23  1214     History  Chief Complaint  Patient presents with   Fall   Hip Pain   Leg Pain    Sheena [name] [facility] is a 78 y.o. female with history of anxiety, heart murmur, hypertension, small cell carcinoma of left lower lobe of lung, osteoporosis, chronic low back pain, who presents the emergency department after a fall.  Patient tripped and fell last night at home, around 2000.  She states that she been on the floor since, EMS brought her in at 1200 today.  She did strike her head on the ground, but did not lose consciousness.  He sustained a laceration to her right forehead, and is complaining of pain in her right hip.  Right leg appears shortened.  Arrives in c-collar.  Not on blood thinners.   Fall  Hip Pain  Leg Pain      Home Medications Prior to Admission medications   Medication Sig Start Date End Date Taking? Authorizing Provider  acetaminophen (TYLENOL) 500 MG tablet Take 1,000 mg by mouth 2 (two) times daily as needed for mild pain.    [provider]  amiodarone (PACERONE) 200 MG tablet Take 1 tablet (200 mg total) by mouth daily. 10/14/22   Duke, Roe Rutherford, PA  amLODipine (NORVASC) 5 MG tablet Take 5 mg by mouth daily. 01/02/23   [provider]  atorvastatin (LIPITOR) 20 MG tablet TAKE 1 TABLET BY MOUTH EVERY DAY Patient taking differently: Take 20 mg by mouth daily. 05/25/22   [name], MD  Calcium Citrate-Vitamin D (CALCIUM CITRATE PETITE/VIT D PO) Take 1 tablet by mouth 2 (two) times daily.    [provider]  calcium-vitamin D (OYSTER CALCIUM 500 + D) 500-200 MG-UNIT tablet Take 1 tablet by mouth 2 (two) times daily. 01/22/23   [provider]  famotidine (PEPCID) 20 MG tablet Take 20 mg by mouth daily as needed for heartburn.    [provider]  lidocaine-prilocaine (EMLA) cream Apply 1  Application topically as needed. 03/16/23   Si Gaul, MD  Magnesium Citrate 125 MG CAPS Take 2 capsules by mouth 2 (two) times daily. Patient taking differently: Take 125 mg by mouth 2 (two) times daily. 10/14/22   Duke, Roe Rutherford, PA  magnesium oxide (MAG-OX) 400 MG tablet Take 1 tablet by mouth daily. 01/22/23   [provider]  metoprolol tartrate (LOPRESSOR) 25 MG tablet Take 12.5 mg by mouth 2 (two) times daily.    [provider]  mirtazapine (REMERON) 7.5 MG tablet Take 1 tablet (7.5 mg total) by mouth at bedtime. 12/21/22   Wouk, Wilfred Curtis, MD  polyethylene glycol (MIRALAX / GLYCOLAX) 17 g packet Take 17 g by mouth daily as needed for mild constipation. 12/21/22   Wouk, Wilfred Curtis, MD  PRESCRIPTION MEDICATION Apply 1 application  topically in the morning and at bedtime. Unknown cream given to Pt from the cancer center for after radiation    [provider]  prochlorperazine (COMPAZINE) 10 MG tablet Take 1 tablet (10 mg total) by mouth every 6 (six) hours as needed for nausea or vomiting. 08/04/22   Heilingoetter, Cassandra L, PA-C  sucralfate (CARAFATE) 1 GM/10ML suspension Take 10 mLs (1 g total) by mouth 4 (four) times daily -  with meals and at bedtime. 08/26/22   Ronny Bacon, PA-C  traMADol (ULTRAM) 50 MG tablet Take  1 tablet (50 mg total) by mouth 2 (two) times daily as needed. 02/03/23   Jeani Sow, MD      Allergies    Antihistamines, diphenhydramine-type; Aspirin; and Evista [raloxifene]    Review of Systems   Review of Systems  Musculoskeletal:  Positive for arthralgias.  Skin:  Positive for wound.  All other systems reviewed and are negative.   Physical Exam Updated Vital Signs BP 137/60   Pulse 74   Temp 97.6 F (36.4 C) (Oral)   Resp 13   Ht 5' (1.524 m)   Wt 39.5 kg   SpO2 96%   BMI 16.99 kg/m  Physical Exam Vitals and nursing note reviewed.  Constitutional:      Appearance: Normal appearance.  HENT:      Head: Normocephalic.     Comments: Laceration with dried blood to the right forehead Eyes:     Conjunctiva/sclera: Conjunctivae normal.  Neck:     Comments: In c-collar Cardiovascular:     Rate and Rhythm: Normal rate and regular rhythm.  Pulmonary:     Effort: Pulmonary effort is normal. No respiratory distress.     Breath sounds: Normal breath sounds.  Chest:     Comments: Chest wall stable Abdominal:     General: There is no distension.     Palpations: Abdomen is soft.     Tenderness: There is no abdominal tenderness.  Musculoskeletal:     Comments: Right hip pain to palpation of the pelvis, contusion to the left hip and the right lateral leg.  Right leg appears shortened.  Normal sensation.  Strong distal pulses.  Skin:    General: Skin is warm and dry.  Neurological:     General: No focal deficit present.     Mental Status: She is alert.     ED Results / Procedures / Treatments   Labs (all labs ordered are listed, but only abnormal results are displayed) Labs Reviewed  CBC - Abnormal; Notable for the following components:      Result Value   WBC 15.5 (*)    RBC 2.73 (*)    Hemoglobin 9.3 (*)    HCT 29.1 (*)    MCV 106.6 (*)    MCH 34.1 (*)    All other components within normal limits  COMPREHENSIVE METABOLIC PANEL - Abnormal; Notable for the following components:   CO2 20 (*)    Glucose, Bld 177 (*)    BUN 36 (*)    Creatinine, Ser 1.49 (*)    AST 42 (*)    GFR, Estimated 36 (*)    Anion gap 18 (*)    All other components within normal limits  CK TOTAL AND CKMB (NOT AT [facility]) - Abnormal; Notable for the following components:   Total CK 742 (*)    CK, MB 13.5 (*)    All other components within normal limits  I-STAT CHEM 8, ED - Abnormal; Notable for the following components:   BUN 48 (*)    Creatinine, Ser 1.30 (*)    Glucose, Bld 170 (*)    Calcium, Ion 1.12 (*)    Hemoglobin 10.5 (*)    HCT 31.0 (*)    All other components within normal limits   I-STAT CG4 LACTIC ACID, ED - Abnormal; Notable for the following components:   Lactic Acid, Venous 5.4 (*)    All other components within normal limits  PROTIME-INR  ETHANOL  URINALYSIS, ROUTINE W REFLEX MICROSCOPIC  SAMPLE TO  BLOOD BANK    EKG None  Radiology CT Head Wo Contrast Result Date: 03/31/2023 CLINICAL DATA:  Head trauma, moderate-severe; Neck trauma (Age >= 65y). Fall last night. Forehead laceration. EXAM: CT HEAD WITHOUT CONTRAST CT CERVICAL SPINE WITHOUT CONTRAST TECHNIQUE: Multidetector CT imaging of the head and cervical spine was performed following the standard protocol without intravenous contrast. Multiplanar CT image reconstructions of the cervical spine were also generated. RADIATION DOSE REDUCTION: This exam was performed according to the departmental dose-optimization program which includes automated exposure control, adjustment of the mA and/or kV according to patient size and/or use of iterative reconstruction technique. COMPARISON:  Head MRI 01/30/2023 FINDINGS: CT HEAD FINDINGS Brain: There is no evidence of an acute infarct, intracranial hemorrhage, mass, midline shift, or extra-axial fluid collection. There is mild generalized cerebral atrophy. Cerebral white matter hypodensities are nonspecific but compatible with mild chronic small vessel ischemic disease. Vascular: Calcified atherosclerosis at the skull base. No hyperdense vessel. Skull: No acute fracture or destructive lesion. Sinuses/Orbits: The included paranasal sinuses and mastoid air cells are largely clear. Unremarkable orbits. Other: None. CT CERVICAL SPINE FINDINGS Alignment: Cervical spine straightening. Trace anterolisthesis of C7 on T1, likely degenerative. Skull base and vertebrae: No acute fracture or suspicious osseous lesion. Moderate atlantodental degenerative changes. Soft tissues and spinal canal: No prevertebral fluid or swelling. No visible canal hematoma. Disc levels: Very mild disc degeneration  for age. Up to moderate multilevel facet arthrosis. Upper chest: Emphysema. Other: Calcified atherosclerosis about the left greater than right carotid bifurcations. Partially visualized right internal jugular venous catheter. IMPRESSION: 1. No evidence of acute intracranial abnormality or cervical spine fracture. 2. Mild chronic small vessel ischemic disease. Electronically Signed   By: Sebastian Ache M.D.   On: 03/31/2023 14:34   CT Cervical Spine Wo Contrast Result Date: 03/31/2023 CLINICAL DATA:  Head trauma, moderate-severe; Neck trauma (Age >= 65y). Fall last night. Forehead laceration. EXAM: CT HEAD WITHOUT CONTRAST CT CERVICAL SPINE WITHOUT CONTRAST TECHNIQUE: Multidetector CT imaging of the head and cervical spine was performed following the standard protocol without intravenous contrast. Multiplanar CT image reconstructions of the cervical spine were also generated. RADIATION DOSE REDUCTION: This exam was performed according to the departmental dose-optimization program which includes automated exposure control, adjustment of the mA and/or kV according to patient size and/or use of iterative reconstruction technique. COMPARISON:  Head MRI 01/30/2023 FINDINGS: CT HEAD FINDINGS Brain: There is no evidence of an acute infarct, intracranial hemorrhage, mass, midline shift, or extra-axial fluid collection. There is mild generalized cerebral atrophy. Cerebral white matter hypodensities are nonspecific but compatible with mild chronic small vessel ischemic disease. Vascular: Calcified atherosclerosis at the skull base. No hyperdense vessel. Skull: No acute fracture or destructive lesion. Sinuses/Orbits: The included paranasal sinuses and mastoid air cells are largely clear. Unremarkable orbits. Other: None. CT CERVICAL SPINE FINDINGS Alignment: Cervical spine straightening. Trace anterolisthesis of C7 on T1, likely degenerative. Skull base and vertebrae: No acute fracture or suspicious osseous lesion. Moderate  atlantodental degenerative changes. Soft tissues and spinal canal: No prevertebral fluid or swelling. No visible canal hematoma. Disc levels: Very mild disc degeneration for age. Up to moderate multilevel facet arthrosis. Upper chest: Emphysema. Other: Calcified atherosclerosis about the left greater than right carotid bifurcations. Partially visualized right internal jugular venous catheter. IMPRESSION: 1. No evidence of acute intracranial abnormality or cervical spine fracture. 2. Mild chronic small vessel ischemic disease. Electronically Signed   By: Sebastian Ache M.D.   On: 03/31/2023 14:34   DG Pelvis  Portable Result Date: 03/31/2023 CLINICAL DATA:  Fall, right hip pain EXAM: PORTABLE PELVIS 1-2 VIEWS COMPARISON:  12/09/2022 FINDINGS: Acute, comminuted intertrochanteric fracture of the proximal right femur with approximately 90 degrees of varus angulation. Hip joint intact without dislocation. Bony pelvis intact without fracture or diastasis. Soft tissue swelling is evident the lateral aspect of the right hip. IMPRESSION: Acute, comminuted intertrochanteric fracture of the proximal right femur with varus angulation. Electronically Signed   By: Duanne Guess D.O.   On: 03/31/2023 14:23   DG Chest Portable 1 View Result Date: 03/31/2023 CLINICAL DATA:  Fall and right hip fracture. EXAM: PORTABLE CHEST 1 VIEW COMPARISON:  Chest radiograph dated 12/20/2022. FINDINGS: Right-sided Port-A-Cath in similar position. The background of emphysema. No focal consolidation, pleural effusion, or pneumothorax. The cardiac silhouette is within normal limits. Atherosclerotic calcification of the aorta. Lower thoracic vertebroplasty. No acute osseous pathology. IMPRESSION: 1. No active disease. 2. Emphysema. Electronically Signed   By: Elgie Collard M.D.   On: 03/31/2023 14:22    Procedures .Critical Care  Performed by: Su Monks, PA-C Authorized by: Su Monks, PA-C   Critical care provider  statement:    Critical care time (minutes):  30   Critical care was necessary to treat or prevent imminent or life-threatening deterioration of the following conditions:  Trauma   Critical care was time spent personally by me on the following activities:  Development of treatment plan with patient or surrogate, discussions with consultants, evaluation of patient's response to treatment, examination of patient, ordering and review of laboratory studies, ordering and review of radiographic studies, ordering and performing treatments and interventions, pulse oximetry, re-evaluation of patient's condition and review of old charts     Medications Ordered in ED Medications  0.9 %  sodium chloride infusion (has no administration in time range)  fentaNYL (SUBLIMAZE) injection 50 mcg (50 mcg Intravenous Given 03/31/23 1349)  sodium chloride 0.9 % bolus 500 mL (500 mLs Intravenous New Bag/Given 03/31/23 1349)    ED Course/ Medical Decision Making/ A&P                                 Medical Decision Making Amount and/or Complexity of Data Reviewed Labs: ordered. Radiology: ordered.  Risk Prescription drug management.   This patient is a 78 y.o. female  who presents to the ED for concern of mechanical fall at 2000 last night, on ground since.   Differential diagnoses prior to evaluation: The emergent differential diagnosis includes, but is not limited to, actors, dislocation, ligamentous injury, compartment syndrome, rhabdomyolysis. This is not an exhaustive differential.   Past Medical History / Co-morbidities / Social History: anxiety, heart murmur, hypertension, small cell carcinoma of left lower lobe of lung, osteoporosis, chronic low back pain  Additional history: Chart reviewed. Pertinent results include: Most recent echo with LVEF 60 to 65%, as of July 2024  Physical Exam: Physical exam performed. The pertinent findings include: Normal vital signs, no acute distress.  Right hip with  tenderness to palpation, neurovascular intact in bilateral lower extremities although right leg it does appear shortened.  Lab Tests/Imaging studies: I personally interpreted labs/imaging and the pertinent results include: WBC 15.5, hemoglobin stable. CMP with stable creatinine, electrolytes stable. Initial lactic acid 5.5. CK and CMP pending.    Chest x-ray unremarkable.  Pelvis x-ray with acute comminuted intertrochanteric fracture of the proximal right femur with varus angulation.  CT head and cervical  spine without acute abnormalities.  I agree with the radiologist interpretation.  Medications: I ordered medication including IVF and fentanyl.  I have reviewed the patients home medicines and have made adjustments as needed.  Consultations obtained: I consulted with orthopedic PA Tinnie Gens who evaluated patient at bedside. No plans for surgery today, plan for NPO after midnight and reevaluate in AM.  I consulted with hospitalist Dr Imogene Burn who will admit.    Disposition: After consideration of the diagnostic results and the patients response to treatment, I feel that patient is requiring admission for ongoing pain management and plan for orthopedic intervention for right hip fracture.   Final Clinical Impression(s) / ED Diagnoses Final diagnoses:  Displaced intertrochanteric fracture of right femur, initial encounter for closed fracture [facility])  Fall, initial encounter    Rx / DC Orders ED Discharge Orders     None      Portions of this report may have been transcribed using voice recognition software. Every effort was made to ensure accuracy; however, inadvertent computerized transcription errors may be present.    Jeanella Flattery 03/31/23 1454    Virgina Norfolk, DO 03/31/23 1534

## 2023-03-31 NOTE — Assessment & Plan Note (Addendum)
On admission, pt follows with heme/onc. Dtr states pt has completed XRT and chemo.  04-01-2023 stable.  04-02-2023 stable

## 2023-03-31 NOTE — Assessment & Plan Note (Signed)
On admission. Pt take bid ultram at home. Will be on prn oxycodone for now.  04-01-2023 stable.

## 2023-03-31 NOTE — Progress Notes (Signed)
Orthopedic Tech Progress Note Patient Details:  Sheena [name] [employer] 14-Oct-1945 [id number]  Musculoskeletal Traction Type of Traction: Bucks Skin Traction Traction Location: RLE Traction Weight: 5 lbs   Post Interventions Patient Tolerated: Well Instructions Provided: Care of device  Donald Pore 03/31/2023, 4:02 PM

## 2023-03-31 NOTE — Anesthesia Preprocedure Evaluation (Signed)
Anesthesia Evaluation  Patient identified by MRN, date of birth, ID band Patient awake    Reviewed: Allergy & Precautions, NPO status , Patient's Chart, lab work & pertinent test results, reviewed documented beta blocker date and time   History of Anesthesia Complications Negative for: history of anesthetic complications  Airway Mallampati: III       Dental  (+) Poor Dentition   Pulmonary shortness of breath and with exertion, former smoker   breath sounds clear to auscultation       Cardiovascular hypertension, + dysrhythmias Atrial Fibrillation + Valvular Problems/Murmurs  Rhythm:Regular Rate:Normal     Neuro/Psych neg Seizures PSYCHIATRIC DISORDERS Anxiety      Neuromuscular disease    GI/Hepatic   Endo/Other    Renal/GU Renal disease     Musculoskeletal  (+) Arthritis , Osteoarthritis,    Abdominal   Peds  Hematology  (+) Blood dyscrasia, anemia   Anesthesia Other Findings   Reproductive/Obstetrics                              Anesthesia Physical Anesthesia Plan  ASA: 3  Anesthesia Plan: Regional   Post-op Pain Management: Regional block*   Induction:   PONV Risk Score and Plan:   Airway Management Planned:   Additional Equipment:   Intra-op Plan:   Post-operative Plan:   Informed Consent: I have reviewed the patients History and Physical, chart, labs and discussed the procedure including the risks, benefits and alternatives for the proposed anesthesia with the patient or authorized representative who has indicated his/her understanding and acceptance.     Consent reviewed with POA  Plan Discussed with:   Anesthesia Plan Comments:          Anesthesia Quick Evaluation

## 2023-03-31 NOTE — Assessment & Plan Note (Addendum)
On admission. Hold lasix. Consider restarting lopressor tomorrow. Prn labetalol for SBP >170 or DBP >100 04-01-2023 on reduced dose of lopressor 12.5 mg bid with hold orders.   04-02-2023 BP meds have hold orders attached to them.

## 2023-03-31 NOTE — Anesthesia Postprocedure Evaluation (Signed)
Anesthesia Post Note  Patient: Tavaria Mackins [facility]  Procedure(s) Performed: AN AD HOC NERVE BLOCK     Patient location during evaluation: PACU Anesthesia Type: Regional Level of consciousness: awake and alert Pain management: pain level controlled Vital Signs Assessment: post-procedure vital signs reviewed and stable Respiratory status: spontaneous breathing, nonlabored ventilation, respiratory function stable and patient connected to nasal cannula oxygen Cardiovascular status: blood pressure returned to baseline and stable Postop Assessment: no apparent nausea or vomiting Anesthetic complications: no   No notable events documented.  Last Vitals:  Vitals:   03/31/23 1715 03/31/23 1730  BP: (!) 118/54 (!) 120/56  Pulse: 75 75  Resp: 16 17  Temp:    SpO2: 100% 100%    Last Pain:  Vitals:   03/31/23 1700  TempSrc:   PainSc: 10-Worst pain ever                 Mariann Barter

## 2023-03-31 NOTE — ED Notes (Signed)
Patient being taken to PACU for nerve block, returning in less than 30 minutes per PACU staff.

## 2023-03-31 NOTE — Assessment & Plan Note (Addendum)
03-31-2023 noted to have folate deficiency in 10-2022. Repeat RBC folate. Start folic acid 1 mg daily.  Anemia Workup Lab Results  Component Value Date/Time   VITAMINB12 835 10/08/2022 05:39 AM   VITAMINB12 322 04/01/2011 10:57 AM   FOLATE 5.2 (L) 10/08/2022 05:39 AM   FOLATE 6.9 04/01/2011 10:57 AM   TIBC 266 10/08/2022 05:39 AM   IRON 245 (H) 10/08/2022 05:39 AM   04-01-2023 awaiting RBC folate results. On folic acid 1 mg daily.  04-02-2023 still awaiting RBC folate results.

## 2023-03-31 NOTE — Assessment & Plan Note (Addendum)
On admission. Likely due to fall and spending overnight on the floor. Trend CK. Elevated lactic acid due to dehydration and AKI. 04-01-2023 CK 803 today, was 742 yesterday. Not significant rise. Scr has improved with IVF.  04-02-2023 CK 467 today. Can stop checking CK. Scr stable at 1.02

## 2023-03-31 NOTE — Assessment & Plan Note (Addendum)
On admission. Due to spending night on the floor in her apartment without any water. Continue with IVF. Repeat CMP in AM. 04-01-2023 Scr has improved with IVF. Scr 1.03 today. BUN 28. On IVF. Getting PRBC should also help.  04-02-2023 Scr stable at 1.02, BUN 29 today.

## 2023-03-31 NOTE — H&P (View-Only) (Signed)
 Reason for Consult:Right hip fx Referring Physician: Virgina Norfolk Time called: 1406 Time at bedside: 1422   Sheena [name] is an 78 y.o. female.  HPI: Siani fell at home last night. She thinks she tripped. She had immediate right hip pain and could not get up. She was brought to the ED the next day after having spent the night on the floor. Workup showed a right hip fx and orthopedic surgery was consulted. She lives at home alone.  Past Medical History:  Diagnosis Date   Anxiety    Heart murmur    Hypertension    Primary small cell carcinoma of lower lobe of left lung (HCC)    Scarlet fever with other complications childhood   "had to learn to work again" has had leg weakness    Past Surgical History:  Procedure Laterality Date   BRONCHIAL NEEDLE ASPIRATION BIOPSY  06/30/2022   Procedure: BRONCHIAL NEEDLE ASPIRATION BIOPSIES;  Surgeon: [name], DO;  Location: [facility];  Service: Pulmonary;;   IR IMAGING GUIDED PORT INSERTION  07/31/2022   IR KYPHO LUMBAR INC FX REDUCE BONE BX UNI/BIL CANNULATION INC/IMAGING  12/17/2022   IR US GUIDE VASC ACCESS LEFT  12/17/2022   KYPHOPLASTY     2014   peridontal surgery     VIDEO BRONCHOSCOPY WITH ENDOBRONCHIAL ULTRASOUND N/A 06/30/2022   Procedure: VIDEO BRONCHOSCOPY WITH ENDOBRONCHIAL ULTRASOUND;  Surgeon: [name], DO;  Location: [facility];  Service: Pulmonary;  Laterality: N/A;    Family History  Problem Relation Age of Onset   Cancer Mother        breast, spine mets   Hypertension Mother    Heart disease Mother 18       CABG 4 vessel   COPD Father        emphysema   Heart disease Brother 13       MI    Social History:  reports that she quit smoking about 6 years ago. Her smoking use included cigarettes. She started smoking about 51 years ago. She has a 45 pack-year smoking history. She has never used smokeless tobacco. She reports that she does not drink alcohol and does not use drugs.  Allergies:   Allergies  Allergen Reactions   Antihistamines, Diphenhydramine-Type Other (See Comments)    palpitations, feel jittery   Aspirin Other (See Comments)    Stomach cramps   Evista [Raloxifene] Other (See Comments)    Hot Flashes     Medications: I have reviewed the patient's current medications.  Results for orders placed or performed during the hospital encounter of 03/31/23 (from the past 48 hours)  Sample to Blood Bank     Status: None   Collection Time: 03/31/23 12:30 PM  Result Value Ref Range   Blood Bank Specimen SAMPLE AVAILABLE FOR TESTING    Sample Expiration      04/03/2023,2359 Performed at [facility], [address] [place], [place] [address]   CBC     Status: Abnormal   Collection Time: 03/31/23 12:52 PM  Result Value Ref Range   WBC 15.5 (H) 4.0 - 10.5 K/uL   RBC 2.73 (L) 3.87 - 5.11 MIL/uL   Hemoglobin 9.3 (L) 12.0 - 15.0 g/dL   HCT 28.4 (L) 13.2 - 44.0 %   MCV 106.6 (H) 80.0 - 100.0 fL   MCH 34.1 (H) 26.0 - 34.0 pg   MCHC 32.0 30.0 - 36.0 g/dL   RDW 10.2 72.5 - 36.6 %  Platelets 188 150 - 400 K/uL   nRBC 0.0 0.0 - 0.2 %    Comment: Performed at [facility], [address], [place], [place] [address]  Protime-INR     Status: None   Collection Time: 03/31/23 12:52 PM  Result Value Ref Range   Prothrombin Time 13.7 11.4 - 15.2 seconds   INR 1.0 0.8 - 1.2    Comment: (NOTE) INR goal varies based on device and disease states. Performed at [facility], [address] [place], [place] [address]   I-Stat Chem 8, ED     Status: Abnormal   Collection Time: 03/31/23  1:09 PM  Result Value Ref Range   Sodium 138 135 - 145 mmol/L   Potassium 4.8 3.5 - 5.1 mmol/L   Chloride 103 98 - 111 mmol/L   BUN 48 (H) 8 - 23 mg/dL   Creatinine, Ser 1.30 (H) 0.44 - 1.00 mg/dL   Glucose, Bld 865 (H) 70 - 99 mg/dL    Comment: Glucose reference range applies only to samples taken after fasting for at least 8 hours.   Calcium, Ion 1.12 (L) 1.15 - 1.40  mmol/L   TCO2 24 22 - 32 mmol/L   Hemoglobin 10.5 (L) 12.0 - 15.0 g/dL   HCT 78.4 (L) 69.6 - 29.5 %  I-Stat Lactic Acid, ED     Status: Abnormal   Collection Time: 03/31/23  1:10 PM  Result Value Ref Range   Lactic Acid, Venous 5.4 (HH) 0.5 - 1.9 mmol/L   Comment NOTIFIED PHYSICIAN     DG Pelvis Portable Result Date: 03/31/2023 CLINICAL DATA:  Fall, right hip pain EXAM: PORTABLE PELVIS 1-2 VIEWS COMPARISON:  12/09/2022 FINDINGS: Acute, comminuted intertrochanteric fracture of the proximal right femur with approximately 90 degrees of varus angulation. Hip joint intact without dislocation. Bony pelvis intact without fracture or diastasis. Soft tissue swelling is evident the lateral aspect of the right hip. IMPRESSION: Acute, comminuted intertrochanteric fracture of the proximal right femur with varus angulation. Electronically Signed   By: Duanne Guess D.O.   On: 03/31/2023 14:23   DG Chest Portable 1 View Result Date: 03/31/2023 CLINICAL DATA:  Fall and right hip fracture. EXAM: PORTABLE CHEST 1 VIEW COMPARISON:  Chest radiograph dated 12/20/2022. FINDINGS: Right-sided Port-A-Cath in similar position. The background of emphysema. No focal consolidation, pleural effusion, or pneumothorax. The cardiac silhouette is within normal limits. Atherosclerotic calcification of the aorta. Lower thoracic vertebroplasty. No acute osseous pathology. IMPRESSION: 1. No active disease. 2. Emphysema. Electronically Signed   By: Elgie Collard M.D.   On: 03/31/2023 14:22    Review of Systems  HENT:  Negative for ear discharge, ear pain, hearing loss and tinnitus.   Eyes:  Negative for photophobia and pain.  Respiratory:  Negative for cough and shortness of breath.   Cardiovascular:  Negative for chest pain.  Gastrointestinal:  Negative for abdominal pain, nausea and vomiting.  Genitourinary:  Negative for dysuria, flank pain, frequency and urgency.  Musculoskeletal:  Positive for arthralgias (Right hip).  Negative for back pain, myalgias and neck pain.  Neurological:  Negative for dizziness and headaches.  Hematological:  Does not bruise/bleed easily.  Psychiatric/Behavioral:  The patient is not nervous/anxious.    Blood pressure 137/60, pulse 74, temperature 97.6 F (36.4 C), temperature source Oral, resp. rate 13, height 5' (1.524 m), weight 39.5 kg, SpO2 96%. Physical Exam Constitutional:      General: She is not in acute distress.  Appearance: She is well-developed. She is not diaphoretic.  HENT:     Head: Normocephalic and atraumatic.  Eyes:     General: No scleral icterus.       Right eye: No discharge.        Left eye: No discharge.     Conjunctiva/sclera: Conjunctivae normal.  Cardiovascular:     Rate and Rhythm: Normal rate and regular rhythm.  Pulmonary:     Effort: Pulmonary effort is normal. No respiratory distress.  Musculoskeletal:     Cervical back: Normal range of motion.     Comments: RLE No traumatic wounds, ecchymosis, or rash  Mod TTP hip  No knee or ankle effusion  Knee stable to varus/ valgus and anterior/posterior stress  Sens DPN, SPN, TN intact  Motor EHL, ext, flex, evers 5/5  DP 2+, PT 2+, No significant edema  Skin:    General: Skin is warm and dry.  Neurological:     Mental Status: She is alert.  Psychiatric:        Mood and Affect: Mood normal.        Behavior: Behavior normal.     Assessment/Plan: Right hip fx -- Plan IMN tomorrow with Dr. Carola Frost. Please keep NPO after MN.    Freeman Caldron, PA-C Orthopedic Surgery [phone] 03/31/2023, 2:28 PM

## 2023-03-31 NOTE — H&P (Addendum)
History and Physical    Lelani Garnett [facility] ZOX:[id number] DOB: Jul 19, 1945 DOA: 03/31/2023  DOS: the patient was seen and examined on 03/31/2023  PCP: [name], MD   Patient coming from: Home  I have personally briefly reviewed patient's old medical records in  Link  HPI: 78 year old female history of lung cancer, hypertension, paroxysmal atrial fibrillation not on systemic anticoagulation due to history of GI bleeding, chronic low back pain presents the ER today after being found down in her apartment.  Patient states that yesterday evening, after dinner she threw away her paper plate into the garbage can.  She turned around and spun too quickly and fell.  She landed on the floor.  She was unable to get up.  She was found about 12 hours later by her daughter.  EMS was called.  Patient brought to the ER.  Right lower extremity externally rotated and shortened.  Pelvic x-ray shows acute right intertrochanteric fracture with varus angulation.  CK was elevated at 742, initial lactic acid was 5.4.  Patient does not complain of any abdominal pain.  Orthopedics consulted for hip fracture.  [facility] hospital[facility] consulted for admission.  Significant Events: Admitted 03/31/2023 admitted for right intertroch fracture with varus angulation.   Significant Labs: WBC 15.5, HgB 9.3, plt 188 BUN 36, scr 1.49 CK 742  Significant Imaging Studies: Pelvic XR Acute, comminuted intertrochanteric fracture of the proximal right femur with varus angulation. CT head/c-spine No evidence of acute intracranial abnormality or cervical spine fracture.  Antibiotic Therapy: Anti-infectives (From admission, onward)    None       Procedures:   Consultants: orthopedics    ED Course: pelvic XR shows acute right intertrochanteric fracture  Review of Systems:  Review of Systems  Constitutional:  Negative for chills and fever.  HENT: Negative.    Eyes: Negative.   Respiratory:  Negative.    Cardiovascular: Negative.   Gastrointestinal: Negative.   Genitourinary: Negative.   Musculoskeletal:  Positive for joint pain.  Skin: Negative.   Neurological: Negative.   Endo/Heme/Allergies: Negative.   Psychiatric/Behavioral: Negative.    All other systems reviewed and are negative.   Past Medical History:  Diagnosis Date   Anxiety    Heart murmur    Hypertension    Primary small cell carcinoma of lower lobe of left lung (HCC)    Scarlet fever with other complications childhood   "had to learn to work again" has had leg weakness    Past Surgical History:  Procedure Laterality Date   BRONCHIAL NEEDLE ASPIRATION BIOPSY  06/30/2022   Procedure: BRONCHIAL NEEDLE ASPIRATION BIOPSIES;  Surgeon: [name], DO;  Location: [facility];  Service: Pulmonary;;   IR IMAGING GUIDED PORT INSERTION  07/31/2022   IR KYPHO LUMBAR INC FX REDUCE BONE BX UNI/BIL CANNULATION INC/IMAGING  12/17/2022   IR US GUIDE VASC ACCESS LEFT  12/17/2022   KYPHOPLASTY     2014   peridontal surgery     VIDEO BRONCHOSCOPY WITH ENDOBRONCHIAL ULTRASOUND N/A 06/30/2022   Procedure: VIDEO BRONCHOSCOPY WITH ENDOBRONCHIAL ULTRASOUND;  Surgeon: [name], DO;  Location: [facility];  Service: Pulmonary;  Laterality: N/A;     reports that she quit smoking about 6 years ago. Her smoking use included cigarettes. She started smoking about 51 years ago. She has a 45 pack-year smoking history. She has never used smokeless tobacco. She reports that she does not drink alcohol and does not use drugs.  Allergies  Allergen Reactions  Antihistamines, Diphenhydramine-Type Other (See Comments)    palpitations, feel jittery   Aspirin Other (See Comments)    Stomach cramps   Evista [Raloxifene] Other (See Comments)    Hot Flashes     Family History  Problem Relation Age of Onset   Cancer Mother        breast, spine mets   Hypertension Mother    Heart disease Mother 78       CABG 4 vessel    COPD Father        emphysema   Heart disease Brother 46       MI    Prior to Admission medications   Medication Sig Start Date End Date Taking? Authorizing Provider  acetaminophen (TYLENOL) 500 MG tablet Take 1,000 mg by mouth 2 (two) times daily as needed for mild pain.    [provider]  amiodarone (PACERONE) 200 MG tablet Take 1 tablet (200 mg total) by mouth daily. 10/14/22   Duke, Roe Rutherford, PA  amLODipine (NORVASC) 5 MG tablet Take 5 mg by mouth daily. 01/02/23   [provider]  atorvastatin (LIPITOR) 20 MG tablet TAKE 1 TABLET BY MOUTH EVERY DAY Patient taking differently: Take 20 mg by mouth daily. 05/25/22   [name], MD  Calcium Citrate-Vitamin D (CALCIUM CITRATE PETITE/VIT D PO) Take 1 tablet by mouth 2 (two) times daily.    [provider]  calcium-vitamin D (OYSTER CALCIUM 500 + D) 500-200 MG-UNIT tablet Take 1 tablet by mouth 2 (two) times daily. 01/22/23   [provider]  famotidine (PEPCID) 20 MG tablet Take 20 mg by mouth daily as needed for heartburn.    [provider]  lidocaine-prilocaine (EMLA) cream Apply 1 Application topically as needed. 03/16/23   Si Gaul, MD  Magnesium Citrate 125 MG CAPS Take 2 capsules by mouth 2 (two) times daily. Patient taking differently: Take 125 mg by mouth 2 (two) times daily. 10/14/22   Duke, Roe Rutherford, PA  magnesium oxide (MAG-OX) 400 MG tablet Take 1 tablet by mouth daily. 01/22/23   [provider]  metoprolol tartrate (LOPRESSOR) 25 MG tablet Take 12.5 mg by mouth 2 (two) times daily.    [provider]  mirtazapine (REMERON) 7.5 MG tablet Take 1 tablet (7.5 mg total) by mouth at bedtime. 12/21/22   Wouk, Wilfred Curtis, MD  polyethylene glycol (MIRALAX / GLYCOLAX) 17 g packet Take 17 g by mouth daily as needed for mild constipation. 12/21/22   Wouk, Wilfred Curtis, MD  PRESCRIPTION MEDICATION Apply 1 application  topically in the morning and at bedtime.  Unknown cream given to Pt from the cancer center for after radiation    [provider]  prochlorperazine (COMPAZINE) 10 MG tablet Take 1 tablet (10 mg total) by mouth every 6 (six) hours as needed for nausea or vomiting. 08/04/22   Heilingoetter, Cassandra L, PA-C  sucralfate (CARAFATE) 1 GM/10ML suspension Take 10 mLs (1 g total) by mouth 4 (four) times daily -  with meals and at bedtime. 08/26/22   Ronny Bacon, PA-C  traMADol (ULTRAM) 50 MG tablet Take 1 tablet (50 mg total) by mouth 2 (two) times daily as needed. 02/03/23   Jeani Sow, MD    Physical Exam: Vitals:   03/31/23 1257 03/31/23 1300 03/31/23 1315 03/31/23 1330  BP: (!) 134/58 (!) 134/58 112/60 137/60  Pulse:  73 89 74  Resp: 19 16 (!) 25 13  Temp:      TempSrc:  SpO2:  100% 100% 96%  Weight:      Height:        Physical Exam Vitals and nursing note reviewed.  Constitutional:      General: She is not in acute distress.    Appearance: She is not toxic-appearing.     Comments: Thin, frail, chronically ill appearing  HENT:     Head:      Comments: Dried blood noted over right eyebrow Eyes:     General: No scleral icterus. Cardiovascular:     Rate and Rhythm: Normal rate and regular rhythm.  Pulmonary:     Effort: Pulmonary effort is normal.     Breath sounds: Normal breath sounds.  Abdominal:     General: Abdomen is flat. Bowel sounds are normal. There is no distension.     Palpations: Abdomen is soft.  Musculoskeletal:     Comments: Right LE externally rotated and shortened  Skin:    General: Skin is warm and dry.     Capillary Refill: Capillary refill takes less than 2 seconds.  Neurological:     General: No focal deficit present.     Mental Status: She is alert and oriented to person, place, and time.      Labs on Admission: I have personally reviewed following labs and imaging studies  CBC: Recent Labs  Lab 03/31/23 1252 03/31/23 1309  WBC 15.5*  --   HGB 9.3* 10.5*   HCT 29.1* 31.0*  MCV 106.6*  --   PLT 188  --    Basic Metabolic Panel: Recent Labs  Lab 03/31/23 1252 03/31/23 1309  NA 139 138  K 4.7 4.8  CL 101 103  CO2 20*  --   GLUCOSE 177* 170*  BUN 36* 48*  CREATININE 1.49* 1.30*  CALCIUM 9.8  --    GFR: Estimated Creatinine Clearance: 23.3 mL/min (A) (by C-G formula based on SCr of 1.3 mg/dL (H)). Liver Function Tests: Recent Labs  Lab 03/31/23 1252  AST 42*  ALT 18  ALKPHOS 52  BILITOT 0.5  PROT 7.3  ALBUMIN 3.9   Coagulation Profile: Recent Labs  Lab 03/31/23 1252  INR 1.0   Cardiac Enzymes: Recent Labs  Lab 03/31/23 1252  CKTOTAL 742*  CKMB 13.5*   BNP (last 3 results) Recent Labs    12/09/22 2152  BNP 254.3*   Radiological Exams on Admission: I have personally reviewed images CT Head Wo Contrast Result Date: 03/31/2023 CLINICAL DATA:  Head trauma, moderate-severe; Neck trauma (Age >= 65y). Fall last night. Forehead laceration. EXAM: CT HEAD WITHOUT CONTRAST CT CERVICAL SPINE WITHOUT CONTRAST TECHNIQUE: Multidetector CT imaging of the head and cervical spine was performed following the standard protocol without intravenous contrast. Multiplanar CT image reconstructions of the cervical spine were also generated. RADIATION DOSE REDUCTION: This exam was performed according to the departmental dose-optimization program which includes automated exposure control, adjustment of the mA and/or kV according to patient size and/or use of iterative reconstruction technique. COMPARISON:  Head MRI 01/30/2023 FINDINGS: CT HEAD FINDINGS Brain: There is no evidence of an acute infarct, intracranial hemorrhage, mass, midline shift, or extra-axial fluid collection. There is mild generalized cerebral atrophy. Cerebral white matter hypodensities are nonspecific but compatible with mild chronic small vessel ischemic disease. Vascular: Calcified atherosclerosis at the skull base. No hyperdense vessel. Skull: No acute fracture or  destructive lesion. Sinuses/Orbits: The included paranasal sinuses and mastoid air cells are largely clear. Unremarkable orbits. Other: None. CT CERVICAL SPINE FINDINGS Alignment: Cervical  spine straightening. Trace anterolisthesis of C7 on T1, likely degenerative. Skull base and vertebrae: No acute fracture or suspicious osseous lesion. Moderate atlantodental degenerative changes. Soft tissues and spinal canal: No prevertebral fluid or swelling. No visible canal hematoma. Disc levels: Very mild disc degeneration for age. Up to moderate multilevel facet arthrosis. Upper chest: Emphysema. Other: Calcified atherosclerosis about the left greater than right carotid bifurcations. Partially visualized right internal jugular venous catheter. IMPRESSION: 1. No evidence of acute intracranial abnormality or cervical spine fracture. 2. Mild chronic small vessel ischemic disease. Electronically Signed   By: Sebastian Ache M.D.   On: 03/31/2023 14:34   CT Cervical Spine Wo Contrast Result Date: 03/31/2023 CLINICAL DATA:  Head trauma, moderate-severe; Neck trauma (Age >= 65y). Fall last night. Forehead laceration. EXAM: CT HEAD WITHOUT CONTRAST CT CERVICAL SPINE WITHOUT CONTRAST TECHNIQUE: Multidetector CT imaging of the head and cervical spine was performed following the standard protocol without intravenous contrast. Multiplanar CT image reconstructions of the cervical spine were also generated. RADIATION DOSE REDUCTION: This exam was performed according to the departmental dose-optimization program which includes automated exposure control, adjustment of the mA and/or kV according to patient size and/or use of iterative reconstruction technique. COMPARISON:  Head MRI 01/30/2023 FINDINGS: CT HEAD FINDINGS Brain: There is no evidence of an acute infarct, intracranial hemorrhage, mass, midline shift, or extra-axial fluid collection. There is mild generalized cerebral atrophy. Cerebral white matter hypodensities are nonspecific  but compatible with mild chronic small vessel ischemic disease. Vascular: Calcified atherosclerosis at the skull base. No hyperdense vessel. Skull: No acute fracture or destructive lesion. Sinuses/Orbits: The included paranasal sinuses and mastoid air cells are largely clear. Unremarkable orbits. Other: None. CT CERVICAL SPINE FINDINGS Alignment: Cervical spine straightening. Trace anterolisthesis of C7 on T1, likely degenerative. Skull base and vertebrae: No acute fracture or suspicious osseous lesion. Moderate atlantodental degenerative changes. Soft tissues and spinal canal: No prevertebral fluid or swelling. No visible canal hematoma. Disc levels: Very mild disc degeneration for age. Up to moderate multilevel facet arthrosis. Upper chest: Emphysema. Other: Calcified atherosclerosis about the left greater than right carotid bifurcations. Partially visualized right internal jugular venous catheter. IMPRESSION: 1. No evidence of acute intracranial abnormality or cervical spine fracture. 2. Mild chronic small vessel ischemic disease. Electronically Signed   By: Sebastian Ache M.D.   On: 03/31/2023 14:34   DG Pelvis Portable Result Date: 03/31/2023 CLINICAL DATA:  Fall, right hip pain EXAM: PORTABLE PELVIS 1-2 VIEWS COMPARISON:  12/09/2022 FINDINGS: Acute, comminuted intertrochanteric fracture of the proximal right femur with approximately 90 degrees of varus angulation. Hip joint intact without dislocation. Bony pelvis intact without fracture or diastasis. Soft tissue swelling is evident the lateral aspect of the right hip. IMPRESSION: Acute, comminuted intertrochanteric fracture of the proximal right femur with varus angulation. Electronically Signed   By: Duanne Guess D.O.   On: 03/31/2023 14:23   DG Chest Portable 1 View Result Date: 03/31/2023 CLINICAL DATA:  Fall and right hip fracture. EXAM: PORTABLE CHEST 1 VIEW COMPARISON:  Chest radiograph dated 12/20/2022. FINDINGS: Right-sided Port-A-Cath in  similar position. The background of emphysema. No focal consolidation, pleural effusion, or pneumothorax. The cardiac silhouette is within normal limits. Atherosclerotic calcification of the aorta. Lower thoracic vertebroplasty. No acute osseous pathology. IMPRESSION: 1. No active disease. 2. Emphysema. Electronically Signed   By: Elgie Collard M.D.   On: 03/31/2023 14:22   EKG: My personal interpretation of EKG shows: No EKG  Assessment/Plan Principal Problem:   Closed intertrochanteric fracture  of hip, right, initial encounter [facility]) Active Problems:   Traumatic rhabdomyolysis (HCC)   AKI (acute kidney injury) (HCC)   Essential hypertension, benign   Chronic low back pain   Hyperlipidemia   Primary small cell carcinoma of lower lobe of left lung (HCC)   PAF (paroxysmal atrial fibrillation) (HCC) - not on systemic anticoagulation due to hx of GI bleeding   Macrocytic anemia   DNR (do not resuscitate)/DNI(Do Not Intubate)   Assessment and Plan: * Closed intertrochanteric fracture of hip, right, initial encounter (HCC) On admission, NPO after MN. Ortho consulted. IV dilaudid prn for severe pain. Oxycodone prn for mod or severe pain.  AKI (acute kidney injury) (HCC) On admission. Due to spending night on the floor in her apartment without any water. Continue with IVF. Repeat CMP in AM.  Traumatic rhabdomyolysis (HCC) On admission. Likely due to fall and spending overnight on the floor. Trend CK. Elevated lactic acid due to dehydration and AKI.  DNR (do not resuscitate)/DNI(Do Not Intubate) On admission, I verified pt's desire to be a DNR/DNI. Pt informed that she would need to be a FULL CODE during surgery but could be changed back to DNR/DNI afterwards. Pt's dtr tabitha a witness to pt making herself a DNR/DNI. Pt is competent to make medical decisions.  Macrocytic anemia 03-31-2023 noted to have folate deficiency in 10-2022. Repeat RBC folate. Start folic acid 1 mg daily.  Anemia  Workup Lab Results  Component Value Date/Time   VITAMINB12 835 10/08/2022 05:39 AM   VITAMINB12 322 04/01/2011 10:57 AM   FOLATE 5.2 (L) 10/08/2022 05:39 AM   FOLATE 6.9 04/01/2011 10:57 AM   TIBC 266 10/08/2022 05:39 AM   IRON 245 (H) 10/08/2022 05:39 AM     PAF (paroxysmal atrial fibrillation) (HCC) - not on systemic anticoagulation due to hx of GI bleeding On admission, telemetry shows NSR. Not on systemic anticoagulation due to hx of GI bleeding.  Primary small cell carcinoma of lower lobe of left lung (HCC) On admission, pt follows with heme/onc. Dtr states pt has completed XRT and chemo.   Hyperlipidemia On admission, hold statin due to rhabdo  Chronic low back pain On admission. Pt take bid ultram at home. Will be on prn oxycodone for now.  Essential hypertension, benign On admission. Hold lasix. Consider restarting lopressor tomorrow. Prn labetalol for SBP >170 or DBP >100   DVT prophylaxis: SCDs Code Status: DNR/DNI(Do NOT Intubate) Family Communication: discussed with pt and dtr tabitha at bedside  Disposition Plan: SNF  Consults called: orthoepdics  Admission status: Inpatient, Med-Surg   Carollee Herter, DO [facility] 03/31/2023, 3:26 PM

## 2023-03-31 NOTE — Assessment & Plan Note (Addendum)
On admission, telemetry shows NSR. Not on systemic anticoagulation due to hx of GI bleeding. 04-01-2023 stable.  04-02-2023 stable

## 2023-03-31 NOTE — Anesthesia Procedure Notes (Signed)
Anesthesia Regional Block: Femoral nerve block   Pre-Anesthetic Checklist: , timeout performed,  Correct Patient, Correct Site, Correct Laterality,  Correct Procedure, Correct Position, site marked,  Risks and benefits discussed,  Surgical consent,  Pre-op evaluation,  At surgeon's request and post-op pain management  Laterality: Right  Prep: Maximum Sterile Barrier Precautions used, chloraprep       Needles:  Injection technique: Single-shot  Needle Type: Echogenic Needle      Needle Gauge: 20     Additional Needles:   Procedures:,,,, ultrasound used (permanent image in chart),,    Narrative:  Start time: 03/31/2023 5:39 PM End time: 03/31/2023 5:43 PM Injection made incrementally with aspirations every 5 mL.  Performed by: Personally  Anesthesiologist: Mariann Barter, MD

## 2023-03-31 NOTE — Assessment & Plan Note (Addendum)
On admission, I verified pt's desire to be a DNR/DNI. Pt informed that she would need to be a FULL CODE during surgery but could be changed back to DNR/DNI afterwards. Pt's dtr tabitha a witness to pt making herself a DNR/DNI. Pt is competent to make medical decisions.  04-01-2023 stable.

## 2023-03-31 NOTE — Consult Note (Signed)
Reason for Consult:Right hip fx Referring Physician: Virgina Norfolk Time called: 1406 Time at bedside: 1422   Sheena [name] is an 78 y.o. female.  HPI: Siani fell at home last night. She thinks she tripped. She had immediate right hip pain and could not get up. She was brought to the ED the next day after having spent the night on the floor. Workup showed a right hip fx and orthopedic surgery was consulted. She lives at home alone.  Past Medical History:  Diagnosis Date   Anxiety    Heart murmur    Hypertension    Primary small cell carcinoma of lower lobe of left lung (HCC)    Scarlet fever with other complications childhood   "had to learn to work again" has had leg weakness    Past Surgical History:  Procedure Laterality Date   BRONCHIAL NEEDLE ASPIRATION BIOPSY  06/30/2022   Procedure: BRONCHIAL NEEDLE ASPIRATION BIOPSIES;  Surgeon: [name], DO;  Location: [facility];  Service: Pulmonary;;   IR IMAGING GUIDED PORT INSERTION  07/31/2022   IR KYPHO LUMBAR INC FX REDUCE BONE BX UNI/BIL CANNULATION INC/IMAGING  12/17/2022   IR US GUIDE VASC ACCESS LEFT  12/17/2022   KYPHOPLASTY     2014   peridontal surgery     VIDEO BRONCHOSCOPY WITH ENDOBRONCHIAL ULTRASOUND N/A 06/30/2022   Procedure: VIDEO BRONCHOSCOPY WITH ENDOBRONCHIAL ULTRASOUND;  Surgeon: [name], DO;  Location: [facility];  Service: Pulmonary;  Laterality: N/A;    Family History  Problem Relation Age of Onset   Cancer Mother        breast, spine mets   Hypertension Mother    Heart disease Mother 18       CABG 4 vessel   COPD Father        emphysema   Heart disease Brother 13       MI    Social History:  reports that she quit smoking about 6 years ago. Her smoking use included cigarettes. She started smoking about 51 years ago. She has a 45 pack-year smoking history. She has never used smokeless tobacco. She reports that she does not drink alcohol and does not use drugs.  Allergies:   Allergies  Allergen Reactions   Antihistamines, Diphenhydramine-Type Other (See Comments)    palpitations, feel jittery   Aspirin Other (See Comments)    Stomach cramps   Evista [Raloxifene] Other (See Comments)    Hot Flashes     Medications: I have reviewed the patient's current medications.  Results for orders placed or performed during the hospital encounter of 03/31/23 (from the past 48 hours)  Sample to Blood Bank     Status: None   Collection Time: 03/31/23 12:30 PM  Result Value Ref Range   Blood Bank Specimen SAMPLE AVAILABLE FOR TESTING    Sample Expiration      04/03/2023,2359 Performed at [facility], [address] [place], [place] [address]   CBC     Status: Abnormal   Collection Time: 03/31/23 12:52 PM  Result Value Ref Range   WBC 15.5 (H) 4.0 - 10.5 K/uL   RBC 2.73 (L) 3.87 - 5.11 MIL/uL   Hemoglobin 9.3 (L) 12.0 - 15.0 g/dL   HCT 28.4 (L) 13.2 - 44.0 %   MCV 106.6 (H) 80.0 - 100.0 fL   MCH 34.1 (H) 26.0 - 34.0 pg   MCHC 32.0 30.0 - 36.0 g/dL   RDW 10.2 72.5 - 36.6 %  Platelets 188 150 - 400 K/uL   nRBC 0.0 0.0 - 0.2 %    Comment: Performed at [facility], [address], [place], [place] [address]  Protime-INR     Status: None   Collection Time: 03/31/23 12:52 PM  Result Value Ref Range   Prothrombin Time 13.7 11.4 - 15.2 seconds   INR 1.0 0.8 - 1.2    Comment: (NOTE) INR goal varies based on device and disease states. Performed at [facility], [address] [place], [place] [address]   I-Stat Chem 8, ED     Status: Abnormal   Collection Time: 03/31/23  1:09 PM  Result Value Ref Range   Sodium 138 135 - 145 mmol/L   Potassium 4.8 3.5 - 5.1 mmol/L   Chloride 103 98 - 111 mmol/L   BUN 48 (H) 8 - 23 mg/dL   Creatinine, Ser 1.30 (H) 0.44 - 1.00 mg/dL   Glucose, Bld 865 (H) 70 - 99 mg/dL    Comment: Glucose reference range applies only to samples taken after fasting for at least 8 hours.   Calcium, Ion 1.12 (L) 1.15 - 1.40  mmol/L   TCO2 24 22 - 32 mmol/L   Hemoglobin 10.5 (L) 12.0 - 15.0 g/dL   HCT 78.4 (L) 69.6 - 29.5 %  I-Stat Lactic Acid, ED     Status: Abnormal   Collection Time: 03/31/23  1:10 PM  Result Value Ref Range   Lactic Acid, Venous 5.4 (HH) 0.5 - 1.9 mmol/L   Comment NOTIFIED PHYSICIAN     DG Pelvis Portable Result Date: 03/31/2023 CLINICAL DATA:  Fall, right hip pain EXAM: PORTABLE PELVIS 1-2 VIEWS COMPARISON:  12/09/2022 FINDINGS: Acute, comminuted intertrochanteric fracture of the proximal right femur with approximately 90 degrees of varus angulation. Hip joint intact without dislocation. Bony pelvis intact without fracture or diastasis. Soft tissue swelling is evident the lateral aspect of the right hip. IMPRESSION: Acute, comminuted intertrochanteric fracture of the proximal right femur with varus angulation. Electronically Signed   By: Duanne Guess D.O.   On: 03/31/2023 14:23   DG Chest Portable 1 View Result Date: 03/31/2023 CLINICAL DATA:  Fall and right hip fracture. EXAM: PORTABLE CHEST 1 VIEW COMPARISON:  Chest radiograph dated 12/20/2022. FINDINGS: Right-sided Port-A-Cath in similar position. The background of emphysema. No focal consolidation, pleural effusion, or pneumothorax. The cardiac silhouette is within normal limits. Atherosclerotic calcification of the aorta. Lower thoracic vertebroplasty. No acute osseous pathology. IMPRESSION: 1. No active disease. 2. Emphysema. Electronically Signed   By: Elgie Collard M.D.   On: 03/31/2023 14:22    Review of Systems  HENT:  Negative for ear discharge, ear pain, hearing loss and tinnitus.   Eyes:  Negative for photophobia and pain.  Respiratory:  Negative for cough and shortness of breath.   Cardiovascular:  Negative for chest pain.  Gastrointestinal:  Negative for abdominal pain, nausea and vomiting.  Genitourinary:  Negative for dysuria, flank pain, frequency and urgency.  Musculoskeletal:  Positive for arthralgias (Right hip).  Negative for back pain, myalgias and neck pain.  Neurological:  Negative for dizziness and headaches.  Hematological:  Does not bruise/bleed easily.  Psychiatric/Behavioral:  The patient is not nervous/anxious.    Blood pressure 137/60, pulse 74, temperature 97.6 F (36.4 C), temperature source Oral, resp. rate 13, height 5' (1.524 m), weight 39.5 kg, SpO2 96%. Physical Exam Constitutional:      General: She is not in acute distress.  Appearance: She is well-developed. She is not diaphoretic.  HENT:     Head: Normocephalic and atraumatic.  Eyes:     General: No scleral icterus.       Right eye: No discharge.        Left eye: No discharge.     Conjunctiva/sclera: Conjunctivae normal.  Cardiovascular:     Rate and Rhythm: Normal rate and regular rhythm.  Pulmonary:     Effort: Pulmonary effort is normal. No respiratory distress.  Musculoskeletal:     Cervical back: Normal range of motion.     Comments: RLE No traumatic wounds, ecchymosis, or rash  Mod TTP hip  No knee or ankle effusion  Knee stable to varus/ valgus and anterior/posterior stress  Sens DPN, SPN, TN intact  Motor EHL, ext, flex, evers 5/5  DP 2+, PT 2+, No significant edema  Skin:    General: Skin is warm and dry.  Neurological:     Mental Status: She is alert.  Psychiatric:        Mood and Affect: Mood normal.        Behavior: Behavior normal.     Assessment/Plan: Right hip fx -- Plan IMN tomorrow with Dr. Carola Frost. Please keep NPO after MN.    Freeman Caldron, PA-C Orthopedic Surgery [phone] 03/31/2023, 2:28 PM

## 2023-03-31 NOTE — Assessment & Plan Note (Addendum)
On admission, hold statin due to rhabdo 04-01-2023 continue to hold statin.  04-02-2023 restart statin at discharge

## 2023-03-31 NOTE — ED Triage Notes (Addendum)
Pt to ED via EMS from home after tripping and falling last night at 2000 & has been on the floor since. No thinners. Small right forehead lac. Bleeding contained. Right leg shortened. +leg/hip pain. In c-collar. Unsure of LOC

## 2023-03-31 NOTE — Hospital Course (Addendum)
Sheena [name] is a 78 y.o. female with a history of lung cancer, hypertension, paroxysmal atrial fibrillation not on anticoagulation, chronic low back pain.  Patient presented secondary to being found down in her apartment after a fall the day prior to admission with resultant right hip fracture.  Orthopedic surgery was consulted and performed an intramedullary nail placement on 1/31.  Hospitalization complicated by evidence of rhabdomyolysis on admission managed with IV fluids, in addition to severe anemia status post 2 units of PRBCs.

## 2023-03-31 NOTE — Assessment & Plan Note (Addendum)
On admission, NPO after MN. Ortho consulted. IV dilaudid prn for severe pain. Oxycodone prn for mod or severe pain. 04-01-2023 continue with PRN IV dilaudid. Hopefully pt to OR today for ORIF.  04-02-2023 s/p ORIF today. PT/OT assessment tomorrow. Pt and family to decide if they want SNF placement vs DC to home with home health PT.

## 2023-04-01 ENCOUNTER — Encounter (HOSPITAL_COMMUNITY): Payer: Self-pay | Admitting: Internal Medicine

## 2023-04-01 ENCOUNTER — Inpatient Hospital Stay (HOSPITAL_COMMUNITY): Payer: 59

## 2023-04-01 DIAGNOSIS — N179 Acute kidney failure, unspecified: Secondary | ICD-10-CM | POA: Diagnosis not present

## 2023-04-01 DIAGNOSIS — S72141A Displaced intertrochanteric fracture of right femur, initial encounter for closed fracture: Secondary | ICD-10-CM | POA: Diagnosis not present

## 2023-04-01 DIAGNOSIS — T796XXA Traumatic ischemia of muscle, initial encounter: Secondary | ICD-10-CM | POA: Diagnosis not present

## 2023-04-01 DIAGNOSIS — D62 Acute posthemorrhagic anemia: Secondary | ICD-10-CM | POA: Diagnosis not present

## 2023-04-01 LAB — CBC WITH DIFFERENTIAL/PLATELET
Abs Immature Granulocytes: 0.05 10*3/uL (ref 0.00–0.07)
Basophils Absolute: 0 10*3/uL (ref 0.0–0.1)
Basophils Relative: 0 %
Eosinophils Absolute: 0 10*3/uL (ref 0.0–0.5)
Eosinophils Relative: 0 %
HCT: 20 % — ABNORMAL LOW (ref 36.0–46.0)
Hemoglobin: 6.3 g/dL — CL (ref 12.0–15.0)
Immature Granulocytes: 1 %
Lymphocytes Relative: 4 %
Lymphs Abs: 0.4 10*3/uL — ABNORMAL LOW (ref 0.7–4.0)
MCH: 34.4 pg — ABNORMAL HIGH (ref 26.0–34.0)
MCHC: 31.5 g/dL (ref 30.0–36.0)
MCV: 109.3 fL — ABNORMAL HIGH (ref 80.0–100.0)
Monocytes Absolute: 0.4 10*3/uL (ref 0.1–1.0)
Monocytes Relative: 4 %
Neutro Abs: 8.8 10*3/uL — ABNORMAL HIGH (ref 1.7–7.7)
Neutrophils Relative %: 91 %
Platelets: 107 10*3/uL — ABNORMAL LOW (ref 150–400)
RBC: 1.83 MIL/uL — ABNORMAL LOW (ref 3.87–5.11)
RDW: 14.8 % (ref 11.5–15.5)
WBC: 9.7 10*3/uL (ref 4.0–10.5)
nRBC: 0 % (ref 0.0–0.2)

## 2023-04-01 LAB — POCT I-STAT, CHEM 8
BUN: 31 mg/dL — ABNORMAL HIGH (ref 8–23)
Calcium, Ion: 1.09 mmol/L — ABNORMAL LOW (ref 1.15–1.40)
Chloride: 106 mmol/L (ref 98–111)
Creatinine, Ser: 1 mg/dL (ref 0.44–1.00)
Glucose, Bld: 108 mg/dL — ABNORMAL HIGH (ref 70–99)
HCT: 35 % — ABNORMAL LOW (ref 36.0–46.0)
Hemoglobin: 11.9 g/dL — ABNORMAL LOW (ref 12.0–15.0)
Potassium: 5.4 mmol/L — ABNORMAL HIGH (ref 3.5–5.1)
Sodium: 138 mmol/L (ref 135–145)
TCO2: 23 mmol/L (ref 22–32)

## 2023-04-01 LAB — PREPARE RBC (CROSSMATCH)

## 2023-04-01 LAB — MAGNESIUM: Magnesium: 1.9 mg/dL (ref 1.7–2.4)

## 2023-04-01 LAB — COMPREHENSIVE METABOLIC PANEL
ALT: 16 U/L (ref 0–44)
AST: 27 U/L (ref 15–41)
Albumin: 3.2 g/dL — ABNORMAL LOW (ref 3.5–5.0)
Alkaline Phosphatase: 34 U/L — ABNORMAL LOW (ref 38–126)
Anion gap: 10 (ref 5–15)
BUN: 28 mg/dL — ABNORMAL HIGH (ref 8–23)
CO2: 21 mmol/L — ABNORMAL LOW (ref 22–32)
Calcium: 8 mg/dL — ABNORMAL LOW (ref 8.9–10.3)
Chloride: 106 mmol/L (ref 98–111)
Creatinine, Ser: 1.03 mg/dL — ABNORMAL HIGH (ref 0.44–1.00)
GFR, Estimated: 57 mL/min — ABNORMAL LOW (ref >60)
Glucose, Bld: 131 mg/dL — ABNORMAL HIGH (ref 70–99)
Potassium: 4.7 mmol/L (ref 3.5–5.1)
Sodium: 137 mmol/L (ref 135–145)
Total Bilirubin: 0.6 mg/dL (ref 0.0–1.2)
Total Protein: 5.5 g/dL — ABNORMAL LOW (ref 6.5–8.1)

## 2023-04-01 LAB — I-STAT VENOUS BLOOD GAS, ED
Acid-base deficit: 2 mmol/L (ref 0.0–2.0)
Bicarbonate: 23.1 mmol/L (ref 20.0–28.0)
Calcium, Ion: 1.15 mmol/L (ref 1.15–1.40)
HCT: 17 % — ABNORMAL LOW (ref 36.0–46.0)
Hemoglobin: 5.8 g/dL — CL (ref 12.0–15.0)
O2 Saturation: 95 %
Potassium: 4.9 mmol/L (ref 3.5–5.1)
Sodium: 139 mmol/L (ref 135–145)
TCO2: 24 mmol/L (ref 22–32)
pCO2, Ven: 42.5 mm[Hg] — ABNORMAL LOW (ref 44–60)
pH, Ven: 7.343 (ref 7.25–7.43)
pO2, Ven: 82 mm[Hg] — ABNORMAL HIGH (ref 32–45)

## 2023-04-01 LAB — HEMOGLOBIN AND HEMATOCRIT, BLOOD
HCT: 18.4 % — ABNORMAL LOW (ref 36.0–46.0)
HCT: 30.3 % — ABNORMAL LOW (ref 36.0–46.0)
HCT: 30.1 % — ABNORMAL LOW (ref 36.0–46.0)
Hemoglobin: 5.8 g/dL — CL (ref 12.0–15.0)
Hemoglobin: 10.6 g/dL — ABNORMAL LOW (ref 12.0–15.0)
Hemoglobin: 10.4 g/dL — ABNORMAL LOW (ref 12.0–15.0)

## 2023-04-01 LAB — CK: Total CK: 803 U/L — ABNORMAL HIGH (ref 38–234)

## 2023-04-01 LAB — SURGICAL PCR SCREEN
MRSA, PCR: NEGATIVE
Staphylococcus aureus: NEGATIVE

## 2023-04-01 LAB — I-STAT CG4 LACTIC ACID, ED: Lactic Acid, Venous: 0.5 mmol/L (ref 0.5–1.9)

## 2023-04-01 LAB — VITAMIN B12: Vitamin B-12: 234 pg/mL (ref 180–914)

## 2023-04-01 MED ORDER — SODIUM CHLORIDE 0.9% IV SOLUTION: Freq: Once | INTRAVENOUS | Status: DC

## 2023-04-01 MED ORDER — CHLORHEXIDINE GLUCONATE CLOTH 2 % EX PADS
6.0000 | MEDICATED_PAD | Freq: Every day | CUTANEOUS | Status: DC
  Administered 2023-04-01 – 2023-04-06 (×6): 6 via TOPICAL

## 2023-04-01 MED ORDER — DIPHENHYDRAMINE HCL 50 MG/ML IJ SOLN: 25.0000 mg | Freq: Four times a day (QID) | INTRAMUSCULAR | Status: DC | PRN

## 2023-04-01 MED ORDER — CEFAZOLIN SODIUM-DEXTROSE 2-4 GM/100ML-% IV SOLN
2.0000 g | INTRAVENOUS | Status: DC
  Filled 2023-04-01: qty 100

## 2023-04-01 MED ORDER — POVIDONE-IODINE 10 % EX SWAB: 2.0000 | Freq: Once | CUTANEOUS | Status: DC

## 2023-04-01 MED ORDER — CHLORHEXIDINE GLUCONATE 4 % EX SOLN: 60.0000 mL | Freq: Once | CUTANEOUS | Status: DC

## 2023-04-01 NOTE — TOC CAGE-AID Note (Signed)
Transition of Care [facility]) - CAGE-AID Screening   Patient Details  Name: Sheena [name] [facility] MRN: [id number] Date of Birth: 30-Jun-1945  [facility] CM/SW Contact:    Leota Sauers, RN Phone Number: 04/01/2023, 6:00 AM   Clinical Narrative:  Patient denies the use of alcohol and illicit substances. Resources not given at this time.  CAGE-AID Screening:    Have You Ever Felt You Ought to Cut Down on Your Drinking or Drug Use?: No Have People Annoyed You By Critizing Your Drinking Or Drug Use?: No Have You Felt Bad Or Guilty About Your Drinking Or Drug Use?: No Have You Ever Had a Drink or Used Drugs First Thing In The Morning to Steady Your Nerves or to Get Rid of a Hangover?: No CAGE-AID Score: 0  Substance Abuse Education Offered: No

## 2023-04-01 NOTE — Progress Notes (Signed)
PROGRESS NOTE    Sheena [name] [facility]  BJY:[id number] DOB: [birth year] DOA: 03/31/2023 PCP: [name], MD  Subjective: Pt seen and examined. Met with pt's son Onalee Hua at bedside. Events of last night noted. Pt receiving her 2nd unit of PRBC Pain controlled. On RA. BP stable.   Hospital Course: HPI: 78 year old female history of lung cancer, hypertension, paroxysmal atrial fibrillation not on systemic anticoagulation due to history of GI bleeding, chronic low back pain presents the ER today after being found down in her apartment.  Patient states that yesterday evening, after dinner she threw away her paper plate into the garbage can.  She turned around and spun too quickly and fell.  She landed on the floor.  She was unable to get up.  She was found about 12 hours later by her daughter.  EMS was called.  Patient brought to the ER.  Right lower extremity externally rotated and shortened.  Pelvic x-ray shows acute right intertrochanteric fracture with varus angulation.  CK was elevated at 742, initial lactic acid was 5.4.  Patient does not complain of any abdominal pain.  Orthopedics consulted for hip fracture.  [facility] hospitalist consulted for admission.  Significant Events: Admitted 03/31/2023 admitted for right intertroch fracture with varus angulation.   Significant Labs: WBC 15.5, HgB 9.3, plt 188 BUN 36, scr 1.49 CK 742  Significant Imaging Studies: Pelvic XR Acute, comminuted intertrochanteric fracture of the proximal right femur with varus angulation. CT head/c-spine No evidence of acute intracranial abnormality or cervical spine fracture.  Antibiotic Therapy: Anti-infectives (From admission, onward)    None       Procedures:   Consultants: orthopedics    Assessment and Plan: * Closed intertrochanteric fracture of hip, right, initial encounter (HCC) On admission, NPO after MN. Ortho consulted. IV dilaudid prn for severe pain. Oxycodone prn for mod or  severe pain.  04-01-2023 continue with PRN IV dilaudid. Hopefully pt to OR today for ORIF.  Acute blood loss anemia 04-01-2023 likely due to bleeding at fracture site. No report of GI bleeding. Currently receiving 2nd unit of PRBC.   AKI (acute kidney injury) (HCC) On admission. Due to spending night on the floor in her apartment without any water. Continue with IVF. Repeat CMP in AM.  04-01-2023 Scr has improved with IVF. Scr 1.03 today. BUN 28. On IVF. Getting PRBC should also help.  Traumatic rhabdomyolysis (HCC) On admission. Likely due to fall and spending overnight on the floor. Trend CK. Elevated lactic acid due to dehydration and AKI.  04-01-2023 CK 803 today, was 742 yesterday. Not significant rise. Scr has improved with IVF.  DNR (do not resuscitate)/DNI(Do Not Intubate) On admission, I verified pt's desire to be a DNR/DNI. Pt informed that she would need to be a FULL CODE during surgery but could be changed back to DNR/DNI afterwards. Pt's dtr tabitha a witness to pt making herself a DNR/DNI. Pt is competent to make medical decisions.  04-01-2023 stable.  Macrocytic anemia 03-31-2023 noted to have folate deficiency in 10-2022. Repeat RBC folate. Start folic acid 1 mg daily.  Anemia Workup Lab Results  Component Value Date/Time   VITAMINB12 835 10/08/2022 05:39 AM   VITAMINB12 322 04/01/2011 10:57 AM   FOLATE 5.2 (L) 10/08/2022 05:39 AM   FOLATE 6.9 04/01/2011 10:57 AM   TIBC 266 10/08/2022 05:39 AM   IRON 245 (H) 10/08/2022 05:39 AM   04-01-2023 awaiting RBC folate results. On folic acid 1 mg daily.  PAF (paroxysmal atrial fibrillation) (HCC) -  not on systemic anticoagulation due to hx of GI bleeding On admission, telemetry shows NSR. Not on systemic anticoagulation due to hx of GI bleeding.  04-01-2023 stable.  Primary small cell carcinoma of lower lobe of left lung (HCC) On admission, pt follows with heme/onc. Dtr states pt has completed XRT and chemo.    04-01-2023 stable.  Hyperlipidemia On admission, hold statin due to rhabdo  04-01-2023 continue to hold statin.  Chronic low back pain On admission. Pt take bid ultram at home. Will be on prn oxycodone for now.  04-01-2023 stable.  Essential hypertension, benign On admission. Hold lasix. Consider restarting lopressor tomorrow. Prn labetalol for SBP >170 or DBP >100  04-01-2023 on reduced dose of lopressor 12.5 mg bid with hold orders.    DVT prophylaxis: SCDs Start: 03/31/23 1643    Code Status: Limited: Do not attempt resuscitation (DNR) -DNR-LIMITED -Do Not Intubate/DNI  Family Communication: discussed with pt and son Onalee Hua at bedside Disposition Plan: SNF vs home with home health. Reason for continuing need for hospitalization: still has not had ORIF yet.  Objective: Vitals:   04/01/23 1057 04/01/23 1136 04/01/23 1223 04/01/23 1242  BP: (!) 132/58 (!) 132/58 (!) 95/56 (!) 123/59  Pulse: 70 70 60 67  Resp: 18 16 17 17   Temp: 98.2 F (36.8 C) 98 F (36.7 C) 98.2 F (36.8 C) 97.9 F (36.6 C)  TempSrc: Oral  Oral Oral  SpO2:   97% 99%  Weight:      Height:        Intake/Output Summary (Last 24 hours) at 04/01/2023 1252 Last data filed at 04/01/2023 1057 Gross per 24 hour  Intake 667 ml  Output --  Net 667 ml   Filed Weights   03/31/23 1250  Weight: 39.5 kg    Examination:  Physical Exam Vitals and nursing note reviewed.  Constitutional:      Comments: Chronically ill appearing  HENT:     Head:     Comments: Dried blood that was over right eyebrow yesterday has been cleaned up. Only small laceration over right eyebrow. Does not need sutures. Eyes:     General: No scleral icterus. Cardiovascular:     Rate and Rhythm: Normal rate and regular rhythm.  Pulmonary:     Effort: Pulmonary effort is normal. No respiratory distress.     Breath sounds: Normal breath sounds. No wheezing.  Abdominal:     General: Bowel sounds are normal. There is no  distension.     Palpations: Abdomen is soft.  Musculoskeletal:     Comments: Right LE in buck's traction.  Skin:    General: Skin is warm and dry.     Capillary Refill: Capillary refill takes less than 2 seconds.  Neurological:     Mental Status: She is alert and oriented to person, place, and time.     Data Reviewed: I have personally reviewed following labs and imaging studies  CBC: Recent Labs  Lab 03/31/23 1252 03/31/23 1309 04/01/23 0424 04/01/23 0527 04/01/23 0543  WBC 15.5*  --  9.7  --   --   NEUTROABS  --   --  8.8*  --   --   HGB 9.3* 10.5* 6.3* 5.8* 5.8*  HCT 29.1* 31.0* 20.0* 18.4* 17.0*  MCV 106.6*  --  109.3*  --   --   PLT 188  --  107*  --   --    Basic Metabolic Panel: Recent Labs  Lab 03/31/23 1252 03/31/23 1309  04/01/23 0424 04/01/23 0543  NA 139 138 137 139  K 4.7 4.8 4.7 4.9  CL 101 103 106  --   CO2 20*  --  21*  --   GLUCOSE 177* 170* 131*  --   BUN 36* 48* 28*  --   CREATININE 1.49* 1.30* 1.03*  --   CALCIUM 9.8  --  8.0*  --   MG  --   --  1.9  --    GFR: Estimated Creatinine Clearance: 29.4 mL/min (A) (by C-G formula based on SCr of 1.03 mg/dL (H)). Liver Function Tests: Recent Labs  Lab 03/31/23 1252 04/01/23 0424  AST 42* 27  ALT 18 16  ALKPHOS 52 34*  BILITOT 0.5 0.6  PROT 7.3 5.5*  ALBUMIN 3.9 3.2*   Coagulation Profile: Recent Labs  Lab 03/31/23 1252  INR 1.0   Cardiac Enzymes: Recent Labs  Lab 03/31/23 1252 04/01/23 0424  CKTOTAL 742* 803*  CKMB 13.5*  --    BNP (last 3 results) Recent Labs    12/09/22 2152  BNP 254.3*   Anemia Panel: Recent Labs    04/01/23 0424  VITAMINB12 234   Sepsis Labs: Recent Labs  Lab 03/31/23 1310 04/01/23 0705  LATICACIDVEN 5.4* 0.5    Radiology Studies: DG Knee Right Port Result Date: 03/31/2023 CLINICAL DATA:  Fall.  Hip fracture. EXAM: PORTABLE RIGHT KNEE - 1-2 VIEW COMPARISON:  None Available. FINDINGS: There is diffuse osteopenia of the visualized osseous  structures. No acute fracture or dislocation. No aggressive osseous lesion. No significant degenerative changes. No knee effusion or focal soft tissue swelling. No radiopaque foreign bodies. IMPRESSION: *No acute osseous abnormality of the right knee. Electronically Signed   By: Jules Schick M.D.   On: 03/31/2023 16:38   CT Head Wo Contrast Result Date: 03/31/2023 CLINICAL DATA:  Head trauma, moderate-severe; Neck trauma (Age >= 65y). Fall last night. Forehead laceration. EXAM: CT HEAD WITHOUT CONTRAST CT CERVICAL SPINE WITHOUT CONTRAST TECHNIQUE: Multidetector CT imaging of the head and cervical spine was performed following the standard protocol without intravenous contrast. Multiplanar CT image reconstructions of the cervical spine were also generated. RADIATION DOSE REDUCTION: This exam was performed according to the departmental dose-optimization program which includes automated exposure control, adjustment of the mA and/or kV according to patient size and/or use of iterative reconstruction technique. COMPARISON:  Head MRI 01/30/2023 FINDINGS: CT HEAD FINDINGS Brain: There is no evidence of an acute infarct, intracranial hemorrhage, mass, midline shift, or extra-axial fluid collection. There is mild generalized cerebral atrophy. Cerebral white matter hypodensities are nonspecific but compatible with mild chronic small vessel ischemic disease. Vascular: Calcified atherosclerosis at the skull base. No hyperdense vessel. Skull: No acute fracture or destructive lesion. Sinuses/Orbits: The included paranasal sinuses and mastoid air cells are largely clear. Unremarkable orbits. Other: None. CT CERVICAL SPINE FINDINGS Alignment: Cervical spine straightening. Trace anterolisthesis of C7 on T1, likely degenerative. Skull base and vertebrae: No acute fracture or suspicious osseous lesion. Moderate atlantodental degenerative changes. Soft tissues and spinal canal: No prevertebral fluid or swelling. No visible canal  hematoma. Disc levels: Very mild disc degeneration for age. Up to moderate multilevel facet arthrosis. Upper chest: Emphysema. Other: Calcified atherosclerosis about the left greater than right carotid bifurcations. Partially visualized right internal jugular venous catheter. IMPRESSION: 1. No evidence of acute intracranial abnormality or cervical spine fracture. 2. Mild chronic small vessel ischemic disease. Electronically Signed   By: Sebastian Ache M.D.   On: 03/31/2023 14:34  CT Cervical Spine Wo Contrast Result Date: 03/31/2023 CLINICAL DATA:  Head trauma, moderate-severe; Neck trauma (Age >= 65y). Fall last night. Forehead laceration. EXAM: CT HEAD WITHOUT CONTRAST CT CERVICAL SPINE WITHOUT CONTRAST TECHNIQUE: Multidetector CT imaging of the head and cervical spine was performed following the standard protocol without intravenous contrast. Multiplanar CT image reconstructions of the cervical spine were also generated. RADIATION DOSE REDUCTION: This exam was performed according to the departmental dose-optimization program which includes automated exposure control, adjustment of the mA and/or kV according to patient size and/or use of iterative reconstruction technique. COMPARISON:  Head MRI 01/30/2023 FINDINGS: CT HEAD FINDINGS Brain: There is no evidence of an acute infarct, intracranial hemorrhage, mass, midline shift, or extra-axial fluid collection. There is mild generalized cerebral atrophy. Cerebral white matter hypodensities are nonspecific but compatible with mild chronic small vessel ischemic disease. Vascular: Calcified atherosclerosis at the skull base. No hyperdense vessel. Skull: No acute fracture or destructive lesion. Sinuses/Orbits: The included paranasal sinuses and mastoid air cells are largely clear. Unremarkable orbits. Other: None. CT CERVICAL SPINE FINDINGS Alignment: Cervical spine straightening. Trace anterolisthesis of C7 on T1, likely degenerative. Skull base and vertebrae: No acute  fracture or suspicious osseous lesion. Moderate atlantodental degenerative changes. Soft tissues and spinal canal: No prevertebral fluid or swelling. No visible canal hematoma. Disc levels: Very mild disc degeneration for age. Up to moderate multilevel facet arthrosis. Upper chest: Emphysema. Other: Calcified atherosclerosis about the left greater than right carotid bifurcations. Partially visualized right internal jugular venous catheter. IMPRESSION: 1. No evidence of acute intracranial abnormality or cervical spine fracture. 2. Mild chronic small vessel ischemic disease. Electronically Signed   By: Sebastian Ache M.D.   On: 03/31/2023 14:34   DG Pelvis Portable Result Date: 03/31/2023 CLINICAL DATA:  Fall, right hip pain EXAM: PORTABLE PELVIS 1-2 VIEWS COMPARISON:  12/09/2022 FINDINGS: Acute, comminuted intertrochanteric fracture of the proximal right femur with approximately 90 degrees of varus angulation. Hip joint intact without dislocation. Bony pelvis intact without fracture or diastasis. Soft tissue swelling is evident the lateral aspect of the right hip. IMPRESSION: Acute, comminuted intertrochanteric fracture of the proximal right femur with varus angulation. Electronically Signed   By: Duanne Guess D.O.   On: 03/31/2023 14:23   DG Chest Portable 1 View Result Date: 03/31/2023 CLINICAL DATA:  Fall and right hip fracture. EXAM: PORTABLE CHEST 1 VIEW COMPARISON:  Chest radiograph dated 12/20/2022. FINDINGS: Right-sided Port-A-Cath in similar position. The background of emphysema. No focal consolidation, pleural effusion, or pneumothorax. The cardiac silhouette is within normal limits. Atherosclerotic calcification of the aorta. Lower thoracic vertebroplasty. No acute osseous pathology. IMPRESSION: 1. No active disease. 2. Emphysema. Electronically Signed   By: Elgie Collard M.D.   On: 03/31/2023 14:22    Scheduled Meds:  sodium chloride   Intravenous Once   sodium chloride   Intravenous Once    amiodarone  200 mg Oral Daily   amLODipine  5 mg Oral Daily   chlorhexidine  60 mL Topical Once   Chlorhexidine Gluconate Cloth  6 each Topical Daily   fentaNYL (SUBLIMAZE) injection  12.5-50 mcg Intravenous Once   folic acid  1 mg Oral Daily   metoprolol tartrate  12.5 mg Oral BID   povidone-iodine  2 Application Topical Once   Continuous Infusions:  sodium chloride 125 mL/hr at 03/31/23 1458    ceFAZolin (ANCEF) IV       LOS: 1 day   Time spent: 40 minutes  Carollee Herter, DO  [facility]  04/01/2023, 12:52 PM

## 2023-04-01 NOTE — Progress Notes (Cosign Needed Addendum)
Orthopaedic Trauma Service   It was reported to this ortho trauma team that the patient at peanut butter and crackers around 1130 today despite:  1. Being on the OR schedule since yesterday  2. Having multiple staff members (ortho provider team, OR nurses) giving clear instructions to keep patient NPO.   There were epic chats sent to [place], the surgical team, but these were sent while we were in a lengthy case.  These were read and replied to after the case was completed. Nurse was instructed to keep patient NPO by myself   No attempts were made to call myself or Dr. Carola Frost, only epic chat   3. Having active NPO order since midnight   Pt was supposed to be first case this am but was moved back to the end of the day to allow for transfusion for critical hgb/hct  Due to her overall medical condition and frailty we have decided that it is to risky to proceed with surgery today due to food intake and surgery will be pushed back to tomorrow which will be roughly 48 hours since presentation to ED   Mearl Latin, PA-C [phone] (C) 04/01/2023, 5:03 PM  [facility] [address] [place] [address] [phone] (F)      Patient ID: Sheena [name] [facility], female   DOB: Mar 08, 1945, 78 y.o.   MRN: [id number]

## 2023-04-01 NOTE — Subjective & Objective (Addendum)
Pt seen and examined. Apparently pt ate while NPO yesterday necessitating cancellation of her surgery.  Pt had ORIF today. Pt seen on PACU. Had spinal anesthesia for ORIF.

## 2023-04-01 NOTE — Progress Notes (Addendum)
  Acute on chronic anemia: Patient's RN reported that CBC at 4:30 AM showed hemoglobin dropped 10.5 to 6.3 which is a significant drop.  CMP showed low bicarb, elevated creatinine 1.03 which is about the same since presentation in the ED.  Repeat CK around 823. Patient was admitted for management of closed intratrochanteric fracture of the right sided hip.  Patient underwent  for right femoral block for traction and planning for take to OR later sometime today for surgical repair.  Given patient hemoglobin has been dropped significantly repeating stat H&H and obtaining type and screen.  If repeat H&H shows significant drop of hemoglobin in that case  will do blood transfusion.  Per chart review patient also has history of paroxysmal atrial fibrillation however due to previous history of GI bleed patient never been on any systemic anticoagulation in the past. -Preparing 3 units of blood.  If patient's hemoglobin is actually low in that case will transfuse 2 units of blood. -On initial presentation patient lactic acid was 5.4.  Patient is afebrile, no leukocytosis.  Elevated lactic acid secondary from dehydration and rhabdomyolysis rather than an infectious source.  However continue to trend lactic acid to make sure patient is hydrating well. - Physical exam unremarkable for any hematoma formation and swelling of the right sided lower extremity around the hip joint.  Patient denies any noticing blood in her stool.  At this point unsure about sudden development of acute on chronic anemia.  Addendum: - Patient is hemoglobin is 5.8.  Transfusing 2 units of blood emergently.   Tereasa Coop, MD [facility] 04/01/2023, 5:33 AM

## 2023-04-01 NOTE — ED Notes (Signed)
Date and time results received: 04/01/23 0520 (use smartphrase ".now" to insert current time)  Test: hgb Critical Value: 6.3  Name of Provider Notified: S. Janalyn Shy, MD  Orders Received? Or Actions Taken?: repeat labs and report back

## 2023-04-01 NOTE — Progress Notes (Signed)
   04/01/23 1035  TOC Assessment  Living arrangements for the past 2 months Single Family Home  Lives with: Self  Current home services Home PT;Home RN;Home OT;Homehealth aide  Discharge Planning Services CM Consult  [facility]  Date [facility] Agency Contacted 04/01/23  Time [facility] Contacted [phone]  Representative spoke with at [facility] Tresa Endo  Do you feel safe going back to the place where you live? Y  Permission sought to share information with  Case Manager;Family Supports  Permission granted to share information with  Yes, Verbal Permission Granted  Appearance: Appears stated age  Attitude/Demeanor/Rapport Engaged;Ambitious  Orientation:  Oriented to Self;Oriented to Place;Oriented to  Time  Alcohol / Substance Use Tobacco Use   RNCM returned to pt bedside to discuss information obtained from daughter.  Son, Onalee Hua at bedside and both pt and son in agreement with waiting until after surgery and PT consult to make disposition plan but are open to both SNF and home health.

## 2023-04-01 NOTE — Anesthesia Preprocedure Evaluation (Addendum)
Anesthesia Evaluation  Patient identified by MRN, date of birth, ID band Patient awake    Reviewed: Allergy & Precautions, NPO status , Patient's Chart, lab work & pertinent test results, reviewed documented beta blocker date and time   History of Anesthesia Complications Negative for: history of anesthetic complications  Airway Mallampati: III  TM Distance: >3 FB     Dental  (+) Poor Dentition, Dental Advisory Given   Pulmonary shortness of breath and with exertion, former smoker Lung CA   breath sounds clear to auscultation + decreased breath sounds      Cardiovascular hypertension, Pt. on medications and Pt. on home beta blockers Normal cardiovascular exam+ dysrhythmias Atrial Fibrillation + Valvular Problems/Murmurs (mild AS) AS  Rhythm:Regular Rate:Normal  TTE 2024 1. Left ventricular ejection fraction, by estimation, is 60 to 65%. The  left ventricle has normal function. The left ventricle has no regional  wall motion abnormalities. Left ventricular diastolic parameters were  normal.   2. Right ventricular systolic function is normal. The right ventricular  size is normal.   3. The mitral valve is abnormal. No evidence of mitral valve  regurgitation. No evidence of mitral stenosis.   4. The aortic valve was not well visualized. There is mild calcification  of the aortic valve. There is mild thickening of the aortic valve. Aortic  valve regurgitation is trivial. Mild aortic valve stenosis.   5. The inferior vena cava is normal in size with greater than 50%  respiratory variability, suggesting right atrial pressure of 3 mmHg.     Neuro/Psych neg Seizures PSYCHIATRIC DISORDERS Anxiety      Neuromuscular disease    GI/Hepatic negative GI ROS, Neg liver ROS,,,  Endo/Other  negative endocrine ROS    Renal/GU Renal disease  negative genitourinary   Musculoskeletal  (+) Arthritis , Osteoarthritis,  Intertrochanteric  left hip Fx   Abdominal   Peds  Hematology  (+) Blood dyscrasia, anemia   Anesthesia Other Findings   Reproductive/Obstetrics                              Anesthesia Physical Anesthesia Plan  ASA: 3  Anesthesia Plan: Spinal   Post-op Pain Management: Tylenol PO (pre-op)*   Induction:   PONV Risk Score and Plan: 2 and Treatment may vary due to age or medical condition, Propofol infusion and Ondansetron  Airway Management Planned: Natural Airway  Additional Equipment: None  Intra-op Plan:   Post-operative Plan:   Informed Consent: I have reviewed the patients History and Physical, chart, labs and discussed the procedure including the risks, benefits and alternatives for the proposed anesthesia with the patient or authorized representative who has indicated his/her understanding and acceptance.     Dental advisory given  Plan Discussed with: CRNA and Anesthesiologist  Anesthesia Plan Comments: (Procedure cancelled for NPO status)         Anesthesia Quick Evaluation

## 2023-04-01 NOTE — Progress Notes (Signed)
Transition of Care [facility]) - Emergency Department Mini Assessment   Patient Details  Name: Sheena [name] [facility] MRN: [id number] Date of Birth: Aug 25, 1945  Transition [facility]) CM/SW Contact:    Oletta Cohn, RN Phone Number: 04/01/2023, 10:18 AM   Clinical Narrative: RNCM met with pt at bedside regarding disposition plan.  Pt requested that I speak with her daughter, [name] to coordinate discharge plan.  RNCM contacted Tabitha [phone]).  Tabitha states pt was previously active with [facility] for RN, NA, PT, and OT services (confirmed with Tresa Endo, liaison) and would like to resume services with them should that be her discharge plan.  [name] states that id her mom should need Skilled Nursing Facility first, that would be fine too as pt was walking independently prior to admission.  [name] states that pt's bed is on first level of home.  Pt has Rolling walker, BSC, wheelchair and shower seat at home.  RNCM confirmed with Tresa Endo that pt will need new home health orders to resume services prior to discharge home.   ED Mini Assessment: What brought you to the Emergency Department? : (P) Fall  Barriers to Discharge: (P) Continued Medical Work up  [employer] interventions: (P) gather collateral information to aid in discharge process  Means of departure: (P) Not know       Patient Contact and Communications Key Contact 1: (P) Tabitha   Spoke with: (P) Tabitha Contact Date: (P) 04/01/23,   Contact time: (P) 0945 Contact Phone Number: (P) [phone] Call outcome: (P) spoke with daughter  Patient states their goals for this hospitalization and ongoing recovery are:: (P) "fix my hip and go home."      Admission diagnosis:  Closed intertrochanteric fracture of hip, right, initial encounter [facility]) [S72.141A] Patient Active Problem List   Diagnosis Date Noted   Closed intertrochanteric fracture of hip, right, initial encounter (HCC) 03/31/2023   Traumatic  rhabdomyolysis (HCC) 03/31/2023   AKI (acute kidney injury) (HCC) 03/31/2023   Macrocytic anemia 03/31/2023   DNR (do not resuscitate)/DNI(Do Not Intubate) 03/31/2023   History of lung cancer 12/16/2022   Lumbar vertebral fracture (HCC) 12/10/2022   PAF (paroxysmal atrial fibrillation) (HCC) - not on systemic anticoagulation due to hx of GI bleeding 09/10/2022   Hypokalemia 09/10/2022   Pancytopenia (HCC) 09/07/2022   Port-A-Cath in place 08/11/2022   Primary small cell carcinoma of lower lobe of left lung (HCC) 07/08/2022   Adenopathy 06/30/2022   Aortic atherosclerosis (HCC) 01/18/2020   Grade II diastolic dysfunction 03/06/2015   Hyperlipidemia 03/05/2014   Former smoker 02/19/2014   Disequilibrium 02/19/2014   History of vertebral compression fracture 01/04/2013   Chronic low back pain 12/13/2012   Sinusitis, chronic 09/03/2012   Essential hypertension, benign 09/03/2012   Ataxia 04/01/2011   Anxiety 04/01/2011   Blurred vision 03/21/2010   Osteoporosis 03/21/2010   MURMUR 03/21/2010   Hyperglycemia 03/21/2010   PCP:  [name], MD Pharmacy:   [facility] [phone] Gin[place] Otto, [place] - [address] [place] [address] Phone: [phone] Fax: [phone]

## 2023-04-01 NOTE — Assessment & Plan Note (Addendum)
04-01-2023 likely due to bleeding at fracture site. No report of GI bleeding. Currently receiving 2nd unit of PRBC.   04-02-2023 s/p 2unit PRBC. Repeat HgB after 2 units PRBC transfusion 10.7

## 2023-04-02 ENCOUNTER — Inpatient Hospital Stay (HOSPITAL_COMMUNITY): Payer: 59

## 2023-04-02 ENCOUNTER — Encounter (HOSPITAL_COMMUNITY)
Admission: EM | Disposition: A | Discharge status: Skilled Nursing Facility | Payer: Self-pay | Source: Home / Self Care | Attending: Family Medicine

## 2023-04-02 ENCOUNTER — Inpatient Hospital Stay (HOSPITAL_COMMUNITY): Payer: 59 | Admitting: Anesthesiology

## 2023-04-02 ENCOUNTER — Other Ambulatory Visit: Payer: Self-pay

## 2023-04-02 DIAGNOSIS — I4891 Unspecified atrial fibrillation: Secondary | ICD-10-CM | POA: Diagnosis not present

## 2023-04-02 DIAGNOSIS — T796XXA Traumatic ischemia of muscle, initial encounter: Secondary | ICD-10-CM | POA: Diagnosis not present

## 2023-04-02 DIAGNOSIS — E785 Hyperlipidemia, unspecified: Secondary | ICD-10-CM | POA: Diagnosis not present

## 2023-04-02 DIAGNOSIS — I10 Essential (primary) hypertension: Secondary | ICD-10-CM | POA: Diagnosis not present

## 2023-04-02 DIAGNOSIS — N179 Acute kidney failure, unspecified: Secondary | ICD-10-CM | POA: Diagnosis not present

## 2023-04-02 DIAGNOSIS — S72141A Displaced intertrochanteric fracture of right femur, initial encounter for closed fracture: Secondary | ICD-10-CM | POA: Diagnosis not present

## 2023-04-02 DIAGNOSIS — D62 Acute posthemorrhagic anemia: Secondary | ICD-10-CM | POA: Diagnosis not present

## 2023-04-02 DIAGNOSIS — E875 Hyperkalemia: Secondary | ICD-10-CM | POA: Insufficient documentation

## 2023-04-02 HISTORY — PX: INTRAMEDULLARY (IM) NAIL INTERTROCHANTERIC: SHX5875

## 2023-04-02 LAB — COMPREHENSIVE METABOLIC PANEL
ALT: 18 U/L (ref 0–44)
AST: 28 U/L (ref 15–41)
Albumin: 3.1 g/dL — ABNORMAL LOW (ref 3.5–5.0)
Alkaline Phosphatase: 39 U/L (ref 38–126)
Anion gap: 9 (ref 5–15)
BUN: 29 mg/dL — ABNORMAL HIGH (ref 8–23)
CO2: 21 mmol/L — ABNORMAL LOW (ref 22–32)
Calcium: 8.3 mg/dL — ABNORMAL LOW (ref 8.9–10.3)
Chloride: 104 mmol/L (ref 98–111)
Creatinine, Ser: 1.02 mg/dL — ABNORMAL HIGH (ref 0.44–1.00)
GFR, Estimated: 57 mL/min — ABNORMAL LOW (ref >60)
Glucose, Bld: 108 mg/dL — ABNORMAL HIGH (ref 70–99)
Potassium: 5.2 mmol/L — ABNORMAL HIGH (ref 3.5–5.1)
Sodium: 134 mmol/L — ABNORMAL LOW (ref 135–145)
Total Bilirubin: 1.2 mg/dL (ref 0.0–1.2)
Total Protein: 5.8 g/dL — ABNORMAL LOW (ref 6.5–8.1)

## 2023-04-02 LAB — CBC
HCT: 29.3 % — ABNORMAL LOW (ref 36.0–46.0)
Hemoglobin: 9.8 g/dL — ABNORMAL LOW (ref 12.0–15.0)
MCH: 33.2 pg (ref 26.0–34.0)
MCHC: 33.4 g/dL (ref 30.0–36.0)
MCV: 99.3 fL (ref 80.0–100.0)
Platelets: 92 10*3/uL — ABNORMAL LOW (ref 150–400)
RBC: 2.95 MIL/uL — ABNORMAL LOW (ref 3.87–5.11)
RDW: 18.5 % — ABNORMAL HIGH (ref 11.5–15.5)
WBC: 10 10*3/uL (ref 4.0–10.5)
nRBC: 0 % (ref 0.0–0.2)

## 2023-04-02 LAB — CBC WITH DIFFERENTIAL/PLATELET
Abs Immature Granulocytes: 0.07 10*3/uL (ref 0.00–0.07)
Basophils Absolute: 0 10*3/uL (ref 0.0–0.1)
Basophils Relative: 0 %
Eosinophils Absolute: 0 10*3/uL (ref 0.0–0.5)
Eosinophils Relative: 0 %
HCT: 31.1 % — ABNORMAL LOW (ref 36.0–46.0)
Hemoglobin: 10.7 g/dL — ABNORMAL LOW (ref 12.0–15.0)
Immature Granulocytes: 1 %
Lymphocytes Relative: 7 %
Lymphs Abs: 0.7 10*3/uL (ref 0.7–4.0)
MCH: 33.2 pg (ref 26.0–34.0)
MCHC: 34.4 g/dL (ref 30.0–36.0)
MCV: 96.6 fL (ref 80.0–100.0)
Monocytes Absolute: 1.2 10*3/uL — ABNORMAL HIGH (ref 0.1–1.0)
Monocytes Relative: 11 %
Neutro Abs: 9.1 10*3/uL — ABNORMAL HIGH (ref 1.7–7.7)
Neutrophils Relative %: 81 %
Platelets: 102 10*3/uL — ABNORMAL LOW (ref 150–400)
RBC: 3.22 MIL/uL — ABNORMAL LOW (ref 3.87–5.11)
RDW: 18.9 % — ABNORMAL HIGH (ref 11.5–15.5)
WBC: 11.1 10*3/uL — ABNORMAL HIGH (ref 4.0–10.5)
nRBC: 0 % (ref 0.0–0.2)

## 2023-04-02 LAB — POCT I-STAT, CHEM 8
BUN: 29 mg/dL — ABNORMAL HIGH (ref 8–23)
Calcium, Ion: 1.14 mmol/L — ABNORMAL LOW (ref 1.15–1.40)
Chloride: 105 mmol/L (ref 98–111)
Creatinine, Ser: 1 mg/dL (ref 0.44–1.00)
Glucose, Bld: 95 mg/dL (ref 70–99)
HCT: 32 % — ABNORMAL LOW (ref 36.0–46.0)
Hemoglobin: 10.9 g/dL — ABNORMAL LOW (ref 12.0–15.0)
Potassium: 4.8 mmol/L (ref 3.5–5.1)
Sodium: 137 mmol/L (ref 135–145)
TCO2: 24 mmol/L (ref 22–32)

## 2023-04-02 LAB — CK: Total CK: 467 U/L — ABNORMAL HIGH (ref 38–234)

## 2023-04-02 SURGERY — FIXATION, FRACTURE, INTERTROCHANTERIC, WITH INTRAMEDULLARY ROD
Anesthesia: Spinal | Laterality: Right

## 2023-04-02 MED ORDER — CEFAZOLIN SODIUM 1 G IJ SOLR
INTRAMUSCULAR | Status: AC
  Filled 2023-04-02: qty 40

## 2023-04-02 MED ORDER — PROPOFOL 10 MG/ML IV BOLUS
INTRAVENOUS | Status: DC | PRN
  Administered 2023-04-02: 30 mg via INTRAVENOUS

## 2023-04-02 MED ORDER — FENTANYL CITRATE (PF) 250 MCG/5ML IJ SOLN
INTRAMUSCULAR | Status: AC
  Filled 2023-04-02: qty 5

## 2023-04-02 MED ORDER — HYDROMORPHONE HCL 1 MG/ML IJ SOLN
0.5000 mg | INTRAMUSCULAR | Status: DC | PRN
  Administered 2023-04-02 – 2023-04-06 (×10): 0.5 mg via INTRAVENOUS
  Filled 2023-04-02 (×11): qty 0.5

## 2023-04-02 MED ORDER — FENTANYL CITRATE (PF) 250 MCG/5ML IJ SOLN
INTRAMUSCULAR | Status: DC | PRN
  Administered 2023-04-02: 50 ug via INTRAVENOUS

## 2023-04-02 MED ORDER — ORAL CARE MOUTH RINSE: 15.0000 mL | Freq: Once | OROMUCOSAL | Status: AC

## 2023-04-02 MED ORDER — SODIUM CHLORIDE 0.9 % IV SOLN: INTRAVENOUS | Status: DC

## 2023-04-02 MED ORDER — CHLORHEXIDINE GLUCONATE 0.12 % MT SOLN: 15.0000 mL | Freq: Once | OROMUCOSAL | Status: AC

## 2023-04-02 MED ORDER — ACETAMINOPHEN 325 MG PO TABS
650.0000 mg | ORAL_TABLET | Freq: Four times a day (QID) | ORAL | Status: DC
  Administered 2023-04-02 – 2023-04-06 (×16): 650 mg via ORAL
  Filled 2023-04-02 (×15): qty 2

## 2023-04-02 MED ORDER — ADULT MULTIVITAMIN W/MINERALS CH
1.0000 | ORAL_TABLET | Freq: Every day | ORAL | Status: DC
  Administered 2023-04-02 – 2023-04-03 (×2): 1
  Filled 2023-04-02 (×2): qty 1

## 2023-04-02 MED ORDER — EPHEDRINE 5 MG/ML INJ
INTRAVENOUS | Status: AC
  Filled 2023-04-02: qty 5

## 2023-04-02 MED ORDER — SODIUM CHLORIDE (PF) 0.9 % IJ SOLN
INTRAMUSCULAR | Status: AC
  Filled 2023-04-02: qty 10

## 2023-04-02 MED ORDER — ROCURONIUM BROMIDE 10 MG/ML (PF) SYRINGE
PREFILLED_SYRINGE | INTRAVENOUS | Status: AC
  Filled 2023-04-02: qty 10

## 2023-04-02 MED ORDER — PHENYLEPHRINE 80 MCG/ML (10ML) SYRINGE FOR IV PUSH (FOR BLOOD PRESSURE SUPPORT)
PREFILLED_SYRINGE | INTRAVENOUS | Status: AC
  Filled 2023-04-02: qty 10

## 2023-04-02 MED ORDER — FENTANYL CITRATE (PF) 100 MCG/2ML IJ SOLN: 25.0000 ug | INTRAMUSCULAR | Status: DC | PRN

## 2023-04-02 MED ORDER — DEXAMETHASONE SODIUM PHOSPHATE 10 MG/ML IJ SOLN
INTRAMUSCULAR | Status: DC | PRN
  Administered 2023-04-02: 4 mg via INTRAVENOUS

## 2023-04-02 MED ORDER — PROPOFOL 500 MG/50ML IV EMUL
INTRAVENOUS | Status: DC | PRN
  Administered 2023-04-02: 25 ug/kg/min via INTRAVENOUS

## 2023-04-02 MED ORDER — ASPIRIN 81 MG PO TBEC
81.0000 mg | DELAYED_RELEASE_TABLET | Freq: Every day | ORAL | Status: DC
  Administered 2023-04-03 – 2023-04-06 (×4): 81 mg via ORAL
  Filled 2023-04-02 (×4): qty 1

## 2023-04-02 MED ORDER — LIDOCAINE 2% (20 MG/ML) 5 ML SYRINGE
INTRAMUSCULAR | Status: AC
  Filled 2023-04-02: qty 5

## 2023-04-02 MED ORDER — ACETAMINOPHEN 500 MG PO TABS
1000.0000 mg | ORAL_TABLET | Freq: Once | ORAL | Status: AC
Start: 2023-04-02 — End: 2023-04-02
  Administered 2023-04-02: 1000 mg via ORAL
  Filled 2023-04-02: qty 2

## 2023-04-02 MED ORDER — TRANEXAMIC ACID-NACL 1000-0.7 MG/100ML-% IV SOLN
1000.0000 mg | Freq: Once | INTRAVENOUS | Status: AC
  Administered 2023-04-02: 1000 mg via INTRAVENOUS
  Filled 2023-04-02: qty 100

## 2023-04-02 MED ORDER — HYDRALAZINE HCL 10 MG PO TABS: 10.0000 mg | ORAL_TABLET | Freq: Four times a day (QID) | ORAL | Status: DC | PRN

## 2023-04-02 MED ORDER — POLYETHYLENE GLYCOL 3350 17 G PO PACK: 17.0000 g | PACK | Freq: Every day | ORAL | Status: DC | PRN

## 2023-04-02 MED ORDER — PHENYLEPHRINE 80 MCG/ML (10ML) SYRINGE FOR IV PUSH (FOR BLOOD PRESSURE SUPPORT)
PREFILLED_SYRINGE | INTRAVENOUS | Status: DC | PRN
  Administered 2023-04-02: 80 ug via INTRAVENOUS
  Administered 2023-04-02 (×3): 160 ug via INTRAVENOUS

## 2023-04-02 MED ORDER — ALBUMIN HUMAN 5 % IV SOLN: 12.5000 g | Freq: Once | INTRAVENOUS | Status: AC

## 2023-04-02 MED ORDER — CHLORHEXIDINE GLUCONATE 0.12 % MT SOLN
OROMUCOSAL | Status: AC
  Administered 2023-04-02: 15 mL via OROMUCOSAL
  Filled 2023-04-02: qty 15

## 2023-04-02 MED ORDER — OXYCODONE HCL 5 MG PO TABS
5.0000 mg | ORAL_TABLET | ORAL | Status: DC | PRN
  Administered 2023-04-02 – 2023-04-06 (×12): 5 mg via ORAL
  Filled 2023-04-02 (×12): qty 1

## 2023-04-02 MED ORDER — ENSURE ENLIVE PO LIQD
237.0000 mL | Freq: Two times a day (BID) | ORAL | Status: DC
  Administered 2023-04-02 – 2023-04-06 (×7): 237 mL via ORAL

## 2023-04-02 MED ORDER — LACTATED RINGERS IV SOLN: INTRAVENOUS | Status: DC

## 2023-04-02 MED ORDER — PROPOFOL 10 MG/ML IV BOLUS
INTRAVENOUS | Status: AC
  Filled 2023-04-02: qty 20

## 2023-04-02 MED ORDER — ALBUMIN HUMAN 5 % IV SOLN
INTRAVENOUS | Status: AC
  Administered 2023-04-02: 12.5 g via INTRAVENOUS
  Filled 2023-04-02: qty 250

## 2023-04-02 MED ORDER — CEFAZOLIN SODIUM-DEXTROSE 2-4 GM/100ML-% IV SOLN
2.0000 g | Freq: Three times a day (TID) | INTRAVENOUS | Status: AC
  Administered 2023-04-02 – 2023-04-03 (×3): 2 g via INTRAVENOUS
  Filled 2023-04-02 (×3): qty 100

## 2023-04-02 MED ORDER — DEXAMETHASONE SODIUM PHOSPHATE 10 MG/ML IJ SOLN
INTRAMUSCULAR | Status: AC
  Filled 2023-04-02: qty 1

## 2023-04-02 MED ORDER — DOCUSATE SODIUM 100 MG PO CAPS
100.0000 mg | ORAL_CAPSULE | Freq: Two times a day (BID) | ORAL | Status: DC
  Administered 2023-04-02 – 2023-04-06 (×8): 100 mg via ORAL
  Filled 2023-04-02 (×8): qty 1

## 2023-04-02 MED ORDER — CEFAZOLIN SODIUM-DEXTROSE 2-3 GM-%(50ML) IV SOLR
INTRAVENOUS | Status: DC | PRN
  Administered 2023-04-02: 2 g via INTRAVENOUS

## 2023-04-02 MED ORDER — PHENYLEPHRINE HCL-NACL 20-0.9 MG/250ML-% IV SOLN
INTRAVENOUS | Status: DC | PRN
  Administered 2023-04-02: 25 ug/min via INTRAVENOUS

## 2023-04-02 MED ORDER — ONDANSETRON HCL 4 MG/2ML IJ SOLN
INTRAMUSCULAR | Status: DC | PRN
  Administered 2023-04-02: 4 mg via INTRAVENOUS

## 2023-04-02 MED ORDER — ONDANSETRON HCL 4 MG/2ML IJ SOLN
INTRAMUSCULAR | Status: AC
  Filled 2023-04-02: qty 2

## 2023-04-02 MED ORDER — 0.9 % SODIUM CHLORIDE (POUR BTL) OPTIME
TOPICAL | Status: DC | PRN
  Administered 2023-04-02: 1000 mL

## 2023-04-02 MED ORDER — BUPIVACAINE IN DEXTROSE 0.75-8.25 % IT SOLN
INTRATHECAL | Status: DC | PRN
  Administered 2023-04-02: 1.6 mL via INTRATHECAL

## 2023-04-02 SURGICAL SUPPLY — 39 items
BAG COUNTER SPONGE SURGICOUNT (BAG) ×1 IMPLANT
BIT DRILL INTERTAN LAG SCREW (BIT) IMPLANT
BIT DRILL LONG 4.0 (BIT) IMPLANT
BRUSH SCRUB EZ PLAIN DRY (MISCELLANEOUS) ×2 IMPLANT
COVER PERINEAL POST (MISCELLANEOUS) ×1 IMPLANT
COVER SURGICAL LIGHT HANDLE (MISCELLANEOUS) ×2 IMPLANT
DERMABOND ADVANCED .7 DNX6 (GAUZE/BANDAGES/DRESSINGS) IMPLANT
DRAPE C-ARM 42X72 X-RAY (DRAPES) ×1 IMPLANT
DRAPE C-ARMOR (DRAPES) ×1 IMPLANT
DRAPE IMP U-DRAPE 54X76 (DRAPES) IMPLANT
DRAPE INCISE IOBAN 66X45 STRL (DRAPES) ×1 IMPLANT
DRAPE U-SHAPE 47X51 STRL (DRAPES) ×1 IMPLANT
DRESSING MEPILEX FLEX 4X4 (GAUZE/BANDAGES/DRESSINGS) ×1 IMPLANT
DRILL BIT LONG 4.0 (BIT) ×1 IMPLANT
DRSG MEPILEX FLEX 4X4 (GAUZE/BANDAGES/DRESSINGS) ×1 IMPLANT
DRSG MEPILEX POST OP 4X8 (GAUZE/BANDAGES/DRESSINGS) ×1 IMPLANT
ELECT REM PT RETURN 9FT ADLT (ELECTROSURGICAL) ×1 IMPLANT
ELECTRODE REM PT RTRN 9FT ADLT (ELECTROSURGICAL) ×1 IMPLANT
GLOVE BIO SURGEON STRL SZ7.5 (GLOVE) ×1 IMPLANT
GLOVE BIO SURGEON STRL SZ8 (GLOVE) ×1 IMPLANT
GLOVE BIOGEL PI IND STRL 7.5 (GLOVE) ×1 IMPLANT
GLOVE BIOGEL PI IND STRL 8 (GLOVE) ×1 IMPLANT
GLOVE SURG ORTHO LTX SZ7.5 (GLOVE) ×2 IMPLANT
GOWN STRL REUS W/ TWL LRG LVL3 (GOWN DISPOSABLE) ×2 IMPLANT
GOWN STRL REUS W/ TWL XL LVL3 (GOWN DISPOSABLE) ×1 IMPLANT
GUIDE PIN 3.2X343 (PIN) ×2 IMPLANT
KIT BASIN OR (CUSTOM PROCEDURE TRAY) ×1 IMPLANT
KIT TURNOVER KIT B (KITS) ×1 IMPLANT
NAIL INTERTAN 10X18 130D 10S (Nail) IMPLANT
NS IRRIG 1000ML POUR BTL (IV SOLUTION) ×1 IMPLANT
PACK GENERAL/GYN (CUSTOM PROCEDURE TRAY) ×1 IMPLANT
PAD ARMBOARD 7.5X6 YLW CONV (MISCELLANEOUS) ×2 IMPLANT
PIN GUIDE 3.2X343MM (PIN) IMPLANT
SCREW LAG COMPR KIT 90/85 (Screw) IMPLANT
SCREW TRIGEN LOW PROF 5.0X30 (Screw) IMPLANT
SUT MNCRL AB 0 CT1 27 (SUTURE) IMPLANT
TOWEL GREEN STERILE (TOWEL DISPOSABLE) ×2 IMPLANT
TOWEL GREEN STERILE FF (TOWEL DISPOSABLE) ×1 IMPLANT
WATER STERILE IRR 1000ML POUR (IV SOLUTION) ×1 IMPLANT

## 2023-04-02 NOTE — Op Note (Signed)
Orthopaedic Surgery Operative Note (CSN: [id number] ) Date of Surgery: 04/02/2023  Admit Date: 03/31/2023   Diagnoses: Pre-Op Diagnoses: Right intertrochanteric femur fracture  Post-Op Diagnosis: Same  Procedures: CPT 27245-Cephalomedullary nailing of right intertrochanteric femur fracture  Surgeons : Primary: Roby Lofts, MD  Assistant: Ulyses Southward, PA-C  Location: OR 3   Anesthesia: Spinal  Antibiotics: Ancef 2g preop   Tourniquet time: None    Estimated Blood Loss: 25 mL  Complications:* No complications entered in OR log *   Specimens:* No specimens in log *   Implants: Implant Name Type Inv. Item Serial No. Manufacturer Lot No. LRB No. Used Action  NAIL INTERTAN 10X18 130D 10S - [id number] Nail NAIL INTERTAN 10X18 130D 10S  [employer] [id number] Right 1 Implanted  SCREW LAG COMPR KIT 90/85 - [id number] Screw SCREW LAG COMPR KIT 90/85  [employer] [id number] Right 1 Implanted  SCREW TRIGEN LOW PROF 5.0X30 - [id number] Screw SCREW TRIGEN LOW PROF 5.0X30  [employer] [id number] Right 1 Implanted     Indications for Surgery: 78 year old female who sustained a ground-level fall with a right intertrochanteric femur fracture.  Due to the unstable nature of her injury I recommended proceeding with cephalomedullary nailing of the right hip.  Risks and benefits were discussed with the patient and her daughter.  Risks included but not limited to bleeding, infection, malunion, nonunion, hardware failure, hardware irritation, nerve and blood vessel injury,, DVT, even the possibility anesthetic complications.  She agreed to proceed with surgery and consent was obtained  Operative Findings: Cephalomedullary nailing of right intertrochanteric femur fracture using [employer] InterTAN 10 x 180 mm nail with a 90 mm lag screw and a 5 mm compression screw.  Procedure: The patient was identified in the preoperative holding area.  Consent was confirmed with the patient and their family and all questions were answered. The operative extremity was marked after confirmation with the patient. she was then brought back to the operating room by our anesthesia colleagues.  She was placed under spinal anesthetic.  She was carefully transferred over to a Hana table.  All bony prominences were well-padded.  Traction was applied to the right lower extremity and fluoroscopic imaging showed adequate alignment.  The right lower extremity was then prepped and draped in usual sterile fashion.  A timeout was performed to verify the patient, the procedure, and the extremity.  Preoperative antibiotics were dosed.  Small incision proximal to the greater trochanter was made and carried down through skin and subcutaneous tissue.  Threaded guidewire was placed at the tip of the greater trochanter and advanced into the proximal metaphysis.  I confirmed positioning of the guidewire and then used an entry reamer to enter the medullary canal.  I then passed a 10 x 180 mm nail down the center of the canal and seated until it was appropriately positioned.  I then directed a threaded guidewire using the targeting arm into the head/neck segment.  I confirmed tip apex distance using fluoroscopic imaging.  I then measured the length and chose to use a 90 mm lag screw.  I drilled the path for the compression screw and placed an antirotation bar.  I then drilled the path from the lag screw and placed a 90 mm lag screw.  I then placed a compression screw and compressed proximally 5 mm.  I statically locked the proximal portion of the nail.  I then used the targeting arm and drilled and placed  a distal interlocking screw from lateral to medial.  The targeting arm was removed and final fluoroscopic imaging was obtained.  The incisions were copiously irrigated and a layered closure of 2-0 Monocryl and Dermabond was used to close the skin.  Sterile dressings were applied.  The  patient was then awoke from anesthesia and taken to the PACU in stable condition.  Post Op Plan/Instructions: Patient be weightbearing as tolerated to the right lower extremity.  She will receive postoperative Ancef.  She will be restarted on her anticoagulation tomorrow if her hemoglobin is stabilized.  We will have her mobilize with physical and Occupational Therapy.  I was present and performed the entire surgery.  Ulyses Southward, PA-C did assist me throughout the case. An assistant was necessary given the difficulty in approach, maintenance of reduction and ability to instrument the fracture.   Truitt Merle, MD [facility]

## 2023-04-02 NOTE — Anesthesia Procedure Notes (Signed)
Spinal  Patient location during procedure: OR Start time: 04/02/2023 12:23 PM End time: 04/02/2023 12:06 PM Reason for block: surgical anesthesia Staffing Performed: anesthesiologist  Anesthesiologist: Mal Amabile, MD Performed by: Mal Amabile, MD Authorized by: Mal Amabile, MD   Preanesthetic Checklist Completed: patient identified, IV checked, site marked, risks and benefits discussed, surgical consent, monitors and equipment checked, pre-op evaluation and timeout performed Spinal Block Patient position: right lateral decubitus Prep: DuraPrep and site prepped and draped Patient monitoring: heart rate, cardiac monitor, continuous pulse ox and blood pressure Approach: midline Location: L3-4 Injection technique: single-shot Needle Needle type: Pencan  Needle gauge: 24 G Needle length: 9 cm Needle insertion depth: 5 cm Assessment Sensory level: T6 Events: CSF return Additional Notes Patient tolerated procedure well. Adequate sensory level.

## 2023-04-02 NOTE — Progress Notes (Addendum)
PROGRESS NOTE    Sheena [name] [facility]  ZOX:[id number] DOB: November 09, 1945 DOA: 03/31/2023 PCP: [name], MD  Subjective: Pt seen and examined. Apparently pt ate while NPO yesterday necessitating cancellation of her surgery.  Pt had ORIF today. Pt seen on PACU. Had spinal anesthesia for ORIF.    Hospital Course: HPI: 78 year old female history of lung cancer, hypertension, paroxysmal atrial fibrillation not on systemic anticoagulation due to history of GI bleeding, chronic low back pain presents the ER today after being found down in her apartment.  Patient states that yesterday evening, after dinner she threw away her paper plate into the garbage can.  She turned around and spun too quickly and fell.  She landed on the floor.  She was unable to get up.  She was found about 12 hours later by her daughter.  EMS was called.  Patient brought to the ER.  Right lower extremity externally rotated and shortened.  Pelvic x-ray shows acute right intertrochanteric fracture with varus angulation.  CK was elevated at 742, initial lactic acid was 5.4.  Patient does not complain of any abdominal pain.  Orthopedics consulted for hip fracture.  [facility] hospitalist consulted for admission.  Significant Events: Admitted 03/31/2023 admitted for right intertroch fracture with varus angulation.   Significant Labs: WBC 15.5, HgB 9.3, plt 188 BUN 36, scr 1.49 CK 742  Significant Imaging Studies: Pelvic XR Acute, comminuted intertrochanteric fracture of the proximal right femur with varus angulation. CT head/c-spine No evidence of acute intracranial abnormality or cervical spine fracture.  Antibiotic Therapy: Anti-infectives (From admission, onward)    None       Procedures:   Consultants: orthopedics    Assessment and Plan: * Closed intertrochanteric fracture of hip, right, initial encounter (HCC) - s/p ORIF 04-02-2023 with IM nail On admission, NPO after MN. Ortho consulted. IV  dilaudid prn for severe pain. Oxycodone prn for mod or severe pain. 04-01-2023 continue with PRN IV dilaudid. Hopefully pt to OR today for ORIF.  04-02-2023 s/p ORIF today. PT/OT assessment tomorrow. Pt and family to decide if they want SNF placement vs DC to home with home health PT.  Acute blood loss anemia 04-01-2023 likely due to bleeding at fracture site. No report of GI bleeding. Currently receiving 2nd unit of PRBC.   04-02-2023 s/p 2unit PRBC. Repeat HgB after 2 units PRBC transfusion 10.7  AKI (acute kidney injury) (HCC) On admission. Due to spending night on the floor in her apartment without any water. Continue with IVF. Repeat CMP in AM. 04-01-2023 Scr has improved with IVF. Scr 1.03 today. BUN 28. On IVF. Getting PRBC should also help.  04-02-2023 Scr stable at 1.02, BUN 29 today.  Traumatic rhabdomyolysis (HCC) On admission. Likely due to fall and spending overnight on the floor. Trend CK. Elevated lactic acid due to dehydration and AKI. 04-01-2023 CK 803 today, was 742 yesterday. Not significant rise. Scr has improved with IVF.  04-02-2023 CK 467 today. Can stop checking CK. Scr stable at 1.02  DNR (do not resuscitate)/DNI(Do Not Intubate) On admission, I verified pt's desire to be a DNR/DNI. Pt informed that she would need to be a FULL CODE during surgery but could be changed back to DNR/DNI afterwards. Pt's dtr tabitha a witness to pt making herself a DNR/DNI. Pt is competent to make medical decisions.  04-01-2023 stable.  Macrocytic anemia 03-31-2023 noted to have folate deficiency in 10-2022. Repeat RBC folate. Start folic acid 1 mg daily.  Anemia Workup Lab Results  Component Value Date/Time   VITAMINB12 835 10/08/2022 05:39 AM   VITAMINB12 322 04/01/2011 10:57 AM   FOLATE 5.2 (L) 10/08/2022 05:39 AM   FOLATE 6.9 04/01/2011 10:57 AM   TIBC 266 10/08/2022 05:39 AM   IRON 245 (H) 10/08/2022 05:39 AM   04-01-2023 awaiting RBC folate results. On folic acid 1 mg  daily.  04-02-2023 still awaiting RBC folate results.  PAF (paroxysmal atrial fibrillation) (HCC) - not on systemic anticoagulation due to hx of GI bleeding On admission, telemetry shows NSR. Not on systemic anticoagulation due to hx of GI bleeding. 04-01-2023 stable.  04-02-2023 stable  Primary small cell carcinoma of lower lobe of left lung (HCC) On admission, pt follows with heme/onc. Dtr states pt has completed XRT and chemo.  04-01-2023 stable.  04-02-2023 stable  Hyperlipidemia On admission, hold statin due to rhabdo 04-01-2023 continue to hold statin.  04-02-2023 restart statin at discharge  Chronic low back pain On admission. Pt take bid ultram at home. Will be on prn oxycodone for now.  04-01-2023 stable.  Essential hypertension, benign On admission. Hold lasix. Consider restarting lopressor tomorrow. Prn labetalol for SBP >170 or DBP >100 04-01-2023 on reduced dose of lopressor 12.5 mg bid with hold orders.   04-02-2023 BP meds have hold orders attached to them.  Hyperkalemia 04-02-2023 repeat K in AM.   DVT prophylaxis: SCDs Start: 03/31/23 1643    Code Status: Limited: Do not attempt resuscitation (DNR) -DNR-LIMITED -Do Not Intubate/DNI  Family Communication: no family at bedside Disposition Plan: SNF vs home with home health Reason for continuing need for hospitalization: just had ORIF today.  Objective: Vitals:   04/02/23 1325 04/02/23 1330 04/02/23 1345 04/02/23 1400  BP: (!) 108/90 (!) 101/47 (!) 103/45 115/66  Pulse: (!) 53 (!) 53 (!) 54 (!) 55  Resp:  11 13 13   Temp:  97.7 F (36.5 C)    TempSrc:      SpO2: 100% 100% 97% 99%  Weight:      Height:        Intake/Output Summary (Last 24 hours) at 04/02/2023 1403 Last data filed at 04/02/2023 1329 Gross per 24 hour  Intake 500 ml  Output 25 ml  Net 475 ml   Filed Weights   03/31/23 1250  Weight: 39.5 kg    Examination:  Physical Exam Vitals and nursing note reviewed.   Constitutional:      General: She is not in acute distress.    Appearance: She is not toxic-appearing or diaphoretic.     Comments: Chronically ill appearing  HENT:     Head: Normocephalic and atraumatic.  Cardiovascular:     Rate and Rhythm: Normal rate and regular rhythm.  Pulmonary:     Effort: Pulmonary effort is normal.     Breath sounds: Normal breath sounds.  Abdominal:     General: Bowel sounds are normal.     Palpations: Abdomen is soft.  Skin:    General: Skin is warm and dry.     Capillary Refill: Capillary refill takes less than 2 seconds.  Neurological:     Mental Status: She is oriented to person, place, and time.     Comments: Unable to feel me touching her feet. Pt had spinal anesthesia     Data Reviewed: I have personally reviewed following labs and imaging studies  CBC: Recent Labs  Lab 03/31/23 1252 03/31/23 1309 04/01/23 0424 04/01/23 0527 04/01/23 1508 04/01/23 1614 04/01/23 1756 04/02/23 0558 04/02/23 1030  WBC 15.5*  --  9.7  --   --   --   --  11.1*  --   NEUTROABS  --   --  8.8*  --   --   --   --  9.1*  --   HGB 9.3*   < > 6.3*   < > 11.9* 10.6* 10.4* 10.7* 10.9*  HCT 29.1*   < > 20.0*   < > 35.0* 30.3* 30.1* 31.1* 32.0*  MCV 106.6*  --  109.3*  --   --   --   --  96.6  --   PLT 188  --  107*  --   --   --   --  102*  --    < > = values in this interval not displayed.   Basic Metabolic Panel: Recent Labs  Lab 03/31/23 1252 03/31/23 1309 04/01/23 0424 04/01/23 0543 04/01/23 1508 04/02/23 0558 04/02/23 1030  NA 139 138 137 139 138 134* 137  K 4.7 4.8 4.7 4.9 5.4* 5.2* 4.8  CL 101 103 106  --  106 104 105  CO2 20*  --  21*  --   --  21*  --   GLUCOSE 177* 170* 131*  --  108* 108* 95  BUN 36* 48* 28*  --  31* 29* 29*  CREATININE 1.49* 1.30* 1.03*  --  1.00 1.02* 1.00  CALCIUM 9.8  --  8.0*  --   --  8.3*  --   MG  --   --  1.9  --   --   --   --    GFR: Estimated Creatinine Clearance: 30.3 mL/min (by C-G formula based on SCr of  1 mg/dL). Liver Function Tests: Recent Labs  Lab 03/31/23 1252 04/01/23 0424 04/02/23 0558  AST 42* 27 28  ALT 18 16 18   ALKPHOS 52 34* 39  BILITOT 0.5 0.6 1.2  PROT 7.3 5.5* 5.8*  ALBUMIN 3.9 3.2* 3.1*   Coagulation Profile: Recent Labs  Lab 03/31/23 1252  INR 1.0   Cardiac Enzymes: Recent Labs  Lab 03/31/23 1252 04/01/23 0424 04/02/23 0558  CKTOTAL 742* 803* 467*  CKMB 13.5*  --   --    BNP (last 3 results) Recent Labs    12/09/22 2152  BNP 254.3*   Anemia Panel: Recent Labs    04/01/23 0424  VITAMINB12 234   Sepsis Labs: Recent Labs  Lab 03/31/23 1310 04/01/23 0705  LATICACIDVEN 5.4* 0.5    Recent Results (from the past 240 hours)  Surgical pcr screen     Status: None   Collection Time: 03/31/23  3:49 PM   Specimen: Nasal Mucosa; Nasal Swab  Result Value Ref Range Status   MRSA, PCR NEGATIVE NEGATIVE Final   Staphylococcus aureus NEGATIVE NEGATIVE Final    Comment: (NOTE) The Xpert SA Assay (FDA approved for NASAL specimens in patients 81 years of age and older), is one component of a comprehensive surveillance program. It is not intended to diagnose infection nor to guide or monitor treatment. Performed at [facility], [address] [place], [place] [address]      Radiology Studies: DG C-Arm 1-60 Min-No Report Result Date: 04/02/2023 Fluoroscopy was utilized by the requesting physician.  No radiographic interpretation.   DG Knee Right Port Result Date: 03/31/2023 CLINICAL DATA:  Fall.  Hip fracture. EXAM: PORTABLE RIGHT KNEE - 1-2 VIEW COMPARISON:  None Available. FINDINGS: There is diffuse osteopenia of the visualized osseous structures. No acute fracture or dislocation.  No aggressive osseous lesion. No significant degenerative changes. No knee effusion or focal soft tissue swelling. No radiopaque foreign bodies. IMPRESSION: *No acute osseous abnormality of the right knee. Electronically Signed   By: Jules Schick M.D.   On:  03/31/2023 16:38    Scheduled Meds:  [WUJ Hold] sodium chloride   Intravenous Once   [MAR Hold] sodium chloride   Intravenous Once   [MAR Hold] amiodarone  200 mg Oral Daily   [MAR Hold] amLODipine  5 mg Oral Daily   [MAR Hold] Chlorhexidine Gluconate Cloth  6 each Topical Daily   feeding supplement  237 mL Oral BID BM   [MAR Hold] folic acid  1 mg Oral Daily   [MAR Hold] metoprolol tartrate  12.5 mg Oral BID   multivitamin with minerals  1 tablet Per Tube Daily   Continuous Infusions:  lactated ringers       LOS: 2 days   Time spent: 40 minutes  Carollee Herter, DO  [facility]  04/02/2023, 2:03 PM

## 2023-04-02 NOTE — Assessment & Plan Note (Signed)
04-02-2023 repeat K in AM.

## 2023-04-02 NOTE — Progress Notes (Signed)
Initial Nutrition Assessment  DOCUMENTATION CODES:   Underweight  INTERVENTION:   - Ensure Enlive po BID, each supplement provides 350 kcal and 20 grams of protein  - MVI with minerals daily  - Agree with Regular diet order once diet advanced  NUTRITION DIAGNOSIS:   Increased nutrient needs related to hip fracture, post-op healing as evidenced by estimated needs.  GOAL:   Patient will meet greater than or equal to 90% of their needs  MONITOR:   PO intake, Supplement acceptance, Weight trends, Labs  REASON FOR ASSESSMENT:   Consult Hip fracture protocol  ASSESSMENT:   78 year old female who presented to the ED on 1/29 after a fall. PMH of anxiety, heart murmur, HTN, lung cancer, atrial fibrillation. Pt admitted with right hip fracture, AKI, traumatic rhabdomyolysis.  Pt is currently NPO for surgery. Pt in OR at time of RD visit. Unable to obtain diet and weight history at this time. No meal completions charted this admission.  Reviewed weight history in chart. Pt with a 5 kg (11%) weight loss from 12/10/22 to 03/31/23. This is significant for timeframe. BMI is indicative of underweight status. Suspect pt with malnutrition but unable to confirm without NFPE.  RD will order oral nutrition supplements to aid pt in meeting kcal and protein needs. Will also order daily MVI with minerals. Agree with regular diet order.  Medications reviewed and include: folic acid 1 mg daily  Labs reviewed: sodium 134, potassium 5.2, BUN 29, creatinine 1.02, ionized calcium 1.09, WBC 11.1, hemoglobin 10.7, HCT 31.1, platelets 102  NUTRITION - FOCUSED PHYSICAL EXAM:  Unable to complete. Pt in OR.  Diet Order:   Diet Order             Diet NPO time specified  Diet effective midnight                   EDUCATION NEEDS:   Not appropriate for education at this time  Skin:  Skin Assessment: Reviewed RN Assessment (abrasion to face)  Last BM:  03/31/23  Height:   Ht Readings  from Last 1 Encounters:  03/31/23 5' (1.524 m)    Weight:   Wt Readings from Last 1 Encounters:  03/31/23 39.5 kg    BMI:  Body mass index is 16.99 kg/m.  Estimated Nutritional Needs:   Kcal:  1350-1550  Protein:  60-75 grams  Fluid:  1.3-1.5 L    Mertie Clause, MS, RD, LDN Registered Dietitian II Please see AMiON for contact information.

## 2023-04-02 NOTE — Transfer of Care (Signed)
Immediate Anesthesia Transfer of Care Note  Patient: Sheena [name] [facility]  Procedure(s) Performed: RIGHT INTRAMEDULLARY (IM) NAIL INTERTROCHANTERIC (Right)  Patient Location: PACU  Anesthesia Type:General  Level of Consciousness: awake, alert , and patient cooperative  Airway & Oxygen Therapy: Patient Spontanous Breathing and Patient connected to face mask oxygen  Post-op Assessment: Report given to RN and Post -op Vital signs reviewed and stable  Post vital signs: Reviewed and stable  Last Vitals:  Vitals Value Taken Time  BP 108/90 04/02/23 1325  Temp    Pulse 54 04/02/23 1330  Resp 15 04/02/23 1330  SpO2 100 % 04/02/23 1330  Vitals shown include unfiled device data.  Last Pain:  Vitals:   04/02/23 1022  TempSrc: Oral  PainSc: 8       Patients Stated Pain Goal: 5 (04/02/23 0849)  Complications: No notable events documented.

## 2023-04-02 NOTE — Anesthesia Postprocedure Evaluation (Signed)
Anesthesia Post Note  Patient: Sheena [name] [facility]  Procedure(s) Performed: RIGHT INTRAMEDULLARY (IM) NAIL INTERTROCHANTERIC (Right)     Patient location during evaluation: PACU Anesthesia Type: Spinal Level of consciousness: oriented and awake and alert Pain management: pain level controlled Vital Signs Assessment: post-procedure vital signs reviewed and stable Respiratory status: spontaneous breathing, respiratory function stable, patient connected to nasal cannula oxygen and nonlabored ventilation Cardiovascular status: blood pressure returned to baseline and stable Postop Assessment: no headache, no backache, no apparent nausea or vomiting, spinal receding and patient able to bend at knees Anesthetic complications: no   No notable events documented.  Last Vitals:  Vitals:   04/02/23 1415 04/02/23 1430  BP: (!) 124/50 (!) 119/93  Pulse: (!) 53 (!) 58  Resp: 11 18  Temp:    SpO2: 100% 97%    Last Pain:  Vitals:   04/02/23 1430  TempSrc:   PainSc: 0-No pain                 Shantel Wesely A.

## 2023-04-02 NOTE — Interval H&P Note (Signed)
History and Physical Interval Note:  04/02/2023 11:27 AM  Sheena [name] [facility]  has presented today for surgery, with the diagnosis of Right Hip Fracture.  The various methods of treatment have been discussed with the patient and family. After consideration of risks, benefits and other options for treatment, the patient has consented to  Procedure(s): RIGHT INTRAMEDULLARY (IM) NAIL INTERTROCHANTERIC (Right) as a surgical intervention.  The patient's history has been reviewed, patient examined, no change in status, stable for surgery.  I have reviewed the patient's chart and labs.  Questions were answered to the patient's satisfaction.     Caryn Bee P Jizelle Conkey

## 2023-04-03 DIAGNOSIS — S72141A Displaced intertrochanteric fracture of right femur, initial encounter for closed fracture: Secondary | ICD-10-CM | POA: Diagnosis not present

## 2023-04-03 LAB — COMPREHENSIVE METABOLIC PANEL
ALT: 15 U/L (ref 0–44)
AST: 22 U/L (ref 15–41)
Albumin: 3.1 g/dL — ABNORMAL LOW (ref 3.5–5.0)
Alkaline Phosphatase: 38 U/L (ref 38–126)
Anion gap: 11 (ref 5–15)
BUN: 23 mg/dL (ref 8–23)
CO2: 21 mmol/L — ABNORMAL LOW (ref 22–32)
Calcium: 8.6 mg/dL — ABNORMAL LOW (ref 8.9–10.3)
Chloride: 101 mmol/L (ref 98–111)
Creatinine, Ser: 0.93 mg/dL (ref 0.44–1.00)
GFR, Estimated: >60 mL/min (ref >60)
Glucose, Bld: 140 mg/dL — ABNORMAL HIGH (ref 70–99)
Potassium: 4.9 mmol/L (ref 3.5–5.1)
Sodium: 133 mmol/L — ABNORMAL LOW (ref 135–145)
Total Bilirubin: 0.5 mg/dL (ref 0.0–1.2)
Total Protein: 5.6 g/dL — ABNORMAL LOW (ref 6.5–8.1)

## 2023-04-03 LAB — CBC WITH DIFFERENTIAL/PLATELET
Abs Immature Granulocytes: 0.06 10*3/uL (ref 0.00–0.07)
Basophils Absolute: 0 10*3/uL (ref 0.0–0.1)
Basophils Relative: 0 %
Eosinophils Absolute: 0 10*3/uL (ref 0.0–0.5)
Eosinophils Relative: 0 %
HCT: 27.7 % — ABNORMAL LOW (ref 36.0–46.0)
Hemoglobin: 9.1 g/dL — ABNORMAL LOW (ref 12.0–15.0)
Immature Granulocytes: 1 %
Lymphocytes Relative: 4 %
Lymphs Abs: 0.4 10*3/uL — ABNORMAL LOW (ref 0.7–4.0)
MCH: 33.2 pg (ref 26.0–34.0)
MCHC: 32.9 g/dL (ref 30.0–36.0)
MCV: 101.1 fL — ABNORMAL HIGH (ref 80.0–100.0)
Monocytes Absolute: 0.8 10*3/uL (ref 0.1–1.0)
Monocytes Relative: 9 %
Neutro Abs: 8.2 10*3/uL — ABNORMAL HIGH (ref 1.7–7.7)
Neutrophils Relative %: 86 %
Platelets: 89 10*3/uL — ABNORMAL LOW (ref 150–400)
RBC: 2.74 MIL/uL — ABNORMAL LOW (ref 3.87–5.11)
RDW: 17.6 % — ABNORMAL HIGH (ref 11.5–15.5)
WBC: 9.4 10*3/uL (ref 4.0–10.5)
nRBC: 0 % (ref 0.0–0.2)

## 2023-04-03 LAB — MAGNESIUM: Magnesium: 1.9 mg/dL (ref 1.7–2.4)

## 2023-04-03 MED ORDER — VITAMIN B-12 1000 MCG PO TABS
1000.0000 ug | ORAL_TABLET | Freq: Every day | ORAL | Status: DC
  Administered 2023-04-03 – 2023-04-06 (×4): 1000 ug via ORAL
  Filled 2023-04-03 (×4): qty 1

## 2023-04-03 MED ORDER — ADULT MULTIVITAMIN W/MINERALS CH
1.0000 | ORAL_TABLET | Freq: Every day | ORAL | Status: DC
  Administered 2023-04-04 – 2023-04-06 (×3): 1 via ORAL
  Filled 2023-04-03 (×3): qty 1

## 2023-04-03 NOTE — Progress Notes (Signed)
PROGRESS NOTE    Sheena [name]  ZOX:[id number] DOB: [birth year] DOA: 03/31/2023 PCP: [name], MD   Brief Narrative: Sheena [name] is a 78 y.o. female with a history of lung cancer, hypertension, paroxysmal atrial fibrillation not on anticoagulation, chronic low back pain.  Patient presented secondary to being found down in her apartment after a fall the day prior to admission with resultant right hip fracture.  Orthopedic surgery was consulted and performed an intramedullary nail placement on 1/31.  Hospitalization complicated by evidence of rhabdomyolysis on admission managed with IV fluids, in addition to severe anemia status post 2 units of PRBCs.   Assessment and Plan:  Closed intertrochanteric fracture of right hip Secondary to fall.  Orthopedic surgery performed intramedullary nail placement on 1/31.  Recommendations for weightbearing as tolerated.  PT/OT ordered with recommendations pending.  Acute blood loss anemia Secondary to fall and fracture. Hemoglobin down to 5.8 g/dL requiring 2 units of PRBC on 1/30. Posttransfusion hemoglobin of 11.9 and is stable.  AKI Baseline creatinine of about 1. Creatinine of 1.49 on admission which has improved with IV fluids.  Traumatic rhabdomyolysis Secondary to fall. CK mildly elevated at 742 on admission with peak to 803. Improved to 467; no significant/persistent renal failure..  Macrocytic anemia Baseline hemoglobin of about 9-10 g/dL. History of folate deficiency with low folate of 5.2 in August 2024. Folic acid started. Vitamin B12 is low-normal -Continue folic acid -Start vitamin B12 supplementation  Paroxysmal atrial fibrillation -Continue amiodarone and metoprolol  Primary small cell carcinoma of the lower lobe of left lung Noted.  Patient with a right-sided chest port in place.  Not accessed.  Hyperlipidemia Noted.  Patient is on Lipitor as an outpatient which was held on admission.  Chronic low  back pain Patient is on Lipitor as an outpatient which is held on admission secondary to rhabdomyolysis.  Primary hypertension Patient is on amlodipine and metoprolol as an outpatient. -Continue amlodipine and metoprolol     DVT prophylaxis: Per orthopedic surgery Code Status:   Code Status: Limited: Do not attempt resuscitation (DNR) -DNR-LIMITED -Do Not Intubate/DNI  Family Communication: None at bedside Disposition Plan: Discharge pending PT/OT recommendations, continued orthopedic surgery recommendations, stable hemoglobin.  Anticipate likely discharge to SNF.   Consultants:  Orthopedic surgery  Procedures:  Right ankle intramedullary nail placement  Antimicrobials: None   Subjective: Patient reports some right leg pain.  She has noted some swelling for which she was told was normal.  She has not noted any bleeding in her bowel movements  Objective: BP 129/64 (BP Location: Left Arm)   Pulse 63   Temp 98.3 F (36.8 C) (Oral)   Resp 16   Ht 5' (1.524 m)   Wt 39.5 kg   SpO2 96%   BMI 16.99 kg/m   Examination:  General exam: Appears calm and comfortable Respiratory system: Clear to auscultation. Respiratory effort normal. Cardiovascular system: S1 & S2 heard, RRR. No murmurs Gastrointestinal system: Abdomen is nondistended, soft and nontender. Normal bowel sounds heard. Central nervous system: Alert and oriented. No focal neurological deficits. Musculoskeletal: Right thigh has swelling noted significant edema.  No calf tenderness Skin: No ecchymosis noted around incision site Psychiatry: Judgement and insight appear normal. Mood & affect appropriate.    Data Reviewed: I have personally reviewed following labs and imaging studies  CBC Lab Results  Component Value Date   WBC 9.4 04/03/2023   RBC 2.74 (L) 04/03/2023   HGB 9.1 (L) 04/03/2023  HCT 27.7 (L) 04/03/2023   MCV 101.1 (H) 04/03/2023   MCH 33.2 04/03/2023   PLT 89 (L) 04/03/2023   MCHC 32.9  04/03/2023   RDW 17.6 (H) 04/03/2023   LYMPHSABS 0.4 (L) 04/03/2023   MONOABS 0.8 04/03/2023   EOSABS 0.0 04/03/2023   BASOSABS 0.0 04/03/2023     Last metabolic panel Lab Results  Component Value Date   NA 133 (L) 04/03/2023   K 4.9 04/03/2023   CL 101 04/03/2023   CO2 21 (L) 04/03/2023   BUN 23 04/03/2023   CREATININE 0.93 04/03/2023   GLUCOSE 140 (H) 04/03/2023   GFRNONAA >60 04/03/2023   GFRAA 41 (L) 01/18/2020   CALCIUM 8.6 (L) 04/03/2023   PHOS 4.2 09/14/2022   PROT 5.6 (L) 04/03/2023   ALBUMIN 3.1 (L) 04/03/2023   BILITOT 0.5 04/03/2023   ALKPHOS 38 04/03/2023   AST 22 04/03/2023   ALT 15 04/03/2023   ANIONGAP 11 04/03/2023    GFR: Estimated Creatinine Clearance: 32.6 mL/min (by C-G formula based on SCr of 0.93 mg/dL).  Recent Results (from the past 240 hours)  Surgical pcr screen     Status: None   Collection Time: 03/31/23  3:49 PM   Specimen: Nasal Mucosa; Nasal Swab  Result Value Ref Range Status   MRSA, PCR NEGATIVE NEGATIVE Final   Staphylococcus aureus NEGATIVE NEGATIVE Final    Comment: (NOTE) The Xpert SA Assay (FDA approved for NASAL specimens in patients 74 years of age and older), is one component of a comprehensive surveillance program. It is not intended to diagnose infection nor to guide or monitor treatment. Performed at [name], [address] [place], [place] [address]       Radiology Studies: DG HIP PORT UNILAT W OR W/O PELVIS 1V RIGHT Result Date: 04/02/2023 CLINICAL DATA:  Status postoperative fixation of a right hip intertrochanteric fracture. EXAM: DG HIP (WITH OR WITHOUT PELVIS) 1V PORT RIGHT COMPARISON:  03/31/2023 and earlier today. FINDINGS: Interval intramedullary rod and compression screw fixation of the previously demonstrated comminuted intertrochanteric fracture on the right. Essentially anatomic position and alignment of the major fragments. Diffuse osteopenia. No new fractures. IMPRESSION: Status post ORIF of  a right hip intertrochanteric fracture. Electronically Signed   By: Beckie Salts M.D.   On: 04/02/2023 19:31   DG HIP UNILAT WITH PELVIS 2-3 VIEWS RIGHT Result Date: 04/02/2023 CLINICAL DATA:  Elective surgery. Right hip intraoperative fluoroscopy. EXAM: DG HIP (WITH OR WITHOUT PELVIS) 2-3V RIGHT COMPARISON:  AP pelvis 03/31/2023 FINDINGS: Images were performed intraoperatively without the presence of a radiologist. The patient is undergoing cephalomedullary nail fixation of the previously seen intertrochanteric fracture of the proximal right femur. No hardware complication is seen. Improved alignment. Total fluoroscopy images: 6 Total fluoroscopy time: 65 seconds Total dose: Radiation Exposure Index (as provided by the fluoroscopic device): 4.35 mGy air Kerma Please see intraoperative findings for further detail. IMPRESSION: Intraoperative fluoroscopy for cephalomedullary nail fixation of the proximal right femur. Electronically Signed   By: Neita Garnet M.D.   On: 04/02/2023 15:43   DG C-Arm 1-60 Min-No Report Result Date: 04/02/2023 Fluoroscopy was utilized by the requesting physician.  No radiographic interpretation.      LOS: 3 days    Jacquelin Hawking, MD [facility] 04/03/2023, 8:26 AM   If 7PM-7AM, please contact night-coverage [email or web address]

## 2023-04-03 NOTE — Evaluation (Signed)
Physical Therapy Evaluation Patient Details Name: Sheena [name] [facility] MRN: [id number] DOB: 09/24/1945 Today's Date: 04/03/2023  History of Present Illness  78 y/o female presenting to acute care s/p cephalomedullary nailing of right intertrochanteric femur fx on 1/31. PMH includes small cell lung cancer status post chemoradiation, afib, hypertension, hyperlipidemia, osteoporosis with prior fragility fracture, anxiety, heart murmur.  Clinical Impression  Patient lives alone at home and does have assistance with ADL/IADLs from her children daily. The patient declined to sit in the chair due to increased pain in her right hip, however did agree to participate with bed mobility and transfers. The patient required Max A +2 physical assistance to transition EOB with some UE support using the bed handrails. The patient required assistance with trunk position and movement of bilateral LEs to transition EOB. Used a stedy for sit > stand transfer, as the patient was able to stand for ~30s before having to sit back down. Patient required Max A +2 physical assistance to transition from sit > supine. Patient will benefit from continued inpatient follow up therapy, <3 hours/day in order to reach an optimal level of independence for tranfers, gait, and stair training.          If plan is discharge home, recommend the following: Assistance with cooking/housework;Assist for transportation;Help with stairs or ramp for entrance;Two people to help with walking and/or transfers;A lot of help with bathing/dressing/bathroom   Can travel by private vehicle   No    Equipment Recommendations BSC/3in1;Wheelchair (measurements PT);Wheelchair cushion (measurements PT)  Recommendations for Other Services  OT consult    Functional Status Assessment Patient has had a recent decline in their functional status and demonstrates the ability to make significant improvements in function in a reasonable and predictable amount of  time.     Precautions / Restrictions Precautions Precautions: Fall Restrictions Weight Bearing Restrictions Per Provider Order: Yes RLE Weight Bearing Per Provider Order: Weight bearing as tolerated      Mobility  Bed Mobility Overal bed mobility: Needs Assistance Bed Mobility: Supine to Sit, Sit to Supine     Supine to sit: Max assist, +2 for physical assistance Sit to supine: Max assist, +2 for physical assistance   General bed mobility comments: SOme UE support to pull self to EOB, pt required additional assistance for trunk and movement of bilateral LEs    Transfers Overall transfer level: Needs assistance Equipment used: Ambulation equipment used Transfers: Sit to/from Stand Sit to Stand: Max assist, +2 physical assistance           General transfer comment: Patient able to use some UE support by lacing hands on stedy handle. Bilateral quad activation limited to complete stand Transfer via Lift Equipment: Stedy  Ambulation/Gait                  Stairs            Wheelchair Mobility     Tilt Bed    Modified Rankin (Stroke Patients Only)       Balance Overall balance assessment: Needs assistance Sitting-balance support: Bilateral upper extremity supported, Feet supported Sitting balance-Leahy Scale: Fair     Standing balance support: Bilateral upper extremity supported, Reliant on assistive device for balance Standing balance-Leahy Scale: Poor                               Pertinent Vitals/Pain Pain Assessment Pain Assessment: Faces Faces Pain Scale: Hurts even more  Pain Location: Right Hip Pain Descriptors / Indicators: Grimacing, Operative site guarding Pain Intervention(s): Limited activity within patient's tolerance, Monitored during session, Repositioned    Home Living Family/patient expects to be discharged to:: Private residence Living Arrangements: Alone Available Help at Discharge: Family;Available 24  hours/day Type of Home: House Home Access: Stairs to enter Entrance Stairs-Rails: Right Entrance Stairs-Number of Steps: 3 Alternate Level Stairs-Number of Steps: 14 steps Home Layout: Two level;Bed/bath upstairs Home Equipment: Rolling Walker (2 wheels);Cane - quad      Prior Function Prior Level of Function : Needs assist       Physical Assist : ADLs (physical)   ADLs (physical): Bathing;Toileting;IADLs;Feeding Mobility Comments: Pt reports using a RW and cane in house ADLs Comments: Daughter and son helps prepare meals, daughter helps bathe the pt in the bathroom on the first floor, both children assist with ADLs and IADLs. Pt reports preparing easy meals on her own     Extremity/Trunk Assessment   Upper Extremity Assessment Upper Extremity Assessment: Defer to OT evaluation    Lower Extremity Assessment Lower Extremity Assessment: Generalized weakness    Cervical / Trunk Assessment Cervical / Trunk Assessment: Kyphotic  Communication   Communication Communication: No apparent difficulties  Cognition Arousal: Alert Behavior During Therapy: WFL for tasks assessed/performed Overall Cognitive Status: Impaired/Different from baseline Area of Impairment: Memory                     Memory: Decreased short-term memory         General Comments: Patient reports hx of cancer        General Comments General comments (skin integrity, edema, etc.): O2: 91%; Pulse: 69bpm (after supine > sit transfer)    Exercises     Assessment/Plan    PT Assessment Patient needs continued PT services  PT Problem List Decreased strength;Decreased range of motion;Decreased activity tolerance;Decreased balance;Decreased mobility;Decreased coordination;Pain       PT Treatment Interventions DME instruction;Gait training;Stair training;Functional mobility training;Therapeutic activities;Therapeutic exercise;Balance training;Neuromuscular re-education    PT Goals (Current  goals can be found in the Care Plan section)  Acute Rehab PT Goals Patient Stated Goal: Wants to improve mobility PT Goal Formulation: With patient Time For Goal Achievement: 04/17/23 Potential to Achieve Goals: Good    Frequency Min 1X/week     Co-evaluation               AM-PAC PT "6 Clicks" Mobility  Outcome Measure Help needed turning from your back to your side while in a flat bed without using bedrails?: A Lot Help needed moving from lying on your back to sitting on the side of a flat bed without using bedrails?: A Lot Help needed moving to and from a bed to a chair (including a wheelchair)?: A Lot Help needed standing up from a chair using your arms (e.g., wheelchair or bedside chair)?: A Lot Help needed to walk in hospital room?: A Lot Help needed climbing 3-5 steps with a railing? : A Lot 6 Click Score: 12    End of Session Equipment Utilized During Treatment: Gait belt;Oxygen Activity Tolerance: Patient limited by pain Patient left: in bed;with call bell/phone within reach;with bed alarm set   PT Visit Diagnosis: History of falling (Z91.81);Difficulty in walking, not elsewhere classified (R26.2);Pain;Unsteadiness on feet (R26.81) Pain - Right/Left: Right Pain - part of body: Leg    Time: 0920-1002 PT Time Calculation (min) (ACUTE ONLY): 42 min   Charges:   PT Evaluation $PT Eval Moderate  Complexity: 1 Mod PT Treatments $Therapeutic Activity: 23-37 mins PT General Charges $$ ACUTE PT VISIT: 1 Visit       Doreen Beam, SPT   Pearlene Teat 04/03/2023, 11:24 AM

## 2023-04-03 NOTE — Progress Notes (Signed)
   ORTHOPAEDIC PROGRESS NOTE  s/p Procedure(s): RIGHT INTRAMEDULLARY (IM) NAIL INTERTROCHANTERIC by Dr. Jena Gauss 1/31  SUBJECTIVE: Patient endorses significant pain and swelling of the RLE. She has no additional complaints.    OBJECTIVE: PE:  Vitals:   04/03/23 0428 04/03/23 0855  BP: 129/64 118/62  Pulse: 63 65  Resp: 16   Temp: 98.3 F (36.8 C) 98.2 F (36.8 C)  SpO2: 96% 98%   Gen: Elderly appearing older than stated age. Says she's in pain CV: RRR Pulm: no increased WOB MSK: appropriately tender to palpation over lateral right thigh. Incisions x 2 with mepilex present unsaturated. Full ROM of knee and ankle. Nvi distally, sensation fully intact. Compartments soft. Mild swelling of RLE.   ASSESSMENT: Sheena [name] is a 78 y.o. female s/p R IMN intertrochanteric by Dr. Jena Gauss 1/31  PLAN: Weightbearing: WBAT RLE; mobilize with PT/OT Insicional and dressing care: Dressings left intact until follow-up and Reinforce dressings as needed Orthopedic device(s): None Showering: POD 3 VTE prophylaxis: Aspirin 81mg  BID  (home med),  Pain control: PO oxycodone, IV dilauded (breakthrough)  Follow - up plan: 2 weeks, dispo pending placement vs home with home health Contact information:  see [facility] sports med on call after hours. Renaye Rakers, MD; [facility]

## 2023-04-03 NOTE — Plan of Care (Signed)
  Problem: Acute Rehab PT Goals(only PT should resolve) Goal: Pt Will Go Supine/Side To Sit Outcome: Progressing Flowsheets (Taken 04/03/2023 1115) Pt will go Supine/Side to Sit: with contact guard assist Goal: Patient Will Transfer Sit To/From Stand Outcome: Progressing Flowsheets (Taken 04/03/2023 1115) Patient will transfer sit to/from stand: with contact guard assist Goal: Pt Will Ambulate Outcome: Progressing Flowsheets (Taken 04/03/2023 1115) Pt will Ambulate:  50 feet  with least restrictive assistive device  with contact guard assist Goal: Pt Will Go Up/Down Stairs Outcome: Progressing Flowsheets (Taken 04/03/2023 1115) Pt will Go Up / Down Stairs:  3-5 stairs  with least restrictive assistive device  with contact guard assist Goal: Pt Will Verbalize and Adhere to Precautions While Description: PT Will Verbalize and Adhere to Precautions While Performing Mobility Outcome: Progressing

## 2023-04-04 DIAGNOSIS — T796XXA Traumatic ischemia of muscle, initial encounter: Secondary | ICD-10-CM | POA: Diagnosis not present

## 2023-04-04 DIAGNOSIS — N179 Acute kidney failure, unspecified: Secondary | ICD-10-CM | POA: Diagnosis not present

## 2023-04-04 DIAGNOSIS — D62 Acute posthemorrhagic anemia: Secondary | ICD-10-CM | POA: Diagnosis not present

## 2023-04-04 DIAGNOSIS — S72141A Displaced intertrochanteric fracture of right femur, initial encounter for closed fracture: Secondary | ICD-10-CM | POA: Diagnosis not present

## 2023-04-04 LAB — TYPE AND SCREEN
ABO/RH(D): A NEG
Antibody Screen: NEGATIVE
Unit division: 0
Unit division: 0
Unit division: 0

## 2023-04-04 LAB — BPAM RBC
Blood Product Expiration Date: 202502072359
Blood Product Expiration Date: 202502072359
Blood Product Expiration Date: 202502152359
ISSUE DATE / TIME: 202501300637
ISSUE DATE / TIME: 202501301038
Unit Type and Rh: 600
Unit Type and Rh: 600
Unit Type and Rh: 600

## 2023-04-04 LAB — CBC
HCT: 30.6 % — ABNORMAL LOW (ref 36.0–46.0)
Hemoglobin: 9.9 g/dL — ABNORMAL LOW (ref 12.0–15.0)
MCH: 33.4 pg (ref 26.0–34.0)
MCHC: 32.4 g/dL (ref 30.0–36.0)
MCV: 103.4 fL — ABNORMAL HIGH (ref 80.0–100.0)
Platelets: 103 10*3/uL — ABNORMAL LOW (ref 150–400)
RBC: 2.96 MIL/uL — ABNORMAL LOW (ref 3.87–5.11)
RDW: 16.8 % — ABNORMAL HIGH (ref 11.5–15.5)
WBC: 8.7 10*3/uL (ref 4.0–10.5)
nRBC: 0 % (ref 0.0–0.2)

## 2023-04-04 NOTE — Plan of Care (Signed)
   Problem: Activity: Goal: Risk for activity intolerance will decrease Outcome: Progressing   Problem: Nutrition: Goal: Adequate nutrition will be maintained Outcome: Progressing   Problem: Coping: Goal: Level of anxiety will decrease Outcome: Progressing

## 2023-04-04 NOTE — Evaluation (Signed)
Occupational Therapy Evaluation Patient Details Name: Sheena [name] [facility] MRN: [id number] DOB: Nov 25, 1945 Today's Date: 04/04/2023   History of Present Illness 78 y/o female presenting to acute care s/p cephalomedullary nailing of right intertrochanteric femur fx on 1/31. PMH includes small cell lung cancer status post chemoradiation, afib, hypertension, hyperlipidemia, osteoporosis with prior fragility fracture, anxiety, heart murmur.   Clinical Impression   PTA, pt lived alone with family frequently stopping by to assist with meals and bathing. Pt can prepare simple microwave meals as needed most days. Upon eval, pt with significant RLE pain with all mobility and has some anxiety in anticipation of pain. Pt needing max A +2 for bed mobility this session. Able to sit EOB and perform grooming tasks with up to min A for balance, pt with ongoing pain with tylenol given at beginning of session. Pt deferred attempt to transfer this session. Patient will benefit from continued inpatient follow up therapy, <3 hours/day       If plan is discharge home, recommend the following: Two people to help with walking and/or transfers;Two people to help with bathing/dressing/bathroom;Assistance with cooking/housework;Assist for transportation;Help with stairs or ramp for entrance    Functional Status Assessment  Patient has had a recent decline in their functional status and demonstrates the ability to make significant improvements in function in a reasonable and predictable amount of time.  Equipment Recommendations  BSC/3in1;Wheelchair (measurements OT);Wheelchair cushion (measurements OT)    Recommendations for Other Services       Precautions / Restrictions Precautions Precautions: Fall Restrictions Weight Bearing Restrictions Per Provider Order: Yes RLE Weight Bearing Per Provider Order: Weight bearing as tolerated      Mobility Bed Mobility Overal bed mobility: Needs Assistance Bed  Mobility: Supine to Sit, Sit to Supine     Supine to sit: Max assist, +2 for physical assistance Sit to supine: Max assist, +2 for physical assistance   General bed mobility comments: SOme UE support to pull self to EOB, pt required additional assistance for trunk and movement of bilateral LEs    Transfers                   General transfer comment: Pt deferred reporting in too much pain despite recent tylenol      Balance Overall balance assessment: Needs assistance Sitting-balance support: Bilateral upper extremity supported, Feet supported Sitting balance-Leahy Scale: Fair     Standing balance support: Bilateral upper extremity supported, Reliant on assistive device for balance Standing balance-Leahy Scale: Poor                             ADL either performed or assessed with clinical judgement   ADL Overall ADL's : Needs assistance/impaired Eating/Feeding: Set up;Bed level   Grooming: Minimal assistance;Sitting Grooming Details (indicate cue type and reason): intermittent assist for balance seated EOB with feet supported Upper Body Bathing: Minimal assistance   Lower Body Bathing: Total assistance   Upper Body Dressing : Sitting;Minimal assistance   Lower Body Dressing: Total assistance;Sitting/lateral leans;Bed level     Toilet Transfer Details (indicate cue type and reason): deferred pt reporting 10/10 pain                 Vision Ability to See in Adequate Light: 0 Adequate Patient Visual Report: No change from baseline Additional Comments: not formally assessed     Perception         Praxis  Pertinent Vitals/Pain Pain Assessment Pain Assessment: Faces Faces Pain Scale: Hurts even more Pain Location: Right Hip Pain Descriptors / Indicators: Grimacing, Operative site guarding Pain Intervention(s): Limited activity within patient's tolerance, Monitored during session     Extremity/Trunk Assessment Upper Extremity  Assessment Upper Extremity Assessment: Generalized weakness   Lower Extremity Assessment Lower Extremity Assessment: Defer to PT evaluation   Cervical / Trunk Assessment Cervical / Trunk Assessment: Kyphotic   Communication Communication Communication: No apparent difficulties   Cognition Arousal: Alert Behavior During Therapy: WFL for tasks assessed/performed Overall Cognitive Status: Impaired/Different from baseline Area of Impairment: Memory                     Memory: Decreased short-term memory         General Comments: Patient reports hx of cancer. benefits from education regarding mobility progression     General Comments       Exercises     Shoulder Instructions      Home Living Family/patient expects to be discharged to:: Private residence Living Arrangements: Alone Available Help at Discharge: Family;Available 24 hours/day Type of Home: House Home Access: Stairs to enter [employer] of Steps: 3 Entrance Stairs-Rails: Right Home Layout: Two level;Bed/bath upstairs Alternate Level Stairs-Number of Steps: 14 steps Alternate Level Stairs-Rails: Right Bathroom Shower/Tub: Producer, television/film/video: Handicapped height Bathroom Accessibility: Yes   Home Equipment: Agricultural consultant (2 wheels);Cane - quad          Prior Functioning/Environment Prior Level of Function : Needs assist       Physical Assist : ADLs (physical)   ADLs (physical): Bathing;Toileting;IADLs;Feeding Mobility Comments: Pt reports using a RW and cane in house ADLs Comments: Daughter and son helps prepare meals, daughter helps bathe the pt in the bathroom on the first floor, both children assist with ADLs and IADLs. Pt reports preparing easy meals on her own. alone at home while daughter is at work        OT Problem List: Decreased strength      OT Treatment/Interventions: Self-care/ADL training;Therapeutic exercise;DME and/or AE instruction;Balance  training;Patient/family education;Therapeutic activities    OT Goals(Current goals can be found in the care plan section) Acute Rehab OT Goals Patient Stated Goal: decreased pain OT Goal Formulation: With patient Time For Goal Achievement: 04/18/23 Potential to Achieve Goals: Good  OT Frequency: Min 1X/week    Co-evaluation              AM-PAC OT "6 Clicks" Daily Activity     Outcome Measure Help from another person eating meals?: A Little Help from another person taking care of personal grooming?: A Little Help from another person toileting, which includes using toliet, bedpan, or urinal?: Total Help from another person bathing (including washing, rinsing, drying)?: A Lot Help from another person to put on and taking off regular upper body clothing?: A Little Help from another person to put on and taking off regular lower body clothing?: Total 6 Click Score: 13   End of Session Equipment Utilized During Treatment: Oxygen Nurse Communication: Mobility status  Activity Tolerance: Patient tolerated treatment well Patient left: in bed;with call bell/phone within reach;with bed alarm set;with family/visitor present  OT Visit Diagnosis: Unsteadiness on feet (R26.81);Muscle weakness (generalized) (M62.81);History of falling (Z91.81);Pain Pain - Right/Left: Right Pain - part of body: Hip;Leg;Knee                Time: 1027-2536 OT Time Calculation (min): 36 min Charges:  OT General  Charges $OT Visit: 1 Visit OT Evaluation $OT Eval Moderate Complexity: 1 Mod OT Treatments $Self Care/Home Management : 8-22 mins  Tyler Deis, OTR/L [facility] Acute Rehabilitation Office: [phone]   Myrla Halsted 04/04/2023, 1:35 PM

## 2023-04-04 NOTE — Progress Notes (Signed)
PROGRESS NOTE    Sheena [name] [facility]  ZOX:[id number] DOB: 1945-11-18 DOA: 03/31/2023 PCP: [name], MD   Brief Narrative: Krystall Kruckenberg is a 78 y.o. female with a history of lung cancer, hypertension, paroxysmal atrial fibrillation not on anticoagulation, chronic low back pain.  Patient presented secondary to being found down in her apartment after a fall the day prior to admission with resultant right hip fracture.  Orthopedic surgery was consulted and performed an intramedullary nail placement on 1/31.  Hospitalization complicated by evidence of rhabdomyolysis on admission managed with IV fluids, in addition to severe anemia status post 2 units of PRBCs.   Assessment and Plan:  Closed intertrochanteric fracture of right hip Secondary to fall.  Orthopedic surgery performed intramedullary nail placement on 1/31.  Recommendations for weightbearing as tolerated.  PT/OT ordered with recommendations pending.  Acute blood loss anemia Secondary to fall and fracture. Hemoglobin down to 5.8 g/dL requiring 2 units of PRBC on 1/30. Posttransfusion hemoglobin of 11.9 and is stable. -Follow-up CBC today  AKI Baseline creatinine of about 1. Creatinine of 1.49 on admission which has improved with IV fluids.  Traumatic rhabdomyolysis Secondary to fall. CK mildly elevated at 742 on admission with peak to 803. Improved to 467; no significant/persistent renal failure.  Macrocytic anemia Baseline hemoglobin of about 9-10 g/dL. History of folate deficiency with low folate of 5.2 in August 2024. Folic acid started. Vitamin B12 is low-normal -Continue folic acid and vitamin B12 supplementation  Paroxysmal atrial fibrillation -Continue amiodarone and metoprolol  Primary small cell carcinoma of the lower lobe of left lung Noted.  Patient with a right-sided chest port in place.  Not accessed.  Hyperlipidemia Noted.  Patient is on Lipitor as an outpatient which was held on  admission.  Chronic low back pain Patient is on Lipitor as an outpatient which is held on admission secondary to rhabdomyolysis.  Primary hypertension Patient is on amlodipine and metoprolol as an outpatient. -Continue amlodipine and metoprolol     DVT prophylaxis: Per orthopedic surgery Code Status:   Code Status: Limited: Do not attempt resuscitation (DNR) -DNR-LIMITED -Do Not Intubate/DNI  Family Communication: None at bedside Disposition Plan: Medically stable for discharge.  Anticipate likely discharge to SNF.   Consultants:  Orthopedic surgery  Procedures:  Right ankle intramedullary nail placement  Antimicrobials: None   Subjective: Some pain. Concerns about leaving the hospital because of concerns for safety if she falls in the future. We discussed getting a personal device that allows her to call for help.  Objective: BP (!) 137/51 (BP Location: Left Arm)   Pulse 74   Temp 98 F (36.7 C) (Oral)   Resp 18   Ht 5' (1.524 m)   Wt 39.5 kg   SpO2 100%   BMI 16.99 kg/m   Examination:  General exam: Appears calm and comfortable Respiratory system: Clear to auscultation. Respiratory effort normal. Cardiovascular system: S1 & S2 heard, RRR. Gastrointestinal system: Abdomen is nondistended, soft and nontender. Normal bowel sounds heard. Central nervous system: Alert and oriented. No focal neurological deficits. Psychiatry: Judgement and insight appear normal. Mood & affect appropriate.    Data Reviewed: I have personally reviewed following labs and imaging studies  CBC Lab Results  Component Value Date   WBC 9.4 04/03/2023   RBC 2.74 (L) 04/03/2023   HGB 9.1 (L) 04/03/2023   HCT 27.7 (L) 04/03/2023   MCV 101.1 (H) 04/03/2023   MCH 33.2 04/03/2023   PLT 89 (L) 04/03/2023  MCHC 32.9 04/03/2023   RDW 17.6 (H) 04/03/2023   LYMPHSABS 0.4 (L) 04/03/2023   MONOABS 0.8 04/03/2023   EOSABS 0.0 04/03/2023   BASOSABS 0.0 04/03/2023     Last metabolic  panel Lab Results  Component Value Date   NA 133 (L) 04/03/2023   K 4.9 04/03/2023   CL 101 04/03/2023   CO2 21 (L) 04/03/2023   BUN 23 04/03/2023   CREATININE 0.93 04/03/2023   GLUCOSE 140 (H) 04/03/2023   GFRNONAA >60 04/03/2023   GFRAA 41 (L) 01/18/2020   CALCIUM 8.6 (L) 04/03/2023   PHOS 4.2 09/14/2022   PROT 5.6 (L) 04/03/2023   ALBUMIN 3.1 (L) 04/03/2023   BILITOT 0.5 04/03/2023   ALKPHOS 38 04/03/2023   AST 22 04/03/2023   ALT 15 04/03/2023   ANIONGAP 11 04/03/2023    GFR: Estimated Creatinine Clearance: 32.6 mL/min (by C-G formula based on SCr of 0.93 mg/dL).  Recent Results (from the past 240 hours)  Surgical pcr screen     Status: None   Collection Time: 03/31/23  3:49 PM   Specimen: Nasal Mucosa; Nasal Swab  Result Value Ref Range Status   MRSA, PCR NEGATIVE NEGATIVE Final   Staphylococcus aureus NEGATIVE NEGATIVE Final    Comment: (NOTE) The Xpert SA Assay (FDA approved for NASAL specimens in patients 76 years of age and older), is one component of a comprehensive surveillance program. It is not intended to diagnose infection nor to guide or monitor treatment. Performed at [facility], [address], [place], [place] [address]       Radiology Studies: DG HIP PORT UNILAT W OR W/O PELVIS 1V RIGHT Result Date: 04/02/2023 CLINICAL DATA:  Status postoperative fixation of a right hip intertrochanteric fracture. EXAM: DG HIP (WITH OR WITHOUT PELVIS) 1V PORT RIGHT COMPARISON:  03/31/2023 and earlier today. FINDINGS: Interval intramedullary rod and compression screw fixation of the previously demonstrated comminuted intertrochanteric fracture on the right. Essentially anatomic position and alignment of the major fragments. Diffuse osteopenia. No new fractures. IMPRESSION: Status post ORIF of a right hip intertrochanteric fracture. Electronically Signed   By: Beckie Salts M.D.   On: 04/02/2023 19:31   DG HIP UNILAT WITH PELVIS 2-3 VIEWS RIGHT Result Date:  04/02/2023 CLINICAL DATA:  Elective surgery. Right hip intraoperative fluoroscopy. EXAM: DG HIP (WITH OR WITHOUT PELVIS) 2-3V RIGHT COMPARISON:  AP pelvis 03/31/2023 FINDINGS: Images were performed intraoperatively without the presence of a radiologist. The patient is undergoing cephalomedullary nail fixation of the previously seen intertrochanteric fracture of the proximal right femur. No hardware complication is seen. Improved alignment. Total fluoroscopy images: 6 Total fluoroscopy time: 65 seconds Total dose: Radiation Exposure Index (as provided by the fluoroscopic device): 4.35 mGy air Kerma Please see intraoperative findings for further detail. IMPRESSION: Intraoperative fluoroscopy for cephalomedullary nail fixation of the proximal right femur. Electronically Signed   By: Neita Garnet M.D.   On: 04/02/2023 15:43   DG C-Arm 1-60 Min-No Report Result Date: 04/02/2023 Fluoroscopy was utilized by the requesting physician.  No radiographic interpretation.      LOS: 4 days    Jacquelin Hawking, MD [facility] 04/04/2023, 8:27 AM   If 7PM-7AM, please contact night-coverage [email or web address]

## 2023-04-04 NOTE — Progress Notes (Signed)
Subjective: Patient reports pain as marked.  Tolerating diet.  Urinating.   No CP, SOB.  Hasn't been able to mobilize OOB with PT much due to pain.  Objective:   VITALS:   Vitals:   04/03/23 1414 04/03/23 2238 04/04/23 0409 04/04/23 0856  BP: (!) 92/54 (!) 110/55 (!) 137/51 (!) 102/59  Pulse: 66 77 74 72  Resp: 19 18 18 18   Temp: 98.4 F (36.9 C) 98.1 F (36.7 C) 98 F (36.7 C) 98.2 F (36.8 C)  TempSrc: Oral Oral Oral Oral  SpO2: 96% 99% 100% 98%  Weight:      Height:          Latest Ref Rng & Units 04/03/2023    5:15 AM 04/02/2023    4:25 PM 04/02/2023   10:30 AM  CBC  WBC 4.0 - 10.5 K/uL 9.4  10.0    Hemoglobin 12.0 - 15.0 g/dL 9.1  9.8  45.4   Hematocrit 36.0 - 46.0 % 27.7  29.3  32.0   Platelets 150 - 400 K/uL 89  92        Latest Ref Rng & Units 04/03/2023    5:15 AM 04/02/2023   10:30 AM 04/02/2023    5:58 AM  BMP  Glucose 70 - 99 mg/dL 098  95  119   BUN 8 - 23 mg/dL 23  29  29    Creatinine 0.44 - 1.00 mg/dL 1.47  8.29  5.62   Sodium 135 - 145 mmol/L 133  137  134   Potassium 3.5 - 5.1 mmol/L 4.9  4.8  5.2   Chloride 98 - 111 mmol/L 101  105  104   CO2 22 - 32 mmol/L 21   21   Calcium 8.9 - 10.3 mg/dL 8.6   8.3    Intake/Output      02/01 0701 02/02 0700 02/02 0701 02/03 0700   P.O. 220    I.V. (mL/kg) 1105.3 (28)    Total Intake(mL/kg) 1325.3 (33.6)    Blood     Total Output     Net +1325.3            Physical Exam: General: NAD.  Laying in bed, calm, somewhat uncomfortable Resp: No increased wob Cardio: regular rate and rhythm ABD soft Neurologically intact MSK Neurovascularly intact Sensation intact distally Intact pulses distally Dorsiflexion/Plantar flexion intact Incision: dressing C/D/I   Assessment: 2 Days Post-Op  S/P Procedure(s) (LRB): RIGHT INTRAMEDULLARY (IM) NAIL INTERTROCHANTERIC (Right) by Dr. Jena Gauss on 04/02/23  Principal Problem:   Closed intertrochanteric fracture of hip, right, initial encounter [facility]) - s/p  ORIF 04-02-2023 with IM nail Active Problems:   Essential hypertension, benign   Chronic low back pain   Hyperlipidemia   Primary small cell carcinoma of lower lobe of left lung (HCC)   PAF (paroxysmal atrial fibrillation) (HCC) - not on systemic anticoagulation due to hx of GI bleeding   Traumatic rhabdomyolysis (HCC)   AKI (acute kidney injury) (HCC)   Macrocytic anemia   DNR (do not resuscitate)/DNI(Do Not Intubate)   Acute blood loss anemia   Hyperkalemia   Plan:  Advance diet Up with therapy Incentive Spirometry Elevate and Apply ice  Weightbearing: WBAT RLE Insicional and dressing care: Reinforce dressings as needed Orthopedic device(s): None Showering: Keep dressing dry VTE prophylaxis: Aspirin 81mg  qd , SCDs, ambulation Pain control: PRN Follow - up plan: 2 weeks in office with [facility] Contact information:  Margarita Rana MD, Levester Fresh PA-C  Dispo:  PT recommending SNF.     Jenne Pane, PA-C Office [phone] 04/04/2023, 8:59 AM

## 2023-04-04 NOTE — TOC Initial Note (Signed)
Transition of Care [facility]) - Initial/Assessment Note    Patient Details  Name: Sheena [name] [facility] MRN: [id number] Date of Birth: [birth year]  Transition of Care [facility] CM/SW Contact:    Jimmy Picket, LCSW Phone Number: 04/04/2023, 3:59 PM  Clinical Narrative:                  CSW received SNF consult. CSW met with pt at bedside. CSW introduced self and explained role at the hospital. Pt reports that PTA he was living at home. Her children help out as they can.  CSW reviewed PT/OT recommendations for SNF. Pt reports she is OK with going to SNF.  Pt gave CSW permission to fax out to facilities in the area. Pt has been to [facility] in the past and would prefer to return. CSW gave pt medicare.gov rating list to review. CSW explained insurance auth process.  CSW will continue to follow.   Expected Discharge Plan: Skilled Nursing Facility Barriers to Discharge: Continued Medical Work up   Patient Goals and CMS Choice Patient states their goals for this hospitalization and ongoing recovery are:: Im afraid to go home  I need to get stronger. CMS Medicare.gov Compare Post Acute Care list provided to:: Patient Choice offered to / list presented to : Patient [place] ownership interest in [facility].provided to:: Patient    Expected Discharge Plan and Services   Discharge Planning Services: CM Consult   Living arrangements for the past 2 months: Single Family Home                             HH Agency: [facility] Date [facility] Agency Contacted: 04/01/23 Time HH Agency Contacted: 1037 Representative spoke with at [facility] Agency: Tresa Endo  Prior Living Arrangements/Services Living arrangements for the past 2 months: Single Family Home Lives with:: Self Patient language and need for interpreter reviewed:: Yes Do you feel safe going back to the place where you live?: Yes      Need for Family Participation in Patient Care: Yes (Comment) Care giver support system in  place?: Yes (comment) Current home services: Home PT, Home RN, Home OT, Homehealth aide Criminal Activity/Legal Involvement Pertinent to Current Situation/Hospitalization: No - Comment as needed  Activities of Daily Living   ADL Screening (condition at time of admission) Independently performs ADLs?: No Does the patient have a NEW difficulty with bathing/dressing/toileting/self-feeding that is expected to last >3 days?: Yes (Initiates electronic notice to provider for possible OT consult) Does the patient have a NEW difficulty with getting in/out of bed, walking, or climbing stairs that is expected to last >3 days?: Yes (Initiates electronic notice to provider for possible PT consult) Does the patient have a NEW difficulty with communication that is expected to last >3 days?: No Is the patient deaf or have difficulty hearing?: No Does the patient have difficulty seeing, even when wearing glasses/contacts?: No Does the patient have difficulty concentrating, remembering, or making decisions?: No  Permission Sought/Granted Permission sought to share information with : Case Manager, Magazine features editor Permission granted to share information with : Yes, Verbal Permission Granted     Permission granted to share info w AGENCY: SNF        Emotional Assessment Appearance:: Appears stated age Attitude/Demeanor/Rapport: Engaged Affect (typically observed): Appropriate Orientation: : Oriented to Self, Oriented to Place, Oriented to  Time, Oriented to Situation Alcohol / Substance Use: Tobacco Use Psych Involvement: No (comment)  Admission diagnosis:  Fall, initial encounter [W19.XXXA] Closed intertrochanteric fracture of hip, right, initial encounter (HCC) [S72.141A] Displaced intertrochanteric fracture of right femur, initial encounter for closed fracture (HCC) [S72.141A] Patient Active Problem List   Diagnosis Date Noted   Hyperkalemia 04/02/2023   Acute blood loss anemia  04/01/2023   Closed intertrochanteric fracture of hip, right, initial encounter (HCC) - s/p ORIF 04-02-2023 with IM nail 03/31/2023   Traumatic rhabdomyolysis (HCC) 03/31/2023   AKI (acute kidney injury) (HCC) 03/31/2023   Macrocytic anemia 03/31/2023   DNR (do not resuscitate)/DNI(Do Not Intubate) 03/31/2023   History of lung cancer 12/16/2022   Lumbar vertebral fracture (HCC) 12/10/2022   PAF (paroxysmal atrial fibrillation) (HCC) - not on systemic anticoagulation due to hx of GI bleeding 09/10/2022   Hypokalemia 09/10/2022   Pancytopenia (HCC) 09/07/2022   Port-A-Cath in place 08/11/2022   Primary small cell carcinoma of lower lobe of left lung (HCC) 07/08/2022   Adenopathy 06/30/2022   Aortic atherosclerosis (HCC) 01/18/2020   Grade II diastolic dysfunction 03/06/2015   Hyperlipidemia 03/05/2014   Former smoker 02/19/2014   Disequilibrium 02/19/2014   History of vertebral compression fracture 01/04/2013   Chronic low back pain 12/13/2012   Sinusitis, chronic 09/03/2012   Essential hypertension, benign 09/03/2012   Ataxia 04/01/2011   Anxiety 04/01/2011   Blurred vision 03/21/2010   Osteoporosis 03/21/2010   MURMUR 03/21/2010   Hyperglycemia 03/21/2010   PCP:  [name], MD Pharmacy:   [id number] [place], [place] - [address] [place] [address] Phone: [phone] Fax: [phone]     Social Drivers of Health (SDOH) Social History: SDOH Screenings   Food Insecurity: No Food Insecurity (04/01/2023)  Housing: Low Risk  (04/01/2023)  Transportation Needs: No Transportation Needs (04/01/2023)  Utilities: Not At Risk (04/01/2023)  Depression (PHQ2-9): Low Risk  (03/17/2023)  Recent Concern: Depression (PHQ2-9) - Medium Risk (02/03/2023)  Financial Resource Strain: Low Risk  (03/17/2023)  Physical Activity: Inactive (03/17/2023)  Social Connections: Socially Isolated (04/01/2023)  Stress: No Stress  Concern Present (03/17/2023)  Tobacco Use: Medium Risk (04/01/2023)  Health Literacy: Adequate Health Literacy (03/17/2023)   SDOH Interventions:     Readmission Risk Interventions    10/09/2022    2:31 PM  Readmission Risk Prevention Plan  Transportation Screening Complete  PCP or Specialist Appt within 3-5 Days Complete  HRI or Home Care Consult Complete  Social Work Consult for Recovery Care Planning/Counseling Complete  Palliative Care Screening Not Applicable  Medication Review Oceanographer) Complete

## 2023-04-05 ENCOUNTER — Encounter (HOSPITAL_COMMUNITY): Payer: Self-pay | Admitting: Student

## 2023-04-05 DIAGNOSIS — D62 Acute posthemorrhagic anemia: Secondary | ICD-10-CM | POA: Diagnosis not present

## 2023-04-05 DIAGNOSIS — N179 Acute kidney failure, unspecified: Secondary | ICD-10-CM | POA: Diagnosis not present

## 2023-04-05 DIAGNOSIS — S72141A Displaced intertrochanteric fracture of right femur, initial encounter for closed fracture: Secondary | ICD-10-CM | POA: Diagnosis not present

## 2023-04-05 DIAGNOSIS — T796XXA Traumatic ischemia of muscle, initial encounter: Secondary | ICD-10-CM | POA: Diagnosis not present

## 2023-04-05 LAB — FOLATE RBC
Folate, Hemolysate: 299 ng/mL
Folate, RBC: 940 ng/mL (ref >498)
Hematocrit: 31.8 % — ABNORMAL LOW (ref 34.0–46.6)

## 2023-04-05 MED ORDER — OXYCODONE HCL 5 MG PO TABS: 5.0000 mg | ORAL_TABLET | ORAL | 0 refills | Status: DC | PRN

## 2023-04-05 MED ORDER — ASPIRIN 81 MG PO TBEC: 81.0000 mg | DELAYED_RELEASE_TABLET | Freq: Every day | ORAL | 0 refills | Status: AC

## 2023-04-05 MED ORDER — METHOCARBAMOL 500 MG PO TABS: 500.0000 mg | ORAL_TABLET | Freq: Four times a day (QID) | ORAL | 0 refills | Status: AC | PRN

## 2023-04-05 NOTE — Progress Notes (Signed)
Orthopaedic Trauma Progress Note  SUBJECTIVE: Sleeping in bed comfortably this AM. Notes some pain in the right hip.  No chest pain. No SOB. No nausea/vomiting. No other complaints. States she was able to mobilize some with therapies over the weekend.  BP noted to be soft this AM, medicine team aware and they plan to hold BP meds  OBJECTIVE:  Vitals:   04/05/23 0459 04/05/23 0731  BP: (!) 143/54 (!) 98/58  Pulse: 85 95  Resp: 18 18  Temp: 98.3 F (36.8 C) 98.4 F (36.9 C)  SpO2: 97% 96%    General: Resting in bed, NAD Respiratory: No increased work of breathing.  RLE: Dressing CDI. Mildly tender over the hip as expected. Ankle DF/PF intact. Compartments soft and compressible. Endorses sensation throughout extrmity. Able to wiggle toes. +DP pulse  IMAGING: Stable post op imaging.   LABS:  Results for orders placed or performed during the hospital encounter of 03/31/23 (from the past 24 hours)  CBC     Status: Abnormal   Collection Time: 04/04/23  8:30 PM  Result Value Ref Range   WBC 8.7 4.0 - 10.5 K/uL   RBC 2.96 (L) 3.87 - 5.11 MIL/uL   Hemoglobin 9.9 (L) 12.0 - 15.0 g/dL   HCT 16.1 (L) 09.6 - 04.5 %   MCV 103.4 (H) 80.0 - 100.0 fL   MCH 33.4 26.0 - 34.0 pg   MCHC 32.4 30.0 - 36.0 g/dL   RDW 40.9 (H) 81.1 - 91.4 %   Platelets 103 (L) 150 - 400 K/uL   nRBC 0.0 0.0 - 0.2 %    ASSESSMENT: Sheena [name] is a 78 y.o. female, 3 Days Post-Op s/p RIGHT INTRAMEDULLARY (IM) NAIL INTERTROCHANTERIC  CV/Blood loss: Acute blood loss anemia, Hgb 9.9 on 04/04/23. Hemodynamically stable  PLAN: Weightbearing: WBAT RLE ROM:  Ok for hip and knee motion as tolerated  Incisional and dressing care: Change dressing PRN Showering:  Ok for incisions to get wet Orthopedic device(s): None  Pain management:  1. Tylenol 650 mg q 6 hours scheduled 2. Robaxin 500 mg q 6 hours PRN 3. Oxycodone 5 mg q 4 hours PRN 4. Dilaudid 0.5 mg q 4 hours PRN VTE prophylaxis: Aspirin, SCDs ID:  Ancef  2gm post op completed Foley/Lines:  No foley, KVO IVFs Impediments to Fracture Healing: Vit D level 42 when checked in 2023, continue home dose supplementation Dispo: PT/OT eval ongoing, recommending SNF. Okay for discharge from ortho standpoint once cleared by medicine team and therapies. I have signed and placed d/c rx for pain medication, ASA 81 mg, and muscle relaxer in patient's chart  D/C recommendations: - Oxycodone, Robaxin, Tylenol for pain control - ASA 81 mg daily x 30 days for DVT prophylaxis - Continue home dose Vit D supplementation  Follow - up plan: 2 weeks after d/c for wound check and repeat x-rays   Contact information:  Truitt Merle MD, Thyra Breed PA-C. After hours and holidays please check [email or web address] for group call information for [facility]   Thompson Caul, PA-C [phone] (office) [email or web address]

## 2023-04-05 NOTE — Discharge Instructions (Signed)
Orthopaedic Trauma Service Discharge Instructions   General Discharge Instructions  WEIGHT BEARING STATUS:weightbearing as tolerated  RANGE OF MOTION/ACTIVITY: ok for hip and knee motion as tolerated  Wound Care: You may remove your surgical dressing. Incisions can be left open to air if there is no drainage. Once the incision is completely dry and without drainage, it may be left open to air out.  Showering may begin Monday 04/05/23.  Clean incision gently with soap and water.  DVT/PE prophylaxis: Aspirin 81 mg daily x 30 days  Diet: as you were eating previously.  Can use over the counter stool softeners and bowel preparations, such as Miralax, to help with bowel movements.  Narcotics can be constipating.  Be sure to drink plenty of fluids  PAIN MEDICATION USE AND EXPECTATIONS  You have likely been given narcotic medications to help control your pain.  After a traumatic event that results in an fracture (broken bone) with or without surgery, it is ok to use narcotic pain medications to help control one's pain.  We understand that everyone responds to pain differently and each individual patient will be evaluated on a regular basis for the continued need for narcotic medications. Ideally, narcotic medication use should last no more than 6-8 weeks (coinciding with fracture healing).   As a patient it is your responsibility as well to monitor narcotic medication use and report the amount and frequency you use these medications when you come to your office visit.   We would also advise that if you are using narcotic medications, you should take a dose prior to therapy to maximize you participation.  IF YOU ARE ON NARCOTIC MEDICATIONS IT IS NOT PERMISSIBLE TO OPERATE A MOTOR VEHICLE (MOTORCYCLE/CAR/TRUCK/MOPED) OR HEAVY MACHINERY DO NOT MIX NARCOTICS WITH OTHER CNS (CENTRAL NERVOUS SYSTEM) DEPRESSANTS SUCH AS ALCOHOL   STOP SMOKING OR USING NICOTINE PRODUCTS!!!!  As discussed nicotine  severely impairs your body's ability to heal surgical and traumatic wounds but also impairs bone healing.  Wounds and bone heal by forming microscopic blood vessels (angiogenesis) and nicotine is a vasoconstrictor (essentially, shrinks blood vessels).  Therefore, if vasoconstriction occurs to these microscopic blood vessels they essentially disappear and are unable to deliver necessary nutrients to the healing tissue.  This is one modifiable factor that you can do to dramatically increase your chances of healing your injury.    (This means no smoking, no nicotine gum, patches, etc)  DO NOT USE NONSTEROIDAL ANTI-INFLAMMATORY DRUGS (NSAID'S)  Using products such as Advil (ibuprofen), Aleve (naproxen), Motrin (ibuprofen) for additional pain control during fracture healing can delay and/or prevent the healing response.  If you would like to take over the counter (OTC) medication, Tylenol (acetaminophen) is ok.  However, some narcotic medications that are given for pain control contain acetaminophen as well. Therefore, you should not exceed more than 4000 mg of tylenol in a day if you do not have liver disease.  Also note that there are may OTC medicines, such as cold medicines and allergy medicines that my contain tylenol as well.  If you have any questions about medications and/or interactions please ask your doctor/PA or your pharmacist.      ICE AND ELEVATE INJURED/OPERATIVE EXTREMITY  Using ice and elevating the injured extremity above your heart can help with swelling and pain control.  Icing in a pulsatile fashion, such as 20 minutes on and 20 minutes off, can be followed.    Do not place ice directly on skin. Make sure there is  a barrier between to skin and the ice pack.    Using frozen items such as frozen peas works well as the conform nicely to the are that needs to be iced.  USE AN ACE WRAP OR TED HOSE FOR SWELLING CONTROL  In addition to icing and elevation, Ace wraps or TED hose are used to  help limit and resolve swelling.  It is recommended to use Ace wraps or TED hose until you are informed to stop.    When using Ace Wraps start the wrapping distally (farthest away from the body) and wrap proximally (closer to the body)   Example: If you had surgery on your leg or thing and you do not have a splint on, start the ace wrap at the toes and work your way up to the thigh        If you had surgery on your upper extremity and do not have a splint on, start the ace wrap at your fingers and work your way up to the upper arm   CALL THE OFFICE WITH ANY QUESTIONS OR CONCERNS: [phone]   VISIT OUR WEBSITE FOR ADDITIONAL INFORMATION: [email or web address]    Discharge Wound Care Instructions  Do NOT apply any ointments, solutions or lotions to pin sites or surgical wounds.  These prevent needed drainage and even though solutions like hydrogen peroxide kill bacteria, they also damage cells lining the pin sites that help fight infection.  Applying lotions or ointments can keep the wounds moist and can cause them to breakdown and open up as well. This can increase the risk for infection. When in doubt call the office.   If any drainage is noted, use foam dressing (mepilex) - These dressing supplies should be available at local medical supply stores [facility], [facility], etc) as well as Insurance claims handler ([employer], [facility], [employer], etc)  Once the incision is completely dry and without drainage, it may be left open to air out.  Showering may begin 36-48 hours later.  Cleaning gently with soap and water.

## 2023-04-05 NOTE — TOC Progression Note (Addendum)
Transition of Care [facility]) - Progression Note    Patient Details  Name: Sheena [name] [facility] MRN: [id number] Date of Birth: 30-Jun-1945  Transition of Care [facility]) CM/SW Contact  Lorri Frederick, LCSW Phone Number: 04/05/2023, 11:47 AM  Clinical Narrative:   Bed offer provided to pt and son Onalee Hua.  They are asking for a response from [facility].  CSW reached out to that facility.    1300: [facility] bed, CSW spoke with pt and she does want to accept offer.   SNF auth request submitted in [place] and approved: [id number], 3 days: 2/3-2/5.    1300: Message from [facility]: no available bed today, but can receive pt tomorrow.  MD informed.  Son Onalee Hua updated.   Expected Discharge Plan: Skilled Nursing Facility Barriers to Discharge: Continued Medical Work up  Expected Discharge Plan and Services   Discharge Planning Services: CM Consult   Living arrangements for the past 2 months: Single Family Home                             HH Agency: [facility] Date [facility] Agency Contacted: 04/01/23 Time HH Agency Contacted: 1037 Representative spoke with at [facility] Agency: Tresa Endo   Social Determinants of Health (SDOH) Interventions SDOH Screenings   Food Insecurity: No Food Insecurity (04/01/2023)  Housing: Low Risk  (04/01/2023)  Transportation Needs: No Transportation Needs (04/01/2023)  Utilities: Not At Risk (04/01/2023)  Depression (PHQ2-9): Low Risk  (03/17/2023)  Recent Concern: Depression (PHQ2-9) - Medium Risk (02/03/2023)  Financial Resource Strain: Low Risk  (03/17/2023)  Physical Activity: Inactive (03/17/2023)  Social Connections: Socially Isolated (04/01/2023)  Stress: No Stress Concern Present (03/17/2023)  Tobacco Use: Medium Risk (04/01/2023)  Health Literacy: Adequate Health Literacy (03/17/2023)    Readmission Risk Interventions    10/09/2022    2:31 PM  Readmission Risk Prevention Plan  Transportation Screening Complete  PCP or Specialist Appt within 3-5  Days Complete  HRI or Home Care Consult Complete  Social Work Consult for Recovery Care Planning/Counseling Complete  Palliative Care Screening Not Applicable  Medication Review [employer]) Complete

## 2023-04-05 NOTE — Progress Notes (Signed)
Physical Therapy Treatment Patient Details Name: Sheena [name] [facility] MRN: [id number] DOB: 1945-08-25 Today's Date: 04/05/2023   History of Present Illness 78 y/o female presenting to acute care s/p cephalomedullary nailing of right intertrochanteric femur fx on 1/31. PMH includes small cell lung cancer status post chemoradiation, afib, hypertension, hyperlipidemia, osteoporosis with prior fragility fracture, anxiety, heart murmur.    PT Comments  Patient willing to participate with therapy. Patient was lethargic and not as alert when asking questions, however once seated EOB, the patient's alertness improved immensely. Family present in the room during treatment session and was informed about the patient's mobility status and cognition following procedure. Patient required Max A +2 physical assistance for bed mobility transfer and sit > stand transfer (using the stedy). Patient was able to follow commands once upright and complied with tactile/verbal cueing during transfers. Patient was educated about the importance of improving mobility and the benefits of sitting in the recliner, instead of laying in bed. Patient will continue to benefit from acute care rehab in order to reach an optimal level of independence.    If plan is discharge home, recommend the following: Assistance with cooking/housework;Assist for transportation;Help with stairs or ramp for entrance;Two people to help with walking and/or transfers;A lot of help with bathing/dressing/bathroom   Can travel by private vehicle     No  Equipment Recommendations  BSC/3in1;Wheelchair (measurements PT);Wheelchair cushion (measurements PT)    Recommendations for Other Services OT consult     Precautions / Restrictions Precautions Precautions: Fall Restrictions Weight Bearing Restrictions Per Provider Order: Yes RLE Weight Bearing Per Provider Order: Weight bearing as tolerated     Mobility  Bed Mobility Overal bed mobility: Needs  Assistance Bed Mobility: Supine to Sit     Supine to sit: Max assist, +2 for physical assistance     General bed mobility comments: Minimal UE support to pull self to EOB, pt required additional assistance for trunk and movement of bilateral LEs    Transfers Overall transfer level: Needs assistance Equipment used: Ambulation equipment used Transfers: Sit to/from Stand, Bed to chair/wheelchair/BSC Sit to Stand: Max assist, +2 physical assistance           General transfer comment: Patient follows verbal and tactile cues for hand placement., generalized weakness to stand upright Transfer via Lift Equipment: Stedy  Ambulation/Gait                   Stairs             Wheelchair Mobility     Tilt Bed    Modified Rankin (Stroke Patients Only)       Balance Overall balance assessment: Needs assistance Sitting-balance support: Bilateral upper extremity supported, Feet supported Sitting balance-Leahy Scale: Fair   Postural control: Posterior lean, Right lateral lean Standing balance support: Bilateral upper extremity supported, Reliant on assistive device for balance Standing balance-Leahy Scale: Poor                              Cognition Arousal: Lethargic Behavior During Therapy: WFL for tasks assessed/performed Overall Cognitive Status: Impaired/Different from baseline                       Memory: Decreased short-term memory         General Comments: Once sitting EOB, patient's alertness improved        Exercises      General Comments General comments (  skin integrity, edema, etc.): O2: 88% (following  supine > sit transfer); administered 2L O2 nasal cannula      Pertinent Vitals/Pain Pain Assessment Pain Assessment: 0-10 Pain Score: 5  Pain Location: Right Hip Pain Descriptors / Indicators: Grimacing, Operative site guarding Pain Intervention(s): Limited activity within patient's tolerance, Monitored during  session    Home Living                          Prior Function            PT Goals (current goals can now be found in the care plan section) Acute Rehab PT Goals Patient Stated Goal: Wants to improve mobility PT Goal Formulation: With patient Time For Goal Achievement: 04/17/23 Potential to Achieve Goals: Good Progress towards PT goals: Progressing toward goals    Frequency    Min 1X/week      PT Plan      Co-evaluation              AM-PAC PT "6 Clicks" Mobility   Outcome Measure  Help needed turning from your back to your side while in a flat bed without using bedrails?: A Lot Help needed moving from lying on your back to sitting on the side of a flat bed without using bedrails?: A Lot Help needed moving to and from a bed to a chair (including a wheelchair)?: A Lot Help needed standing up from a chair using your arms (e.g., wheelchair or bedside chair)?: Total Help needed to walk in hospital room?: Total Help needed climbing 3-5 steps with a railing? : Total 6 Click Score: 9    End of Session Equipment Utilized During Treatment: Gait belt;Oxygen Activity Tolerance: Patient limited by lethargy;Patient limited by pain Patient left: in chair;with call bell/phone within reach;with chair alarm set;with family/visitor present Nurse Communication: Mobility status PT Visit Diagnosis: History of falling (Z91.81);Difficulty in walking, not elsewhere classified (R26.2);Pain;Unsteadiness on feet (R26.81) Pain - Right/Left: Right Pain - part of body: Leg     Time: 1610-9604 PT Time Calculation (min) (ACUTE ONLY): 34 min  Charges:    $Therapeutic Activity: 23-37 mins PT General Charges $$ ACUTE PT VISIT: 1 Visit                     Doreen Beam, SPT   Sheena [name] 04/05/2023, 1:47 PM

## 2023-04-05 NOTE — NC FL2 (Signed)
[place] MEDICAID [facility] LEVEL OF CARE FORM     IDENTIFICATION  Patient Name: Sheena [name] Birthdate: August 25, 1945 Sex: female Admission Date (Current Location): 03/31/2023  [place] and [place] Number:  Haynes Bast [id number] S Facility and Address:  [facility], [address] [place], [place] [address]      Provider Number: [id number]  Attending Physician Name and Address:  Narda Bonds, MD  Relative Name and Phone Number:  carty,tabitha Daughter   [phone]    Current Level of Care: Hospital Recommended Level of Care: Skilled Nursing Facility Prior Approval Number:    Date Approved/Denied:   PASRR Number: [id number]  Discharge Plan: SNF    Current Diagnoses: Patient Active Problem List   Diagnosis Date Noted   Hyperkalemia 04/02/2023   Acute blood loss anemia 04/01/2023   Closed intertrochanteric fracture of hip, right, initial encounter (HCC) - s/p ORIF 04-02-2023 with IM nail 03/31/2023   Traumatic rhabdomyolysis (HCC) 03/31/2023   AKI (acute kidney injury) (HCC) 03/31/2023   Macrocytic anemia 03/31/2023   DNR (do not resuscitate)/DNI(Do Not Intubate) 03/31/2023   History of lung cancer 12/16/2022   Lumbar vertebral fracture (HCC) 12/10/2022   PAF (paroxysmal atrial fibrillation) (HCC) - not on systemic anticoagulation due to hx of GI bleeding 09/10/2022   Hypokalemia 09/10/2022   Pancytopenia (HCC) 09/07/2022   Port-A-Cath in place 08/11/2022   Primary small cell carcinoma of lower lobe of left lung (HCC) 07/08/2022   Adenopathy 06/30/2022   Aortic atherosclerosis (HCC) 01/18/2020   Grade II diastolic dysfunction 03/06/2015   Hyperlipidemia 03/05/2014   Former smoker 02/19/2014   Disequilibrium 02/19/2014   History of vertebral compression fracture 01/04/2013   Chronic low back pain 12/13/2012   Sinusitis, chronic 09/03/2012   Essential hypertension, benign 09/03/2012   Ataxia 04/01/2011   Anxiety 04/01/2011   Blurred  vision 03/21/2010   Osteoporosis 03/21/2010   MURMUR 03/21/2010   Hyperglycemia 03/21/2010    Orientation RESPIRATION BLADDER Height & Weight     Self, Time, Situation, Place  O2 Continent Weight: 87 lb (39.5 kg) Height:  5' (152.4 cm)  BEHAVIORAL SYMPTOMS/MOOD NEUROLOGICAL BOWEL NUTRITION STATUS      Continent Diet (see discharge summary)  AMBULATORY STATUS COMMUNICATION OF NEEDS Skin   Total Care Verbally Skin abrasions, Surgical wounds                       Personal Care Assistance Level of Assistance  Bathing, Feeding, Dressing, Total care Bathing Assistance: Maximum assistance Feeding assistance: Limited assistance Dressing Assistance: Maximum assistance Total Care Assistance: Maximum assistance   Functional Limitations Info  Sight, Hearing, Speech Sight Info: Adequate Hearing Info: Adequate Speech Info: Adequate    SPECIAL CARE FACTORS FREQUENCY  PT (By licensed PT), OT (By licensed OT)     PT Frequency: 5x week OT Frequency: 5x week            Contractures Contractures Info: Not present    Additional Factors Info  Code Status, Allergies Code Status Info: DNR Allergies Info: Antihistamines, Diphenhydramine-type, Aspirin, Evista (Raloxifene)           Current Medications (04/05/2023):  This is the current hospital active medication list Current Facility-Administered Medications  Medication Dose Route Frequency Provider Last Rate Last Admin   0.9 %  sodium chloride infusion (Manually program via Guardrails IV Fluids)   Intravenous Once Thyra Breed A, PA-C       0.9 %  sodium chloride infusion (Manually program  via Guardrails IV Fluids)   Intravenous Once [name] Bali, PA-C       0.9 %  sodium chloride infusion   Intravenous Continuous [name] Bali, PA-C 40 mL/hr at 04/03/23 1834 New Bag at 04/03/23 1834   acetaminophen (TYLENOL) tablet 650 mg  650 mg Oral Q6H [name] Bali, PA-C   650 mg at 04/05/23 0501   amiodarone (PACERONE) tablet  200 mg  200 mg Oral Daily [name] Bali, PA-C   200 mg at 04/04/23 0849   amLODipine (NORVASC) tablet 5 mg  5 mg Oral Daily [name] Bali, PA-C   5 mg at 04/04/23 1610   aspirin EC tablet 81 mg  81 mg Oral Daily [name] Bali, PA-C   81 mg at 04/04/23 0848   Chlorhexidine Gluconate Cloth 2 % PADS 6 each  6 each Topical Daily [name] Bali, PA-C   6 each at 04/04/23 9604   cyanocobalamin (VITAMIN B12) tablet 1,000 mcg  1,000 mcg Oral Daily Narda Bonds, MD   1,000 mcg at 04/04/23 0849   diphenhydrAMINE (BENADRYL) injection 25 mg  25 mg Intravenous Q6H PRN [name] Bali, PA-C       docusate sodium (COLACE) capsule 100 mg  100 mg Oral BID [name] Bali, PA-C   100 mg at 04/04/23 2108   feeding supplement (ENSURE ENLIVE / ENSURE PLUS) liquid 237 mL  237 mL Oral BID BM [name] Bali, PA-C   237 mL at 04/04/23 1409   folic acid (FOLVITE) tablet 1 mg  1 mg Oral Daily [name] Bali, PA-C   1 mg at 04/04/23 5409   hydrALAZINE (APRESOLINE) tablet 10 mg  10 mg Oral Q6H PRN [name] Bali, PA-C       HYDROmorphone (DILAUDID) injection 0.5 mg  0.5 mg Intravenous Q4H PRN [name] Bali, PA-C   0.5 mg at 04/05/23 8119   melatonin tablet 10 mg  10 mg Oral QHS PRN [name] Bali, PA-C   10 mg at 04/04/23 2107   methocarbamol (ROBAXIN) tablet 500 mg  500 mg Oral Q6H PRN [name] Bali, PA-C   500 mg at 04/05/23 0501   Or   methocarbamol (ROBAXIN) injection 500 mg  500 mg Intravenous Q6H PRN [name] Bali, PA-C   500 mg at 04/04/23 0801   metoprolol tartrate (LOPRESSOR) tablet 12.5 mg  12.5 mg Oral BID [name] Bali, PA-C   12.5 mg at 04/04/23 1478   multivitamin with minerals tablet 1 tablet  1 tablet Oral Daily Narda Bonds, MD   1 tablet at 04/04/23 2956   oxyCODONE (Oxy IR/ROXICODONE) immediate release tablet 5 mg  5 mg Oral Q4H PRN [name] Bali, PA-C   5 mg at 04/05/23 0501   polyethylene glycol (MIRALAX / GLYCOLAX) packet 17 g  17 g Oral Daily PRN [name] Bali, PA-C       prochlorperazine (COMPAZINE) injection 10 mg  10 mg Intravenous Q6H PRN [name] Bali, PA-C   10 mg at 04/05/23 2130     Discharge Medications: Please see discharge summary for a list of discharge medications.  Relevant Imaging Results:  Relevant Lab Results:   Additional Information SSN: [id number]  Lorri Frederick, LCSW

## 2023-04-05 NOTE — Care Management Important Message (Signed)
Important Message  Patient Details  Name: Sheena [name] [facility] MRN: [id number] Date of Birth: Feb 21, 1946   Important Message Given:  Yes - Medicare IM     Dorena Bodo 04/05/2023, 3:06 PM

## 2023-04-05 NOTE — Plan of Care (Signed)
  Problem: Health Behavior/Discharge Planning: Goal: Ability to manage health-related needs will improve Outcome: Progressing   Problem: Activity: Goal: Risk for activity intolerance will decrease Outcome: Progressing   Problem: Nutrition: Goal: Adequate nutrition will be maintained Outcome: Progressing   

## 2023-04-05 NOTE — Progress Notes (Addendum)
PROGRESS NOTE    Sheena [name] [facility]  WUJ:[id number] DOB: 1945-07-18 DOA: 03/31/2023 PCP: [name], MD   Brief Narrative: Sheena [name] is a 78 y.o. female with a history of lung cancer, hypertension, paroxysmal atrial fibrillation not on anticoagulation, chronic low back pain.  Patient presented secondary to being found down in her apartment after a fall the day prior to admission with resultant right hip fracture.  Orthopedic surgery was consulted and performed an intramedullary nail placement on 1/31.  Hospitalization complicated by evidence of rhabdomyolysis on admission managed with IV fluids, in addition to severe anemia status post 2 units of PRBCs.   Assessment and Plan:  Closed intertrochanteric fracture of right hip Secondary to fall.  Orthopedic surgery performed intramedullary nail placement on 1/31.  Recommendations for weightbearing as tolerated.  PT/OT ordered with recommendations pending.  Acute blood loss anemia Secondary to fall and fracture. Hemoglobin down to 5.8 g/dL requiring 2 units of PRBC on 1/30. Posttransfusion hemoglobin of 11.9 with drift down to 9.1 g/dL. Hemoglobin stable.  AKI Baseline creatinine of about 1. Creatinine of 1.49 on admission which has improved with IV fluids.  Traumatic rhabdomyolysis Secondary to fall. CK mildly elevated at 742 on admission with peak to 803. Improved to 467; no significant/persistent renal failure.  Macrocytic anemia Baseline hemoglobin of about 9-10 g/dL. History of folate deficiency with low folate of 5.2 in August 2024. Folic acid started. Vitamin B12 is low-normal -Continue folic acid and vitamin B12 supplementation  Paroxysmal atrial fibrillation -Continue amiodarone and metoprolol  Primary small cell carcinoma of the lower lobe of left lung Noted.  Patient with a right-sided chest port in place.  Not accessed.  Hyperlipidemia Noted.  Patient is on Lipitor as an outpatient which was held on  admission.  Chronic low back pain Patient is on Lipitor as an outpatient which is held on admission secondary to rhabdomyolysis.  Primary hypertension Patient is on amlodipine and metoprolol as an outpatient. Low-normal blood pressure today -Hold amlodipine -Continue metoprolol  Underweight Estimated body mass index is 16.99 kg/m as calculated from the following:   Height as of this encounter: 5' (1.524 m).   Weight as of this encounter: 39.5 kg.    DVT prophylaxis: Per orthopedic surgery Code Status:   Code Status: Limited: Do not attempt resuscitation (DNR) -DNR-LIMITED -Do Not Intubate/DNI  Family Communication: None at bedside Disposition Plan: Medically stable for discharge.  Anticipate likely discharge to SNF.   Consultants:  Orthopedic surgery  Procedures:  Right ankle intramedullary nail placement  Antimicrobials: None   Subjective: No issues noted from overnight events.  Objective: BP (!) 98/58 (BP Location: Right Arm)   Pulse 95   Temp 98.4 F (36.9 C) (Oral)   Resp 18   Ht 5' (1.524 m)   Wt 39.5 kg   SpO2 96%   BMI 16.99 kg/m   Examination:  General exam: Appears calm and comfortable    Data Reviewed: I have personally reviewed following labs and imaging studies  CBC Lab Results  Component Value Date   WBC 8.7 04/04/2023   RBC 2.96 (L) 04/04/2023   HGB 9.9 (L) 04/04/2023   HCT 30.6 (L) 04/04/2023   MCV 103.4 (H) 04/04/2023   MCH 33.4 04/04/2023   PLT 103 (L) 04/04/2023   MCHC 32.4 04/04/2023   RDW 16.8 (H) 04/04/2023   LYMPHSABS 0.4 (L) 04/03/2023   MONOABS 0.8 04/03/2023   EOSABS 0.0 04/03/2023   BASOSABS 0.0 04/03/2023  Last metabolic panel Lab Results  Component Value Date   NA 133 (L) 04/03/2023   K 4.9 04/03/2023   CL 101 04/03/2023   CO2 21 (L) 04/03/2023   BUN 23 04/03/2023   CREATININE 0.93 04/03/2023   GLUCOSE 140 (H) 04/03/2023   GFRNONAA >60 04/03/2023   GFRAA 41 (L) 01/18/2020   CALCIUM 8.6 (L) 04/03/2023    PHOS 4.2 09/14/2022   PROT 5.6 (L) 04/03/2023   ALBUMIN 3.1 (L) 04/03/2023   BILITOT 0.5 04/03/2023   ALKPHOS 38 04/03/2023   AST 22 04/03/2023   ALT 15 04/03/2023   ANIONGAP 11 04/03/2023    GFR: Estimated Creatinine Clearance: 32.6 mL/min (by C-G formula based on SCr of 0.93 mg/dL).  Recent Results (from the past 240 hours)  Surgical pcr screen     Status: None   Collection Time: 03/31/23  3:49 PM   Specimen: Nasal Mucosa; Nasal Swab  Result Value Ref Range Status   MRSA, PCR NEGATIVE NEGATIVE Final   Staphylococcus aureus NEGATIVE NEGATIVE Final    Comment: (NOTE) The Xpert SA Assay (FDA approved for NASAL specimens in patients 10 years of age and older), is one component of a comprehensive surveillance program. It is not intended to diagnose infection nor to guide or monitor treatment. Performed at [facility], [address] [place], [place] [address]       Radiology Studies: No results found.     LOS: 5 days    Jacquelin Hawking, MD [facility] 04/05/2023, 9:02 AM   If 7PM-7AM, please contact night-coverage [email or web address]

## 2023-04-06 ENCOUNTER — Inpatient Hospital Stay (HOSPITAL_COMMUNITY): Payer: 59

## 2023-04-06 DIAGNOSIS — M6281 Muscle weakness (generalized): Secondary | ICD-10-CM | POA: Diagnosis not present

## 2023-04-06 DIAGNOSIS — R1310 Dysphagia, unspecified: Secondary | ICD-10-CM | POA: Diagnosis present

## 2023-04-06 DIAGNOSIS — J9621 Acute and chronic respiratory failure with hypoxia: Secondary | ICD-10-CM | POA: Diagnosis present

## 2023-04-06 DIAGNOSIS — G9341 Metabolic encephalopathy: Secondary | ICD-10-CM | POA: Diagnosis present

## 2023-04-06 DIAGNOSIS — D638 Anemia in other chronic diseases classified elsewhere: Secondary | ICD-10-CM | POA: Diagnosis present

## 2023-04-06 DIAGNOSIS — Z741 Need for assistance with personal care: Secondary | ICD-10-CM | POA: Diagnosis not present

## 2023-04-06 DIAGNOSIS — E785 Hyperlipidemia, unspecified: Secondary | ICD-10-CM | POA: Diagnosis present

## 2023-04-06 DIAGNOSIS — R531 Weakness: Secondary | ICD-10-CM | POA: Diagnosis not present

## 2023-04-06 DIAGNOSIS — M79671 Pain in right foot: Secondary | ICD-10-CM | POA: Diagnosis not present

## 2023-04-06 DIAGNOSIS — I4891 Unspecified atrial fibrillation: Secondary | ICD-10-CM | POA: Diagnosis not present

## 2023-04-06 DIAGNOSIS — Z743 Need for continuous supervision: Secondary | ICD-10-CM | POA: Diagnosis not present

## 2023-04-06 DIAGNOSIS — C3432 Malignant neoplasm of lower lobe, left bronchus or lung: Secondary | ICD-10-CM | POA: Diagnosis present

## 2023-04-06 DIAGNOSIS — J440 Chronic obstructive pulmonary disease with acute lower respiratory infection: Secondary | ICD-10-CM | POA: Diagnosis present

## 2023-04-06 DIAGNOSIS — R627 Adult failure to thrive: Secondary | ICD-10-CM | POA: Diagnosis present

## 2023-04-06 DIAGNOSIS — Z7982 Long term (current) use of aspirin: Secondary | ICD-10-CM | POA: Diagnosis not present

## 2023-04-06 DIAGNOSIS — R2689 Other abnormalities of gait and mobility: Secondary | ICD-10-CM | POA: Diagnosis not present

## 2023-04-06 DIAGNOSIS — R6520 Severe sepsis without septic shock: Secondary | ICD-10-CM | POA: Diagnosis not present

## 2023-04-06 DIAGNOSIS — M6259 Muscle wasting and atrophy, not elsewhere classified, multiple sites: Secondary | ICD-10-CM | POA: Diagnosis not present

## 2023-04-06 DIAGNOSIS — D62 Acute posthemorrhagic anemia: Secondary | ICD-10-CM | POA: Diagnosis not present

## 2023-04-06 DIAGNOSIS — C3431 Malignant neoplasm of lower lobe, right bronchus or lung: Secondary | ICD-10-CM | POA: Diagnosis not present

## 2023-04-06 DIAGNOSIS — R059 Cough, unspecified: Secondary | ICD-10-CM | POA: Diagnosis not present

## 2023-04-06 DIAGNOSIS — F419 Anxiety disorder, unspecified: Secondary | ICD-10-CM | POA: Diagnosis present

## 2023-04-06 DIAGNOSIS — K5909 Other constipation: Secondary | ICD-10-CM | POA: Diagnosis not present

## 2023-04-06 DIAGNOSIS — Z1152 Encounter for screening for COVID-19: Secondary | ICD-10-CM | POA: Diagnosis not present

## 2023-04-06 DIAGNOSIS — R008 Other abnormalities of heart beat: Secondary | ICD-10-CM | POA: Diagnosis not present

## 2023-04-06 DIAGNOSIS — R64 Cachexia: Secondary | ICD-10-CM | POA: Diagnosis present

## 2023-04-06 DIAGNOSIS — J449 Chronic obstructive pulmonary disease, unspecified: Secondary | ICD-10-CM | POA: Diagnosis not present

## 2023-04-06 DIAGNOSIS — J9811 Atelectasis: Secondary | ICD-10-CM | POA: Diagnosis present

## 2023-04-06 DIAGNOSIS — R471 Dysarthria and anarthria: Secondary | ICD-10-CM | POA: Diagnosis present

## 2023-04-06 DIAGNOSIS — R0602 Shortness of breath: Secondary | ICD-10-CM | POA: Diagnosis not present

## 2023-04-06 DIAGNOSIS — R9389 Abnormal findings on diagnostic imaging of other specified body structures: Secondary | ICD-10-CM | POA: Diagnosis not present

## 2023-04-06 DIAGNOSIS — D539 Nutritional anemia, unspecified: Secondary | ICD-10-CM | POA: Diagnosis present

## 2023-04-06 DIAGNOSIS — C3490 Malignant neoplasm of unspecified part of unspecified bronchus or lung: Secondary | ICD-10-CM | POA: Diagnosis not present

## 2023-04-06 DIAGNOSIS — R918 Other nonspecific abnormal finding of lung field: Secondary | ICD-10-CM | POA: Diagnosis not present

## 2023-04-06 DIAGNOSIS — J90 Pleural effusion, not elsewhere classified: Secondary | ICD-10-CM | POA: Diagnosis present

## 2023-04-06 DIAGNOSIS — Z681 Body mass index (BMI) 19 or less, adult: Secondary | ICD-10-CM | POA: Diagnosis not present

## 2023-04-06 DIAGNOSIS — Z66 Do not resuscitate: Secondary | ICD-10-CM | POA: Diagnosis present

## 2023-04-06 DIAGNOSIS — I480 Paroxysmal atrial fibrillation: Secondary | ICD-10-CM | POA: Diagnosis present

## 2023-04-06 DIAGNOSIS — Y95 Nosocomial condition: Secondary | ICD-10-CM | POA: Diagnosis present

## 2023-04-06 DIAGNOSIS — Z515 Encounter for palliative care: Secondary | ICD-10-CM | POA: Diagnosis not present

## 2023-04-06 DIAGNOSIS — R0902 Hypoxemia: Secondary | ICD-10-CM | POA: Diagnosis not present

## 2023-04-06 DIAGNOSIS — Z85118 Personal history of other malignant neoplasm of bronchus and lung: Secondary | ICD-10-CM | POA: Diagnosis not present

## 2023-04-06 DIAGNOSIS — J189 Pneumonia, unspecified organism: Secondary | ICD-10-CM | POA: Diagnosis present

## 2023-04-06 DIAGNOSIS — B379 Candidiasis, unspecified: Secondary | ICD-10-CM | POA: Diagnosis not present

## 2023-04-06 DIAGNOSIS — S72141D Displaced intertrochanteric fracture of right femur, subsequent encounter for closed fracture with routine healing: Secondary | ICD-10-CM | POA: Diagnosis not present

## 2023-04-06 DIAGNOSIS — A419 Sepsis, unspecified organism: Secondary | ICD-10-CM | POA: Diagnosis not present

## 2023-04-06 DIAGNOSIS — I10 Essential (primary) hypertension: Secondary | ICD-10-CM | POA: Diagnosis present

## 2023-04-06 DIAGNOSIS — R6889 Other general symptoms and signs: Secondary | ICD-10-CM | POA: Diagnosis not present

## 2023-04-06 DIAGNOSIS — Z9181 History of falling: Secondary | ICD-10-CM | POA: Diagnosis not present

## 2023-04-06 DIAGNOSIS — N170 Acute kidney failure with tubular necrosis: Secondary | ICD-10-CM | POA: Diagnosis not present

## 2023-04-06 DIAGNOSIS — T796XXA Traumatic ischemia of muscle, initial encounter: Secondary | ICD-10-CM | POA: Diagnosis not present

## 2023-04-06 DIAGNOSIS — J9601 Acute respiratory failure with hypoxia: Secondary | ICD-10-CM | POA: Diagnosis not present

## 2023-04-06 DIAGNOSIS — S72141A Displaced intertrochanteric fracture of right femur, initial encounter for closed fracture: Secondary | ICD-10-CM | POA: Diagnosis not present

## 2023-04-06 DIAGNOSIS — E43 Unspecified severe protein-calorie malnutrition: Secondary | ICD-10-CM | POA: Diagnosis present

## 2023-04-06 DIAGNOSIS — J9611 Chronic respiratory failure with hypoxia: Secondary | ICD-10-CM | POA: Diagnosis not present

## 2023-04-06 MED ORDER — DOCUSATE SODIUM 100 MG PO CAPS: 100.0000 mg | ORAL_CAPSULE | Freq: Two times a day (BID) | ORAL | Status: DC

## 2023-04-06 MED ORDER — FOLIC ACID 1 MG PO TABS: 1.0000 mg | ORAL_TABLET | Freq: Every day | ORAL | Status: AC

## 2023-04-06 MED ORDER — ENSURE ENLIVE PO LIQD: 237.0000 mL | Freq: Two times a day (BID) | ORAL | Status: DC

## 2023-04-06 MED ORDER — CYANOCOBALAMIN 1000 MCG PO TABS: 1000.0000 ug | ORAL_TABLET | Freq: Every day | ORAL | Status: AC

## 2023-04-06 NOTE — Discharge Summary (Signed)
 Physician Discharge Summary   Patient: Sheena Sheena [name] MRN: [id number] DOB: [birth year]  Admit date:     03/31/2023  Discharge date: 04/06/23  Discharge Physician: Elgin Lam, MD   PCP: Sheena Garnette KIDD, MD   Recommendations at discharge:  PCP visit for Sheena [name] follow-up Orthopedic surgery follow-up in 2 weeks for wound check and repeat x-ray   Discharge Diagnoses: Principal Problem:   Closed intertrochanteric fracture of hip, right, initial encounter [facility] Sheena [name]) - s/p ORIF 04-02-2023 with IM nail Active Problems:   Acute blood loss anemia   Traumatic rhabdomyolysis (HCC)   AKI (acute kidney injury) (HCC)   Essential hypertension, benign   Chronic low back pain   Hyperlipidemia   Primary small cell carcinoma of lower lobe of left lung (HCC)   PAF (paroxysmal atrial fibrillation) (HCC) - not on systemic anticoagulation due to hx of GI bleeding   Macrocytic anemia   DNR (do not resuscitate)/DNI(Do Not Intubate)   Hyperkalemia  Resolved Problems:   * No resolved Sheena [name] problems. *  Sheena [name] Course: Sheena Sheena [name] is a 78 y.o. female with a history of lung cancer, hypertension, paroxysmal atrial fibrillation not on anticoagulation, chronic low back pain.  Patient presented secondary to being found down in her apartment after a fall the day prior to admission with resultant right hip fracture.  Orthopedic surgery was consulted and performed an intramedullary nail placement on 1/31.  Hospitalization complicated by evidence of rhabdomyolysis on admission managed with IV fluids, in addition to severe anemia status post 2 units of PRBCs. Hemoglobin stable post-operatively.  Assessment and Plan:  Closed intertrochanteric fracture of right hip Secondary to fall.  Orthopedic surgery performed intramedullary nail placement on 1/31.  Recommendations for weightbearing as tolerated.  PT/OT ordered with recommendations for SNF.   Acute blood loss anemia Secondary to fall and  fracture. Hemoglobin down to 5.8 g/dL requiring 2 units of PRBC on 1/30. Posttransfusion hemoglobin of 11.9 with drift down to 9.1 g/dL. Hemoglobin stable.   AKI Baseline creatinine of about 1. Creatinine of 1.49 on admission which has improved with IV fluids.   Traumatic rhabdomyolysis Secondary to fall. CK mildly elevated at 742 on admission with peak to 803. Improved to 467; no significant/persistent renal failure.   Macrocytic anemia Baseline hemoglobin of about 9-10 g/dL. History of folate deficiency with low folate of 5.2 in August 2024. Folic acid  started. Vitamin B12 is low-normal. Continue folic acid  and vitamin B12 supplementation.   Paroxysmal atrial fibrillation Continue amiodarone  and metoprolol    Primary small cell carcinoma of the lower lobe of left lung Noted.  Patient with a right-sided chest port in place.  Not accessed.   Hyperlipidemia Noted.  Patient is on Lipitor as an outpatient which was held on admission.   Chronic low back pain Patient is on Lipitor as an outpatient which is held on admission secondary to rhabdomyolysis.   Primary hypertension Patient is on amlodipine  and metoprolol  as an outpatient. Low-normal blood pressure during hospitalization. Hold amlodipine  on discharge until blood pressure is consistently elevated. Continue metoprolol .  Right foot pain X-ray negative for fracture. Continue analgesics.   Underweight Discharge with protein and vitamin supplements. Estimated body mass index is 16.99 kg/m as calculated from the following:   Height as of this encounter: 5' (1.524 m).   Weight as of this encounter: 39.5 kg.   Consultants:  Orthopedic surgery   Procedures:  Right ankle intramedullary nail placement  Disposition: Skilled nursing facility Diet recommendation: Regular diet   DISCHARGE  MEDICATION: Allergies as of 04/06/2023       Reactions   Antihistamines, Diphenhydramine -type Other (See Comments)   palpitations, feel jittery    Aspirin  Other (See Comments)   Stomach cramps   Evista [raloxifene] Other (See Comments)   Hot Flashes         Medication List     PAUSE taking these medications    amLODipine  5 MG tablet Wait to take this until your doctor or other care provider tells you to start again. Commonly known as: NORVASC  Take 5 mg by mouth daily.       STOP taking these medications    famotidine  20 MG tablet Commonly known as: PEPCID    Magnesium  Citrate 125 MG Caps   mirtazapine  7.5 MG tablet Commonly known as: REMERON    sucralfate  1 GM/10ML suspension Commonly known as: Carafate    traMADol  50 MG tablet Commonly known as: ULTRAM        TAKE these medications    acetaminophen  500 MG tablet Commonly known as: TYLENOL  Take 1,000 mg by mouth 2 (two) times daily as needed for mild pain.   amiodarone  200 MG tablet Commonly known as: PACERONE  Take 1 tablet (200 mg total) by mouth daily.   aspirin  EC 81 MG tablet Take 1 tablet (81 mg total) by mouth daily. Swallow whole.   atorvastatin  20 MG tablet Commonly known as: LIPITOR TAKE 1 TABLET BY MOUTH EVERY DAY   CALCIUM  CITRATE PETITE/VIT D PO Take 1 tablet by mouth 2 (two) times daily.   cyanocobalamin  1000 MCG tablet Take 1 tablet (1,000 mcg total) by mouth daily. Start taking on: April 07, 2023   docusate sodium  100 MG capsule Commonly known as: COLACE Take 1 capsule (100 mg total) by mouth 2 (two) times daily.   feeding supplement Liqd Take 237 mLs by mouth 2 (two) times daily between meals.   folic acid  1 MG tablet Commonly known as: FOLVITE  Take 1 tablet (1 mg total) by mouth daily. Start taking on: April 07, 2023   lidocaine -prilocaine  cream Commonly known as: EMLA  Apply 1 Application topically as needed.   magnesium  oxide 400 MG tablet Commonly known as: MAG-OX Take 1 tablet by mouth daily.   methocarbamol  500 MG tablet Commonly known as: ROBAXIN  Take 1 tablet (500 mg total) by mouth every 6 (six)  hours as needed for muscle spasms.   metoprolol  tartrate 25 MG tablet Commonly known as: LOPRESSOR  Take 12.5 mg by mouth 2 (two) times daily.   oxyCODONE  5 MG immediate release tablet Commonly known as: Oxy IR/ROXICODONE  Take 1 tablet (5 mg total) by mouth every 4 (four) hours as needed for severe pain (pain score 7-10).   OYSTER CALCIUM  500 + D 500-200 MG-UNIT tablet Take 1 tablet by mouth 2 (two) times daily.   polyethylene glycol 17 g packet Commonly known as: MIRALAX  / GLYCOLAX  Take 17 g by mouth daily as needed for mild constipation.   PRESCRIPTION MEDICATION Apply 1 application  topically in the morning and at bedtime. Unknown cream given to Pt from the cancer center for after radiation   prochlorperazine  10 MG tablet Commonly known as: COMPAZINE  Take 1 tablet (10 mg total) by mouth every 6 (six) hours as needed for nausea or vomiting.        Contact information for follow-up providers     Haddix, Franky SQUIBB, MD. Schedule an appointment as soon as possible for a visit in 2 week(s).   Specialty: Orthopedic Surgery Why: for wound check and repeat x-rays  Contact information: [address] [place] [address] [phone]         Sheena Garnette KIDD, MD. Schedule an appointment as soon as possible for a visit.   Specialty: Family Medicine Why: For Sheena [name] follow-up Contact information: [address] [place] [address] [phone]              Contact information for after-discharge care     Destination     [facility] Sheena [name] Preferred SNF .   Service: Skilled Nursing Contact information: [address] [place]  [phone]                    Discharge Exam: BP 123/75 (BP Location: Right Arm)   Pulse 86   Temp 98.3 F (36.8 C) (Oral)   Resp 20   Ht 5' (1.524 m)   Wt 39.5 kg   SpO2 96%   BMI 16.99 kg/m   General exam: Appears anxious and comfortable Respiratory  system: Clear to auscultation. Respiratory effort normal. Cardiovascular system: S1 & S2 heard, RRR Gastrointestinal system: Abdomen is nondistended, soft and nontender. No organomegaly or masses felt. Normal bowel sounds heard. Central nervous system: Alert and oriented. No focal neurological deficits. Musculoskeletal: Right foot without visual deformity. No pinpoint tenderness. No edema.   Condition at discharge: stable  The results of significant diagnostics from this hospitalization (including imaging, microbiology, ancillary and laboratory) are listed below for reference.   Imaging Studies: DG Foot Complete Right Result Date: 04/06/2023 CLINICAL DATA:  RIGHT foot pain EXAM: RIGHT FOOT COMPLETE - 3+ VIEW COMPARISON:  None Available. FINDINGS: No fracture or dislocation of mid foot or forefoot. The phalanges are normal. The calcaneus is normal. No soft tissue abnormality. IMPRESSION: No fracture or dislocation. Electronically Signed   By: Jackquline Boxer M.D.   On: 04/06/2023 09:03   DG HIP PORT UNILAT W OR W/O PELVIS 1V RIGHT Result Date: 04/02/2023 CLINICAL DATA:  Status postoperative fixation of a right hip intertrochanteric fracture. EXAM: DG HIP (WITH OR WITHOUT PELVIS) 1V PORT RIGHT COMPARISON:  03/31/2023 and earlier today. FINDINGS: Interval intramedullary rod and compression screw fixation of the previously demonstrated comminuted intertrochanteric fracture on the right. Essentially anatomic position and alignment of the major fragments. Diffuse osteopenia. No new fractures. IMPRESSION: Status post ORIF of a right hip intertrochanteric fracture. Electronically Signed   By: Elspeth Bathe M.D.   On: 04/02/2023 19:31   DG HIP UNILAT WITH PELVIS 2-3 VIEWS RIGHT Result Date: 04/02/2023 CLINICAL DATA:  Elective surgery. Right hip intraoperative fluoroscopy. EXAM: DG HIP (WITH OR WITHOUT PELVIS) 2-3V RIGHT COMPARISON:  AP pelvis 03/31/2023 FINDINGS: Images were performed intraoperatively  without the presence of a radiologist. The patient is undergoing cephalomedullary nail fixation of the previously seen intertrochanteric fracture of the proximal right femur. No hardware complication is seen. Improved alignment. Total fluoroscopy images: 6 Total fluoroscopy time: 65 seconds Total dose: Radiation Exposure Index (as provided by the fluoroscopic device): 4.35 mGy air Kerma Please see intraoperative findings for further detail. IMPRESSION: Intraoperative fluoroscopy for cephalomedullary nail fixation of the proximal right femur. Electronically Signed   By: Tanda Lyons M.D.   On: 04/02/2023 15:43   DG C-Arm 1-60 Min-No Report Result Date: 04/02/2023 Fluoroscopy was utilized by the requesting physician.  No radiographic interpretation.   DG Knee Right Port Result Date: 03/31/2023 CLINICAL DATA:  Fall.  Hip fracture. EXAM: PORTABLE RIGHT KNEE - 1-2 VIEW COMPARISON:  None Available. FINDINGS:  There is diffuse osteopenia of the visualized osseous structures. No acute fracture or dislocation. No aggressive osseous lesion. No significant degenerative changes. No knee effusion or focal soft tissue swelling. No radiopaque foreign bodies. IMPRESSION: *No acute osseous abnormality of the right knee. Electronically Signed   By: Ree Molt M.D.   On: 03/31/2023 16:38   CT Head Wo Contrast Result Date: 03/31/2023 CLINICAL DATA:  Head trauma, moderate-severe; Neck trauma (Age >= 65y). Fall last night. Forehead laceration. EXAM: CT HEAD WITHOUT CONTRAST CT CERVICAL SPINE WITHOUT CONTRAST TECHNIQUE: Multidetector CT imaging of the head and cervical spine was performed following the standard protocol without intravenous contrast. Multiplanar CT image reconstructions of the cervical spine were also generated. RADIATION DOSE REDUCTION: This exam was performed according to the departmental dose-optimization program which includes automated exposure control, adjustment of the mA and/or kV according to patient  size and/or use of iterative reconstruction technique. COMPARISON:  Head MRI 01/30/2023 FINDINGS: CT HEAD FINDINGS Brain: There is no evidence of an acute infarct, intracranial hemorrhage, mass, midline shift, or extra-axial fluid collection. There is mild generalized cerebral atrophy. Cerebral white matter hypodensities are nonspecific but compatible with mild chronic small vessel ischemic disease. Vascular: Calcified atherosclerosis at the skull base. No hyperdense vessel. Skull: No acute fracture or destructive lesion. Sinuses/Orbits: The included paranasal sinuses and mastoid air cells are largely clear. Unremarkable orbits. Other: None. CT CERVICAL SPINE FINDINGS Alignment: Cervical spine straightening. Trace anterolisthesis of C7 on T1, likely degenerative. Skull base and vertebrae: No acute fracture or suspicious osseous lesion. Moderate atlantodental degenerative changes. Soft tissues and spinal canal: No prevertebral fluid or swelling. No visible canal hematoma. Disc levels: Very mild disc degeneration for age. Up to moderate multilevel facet arthrosis. Upper chest: Emphysema. Other: Calcified atherosclerosis about the left greater than right carotid bifurcations. Partially visualized right internal jugular venous catheter. IMPRESSION: 1. No evidence of acute intracranial abnormality or cervical spine fracture. 2. Mild chronic small vessel ischemic disease. Electronically Signed   By: Dasie Hamburg M.D.   On: 03/31/2023 14:34   CT Cervical Spine Wo Contrast Result Date: 03/31/2023 CLINICAL DATA:  Head trauma, moderate-severe; Neck trauma (Age >= 65y). Fall last night. Forehead laceration. EXAM: CT HEAD WITHOUT CONTRAST CT CERVICAL SPINE WITHOUT CONTRAST TECHNIQUE: Multidetector CT imaging of the head and cervical spine was performed following the standard protocol without intravenous contrast. Multiplanar CT image reconstructions of the cervical spine were also generated. RADIATION DOSE REDUCTION: This  exam was performed according to the departmental dose-optimization program which includes automated exposure control, adjustment of the mA and/or kV according to patient size and/or use of iterative reconstruction technique. COMPARISON:  Head MRI 01/30/2023 FINDINGS: CT HEAD FINDINGS Brain: There is no evidence of an acute infarct, intracranial hemorrhage, mass, midline shift, or extra-axial fluid collection. There is mild generalized cerebral atrophy. Cerebral white matter hypodensities are nonspecific but compatible with mild chronic small vessel ischemic disease. Vascular: Calcified atherosclerosis at the skull base. No hyperdense vessel. Skull: No acute fracture or destructive lesion. Sinuses/Orbits: The included paranasal sinuses and mastoid air cells are largely clear. Unremarkable orbits. Other: None. CT CERVICAL SPINE FINDINGS Alignment: Cervical spine straightening. Trace anterolisthesis of C7 on T1, likely degenerative. Skull base and vertebrae: No acute fracture or suspicious osseous lesion. Moderate atlantodental degenerative changes. Soft tissues and spinal canal: No prevertebral fluid or swelling. No visible canal hematoma. Disc levels: Very mild disc degeneration for age. Up to moderate multilevel facet arthrosis. Upper chest: Emphysema. Other: Calcified atherosclerosis about the  left greater than right carotid bifurcations. Partially visualized right internal jugular venous catheter. IMPRESSION: 1. No evidence of acute intracranial abnormality or cervical spine fracture. 2. Mild chronic small vessel ischemic disease. Electronically Signed   By: Dasie Hamburg M.D.   On: 03/31/2023 14:34   DG Pelvis Portable Result Date: 03/31/2023 CLINICAL DATA:  Fall, right hip pain EXAM: PORTABLE PELVIS 1-2 VIEWS COMPARISON:  12/09/2022 FINDINGS: Acute, comminuted intertrochanteric fracture of the proximal right femur with approximately 90 degrees of varus angulation. Hip joint intact without dislocation. Bony  pelvis intact without fracture or diastasis. Soft tissue swelling is evident the lateral aspect of the right hip. IMPRESSION: Acute, comminuted intertrochanteric fracture of the proximal right femur with varus angulation. Electronically Signed   By: Mabel Converse D.O.   On: 03/31/2023 14:23   DG Chest Portable 1 View Result Date: 03/31/2023 CLINICAL DATA:  Fall and right hip fracture. EXAM: PORTABLE CHEST 1 VIEW COMPARISON:  Chest radiograph dated 12/20/2022. FINDINGS: Right-sided Port-A-Cath in similar position. The background of emphysema. No focal consolidation, pleural effusion, or pneumothorax. The cardiac silhouette is within normal limits. Atherosclerotic calcification of the aorta. Lower thoracic vertebroplasty. No acute osseous pathology. IMPRESSION: 1. No active disease. 2. Emphysema. Electronically Signed   By: Vanetta Chou M.D.   On: 03/31/2023 14:22    Microbiology: Results for orders placed or performed during the Sheena [name] encounter of 03/31/23  Surgical pcr screen     Status: None   Collection Time: 03/31/23  3:49 PM   Specimen: Nasal Mucosa; Nasal Swab  Result Value Ref Range Status   MRSA, PCR NEGATIVE NEGATIVE Final   Staphylococcus aureus NEGATIVE NEGATIVE Final    Comment: (NOTE) The Xpert SA Assay (FDA approved for NASAL specimens in patients 39 years of age and older), is one component of a comprehensive surveillance program. It is not intended to diagnose infection nor to guide or monitor treatment. Performed at [facility], [address] [place], [place] [address]     Labs: CBC: Recent Labs  Lab 04/01/23 0424 04/01/23 0527 04/02/23 0558 04/02/23 1030 04/02/23 1625 04/03/23 0515 04/04/23 2030  WBC 9.7  --  11.1*  --  10.0 9.4 8.7  NEUTROABS 8.8*  --  9.1*  --   --  8.2*  --   HGB 6.3*   < > 10.7* 10.9* 9.8* 9.1* 9.9*  HCT 20.0*   < > 31.1*  31.8* 32.0* 29.3* 27.7* 30.6*  MCV 109.3*  --  96.6  --  99.3 101.1* 103.4*  PLT 107*  --  102*   --  92* 89* 103*   < > = values in this interval not displayed.   Basic Metabolic Panel: Recent Labs  Lab 03/31/23 1252 03/31/23 1309 04/01/23 0424 04/01/23 0543 04/01/23 1508 04/02/23 0558 04/02/23 1030 04/03/23 0515  NA 139   < > 137 139 138 134* 137 133*  K 4.7   < > 4.7 4.9 5.4* 5.2* 4.8 4.9  CL 101   < > 106  --  106 104 105 101  CO2 20*  --  21*  --   --  21*  --  21*  GLUCOSE 177*   < > 131*  --  108* 108* 95 140*  BUN 36*   < > 28*  --  31* 29* 29* 23  CREATININE 1.49*   < > 1.03*  --  1.00 1.02* 1.00 0.93  CALCIUM  9.8  --  8.0*  --   --  8.3*  --  8.6*  MG  --   --  1.9  --   --   --   --  1.9   < > = values in this interval not displayed.   Liver Function Tests: Recent Labs  Lab 03/31/23 1252 04/01/23 0424 04/02/23 0558 04/03/23 0515  AST 42* 27 28 22   ALT 18 16 18 15   ALKPHOS 52 34* 39 38  BILITOT 0.5 0.6 1.2 0.5  PROT 7.3 5.5* 5.8* 5.6*  ALBUMIN  3.9 3.2* 3.1* 3.1*    Discharge time spent: 35 minutes.  Signed: Elgin Lam, MD [facility] 04/06/2023

## 2023-04-06 NOTE — Progress Notes (Signed)
 Physical Therapy Treatment Patient Details Name: Sheena [name] MRN: [id number] DOB: 1945-03-15 Today's Date: 04/06/2023   History of Present Illness 78 y/o female presenting to acute care s/p cephalomedullary nailing of right intertrochanteric femur fx on 1/31. PMH includes small cell lung cancer status post chemoradiation, afib, hypertension, hyperlipidemia, osteoporosis with prior fragility fracture, anxiety, heart murmur.    PT Comments  Patient willing to participate with therapy. Patient required Max A +2 physical assistance for bed mobility transfer and sit > stand transfer (using the stedy). Patient was able to follow commands once upright and complied with tactile/verbal cueing during transfers. Patient demonstrated some lateral weight shifts, however required constant cueing to maintain an upright posture. Performed some sit > stands from stedy, patient used UE support from the stedy to pull herself to an upright position. Patient was educated about the importance of improving mobility and the benefits of sitting in the recliner, instead of laying in bed. Patient will continue to benefit from acute care rehab in order to reach an optimal level of independence.    If plan is discharge home, recommend the following: Assistance with cooking/housework;Assist for transportation;Help with stairs or ramp for entrance;Two people to help with walking and/or transfers;A lot of help with bathing/dressing/bathroom   Can travel by private vehicle     No  Equipment Recommendations  BSC/3in1;Wheelchair (measurements PT);Wheelchair cushion (measurements PT)    Recommendations for Other Services       Precautions / Restrictions Precautions Precautions: Fall Restrictions Weight Bearing Restrictions Per Provider Order: Yes RLE Weight Bearing Per Provider Order: Weight bearing as tolerated     Mobility  Bed Mobility Overal bed mobility: Needs Assistance Bed Mobility: Supine to Sit      Supine to sit: Max assist, +2 for physical assistance     General bed mobility comments: Minimal UE support to pull self to EOB, pt required additional assistance for trunk and movement of bilateral LEs    Transfers Overall transfer level: Needs assistance Equipment used: Ambulation equipment used Transfers: Sit to/from Stand Sit to Stand: Max assist, +2 physical assistance           General transfer comment: Patient follows verbal and tactile cues for hand placement., generalized weakness to stand upright Transfer via Lift Equipment: Stedy  Ambulation/Gait                   Stairs             Wheelchair Mobility     Tilt Bed    Modified Rankin (Stroke Patients Only)       Balance Overall balance assessment: Needs assistance Sitting-balance support: Bilateral upper extremity supported, Feet supported Sitting balance-Leahy Scale: Fair   Postural control: Posterior lean, Right lateral lean Standing balance support: Bilateral upper extremity supported, Reliant on assistive device for balance Standing balance-Leahy Scale: Poor                              Cognition Arousal: Alert Behavior During Therapy: WFL for tasks assessed/performed Overall Cognitive Status: Impaired/Different from baseline                   Orientation Level: Disoriented to, Place, Time, Situation   Memory: Decreased short-term memory Following Commands: Follows one step commands with increased time   Awareness: Intellectual            Exercises Other Exercises Other Exercises: Sit to Stands (from Cucumber)  x3 Other Exercises: Weight Shifts (Laterally) x8    General Comments General comments (skin integrity, edema, etc.): BP: 151/70; O2%: 88-95%; Pulse:88      Pertinent Vitals/Pain Pain Assessment Pain Assessment: Faces Faces Pain Scale: Hurts a little bit Pain Location: Right Hip Pain Descriptors / Indicators: Grimacing, Operative site  guarding Pain Intervention(s): Limited activity within patient's tolerance, Monitored during session    Home Living                          Prior Function            PT Goals (current goals can now be found in the care plan section) Acute Rehab PT Goals PT Goal Formulation: With patient Time For Goal Achievement: 04/17/23 Potential to Achieve Goals: Good Progress towards PT goals: Progressing toward goals    Frequency    Min 1X/week      PT Plan      Co-evaluation              AM-PAC PT 6 Clicks Mobility   Outcome Measure  Help needed turning from your back to your side while in a flat bed without using bedrails?: A Lot Help needed moving from lying on your back to sitting on the side of a flat bed without using bedrails?: A Lot Help needed moving to and from a bed to a chair (including a wheelchair)?: A Lot Help needed standing up from a chair using your arms (e.g., wheelchair or bedside chair)?: Total Help needed to walk in hospital room?: Total Help needed climbing 3-5 steps with a railing? : Total 6 Click Score: 9    End of Session Equipment Utilized During Treatment: Gait belt;Oxygen  Activity Tolerance: Patient limited by lethargy;Patient limited by pain Patient left: in chair;with chair alarm set;with call bell/phone within reach Nurse Communication: Other (comment) (Insertion of PureWick) PT Visit Diagnosis: History of falling (Z91.81);Difficulty in walking, not elsewhere classified (R26.2);Pain;Unsteadiness on feet (R26.81) Pain - Right/Left: Right Pain - part of body: Leg     Time: 8966-8886 PT Time Calculation (min) (ACUTE ONLY): 40 min  Charges:    $Therapeutic Activity: 38-52 mins PT General Charges $$ ACUTE PT VISIT: 1 Visit                     Deanie DOROTHA Harms, SPT   Maciel Kegg 04/06/2023, 1:34 PM

## 2023-04-06 NOTE — TOC Transition Note (Signed)
 Transition of Care [facility]) - Discharge Note   Patient Details  Name: Sheena [name] [facility] MRN: [id number] Date of Birth: Jan 12, 1946  Transition of Care [facility]) CM/SW Contact:  Bridget Cordella Simmonds, LCSW Phone Number: 04/06/2023, 11:21 AM   Clinical Narrative:   Pt discharging to [facility].  RN call report to [phone].     Final next level of care: Skilled Nursing Facility Barriers to Discharge: Barriers Resolved   Patient Goals and CMS Choice Patient states their goals for this hospitalization and ongoing recovery are:: Im afraid to go home  I need to get stronger. CMS Medicare.gov Compare Post Acute Care list provided to:: Patient Choice offered to / list presented to : Patient [place] ownership interest in [facility].provided to:: Patient    Discharge Placement              Patient chooses bed at: [facility] Patient to be transferred to facility by: ptar Name of family member notified: daughter Ginger Patient and family notified of of transfer: 04/06/23  Discharge Plan and Services Additional resources added to the After Visit Summary for     Discharge Planning Services: CM Consult                        [facility] Agency: [facility] Date [facility] Agency Contacted: 04/01/23 Time HH Agency Contacted: 1037 Representative spoke with at [facility] Agency: [facility]  Social Drivers of Health (SDOH) Interventions SDOH Screenings   Food Insecurity: No Food Insecurity (04/01/2023)  Housing: Low Risk  (04/01/2023)  Transportation Needs: No Transportation Needs (04/01/2023)  Utilities: Not At Risk (04/01/2023)  Depression (PHQ2-9): Low Risk  (03/17/2023)  Recent Concern: Depression (PHQ2-9) - Medium Risk (02/03/2023)  Financial Resource Strain: Low Risk  (03/17/2023)  Physical Activity: Inactive (03/17/2023)  Social Connections: Socially Isolated (04/01/2023)  Stress: No Stress Concern Present (03/17/2023)  Tobacco Use: Medium Risk (04/01/2023)  Health Literacy:  Adequate Health Literacy (03/17/2023)     Readmission Risk Interventions    10/09/2022    2:31 PM  Readmission Risk Prevention Plan  Transportation Screening Complete  PCP or Specialist Appt within 3-5 Days Complete  HRI or Home Care Consult Complete  Social Work Consult for Recovery Care Planning/Counseling Complete  Palliative Care Screening Not Applicable  Medication Review Oceanographer) Complete

## 2023-04-06 NOTE — TOC Progression Note (Signed)
 Transition of Care [name]) - Progression Note    Patient Details  Name: Sheena [name] MRN: [id number] Date of Birth: Sep 22, 1945  [name] CM/SW Contact  Bridget Cordella Simmonds, LCSW Phone Number: 04/06/2023, 10:08 AM  Clinical Narrative:   TC son Alm asking if pt will be allowed to stay another day, discussed that MD is aware, has not seen pt yet.   0945: Per MD, plan for DC today.  Son Alm informed.  CSW confirmed with Ellen/[facility] that they can receive pt today.   Expected Discharge Plan: Skilled Nursing Facility Barriers to Discharge: Continued Medical Work up  Expected Discharge Plan and Services   Discharge Planning Services: CM Consult   Living arrangements for the past 2 months: Single Family Home                             HH Agency: [facility] Date [facility] Agency Contacted: 04/01/23 Time HH Agency Contacted: 1037 Representative spoke with at [name] Agency: [facility]   Social Determinants of Health (SDOH) Interventions SDOH Screenings   Food Insecurity: No Food Insecurity (04/01/2023)  Housing: Low Risk  (04/01/2023)  Transportation Needs: No Transportation Needs (04/01/2023)  Utilities: Not At Risk (04/01/2023)  Depression (PHQ2-9): Low Risk  (03/17/2023)  Recent Concern: Depression (PHQ2-9) - Medium Risk (02/03/2023)  Financial Resource Strain: Low Risk  (03/17/2023)  Physical Activity: Inactive (03/17/2023)  Social Connections: Socially Isolated (04/01/2023)  Stress: No Stress Concern Present (03/17/2023)  Tobacco Use: Medium Risk (04/01/2023)  Health Literacy: Adequate Health Literacy (03/17/2023)    Readmission Risk Interventions    10/09/2022    2:31 PM  Readmission Risk Prevention Plan  Transportation Screening Complete  PCP or Specialist Appt within 3-5 Days Complete  HRI or Home Care Consult Complete  Social Work Consult for Recovery Care Planning/Counseling Complete  Palliative Care Screening Not Applicable  Medication Review Special Educational Needs Teacher) Complete

## 2023-04-06 NOTE — Consult Note (Signed)
 [facility] Liaison Consult Note   04/06/2023  Sheena [name] [facility] 1945-12-10 [id number]   Primary Care Provider:  Katrinka Garnette KIDD, MD with [place] at [facility] is listed for the Transition Of Care follow up  Insurance: [employer] Complete  Patient was reviewed for readmission prevention for extreme high risk score with a 6-day length of stay and reviewed for barriers to care for returning to community.  Per MD DC summary: Sheena [name] is a 78 y.o. female with a history of lung cancer, hypertension, paroxysmal atrial fibrillation not on anticoagulation, chronic low back pain. Patient presented secondary to being found down in her apartment after a fall the day prior to admission with resultant right hip fracture. Patient underwent an IM Nailing.  Patient was screened for hospitalization and on behalf of [facility]  Care Coordination to assess for post hospital community care needs.  Patient is transitioning to  skilled nursing facility level of care for post hospital transition.  If the patient goes to a [facility] affiliated facility then, patient can be followed by [facility] RN with traditional Medicare and approved Medicare Advantage plans. Currently, patient is for [facility] which is an affiliate facility.   Plan:  If transitions to affiliated facility, then will notify the Community [facility] RN can follow for any known or needs for transitional care needs for returning to post facility care coordination needs to return to community.  For questions or referrals, please contact:  Richerd Fish, RN, BSN, CCM [place]  [facility] Liaison Direct Dial: [phone] or secure chat Email: [email or web address]

## 2023-04-06 NOTE — Plan of Care (Signed)
  Problem: Education: Goal: Knowledge of General Education information will improve Description: Including pain rating scale, medication(s)/side effects and non-pharmacologic comfort measures Outcome: Progressing   Problem: Activity: Goal: Risk for activity intolerance will decrease Outcome: Progressing   Problem: Pain Managment: Goal: General experience of comfort will improve and/or be controlled Outcome: Progressing

## 2023-04-07 DIAGNOSIS — I4891 Unspecified atrial fibrillation: Secondary | ICD-10-CM | POA: Diagnosis not present

## 2023-04-07 DIAGNOSIS — T796XXA Traumatic ischemia of muscle, initial encounter: Secondary | ICD-10-CM | POA: Diagnosis not present

## 2023-04-07 DIAGNOSIS — C3431 Malignant neoplasm of lower lobe, right bronchus or lung: Secondary | ICD-10-CM | POA: Diagnosis not present

## 2023-04-07 DIAGNOSIS — I10 Essential (primary) hypertension: Secondary | ICD-10-CM | POA: Diagnosis not present

## 2023-04-07 DIAGNOSIS — S72141A Displaced intertrochanteric fracture of right femur, initial encounter for closed fracture: Secondary | ICD-10-CM | POA: Diagnosis not present

## 2023-04-07 DIAGNOSIS — N170 Acute kidney failure with tubular necrosis: Secondary | ICD-10-CM | POA: Diagnosis not present

## 2023-04-07 DIAGNOSIS — D62 Acute posthemorrhagic anemia: Secondary | ICD-10-CM | POA: Diagnosis not present

## 2023-04-09 DIAGNOSIS — S72141D Displaced intertrochanteric fracture of right femur, subsequent encounter for closed fracture with routine healing: Secondary | ICD-10-CM | POA: Diagnosis not present

## 2023-04-09 DIAGNOSIS — Z741 Need for assistance with personal care: Secondary | ICD-10-CM | POA: Diagnosis not present

## 2023-04-09 DIAGNOSIS — D62 Acute posthemorrhagic anemia: Secondary | ICD-10-CM | POA: Diagnosis not present

## 2023-04-09 DIAGNOSIS — J9611 Chronic respiratory failure with hypoxia: Secondary | ICD-10-CM | POA: Diagnosis not present

## 2023-04-09 DIAGNOSIS — Z9181 History of falling: Secondary | ICD-10-CM | POA: Diagnosis not present

## 2023-04-09 DIAGNOSIS — S72141A Displaced intertrochanteric fracture of right femur, initial encounter for closed fracture: Secondary | ICD-10-CM | POA: Diagnosis not present

## 2023-04-09 DIAGNOSIS — T796XXA Traumatic ischemia of muscle, initial encounter: Secondary | ICD-10-CM | POA: Diagnosis not present

## 2023-04-09 DIAGNOSIS — N170 Acute kidney failure with tubular necrosis: Secondary | ICD-10-CM | POA: Diagnosis not present

## 2023-04-09 DIAGNOSIS — M6281 Muscle weakness (generalized): Secondary | ICD-10-CM | POA: Diagnosis not present

## 2023-04-09 DIAGNOSIS — C3431 Malignant neoplasm of lower lobe, right bronchus or lung: Secondary | ICD-10-CM | POA: Diagnosis not present

## 2023-04-09 DIAGNOSIS — I4891 Unspecified atrial fibrillation: Secondary | ICD-10-CM | POA: Diagnosis not present

## 2023-04-09 DIAGNOSIS — R2689 Other abnormalities of gait and mobility: Secondary | ICD-10-CM | POA: Diagnosis not present

## 2023-04-09 DIAGNOSIS — I10 Essential (primary) hypertension: Secondary | ICD-10-CM | POA: Diagnosis not present

## 2023-04-12 DIAGNOSIS — C3431 Malignant neoplasm of lower lobe, right bronchus or lung: Secondary | ICD-10-CM | POA: Diagnosis not present

## 2023-04-12 DIAGNOSIS — I4891 Unspecified atrial fibrillation: Secondary | ICD-10-CM | POA: Diagnosis not present

## 2023-04-12 DIAGNOSIS — I10 Essential (primary) hypertension: Secondary | ICD-10-CM | POA: Diagnosis not present

## 2023-04-12 DIAGNOSIS — S72141A Displaced intertrochanteric fracture of right femur, initial encounter for closed fracture: Secondary | ICD-10-CM | POA: Diagnosis not present

## 2023-04-12 DIAGNOSIS — D62 Acute posthemorrhagic anemia: Secondary | ICD-10-CM | POA: Diagnosis not present

## 2023-04-12 DIAGNOSIS — N170 Acute kidney failure with tubular necrosis: Secondary | ICD-10-CM | POA: Diagnosis not present

## 2023-04-12 DIAGNOSIS — T796XXA Traumatic ischemia of muscle, initial encounter: Secondary | ICD-10-CM | POA: Diagnosis not present

## 2023-04-13 ENCOUNTER — Other Ambulatory Visit: Payer: Self-pay | Admitting: *Deleted

## 2023-04-13 DIAGNOSIS — S72141A Displaced intertrochanteric fracture of right femur, initial encounter for closed fracture: Secondary | ICD-10-CM | POA: Diagnosis not present

## 2023-04-13 DIAGNOSIS — D62 Acute posthemorrhagic anemia: Secondary | ICD-10-CM | POA: Diagnosis not present

## 2023-04-13 DIAGNOSIS — I4891 Unspecified atrial fibrillation: Secondary | ICD-10-CM | POA: Diagnosis not present

## 2023-04-13 DIAGNOSIS — S72141D Displaced intertrochanteric fracture of right femur, subsequent encounter for closed fracture with routine healing: Secondary | ICD-10-CM | POA: Diagnosis not present

## 2023-04-13 DIAGNOSIS — I10 Essential (primary) hypertension: Secondary | ICD-10-CM | POA: Diagnosis not present

## 2023-04-13 DIAGNOSIS — T796XXA Traumatic ischemia of muscle, initial encounter: Secondary | ICD-10-CM | POA: Diagnosis not present

## 2023-04-13 DIAGNOSIS — Z9181 History of falling: Secondary | ICD-10-CM | POA: Diagnosis not present

## 2023-04-13 DIAGNOSIS — Z741 Need for assistance with personal care: Secondary | ICD-10-CM | POA: Diagnosis not present

## 2023-04-13 DIAGNOSIS — M6281 Muscle weakness (generalized): Secondary | ICD-10-CM | POA: Diagnosis not present

## 2023-04-13 DIAGNOSIS — R2689 Other abnormalities of gait and mobility: Secondary | ICD-10-CM | POA: Diagnosis not present

## 2023-04-13 DIAGNOSIS — C3431 Malignant neoplasm of lower lobe, right bronchus or lung: Secondary | ICD-10-CM | POA: Diagnosis not present

## 2023-04-13 DIAGNOSIS — J9611 Chronic respiratory failure with hypoxia: Secondary | ICD-10-CM | POA: Diagnosis not present

## 2023-04-13 DIAGNOSIS — N170 Acute kidney failure with tubular necrosis: Secondary | ICD-10-CM | POA: Diagnosis not present

## 2023-04-13 NOTE — Patient Outreach (Signed)
Mrs. Oelke resides in [facility].  Screening for potential chronic care management services as a benefit of health plan and primary care provider.  Communication sent to SNF social worker to collaborate about transition plans and potential complex care management needs.   Raiford Noble, MSN, RN, BSN [place]  [facility], [facility] Communities RN Post- Acute Care Manager Direct Dial: [phone]

## 2023-04-14 DIAGNOSIS — S72141A Displaced intertrochanteric fracture of right femur, initial encounter for closed fracture: Secondary | ICD-10-CM | POA: Diagnosis not present

## 2023-04-14 DIAGNOSIS — C3431 Malignant neoplasm of lower lobe, right bronchus or lung: Secondary | ICD-10-CM | POA: Diagnosis not present

## 2023-04-14 DIAGNOSIS — K5909 Other constipation: Secondary | ICD-10-CM | POA: Diagnosis not present

## 2023-04-14 DIAGNOSIS — I4891 Unspecified atrial fibrillation: Secondary | ICD-10-CM | POA: Diagnosis not present

## 2023-04-14 DIAGNOSIS — N170 Acute kidney failure with tubular necrosis: Secondary | ICD-10-CM | POA: Diagnosis not present

## 2023-04-14 DIAGNOSIS — I10 Essential (primary) hypertension: Secondary | ICD-10-CM | POA: Diagnosis not present

## 2023-04-14 DIAGNOSIS — T796XXA Traumatic ischemia of muscle, initial encounter: Secondary | ICD-10-CM | POA: Diagnosis not present

## 2023-04-14 DIAGNOSIS — D62 Acute posthemorrhagic anemia: Secondary | ICD-10-CM | POA: Diagnosis not present

## 2023-04-15 ENCOUNTER — Other Ambulatory Visit: Payer: Self-pay | Admitting: Family Medicine

## 2023-04-18 ENCOUNTER — Other Ambulatory Visit: Payer: Self-pay

## 2023-04-18 ENCOUNTER — Emergency Department (HOSPITAL_COMMUNITY): Payer: 59

## 2023-04-18 ENCOUNTER — Inpatient Hospital Stay (HOSPITAL_COMMUNITY)
Admission: EM | Admit: 2023-04-18 | Discharge: 2023-04-29 | DRG: 186 | Disposition: A | Discharge status: Skilled Nursing Facility | Payer: 59 | Source: Skilled Nursing Facility | Attending: Internal Medicine | Admitting: Internal Medicine

## 2023-04-18 ENCOUNTER — Encounter (HOSPITAL_COMMUNITY): Payer: Self-pay

## 2023-04-18 DIAGNOSIS — Z1152 Encounter for screening for COVID-19: Secondary | ICD-10-CM | POA: Diagnosis not present

## 2023-04-18 DIAGNOSIS — F419 Anxiety disorder, unspecified: Secondary | ICD-10-CM | POA: Diagnosis present

## 2023-04-18 DIAGNOSIS — Z96649 Presence of unspecified artificial hip joint: Secondary | ICD-10-CM | POA: Diagnosis present

## 2023-04-18 DIAGNOSIS — B379 Candidiasis, unspecified: Secondary | ICD-10-CM | POA: Diagnosis not present

## 2023-04-18 DIAGNOSIS — D539 Nutritional anemia, unspecified: Secondary | ICD-10-CM | POA: Diagnosis not present

## 2023-04-18 DIAGNOSIS — J90 Pleural effusion, not elsewhere classified: Principal | ICD-10-CM

## 2023-04-18 DIAGNOSIS — R64 Cachexia: Secondary | ICD-10-CM | POA: Diagnosis not present

## 2023-04-18 DIAGNOSIS — R1310 Dysphagia, unspecified: Secondary | ICD-10-CM | POA: Diagnosis present

## 2023-04-18 DIAGNOSIS — J9819 Other pulmonary collapse: Secondary | ICD-10-CM | POA: Diagnosis not present

## 2023-04-18 DIAGNOSIS — D638 Anemia in other chronic diseases classified elsewhere: Secondary | ICD-10-CM | POA: Diagnosis not present

## 2023-04-18 DIAGNOSIS — A419 Sepsis, unspecified organism: Secondary | ICD-10-CM | POA: Diagnosis not present

## 2023-04-18 DIAGNOSIS — C3432 Malignant neoplasm of lower lobe, left bronchus or lung: Secondary | ICD-10-CM | POA: Diagnosis not present

## 2023-04-18 DIAGNOSIS — J9811 Atelectasis: Secondary | ICD-10-CM | POA: Diagnosis not present

## 2023-04-18 DIAGNOSIS — R0902 Hypoxemia: Secondary | ICD-10-CM | POA: Diagnosis not present

## 2023-04-18 DIAGNOSIS — J9601 Acute respiratory failure with hypoxia: Secondary | ICD-10-CM | POA: Diagnosis not present

## 2023-04-18 DIAGNOSIS — R627 Adult failure to thrive: Secondary | ICD-10-CM | POA: Diagnosis present

## 2023-04-18 DIAGNOSIS — Z681 Body mass index (BMI) 19 or less, adult: Secondary | ICD-10-CM | POA: Diagnosis not present

## 2023-04-18 DIAGNOSIS — J449 Chronic obstructive pulmonary disease, unspecified: Secondary | ICD-10-CM | POA: Diagnosis not present

## 2023-04-18 DIAGNOSIS — R059 Cough, unspecified: Secondary | ICD-10-CM | POA: Diagnosis not present

## 2023-04-18 DIAGNOSIS — R531 Weakness: Secondary | ICD-10-CM | POA: Diagnosis not present

## 2023-04-18 DIAGNOSIS — R471 Dysarthria and anarthria: Secondary | ICD-10-CM | POA: Diagnosis not present

## 2023-04-18 DIAGNOSIS — Z825 Family history of asthma and other chronic lower respiratory diseases: Secondary | ICD-10-CM

## 2023-04-18 DIAGNOSIS — J9621 Acute and chronic respiratory failure with hypoxia: Secondary | ICD-10-CM | POA: Diagnosis present

## 2023-04-18 DIAGNOSIS — Z7982 Long term (current) use of aspirin: Secondary | ICD-10-CM | POA: Diagnosis not present

## 2023-04-18 DIAGNOSIS — E785 Hyperlipidemia, unspecified: Secondary | ICD-10-CM | POA: Diagnosis not present

## 2023-04-18 DIAGNOSIS — R0602 Shortness of breath: Secondary | ICD-10-CM | POA: Diagnosis not present

## 2023-04-18 DIAGNOSIS — S72141A Displaced intertrochanteric fracture of right femur, initial encounter for closed fracture: Secondary | ICD-10-CM | POA: Diagnosis present

## 2023-04-18 DIAGNOSIS — I480 Paroxysmal atrial fibrillation: Secondary | ICD-10-CM | POA: Diagnosis not present

## 2023-04-18 DIAGNOSIS — Z923 Personal history of irradiation: Secondary | ICD-10-CM

## 2023-04-18 DIAGNOSIS — E43 Unspecified severe protein-calorie malnutrition: Secondary | ICD-10-CM | POA: Diagnosis not present

## 2023-04-18 DIAGNOSIS — Z886 Allergy status to analgesic agent status: Secondary | ICD-10-CM

## 2023-04-18 DIAGNOSIS — Z743 Need for continuous supervision: Secondary | ICD-10-CM | POA: Diagnosis not present

## 2023-04-18 DIAGNOSIS — Z8249 Family history of ischemic heart disease and other diseases of the circulatory system: Secondary | ICD-10-CM

## 2023-04-18 DIAGNOSIS — R918 Other nonspecific abnormal finding of lung field: Secondary | ICD-10-CM | POA: Diagnosis not present

## 2023-04-18 DIAGNOSIS — Z66 Do not resuscitate: Secondary | ICD-10-CM | POA: Diagnosis not present

## 2023-04-18 DIAGNOSIS — Y95 Nosocomial condition: Secondary | ICD-10-CM | POA: Diagnosis present

## 2023-04-18 DIAGNOSIS — J189 Pneumonia, unspecified organism: Secondary | ICD-10-CM | POA: Diagnosis not present

## 2023-04-18 DIAGNOSIS — R008 Other abnormalities of heart beat: Secondary | ICD-10-CM | POA: Diagnosis not present

## 2023-04-18 DIAGNOSIS — Z85118 Personal history of other malignant neoplasm of bronchus and lung: Secondary | ICD-10-CM | POA: Diagnosis not present

## 2023-04-18 DIAGNOSIS — J440 Chronic obstructive pulmonary disease with acute lower respiratory infection: Secondary | ICD-10-CM | POA: Diagnosis not present

## 2023-04-18 DIAGNOSIS — Z87891 Personal history of nicotine dependence: Secondary | ICD-10-CM

## 2023-04-18 DIAGNOSIS — I10 Essential (primary) hypertension: Secondary | ICD-10-CM | POA: Diagnosis not present

## 2023-04-18 DIAGNOSIS — R6520 Severe sepsis without septic shock: Secondary | ICD-10-CM | POA: Diagnosis not present

## 2023-04-18 DIAGNOSIS — R29810 Facial weakness: Secondary | ICD-10-CM | POA: Diagnosis present

## 2023-04-18 DIAGNOSIS — Z515 Encounter for palliative care: Secondary | ICD-10-CM | POA: Diagnosis not present

## 2023-04-18 DIAGNOSIS — Z9981 Dependence on supplemental oxygen: Secondary | ICD-10-CM

## 2023-04-18 DIAGNOSIS — R5381 Other malaise: Secondary | ICD-10-CM

## 2023-04-18 DIAGNOSIS — I700 Atherosclerosis of aorta: Secondary | ICD-10-CM | POA: Diagnosis not present

## 2023-04-18 DIAGNOSIS — G9341 Metabolic encephalopathy: Secondary | ICD-10-CM | POA: Diagnosis present

## 2023-04-18 DIAGNOSIS — Z9221 Personal history of antineoplastic chemotherapy: Secondary | ICD-10-CM

## 2023-04-18 DIAGNOSIS — R6889 Other general symptoms and signs: Secondary | ICD-10-CM | POA: Diagnosis not present

## 2023-04-18 DIAGNOSIS — R9389 Abnormal findings on diagnostic imaging of other specified body structures: Secondary | ICD-10-CM | POA: Diagnosis not present

## 2023-04-18 DIAGNOSIS — Z79899 Other long term (current) drug therapy: Secondary | ICD-10-CM

## 2023-04-18 DIAGNOSIS — Z888 Allergy status to other drugs, medicaments and biological substances status: Secondary | ICD-10-CM

## 2023-04-18 LAB — COMPREHENSIVE METABOLIC PANEL
ALT: 23 U/L (ref 0–44)
AST: 35 U/L (ref 15–41)
Albumin: 3.5 g/dL (ref 3.5–5.0)
Alkaline Phosphatase: 111 U/L (ref 38–126)
Anion gap: 11 (ref 5–15)
BUN: 22 mg/dL (ref 8–23)
CO2: 29 mmol/L (ref 22–32)
Calcium: 9.2 mg/dL (ref 8.9–10.3)
Chloride: 98 mmol/L (ref 98–111)
Creatinine, Ser: 0.9 mg/dL (ref 0.44–1.00)
GFR, Estimated: >60 mL/min (ref >60)
Glucose, Bld: 117 mg/dL — ABNORMAL HIGH (ref 70–99)
Potassium: 4.8 mmol/L (ref 3.5–5.1)
Sodium: 138 mmol/L (ref 135–145)
Total Bilirubin: 0.7 mg/dL (ref 0.0–1.2)
Total Protein: 7 g/dL (ref 6.5–8.1)

## 2023-04-18 LAB — PROTIME-INR
INR: 1.1 (ref 0.8–1.2)
Prothrombin Time: 13.9 s (ref 11.4–15.2)

## 2023-04-18 LAB — CBC WITH DIFFERENTIAL/PLATELET
Abs Immature Granulocytes: 0.03 10*3/uL (ref 0.00–0.07)
Basophils Absolute: 0 10*3/uL (ref 0.0–0.1)
Basophils Relative: 0 %
Eosinophils Absolute: 0 10*3/uL (ref 0.0–0.5)
Eosinophils Relative: 0 %
HCT: 32.5 % — ABNORMAL LOW (ref 36.0–46.0)
Hemoglobin: 10.2 g/dL — ABNORMAL LOW (ref 12.0–15.0)
Immature Granulocytes: 1 %
Lymphocytes Relative: 5 %
Lymphs Abs: 0.3 10*3/uL — ABNORMAL LOW (ref 0.7–4.0)
MCH: 33.9 pg (ref 26.0–34.0)
MCHC: 31.4 g/dL (ref 30.0–36.0)
MCV: 108 fL — ABNORMAL HIGH (ref 80.0–100.0)
Monocytes Absolute: 0.5 10*3/uL (ref 0.1–1.0)
Monocytes Relative: 8 %
Neutro Abs: 5.4 10*3/uL (ref 1.7–7.7)
Neutrophils Relative %: 86 %
Platelets: 188 10*3/uL (ref 150–400)
RBC: 3.01 MIL/uL — ABNORMAL LOW (ref 3.87–5.11)
RDW: 15.1 % (ref 11.5–15.5)
WBC: 6.2 10*3/uL (ref 4.0–10.5)
nRBC: 0 % (ref 0.0–0.2)

## 2023-04-18 LAB — RESP PANEL BY RT-PCR (RSV, FLU A&B, COVID)  RVPGX2
Influenza A by PCR: NEGATIVE
Influenza B by PCR: NEGATIVE
Resp Syncytial Virus by PCR: NEGATIVE
SARS Coronavirus 2 by RT PCR: NEGATIVE

## 2023-04-18 LAB — APTT: aPTT: 29 s (ref 24–36)

## 2023-04-18 LAB — I-STAT CG4 LACTIC ACID, ED: Lactic Acid, Venous: 0.7 mmol/L (ref 0.5–1.9)

## 2023-04-18 MED ORDER — SODIUM CHLORIDE 0.9 % IV SOLN
500.0000 mg | Freq: Once | INTRAVENOUS | Status: AC
  Administered 2023-04-18: 500 mg via INTRAVENOUS
  Filled 2023-04-18: qty 5

## 2023-04-18 MED ORDER — SODIUM CHLORIDE 0.9 % IV SOLN
2.0000 g | Freq: Once | INTRAVENOUS | Status: AC
  Administered 2023-04-18: 2 g via INTRAVENOUS
  Filled 2023-04-18: qty 20

## 2023-04-18 MED ORDER — LACTATED RINGERS IV BOLUS (SEPSIS)
1000.0000 mL | Freq: Once | INTRAVENOUS | Status: AC
  Administered 2023-04-18: 1000 mL via INTRAVENOUS

## 2023-04-18 MED ORDER — LACTATED RINGERS IV SOLN: INTRAVENOUS | Status: DC

## 2023-04-18 NOTE — ED Triage Notes (Addendum)
 Pt BIBA from [facility], c/o cough, lethargy, and chills.  Per EMS heard ronchi all over. Hx of lung cancer.  At facility due to previous hip surgery, 2L [place] baseline. Per EMS O2 sat was 85 placed on 10L Non rebreather. Bought O2 up to 99.  BP 106/48 baseline soft pressure.  HR 60 RR 22 O2 99 CBG 147

## 2023-04-18 NOTE — ED Provider Notes (Incomplete)
 [place] EMERGENCY DEPARTMENT AT [facility] Provider Note   CSN: [id number] Arrival date & time: 04/18/23  2109     History {Add pertinent medical, surgical, social history, OB history to HPI:1} No chief complaint on file.   Sheena [name] is a 78 y.o. female history of A-fib not currently anticoagulated, DNR, lung cancer with Port-A-Cath, recent hip surgery presented for shortness of breath and altered mental status.  Patient comes from [facility].  Patient is currently AAO x 0 and unable to provide history due to altered mental status.  EMS states that patient is only on 2 L however required 10 L within she was 85% on room air.  Level 5 caveat: Altered mental status Home Medications Prior to Admission medications   Medication Sig Start Date End Date Taking? Authorizing Provider  acetaminophen (TYLENOL) 500 MG tablet Take 1,000 mg by mouth 2 (two) times daily as needed for mild pain.    [provider]  amiodarone (PACERONE) 200 MG tablet Take 1 tablet (200 mg total) by mouth daily. 10/14/22   Duke, Roe Rutherford, PA  amLODipine (NORVASC) 5 MG tablet Take 5 mg by mouth daily. 01/02/23   [provider]  aspirin EC 81 MG tablet Take 1 tablet (81 mg total) by mouth daily. Swallow whole. 04/05/23 05/05/23  West Bali, PA-C  atorvastatin (LIPITOR) 20 MG tablet TAKE 1 TABLET BY MOUTH EVERY DAY 04/15/23   [name], MD  Calcium Citrate-Vitamin D (CALCIUM CITRATE PETITE/VIT D PO) Take 1 tablet by mouth 2 (two) times daily.    [provider]  calcium-vitamin D (OYSTER CALCIUM 500 + D) 500-200 MG-UNIT tablet Take 1 tablet by mouth 2 (two) times daily. 01/22/23   [provider]  cyanocobalamin 1000 MCG tablet Take 1 tablet (1,000 mcg total) by mouth daily. 04/07/23   Narda Bonds, MD  docusate sodium (COLACE) 100 MG capsule Take 1 capsule (100 mg total) by mouth 2 (two) times daily. 04/06/23   Narda Bonds, MD  feeding  supplement (ENSURE ENLIVE / ENSURE PLUS) LIQD Take 237 mLs by mouth 2 (two) times daily between meals. 04/06/23   Narda Bonds, MD  folic acid (FOLVITE) 1 MG tablet Take 1 tablet (1 mg total) by mouth daily. 04/07/23   Narda Bonds, MD  lidocaine-prilocaine (EMLA) cream Apply 1 Application topically as needed. 03/16/23   Si Gaul, MD  magnesium oxide (MAG-OX) 400 MG tablet Take 1 tablet by mouth daily. 01/22/23   [provider]  methocarbamol (ROBAXIN) 500 MG tablet Take 1 tablet (500 mg total) by mouth every 6 (six) hours as needed for muscle spasms. 04/05/23   West Bali, PA-C  metoprolol tartrate (LOPRESSOR) 25 MG tablet Take 12.5 mg by mouth 2 (two) times daily.    [provider]  oxyCODONE (OXY IR/ROXICODONE) 5 MG immediate release tablet Take 1 tablet (5 mg total) by mouth every 4 (four) hours as needed for severe pain (pain score 7-10). 04/05/23   West Bali, PA-C  polyethylene glycol (MIRALAX / GLYCOLAX) 17 g packet Take 17 g by mouth daily as needed for mild constipation. Patient not taking: Reported on 03/31/2023 12/21/22   Kathrynn Running, MD  PRESCRIPTION MEDICATION Apply 1 application  topically in the morning and at bedtime. Unknown cream given to Pt from the cancer center for after radiation    [provider]  prochlorperazine (COMPAZINE) 10 MG tablet Take 1 tablet (10 mg total)  by mouth every 6 (six) hours as needed for nausea or vomiting. 08/04/22   Heilingoetter, Cassandra L, PA-C      Allergies    Antihistamines, diphenhydramine-type; Aspirin; and Evista [raloxifene]    Review of Systems   Review of Systems  Physical Exam Updated Vital Signs BP 100/60 (BP Location: Left Arm)   Pulse 68   Temp 97.9 F (36.6 C) (Oral)   Resp 20   Ht 5' (1.524 m)   Wt 39.5 kg   SpO2 100%   BMI 16.99 kg/m  Physical Exam Constitutional:      Appearance: She is ill-appearing and toxic-appearing.  Neck:     Comments: No JVD Cardiovascular:      Rate and Rhythm: Normal rate. Rhythm irregular.     Pulses: Normal pulses.     Heart sounds: Normal heart sounds.  Pulmonary:     Comments: On 3 L nasal cannula which is up from baseline Rhonchi noted bilaterally Nonproductive cough Abdominal:     Palpations: Abdomen is soft.     Tenderness: There is no abdominal tenderness. There is no guarding or rebound.  Skin:    General: Skin is warm and dry.     Capillary Refill: Capillary refill takes less than 2 seconds.     Coloration: Skin is pale.  Neurological:     Mental Status: She is alert.     ED Results / Procedures / Treatments   Labs (all labs ordered are listed, but only abnormal results are displayed) Labs Reviewed  COMPREHENSIVE METABOLIC PANEL - Abnormal; Notable for the following components:      Result Value   Glucose, Bld 117 (*)    All other components within normal limits  CBC WITH DIFFERENTIAL/PLATELET - Abnormal; Notable for the following components:   RBC 3.01 (*)    Hemoglobin 10.2 (*)    HCT 32.5 (*)    MCV 108.0 (*)    Lymphs Abs 0.3 (*)    All other components within normal limits  RESP PANEL BY RT-PCR (RSV, FLU A&B, COVID)  RVPGX2  CULTURE, BLOOD (ROUTINE X 2)  CULTURE, BLOOD (ROUTINE X 2)  PROTIME-INR  APTT  URINALYSIS, W/ REFLEX TO CULTURE (INFECTION SUSPECTED)  I-STAT CG4 LACTIC ACID, ED  I-STAT CG4 LACTIC ACID, ED    EKG None  Radiology DG Chest Port 1 View Result Date: 04/18/2023 CLINICAL DATA:  Sepsis EXAM: PORTABLE CHEST 1 VIEW COMPARISON:  03/31/2023 FINDINGS: There is right middle and lower lobe collapse with elevation of the right hemidiaphragm and right-sided volume loss. Right upper lobe is clear. Left lung appears hyperinflated, in keeping with changes of underlying COPD. Left pleural effusion is suspected. Right internal jugular chest port tip is seen within the superior vena cava. There is mediastinal shift to the right with obscuration of the cardiac silhouette. No acute bone  abnormality. Multilevel vertebroplasty and numerous midthoracic compression deformities again noted. IMPRESSION: 1. Right middle and lower lobe collapse with elevation of the right hemidiaphragm and right-sided volume loss. Correlation for a central obstructing lesion, such as a mucous plug or aspirated foreign is recommended given its relatively rapid development. 2. COPD. 3. Suspected left pleural effusion. Electronically Signed   By: Helyn Numbers M.D.   On: 04/18/2023 22:28    Procedures .Critical Care  Performed by: Netta Corrigan, PA-C Authorized by: Netta Corrigan, PA-C   Critical care provider statement:    Critical care time (minutes):  30   Critical care time was exclusive  of:  Separately billable procedures and treating other patients   Critical care was necessary to treat or prevent imminent or life-threatening deterioration of the following conditions:  Sepsis and respiratory failure   Critical care was time spent personally by me on the following activities:  Blood draw for specimens, development of treatment plan with patient or surrogate, evaluation of patient's response to treatment, examination of patient, review of old charts, re-evaluation of patient's condition, pulse oximetry, ordering and review of radiographic studies, ordering and review of laboratory studies, ordering and performing treatments and interventions and obtaining history from patient or surrogate   I assumed direction of critical care for this patient from another provider in my specialty: no     Care discussed with comment:  Oncoming team   {Document cardiac monitor, telemetry assessment procedure when appropriate:1}  Medications Ordered in ED Medications  lactated ringers infusion (has no administration in time range)  lactated ringers bolus 1,000 mL (1,000 mLs Intravenous New Bag/Given 04/18/23 2300)  azithromycin (ZITHROMAX) 500 mg in sodium chloride 0.9 % 250 mL IVPB (500 mg Intravenous New  Bag/Given 04/18/23 2327)  cefTRIAXone (ROCEPHIN) 2 g in sodium chloride 0.9 % 100 mL IVPB (0 g Intravenous Stopped 04/18/23 2234)    ED Course/ Medical Decision Making/ A&P   {   Click here for ABCD2, HEART and other calculatorsREFRESH Note before signing :1}                              Medical Decision Making Amount and/or Complexity of Data Reviewed Labs: ordered. Radiology: ordered.  Risk Prescription drug management.   Thresea Doble Cone 78 y.o. presented today for sepsis.  Working DDx that I considered at this time includes, but not limited to, sepsis, bacteremia, UTI, pneumonia, meningitis/encephalitis, cellulitis, ACS, myocarditis, acidosis, dehydration, electrolyte abnormalities, PE.  R/o DDx: pending  Review of prior external notes: 04/06/2023 discharge summary  Unique Tests and My Independent Interpretation: CBC: Unremarkable CMP: Unremarkable Lactic acid: Unremarkable UA: pending Chest x-ray: Left pleural effusion, COPD, possible obstructing lesion versus aspiration aPTT: Unremarkable PT/INR: Unremarkable Blood cultures: Pending Respiratory panel: Negative EKG: Sinus 68, history of heart block, no signs of ST elevation or depressions or right heart strain CTA chest: Pending  Social Determinants of Health: Nursing facility  Discussion with Independent Historian:  EMS  Discussion of Management of Tests: None  Risk: High: Hospitalization  Risk Stratification Score: none  Staffed with Silverio Lay, MD  Plan: On exam patient was septic on arrival.  On exam patient ill-appearing with soft blood pressure and alert and oriented x 0.  Patient did have crackles bilaterally with a nonproductive cough suspicious of pneumonia. The cardiac monitor was ordered secondary to the patient's history of sepsis and to monitor the patient for dysrhythmia. Cardiac monitor by my independent interpretation showed normal sinus.  Will get CT as patient does have history of A-fib with recent  surgery and is bedridden to rule out PE.  Patient signed out to [place], [place].  Please review their note for the continuation of patient's care.  The plan at this point is follow-up to monitor and admit.  This chart was dictated using voice recognition software.  Despite best efforts to proofread,  errors can occur which can change the documentation meaning.   {Document critical care time when appropriate:1} {Document review of labs and clinical decision tools ie heart score, Chads2Vasc2 etc:1}  {Document your independent review of radiology images, and  any outside records:1} {Document your discussion with family members, caretakers, and with consultants:1} {Document social determinants of health affecting pt's care:1} {Document your decision making why or why not admission, treatments were needed:1} Final Clinical Impression(s) / ED Diagnoses Final diagnoses:  Sepsis with acute hypoxic respiratory failure without septic shock, due to unspecified organism (HCC)  HCAP (healthcare-associated pneumonia)  Pleural effusion    Rx / DC Orders ED Discharge Orders     None

## 2023-04-18 NOTE — ED Provider Notes (Signed)
 [place] EMERGENCY DEPARTMENT AT [facility] Provider Note   CSN: [id number] Arrival date & time: 04/18/23  2109     History {Add pertinent medical, surgical, social history, OB history to HPI:1} No chief complaint on file.   Sheena [name] is a 78 y.o. female history of A-fib not currently anticoagulated, DNR, lung cancer with Port-A-Cath, recent hip surgery presented for shortness of breath and altered mental status.  Patient comes from [facility].  Patient is currently AAO x 0 and unable to provide history due to altered mental status.  EMS states that patient is only on 2 L however required 10 L within she was 85% on room air.  Level 5 caveat: Altered mental status Home Medications Prior to Admission medications   Medication Sig Start Date End Date Taking? Authorizing Provider  acetaminophen (TYLENOL) 500 MG tablet Take 1,000 mg by mouth 2 (two) times daily as needed for mild pain.    [provider]  amiodarone (PACERONE) 200 MG tablet Take 1 tablet (200 mg total) by mouth daily. 10/14/22   Duke, Roe Rutherford, PA  amLODipine (NORVASC) 5 MG tablet Take 5 mg by mouth daily. 01/02/23   [provider]  aspirin EC 81 MG tablet Take 1 tablet (81 mg total) by mouth daily. Swallow whole. 04/05/23 05/05/23  West Bali, PA-C  atorvastatin (LIPITOR) 20 MG tablet TAKE 1 TABLET BY MOUTH EVERY DAY 04/15/23   [name], MD  Calcium Citrate-Vitamin D (CALCIUM CITRATE PETITE/VIT D PO) Take 1 tablet by mouth 2 (two) times daily.    [provider]  calcium-vitamin D (OYSTER CALCIUM 500 + D) 500-200 MG-UNIT tablet Take 1 tablet by mouth 2 (two) times daily. 01/22/23   [provider]  cyanocobalamin 1000 MCG tablet Take 1 tablet (1,000 mcg total) by mouth daily. 04/07/23   Narda Bonds, MD  docusate sodium (COLACE) 100 MG capsule Take 1 capsule (100 mg total) by mouth 2 (two) times daily. 04/06/23   Narda Bonds, MD  feeding  supplement (ENSURE ENLIVE / ENSURE PLUS) LIQD Take 237 mLs by mouth 2 (two) times daily between meals. 04/06/23   Narda Bonds, MD  folic acid (FOLVITE) 1 MG tablet Take 1 tablet (1 mg total) by mouth daily. 04/07/23   Narda Bonds, MD  lidocaine-prilocaine (EMLA) cream Apply 1 Application topically as needed. 03/16/23   Si Gaul, MD  magnesium oxide (MAG-OX) 400 MG tablet Take 1 tablet by mouth daily. 01/22/23   [provider]  methocarbamol (ROBAXIN) 500 MG tablet Take 1 tablet (500 mg total) by mouth every 6 (six) hours as needed for muscle spasms. 04/05/23   West Bali, PA-C  metoprolol tartrate (LOPRESSOR) 25 MG tablet Take 12.5 mg by mouth 2 (two) times daily.    [provider]  oxyCODONE (OXY IR/ROXICODONE) 5 MG immediate release tablet Take 1 tablet (5 mg total) by mouth every 4 (four) hours as needed for severe pain (pain score 7-10). 04/05/23   West Bali, PA-C  polyethylene glycol (MIRALAX / GLYCOLAX) 17 g packet Take 17 g by mouth daily as needed for mild constipation. Patient not taking: Reported on 03/31/2023 12/21/22   Kathrynn Running, MD  PRESCRIPTION MEDICATION Apply 1 application  topically in the morning and at bedtime. Unknown cream given to Pt from the cancer center for after radiation    [provider]  prochlorperazine (COMPAZINE) 10 MG tablet Take 1 tablet (10 mg total)  by mouth every 6 (six) hours as needed for nausea or vomiting. 08/04/22   Heilingoetter, Cassandra L, PA-C      Allergies    Antihistamines, diphenhydramine-type; Aspirin; and Evista [raloxifene]    Review of Systems   Review of Systems  Physical Exam Updated Vital Signs BP 100/60 (BP Location: Left Arm)   Pulse 68   Temp 97.9 F (36.6 C) (Oral)   Resp 20   Ht 5' (1.524 m)   Wt 39.5 kg   SpO2 100%   BMI 16.99 kg/m  Physical Exam Constitutional:      Appearance: She is ill-appearing and toxic-appearing.  Neck:     Comments: No JVD Cardiovascular:      Rate and Rhythm: Normal rate. Rhythm irregular.     Pulses: Normal pulses.     Heart sounds: Normal heart sounds.  Pulmonary:     Comments: On 3 L nasal cannula which is up from baseline Rhonchi noted bilaterally Nonproductive cough Abdominal:     Palpations: Abdomen is soft.     Tenderness: There is no abdominal tenderness. There is no guarding or rebound.  Skin:    General: Skin is warm and dry.     Capillary Refill: Capillary refill takes less than 2 seconds.     Coloration: Skin is pale.  Neurological:     Mental Status: She is alert.    ED Results / Procedures / Treatments   Labs (all labs ordered are listed, but only abnormal results are displayed) Labs Reviewed  CBC WITH DIFFERENTIAL/PLATELET - Abnormal; Notable for the following components:      Result Value   RBC 3.01 (*)    Hemoglobin 10.2 (*)    HCT 32.5 (*)    MCV 108.0 (*)    Lymphs Abs 0.3 (*)    All other components within normal limits  RESP PANEL BY RT-PCR (RSV, FLU A&B, COVID)  RVPGX2  CULTURE, BLOOD (ROUTINE X 2)  CULTURE, BLOOD (ROUTINE X 2)  PROTIME-INR  APTT  COMPREHENSIVE METABOLIC PANEL  URINALYSIS, W/ REFLEX TO CULTURE (INFECTION SUSPECTED)  I-STAT CG4 LACTIC ACID, ED    EKG None  Radiology No results found.  Procedures .Critical Care  Performed by: Netta Corrigan, PA-C Authorized by: Netta Corrigan, PA-C   Critical care provider statement:    Critical care time (minutes):  30   Critical care time was exclusive of:  Separately billable procedures and treating other patients   Critical care was necessary to treat or prevent imminent or life-threatening deterioration of the following conditions:  Sepsis and respiratory failure   Critical care was time spent personally by me on the following activities:  Blood draw for specimens, development of treatment plan with patient or surrogate, evaluation of patient's response to treatment, examination of patient, review of old charts,  re-evaluation of patient's condition, pulse oximetry, ordering and review of radiographic studies, ordering and review of laboratory studies, ordering and performing treatments and interventions and obtaining history from patient or surrogate   I assumed direction of critical care for this patient from another provider in my specialty: no     Care discussed with comment:  Oncoming team   {Document cardiac monitor, telemetry assessment procedure when appropriate:1}  Medications Ordered in ED Medications  lactated ringers infusion (has no administration in time range)  lactated ringers bolus 1,000 mL (has no administration in time range)  cefTRIAXone (ROCEPHIN) 2 g in sodium chloride 0.9 % 100 mL IVPB (2 g Intravenous New  Bag/Given 04/18/23 2204)  azithromycin (ZITHROMAX) 500 mg in sodium chloride 0.9 % 250 mL IVPB (has no administration in time range)    ED Course/ Medical Decision Making/ A&P   {   Click here for ABCD2, HEART and other calculatorsREFRESH Note before signing :1}                              Medical Decision Making Amount and/or Complexity of Data Reviewed Labs: ordered. Radiology: ordered.  Risk Prescription drug management.   Sheena [name] 78 y.o. presented today for sepsis.  Working DDx that I considered at this time includes, but not limited to, sepsis, bacteremia, UTI, pneumonia, meningitis/encephalitis, cellulitis, ACS, myocarditis, acidosis, dehydration, electrolyte abnormalities, PE.  R/o DDx: pending  Review of prior external notes: 04/06/2023 discharge summary  Unique Tests and My Independent Interpretation: CBC: Unremarkable CMP: Unremarkable Lactic acid: Unremarkable UA: *** Chest x-ray: Left pleural effusion, COPD, possible obstructing lesion versus aspiration aPTT: Unremarkable PT/INR: Unremarkable Blood cultures: Pending Respiratory panel: Negative EKG: Sinus 68, history of heart block, no signs of ST elevation or depressions or right  heart strain  Social Determinants of Health: Nursing facility  Discussion with Independent Historian:  EMS  Discussion of Management of Tests: {historian:29369}  Risk: High: Hospitalization  Risk Stratification Score: none  Staffed with Silverio Lay, MD  Plan: On exam patient was septic on arrival.  On exam patient ill-appearing with soft blood pressure and alert and oriented x 0.  Patient did have crackles bilaterally with a nonproductive cough suspicious of pneumonia. The cardiac monitor was ordered secondary to the patient's history of sepsis and to monitor the patient for dysrhythmia. Cardiac monitor by my independent interpretation showed normal sinus.  Will get CT as patient does have history of A-fib with recent surgery and is bedridden to rule out PE.    I spoke to the hospitalist and patient was accepted for admission.  Patient stable for admission.  This chart was dictated using voice recognition software.  Despite best efforts to proofread,  errors can occur which can change the documentation meaning.   {Document critical care time when appropriate:1} {Document review of labs and clinical decision tools ie heart score, Chads2Vasc2 etc:1}  {Document your independent review of radiology images, and any outside records:1} {Document your discussion with family members, caretakers, and with consultants:1} {Document social determinants of health affecting pt's care:1} {Document your decision making why or why not admission, treatments were needed:1} Final Clinical Impression(s) / ED Diagnoses Final diagnoses:  None    Rx / DC Orders ED Discharge Orders     None

## 2023-04-18 NOTE — Sepsis Progress Note (Signed)
 Elink following code sepsis

## 2023-04-19 ENCOUNTER — Inpatient Hospital Stay (HOSPITAL_COMMUNITY): Payer: 59

## 2023-04-19 ENCOUNTER — Emergency Department (HOSPITAL_COMMUNITY): Payer: 59

## 2023-04-19 DIAGNOSIS — E875 Hyperkalemia: Secondary | ICD-10-CM | POA: Diagnosis not present

## 2023-04-19 DIAGNOSIS — R627 Adult failure to thrive: Secondary | ICD-10-CM

## 2023-04-19 DIAGNOSIS — D62 Acute posthemorrhagic anemia: Secondary | ICD-10-CM | POA: Diagnosis not present

## 2023-04-19 DIAGNOSIS — M545 Low back pain, unspecified: Secondary | ICD-10-CM | POA: Diagnosis not present

## 2023-04-19 DIAGNOSIS — R471 Dysarthria and anarthria: Secondary | ICD-10-CM | POA: Diagnosis not present

## 2023-04-19 DIAGNOSIS — J9819 Other pulmonary collapse: Secondary | ICD-10-CM | POA: Diagnosis not present

## 2023-04-19 DIAGNOSIS — J984 Other disorders of lung: Secondary | ICD-10-CM | POA: Diagnosis not present

## 2023-04-19 DIAGNOSIS — Z66 Do not resuscitate: Secondary | ICD-10-CM | POA: Diagnosis present

## 2023-04-19 DIAGNOSIS — I10 Essential (primary) hypertension: Secondary | ICD-10-CM | POA: Diagnosis not present

## 2023-04-19 DIAGNOSIS — Z743 Need for continuous supervision: Secondary | ICD-10-CM | POA: Diagnosis not present

## 2023-04-19 DIAGNOSIS — Z7982 Long term (current) use of aspirin: Secondary | ICD-10-CM | POA: Diagnosis not present

## 2023-04-19 DIAGNOSIS — G8929 Other chronic pain: Secondary | ICD-10-CM | POA: Diagnosis not present

## 2023-04-19 DIAGNOSIS — I6782 Cerebral ischemia: Secondary | ICD-10-CM | POA: Diagnosis not present

## 2023-04-19 DIAGNOSIS — M549 Dorsalgia, unspecified: Secondary | ICD-10-CM | POA: Diagnosis not present

## 2023-04-19 DIAGNOSIS — M6281 Muscle weakness (generalized): Secondary | ICD-10-CM | POA: Diagnosis not present

## 2023-04-19 DIAGNOSIS — D638 Anemia in other chronic diseases classified elsewhere: Secondary | ICD-10-CM | POA: Diagnosis present

## 2023-04-19 DIAGNOSIS — Z4682 Encounter for fitting and adjustment of non-vascular catheter: Secondary | ICD-10-CM | POA: Diagnosis not present

## 2023-04-19 DIAGNOSIS — J9621 Acute and chronic respiratory failure with hypoxia: Secondary | ICD-10-CM | POA: Diagnosis present

## 2023-04-19 DIAGNOSIS — J948 Other specified pleural conditions: Secondary | ICD-10-CM | POA: Diagnosis not present

## 2023-04-19 DIAGNOSIS — R918 Other nonspecific abnormal finding of lung field: Secondary | ICD-10-CM | POA: Diagnosis not present

## 2023-04-19 DIAGNOSIS — F419 Anxiety disorder, unspecified: Secondary | ICD-10-CM | POA: Diagnosis present

## 2023-04-19 DIAGNOSIS — R6889 Other general symptoms and signs: Secondary | ICD-10-CM | POA: Diagnosis not present

## 2023-04-19 DIAGNOSIS — K5900 Constipation, unspecified: Secondary | ICD-10-CM | POA: Diagnosis not present

## 2023-04-19 DIAGNOSIS — J439 Emphysema, unspecified: Secondary | ICD-10-CM | POA: Diagnosis not present

## 2023-04-19 DIAGNOSIS — R6520 Severe sepsis without septic shock: Secondary | ICD-10-CM | POA: Diagnosis not present

## 2023-04-19 DIAGNOSIS — J189 Pneumonia, unspecified organism: Secondary | ICD-10-CM | POA: Diagnosis present

## 2023-04-19 DIAGNOSIS — Z1152 Encounter for screening for COVID-19: Secondary | ICD-10-CM | POA: Diagnosis not present

## 2023-04-19 DIAGNOSIS — I700 Atherosclerosis of aorta: Secondary | ICD-10-CM | POA: Diagnosis not present

## 2023-04-19 DIAGNOSIS — M25561 Pain in right knee: Secondary | ICD-10-CM | POA: Diagnosis not present

## 2023-04-19 DIAGNOSIS — J811 Chronic pulmonary edema: Secondary | ICD-10-CM | POA: Diagnosis not present

## 2023-04-19 DIAGNOSIS — T796XXD Traumatic ischemia of muscle, subsequent encounter: Secondary | ICD-10-CM | POA: Diagnosis not present

## 2023-04-19 DIAGNOSIS — G9341 Metabolic encephalopathy: Secondary | ICD-10-CM | POA: Diagnosis present

## 2023-04-19 DIAGNOSIS — J9611 Chronic respiratory failure with hypoxia: Secondary | ICD-10-CM | POA: Diagnosis not present

## 2023-04-19 DIAGNOSIS — M4855XA Collapsed vertebra, not elsewhere classified, thoracolumbar region, initial encounter for fracture: Secondary | ICD-10-CM | POA: Diagnosis not present

## 2023-04-19 DIAGNOSIS — A419 Sepsis, unspecified organism: Secondary | ICD-10-CM | POA: Diagnosis not present

## 2023-04-19 DIAGNOSIS — R52 Pain, unspecified: Secondary | ICD-10-CM | POA: Diagnosis not present

## 2023-04-19 DIAGNOSIS — E43 Unspecified severe protein-calorie malnutrition: Secondary | ICD-10-CM | POA: Diagnosis not present

## 2023-04-19 DIAGNOSIS — Z7401 Bed confinement status: Secondary | ICD-10-CM | POA: Diagnosis not present

## 2023-04-19 DIAGNOSIS — B379 Candidiasis, unspecified: Secondary | ICD-10-CM | POA: Diagnosis not present

## 2023-04-19 DIAGNOSIS — C3432 Malignant neoplasm of lower lobe, left bronchus or lung: Secondary | ICD-10-CM | POA: Diagnosis not present

## 2023-04-19 DIAGNOSIS — I480 Paroxysmal atrial fibrillation: Secondary | ICD-10-CM | POA: Diagnosis present

## 2023-04-19 DIAGNOSIS — R0602 Shortness of breath: Secondary | ICD-10-CM | POA: Diagnosis not present

## 2023-04-19 DIAGNOSIS — C3490 Malignant neoplasm of unspecified part of unspecified bronchus or lung: Secondary | ICD-10-CM | POA: Diagnosis not present

## 2023-04-19 DIAGNOSIS — N179 Acute kidney failure, unspecified: Secondary | ICD-10-CM | POA: Diagnosis not present

## 2023-04-19 DIAGNOSIS — J440 Chronic obstructive pulmonary disease with acute lower respiratory infection: Secondary | ICD-10-CM | POA: Diagnosis present

## 2023-04-19 DIAGNOSIS — M4856XA Collapsed vertebra, not elsewhere classified, lumbar region, initial encounter for fracture: Secondary | ICD-10-CM | POA: Diagnosis not present

## 2023-04-19 DIAGNOSIS — Z681 Body mass index (BMI) 19 or less, adult: Secondary | ICD-10-CM | POA: Diagnosis not present

## 2023-04-19 DIAGNOSIS — J90 Pleural effusion, not elsewhere classified: Secondary | ICD-10-CM | POA: Diagnosis not present

## 2023-04-19 DIAGNOSIS — R1310 Dysphagia, unspecified: Secondary | ICD-10-CM | POA: Diagnosis present

## 2023-04-19 DIAGNOSIS — Y95 Nosocomial condition: Secondary | ICD-10-CM | POA: Diagnosis present

## 2023-04-19 DIAGNOSIS — R5381 Other malaise: Secondary | ICD-10-CM | POA: Diagnosis not present

## 2023-04-19 DIAGNOSIS — J9601 Acute respiratory failure with hypoxia: Secondary | ICD-10-CM | POA: Diagnosis not present

## 2023-04-19 DIAGNOSIS — M25461 Effusion, right knee: Secondary | ICD-10-CM | POA: Diagnosis not present

## 2023-04-19 DIAGNOSIS — Z515 Encounter for palliative care: Secondary | ICD-10-CM | POA: Diagnosis not present

## 2023-04-19 DIAGNOSIS — R262 Difficulty in walking, not elsewhere classified: Secondary | ICD-10-CM | POA: Diagnosis not present

## 2023-04-19 DIAGNOSIS — S72141D Displaced intertrochanteric fracture of right femur, subsequent encounter for closed fracture with routine healing: Secondary | ICD-10-CM | POA: Diagnosis not present

## 2023-04-19 DIAGNOSIS — Z48813 Encounter for surgical aftercare following surgery on the respiratory system: Secondary | ICD-10-CM | POA: Diagnosis not present

## 2023-04-19 DIAGNOSIS — E785 Hyperlipidemia, unspecified: Secondary | ICD-10-CM | POA: Diagnosis not present

## 2023-04-19 DIAGNOSIS — Z85118 Personal history of other malignant neoplasm of bronchus and lung: Secondary | ICD-10-CM | POA: Diagnosis not present

## 2023-04-19 DIAGNOSIS — D539 Nutritional anemia, unspecified: Secondary | ICD-10-CM | POA: Diagnosis not present

## 2023-04-19 DIAGNOSIS — R008 Other abnormalities of heart beat: Secondary | ICD-10-CM | POA: Diagnosis not present

## 2023-04-19 DIAGNOSIS — J9811 Atelectasis: Secondary | ICD-10-CM | POA: Diagnosis present

## 2023-04-19 DIAGNOSIS — I69822 Dysarthria following other cerebrovascular disease: Secondary | ICD-10-CM | POA: Diagnosis not present

## 2023-04-19 DIAGNOSIS — L449 Papulosquamous disorder, unspecified: Secondary | ICD-10-CM | POA: Diagnosis not present

## 2023-04-19 DIAGNOSIS — R64 Cachexia: Secondary | ICD-10-CM | POA: Diagnosis present

## 2023-04-19 DIAGNOSIS — I517 Cardiomegaly: Secondary | ICD-10-CM | POA: Diagnosis not present

## 2023-04-19 LAB — BODY FLUID CELL COUNT WITH DIFFERENTIAL
Eos, Fluid: 0 %
Lymphs, Fluid: 23 %
Monocyte-Macrophage-Serous Fluid: 56 % (ref 50–90)
Neutrophil Count, Fluid: 21 % (ref 0–25)
Total Nucleated Cell Count, Fluid: 174 uL (ref 0–1000)

## 2023-04-19 LAB — LACTATE DEHYDROGENASE, PLEURAL OR PERITONEAL FLUID: LD, Fluid: 69 U/L — ABNORMAL HIGH (ref 3–23)

## 2023-04-19 LAB — MRSA NEXT GEN BY PCR, NASAL: MRSA by PCR Next Gen: NOT DETECTED

## 2023-04-19 LAB — PROTEIN, PLEURAL OR PERITONEAL FLUID: Total protein, fluid: <3 g/dL

## 2023-04-19 LAB — PROCALCITONIN: Procalcitonin: <0.1 ng/mL

## 2023-04-19 LAB — BRAIN NATRIURETIC PEPTIDE: B Natriuretic Peptide: 590.1 pg/mL — ABNORMAL HIGH (ref 0.0–100.0)

## 2023-04-19 MED ORDER — ONDANSETRON HCL 4 MG/2ML IJ SOLN
4.0000 mg | Freq: Four times a day (QID) | INTRAMUSCULAR | Status: DC | PRN
  Administered 2023-04-19 – 2023-04-29 (×7): 4 mg via INTRAVENOUS
  Filled 2023-04-19 (×7): qty 2

## 2023-04-19 MED ORDER — MAGNESIUM OXIDE -MG SUPPLEMENT 400 (240 MG) MG PO TABS
400.0000 mg | ORAL_TABLET | Freq: Every day | ORAL | Status: DC
  Administered 2023-04-20 – 2023-04-29 (×10): 400 mg via ORAL
  Filled 2023-04-19 (×10): qty 1

## 2023-04-19 MED ORDER — LINEZOLID 600 MG/300ML IV SOLN
600.0000 mg | Freq: Two times a day (BID) | INTRAVENOUS | Status: DC
  Administered 2023-04-19 – 2023-04-20 (×3): 600 mg via INTRAVENOUS
  Filled 2023-04-19 (×3): qty 300

## 2023-04-19 MED ORDER — ACETAMINOPHEN 650 MG RE SUPP: 650.0000 mg | Freq: Four times a day (QID) | RECTAL | Status: DC | PRN

## 2023-04-19 MED ORDER — IOHEXOL 350 MG/ML SOLN
80.0000 mL | Freq: Once | INTRAVENOUS | Status: AC | PRN
  Administered 2023-04-19: 80 mL via INTRAVENOUS

## 2023-04-19 MED ORDER — LACTATED RINGERS IV SOLN: INTRAVENOUS | Status: AC

## 2023-04-19 MED ORDER — OXYCODONE HCL 5 MG PO TABS
5.0000 mg | ORAL_TABLET | ORAL | Status: DC | PRN
  Administered 2023-04-20 – 2023-04-29 (×15): 5 mg via ORAL
  Filled 2023-04-19 (×15): qty 1

## 2023-04-19 MED ORDER — PIPERACILLIN-TAZOBACTAM 3.375 G IVPB
3.3750 g | Freq: Three times a day (TID) | INTRAVENOUS | Status: DC
  Administered 2023-04-19 – 2023-04-20 (×4): 3.375 g via INTRAVENOUS
  Filled 2023-04-19 (×4): qty 50

## 2023-04-19 MED ORDER — ENOXAPARIN SODIUM 30 MG/0.3ML IJ SOSY
30.0000 mg | PREFILLED_SYRINGE | INTRAMUSCULAR | Status: DC
  Administered 2023-04-19 – 2023-04-29 (×10): 30 mg via SUBCUTANEOUS
  Filled 2023-04-19 (×11): qty 0.3

## 2023-04-19 MED ORDER — ONDANSETRON HCL 4 MG PO TABS
4.0000 mg | ORAL_TABLET | Freq: Four times a day (QID) | ORAL | Status: DC | PRN
  Administered 2023-04-24 – 2023-04-28 (×4): 4 mg via ORAL
  Filled 2023-04-19 (×4): qty 1

## 2023-04-19 MED ORDER — ATORVASTATIN CALCIUM 20 MG PO TABS
20.0000 mg | ORAL_TABLET | Freq: Every evening | ORAL | Status: DC
  Administered 2023-04-19 – 2023-04-28 (×10): 20 mg via ORAL
  Filled 2023-04-19 (×10): qty 1

## 2023-04-19 MED ORDER — LIDOCAINE HCL 1 % IJ SOLN
INTRAMUSCULAR | Status: AC
  Filled 2023-04-19: qty 20

## 2023-04-19 MED ORDER — ALBUTEROL SULFATE (2.5 MG/3ML) 0.083% IN NEBU
2.5000 mg | INHALATION_SOLUTION | RESPIRATORY_TRACT | Status: DC | PRN
  Administered 2023-04-28: 2.5 mg via RESPIRATORY_TRACT
  Filled 2023-04-19: qty 3

## 2023-04-19 MED ORDER — TRAZODONE HCL 50 MG PO TABS
25.0000 mg | ORAL_TABLET | Freq: Every evening | ORAL | Status: DC | PRN
  Administered 2023-04-20 – 2023-04-25 (×4): 25 mg via ORAL
  Filled 2023-04-19 (×4): qty 1

## 2023-04-19 MED ORDER — ACETAMINOPHEN 325 MG PO TABS
650.0000 mg | ORAL_TABLET | Freq: Four times a day (QID) | ORAL | Status: DC | PRN
  Administered 2023-04-20 – 2023-04-29 (×4): 650 mg via ORAL
  Filled 2023-04-19 (×4): qty 2

## 2023-04-19 MED ORDER — BOOST PLUS PO LIQD
237.0000 mL | Freq: Two times a day (BID) | ORAL | Status: DC
  Administered 2023-04-20 – 2023-04-27 (×7): 237 mL via ORAL
  Filled 2023-04-19 (×17): qty 237

## 2023-04-19 MED ORDER — AMIODARONE HCL 200 MG PO TABS
200.0000 mg | ORAL_TABLET | Freq: Every day | ORAL | Status: DC
  Administered 2023-04-20 – 2023-04-29 (×10): 200 mg via ORAL
  Filled 2023-04-19 (×11): qty 1

## 2023-04-19 MED ORDER — ASPIRIN 81 MG PO TBEC
81.0000 mg | DELAYED_RELEASE_TABLET | Freq: Every day | ORAL | Status: DC
  Administered 2023-04-20 – 2023-04-29 (×10): 81 mg via ORAL
  Filled 2023-04-19 (×10): qty 1

## 2023-04-19 MED ORDER — METHOCARBAMOL 500 MG PO TABS
500.0000 mg | ORAL_TABLET | Freq: Four times a day (QID) | ORAL | Status: DC | PRN
  Administered 2023-04-20 – 2023-04-29 (×7): 500 mg via ORAL
  Filled 2023-04-19 (×8): qty 1

## 2023-04-19 MED ORDER — DOCUSATE SODIUM 100 MG PO CAPS
100.0000 mg | ORAL_CAPSULE | Freq: Two times a day (BID) | ORAL | Status: DC
  Administered 2023-04-21 – 2023-04-27 (×5): 100 mg via ORAL
  Filled 2023-04-19 (×13): qty 1

## 2023-04-19 MED ORDER — PIPERACILLIN-TAZOBACTAM 3.375 G IVPB 30 MIN
3.3750 g | Freq: Once | INTRAVENOUS | Status: AC
  Administered 2023-04-19: 3.375 g via INTRAVENOUS
  Filled 2023-04-19: qty 50

## 2023-04-19 NOTE — ED Notes (Signed)
 Called and updated daughter that patient was going down for a thoracentesis

## 2023-04-19 NOTE — ED Notes (Signed)
 ED TO INPATIENT HANDOFF REPORT  ED Nurse Name and Phone #: Nashay Brickley  S Name/Age/Gender Sheena [name] 78 y.o. female Room/Bed: WA25/WA25  Code Status   Code Status: Limited: Do not attempt resuscitation (DNR) -DNR-LIMITED -Do Not Intubate/DNI   Home/SNF/Other Skilled nursing facility Patient oriented to: self Is this baseline? No   Triage Complete: Triage complete  Chief Complaint HCAP (healthcare-associated pneumonia) [J18.9]  Triage Note Pt BIBA from [facility], c/o cough, lethargy, and chills.  Per EMS heard ronchi all over. Hx of lung cancer.  At facility due to previous hip surgery, 2L Ashley baseline. Per EMS O2 sat was 85 placed on 10L Non rebreather. Bought O2 up to 99.  BP 106/48 baseline soft pressure.  HR 60 RR 22 O2 99 CBG 147     Allergies Allergies  Allergen Reactions   Antihistamines, Diphenhydramine-Type Other (See Comments)    palpitations, feel jittery   Aspirin Other (See Comments)    Stomach cramps   Evista [Raloxifene] Other (See Comments)    Hot Flashes     Level of Care/Admitting Diagnosis ED Disposition     ED Disposition  Admit   Condition  --   Comment  Hospital Area: [facility] [[id number]]  Level of Care: Progressive [102]  Admit to Progressive based on following criteria: MULTISYSTEM THREATS such as stable sepsis, metabolic/electrolyte imbalance with or without encephalopathy that is responding to early treatment.  May admit patient to [facility] or [name] if equivalent level of care is available:: Yes  Covid Evaluation: Confirmed COVID Negative  Diagnosis: HCAP (healthcare-associated pneumonia) [161096]  Admitting Physician: Maryln Gottron [[id number]]  Attending Physician: Kirby Crigler, MIR [email or web address] [[id number]]  Certification:: I certify this patient will need inpatient services for at least 2 midnights  Expected Medical Readiness: 04/21/2023          B Medical/Surgery History Past Medical History:   Diagnosis Date   Anxiety    Heart murmur    Hypertension    Primary small cell carcinoma of lower lobe of left lung (HCC)    Scarlet fever with other complications childhood   "had to learn to work again" has had leg weakness   Past Surgical History:  Procedure Laterality Date   BRONCHIAL NEEDLE ASPIRATION BIOPSY  06/30/2022   Procedure: BRONCHIAL NEEDLE ASPIRATION BIOPSIES;  Surgeon: [name], DO;  Location: [facility] END[facility]COPY;  Service: Pulmonary;;   INTRAMEDULLARY (IM) NAIL INTERTROCHANTERIC Right 04/02/2023   Procedure: RIGHT INTRAMEDULLARY (IM) NAIL INTERTROCHANTERIC;  Surgeon: Roby Lofts, MD;  Location: [facility] OR;  Service: Orthopedics;  Laterality: Right;   IR IMAGING GUIDED PORT INSERTION  07/31/2022   IR KYPHO LUMBAR INC FX REDUCE BONE BX UNI/BIL CANNULATION INC/IMAGING  12/17/2022   IR US GUIDE VASC ACCESS LEFT  12/17/2022   KYPHOPLASTY     2014   peridontal surgery     VIDEO BRONCHOSCOPY WITH ENDOBRONCHIAL ULTRASOUND N/A 06/30/2022   Procedure: VIDEO BRONCHOSCOPY WITH ENDOBRONCHIAL ULTRASOUND;  Surgeon: [name], DO;  Location: [facility] ENDOSCOPY;  Service: Pulmonary;  Laterality: N/A;     A IV Location/Drains/Wounds Patient Lines/Drains/Airways Status     Active Line/Drains/Airways     Name Placement date Placement time Site Days   Implanted Port Right Chest --  --  Chest  --   Peripheral IV 04/18/23 20 G Distal;Posterior;Right Forearm 04/18/23  2200  Forearm  1   Peripheral IV 04/19/23 20 G Left Antecubital 04/19/23  0111  Antecubital  less than 1  Wound / Incision (Open or Dehisced) 12/17/22 Incision - Open Vertebral column Medial L1 kyphoplasty 12/17/22  1105  Vertebral column  123            Intake/Output Last 24 hours  Intake/Output Summary (Last 24 hours) at 04/19/2023 1514 Last data filed at 04/19/2023 1101 Gross per 24 hour  Intake 1451.35 ml  Output --  Net 1451.35 ml    Labs/Imaging Results for orders placed or performed during the hospital  encounter of 04/18/23 (from the past 48 hours)  Blood Culture (routine x 2)     Status: None (Preliminary result)   Collection Time: 04/18/23  1:22 AM   Specimen: BLOOD  Result Value Ref Range   Specimen Description      BLOOD LEFT ANTECUBITAL Performed at [facility], [address] [place], [place] [address]    Special Requests      BOTTLES DRAWN AEROBIC AND ANAEROBIC Blood Culture results may not be optimal due to an inadequate volume of blood received in culture bottles Performed at [facility], [address], [place], [place] [address]    Culture PENDING    Report Status PENDING   Blood Culture (routine x 2)     Status: None (Preliminary result)   Collection Time: 04/18/23  9:46 PM   Specimen: BLOOD LEFT ARM  Result Value Ref Range   Specimen Description      BLOOD LEFT ARM Performed at [facility], [address] [place], [place] [address]    Special Requests      BOTTLES DRAWN AEROBIC AND ANAEROBIC Blood Culture adequate volume Performed at [facility], [address], [place], [place] [address]    Culture      NO GROWTH < 12 HOURS Performed at [facility], [address] [place], [place] [address]    Report Status PENDING   Resp panel by RT-PCR (RSV, Flu A&B, Covid) Anterior Nasal Swab     Status: None   Collection Time: 04/18/23  9:48 PM   Specimen: Anterior Nasal Swab  Result Value Ref Range   SARS Coronavirus 2 by RT PCR NEGATIVE NEGATIVE    Comment: (NOTE) SARS-CoV-2 target nucleic acids are NOT DETECTED.  The SARS-CoV-2 RNA is generally detectable in upper respiratory specimens during the acute phase of infection. The lowest concentration of SARS-CoV-2 viral copies this assay can detect is 138 copies/mL. A negative result does not preclude SARS-Cov-2 infection and should not be used as the sole basis for treatment or other patient management decisions. A negative result may occur  with  improper specimen collection/handling, submission of specimen other than nasopharyngeal swab, presence of viral mutation(s) within the areas targeted by this assay, and inadequate number of viral copies(<138 copies/mL). A negative result must be combined with clinical observations, patient history, and epidemiological information. The expected result is Negative.  Fact Sheet for Patients:  BloggerCourse.com  Fact Sheet for Healthcare Providers:  SeriousBroker.it  This test is no t yet approved or cleared by the [place] FDA and  has been authorized for detection and/or diagnosis of SARS-CoV-2 by FDA under an Emergency Use Authorization (EUA). This EUA will remain  in effect (meaning this test can be used) for the duration of the COVID-19 declaration under Section 564(b)(1) of the Act, 21 U.S.C.section 360bbb-3(b)(1), unless the authorization is terminated  or revoked sooner.       Influenza A by PCR NEGATIVE NEGATIVE   Influenza B  by PCR NEGATIVE NEGATIVE    Comment: (NOTE) The Xpert Xpress SARS-CoV-2/FLU/RSV plus assay is intended as an aid in the diagnosis of influenza from Nasopharyngeal swab specimens and should not be used as a sole basis for treatment. Nasal washings and aspirates are unacceptable for Xpert Xpress SARS-CoV-2/FLU/RSV testing.  Fact Sheet for Patients: BloggerCourse.com  Fact Sheet for Healthcare Providers: SeriousBroker.it  This test is not yet approved or cleared by the [place] FDA and has been authorized for detection and/or diagnosis of SARS-CoV-2 by FDA under an Emergency Use Authorization (EUA). This EUA will remain in effect (meaning this test can be used) for the duration of the COVID-19 declaration under Section 564(b)(1) of the Act, 21 U.S.C. section 360bbb-3(b)(1), unless the authorization is terminated or revoked.     Resp  Syncytial Virus by PCR NEGATIVE NEGATIVE    Comment: (NOTE) Fact Sheet for Patients: BloggerCourse.com  Fact Sheet for Healthcare Providers: SeriousBroker.it  This test is not yet approved or cleared by the [place] FDA and has been authorized for detection and/or diagnosis of SARS-CoV-2 by FDA under an Emergency Use Authorization (EUA). This EUA will remain in effect (meaning this test can be used) for the duration of the COVID-19 declaration under Section 564(b)(1) of the Act, 21 U.S.C. section 360bbb-3(b)(1), unless the authorization is terminated or revoked.  Performed at [facility], [address] [place], [place] [address]   Comprehensive metabolic panel     Status: Abnormal   Collection Time: 04/18/23  9:56 PM  Result Value Ref Range   Sodium 138 135 - 145 mmol/L   Potassium 4.8 3.5 - 5.1 mmol/L   Chloride 98 98 - 111 mmol/L   CO2 29 22 - 32 mmol/L   Glucose, Bld 117 (H) 70 - 99 mg/dL    Comment: Glucose reference range applies only to samples taken after fasting for at least 8 hours.   BUN 22 8 - 23 mg/dL   Creatinine, Ser 6.04 0.44 - 1.00 mg/dL   Calcium 9.2 8.9 - 54.0 mg/dL   Total Protein 7.0 6.5 - 8.1 g/dL   Albumin 3.5 3.5 - 5.0 g/dL   AST 35 15 - 41 U/L   ALT 23 0 - 44 U/L   Alkaline Phosphatase 111 38 - 126 U/L   Total Bilirubin 0.7 0.0 - 1.2 mg/dL   GFR, Estimated >98 >11 mL/min    Comment: (NOTE) Calculated using the CKD-EPI Creatinine Equation (2021)    Anion gap 11 5 - 15    Comment: Performed at [facility], [address], [place], [place] [address]  CBC with Differential     Status: Abnormal   Collection Time: 04/18/23  9:56 PM  Result Value Ref Range   WBC 6.2 4.0 - 10.5 K/uL   RBC 3.01 (L) 3.87 - 5.11 MIL/uL   Hemoglobin 10.2 (L) 12.0 - 15.0 g/dL   HCT 29.5 (L) 62.1 - 30.8 %   MCV 108.0 (H) 80.0 - 100.0 fL   MCH 33.9 26.0 - 34.0 pg   MCHC 31.4  30.0 - 36.0 g/dL   RDW 65.7 84.6 - 96.2 %   Platelets 188 150 - 400 K/uL   nRBC 0.0 0.0 - 0.2 %   Neutrophils Relative % 86 %   Neutro Abs 5.4 1.7 - 7.7 K/uL   Lymphocytes Relative 5 %   Lymphs Abs 0.3 (L) 0.7 - 4.0 K/uL   Monocytes Relative 8 %   Monocytes Absolute 0.5  0.1 - 1.0 K/uL   Eosinophils Relative 0 %   Eosinophils Absolute 0.0 0.0 - 0.5 K/uL   Basophils Relative 0 %   Basophils Absolute 0.0 0.0 - 0.1 K/uL   Immature Granulocytes 1 %   Abs Immature Granulocytes 0.03 0.00 - 0.07 K/uL    Comment: Performed at [facility], [address], [place], [place] [address]  Protime-INR     Status: None   Collection Time: 04/18/23  9:56 PM  Result Value Ref Range   Prothrombin Time 13.9 11.4 - 15.2 seconds   INR 1.1 0.8 - 1.2    Comment: (NOTE) INR goal varies based on device and disease states. Performed at [facility], [address], [place], [place] [address]   APTT     Status: None   Collection Time: 04/18/23  9:56 PM  Result Value Ref Range   aPTT 29 24 - 36 seconds    Comment: Performed at [facility], [address], [place], [place] [address]  I-Stat Lactic Acid, ED     Status: None   Collection Time: 04/18/23 10:08 PM  Result Value Ref Range   Lactic Acid, Venous 0.7 0.5 - 1.9 mmol/L   CT Angio Chest PE W/Cm &/Or Wo Cm Result Date: 04/19/2023 CLINICAL DATA:  Increasing oxygen requirement EXAM: CT ANGIOGRAPHY CHEST WITH CONTRAST TECHNIQUE: Multidetector CT imaging of the chest was performed using the standard protocol during bolus administration of intravenous contrast. Multiplanar CT image reconstructions and MIPs were obtained to evaluate the vascular anatomy. RADIATION DOSE REDUCTION: This exam was performed according to the departmental dose-optimization program which includes automated exposure control, adjustment of the mA and/or kV according to patient size and/or use of iterative reconstruction technique.  CONTRAST:  80mL OMNIPAQUE IOHEXOL 350 MG/ML SOLN COMPARISON:  Chest x-ray from earlier in the same day, CT from 01/30/2023 FINDINGS: Cardiovascular: Atherosclerotic calcifications of the thoracic aorta are noted without aneurysmal dilatation or dissection. Coronary calcifications are noted. No cardiac enlargement is seen. Right chest wall port is noted. Pulmonary artery shows a normal branching pattern bilaterally. No intraluminal filling defect to suggest pulmonary embolism is noted. Mediastinum/Nodes: Thoracic inlet is within normal limits. No hilar or mediastinal adenopathy is noted. The esophagus as visualized is within normal limits. Lungs/Pleura: Large left-sided pleural effusion is noted with mild left lower lobe consolidation. Increased in the interval from the prior exam. The right lung demonstrates inspissated material within the right lower lobe bronchial tree with right lower lobe collapse similar to that seen on prior plain film examination. Right middle lobe collapse is noted secondary to the inspissated material is well. This may represent a large mucous plug or possible aspirated material. Upper Abdomen: Visualized upper abdomen shows no acute abnormality. Musculoskeletal: No acute rib abnormality is noted. Multilevel chronic thoracic compression deformities are noted with evidence of vertebral augmentation at T12 and L1. These changes are stable from the prior exam. Review of the MIP images confirms the above findings. IMPRESSION: No evidence of pulmonary emboli. Inspissated material within the right middle and lower lobe bronchial tree with collapse of both lobes identified. Left-sided pleural effusion with associated left lower lobe consolidation. Chronic compression deformities some with evidence of prior augmentation. Aortic Atherosclerosis (ICD10-I70.0). Electronically Signed   By: Alcide Clever M.D.   On: 04/19/2023 00:54   DG Chest Port 1 View Result Date: 04/18/2023 CLINICAL DATA:  Sepsis  EXAM: PORTABLE CHEST 1 VIEW COMPARISON:  03/31/2023 FINDINGS: There is right  middle and lower lobe collapse with elevation of the right hemidiaphragm and right-sided volume loss. Right upper lobe is clear. Left lung appears hyperinflated, in keeping with changes of underlying COPD. Left pleural effusion is suspected. Right internal jugular chest port tip is seen within the superior vena cava. There is mediastinal shift to the right with obscuration of the cardiac silhouette. No acute bone abnormality. Multilevel vertebroplasty and numerous midthoracic compression deformities again noted. IMPRESSION: 1. Right middle and lower lobe collapse with elevation of the right hemidiaphragm and right-sided volume loss. Correlation for a central obstructing lesion, such as a mucous plug or aspirated foreign is recommended given its relatively rapid development. 2. COPD. 3. Suspected left pleural effusion. Electronically Signed   By: Helyn Numbers M.D.   On: 04/18/2023 22:28    Pending Labs Unresulted Labs (From admission, onward)     Start     Ordered   04/20/23 0500  Basic metabolic panel  Tomorrow morning,   R        04/19/23 0807   04/20/23 0500  CBC  Tomorrow morning,   R        04/19/23 0807   04/19/23 0830  MRSA Next Gen by PCR, Nasal  (MRSA Screening)  Once,   R        04/19/23 0830   04/18/23 2149  Urinalysis, w/ Reflex to Culture (Infection Suspected) -Urine, Clean Catch  (Septic presentation on arrival (screening labs, nursing and treatment orders for obvious sepsis))  ONCE - URGENT,   URGENT       Question:  Specimen Source  Answer:  Urine, Clean Catch   04/18/23 2148            Vitals/Pain Today's Vitals   04/19/23 1132 04/19/23 1300 04/19/23 1400 04/19/23 1513  BP: 115/81 (!) 92/45 104/66 (!) 96/51  Pulse: 68 66 70   Resp: 18 (!) 22 19   Temp: 98.9 F (37.2 C)     TempSrc: Axillary     SpO2: 97% 100% 100%   Weight:      Height:      PainSc:        Isolation Precautions No  active isolations  Medications Medications  oxyCODONE (Oxy IR/ROXICODONE) immediate release tablet 5 mg (has no administration in time range)  aspirin EC tablet 81 mg (0 mg Oral Hold 04/19/23 0900)  amiodarone (PACERONE) tablet 200 mg (0 mg Oral Hold 04/19/23 0901)  atorvastatin (LIPITOR) tablet 20 mg (has no administration in time range)  methocarbamol (ROBAXIN) tablet 500 mg (has no administration in time range)  enoxaparin (LOVENOX) injection 30 mg (30 mg Subcutaneous Given 04/19/23 0823)  acetaminophen (TYLENOL) tablet 650 mg (has no administration in time range)    Or  acetaminophen (TYLENOL) suppository 650 mg (has no administration in time range)  traZODone (DESYREL) tablet 25 mg (has no administration in time range)  ondansetron (ZOFRAN) tablet 4 mg ( Oral See Alternative 04/19/23 1233)    Or  ondansetron (ZOFRAN) injection 4 mg (4 mg Intravenous Given 04/19/23 1233)  albuterol (PROVENTIL) (2.5 MG/3ML) 0.083% nebulizer solution 2.5 mg (has no administration in time range)  linezolid (ZYVOX) IVPB 600 mg (0 mg Intravenous Stopped 04/19/23 1101)  piperacillin-tazobactam (ZOSYN) IVPB 3.375 g (3.375 g Intravenous New Bag/Given 04/19/23 1120)  lactated ringers infusion ( Intravenous New Bag/Given 04/19/23 1107)  docusate sodium (COLACE) capsule 100 mg (100 mg Oral Not Given 04/19/23 1235)  magnesium oxide (MAG-OX) tablet 400 mg (400 mg Oral Not Given 04/19/23  1235)  lactose free nutrition (Boost) liquid 237 mL (has no administration in time range)  lactated ringers bolus 1,000 mL (0 mLs Intravenous Stopped 04/19/23 0000)  cefTRIAXone (ROCEPHIN) 2 g in sodium chloride 0.9 % 100 mL IVPB (0 g Intravenous Stopped 04/18/23 2234)  azithromycin (ZITHROMAX) 500 mg in sodium chloride 0.9 % 250 mL IVPB (0 mg Intravenous Stopped 04/19/23 0027)  iohexol (OMNIPAQUE) 350 MG/ML injection 80 mL (80 mLs Intravenous Contrast Given 04/19/23 0017)  piperacillin-tazobactam (ZOSYN) IVPB 3.375 g (0 g Intravenous Stopped  04/19/23 0414)    Mobility Recent surgery on right hip - in rehab      Focused Assessments Pulmonary Assessment Handoff:  Lung sounds: Bilateral Breath Sounds: Rhonchi O2 Device: Nasal Cannula O2 Flow Rate (L/min): 6 L/min  US THORACENTESIS 04/19/2023  NEURO  CONFUSION NOTED LETHARGIC    R Recommendations: See Admitting Provider Note  Report given to:   Additional Notes:  NPO AT THIS TIME

## 2023-04-19 NOTE — Consult Note (Signed)
 NAME:  Sheena [name], MRN:  [id number], DOB:  08-30-1945, LOS: 0 ADMISSION DATE:  04/18/2023, CONSULTATION DATE:  04/19/23 REFERRING MD:  TRH, CHIEF COMPLAINT: Cough, shortness of breath  History of Present Illness:  78 year old woman limited stage small cell lung cancer presents with worsening shortness of breath and hypoxemia found to have enlarging left chronic effusion and new right effusion whom we are consulted for left effusion.  Left effusion present 01/2023.  Growing enlarging on scan in ED this admission.  New right effusion.  Uses 2 to 3 L at baseline.  Now up to 6 L.  Oxygen saturations high 90-100.  3 L adequate oxygen saturation earlier in the stay in the ED.  CT appears to have compressive atelectasis of bilateral bases due to fluid, possible retained secretions in the right middle lobe and right lower lobe with atelectasis as well.  Notably prior BNP was elevated.  Pertinent  Medical History  Lung cancer  Significant Hospital Events: Including procedures, antibiotic start and stop dates in addition to other pertinent events   2/17 admitted with worsening shortness of breath, PCCM consulted for left effusion  Interim History / Subjective:    Objective   Blood pressure (!) 96/51, pulse 70, temperature 98.9 F (37.2 C), temperature source Axillary, resp. rate 19, height 5' (1.524 m), weight 39.5 kg, SpO2 100%.        Intake/Output Summary (Last 24 hours) at 04/19/2023 1524 Last data filed at 04/19/2023 1101 Gross per 24 hour  Intake 1451.35 ml  Output --  Net 1451.35 ml   Filed Weights   04/18/23 2126  Weight: 39.5 kg    Examination: General: Chronically ill-appearing, cachectic lying in stretcher HENT: Atraumatic normocephalic Lungs: Normal work of breathing, on nasal cannula Cardiovascular: Regular rate and rhythm Abdomen: Nondistended Extremities: Warm Neuro: Alert, attentive, seems a bit encephalopathic when questions asked of her  Resolved  Hospital Problem list   N/a  Assessment & Plan:   Bilateral pleural effusions: Left chronic dating back 01/2023.  New on the right.  Bilateral nature gets question of third spacing, pulmonary venous hypertension likely exacerbated by malnutrition, hypoalbuminemia.  Fear both but certainly the left that is chronic could very likely be malignant in nature.  Never seen for it. -- IR consultation for left effusion, recommend sending for cytology, cell count, albumin, LDH, Gram stain culture --If improvement to medically consider repeat thoracentesis as needed in the future, if no improvement symptomatically no role for repeat thoracentesis -- Recommend diuresis with IV Lasix as tolerated --Repeat BNP (elevated 12/2022), if elevated consider repeat echocardiogram  Acute on chronic hypoxemic respiratory failure: With evidence of mucous plugging, inspissated material in the right lower lobe, right middle lobe with associated volume loss atelectasis.  Suspect this is neuromuscular weakness versus atelectasis.  Causing shunt physiology.  Also with compressive atelectasis causing shunt physiology.  Imaging findings can be more consistent with atelectasis as opposed to pneumonia. -- Reasonable to continue antibiotics for aspiration versus commune acquired pneumonia -- Procalcitonin sent -- Wean O2 for goal oxygen 88%, may improve with atelectasis improves with thoracentesis  Failure to thrive in adult: Appears quite malnourished.  With underlying cancer. --Consider palliative care consult  PCCM will sign off, please contact us if needed review of pleural studies or additional questions arise  Best Practice (right click and "Reselect all SmartList Selections" daily)   Per primary  Labs   CBC: Recent Labs  Lab 04/18/23 2156  WBC 6.2  NEUTROABS 5.4  HGB 10.2*  HCT 32.5*  MCV 108.0*  PLT 188    Basic Metabolic Panel: Recent Labs  Lab 04/18/23 2156  NA 138  K 4.8  CL 98  CO2 29  GLUCOSE  117*  BUN 22  CREATININE 0.90  CALCIUM 9.2   GFR: Estimated Creatinine Clearance: 33.7 mL/min (by C-G formula based on SCr of 0.9 mg/dL). Recent Labs  Lab 04/18/23 2156 04/18/23 2208  WBC 6.2  --   LATICACIDVEN  --  0.7    Liver Function Tests: Recent Labs  Lab 04/18/23 2156  AST 35  ALT 23  ALKPHOS 111  BILITOT 0.7  PROT 7.0  ALBUMIN 3.5   No results for input(s): "LIPASE", "AMYLASE" in the last 168 hours. No results for input(s): "AMMONIA" in the last 168 hours.  ABG    Component Value Date/Time   HCO3 23.1 04/01/2023 0543   TCO2 24 04/02/2023 1030   ACIDBASEDEF 2.0 04/01/2023 0543   O2SAT 95 04/01/2023 0543     Coagulation Profile: Recent Labs  Lab 04/18/23 2156  INR 1.1    Cardiac Enzymes: No results for input(s): "CKTOTAL", "CKMB", "CKMBINDEX", "TROPONINI" in the last 168 hours.  HbA1C: Hgb A1c MFr Bld  Date/Time Value Ref Range Status  01/01/2022 02:47 PM 5.9 4.6 - 6.5 % Final    Comment:    Glycemic Control Guidelines for People with Diabetes:Non Diabetic:  <6%Goal of Therapy: <7%Additional Action Suggested:  >8%   02/11/2021 04:04 PM 5.7 4.6 - 6.5 % Final    Comment:    Glycemic Control Guidelines for People with Diabetes:Non Diabetic:  <6%Goal of Therapy: <7%Additional Action Suggested:  >8%     CBG: No results for input(s): "GLUCAP" in the last 168 hours.  Review of Systems:   Unable to obtain due to mild encephalopathy  Past Medical History:  She,  has a past medical history of Anxiety, Heart murmur, Hypertension, Primary small cell carcinoma of lower lobe of left lung (HCC), and Scarlet fever with other complications (childhood).   Surgical History:   Past Surgical History:  Procedure Laterality Date   BRONCHIAL NEEDLE ASPIRATION BIOPSY  06/30/2022   Procedure: BRONCHIAL NEEDLE ASPIRATION BIOPSIES;  Surgeon: [name], DO;  Location: [facility];  Service: Pulmonary;;   INTRAMEDULLARY (IM) NAIL INTERTROCHANTERIC Right  04/02/2023   Procedure: RIGHT INTRAMEDULLARY (IM) NAIL INTERTROCHANTERIC;  Surgeon: Roby Lofts, MD;  Location: [facility] OR;  Service: Orthopedics;  Laterality: Right;   IR IMAGING GUIDED PORT INSERTION  07/31/2022   IR KYPHO LUMBAR INC FX REDUCE BONE BX UNI/BIL CANNULATION INC/IMAGING  12/17/2022   IR US GUIDE VASC ACCESS LEFT  12/17/2022   KYPHOPLASTY     2014   peridontal surgery     VIDEO BRONCHOSCOPY WITH ENDOBRONCHIAL ULTRASOUND N/A 06/30/2022   Procedure: VIDEO BRONCHOSCOPY WITH ENDOBRONCHIAL ULTRASOUND;  Surgeon: [name], DO;  Location: [facility];  Service: Pulmonary;  Laterality: N/A;     Social History:   reports that she quit smoking about 6 years ago. Her smoking use included cigarettes. She started smoking about 51 years ago. She has a 45 pack-year smoking history. She has never used smokeless tobacco. She reports that she does not drink alcohol and does not use drugs.   Family History:  Her family history includes COPD in her father; Cancer in her mother; Heart disease (age of onset: 62) in her brother; Heart disease (age of onset: 36) in her mother; Hypertension in her mother.   Allergies Allergies  Allergen Reactions   Antihistamines, Diphenhydramine-Type Other (See Comments)    palpitations, feel jittery   Aspirin Other (See Comments)    Stomach cramps   Evista [Raloxifene] Other (See Comments)    Hot Flashes      Home Medications  Prior to Admission medications   Medication Sig Start Date End Date Taking? Authorizing Provider  acetaminophen (TYLENOL) 500 MG tablet Take 1,000 mg by mouth 2 (two) times daily as needed for mild pain.   Yes [provider]  amiodarone (PACERONE) 200 MG tablet Take 1 tablet (200 mg total) by mouth daily. 10/14/22  Yes Duke, Roe Rutherford, PA  aspirin EC 81 MG tablet Take 1 tablet (81 mg total) by mouth daily. Swallow whole. 04/05/23 05/05/23 Yes McClung, Sarah A, PA-C  atorvastatin (LIPITOR) 20 MG tablet TAKE 1 TABLET BY  MOUTH EVERY DAY Patient taking differently: Take 20 mg by mouth every evening. 04/15/23  Yes [name], MD  Calcium Carb-Cholecalciferol (OYSTER SHELL CALCIUM/VIT D3) 500-5 MG-MCG TABS Take 1 tablet by mouth in the morning and at bedtime.   Yes [provider]  Calcium Citrate-Vitamin D (CALCIUM CITRATE PETITE/VIT D PO) Take 1 tablet by mouth 2 (two) times daily. 250mg - (200 unit)   Yes [provider]  cyanocobalamin 1000 MCG tablet Take 1 tablet (1,000 mcg total) by mouth daily. 04/07/23  Yes Narda Bonds, MD  docusate sodium (COLACE) 100 MG capsule Take 1 capsule (100 mg total) by mouth 2 (two) times daily. 04/06/23  Yes Narda Bonds, MD  folic acid (FOLVITE) 1 MG tablet Take 1 tablet (1 mg total) by mouth daily. 04/07/23  Yes Narda Bonds, MD  lactose free nutrition (BOOST) LIQD Take 237 mLs by mouth in the morning and at bedtime.   Yes [provider]  magnesium oxide (MAG-OX) 400 MG tablet Take 1 tablet by mouth daily. 01/22/23  Yes [provider]  methocarbamol (ROBAXIN) 500 MG tablet Take 1 tablet (500 mg total) by mouth every 6 (six) hours as needed for muscle spasms. 04/05/23  Yes West Bali, PA-C  metoprolol tartrate (LOPRESSOR) 25 MG tablet Take 12.5 mg by mouth 2 (two) times daily.   Yes [provider]  oxyCODONE (OXY IR/ROXICODONE) 5 MG immediate release tablet Take 1 tablet (5 mg total) by mouth every 4 (four) hours as needed for severe pain (pain score 7-10). Patient taking differently: Take 5 mg by mouth in the morning, at noon, and at bedtime. 04/05/23  Yes McClung, Sarah A, PA-C  polyethylene glycol (MIRALAX / GLYCOLAX) 17 g packet Take 17 g by mouth daily as needed for mild constipation. Patient taking differently: Take 17 g by mouth 2 (two) times daily. PRN order: 17 g by mouth once daily as needed for constipation 12/21/22  Yes Wouk, Wilfred Curtis, MD  prochlorperazine (COMPAZINE) 10 MG tablet Take 1 tablet (10 mg  total) by mouth every 6 (six) hours as needed for nausea or vomiting. 08/04/22  Yes Heilingoetter, Cassandra L, PA-C  witch hazel-glycerin (TUCKS) pad Apply 1 Application topically 3 (three) times daily as needed for hemorrhoids.   Yes [provider]  amLODipine (NORVASC) 5 MG tablet Take 5 mg by mouth daily. Patient not taking: Reported on 04/19/2023 01/02/23   [provider]  lidocaine-prilocaine (EMLA) cream Apply 1 Application topically as needed. Patient not taking: Reported on 04/19/2023 03/16/23   Si Gaul, MD     Critical care time: n/a     Lesia Sago  Brydon Spahr, MD See Loretha Stapler

## 2023-04-19 NOTE — Progress Notes (Signed)
 Pharmacy Antibiotic Note  Reve Crocket [facility] is a 78 y.o. female with a history of lung cancer who was recently hospitalized from 1/25 to 2/4 for right femur fracture s/p fall. She presented back to the ED on 04/18/2023 with altered mental status and c/o cough, lethargy, and chills. Chest CT on 2/16 was negative for PE, but showed a left-sided pleural effusion w/ associated left lower lobe consolidation; left lung appeared to be hyperinflated (possible underlying COPD). Patient was given one dose of Rocephin and one dose of azithromycin in the ED. Pharmacy has been consulted on 2/17 to dose Zosyn for pneumonia.   Today 04/19/23: - WBC WNL, afebrile - Scr = 0.9 mg/mL (CrCl = 33.7 mL/min)  Plan: - Start Zosyn 3.375 g IV q8h (infuse over 4 hours) - Start linezolid q12h per MD  - monitor renal function - follow blood cultures - f/u MRSA PCR ________________________________________________________________  Height: 5' (152.4 cm) Weight: 39.5 kg (87 lb) IBW/kg (Calculated) : 45.5  Temp (24hrs), Avg:97.7 F (36.5 C), Min:97.4 F (36.3 C), Max:97.9 F (36.6 C)  Recent Labs  Lab 04/18/23 2156 04/18/23 2208  WBC 6.2  --   CREATININE 0.90  --   LATICACIDVEN  --  0.7    Estimated Creatinine Clearance: 33.7 mL/min (by C-G formula based on SCr of 0.9 mg/dL).    Allergies  Allergen Reactions   Antihistamines, Diphenhydramine-Type Other (See Comments)    palpitations, feel jittery   Aspirin Other (See Comments)    Stomach cramps   Evista [Raloxifene] Other (See Comments)    Hot Flashes     Antimicrobials this admission: 2/16 azithromycin x 1 2/16 Rocephin  x 1 2/17 Zosyn >> 2/17 linezolid >>   Microbiology results: 2/16 Bcx x4: pending 2/16 COVID/Flu/RSV PCR: negative 2/17 MRSA PCR: IP  Thank you for allowing pharmacy to be a part of this patient's care.  Frederic Jericho 04/19/2023 8:48 AM

## 2023-04-19 NOTE — ED Provider Notes (Signed)
 1:36 AM Had a long conversation with patient's daughter Kathrynn Running. Daughter confirms patient is a DNR/DNI. Reports patient was very somnolent and lethargic at [facility] yesterday (04/18/23) and was not responsive to her despite being on her chronic 2L O2 via [place].  Explained CT findings to daughter as well as plan for admission. Patient has been started on abx of PNA coverage, though unclear if her CT findings are the result of aspiration. Patient was maintaining sats in the 90's on 3L via [place], but did just have an acute episode of hypoxemia into the 80's on 3L. Currently back on 10L NRB, but without overt respiratory distress. May consider HFNC if unable to titrate back down. Will position patient on left side to see if this helps oxygenation at all.   Consult placed to hospitalist for admission.  3:17 AM Patient has been titrated down to 5L via [place] with sats maintaining at 100%. Spoke with MD Segars of [facility]. Plan for hospitalist admission. Will be assessed by AM hospitalist team. MD Segars requests abx coverage be broadened to Zosyn. This has been ordered.   Results for orders placed or performed during the hospital encounter of 04/18/23  Resp panel by RT-PCR (RSV, Flu A&B, Covid) Anterior Nasal Swab   Collection Time: 04/18/23  9:48 PM   Specimen: Anterior Nasal Swab  Result Value Ref Range   SARS Coronavirus 2 by RT PCR NEGATIVE NEGATIVE   Influenza A by PCR NEGATIVE NEGATIVE   Influenza B by PCR NEGATIVE NEGATIVE   Resp Syncytial Virus by PCR NEGATIVE NEGATIVE  Comprehensive metabolic panel   Collection Time: 04/18/23  9:56 PM  Result Value Ref Range   Sodium 138 135 - 145 mmol/L   Potassium 4.8 3.5 - 5.1 mmol/L   Chloride 98 98 - 111 mmol/L   CO2 29 22 - 32 mmol/L   Glucose, Bld 117 (H) 70 - 99 mg/dL   BUN 22 8 - 23 mg/dL   Creatinine, Ser 1.61 0.44 - 1.00 mg/dL   Calcium 9.2 8.9 - 09.6 mg/dL   Total Protein 7.0 6.5 - 8.1 g/dL   Albumin 3.5 3.5 - 5.0 g/dL   AST 35 15 - 41 U/L    ALT 23 0 - 44 U/L   Alkaline Phosphatase 111 38 - 126 U/L   Total Bilirubin 0.7 0.0 - 1.2 mg/dL   GFR, Estimated >04 >54 mL/min   Anion gap 11 5 - 15  CBC with Differential   Collection Time: 04/18/23  9:56 PM  Result Value Ref Range   WBC 6.2 4.0 - 10.5 K/uL   RBC 3.01 (L) 3.87 - 5.11 MIL/uL   Hemoglobin 10.2 (L) 12.0 - 15.0 g/dL   HCT 09.8 (L) 11.9 - 14.7 %   MCV 108.0 (H) 80.0 - 100.0 fL   MCH 33.9 26.0 - 34.0 pg   MCHC 31.4 30.0 - 36.0 g/dL   RDW 82.9 56.2 - 13.0 %   Platelets 188 150 - 400 K/uL   nRBC 0.0 0.0 - 0.2 %   Neutrophils Relative % 86 %   Neutro Abs 5.4 1.7 - 7.7 K/uL   Lymphocytes Relative 5 %   Lymphs Abs 0.3 (L) 0.7 - 4.0 K/uL   Monocytes Relative 8 %   Monocytes Absolute 0.5 0.1 - 1.0 K/uL   Eosinophils Relative 0 %   Eosinophils Absolute 0.0 0.0 - 0.5 K/uL   Basophils Relative 0 %   Basophils Absolute 0.0 0.0 - 0.1 K/uL  Immature Granulocytes 1 %   Abs Immature Granulocytes 0.03 0.00 - 0.07 K/uL  Protime-INR   Collection Time: 04/18/23  9:56 PM  Result Value Ref Range   Prothrombin Time 13.9 11.4 - 15.2 seconds   INR 1.1 0.8 - 1.2  APTT   Collection Time: 04/18/23  9:56 PM  Result Value Ref Range   aPTT 29 24 - 36 seconds  I-Stat Lactic Acid, ED   Collection Time: 04/18/23 10:08 PM  Result Value Ref Range   Lactic Acid, Venous 0.7 0.5 - 1.9 mmol/L   CT Angio Chest PE W/Cm &/Or Wo Cm Result Date: 04/19/2023 CLINICAL DATA:  Increasing oxygen requirement EXAM: CT ANGIOGRAPHY CHEST WITH CONTRAST TECHNIQUE: Multidetector CT imaging of the chest was performed using the standard protocol during bolus administration of intravenous contrast. Multiplanar CT image reconstructions and MIPs were obtained to evaluate the vascular anatomy. RADIATION DOSE REDUCTION: This exam was performed according to the departmental dose-optimization program which includes automated exposure control, adjustment of the mA and/or kV according to patient size and/or use of  iterative reconstruction technique. CONTRAST:  80mL OMNIPAQUE IOHEXOL 350 MG/ML SOLN COMPARISON:  Chest x-ray from earlier in the same day, CT from 01/30/2023 FINDINGS: Cardiovascular: Atherosclerotic calcifications of the thoracic aorta are noted without aneurysmal dilatation or dissection. Coronary calcifications are noted. No cardiac enlargement is seen. Right chest wall port is noted. Pulmonary artery shows a normal branching pattern bilaterally. No intraluminal filling defect to suggest pulmonary embolism is noted. Mediastinum/Nodes: Thoracic inlet is within normal limits. No hilar or mediastinal adenopathy is noted. The esophagus as visualized is within normal limits. Lungs/Pleura: Large left-sided pleural effusion is noted with mild left lower lobe consolidation. Increased in the interval from the prior exam. The right lung demonstrates inspissated material within the right lower lobe bronchial tree with right lower lobe collapse similar to that seen on prior plain film examination. Right middle lobe collapse is noted secondary to the inspissated material is well. This may represent a large mucous plug or possible aspirated material. Upper Abdomen: Visualized upper abdomen shows no acute abnormality. Musculoskeletal: No acute rib abnormality is noted. Multilevel chronic thoracic compression deformities are noted with evidence of vertebral augmentation at T12 and L1. These changes are stable from the prior exam. Review of the MIP images confirms the above findings. IMPRESSION: No evidence of pulmonary emboli. Inspissated material within the right middle and lower lobe bronchial tree with collapse of both lobes identified. Left-sided pleural effusion with associated left lower lobe consolidation. Chronic compression deformities some with evidence of prior augmentation. Aortic Atherosclerosis (ICD10-I70.0). Electronically Signed   By: Alcide Clever M.D.   On: 04/19/2023 00:54   DG Chest Port 1 View Result  Date: 04/18/2023 CLINICAL DATA:  Sepsis EXAM: PORTABLE CHEST 1 VIEW COMPARISON:  03/31/2023 FINDINGS: There is right middle and lower lobe collapse with elevation of the right hemidiaphragm and right-sided volume loss. Right upper lobe is clear. Left lung appears hyperinflated, in keeping with changes of underlying COPD. Left pleural effusion is suspected. Right internal jugular chest port tip is seen within the superior vena cava. There is mediastinal shift to the right with obscuration of the cardiac silhouette. No acute bone abnormality. Multilevel vertebroplasty and numerous midthoracic compression deformities again noted. IMPRESSION: 1. Right middle and lower lobe collapse with elevation of the right hemidiaphragm and right-sided volume loss. Correlation for a central obstructing lesion, such as a mucous plug or aspirated foreign is recommended given its relatively rapid development. 2. COPD.  3. Suspected left pleural effusion. Electronically Signed   By: Helyn Numbers M.D.   On: 04/18/2023 22:28   DG Foot Complete Right Result Date: 04/06/2023 CLINICAL DATA:  RIGHT foot pain EXAM: RIGHT FOOT COMPLETE - 3+ VIEW COMPARISON:  None Available. FINDINGS: No fracture or dislocation of mid foot or forefoot. The phalanges are normal. The calcaneus is normal. No soft tissue abnormality. IMPRESSION: No fracture or dislocation. Electronically Signed   By: Genevive Bi M.D.   On: 04/06/2023 09:03   DG HIP PORT UNILAT W OR W/O PELVIS 1V RIGHT Result Date: 04/02/2023 CLINICAL DATA:  Status postoperative fixation of a right hip intertrochanteric fracture. EXAM: DG HIP (WITH OR WITHOUT PELVIS) 1V PORT RIGHT COMPARISON:  03/31/2023 and earlier today. FINDINGS: Interval intramedullary rod and compression screw fixation of the previously demonstrated comminuted intertrochanteric fracture on the right. Essentially anatomic position and alignment of the major fragments. Diffuse osteopenia. No new fractures. IMPRESSION:  Status post ORIF of a right hip intertrochanteric fracture. Electronically Signed   By: Beckie Salts M.D.   On: 04/02/2023 19:31   DG HIP UNILAT WITH PELVIS 2-3 VIEWS RIGHT Result Date: 04/02/2023 CLINICAL DATA:  Elective surgery. Right hip intraoperative fluoroscopy. EXAM: DG HIP (WITH OR WITHOUT PELVIS) 2-3V RIGHT COMPARISON:  AP pelvis 03/31/2023 FINDINGS: Images were performed intraoperatively without the presence of a radiologist. The patient is undergoing cephalomedullary nail fixation of the previously seen intertrochanteric fracture of the proximal right femur. No hardware complication is seen. Improved alignment. Total fluoroscopy images: 6 Total fluoroscopy time: 65 seconds Total dose: Radiation Exposure Index (as provided by the fluoroscopic device): 4.35 mGy air Kerma Please see intraoperative findings for further detail. IMPRESSION: Intraoperative fluoroscopy for cephalomedullary nail fixation of the proximal right femur. Electronically Signed   By: Neita Garnet M.D.   On: 04/02/2023 15:43   DG C-Arm 1-60 Min-No Report Result Date: 04/02/2023 Fluoroscopy was utilized by the requesting physician.  No radiographic interpretation.   DG Knee Right Port Result Date: 03/31/2023 CLINICAL DATA:  Fall.  Hip fracture. EXAM: PORTABLE RIGHT KNEE - 1-2 VIEW COMPARISON:  None Available. FINDINGS: There is diffuse osteopenia of the visualized osseous structures. No acute fracture or dislocation. No aggressive osseous lesion. No significant degenerative changes. No knee effusion or focal soft tissue swelling. No radiopaque foreign bodies. IMPRESSION: *No acute osseous abnormality of the right knee. Electronically Signed   By: Jules Schick M.D.   On: 03/31/2023 16:38   CT Head Wo Contrast Result Date: 03/31/2023 CLINICAL DATA:  Head trauma, moderate-severe; Neck trauma (Age >= 65y). Fall last night. Forehead laceration. EXAM: CT HEAD WITHOUT CONTRAST CT CERVICAL SPINE WITHOUT CONTRAST TECHNIQUE:  Multidetector CT imaging of the head and cervical spine was performed following the standard protocol without intravenous contrast. Multiplanar CT image reconstructions of the cervical spine were also generated. RADIATION DOSE REDUCTION: This exam was performed according to the departmental dose-optimization program which includes automated exposure control, adjustment of the mA and/or kV according to patient size and/or use of iterative reconstruction technique. COMPARISON:  Head MRI 01/30/2023 FINDINGS: CT HEAD FINDINGS Brain: There is no evidence of an acute infarct, intracranial hemorrhage, mass, midline shift, or extra-axial fluid collection. There is mild generalized cerebral atrophy. Cerebral white matter hypodensities are nonspecific but compatible with mild chronic small vessel ischemic disease. Vascular: Calcified atherosclerosis at the skull base. No hyperdense vessel. Skull: No acute fracture or destructive lesion. Sinuses/Orbits: The included paranasal sinuses and mastoid air cells are largely clear. Unremarkable orbits.  Other: None. CT CERVICAL SPINE FINDINGS Alignment: Cervical spine straightening. Trace anterolisthesis of C7 on T1, likely degenerative. Skull base and vertebrae: No acute fracture or suspicious osseous lesion. Moderate atlantodental degenerative changes. Soft tissues and spinal canal: No prevertebral fluid or swelling. No visible canal hematoma. Disc levels: Very mild disc degeneration for age. Up to moderate multilevel facet arthrosis. Upper chest: Emphysema. Other: Calcified atherosclerosis about the left greater than right carotid bifurcations. Partially visualized right internal jugular venous catheter. IMPRESSION: 1. No evidence of acute intracranial abnormality or cervical spine fracture. 2. Mild chronic small vessel ischemic disease. Electronically Signed   By: Sebastian Ache M.D.   On: 03/31/2023 14:34   CT Cervical Spine Wo Contrast Result Date: 03/31/2023 CLINICAL DATA:   Head trauma, moderate-severe; Neck trauma (Age >= 65y). Fall last night. Forehead laceration. EXAM: CT HEAD WITHOUT CONTRAST CT CERVICAL SPINE WITHOUT CONTRAST TECHNIQUE: Multidetector CT imaging of the head and cervical spine was performed following the standard protocol without intravenous contrast. Multiplanar CT image reconstructions of the cervical spine were also generated. RADIATION DOSE REDUCTION: This exam was performed according to the departmental dose-optimization program which includes automated exposure control, adjustment of the mA and/or kV according to patient size and/or use of iterative reconstruction technique. COMPARISON:  Head MRI 01/30/2023 FINDINGS: CT HEAD FINDINGS Brain: There is no evidence of an acute infarct, intracranial hemorrhage, mass, midline shift, or extra-axial fluid collection. There is mild generalized cerebral atrophy. Cerebral white matter hypodensities are nonspecific but compatible with mild chronic small vessel ischemic disease. Vascular: Calcified atherosclerosis at the skull base. No hyperdense vessel. Skull: No acute fracture or destructive lesion. Sinuses/Orbits: The included paranasal sinuses and mastoid air cells are largely clear. Unremarkable orbits. Other: None. CT CERVICAL SPINE FINDINGS Alignment: Cervical spine straightening. Trace anterolisthesis of C7 on T1, likely degenerative. Skull base and vertebrae: No acute fracture or suspicious osseous lesion. Moderate atlantodental degenerative changes. Soft tissues and spinal canal: No prevertebral fluid or swelling. No visible canal hematoma. Disc levels: Very mild disc degeneration for age. Up to moderate multilevel facet arthrosis. Upper chest: Emphysema. Other: Calcified atherosclerosis about the left greater than right carotid bifurcations. Partially visualized right internal jugular venous catheter. IMPRESSION: 1. No evidence of acute intracranial abnormality or cervical spine fracture. 2. Mild chronic small  vessel ischemic disease. Electronically Signed   By: Sebastian Ache M.D.   On: 03/31/2023 14:34   DG Pelvis Portable Result Date: 03/31/2023 CLINICAL DATA:  Fall, right hip pain EXAM: PORTABLE PELVIS 1-2 VIEWS COMPARISON:  12/09/2022 FINDINGS: Acute, comminuted intertrochanteric fracture of the proximal right femur with approximately 90 degrees of varus angulation. Hip joint intact without dislocation. Bony pelvis intact without fracture or diastasis. Soft tissue swelling is evident the lateral aspect of the right hip. IMPRESSION: Acute, comminuted intertrochanteric fracture of the proximal right femur with varus angulation. Electronically Signed   By: Duanne Guess D.O.   On: 03/31/2023 14:23   DG Chest Portable 1 View Result Date: 03/31/2023 CLINICAL DATA:  Fall and right hip fracture. EXAM: PORTABLE CHEST 1 VIEW COMPARISON:  Chest radiograph dated 12/20/2022. FINDINGS: Right-sided Port-A-Cath in similar position. The background of emphysema. No focal consolidation, pleural effusion, or pneumothorax. The cardiac silhouette is within normal limits. Atherosclerotic calcification of the aorta. Lower thoracic vertebroplasty. No acute osseous pathology. IMPRESSION: 1. No active disease. 2. Emphysema. Electronically Signed   By: Elgie Collard M.D.   On: 03/31/2023 14:22      Antony Madura, PA-C 04/19/23 0320  Nira Conn, MD 04/19/23 [phone]

## 2023-04-19 NOTE — ED Notes (Signed)
 Hospitalist at bedside

## 2023-04-19 NOTE — ED Notes (Signed)
 Patient transported to CT

## 2023-04-19 NOTE — Progress Notes (Signed)
 Called to PT room for low Sp02. On arrival PT on NRB and 6 LPM nasal cannula with Sp02 100% and no respiratory distress (good waveform). RT removed nasal cannula and left NRB ( 15 lpm)- Sp02 remained 100%. RT removed NRB and added 6 LPM nasal cannula- Sp02 remained 100%. RT decreased nasal cannula to 5 lpm- current Sp02 100%. RT suggested placing pulse ox and BP cuff on opposing arms. RN aware.   PT uses 2 lpm at home.

## 2023-04-19 NOTE — Procedures (Signed)
 PROCEDURE SUMMARY:  Successful US guided left thoracentesis. Yielded 400 mL of yellow fluid. Patient tolerated procedure well. No immediate complications. EBL = trace  Specimen was sent for labs.  Post procedure chest X-ray reveals no pneumothorax  Loman Brooklyn PA-C 04/19/2023 3:31 PM

## 2023-04-19 NOTE — ED Notes (Signed)
Patient transported to [place] for Thoracentesis.

## 2023-04-19 NOTE — Progress Notes (Signed)
 ED Pharmacy Antibiotic Sign Off An antibiotic consult was received from an ED provider for Zosyn per pharmacy dosing for aspiration pneumonia. A chart review was completed to assess appropriateness.   The following one time order(s) were placed:  Zosyn 3.375gm IV x 1  Further antibiotic and/or antibiotic pharmacy consults should be ordered by the admitting provider if indicated.   Thank you for allowing pharmacy to be a part of this patient's care.   Maryellen Pile, [facility]  Clinical Pharmacist 04/19/23 3:30 AM

## 2023-04-19 NOTE — H&P (Signed)
 History and Physical  Sheena [name] [facility] UJW:[id number] DOB: Dec 07, 1945 DOA: 04/18/2023  PCP: [name], MD   Chief Complaint: Hypoxia  HPI: Sheena [name] is a 78 y.o. female with medical history significant for hypertension, paroxysmal atrial fibrillation not on anticoagulation, breast cancer on surveillance recent hospital stay at [facility] January 2025 for hip fracture who is now being admitted to the hospital with acute on chronic hypoxic respiratory failure due to healthcare acquired pneumonia.  She is a resident of [facility], apparently she wears 2 L nasal cannula oxygen at baseline, at her facility last night she was found to be saturating 85%, placed on 10 L nonrebreather.  Blood pressure soft about 106/48.  She has been afebrile.  Workup as detailed below shows evidence of large left pleural effusion, right sided consolidation and collapse from atelectasis.  She was started on empiric IV antibiotics, and hospitalist contacted for admission.  Currently she is resting comfortably on 5 L nasal cannula oxygen.  Review of Systems: Please see HPI for pertinent positives and negatives. A complete 10 system review of systems are otherwise negative.  Past Medical History:  Diagnosis Date   Anxiety    Heart murmur    Hypertension    Primary small cell carcinoma of lower lobe of left lung (HCC)    Scarlet fever with other complications childhood   "had to learn to work again" has had leg weakness   Past Surgical History:  Procedure Laterality Date   BRONCHIAL NEEDLE ASPIRATION BIOPSY  06/30/2022   Procedure: BRONCHIAL NEEDLE ASPIRATION BIOPSIES;  Surgeon: [name], DO;  Location: [facility] ENDOSCOPY;  Service: Pulmonary;;   INTRAMEDULLARY (IM) NAIL INTERTROCHANTERIC Right 04/02/2023   Procedure: RIGHT INTRAMEDULLARY (IM) NAIL INTERTROCHANTERIC;  Surgeon: Roby Lofts, MD;  Location: [facility] OR;  Service: Orthopedics;  Laterality: Right;   IR IMAGING GUIDED PORT INSERTION   07/31/2022   IR KYPHO LUMBAR INC FX REDUCE BONE BX UNI/BIL CANNULATION INC/IMAGING  12/17/2022   IR US GUIDE VASC ACCESS LEFT  12/17/2022   KYPHOPLASTY     2014   peridontal surgery     VIDEO BRONCHOSCOPY WITH ENDOBRONCHIAL ULTRASOUND N/A 06/30/2022   Procedure: VIDEO BRONCHOSCOPY WITH ENDOBRONCHIAL ULTRASOUND;  Surgeon: [name], DO;  Location: [facility]OS[facility];  Service: Pulmonary;  Laterality: N/A;   Social History:  reports that she quit smoking about 6 years ago. Her smoking use included cigarettes. She started smoking about 51 years ago. She has a 45 pack-year smoking history. She has never used smokeless tobacco. She reports that she does not drink alcohol and does not use drugs.  Allergies  Allergen Reactions   Antihistamines, Diphenhydramine-Type Other (See Comments)    palpitations, feel jittery   Aspirin Other (See Comments)    Stomach cramps   Evista [Raloxifene] Other (See Comments)    Hot Flashes     Family History  Problem Relation Age of Onset   Cancer Mother        breast, spine mets   Hypertension Mother    Heart disease Mother 55       CABG 4 vessel   COPD Father        emphysema   Heart disease Brother 87       MI     Prior to Admission medications   Medication Sig Start Date End Date Taking? Authorizing Provider  acetaminophen (TYLENOL) 500 MG tablet Take 1,000 mg by mouth 2 (two) times daily as needed for mild pain.  Yes [provider]  amiodarone (PACERONE) 200 MG tablet Take 1 tablet (200 mg total) by mouth daily. 10/14/22  Yes Duke, Roe Rutherford, PA  aspirin EC 81 MG tablet Take 1 tablet (81 mg total) by mouth daily. Swallow whole. 04/05/23 05/05/23 Yes McClung, Sarah A, PA-C  atorvastatin (LIPITOR) 20 MG tablet TAKE 1 TABLET BY MOUTH EVERY DAY Patient taking differently: Take 20 mg by mouth every evening. 04/15/23  Yes [name], MD  Calcium Carb-Cholecalciferol (OYSTER SHELL CALCIUM/VIT D3) 500-5 MG-MCG TABS Take 1 tablet by mouth in  the morning and at bedtime.   Yes [provider]  Calcium Citrate-Vitamin D (CALCIUM CITRATE PETITE/VIT D PO) Take 1 tablet by mouth 2 (two) times daily. 250mg - (200 unit)   Yes [provider]  cyanocobalamin 1000 MCG tablet Take 1 tablet (1,000 mcg total) by mouth daily. 04/07/23  Yes Narda Bonds, MD  docusate sodium (COLACE) 100 MG capsule Take 1 capsule (100 mg total) by mouth 2 (two) times daily. 04/06/23  Yes Narda Bonds, MD  folic acid (FOLVITE) 1 MG tablet Take 1 tablet (1 mg total) by mouth daily. 04/07/23  Yes Narda Bonds, MD  lactose free nutrition (BOOST) LIQD Take 237 mLs by mouth in the morning and at bedtime.   Yes [provider]  magnesium oxide (MAG-OX) 400 MG tablet Take 1 tablet by mouth daily. 01/22/23  Yes [provider]  methocarbamol (ROBAXIN) 500 MG tablet Take 1 tablet (500 mg total) by mouth every 6 (six) hours as needed for muscle spasms. 04/05/23  Yes West Bali, PA-C  metoprolol tartrate (LOPRESSOR) 25 MG tablet Take 12.5 mg by mouth 2 (two) times daily.   Yes [provider]  oxyCODONE (OXY IR/ROXICODONE) 5 MG immediate release tablet Take 1 tablet (5 mg total) by mouth every 4 (four) hours as needed for severe pain (pain score 7-10). Patient taking differently: Take 5 mg by mouth in the morning, at noon, and at bedtime. 04/05/23  Yes McClung, Sarah A, PA-C  polyethylene glycol (MIRALAX / GLYCOLAX) 17 g packet Take 17 g by mouth daily as needed for mild constipation. Patient taking differently: Take 17 g by mouth 2 (two) times daily. PRN order: 17 g by mouth once daily as needed for constipation 12/21/22  Yes Wouk, Wilfred Curtis, MD  prochlorperazine (COMPAZINE) 10 MG tablet Take 1 tablet (10 mg total) by mouth every 6 (six) hours as needed for nausea or vomiting. 08/04/22  Yes Heilingoetter, Cassandra L, PA-C  witch hazel-glycerin (TUCKS) pad Apply 1 Application topically 3 (three) times daily as needed for  hemorrhoids.   Yes [provider]  amLODipine (NORVASC) 5 MG tablet Take 5 mg by mouth daily. Patient not taking: Reported on 04/19/2023 01/02/23   [provider]  lidocaine-prilocaine (EMLA) cream Apply 1 Application topically as needed. Patient not taking: Reported on 04/19/2023 03/16/23   Si Gaul, MD    Physical Exam: BP 103/67   Pulse 66   Temp (!) 97.4 F (36.3 C)   Resp 16   Ht 5' (1.524 m)   Wt 39.5 kg   SpO2 96%   BMI 16.99 kg/m  General: Thin female appearing her stated age, not in acute distress.  Resting comfortably on 5 L nasal cannula oxygen.  Currently alert and oriented x 4. Cardiovascular: RRR, no murmurs or rubs, no peripheral edema  Respiratory: On anterior examination, reduced breath sounds at the right base, and on the left side.  No active wheezing or rhonchi, minimal tachypnea. Abdomen: soft, nontender, nondistended, normal bowel tones heard  Skin: dry, no rashes  Musculoskeletal: no joint effusions, normal range of motion  Psychiatric: appropriate affect, normal speech  Neurologic: extraocular muscles intact, clear speech, moving all extremities with intact sensorium         Labs on Admission:  Basic Metabolic Panel: Recent Labs  Lab 04/18/23 2156  NA 138  K 4.8  CL 98  CO2 29  GLUCOSE 117*  BUN 22  CREATININE 0.90  CALCIUM 9.2   Liver Function Tests: Recent Labs  Lab 04/18/23 2156  AST 35  ALT 23  ALKPHOS 111  BILITOT 0.7  PROT 7.0  ALBUMIN 3.5   No results for input(s): "LIPASE", "AMYLASE" in the last 168 hours. No results for input(s): "AMMONIA" in the last 168 hours. CBC: Recent Labs  Lab 04/18/23 2156  WBC 6.2  NEUTROABS 5.4  HGB 10.2*  HCT 32.5*  MCV 108.0*  PLT 188   Cardiac Enzymes: No results for input(s): "CKTOTAL", "CKMB", "CKMBINDEX", "TROPONINI" in the last 168 hours. BNP (last 3 results) Recent Labs    12/09/22 2152  BNP 254.3*    ProBNP (last 3 results) No results for input(s):  "PROBNP" in the last 8760 hours.  CBG: No results for input(s): "GLUCAP" in the last 168 hours.  Radiological Exams on Admission: CT Angio Chest PE W/Cm &/Or Wo Cm Result Date: 04/19/2023 CLINICAL DATA:  Increasing oxygen requirement EXAM: CT ANGIOGRAPHY CHEST WITH CONTRAST TECHNIQUE: Multidetector CT imaging of the chest was performed using the standard protocol during bolus administration of intravenous contrast. Multiplanar CT image reconstructions and MIPs were obtained to evaluate the vascular anatomy. RADIATION DOSE REDUCTION: This exam was performed according to the departmental dose-optimization program which includes automated exposure control, adjustment of the mA and/or kV according to patient size and/or use of iterative reconstruction technique. CONTRAST:  80mL OMNIPAQUE IOHEXOL 350 MG/ML SOLN COMPARISON:  Chest x-ray from earlier in the same day, CT from 01/30/2023 FINDINGS: Cardiovascular: Atherosclerotic calcifications of the thoracic aorta are noted without aneurysmal dilatation or dissection. Coronary calcifications are noted. No cardiac enlargement is seen. Right chest wall port is noted. Pulmonary artery shows a normal branching pattern bilaterally. No intraluminal filling defect to suggest pulmonary embolism is noted. Mediastinum/Nodes: Thoracic inlet is within normal limits. No hilar or mediastinal adenopathy is noted. The esophagus as visualized is within normal limits. Lungs/Pleura: Large left-sided pleural effusion is noted with mild left lower lobe consolidation. Increased in the interval from the prior exam. The right lung demonstrates inspissated material within the right lower lobe bronchial tree with right lower lobe collapse similar to that seen on prior plain film examination. Right middle lobe collapse is noted secondary to the inspissated material is well. This may represent a large mucous plug or possible aspirated material. Upper Abdomen: Visualized upper abdomen shows no  acute abnormality. Musculoskeletal: No acute rib abnormality is noted. Multilevel chronic thoracic compression deformities are noted with evidence of vertebral augmentation at T12 and L1. These changes are stable from the prior exam. Review of the MIP images confirms the above findings. IMPRESSION: No evidence of pulmonary emboli. Inspissated material within the right middle and lower lobe bronchial tree with collapse of both lobes identified. Left-sided pleural effusion with associated left lower lobe consolidation. Chronic compression deformities some with evidence of prior augmentation. Aortic Atherosclerosis (ICD10-I70.0). Electronically Signed   By: Alcide Clever M.D.   On: 04/19/2023 00:54   DG Chest  Port 1 View Result Date: 04/18/2023 CLINICAL DATA:  Sepsis EXAM: PORTABLE CHEST 1 VIEW COMPARISON:  03/31/2023 FINDINGS: There is right middle and lower lobe collapse with elevation of the right hemidiaphragm and right-sided volume loss. Right upper lobe is clear. Left lung appears hyperinflated, in keeping with changes of underlying COPD. Left pleural effusion is suspected. Right internal jugular chest port tip is seen within the superior vena cava. There is mediastinal shift to the right with obscuration of the cardiac silhouette. No acute bone abnormality. Multilevel vertebroplasty and numerous midthoracic compression deformities again noted. IMPRESSION: 1. Right middle and lower lobe collapse with elevation of the right hemidiaphragm and right-sided volume loss. Correlation for a central obstructing lesion, such as a mucous plug or aspirated foreign is recommended given its relatively rapid development. 2. COPD. 3. Suspected left pleural effusion. Electronically Signed   By: Helyn Numbers M.D.   On: 04/18/2023 22:28   Assessment/Plan Jacynda Brunke [facility] is a 78 y.o. female with medical history significant for hypertension, paroxysmal atrial fibrillation not on anticoagulation, breast cancer on  surveillance recent hospital stay at [facility] January 2025 for hip fracture who is now being admitted to the hospital with acute on chronic hypoxic respiratory failure due to healthcare acquired pneumonia.  Acute on chronic hypoxic respiratory failure-I suspect due to combination of aspiration pneumonia and possible healthcare acquired pneumonia given her recent hospitalization.  She does have chronic left-sided pleural effusion which was seen back in November but has been increased in size. -Inpatient admission to progressive -Continue supplemental oxygen, with goal O2 saturation greater than 90% -Discussed with Dr. Judeth Horn of pulmonology, who will consult later this morning  Healthcare acquired pneumonia-concern due to possible left-sided consolidation, there may also be component of aspiration pneumonia given right sided atelectasis and material seen in the right bronchial tree.  However the patient has been afebrile, without leukocytosis. -Treat broadly with empiric IV Zosyn and IV vancomycin  Left-sided pleural effusion-this is somewhat chronic, but seems to have expanded in size.  Concern would be that this may be a malignant effusion given her history of lung cancer. -Diagnostic and therapeutic thoracentesis requested from IR  Paroxysmal atrial fibrillation-continue amiodarone, hold metoprolol due to soft blood pressure.  Patient is not on full anticoagulation due to history of GI bleeding.  Hyperlipidemia-Lipitor  DVT prophylaxis: Lovenox     Code Status: Limited: Do not attempt resuscitation (DNR) -DNR-LIMITED -Do Not Intubate/DNI   Consults called: Pulmonology  Admission status: The appropriate patient status for this patient is INPATIENT. Inpatient status is judged to be reasonable and necessary in order to provide the required intensity of service to ensure the patient's safety. The patient's presenting symptoms, physical exam findings, and initial radiographic and laboratory  data in the context of their chronic comorbidities is felt to place them at high risk for further clinical deterioration. Furthermore, it is not anticipated that the patient will be medically stable for discharge from the hospital within 2 midnights of admission.    I certify that at the point of admission it is my clinical judgment that the patient will require inpatient hospital care spanning beyond 2 midnights from the point of admission due to high intensity of service, high risk for further deterioration and high frequency of surveillance required  Time spent: 55 minutes  Nataleah Scioneaux Sharlette Dense MD [facility] Pager [phone]  If 7PM-7AM, please contact night-coverage [email or web address] Password [email or web address]  04/19/2023, 8:08 AM

## 2023-04-19 NOTE — ED Notes (Addendum)
 Speech therapy at bedside.  Daughter at bedside

## 2023-04-19 NOTE — ED Notes (Signed)
Called respiratory to evaluate patient

## 2023-04-19 NOTE — Evaluation (Signed)
 Clinical/Bedside Swallow Evaluation Patient Details  Name: Sheena [name] [facility] MRN: [id number] Date of Birth: [birth year]  Today's Date: 04/19/2023 Time: SLP Start Time (ACUTE ONLY): 1140 SLP Stop Time (ACUTE ONLY): 1155 SLP Time Calculation (min) (ACUTE ONLY): 15 min  Past Medical History:  Past Medical History:  Diagnosis Date   Anxiety    Heart murmur    Hypertension    Primary small cell carcinoma of lower lobe of left lung (HCC)    Scarlet fever with other complications childhood   "had to learn to work again" has had leg weakness   Past Surgical History:  Past Surgical History:  Procedure Laterality Date   BRONCHIAL NEEDLE ASPIRATION BIOPSY  06/30/2022   Procedure: BRONCHIAL NEEDLE ASPIRATION BIOPSIES;  Surgeon: [name], DO;  Location: [facility];  Service: Pulmonary;;   INTRAMEDULLARY (IM) NAIL INTERTROCHANTERIC Right 04/02/2023   Procedure: RIGHT INTRAMEDULLARY (IM) NAIL INTERTROCHANTERIC;  Surgeon: Roby Lofts, MD;  Location: [facility] OR;  Service: Orthopedics;  Laterality: Right;   IR IMAGING GUIDED PORT INSERTION  07/31/2022   IR KYPHO LUMBAR INC FX REDUCE BONE BX UNI/BIL CANNULATION INC/IMAGING  12/17/2022   IR US GUIDE VASC ACCESS LEFT  12/17/2022   KYPHOPLASTY     2014   peridontal surgery     VIDEO BRONCHOSCOPY WITH ENDOBRONCHIAL ULTRASOUND N/A 06/30/2022   Procedure: VIDEO BRONCHOSCOPY WITH ENDOBRONCHIAL ULTRASOUND;  Surgeon: [name], DO;  Location: [facility];  Service: Pulmonary;  Laterality: N/A;   HPI:  Patient is a 78 y.o. female with PMH: HTN, paroxysmal a-fib, breast cancer, recent hospital stay at [facility] in January of 2025 for hip fracture. She presented to hospital on 04/18/23 from SNF [facility]) with acute on chronic hypoxic respiratory failure due to healthcare acquired PNA. At baseline she is on 2L supplemental oxygen via [place]. She was found to be at 85% SpO2 at her facility and was placed on 10L NRB. CT angio/chest/PE showed left sided  pleural effusion with associated left lower lobe consolidation, right lung demonstrates inspissated material within the right lower lobe bronchial tree with right lower lobe collapse with radiologist reporting it may represent a large mucous plug or possible aspirated material. SLP ordered for swallow evaluation.    Assessment / Plan / Recommendation  Clinical Impression  Patient is presenting with a questionable pharyngoesophgeal phase dysphagia as per this bedside swallow evaluation. Patient with dry oral mucosa but only observed dried secretions were on lips. She was able to perform oral care via toothbrush with assistance from daughter. Following oral care, she was receptive to PO's of thin liquids (water, lemon lime soda) and puree solids (applesauce). No overt s/s aspiration with any of the PO's tested but swallow initiation did appear at least slightly delayed. In addition, patient is with confusion leading to reduced attention during PO intake. After a few sips of liquids and a bite of applesauce, patient suddenly indicating that she needed to vomit. SLP held emesis basin and she proceeded to vomit yellowish liquid. Daughter noticed some dried, likely emesis on patient's bathrobe. Daughter then told SLP she wondered if a contributing factor was staff at [facility] not dissolving or crushing up her medications as is typically done. Patient does have a h/o GERD and reportedly is on a PPI. SLP is recommending NPO at this time and will discuss with MD. She may benefit from an MBS. SLP Visit Diagnosis: Dysphagia, unspecified (R13.10)    Aspiration Risk  Mild aspiration risk;Moderate aspiration risk    Diet  Recommendation NPO    Medication Administration: Via alternative means    Other  Recommendations Oral Care Recommendations: Oral care QID    Recommendations for follow up therapy are one component of a multi-disciplinary discharge planning process, led by the attending physician.  Recommendations may  be updated based on patient status, additional functional criteria and insurance authorization.  Follow up Recommendations Other (comment) (TBD)      Assistance Recommended at Discharge    Functional Status Assessment Patient has had a recent decline in their functional status and demonstrates the ability to make significant improvements in function in a reasonable and predictable amount of time.  Frequency and Duration min 2x/week  1 week       Prognosis Prognosis for improved oropharyngeal function: Good      Swallow Study   General Date of Onset: 04/18/23 HPI: Patient is a 78 y.o. female with PMH: HTN, paroxysmal a-fib, breast cancer, recent hospital stay at [facility] in January of 2025 for hip fracture. She presented to hospital on 04/18/23 from SNF [facility]) with acute on chronic hypoxic respiratory failure due to healthcare acquired PNA. At baseline she is on 2L supplemental oxygen via [place]. She was found to be at 85% SpO2 at her facility and was placed on 10L NRB. CT angio/chest/PE showed left sided pleural effusion with associated left lower lobe consolidation, right lung demonstrates inspissated material within the right lower lobe bronchial tree with right lower lobe collapse with radiologist reporting it may represent a large mucous plug or possible aspirated material. SLP ordered for swallow evaluation. Type of Study: Bedside Swallow Evaluation Previous Swallow Assessment: none found Diet Prior to this Study: Regular;Thin liquids (Level 0) Temperature Spikes Noted: No Respiratory Status: Nasal cannula History of Recent Intubation: No Behavior/Cognition: Alert;Cooperative;Confused Oral Cavity Assessment: Dry Oral Care Completed by SLP: Yes Oral Cavity - Dentition: Adequate natural dentition Vision: Functional for self-feeding Self-Feeding Abilities: Able to feed self;Needs assist Patient Positioning: Upright in bed Baseline Vocal Quality: Low vocal intensity;Hoarse Volitional  Cough: Congested;Weak Volitional Swallow: Unable to elicit    Oral/Motor/Sensory Function Overall Oral Motor/Sensory Function: Within functional limits   Ice Chips     Thin Liquid Thin Liquid: Impaired Presentation: Straw;Self Fed Pharyngeal  Phase Impairments: Suspected delayed Swallow    Nectar Thick     Honey Thick     Puree Puree: Within functional limits Presentation: Spoon   Solid     Solid: Not tested      Angela Nevin, MA, CCC-SLP Speech Therapy

## 2023-04-20 ENCOUNTER — Inpatient Hospital Stay (HOSPITAL_COMMUNITY): Payer: 59

## 2023-04-20 DIAGNOSIS — C3432 Malignant neoplasm of lower lobe, left bronchus or lung: Secondary | ICD-10-CM

## 2023-04-20 DIAGNOSIS — R008 Other abnormalities of heart beat: Secondary | ICD-10-CM

## 2023-04-20 DIAGNOSIS — R5381 Other malaise: Secondary | ICD-10-CM

## 2023-04-20 DIAGNOSIS — R471 Dysarthria and anarthria: Secondary | ICD-10-CM

## 2023-04-20 DIAGNOSIS — J90 Pleural effusion, not elsewhere classified: Secondary | ICD-10-CM | POA: Diagnosis not present

## 2023-04-20 DIAGNOSIS — J189 Pneumonia, unspecified organism: Secondary | ICD-10-CM | POA: Diagnosis not present

## 2023-04-20 LAB — ECHOCARDIOGRAM COMPLETE
AR max vel: 1.21 cm2
AV Peak grad: 15.9 mm[Hg]
Ao pk vel: 2 m/s
Area-P 1/2: 4.21 cm2
Height: 60 in
P 1/2 time: 314 ms
S' Lateral: 2.5 cm
Weight: 1492.8 [oz_av]

## 2023-04-20 LAB — CBC
HCT: 26.8 % — ABNORMAL LOW (ref 36.0–46.0)
Hemoglobin: 8 g/dL — ABNORMAL LOW (ref 12.0–15.0)
MCH: 33.1 pg (ref 26.0–34.0)
MCHC: 29.9 g/dL — ABNORMAL LOW (ref 30.0–36.0)
MCV: 110.7 fL — ABNORMAL HIGH (ref 80.0–100.0)
Platelets: 155 10*3/uL (ref 150–400)
RBC: 2.42 MIL/uL — ABNORMAL LOW (ref 3.87–5.11)
RDW: 15 % (ref 11.5–15.5)
WBC: 7.2 10*3/uL (ref 4.0–10.5)
nRBC: 0 % (ref 0.0–0.2)

## 2023-04-20 LAB — URINALYSIS, W/ REFLEX TO CULTURE (INFECTION SUSPECTED)
Bacteria, UA: NONE SEEN
Bilirubin Urine: NEGATIVE
Glucose, UA: NEGATIVE mg/dL
Hgb urine dipstick: NEGATIVE
Ketones, ur: 5 mg/dL — AB
Leukocytes,Ua: NEGATIVE
Nitrite: NEGATIVE
Protein, ur: NEGATIVE mg/dL
Specific Gravity, Urine: 1.029 (ref 1.005–1.030)
pH: 5 (ref 5.0–8.0)

## 2023-04-20 LAB — BASIC METABOLIC PANEL
Anion gap: 11 (ref 5–15)
BUN: 17 mg/dL (ref 8–23)
CO2: 26 mmol/L (ref 22–32)
Calcium: 8.6 mg/dL — ABNORMAL LOW (ref 8.9–10.3)
Chloride: 97 mmol/L — ABNORMAL LOW (ref 98–111)
Creatinine, Ser: 0.77 mg/dL (ref 0.44–1.00)
GFR, Estimated: >60 mL/min (ref >60)
Glucose, Bld: 99 mg/dL (ref 70–99)
Potassium: 3.9 mmol/L (ref 3.5–5.1)
Sodium: 134 mmol/L — ABNORMAL LOW (ref 135–145)

## 2023-04-20 MED ORDER — VITAMIN B-12 1000 MCG PO TABS
1000.0000 ug | ORAL_TABLET | Freq: Every day | ORAL | Status: DC
  Administered 2023-04-21 – 2023-04-29 (×9): 1000 ug via ORAL
  Filled 2023-04-20 (×9): qty 1

## 2023-04-20 MED ORDER — GADOBUTROL 1 MMOL/ML IV SOLN
4.0000 mL | Freq: Once | INTRAVENOUS | Status: AC | PRN
  Administered 2023-04-20: 4 mL via INTRAVENOUS

## 2023-04-20 MED ORDER — HEPARIN SOD (PORK) LOCK FLUSH 100 UNIT/ML IV SOLN: 500.0000 [IU] | INTRAVENOUS | Status: DC | PRN

## 2023-04-20 MED ORDER — SODIUM CHLORIDE 0.9 % IV SOLN
3.0000 g | Freq: Four times a day (QID) | INTRAVENOUS | Status: AC
  Administered 2023-04-20 – 2023-04-24 (×18): 3 g via INTRAVENOUS
  Filled 2023-04-20 (×18): qty 8

## 2023-04-20 MED ORDER — SODIUM CHLORIDE 0.9% FLUSH: 10.0000 mL | INTRAVENOUS | Status: DC | PRN

## 2023-04-20 MED ORDER — MORPHINE SULFATE (PF) 2 MG/ML IV SOLN
2.0000 mg | INTRAVENOUS | Status: DC | PRN
  Administered 2023-04-22: 2 mg via INTRAVENOUS
  Filled 2023-04-20: qty 1
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
## 2023-04-20 NOTE — Progress Notes (Signed)
 Speech Language Pathology Treatment: Dysphagia  Patient Details Name: Sheena [name] [facility] MRN: [id number] DOB: Mar 16, 1945 Today's Date: 04/20/2023 Time: 7846-9629 SLP Time Calculation (min) (ACUTE ONLY): 24 min  Assessment / Plan / Recommendation Clinical Impression  Patient seen to assess tolerance of po diet - now advanced to clear liquids.  She denies issues with swallowing - nor odynophagia,  states she only had N/V.  Pt with flat affect - and appears with left lower facial asymmetry with retraction - which is new when compared to prior picture on her son's phone.   Fortunately her phonation attempts appear intact.   Pt only consumed a single sip of water via and  a single bite of jello.  Clinically swallow appeared delayed but no indication of aspiration. Very poor intake noted - as her tray was nearly full.  Recommend diet per MD due to her n/v but will follow up x1 given pt with new facial nerve deficit, pulmonary findings, limited intake observed and pending MRI.  Informed RN of findings on CN exam.    Pt is apprehensive to have MRI due to concern for pain - but encouraged her to ask RN for pain medications *if able to have some* and proceed with eval.    Continue medications with applesauce - whole - start and follow with liquids as pt reports swallowing medication is easier with applesauce.     HPI HPI: Patient is a 78 y.o. female with PMH: HTN, paroxysmal a-fib, breast cancer, recent hospital stay at [facility] in January of 2025 for hip fracture. She presented to hospital on 04/18/23 from SNF [facility]) with acute on chronic hypoxic respiratory failure due to healthcare acquired PNA. At baseline she is on 2L supplemental oxygen via [place]. She was found to be at 85% SpO2 at her facility and was placed on 10L NRB. CT angio/chest/PE showed left sided pleural effusion with associated left lower lobe consolidation, right lung demonstrates inspissated material within the right lower lobe bronchial tree  with right lower lobe collapse with radiologist reporting it may represent a large mucous plug or possible aspirated material. SLP ordered for swallow evaluation.      SLP Plan  Continue with current plan of care      Recommendations for follow up therapy are one component of a multi-disciplinary discharge planning process, led by the attending physician.  Recommendations may be updated based on patient status, additional functional criteria and insurance authorization.    Recommendations  Liquids provided via: Straw Medication Administration: Other (Comment) (start with liquids. follow with liquids) Supervision: Patient able to self feed Compensations: Slow rate;Small sips/bites Postural Changes and/or Swallow Maneuvers: Seated upright 90 degrees;Upright 30-60 min after meal                  Oral care QID     Dysphagia, unspecified (R13.10)     Continue with current plan of care   Rolena Infante, MS [facility] SLP Acute Rehab Services Office [phone]   Chales Abrahams  04/20/2023, 12:34 PM

## 2023-04-20 NOTE — Assessment & Plan Note (Signed)
-   dx 06/2022; follows with Dr. Arbutus Ped  - notified on admission; appreciate evaluation - patient already DNR; await further workup; if no considerable improvement then may need further GOC discussions with palliative care - follow up brain MRI as well - currently on surveillance  - follow up cytology from thoracentesis

## 2023-04-20 NOTE — Assessment & Plan Note (Signed)
-   s/p IM nailing of right femur fx on 04/02/23 -Has not been thriving well and rehab and having worsening deconditioning and weakness -Concern multifactorial with underlying malignancy along with advanced age with recent surgery and generalized malnourished state -PT/OT evals ordered - Low threshold for palliative care consult; oncology also consulted

## 2023-04-20 NOTE — Assessment & Plan Note (Addendum)
-   continue with PT/OT - has been at [facility] since discharge  -Okay for oxycodone for pain control along with Tylenol as needed - Morphine for breakthrough pain

## 2023-04-20 NOTE — TOC Initial Note (Signed)
 Transition of Care [facility]) - Initial/Assessment Note    Patient Details  Name: Sheena [name] MRN: [id number] Date of Birth: December 02, 1945  Transition of Care [name]) CM/SW Contact:    Larrie Kass, LCSW Phone Number: 04/20/2023, 3:18 PM  Clinical Narrative:                 Pt from [facility] and was there for SNF placement. CSW has confirmed with admission she can return with approved insurance auth.   CSW spoke with the pt's daughter, [name]. She inquired if there is another facility choices. CSW explained that the pt's information will need to be faxed out to obtain that list. Pt's daughter is agreeable to having the pt's information faxed out just to see what the pt can receive. TOC to follow.   Barriers to Discharge: Continued Medical Work up   Patient Goals and CMS Choice            Expected Discharge Plan and Services                                              Prior Living Arrangements/Services                       Activities of Daily Living   ADL Screening (condition at time of admission) Independently performs ADLs?: No Does the patient have a NEW difficulty with bathing/dressing/toileting/self-feeding that is expected to last >3 days?: Yes (Initiates electronic notice to provider for possible OT consult) Does the patient have a NEW difficulty with getting in/out of bed, walking, or climbing stairs that is expected to last >3 days?: Yes (Initiates electronic notice to provider for possible PT consult) Does the patient have a NEW difficulty with communication that is expected to last >3 days?: No Is the patient deaf or have difficulty hearing?: No Does the patient have difficulty seeing, even when wearing glasses/contacts?: No Does the patient have difficulty concentrating, remembering, or making decisions?: Yes  Permission Sought/Granted                  Emotional Assessment              Admission diagnosis:   Pleural effusion [J90] HCAP (healthcare-associated pneumonia) [J18.9] Sepsis with acute hypoxic respiratory failure without septic shock, due to unspecified organism (HCC) [A41.9, R65.20, J96.01] Patient Active Problem List   Diagnosis Date Noted   Physical deconditioning 04/20/2023   Pleural effusion 04/20/2023   Dysarthria 04/20/2023   Pneumonia 04/19/2023   Hyperkalemia 04/02/2023   Acute blood loss anemia 04/01/2023   Closed intertrochanteric fracture of hip, right, initial encounter (HCC) - s/p ORIF 04-02-2023 with IM nail 03/31/2023   Traumatic rhabdomyolysis (HCC) 03/31/2023   AKI (acute kidney injury) (HCC) 03/31/2023   Macrocytic anemia 03/31/2023   DNR (do not resuscitate)/DNI(Do Not Intubate) 03/31/2023   History of lung cancer 12/16/2022   Lumbar vertebral fracture (HCC) 12/10/2022   PAF (paroxysmal atrial fibrillation) (HCC) - not on systemic anticoagulation due to hx of GI bleeding 09/10/2022   Hypokalemia 09/10/2022   Pancytopenia (HCC) 09/07/2022   Port-A-Cath in place 08/11/2022   Primary small cell carcinoma of lower lobe of left lung (HCC) 07/08/2022   Adenopathy 06/30/2022   Aortic atherosclerosis (HCC) 01/18/2020   Grade II diastolic dysfunction 03/06/2015   Hyperlipidemia 03/05/2014   Former smoker 02/19/2014  Disequilibrium 02/19/2014   History of vertebral compression fracture 01/04/2013   Chronic low back pain 12/13/2012   Sinusitis, chronic 09/03/2012   Essential hypertension, benign 09/03/2012   Ataxia 04/01/2011   Anxiety 04/01/2011   Blurred vision 03/21/2010   Osteoporosis 03/21/2010   MURMUR 03/21/2010   Hyperglycemia 03/21/2010   PCP:  [name], MD Pharmacy:  No Pharmacies Listed    Social Drivers of Health (SDOH) Social History: SDOH Screenings   Food Insecurity: No Food Insecurity (04/19/2023)  Housing: Low Risk  (04/19/2023)  Transportation Needs: No Transportation Needs (04/19/2023)  Utilities: Not At Risk (04/19/2023)   Depression (PHQ2-9): Low Risk  (03/17/2023)  Recent Concern: Depression (PHQ2-9) - Medium Risk (02/03/2023)  Financial Resource Strain: Low Risk  (03/17/2023)  Physical Activity: Inactive (03/17/2023)  Social Connections: Socially Isolated (04/19/2023)  Stress: No Stress Concern Present (03/17/2023)  Tobacco Use: Medium Risk (04/18/2023)  Health Literacy: Adequate Health Literacy (03/17/2023)   SDOH Interventions:     Readmission Risk Interventions    10/09/2022    2:31 PM  Readmission Risk Prevention Plan  Transportation Screening Complete  PCP or Specialist Appt within 3-5 Days Complete  HRI or Home Care Consult Complete  Social Work Consult for Recovery Care Planning/Counseling Complete  Palliative Care Screening Not Applicable  Medication Review Oceanographer) Complete

## 2023-04-20 NOTE — Assessment & Plan Note (Signed)
-   Patient's son states her speech is usually very clear with no dysarthria or hesitations; this is clearly different than her baseline; there is also very subtle left facial droop and she also has right upper extremity weakness compared to the left -Prior MRI brain performed 01/30/2023 which was negative for intracranial metastatic disease but noted 10 mm lesion within zygomatic process of left temporal bone that could be a potential osseous lesion -Repeat MRI brain being obtained

## 2023-04-20 NOTE — Assessment & Plan Note (Signed)
-   Continue Lipitor although likely no further mortality benefit at this time

## 2023-04-20 NOTE — Assessment & Plan Note (Addendum)
-   per CTA chest: "Inspissated material within the right middle and lower lobe bronchial tree with collapse of both lobes identified." -Cell count unimpressive on thoracentesis - for now will continue abx, de-escalate to unasyn - follow up SLP eval - okay for CLD for now

## 2023-04-20 NOTE — Assessment & Plan Note (Signed)
-   Soft blood pressure currently -On no medications at this time

## 2023-04-20 NOTE — Progress Notes (Signed)
 Progress Note    [name] [facility]   QQV:[id number]  DOB: 15-Apr-1945  DOA: 04/18/2023     1 PCP: [name], MD  Initial CC: hypoxia, weakness  Hospital Course: [name] is a 78 y.o. female with PMH SCLC (dx 06/2022) s/p chemoradiation (declined prophylactic cranial irradiation). Also PMH PAF no AC, HTN, breast cancer. She had recent hospitalization from 03/31/2023 until 04/06/2023 due to right hip fracture and underwent surgical repair.  She was discharged to rehab and has had difficulty with recovery and worsening weakness/debility. She was brought back to the hospital due to respiratory symptoms and found to be hypoxic at [facility] requiring 10 L nonrebreather initially. Workup showed worsening left-sided pleural effusion and new right sided pleural effusion. She underwent left-sided thoracentesis removing 400 cc on 04/19/2023.  Interval History:  Son present bedside this morning as well. Patient noted to be weak/lethargic with slowed speech/mentation and some mild dysarthria. Son states this is all different than her baseline/usual state. She is also more deconditioned it seems than when she went to rehab post hip repair. In general it seems she's been declining.  She can't quite tell if she's breathing better s/p thoracentesis but her O2 requirements are lower than on admission so it's obvious that it helped some. Discussed with son the potential for reaccumulation.  Assessment and Plan: * Pneumonia - per CTA chest: "Inspissated material within the right middle and lower lobe bronchial tree with collapse of both lobes identified." -Cell count unimpressive on thoracentesis - for now will continue abx, de-escalate to unasyn - follow up SLP eval - okay for CLD for now  Dysarthria - Patient's son states her speech is usually very clear with no dysarthria or hesitations; this is clearly different than her baseline; there is also very subtle left facial droop and she also  has right upper extremity weakness compared to the left -Prior MRI brain performed 01/30/2023 which was negative for intracranial metastatic disease but noted 10 mm lesion within zygomatic process of left temporal bone that could be a potential osseous lesion -Repeat MRI brain being obtained  Pleural effusion - Seen previously on prior CT chest at least from November 2024; has now worsened -No prior thoracentesis has been performed; concern would be malignant effusion - Patient underwent thoracentesis on 04/19/2023, 400 cc removed - Follow-up thoracentesis cytology  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 Arbutus Ped  - notified on admission; appreciate evaluation - patient already DNR; await further workup; if no considerable improvement then may need further GOC discussions with palliative care - follow up brain MRI as well - currently on surveillance  - follow up cytology from thoracentesis   Physical deconditioning - s/p IM nailing of right femur fx on 04/02/23 -Has not been thriving well and rehab and having worsening deconditioning and weakness -Concern multifactorial with underlying malignancy along with advanced age with recent surgery and generalized malnourished state -PT/OT evals ordered - Low threshold for palliative care consult; oncology also consulted  Closed intertrochanteric fracture of hip, right, initial encounter [facility]) - s/p ORIF 04-02-2023 with IM nail - continue with PT/OT - has been at [facility] since discharge  -Okay for oxycodone for pain control along with Tylenol as needed - Morphine for breakthrough pain  Macrocytic anemia - low/normal B12 234 on 04/01/23 - continue supplementation   PAF (paroxysmal atrial fibrillation) (HCC) - not on systemic anticoagulation due to hx of GI bleeding - Not  on anticoagulation - Rate controlled; continue amiodarone  Hyperlipidemia - Continue Lipitor although likely no further  mortality benefit at this time  Essential hypertension, benign - Soft blood pressure currently -On no medications at this time  Anxiety - Currently on no controlled substances   Old records reviewed in assessment of this patient  Antimicrobials: Azithromycin and Rocephin 04/18/2023 x 1 Zosyn 04/19/2023 >> 04/20/2023 Zyvox 04/19/2023 >> 04/20/2023 Unasyn 04/20/2023 >> current  DVT prophylaxis:  enoxaparin (LOVENOX) injection 30 mg Start: 04/19/23 0815 SCDs Start: 04/19/23 0806   Code Status:   Code Status: Limited: Do not attempt resuscitation (DNR) -DNR-LIMITED -Do Not Intubate/DNI   Mobility Assessment (Last 72 Hours)     Mobility Assessment     Row Name 04/20/23 1306 04/20/23 1037 04/19/23 2015 04/19/23 1622     Does patient have an order for bedrest or is patient medically unstable -- No - Continue assessment No - Continue assessment No - Continue assessment    What is the highest level of mobility based on the progressive mobility assessment? Level 2 (Chairfast) - Balance while sitting on edge of bed and cannot stand Level 2 (Chairfast) - Balance while sitting on edge of bed and cannot stand Level 4 (Walks with assist in room) - Balance while marching in place and cannot step forward and back - Complete Level 4 (Walks with assist in room) - Balance while marching in place and cannot step forward and back - Complete    Is the above level different from baseline mobility prior to current illness? -- Yes - Recommend PT order No - Consider discontinuing PT/OT Yes - Recommend PT order             Barriers to discharge: none Disposition Plan:  SNF Status is: Inpt  Objective: Blood pressure (!) 115/50, pulse 87, temperature 98.4 F (36.9 C), temperature source Oral, resp. rate 19, height 5' (1.524 m), weight 42.3 kg, SpO2 95%.  Examination:  Physical Exam Constitutional:      Comments: Lethargic appearing; NAD; slow mentation  HENT:     Head: Normocephalic and atraumatic.      Mouth/Throat:     Mouth: Mucous membranes are moist.  Eyes:     Extraocular Movements: Extraocular movements intact.     Pupils: Pupils are equal, round, and reactive to light.  Cardiovascular:     Rate and Rhythm: Normal rate and regular rhythm.  Pulmonary:     Effort: Pulmonary effort is normal. No respiratory distress.     Breath sounds: No wheezing.     Comments: Coarse sounds bilaterally Abdominal:     General: Bowel sounds are normal. There is no distension.     Palpations: Abdomen is soft.     Tenderness: There is no abdominal tenderness.  Musculoskeletal:        General: Normal range of motion.     Cervical back: Normal range of motion and neck supple.  Skin:    General: Skin is warm and dry.  Neurological:     Cranial Nerves: Facial asymmetry present.     Comments: Subtle left facial droop; 4/5 RUE and 4/5 RLE (possibly limited by recent hip sx). Mild dysarthria appreciated      Consultants:  Oncology   Procedures:    Data Reviewed: Results for orders placed or performed during the hospital encounter of 04/18/23 (from the past 24 hours)  Lactate dehydrogenase (pleural or peritoneal fluid)     Status: Abnormal   Collection Time: 04/19/23  3:29  PM  Result Value Ref Range   LD, Fluid 69 (H) 3 - 23 U/L   Fluid Type-FLDH CYTO PLEU   Body fluid cell count with differential     Status: Abnormal   Collection Time: 04/19/23  3:29 PM  Result Value Ref Range   Fluid Type-FCT CYTO PLEU    Color, Fluid YELLOW (A) YELLOW   Appearance, Fluid CLEAR CLEAR   Total Nucleated Cell Count, Fluid 174 0 - 1,000 cu mm   Neutrophil Count, Fluid 21 0 - 25 %   Lymphs, Fluid 23 %   Monocyte-Macrophage-Serous Fluid 56 50 - 90 %   Eos, Fluid 0 %  Protein, pleural or peritoneal fluid     Status: None   Collection Time: 04/19/23  3:29 PM  Result Value Ref Range   Total protein, fluid <3.0 g/dL   Fluid Type-FTP CYTO PLEU   Body fluid culture w Gram Stain     Status: None  (Preliminary result)   Collection Time: 04/19/23  3:29 PM   Specimen: PATH Cytology Pleural fluid  Result Value Ref Range   Specimen Description      PLEURAL Performed at [facility], [address], [place], [place] [address]    Special Requests      NONE Performed at [facility], [address] [place], [place] [address]    Gram Stain NO WBC SEEN NO ORGANISMS SEEN     Culture      NO GROWTH < 24 HOURS Performed at [facility], [address] [place], [place] [address]    Report Status PENDING   Procalcitonin     Status: None   Collection Time: 04/19/23  4:46 PM  Result Value Ref Range   Procalcitonin <0.10 ng/mL  Brain natriuretic peptide     Status: Abnormal   Collection Time: 04/19/23  4:46 PM  Result Value Ref Range   B Natriuretic Peptide 590.1 (H) 0.0 - 100.0 pg/mL  MRSA Next Gen by PCR, Nasal     Status: None   Collection Time: 04/19/23  4:50 PM   Specimen: Nasal Mucosa; Nasal Swab  Result Value Ref Range   MRSA by PCR Next Gen NOT DETECTED NOT DETECTED  Basic metabolic panel     Status: Abnormal   Collection Time: 04/20/23  4:28 AM  Result Value Ref Range   Sodium 134 (L) 135 - 145 mmol/L   Potassium 3.9 3.5 - 5.1 mmol/L   Chloride 97 (L) 98 - 111 mmol/L   CO2 26 22 - 32 mmol/L   Glucose, Bld 99 70 - 99 mg/dL   BUN 17 8 - 23 mg/dL   Creatinine, Ser 2.13 0.44 - 1.00 mg/dL   Calcium 8.6 (L) 8.9 - 10.3 mg/dL   GFR, Estimated >08 >65 mL/min   Anion gap 11 5 - 15  CBC     Status: Abnormal   Collection Time: 04/20/23  4:28 AM  Result Value Ref Range   WBC 7.2 4.0 - 10.5 K/uL   RBC 2.42 (L) 3.87 - 5.11 MIL/uL   Hemoglobin 8.0 (L) 12.0 - 15.0 g/dL   HCT 78.4 (L) 69.6 - 29.5 %   MCV 110.7 (H) 80.0 - 100.0 fL   MCH 33.1 26.0 - 34.0 pg   MCHC 29.9 (L) 30.0 - 36.0 g/dL   RDW 28.4 13.2 - 44.0 %   Platelets 155 150 - 400 K/uL   nRBC 0.0 0.0 - 0.2 %  I have reviewed pertinent nursing notes, vitals, labs, and  images as necessary. I have ordered labwork to follow up on as indicated.  I have reviewed the last notes from staff over past 24 hours. I have discussed patient's care plan and test results with nursing staff, CM/SW, and other staff as appropriate.  Time spent: Greater than 50% of the 55 minute visit was spent in counseling/coordination of care for the patient as laid out in the A&P.   LOS: 1 day   Lewie Chamber, MD [facility] 04/20/2023, 1:10 PM
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
## 2023-04-20 NOTE — Progress Notes (Signed)
 Sheena [name]   DOB:[birth year]   WU#:[id number]      ASSESSMENT & PLAN:  1.  Acute on chronic hypoxic respiratory failure/pneumonia Left pleural effusion Shortness of breath - Admitted from [facility] 04/19/2023 with low O2 sats, cough, shortness of breath, lethargy, and chills. - Continue IV antibiotics as ordered - Patient had chronic left-sided pleural effusion noted in November 2024 however this has now increased in size. - Thoracentesis per IR completed 04/18/2022 with 400 mL yellow fluid removed.  Cytology pending. - Continue supportive care with O2 and diuretics as ordered  2.  Small cell lung cancer (T2a, N2, M0)  - Diagnosed April 2024. - Status post carboplatin and etoposide x 4 cycles, and with concurrent radiation therapy.  Patient declined prophylactic cranial irradiation. - Recent scan August 2024 showed no signs of disease progression and patient opted for watchful waiting approach.  Patient is scheduled for CT scan of chest, consider doing this during hospitalization. - Seen in outpatient oncology clinic on 02/03/2023.  Patient did note that she had been experiencing generalized weakness and imbalance.  MRI of brain showed small area of the skull that could potentially be bone mets. - Noted repeat MRI of head ordered.  Will follow results. - Consider palliative consult - Ordered port flush x 1 - Follows with medical oncology/Dr. Arbutus Ped  3.  Failure to thrive - Likely due to malignancy and chronic disease - Supportive care  4.  Anemia, macrocytic - Hemoglobin 8.0 with elevated MCV today - Likely due to malignancy - Transfuse PRBC for hemoglobin <7.0.  No transfusion recommended at this time. - Continue to monitor CBC with differential   Code Status DNR-limited   Subjective:  Patient seen awake and alert laying supine in bed.  Family member at bedside.  Reports that she feels terrible and stated that she fell in late January and has not felt well since.  Denies  current pain.  No shortness of breath is noted at this time.  Lungs with diminished breath sounds, no rales auscultated.  Family member asking if port can be flushed as she will miss outpatient oncology appointment for this, ordered.  No other acute distress is noted.  Objective:  Vitals:   04/20/23 0446 04/20/23 0928  BP: (!) 115/50   Pulse: 87   Resp: 19   Temp: 98.4 F (36.9 C)   SpO2: 95% 95%     Intake/Output Summary (Last 24 hours) at 04/20/2023 0957 Last data filed at 04/20/2023 2956 Gross per 24 hour  Intake 1538.67 ml  Output 625 ml  Net 913.67 ml     REVIEW OF SYSTEMS:   Constitutional: + Fatigue, denies fevers, chills or abnormal night sweats Eyes: Denies blurriness of vision, double vision or watery eyes Ears, nose, mouth, throat, and face: Denies mucositis or sore throat Respiratory: +cough, + shortness of breath Cardiovascular: Denies palpitation, chest discomfort or lower extremity swelling Gastrointestinal:  Denies nausea, heartburn or change in bowel habits Skin: Denies abnormal skin rashes Lymphatics: Denies new lymphadenopathy or easy bruising Neurological: Denies numbness, tingling or new weaknesses Behavioral/Psych: Mood is stable, no new changes  All other systems were reviewed with the patient and are negative.  PHYSICAL EXAMINATION: ECOG PERFORMANCE STATUS: 3 - Symptomatic, >50% confined to bed  Vitals:   04/20/23 0446 04/20/23 0928  BP: (!) 115/50   Pulse: 87   Resp: 19   Temp: 98.4 F (36.9 C)   SpO2: 95% 95%   Filed Weights   04/18/23 2126 04/19/23  1610  Weight: 87 lb (39.5 kg) 93 lb 4.8 oz (42.3 kg)    GENERAL: alert, + ill-appearing + frail SKIN: On pale skin color, texture, turgor are normal, no rashes or significant lesions EYES: normal, conjunctiva are pink and non-injected, sclera clear OROPHARYNX: no exudate, no erythema and lips, buccal mucosa, and tongue normal  NECK: supple, thyroid normal size, non-tender, without  nodularity LYMPH: no palpable lymphadenopathy in the cervical, axillary or inguinal LUNGS: + Diminished to auscultation and percussion with normal breathing effort HEART: regular rate & rhythm and no murmurs and no lower extremity edema ABDOMEN: abdomen soft, non-tender and normal bowel sounds MUSCULOSKELETAL: no cyanosis of digits and no clubbing  PSYCH: alert & oriented x 3 with fluent speech NEURO: no focal motor/sensory deficits   All questions were answered. The patient knows to call the clinic with any problems, questions or concerns.   The total time spent in the appointment was 55 minutes encounter with patient including review of chart and various tests results, discussions about plan of care and coordination of care plan  Dawson Bills, NP 04/20/2023 9:57 AM    Labs Reviewed:  Lab Results  Component Value Date   WBC 7.2 04/20/2023   HGB 8.0 (L) 04/20/2023   HCT 26.8 (L) 04/20/2023   MCV 110.7 (H) 04/20/2023   PLT 155 04/20/2023   Recent Labs    04/02/23 0558 04/02/23 1030 04/03/23 0515 04/18/23 2156 04/20/23 0428  NA 134*   < > 133* 138 134*  K 5.2*   < > 4.9 4.8 3.9  CL 104   < > 101 98 97*  CO2 21*  --  21* 29 26  GLUCOSE 108*   < > 140* 117* 99  BUN 29*   < > 23 22 17   CREATININE 1.02*   < > 0.93 0.90 0.77  CALCIUM 8.3*  --  8.6* 9.2 8.6*  GFRNONAA 57*  --  >60 >60 >60  PROT 5.8*  --  5.6* 7.0  --   ALBUMIN 3.1*  --  3.1* 3.5  --   AST 28  --  22 35  --   ALT 18  --  15 23  --   ALKPHOS 39  --  38 111  --   BILITOT 1.2  --  0.5 0.7  --    < > = values in this interval not displayed.    Studies Reviewed:  DG Chest 1 View Result Date: 04/19/2023 CLINICAL DATA:  Status post left-sided thoracentesis EXAM: CHEST  1 VIEW COMPARISON:  04/18/2023 FINDINGS: Right Port-A-Cath tip at superior caval/atrial junction. Patient rotated left. Mild cardiomegaly. Atherosclerosis in the transverse aorta. Resolved left pleural effusion. No pneumothorax. There may be trace  right pleural fluid. Mild interstitial prominence and indistinctness. Improved right base aeration with resolution of previously described right lower and right middle lobe collapse. Persistent left lower lobe airspace disease with volume loss. Lower thoracic vertebral augmentation. IMPRESSION: No pneumothorax after left-sided thoracentesis. Improved right and persistent left lower lung airspace disease. Cardiomegaly with mild interstitial edema. Electronically Signed   By: Jeronimo Greaves M.D.   On: 04/19/2023 17:49   US THORACENTESIS ASP PLEURAL SPACE W/IMG GUIDE Result Date: 04/19/2023 INDICATION: 78 year old woman with pneumonia and pleural effusion. Diagnostic thoracentesis. EXAM: ULTRASOUND GUIDED LEFT THORACENTESIS MEDICATIONS: 10 mL 1% lidocaine COMPLICATIONS: None immediate. PROCEDURE: An ultrasound guided thoracentesis was thoroughly discussed with the patient and questions answered. The benefits, risks, alternatives and complications were also  discussed. The patient understands and wishes to proceed with the procedure. Written consent was obtained. Ultrasound was performed to localize and mark an adequate pocket of fluid in the Left chest. The area was then prepped and draped in the normal sterile fashion. 1% Lidocaine was used for local anesthesia. Under ultrasound guidance a catheter was introduced. Thoracentesis was performed. The catheter was removed and a dressing applied. FINDINGS: A total of approximately 400 mL of yellow fluid was removed. Samples were sent to the laboratory as requested by the clinical team. IMPRESSION: Successful ultrasound guided left thoracentesis yielding 400 mL of pleural fluid. Performed By Theresa Mulligan, PA-C Electronically Signed   By: Corlis Leak M.D.   On: 04/19/2023 15:55   CT Angio Chest PE W/Cm &/Or Wo Cm Result Date: 04/19/2023 CLINICAL DATA:  Increasing oxygen requirement EXAM: CT ANGIOGRAPHY CHEST WITH CONTRAST TECHNIQUE: Multidetector CT imaging of the chest  was performed using the standard protocol during bolus administration of intravenous contrast. Multiplanar CT image reconstructions and MIPs were obtained to evaluate the vascular anatomy. RADIATION DOSE REDUCTION: This exam was performed according to the departmental dose-optimization program which includes automated exposure control, adjustment of the mA and/or kV according to patient size and/or use of iterative reconstruction technique. CONTRAST:  80mL OMNIPAQUE IOHEXOL 350 MG/ML SOLN COMPARISON:  Chest x-ray from earlier in the same day, CT from 01/30/2023 FINDINGS: Cardiovascular: Atherosclerotic calcifications of the thoracic aorta are noted without aneurysmal dilatation or dissection. Coronary calcifications are noted. No cardiac enlargement is seen. Right chest wall port is noted. Pulmonary artery shows a normal branching pattern bilaterally. No intraluminal filling defect to suggest pulmonary embolism is noted. Mediastinum/Nodes: Thoracic inlet is within normal limits. No hilar or mediastinal adenopathy is noted. The esophagus as visualized is within normal limits. Lungs/Pleura: Large left-sided pleural effusion is noted with mild left lower lobe consolidation. Increased in the interval from the prior exam. The right lung demonstrates inspissated material within the right lower lobe bronchial tree with right lower lobe collapse similar to that seen on prior plain film examination. Right middle lobe collapse is noted secondary to the inspissated material is well. This may represent a large mucous plug or possible aspirated material. Upper Abdomen: Visualized upper abdomen shows no acute abnormality. Musculoskeletal: No acute rib abnormality is noted. Multilevel chronic thoracic compression deformities are noted with evidence of vertebral augmentation at T12 and L1. These changes are stable from the prior exam. Review of the MIP images confirms the above findings. IMPRESSION: No evidence of pulmonary emboli.  Inspissated material within the right middle and lower lobe bronchial tree with collapse of both lobes identified. Left-sided pleural effusion with associated left lower lobe consolidation. Chronic compression deformities some with evidence of prior augmentation. Aortic Atherosclerosis (ICD10-I70.0). Electronically Signed   By: Alcide Clever M.D.   On: 04/19/2023 00:54   DG Chest Port 1 View Result Date: 04/18/2023 CLINICAL DATA:  Sepsis EXAM: PORTABLE CHEST 1 VIEW COMPARISON:  03/31/2023 FINDINGS: There is right middle and lower lobe collapse with elevation of the right hemidiaphragm and right-sided volume loss. Right upper lobe is clear. Left lung appears hyperinflated, in keeping with changes of underlying COPD. Left pleural effusion is suspected. Right internal jugular chest port tip is seen within the superior vena cava. There is mediastinal shift to the right with obscuration of the cardiac silhouette. No acute bone abnormality. Multilevel vertebroplasty and numerous midthoracic compression deformities again noted. IMPRESSION: 1. Right middle and lower lobe collapse with elevation of the right  hemidiaphragm and right-sided volume loss. Correlation for a central obstructing lesion, such as a mucous plug or aspirated foreign is recommended given its relatively rapid development. 2. COPD. 3. Suspected left pleural effusion. Electronically Signed   By: Helyn Numbers M.D.   On: 04/18/2023 22:28   DG Foot Complete Right Result Date: 04/06/2023 CLINICAL DATA:  RIGHT foot pain EXAM: RIGHT FOOT COMPLETE - 3+ VIEW COMPARISON:  None Available. FINDINGS: No fracture or dislocation of mid foot or forefoot. The phalanges are normal. The calcaneus is normal. No soft tissue abnormality. IMPRESSION: No fracture or dislocation. Electronically Signed   By: Genevive Bi M.D.   On: 04/06/2023 09:03   DG HIP PORT UNILAT W OR W/O PELVIS 1V RIGHT Result Date: 04/02/2023 CLINICAL DATA:  Status postoperative fixation of a  right hip intertrochanteric fracture. EXAM: DG HIP (WITH OR WITHOUT PELVIS) 1V PORT RIGHT COMPARISON:  03/31/2023 and earlier today. FINDINGS: Interval intramedullary rod and compression screw fixation of the previously demonstrated comminuted intertrochanteric fracture on the right. Essentially anatomic position and alignment of the major fragments. Diffuse osteopenia. No new fractures. IMPRESSION: Status post ORIF of a right hip intertrochanteric fracture. Electronically Signed   By: Beckie Salts M.D.   On: 04/02/2023 19:31   DG HIP UNILAT WITH PELVIS 2-3 VIEWS RIGHT Result Date: 04/02/2023 CLINICAL DATA:  Elective surgery. Right hip intraoperative fluoroscopy. EXAM: DG HIP (WITH OR WITHOUT PELVIS) 2-3V RIGHT COMPARISON:  AP pelvis 03/31/2023 FINDINGS: Images were performed intraoperatively without the presence of a radiologist. The patient is undergoing cephalomedullary nail fixation of the previously seen intertrochanteric fracture of the proximal right femur. No hardware complication is seen. Improved alignment. Total fluoroscopy images: 6 Total fluoroscopy time: 65 seconds Total dose: Radiation Exposure Index (as provided by the fluoroscopic device): 4.35 mGy air Kerma Please see intraoperative findings for further detail. IMPRESSION: Intraoperative fluoroscopy for cephalomedullary nail fixation of the proximal right femur. Electronically Signed   By: Neita Garnet M.D.   On: 04/02/2023 15:43   DG C-Arm 1-60 Min-No Report Result Date: 04/02/2023 Fluoroscopy was utilized by the requesting physician.  No radiographic interpretation.   DG Knee Right Port Result Date: 03/31/2023 CLINICAL DATA:  Fall.  Hip fracture. EXAM: PORTABLE RIGHT KNEE - 1-2 VIEW COMPARISON:  None Available. FINDINGS: There is diffuse osteopenia of the visualized osseous structures. No acute fracture or dislocation. No aggressive osseous lesion. No significant degenerative changes. No knee effusion or focal soft tissue swelling. No  radiopaque foreign bodies. IMPRESSION: *No acute osseous abnormality of the right knee. Electronically Signed   By: Jules Schick M.D.   On: 03/31/2023 16:38   CT Head Wo Contrast Result Date: 03/31/2023 CLINICAL DATA:  Head trauma, moderate-severe; Neck trauma (Age >= 65y). Fall last night. Forehead laceration. EXAM: CT HEAD WITHOUT CONTRAST CT CERVICAL SPINE WITHOUT CONTRAST TECHNIQUE: Multidetector CT imaging of the head and cervical spine was performed following the standard protocol without intravenous contrast. Multiplanar CT image reconstructions of the cervical spine were also generated. RADIATION DOSE REDUCTION: This exam was performed according to the departmental dose-optimization program which includes automated exposure control, adjustment of the mA and/or kV according to patient size and/or use of iterative reconstruction technique. COMPARISON:  Head MRI 01/30/2023 FINDINGS: CT HEAD FINDINGS Brain: There is no evidence of an acute infarct, intracranial hemorrhage, mass, midline shift, or extra-axial fluid collection. There is mild generalized cerebral atrophy. Cerebral white matter hypodensities are nonspecific but compatible with mild chronic small vessel ischemic disease. Vascular: Calcified atherosclerosis  at the skull base. No hyperdense vessel. Skull: No acute fracture or destructive lesion. Sinuses/Orbits: The included paranasal sinuses and mastoid air cells are largely clear. Unremarkable orbits. Other: None. CT CERVICAL SPINE FINDINGS Alignment: Cervical spine straightening. Trace anterolisthesis of C7 on T1, likely degenerative. Skull base and vertebrae: No acute fracture or suspicious osseous lesion. Moderate atlantodental degenerative changes. Soft tissues and spinal canal: No prevertebral fluid or swelling. No visible canal hematoma. Disc levels: Very mild disc degeneration for age. Up to moderate multilevel facet arthrosis. Upper chest: Emphysema. Other: Calcified atherosclerosis about  the left greater than right carotid bifurcations. Partially visualized right internal jugular venous catheter. IMPRESSION: 1. No evidence of acute intracranial abnormality or cervical spine fracture. 2. Mild chronic small vessel ischemic disease. Electronically Signed   By: Sebastian Ache M.D.   On: 03/31/2023 14:34   CT Cervical Spine Wo Contrast Result Date: 03/31/2023 CLINICAL DATA:  Head trauma, moderate-severe; Neck trauma (Age >= 65y). Fall last night. Forehead laceration. EXAM: CT HEAD WITHOUT CONTRAST CT CERVICAL SPINE WITHOUT CONTRAST TECHNIQUE: Multidetector CT imaging of the head and cervical spine was performed following the standard protocol without intravenous contrast. Multiplanar CT image reconstructions of the cervical spine were also generated. RADIATION DOSE REDUCTION: This exam was performed according to the departmental dose-optimization program which includes automated exposure control, adjustment of the mA and/or kV according to patient size and/or use of iterative reconstruction technique. COMPARISON:  Head MRI 01/30/2023 FINDINGS: CT HEAD FINDINGS Brain: There is no evidence of an acute infarct, intracranial hemorrhage, mass, midline shift, or extra-axial fluid collection. There is mild generalized cerebral atrophy. Cerebral white matter hypodensities are nonspecific but compatible with mild chronic small vessel ischemic disease. Vascular: Calcified atherosclerosis at the skull base. No hyperdense vessel. Skull: No acute fracture or destructive lesion. Sinuses/Orbits: The included paranasal sinuses and mastoid air cells are largely clear. Unremarkable orbits. Other: None. CT CERVICAL SPINE FINDINGS Alignment: Cervical spine straightening. Trace anterolisthesis of C7 on T1, likely degenerative. Skull base and vertebrae: No acute fracture or suspicious osseous lesion. Moderate atlantodental degenerative changes. Soft tissues and spinal canal: No prevertebral fluid or swelling. No visible  canal hematoma. Disc levels: Very mild disc degeneration for age. Up to moderate multilevel facet arthrosis. Upper chest: Emphysema. Other: Calcified atherosclerosis about the left greater than right carotid bifurcations. Partially visualized right internal jugular venous catheter. IMPRESSION: 1. No evidence of acute intracranial abnormality or cervical spine fracture. 2. Mild chronic small vessel ischemic disease. Electronically Signed   By: Sebastian Ache M.D.   On: 03/31/2023 14:34   DG Pelvis Portable Result Date: 03/31/2023 CLINICAL DATA:  Fall, right hip pain EXAM: PORTABLE PELVIS 1-2 VIEWS COMPARISON:  12/09/2022 FINDINGS: Acute, comminuted intertrochanteric fracture of the proximal right femur with approximately 90 degrees of varus angulation. Hip joint intact without dislocation. Bony pelvis intact without fracture or diastasis. Soft tissue swelling is evident the lateral aspect of the right hip. IMPRESSION: Acute, comminuted intertrochanteric fracture of the proximal right femur with varus angulation. Electronically Signed   By: Duanne Guess D.O.   On: 03/31/2023 14:23   DG Chest Portable 1 View Result Date: 03/31/2023 CLINICAL DATA:  Fall and right hip fracture. EXAM: PORTABLE CHEST 1 VIEW COMPARISON:  Chest radiograph dated 12/20/2022. FINDINGS: Right-sided Port-A-Cath in similar position. The background of emphysema. No focal consolidation, pleural effusion, or pneumothorax. The cardiac silhouette is within normal limits. Atherosclerotic calcification of the aorta. Lower thoracic vertebroplasty. No acute osseous pathology. IMPRESSION: 1. No active disease.  2. Emphysema. Electronically Signed   By: Elgie Collard M.D.   On: 03/31/2023 14:22

## 2023-04-20 NOTE — NC FL2 (Signed)
 [place] MEDICAID [facility] LEVEL OF CARE FORM     IDENTIFICATION  Patient Name: Sheena [name] [facility] Birthdate: 10/04/1945 Sex: female Admission Date (Current Location): 04/18/2023  [facility] and [place] Number:  Producer, television/film/video and Address:  [facility],  [address] [place], [place] [address]      Provider Number: [id number]  Attending Physician Name and Address:  Lewie Chamber, MD  Relative Name and Phone Number:  carty,tabitha (Daughter)  [phone] [facility])    Current Level of Care: Hospital Recommended Level of Care: Skilled Nursing Facility Prior Approval Number:    Date Approved/Denied:   PASRR Number: [id number]  Discharge Plan: SNF    Current Diagnoses: Patient Active Problem List   Diagnosis Date Noted   Physical deconditioning 04/20/2023   Pleural effusion 04/20/2023   Dysarthria 04/20/2023   Pneumonia 04/19/2023   Hyperkalemia 04/02/2023   Acute blood loss anemia 04/01/2023   Closed intertrochanteric fracture of hip, right, initial encounter (HCC) - s/p ORIF 04-02-2023 with IM nail 03/31/2023   Traumatic rhabdomyolysis (HCC) 03/31/2023   AKI (acute kidney injury) (HCC) 03/31/2023   Macrocytic anemia 03/31/2023   DNR (do not resuscitate)/DNI(Do Not Intubate) 03/31/2023   History of lung cancer 12/16/2022   Lumbar vertebral fracture (HCC) 12/10/2022   PAF (paroxysmal atrial fibrillation) (HCC) - not on systemic anticoagulation due to hx of GI bleeding 09/10/2022   Hypokalemia 09/10/2022   Pancytopenia (HCC) 09/07/2022   Port-A-Cath in place 08/11/2022   Primary small cell carcinoma of lower lobe of left lung (HCC) 07/08/2022   Adenopathy 06/30/2022   Aortic atherosclerosis (HCC) 01/18/2020   Grade II diastolic dysfunction 03/06/2015   Hyperlipidemia 03/05/2014   Former smoker 02/19/2014   Disequilibrium 02/19/2014   History of vertebral compression fracture 01/04/2013   Chronic low back pain 12/13/2012   Sinusitis, chronic 09/03/2012    Essential hypertension, benign 09/03/2012   Ataxia 04/01/2011   Anxiety 04/01/2011   Blurred vision 03/21/2010   Osteoporosis 03/21/2010   MURMUR 03/21/2010   Hyperglycemia 03/21/2010    Orientation RESPIRATION BLADDER Height & Weight     Self, Place  O2 (2L) Incontinent Weight: 93 lb 4.8 oz (42.3 kg) Height:  5' (152.4 cm)  BEHAVIORAL SYMPTOMS/MOOD NEUROLOGICAL BOWEL NUTRITION STATUS      Continent    AMBULATORY STATUS COMMUNICATION OF NEEDS Skin   Extensive Assist Verbally Normal                       Personal Care Assistance Level of Assistance  Bathing, Feeding, Dressing Bathing Assistance: Limited assistance Feeding assistance: Independent Dressing Assistance: Limited assistance Total Care Assistance: Limited assistance   Functional Limitations Info  Hearing, Sight, Speech Sight Info: Adequate Hearing Info: Adequate Speech Info: Adequate    SPECIAL CARE FACTORS FREQUENCY  OT (By licensed OT), PT (By licensed PT)     PT Frequency: 5 x a week OT Frequency: 5 x a week            Contractures Contractures Info: Not present    Additional Factors Info  Code Status, Allergies Code Status Info: DNR Allergies Info: Antihistamines, Diphenhydramine-type  Aspirin  Evista (Raloxifene)           Current Medications (04/20/2023):  This is the current hospital active medication list Current Facility-Administered Medications  Medication Dose Route Frequency Provider Last Rate Last Admin   acetaminophen (TYLENOL) tablet 650 mg  650 mg Oral Q6H PRN Kirby Crigler, Mir M, MD   650 mg at  04/20/23 0911   Or   acetaminophen (TYLENOL) suppository 650 mg  650 mg Rectal Q6H PRN Kirby Crigler, Mir M, MD       albuterol (PROVENTIL) (2.5 MG/3ML) 0.083% nebulizer solution 2.5 mg  2.5 mg Nebulization Q2H PRN Kirby Crigler, Mir M, MD       amiodarone (PACERONE) tablet 200 mg  200 mg Oral Daily Kirby Crigler, Mir M, MD   200 mg at 04/20/23 0911   Ampicillin-Sulbactam (UNASYN) 3 g in  sodium chloride 0.9 % 100 mL IVPB  3 g Intravenous Q6H Lewie Chamber, MD 200 mL/hr at 04/20/23 1501 3 g at 04/20/23 1501   aspirin EC tablet 81 mg  81 mg Oral Daily Kirby Crigler, Mir M, MD   81 mg at 04/20/23 0910   atorvastatin (LIPITOR) tablet 20 mg  20 mg Oral QPM Kirby Crigler, Mir M, MD   20 mg at 04/19/23 1828   [START ON 04/21/2023] cyanocobalamin (VITAMIN B12) tablet 1,000 mcg  1,000 mcg Oral Daily Lewie Chamber, MD       docusate sodium (COLACE) capsule 100 mg  100 mg Oral BID Kirby Crigler, Mir M, MD       enoxaparin (LOVENOX) injection 30 mg  30 mg Subcutaneous Q24H Kirby Crigler, Mir M, MD   30 mg at 04/20/23 8469   heparin lock flush 100 unit/mL  500 Units Intracatheter PRN Rouson, Dietrich Pates, NP       lactose free nutrition (BOOST PLUS) liquid 237 mL  237 mL Oral BID BM Kirby Crigler, Mir M, MD   237 mL at 04/20/23 1322   magnesium oxide (MAG-OX) tablet 400 mg  400 mg Oral Daily Kirby Crigler, Mir M, MD   400 mg at 04/20/23 6295   methocarbamol (ROBAXIN) tablet 500 mg  500 mg Oral Q6H PRN Kirby Crigler, Mir M, MD   500 mg at 04/20/23 0910   morphine (PF) 2 MG/ML injection 2 mg  2 mg Intravenous Q4H PRN Lewie Chamber, MD       ondansetron [facility]) tablet 4 mg  4 mg Oral Q6H PRN Kirby Crigler, Mir M, MD       Or   ondansetron [facility]) injection 4 mg  4 mg Intravenous Q6H PRN Kirby Crigler, Mir M, MD   4 mg at 04/19/23 1233   oxyCODONE (Oxy IR/ROXICODONE) immediate release tablet 5 mg  5 mg Oral Q4H PRN Kirby Crigler, Mir M, MD       sodium chloride flush (NS) 0.9 % injection 10 mL  10 mL Intracatheter PRN Rouson, Dietrich Pates, NP       traZODone (DESYREL) tablet 25 mg  25 mg Oral QHS PRN Maryln Gottron, MD         Discharge Medications: Please see discharge summary for a list of discharge medications.  Relevant Imaging Results:  Relevant Lab Results:   Additional Information SSN:[id number]  Valentina Shaggy Kalila Adkison, LCSW

## 2023-04-20 NOTE — Assessment & Plan Note (Signed)
-   Currently on no controlled substances

## 2023-04-20 NOTE — Assessment & Plan Note (Addendum)
-   Not on anticoagulation - Rate controlled; continue amiodarone

## 2023-04-20 NOTE — Hospital Course (Signed)
 Sheena [name] is a 78 y.o. female with PMH SCLC (dx 06/2022) s/p chemoradiation (declined prophylactic cranial irradiation). Also PMH PAF no AC, HTN, breast cancer. She had recent hospitalization from 03/31/2023 until 04/06/2023 due to right hip fracture and underwent surgical repair.  She was discharged to rehab and has had difficulty with recovery and worsening weakness/debility. She was brought back to the hospital due to respiratory symptoms and found to be hypoxic at [facility] requiring 10 L nonrebreather initially. Workup showed worsening left-sided pleural effusion and new right sided pleural effusion. She underwent left-sided thoracentesis removing 400 cc on 04/19/2023.

## 2023-04-20 NOTE — Assessment & Plan Note (Signed)
-   low/normal B12 234 on 04/01/23 - continue supplementation

## 2023-04-20 NOTE — Assessment & Plan Note (Signed)
-   Seen previously on prior CT chest at least from November 2024; has now worsened -No prior thoracentesis has been performed; concern would be malignant effusion - Patient underwent thoracentesis on 04/19/2023, 400 cc removed - Follow-up thoracentesis cytology

## 2023-04-21 DIAGNOSIS — A419 Sepsis, unspecified organism: Secondary | ICD-10-CM

## 2023-04-21 DIAGNOSIS — R6520 Severe sepsis without septic shock: Secondary | ICD-10-CM | POA: Diagnosis not present

## 2023-04-21 DIAGNOSIS — J9601 Acute respiratory failure with hypoxia: Secondary | ICD-10-CM

## 2023-04-21 LAB — BASIC METABOLIC PANEL
Anion gap: 5 (ref 5–15)
BUN: 13 mg/dL (ref 8–23)
CO2: 31 mmol/L (ref 22–32)
Calcium: 8.1 mg/dL — ABNORMAL LOW (ref 8.9–10.3)
Chloride: 100 mmol/L (ref 98–111)
Creatinine, Ser: 0.77 mg/dL (ref 0.44–1.00)
GFR, Estimated: >60 mL/min (ref >60)
Glucose, Bld: 92 mg/dL (ref 70–99)
Potassium: 3.4 mmol/L — ABNORMAL LOW (ref 3.5–5.1)
Sodium: 136 mmol/L (ref 135–145)

## 2023-04-21 LAB — MAGNESIUM: Magnesium: 1.8 mg/dL (ref 1.7–2.4)

## 2023-04-21 LAB — CBC WITH DIFFERENTIAL/PLATELET
Abs Immature Granulocytes: 0.04 10*3/uL (ref 0.00–0.07)
Basophils Absolute: 0 10*3/uL (ref 0.0–0.1)
Basophils Relative: 0 %
Eosinophils Absolute: 0.1 10*3/uL (ref 0.0–0.5)
Eosinophils Relative: 2 %
HCT: 26.4 % — ABNORMAL LOW (ref 36.0–46.0)
Hemoglobin: 7.8 g/dL — ABNORMAL LOW (ref 12.0–15.0)
Immature Granulocytes: 1 %
Lymphocytes Relative: 11 %
Lymphs Abs: 0.6 10*3/uL — ABNORMAL LOW (ref 0.7–4.0)
MCH: 33.1 pg (ref 26.0–34.0)
MCHC: 29.5 g/dL — ABNORMAL LOW (ref 30.0–36.0)
MCV: 111.9 fL — ABNORMAL HIGH (ref 80.0–100.0)
Monocytes Absolute: 0.7 10*3/uL (ref 0.1–1.0)
Monocytes Relative: 12 %
Neutro Abs: 3.9 10*3/uL (ref 1.7–7.7)
Neutrophils Relative %: 74 %
Platelets: 153 10*3/uL (ref 150–400)
RBC: 2.36 MIL/uL — ABNORMAL LOW (ref 3.87–5.11)
RDW: 15.2 % (ref 11.5–15.5)
WBC: 5.3 10*3/uL (ref 4.0–10.5)
nRBC: 0 % (ref 0.0–0.2)

## 2023-04-21 MED ORDER — FUROSEMIDE 10 MG/ML IJ SOLN
20.0000 mg | Freq: Once | INTRAMUSCULAR | Status: AC
  Administered 2023-04-21: 20 mg via INTRAVENOUS
  Filled 2023-04-21: qty 2

## 2023-04-21 MED ORDER — POTASSIUM CHLORIDE CRYS ER 20 MEQ PO TBCR
40.0000 meq | EXTENDED_RELEASE_TABLET | ORAL | Status: AC
  Administered 2023-04-21: 40 meq via ORAL
  Filled 2023-04-21: qty 2

## 2023-04-21 NOTE — TOC Progression Note (Signed)
 Transition of Care [facility]) - Progression Note    Patient Details  Name: Sheena [name] [facility] MRN: [id number] Date of Birth: Jun 08, 1945  Transition of Care [facility]) CM/SW Contact  Diona Browner, [place] Phone Number: 04/21/2023, 11:45 AM  Clinical Narrative:     CSW reviewed bed offers w/ pt over phone. Dtr chose [facility] notified. CSW started insurance auth; pending approval.   Expected Discharge Plan:  (TBD) Barriers to Discharge: Continued Medical Work up  Expected Discharge Plan and Services                                               Social Determinants of Health (SDOH) Interventions SDOH Screenings   Food Insecurity: No Food Insecurity (04/19/2023)  Housing: Low Risk  (04/19/2023)  Transportation Needs: No Transportation Needs (04/19/2023)  Utilities: Not At Risk (04/19/2023)  Depression (PHQ2-9): Low Risk  (03/17/2023)  Recent Concern: Depression (PHQ2-9) - Medium Risk (02/03/2023)  Financial Resource Strain: Low Risk  (03/17/2023)  Physical Activity: Inactive (03/17/2023)  Social Connections: Socially Isolated (04/19/2023)  Stress: No Stress Concern Present (03/17/2023)  Tobacco Use: Medium Risk (04/18/2023)  Health Literacy: Adequate Health Literacy (03/17/2023)    Readmission Risk Interventions    10/09/2022    2:31 PM  Readmission Risk Prevention Plan  Transportation Screening Complete  PCP or Specialist Appt within 3-5 Days Complete  HRI or Home Care Consult Complete  Social Work Consult for Recovery Care Planning/Counseling Complete  Palliative Care Screening Not Applicable  Medication Review Oceanographer) Complete

## 2023-04-21 NOTE — Progress Notes (Signed)
 PROGRESS NOTE    Hortence Charter [facility]  ZOX:[id number] DOB: [birth year] DOA: 04/18/2023 PCP: [name], MD  No chief complaint on file.   Brief Narrative:   [name] is Sheena [name] 78 y.o. female with PMH SCLC (dx 06/2022) s/p chemoradiation (declined prophylactic cranial irradiation). Also PMH PAF no AC, HTN, breast cancer. She had recent hospitalization from 03/31/2023 until 04/06/2023 due to right hip fracture and underwent surgical repair.  She was discharged to rehab and has had difficulty with recovery and worsening weakness/debility. She was brought back to the hospital due to respiratory symptoms and found to be hypoxic at [facility] requiring 10 L nonrebreather initially. Workup showed worsening left-sided pleural effusion and new right sided pleural effusion. She underwent left-sided thoracentesis removing 400 cc on 04/19/2023.  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)   Pleural effusion   Dysarthria   Physical deconditioning   Closed intertrochanteric fracture of hip, right, initial encounter (HCC) - s/p ORIF 04-02-2023 with IM nail   Anxiety   Essential hypertension, benign   Hyperlipidemia   PAF (paroxysmal atrial fibrillation) (HCC) - not on systemic anticoagulation due to hx of GI bleeding   Macrocytic anemia  Pneumonia - per CTA chest: "Inspissated material within the right middle and lower lobe bronchial tree with collapse of both lobes identified." -Cell count unimpressive on thoracentesis.  No serum LDH collected.  Appears fluid is likely transudate based on pleural fluid protein.  - for now will continue abx, de-escalate to unasyn - follow up SLP eval -> recommending liquids via straw - okay for CLD for now, will follow SLP recs   Dysarthria - Patient's son states her speech is usually very clear with no dysarthria or hesitations; this is clearly different than her  baseline; there is also very subtle left facial droop and she also has right upper extremity weakness compared to the left -Repeat MRI brain without intracranial metastatic disease   Pleural effusion - Seen previously on prior CT chest at least from November 2024; has now worsened - No prior thoracentesis has been performed; concern would be malignant effusion - Patient underwent thoracentesis on 04/19/2023, 400 cc removed - Follow-up thoracentesis cytology pending -- continue lasix as tolerated   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 Arbutus Ped  - awaiting comment from Dr. Arbutus Ped, will reach out 2/20 - patient already DNR; await further workup; if no considerable improvement then may need further GOC discussions with palliative care - MRI brain without intracranial metastatic disease - currently on surveillance  - follow up cytology from thoracentesis    Physical deconditioning - s/p IM nailing of right femur fx on 04/02/23 - Had not been thriving well in rehab.  With progressive deconditioning and weakness -Concern multifactorial with underlying malignancy along with advanced age with recent surgery and generalized malnourished state - PT/OT evals ordered - Low threshold for palliative care consult; oncology also consulted   Closed intertrochanteric fracture of hip, right, initial encounter [facility]) - s/p ORIF 04-02-2023 with IM nail - continue with PT/OT - had been at [facility] since discharge  - Okay for oxycodone for pain control along with Tylenol as needed - Morphine for breakthrough pain   Macrocytic anemia - low/normal B12 234 on 04/01/23 - continue supplementation    PAF (paroxysmal atrial fibrillation) (HCC) - not on systemic anticoagulation  due to hx of GI bleeding - Not on anticoagulation - Rate controlled; continue amiodarone - metoprolol on hold   Hyperlipidemia - Continue Lipitor although likely no further mortality  benefit at this time - discuss deprescribing with outpatient pcp - can consider during this hospitalization   Essential hypertension, benign -BP appropriate, not take amlodipine per med rec - metoprolol on hold   Anxiety - Currently on no controlled substances     DVT prophylaxis: lovenox Code Status: DNR Family Communication: none Disposition:   Status is: Inpatient Remains inpatient appropriate because: need for continued inpatient care   Consultants:  pulmonology  Procedures:  Echo IMPRESSIONS     1. Left ventricular ejection fraction, by estimation, is 65 to 70%. The  left ventricle has normal function. The left ventricle has no regional  wall motion abnormalities. Left ventricular diastolic parameters are  indeterminate. Elevated left atrial  pressure.   2. Right ventricular systolic function is hyperdynamic. The right  ventricular size is normal. Tricuspid regurgitation signal is inadequate  for assessing PA pressure.   3. The mitral valve is normal in structure. No evidence of mitral valve  regurgitation. No evidence of mitral stenosis.   4. The aortic valve is tricuspid. Aortic valve regurgitation is trivial.  No aortic stenosis is present.   5. The inferior vena cava is dilated in size with <50% respiratory  variability, suggesting right atrial pressure of 15 mmHg.   Comparison(s): Prior images reviewed side by side. IVC more dilated.   Antimicrobials:  Anti-infectives (From admission, onward)    Start     Dose/Rate Route Frequency Ordered Stop   04/20/23 1215  Ampicillin-Sulbactam (UNASYN) 3 g in sodium chloride 0.9 % 100 mL IVPB        3 g 200 mL/hr over 30 Minutes Intravenous Every 6 hours 04/20/23 1126     04/19/23 1000  piperacillin-tazobactam (ZOSYN) IVPB 3.375 g  Status:  Discontinued        3.375 g 12.5 mL/hr over 240 Minutes Intravenous Every 8 hours 04/19/23 0859 04/20/23 1125   04/19/23 0900  linezolid (ZYVOX) IVPB 600 mg  Status:  Discontinued         600 mg 300 mL/hr over 60 Minutes Intravenous Every 12 hours 04/19/23 0849 04/20/23 1125   04/19/23 0345  piperacillin-tazobactam (ZOSYN) IVPB 3.375 g        3.375 g 100 mL/hr over 30 Minutes Intravenous  Once 04/19/23 0330 04/19/23 0414   04/18/23 2200  cefTRIAXone (ROCEPHIN) 2 g in sodium chloride 0.9 % 100 mL IVPB        2 g 200 mL/hr over 30 Minutes Intravenous Once 04/18/23 2148 04/18/23 2234   04/18/23 2200  azithromycin (ZITHROMAX) 500 mg in sodium chloride 0.9 % 250 mL IVPB        500 mg 250 mL/hr over 60 Minutes Intravenous  Once 04/18/23 2148 04/19/23 0027       Subjective: Doesn't want anything other than jello and sweet tea  Objective: Vitals:   04/20/23 1946 04/20/23 2339 04/21/23 0429 04/21/23 1349  BP: (!) 97/58 (!) 107/48 (!) 109/56 (!) 111/45  Pulse: 80 70 84 74  Resp: 17 16 18 20   Temp: 98.1 F (36.7 C) 98.4 F (36.9 C) 98.4 F (36.9 C) 98.5 F (36.9 C)  TempSrc: Oral Oral Oral Oral  SpO2: 90% 98% 97% 98%  Weight:      Height:        Intake/Output Summary (Last 24 hours) at 04/21/2023 1759  Last data filed at 04/21/2023 4010 Gross per 24 hour  Intake 240 ml  Output 120 ml  Net 120 ml   Filed Weights   04/18/23 2126 04/19/23 1610  Weight: 39.5 kg 42.3 kg    Examination:  General exam: Appears calm and comfortable  Respiratory system: unlabored, diminished Cardiovascular system: RRR Gastrointestinal system: Abdomen is nondistended, soft and nontender Central nervous system: Alert and oriented. No focal neurological deficits. Extremities: no LEE    Data Reviewed: I have personally reviewed following labs and imaging studies  CBC: Recent Labs  Lab 04/18/23 2156 04/20/23 0428 04/21/23 0427  WBC 6.2 7.2 5.3  NEUTROABS 5.4  --  3.9  HGB 10.2* 8.0* 7.8*  HCT 32.5* 26.8* 26.4*  MCV 108.0* 110.7* 111.9*  PLT 188 155 153    Basic Metabolic Panel: Recent Labs  Lab 04/18/23 2156 04/20/23 0428 04/21/23 0427  NA 138 134* 136  K  4.8 3.9 3.4*  CL 98 97* 100  CO2 29 26 31   GLUCOSE 117* 99 92  BUN 22 17 13   CREATININE 0.90 0.77 0.77  CALCIUM 9.2 8.6* 8.1*  MG  --   --  1.8    GFR: Estimated Creatinine Clearance: 40.6 mL/min (by C-G formula based on SCr of 0.77 mg/dL).  Liver Function Tests: Recent Labs  Lab 04/18/23 2156  AST 35  ALT 23  ALKPHOS 111  BILITOT 0.7  PROT 7.0  ALBUMIN 3.5    CBG: No results for input(s): "GLUCAP" in the last 168 hours.   Recent Results (from the past 240 hours)  Blood Culture (routine x 2)     Status: None (Preliminary result)   Collection Time: 04/18/23  1:22 AM   Specimen: BLOOD  Result Value Ref Range Status   Specimen Description   Final    BLOOD LEFT ANTECUBITAL Performed at [facility], [address] [place], [place] [address]    Special Requests   Final    BOTTLES DRAWN AEROBIC AND ANAEROBIC Blood Culture results may not be optimal due to an inadequate volume of blood received in culture bottles Performed at [facility], [address], [place], [place] [address]    Culture   Final    NO GROWTH 2 DAYS Performed at [facility], [address], [place], [place] [address]    Report Status PENDING  Incomplete  Blood Culture (routine x 2)     Status: None (Preliminary result)   Collection Time: 04/18/23  9:46 PM   Specimen: BLOOD LEFT ARM  Result Value Ref Range Status   Specimen Description   Final    BLOOD LEFT ARM Performed at [facility], [address] [place], [place] [address]    Special Requests   Final    BOTTLES DRAWN AEROBIC AND ANAEROBIC Blood Culture adequate volume Performed at [facility], [address], [place], [place] [address]    Culture   Final    NO GROWTH 3 DAYS Performed at [facility], [address] [place], [place] [address]    Report Status PENDING  Incomplete  Resp panel by RT-PCR (RSV, Flu Yazmine Sorey&B, Covid) Anterior Nasal Swab     Status:  None   Collection Time: 04/18/23  9:48 PM   Specimen: Anterior Nasal Swab  Result Value Ref Range Status   SARS Coronavirus 2 by RT PCR NEGATIVE NEGATIVE Final    Comment: (NOTE) SARS-CoV-2 target nucleic acids are NOT  DETECTED.  The SARS-CoV-2 RNA is generally detectable in upper respiratory specimens during the acute phase of infection. The lowest concentration of SARS-CoV-2 viral copies this assay can detect is 138 copies/mL. Jaimin Krupka negative result does not preclude SARS-Cov-2 infection and should not be used as the sole basis for treatment or other patient management decisions. Kallan Bischoff negative result may occur with  improper specimen collection/handling, submission of specimen other than nasopharyngeal swab, presence of viral mutation(s) within the areas targeted by this assay, and inadequate number of viral copies(<138 copies/mL). Eloise Picone negative result must be combined with clinical observations, patient history, and epidemiological information. The expected result is Negative.  Fact Sheet for Patients:  BloggerCourse.com  Fact Sheet for Healthcare Providers:  SeriousBroker.it  This test is no t yet approved or cleared by the [place] FDA and  has been authorized for detection and/or diagnosis of SARS-CoV-2 by FDA under an Emergency Use Authorization (EUA). This EUA will remain  in effect (meaning this test can be used) for the duration of the COVID-19 declaration under Section 564(b)(1) of the Act, 21 U.S.C.section 360bbb-3(b)(1), unless the authorization is terminated  or revoked sooner.       Influenza Dabney Schanz by PCR NEGATIVE NEGATIVE Final   Influenza B by PCR NEGATIVE NEGATIVE Final    Comment: (NOTE) The Xpert Xpress SARS-CoV-2/FLU/RSV plus assay is intended as an aid in the diagnosis of influenza from Nasopharyngeal swab specimens and should not be used as Ziyad Dyar sole basis for treatment. Nasal washings and aspirates are unacceptable  for Xpert Xpress SARS-CoV-2/FLU/RSV testing.  Fact Sheet for Patients: BloggerCourse.com  Fact Sheet for Healthcare Providers: SeriousBroker.it  This test is not yet approved or cleared by the [place] FDA and has been authorized for detection and/or diagnosis of SARS-CoV-2 by FDA under an Emergency Use Authorization (EUA). This EUA will remain in effect (meaning this test can be used) for the duration of the COVID-19 declaration under Section 564(b)(1) of the Act, 21 U.S.C. section 360bbb-3(b)(1), unless the authorization is terminated or revoked.     Resp Syncytial Virus by PCR NEGATIVE NEGATIVE Final    Comment: (NOTE) Fact Sheet for Patients: BloggerCourse.com  Fact Sheet for Healthcare Providers: SeriousBroker.it  This test is not yet approved or cleared by the [place] FDA and has been authorized for detection and/or diagnosis of SARS-CoV-2 by FDA under an Emergency Use Authorization (EUA). This EUA will remain in effect (meaning this test can be used) for the duration of the COVID-19 declaration under Section 564(b)(1) of the Act, 21 U.S.C. section 360bbb-3(b)(1), unless the authorization is terminated or revoked.  Performed at [facility], [address], [place], [place] [address]   Body fluid culture w Gram Stain     Status: None (Preliminary result)   Collection Time: 04/19/23  3:29 PM   Specimen: PATH Cytology Pleural fluid  Result Value Ref Range Status   Specimen Description   Final    PLEURAL Performed at [facility], [address] [place], [place] [address]    Special Requests   Final    NONE Performed at [facility], [address], [place], [place] [address]    Gram Stain NO WBC SEEN NO ORGANISMS SEEN   Final   Culture   Final    NO GROWTH 2 DAYS Performed at [facility], [address], [place], [place] [address]    Report Status PENDING  Incomplete  MRSA Next Gen by PCR,  Nasal     Status: None   Collection Time: 04/19/23  4:50 PM   Specimen: Nasal Mucosa; Nasal Swab  Result Value Ref Range Status   MRSA by PCR Next Gen NOT DETECTED NOT DETECTED Final    Comment: (NOTE) The GeneXpert MRSA Assay (FDA approved for NASAL specimens only), is one component of Quincie Haroon comprehensive MRSA colonization surveillance program. It is not intended to diagnose MRSA infection nor to guide or monitor treatment for MRSA infections. Test performance is not FDA approved in patients less than 71 years old. Performed at [facility], [address], [place], [place] [address]          Radiology Studies: MR BRAIN W WO CONTRAST Result Date: 04/21/2023 CLINICAL DATA:  Small cell lung carcinoma EXAM: MRI HEAD WITHOUT AND WITH CONTRAST TECHNIQUE: Multiplanar, multiecho pulse sequences of the brain and surrounding structures were obtained without and with intravenous contrast. CONTRAST:  4mL GADAVIST GADOBUTROL 1 MMOL/ML IV SOLN COMPARISON:  None Available. FINDINGS: Brain: No acute infarct, mass effect or extra-axial collection. No acute or chronic hemorrhage. There is multifocal hyperintense T2-weighted signal within the white matter. Parenchymal volume and CSF spaces are normal. The midline structures are normal. There is no abnormal contrast enhancement. Vascular: Normal flow voids. Skull and upper cervical spine: Normal calvarium and skull base. Visualized upper cervical spine and soft tissues are normal. Sinuses/Orbits:No paranasal sinus fluid levels or advanced mucosal thickening. No mastoid or middle ear effusion. Normal orbits. IMPRESSION: 1. No intracranial metastatic disease. 2. Findings of chronic small vessel ischemia. Electronically Signed   By: Deatra [name] M.D.   On: 04/21/2023 00:19   ECHOCARDIOGRAM COMPLETE Result Date: 04/20/2023    ECHOCARDIOGRAM  REPORT   Patient Name:   Sheena [name] [facility] Date of Exam: 04/20/2023 Medical Rec #:  [id number]             Height:       60.0 in Accession #:    [id number]            Weight:       93.3 lb Date of Birth:  1945-03-21             BSA:          1.350 m Patient Age:    77 years              BP:           100/49 mmHg Patient Gender: F                     HR:           81 bpm. Exam Location:  Inpatient Procedure: 2D Echo, Cardiac Doppler and Color Doppler (Both Spectral and Color            Flow Doppler were utilized during procedure). Indications:    Other abnormalities of the heart R00.8  History:        Patient has prior history of Echocardiogram examinations, most                 recent 09/11/2022. Arrythmias:Atrial Fibrillation;                 Signs/Symptoms:Hypotension and Murmur.  Sonographer:    Webb Laws Referring Phys: Marron.Salles DAVID GIRGUIS IMPRESSIONS  1. Left ventricular ejection fraction, by estimation, is 65 to 70%. The left ventricle has normal function. The left ventricle has no regional wall motion abnormalities. Left ventricular diastolic  parameters are indeterminate. Elevated left atrial pressure.  2. Right ventricular systolic function is hyperdynamic. The right ventricular size is normal. Tricuspid regurgitation signal is inadequate for assessing PA pressure.  3. The mitral valve is normal in structure. No evidence of mitral valve regurgitation. No evidence of mitral stenosis.  4. The aortic valve is tricuspid. Aortic valve regurgitation is trivial. No aortic stenosis is present.  5. The inferior vena cava is dilated in size with <50% respiratory variability, suggesting right atrial pressure of 15 mmHg. Comparison(s): Prior images reviewed side by side. IVC more dilated. FINDINGS  Left Ventricle: Left ventricular ejection fraction, by estimation, is 65 to 70%. The left ventricle has normal function. The left ventricle has no regional wall motion abnormalities. Strain imaging was not performed.  The left ventricular internal cavity  size was normal in size. There is no left ventricular hypertrophy. Left ventricular diastolic parameters are indeterminate. Elevated left atrial pressure. Right Ventricle: The right ventricular size is normal. No increase in right ventricular wall thickness. Right ventricular systolic function is hyperdynamic. Tricuspid regurgitation signal is inadequate for assessing PA pressure. Left Atrium: Left atrial size was normal in size. Right Atrium: Right atrial size was not well visualized. Pericardium: There is no evidence of pericardial effusion. Mitral Valve: The mitral valve is normal in structure. No evidence of mitral valve regurgitation. No evidence of mitral valve stenosis. Tricuspid Valve: The tricuspid valve is normal in structure. Tricuspid valve regurgitation is not demonstrated. No evidence of tricuspid stenosis. Aortic Valve: The aortic valve is tricuspid. Aortic valve regurgitation is trivial. Aortic regurgitation PHT measures 314 msec. No aortic stenosis is present. Aortic valve peak gradient measures 15.9 mmHg. Pulmonic Valve: The pulmonic valve was not well visualized. Pulmonic valve regurgitation is not visualized. No evidence of pulmonic stenosis. Aorta: The aortic root and ascending aorta are structurally normal, with no evidence of dilitation. Venous: The inferior vena cava is dilated in size with less than 50% respiratory variability, suggesting right atrial pressure of 15 mmHg. IAS/Shunts: The atrial septum is grossly normal. Additional Comments: 3D imaging was not performed.  LEFT VENTRICLE PLAX 2D LVIDd:         2.90 cm   Diastology LVIDs:         2.50 cm   LV e' medial:    6.85 cm/s LV PW:         1.00 cm   LV E/e' medial:  17.8 LV IVS:        1.10 cm   LV e' lateral:   7.18 cm/s LVOT diam:     1.60 cm   LV E/e' lateral: 17.0 LV SV:         56 LV SV Index:   41 LVOT Area:     2.01 cm  RIGHT VENTRICLE             IVC RV Basal diam:  3.40 cm     IVC diam:  2.10 cm RV S prime:     10.10 cm/s TAPSE (M-mode): 2.5 cm LEFT ATRIUM             Index        RIGHT ATRIUM           Index LA diam:        3.40 cm 2.52 cm/m   RA Area:     10.40 cm LA Vol (A2C):   44.0 ml 32.59 ml/m  RA Volume:   23.10 ml  17.11 ml/m LA Vol (A4C):  40.0 ml 29.63 ml/m LA Biplane Vol: 43.0 ml 31.85 ml/m  AORTIC VALVE AV Area (Vmax): 1.21 cm AV Vmax:        199.50 cm/s AV Peak Grad:   15.9 mmHg LVOT Vmax:      120.00 cm/s LVOT Vmean:     84.300 cm/s LVOT VTI:       0.277 m AI PHT:         314 msec  AORTA Ao Root diam: 2.10 cm Ao Asc diam:  2.80 cm MITRAL VALVE MV Area (PHT): 4.21 cm     SHUNTS MV Decel Time: 180 msec     Systemic VTI:  0.28 m MV E velocity: 122.00 cm/s  Systemic Diam: 1.60 cm MV Joann Kulpa velocity: 132.00 cm/s MV E/Adriane Guglielmo ratio:  0.92 Riley Lam MD Electronically signed by Riley Lam MD Signature Date/Time: 04/20/2023/12:52:37 PM    Final         Scheduled Meds:  amiodarone  200 mg Oral Daily   aspirin EC  81 mg Oral Daily   atorvastatin  20 mg Oral QPM   vitamin B-12  1,000 mcg Oral Daily   docusate sodium  100 mg Oral BID   enoxaparin (LOVENOX) injection  30 mg Subcutaneous Q24H   lactose free nutrition  237 mL Oral BID BM   magnesium oxide  400 mg Oral Daily   Continuous Infusions:  ampicillin-sulbactam (UNASYN) IV 3 g (04/21/23 1303)     LOS: 2 days    Time spent: over 30 min    Lacretia Nicks, MD [facility]   To contact the attending provider between 7A-7P or the covering provider during after hours 7P-7A, please log into the web site [email or web address] and access using universal [place] password for that web site. If you do not have the password, please call the hospital operator.  04/21/2023, 5:59 PM

## 2023-04-22 DIAGNOSIS — A419 Sepsis, unspecified organism: Secondary | ICD-10-CM | POA: Diagnosis not present

## 2023-04-22 DIAGNOSIS — R6520 Severe sepsis without septic shock: Secondary | ICD-10-CM | POA: Diagnosis not present

## 2023-04-22 DIAGNOSIS — J9601 Acute respiratory failure with hypoxia: Secondary | ICD-10-CM | POA: Diagnosis not present

## 2023-04-22 LAB — CBC WITH DIFFERENTIAL/PLATELET
Abs Immature Granulocytes: 0.02 K/uL (ref 0.00–0.07)
Basophils Absolute: 0 K/uL (ref 0.0–0.1)
Basophils Relative: 1 %
Eosinophils Absolute: 0.1 K/uL (ref 0.0–0.5)
Eosinophils Relative: 1 %
HCT: 29.9 % — ABNORMAL LOW (ref 36.0–46.0)
Hemoglobin: 9.3 g/dL — ABNORMAL LOW (ref 12.0–15.0)
Immature Granulocytes: 0 %
Lymphocytes Relative: 10 %
Lymphs Abs: 0.6 K/uL — ABNORMAL LOW (ref 0.7–4.0)
MCH: 33.5 pg (ref 26.0–34.0)
MCHC: 31.1 g/dL (ref 30.0–36.0)
MCV: 107.6 fL — ABNORMAL HIGH (ref 80.0–100.0)
Monocytes Absolute: 0.8 K/uL (ref 0.1–1.0)
Monocytes Relative: 12 %
Neutro Abs: 4.7 K/uL (ref 1.7–7.7)
Neutrophils Relative %: 76 %
Platelets: 187 K/uL (ref 150–400)
RBC: 2.78 MIL/uL — ABNORMAL LOW (ref 3.87–5.11)
RDW: 14.7 % (ref 11.5–15.5)
WBC: 6.2 K/uL (ref 4.0–10.5)
nRBC: 0 % (ref 0.0–0.2)

## 2023-04-22 LAB — BASIC METABOLIC PANEL WITH GFR
Anion gap: 9 (ref 5–15)
BUN: 9 mg/dL (ref 8–23)
CO2: 32 mmol/L (ref 22–32)
Calcium: 8.3 mg/dL — ABNORMAL LOW (ref 8.9–10.3)
Chloride: 94 mmol/L — ABNORMAL LOW (ref 98–111)
Creatinine, Ser: 0.67 mg/dL (ref 0.44–1.00)
GFR, Estimated: >60 mL/min
Glucose, Bld: 94 mg/dL (ref 70–99)
Potassium: 3.8 mmol/L (ref 3.5–5.1)
Sodium: 135 mmol/L (ref 135–145)

## 2023-04-22 LAB — IRON AND TIBC
Iron: 39 ug/dL (ref 28–170)
Saturation Ratios: 19 % (ref 10.4–31.8)
TIBC: 209 ug/dL — ABNORMAL LOW (ref 250–450)
UIBC: 170 ug/dL

## 2023-04-22 LAB — VITAMIN B12: Vitamin B-12: 1844 pg/mL — ABNORMAL HIGH (ref 180–914)

## 2023-04-22 LAB — FOLATE: Folate: 14.1 ng/mL (ref >5.9)

## 2023-04-22 LAB — FERRITIN: Ferritin: 666 ng/mL — ABNORMAL HIGH (ref 11–307)

## 2023-04-22 LAB — MAGNESIUM: Magnesium: 1.8 mg/dL (ref 1.7–2.4)

## 2023-04-22 MED ORDER — ORAL CARE MOUTH RINSE: 15.0000 mL | OROMUCOSAL | Status: DC | PRN

## 2023-04-22 MED ORDER — CLOTRIMAZOLE 10 MG MT TROC
10.0000 mg | Freq: Every day | OROMUCOSAL | Status: AC
  Administered 2023-04-22 – 2023-04-29 (×28): 10 mg via ORAL
  Filled 2023-04-22 (×35): qty 1

## 2023-04-22 MED ORDER — FUROSEMIDE 10 MG/ML IJ SOLN
40.0000 mg | Freq: Once | INTRAMUSCULAR | Status: AC
  Administered 2023-04-22: 40 mg via INTRAVENOUS
  Filled 2023-04-22: qty 4

## 2023-04-22 NOTE — Progress Notes (Signed)
 PROGRESS NOTE    [facility]  ZOX:[id number] DOB: 06-14-1945 DOA: 04/18/2023 PCP: [name], MD  No chief complaint on file.   Brief Narrative:   [name] is Sheena [name] 78 y.o. female with PMH SCLC (dx 06/2022) s/p chemoradiation (declined prophylactic cranial irradiation). Also PMH PAF no AC, HTN, breast cancer. She had recent hospitalization from 03/31/2023 until 04/06/2023 due to right hip fracture and underwent surgical repair.  She was discharged to rehab and has had difficulty with recovery and worsening weakness/debility. She was brought back to the hospital due to respiratory symptoms and found to be hypoxic at [facility] requiring 10 L nonrebreather initially. Workup showed worsening left-sided pleural effusion and new right sided pleural effusion. She underwent left-sided thoracentesis removing 400 cc on 04/19/2023.  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)   Pleural effusion   Dysarthria   Physical deconditioning   Closed intertrochanteric fracture of hip, right, initial encounter (HCC) - s/p ORIF 04-02-2023 with IM nail   Anxiety   Essential hypertension, benign   Hyperlipidemia   PAF (paroxysmal atrial fibrillation) (HCC) - not on systemic anticoagulation due to hx of GI bleeding   Macrocytic anemia   Sepsis with acute hypoxic respiratory failure without septic shock (HCC)  Pneumonia - per CTA chest: "Inspissated material within the right middle and lower lobe bronchial tree with collapse of both lobes identified." -Cell count unimpressive on thoracentesis.  No serum LDH collected.  Appears fluid is likely transudate based on pleural fluid protein.  - for now will continue abx, de-escalate to unasyn - follow up SLP eval -> dysphagia 3, thin - okay for CLD for now, will follow SLP recs   Dysarthria - Patient's son states her speech is usually very clear with no  dysarthria or hesitations; this is clearly different than her baseline; there is also very subtle left facial droop and she also has right upper extremity weakness compared to the left -Repeat MRI brain without intracranial metastatic disease   Thrush - clotrimazole troche  Pleural effusion - Seen previously on prior CT chest at least from November 2024; has now worsened - No prior thoracentesis has been performed; concern would be malignant effusion - Patient underwent thoracentesis on 04/19/2023, 400 cc removed - Follow-up thoracentesis cytology pending -- continue lasix as tolerated   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 Arbutus Ped  - awaiting comment from Dr. Arbutus Ped, will reach out 2/20 - patient already DNR; await further workup; if no considerable improvement then may need further GOC discussions with palliative care - MRI brain without intracranial metastatic disease - currently on surveillance  - follow up cytology (pending) from thoracentesis    Physical deconditioning - s/p IM nailing of right femur fx on 04/02/23 - Had not been thriving well in rehab.  With progressive deconditioning and weakness -Concern multifactorial with underlying malignancy along with advanced age with recent surgery and generalized malnourished state - PT/OT evals ordered - Low threshold for palliative care consult; oncology also consulted   Closed intertrochanteric fracture of hip, right, initial encounter [facility]) - s/p ORIF 04-02-2023 with IM nail - continue with PT/OT - had been at [facility] since discharge  - Okay for oxycodone for pain control along with Tylenol as needed - Morphine for breakthrough pain   Macrocytic anemia - low/normal B12 234 on  04/01/23 - continue supplementation    PAF (paroxysmal atrial fibrillation) (HCC) - not on systemic anticoagulation due to hx of GI bleeding - Not on anticoagulation - Rate controlled; continue  amiodarone - metoprolol on hold   Hyperlipidemia - Continue Lipitor although likely no further mortality benefit at this time - discuss deprescribing with outpatient pcp - can consider during this hospitalization   Essential hypertension, benign -BP appropriate, not take amlodipine per med rec - metoprolol on hold   Anxiety - Currently on no controlled substances     DVT prophylaxis: lovenox Code Status: DNR Family Communication: none Disposition:   Status is: Inpatient Remains inpatient appropriate because: need for continued inpatient care   Consultants:  pulmonology  Procedures:  Echo IMPRESSIONS     1. Left ventricular ejection fraction, by estimation, is 65 to 70%. The  left ventricle has normal function. The left ventricle has no regional  wall motion abnormalities. Left ventricular diastolic parameters are  indeterminate. Elevated left atrial  pressure.   2. Right ventricular systolic function is hyperdynamic. The right  ventricular size is normal. Tricuspid regurgitation signal is inadequate  for assessing PA pressure.   3. The mitral valve is normal in structure. No evidence of mitral valve  regurgitation. No evidence of mitral stenosis.   4. The aortic valve is tricuspid. Aortic valve regurgitation is trivial.  No aortic stenosis is present.   5. The inferior vena cava is dilated in size with <50% respiratory  variability, suggesting right atrial pressure of 15 mmHg.   Comparison(s): Prior images reviewed side by side. IVC more dilated.   Antimicrobials:  Anti-infectives (From admission, onward)    Start     Dose/Rate Route Frequency Ordered Stop   04/20/23 1215  Ampicillin-Sulbactam (UNASYN) 3 g in sodium chloride 0.9 % 100 mL IVPB        3 g 200 mL/hr over 30 Minutes Intravenous Every 6 hours 04/20/23 1126     04/19/23 1000  piperacillin-tazobactam (ZOSYN) IVPB 3.375 g  Status:  Discontinued        3.375 g 12.5 mL/hr over 240 Minutes Intravenous  Every 8 hours 04/19/23 0859 04/20/23 1125   04/19/23 0900  linezolid (ZYVOX) IVPB 600 mg  Status:  Discontinued        600 mg 300 mL/hr over 60 Minutes Intravenous Every 12 hours 04/19/23 0849 04/20/23 1125   04/19/23 0345  piperacillin-tazobactam (ZOSYN) IVPB 3.375 g        3.375 g 100 mL/hr over 30 Minutes Intravenous  Once 04/19/23 0330 04/19/23 0414   04/18/23 2200  cefTRIAXone (ROCEPHIN) 2 g in sodium chloride 0.9 % 100 mL IVPB        2 g 200 mL/hr over 30 Minutes Intravenous Once 04/18/23 2148 04/18/23 2234   04/18/23 2200  azithromycin (ZITHROMAX) 500 mg in sodium chloride 0.9 % 250 mL IVPB        500 mg 250 mL/hr over 60 Minutes Intravenous  Once 04/18/23 2148 04/19/23 0027       Subjective: Asking for something more to eat  Objective: Vitals:   04/21/23 1953 04/22/23 0142 04/22/23 0500 04/22/23 1146  BP: (!) 133/58 (!) 153/69  (!) 126/52  Pulse: 83   79  Resp: 18 16  18   Temp: 98.6 F (37 C) 97.9 F (36.6 C)  (!) 97.4 F (36.3 C)  TempSrc: Oral Oral  Oral  SpO2: 97% 98%  98%  Weight:   44.2 kg   Height:  Intake/Output Summary (Last 24 hours) at 04/22/2023 1816 Last data filed at 04/22/2023 1300 Gross per 24 hour  Intake 120 ml  Output 1350 ml  Net -1230 ml   Filed Weights   04/18/23 2126 04/19/23 1610 04/22/23 0500  Weight: 39.5 kg 42.3 kg 44.2 kg    Examination:  General: frail, chronically ill appearing Cardiovascular: RRR. Lungs: unlabored Neurological: Alert and oriented 3. Moves all extremities 4 with equal strength. Cranial nerves II through XII grossly intact. Extremities: No clubbing or cyanosis. No edema.    Data Reviewed: I have personally reviewed following labs and imaging studies  CBC: Recent Labs  Lab 04/18/23 2156 04/20/23 0428 04/21/23 0427 04/22/23 0357  WBC 6.2 7.2 5.3 6.2  NEUTROABS 5.4  --  3.9 4.7  HGB 10.2* 8.0* 7.8* 9.3*  HCT 32.5* 26.8* 26.4* 29.9*  MCV 108.0* 110.7* 111.9* 107.6*  PLT 188 155 153 187     Basic Metabolic Panel: Recent Labs  Lab 04/18/23 2156 04/20/23 0428 04/21/23 0427 04/22/23 0357  NA 138 134* 136 135  K 4.8 3.9 3.4* 3.8  CL 98 97* 100 94*  CO2 29 26 31  32  GLUCOSE 117* 99 92 94  BUN 22 17 13 9   CREATININE 0.90 0.77 0.77 0.67  CALCIUM 9.2 8.6* 8.1* 8.3*  MG  --   --  1.8 1.8    GFR: Estimated Creatinine Clearance: 42.4 mL/min (by C-G formula based on SCr of 0.67 mg/dL).  Liver Function Tests: Recent Labs  Lab 04/18/23 2156  AST 35  ALT 23  ALKPHOS 111  BILITOT 0.7  PROT 7.0  ALBUMIN 3.5    CBG: No results for input(s): "GLUCAP" in the last 168 hours.   Recent Results (from the past 240 hours)  Blood Culture (routine x 2)     Status: None (Preliminary result)   Collection Time: 04/18/23  1:22 AM   Specimen: BLOOD  Result Value Ref Range Status   Specimen Description   Final    BLOOD LEFT ANTECUBITAL Performed at [facility], [address], [place], [place] [address]    Special Requests   Final    BOTTLES DRAWN AEROBIC AND ANAEROBIC Blood Culture results may not be optimal due to an inadequate volume of blood received in culture bottles Performed at [facility], [address], [place], [place] [address]    Culture   Final    NO GROWTH 3 DAYS Performed at [facility], [address] [place], [place] [address]    Report Status PENDING  Incomplete  Blood Culture (routine x 2)     Status: None (Preliminary result)   Collection Time: 04/18/23  9:46 PM   Specimen: BLOOD LEFT ARM  Result Value Ref Range Status   Specimen Description   Final    BLOOD LEFT ARM Performed at [facility], [address], [place], [place] [address]    Special Requests   Final    BOTTLES DRAWN AEROBIC AND ANAEROBIC Blood Culture adequate volume Performed at [facility], [address] [place], [place] [address]    Culture   Final    NO GROWTH 4 DAYS Performed at [facility], [address] [place], [place] [address]    Report Status PENDING  Incomplete  Resp panel by RT-PCR (RSV, Flu Kyleah Pensabene&B, Covid) Anterior Nasal Swab     Status: None   Collection Time: 04/18/23  9:48 PM   Specimen:  Anterior Nasal Swab  Result Value Ref Range Status   SARS Coronavirus 2 by RT PCR NEGATIVE NEGATIVE Final    Comment: (NOTE) SARS-CoV-2 target nucleic acids are NOT DETECTED.  The SARS-CoV-2 RNA is generally detectable in upper respiratory specimens during the acute phase of infection. The lowest concentration of SARS-CoV-2 viral copies this assay can detect is 138 copies/mL. Britiny Defrain negative result does not preclude SARS-Cov-2 infection and should not be used as the sole basis for treatment or other patient management decisions. Shaniah Baltes negative result may occur with  improper specimen collection/handling, submission of specimen other than nasopharyngeal swab, presence of viral mutation(s) within the areas targeted by this assay, and inadequate number of viral copies(<138 copies/mL). Nechama Escutia negative result must be combined with clinical observations, patient history, and epidemiological information. The expected result is Negative.  Fact Sheet for Patients:  BloggerCourse.com  Fact Sheet for Healthcare Providers:  SeriousBroker.it  This test is no t yet approved or cleared by the [place] FDA and  has been authorized for detection and/or diagnosis of SARS-CoV-2 by FDA under an Emergency Use Authorization (EUA). This EUA will remain  in effect (meaning this test can be used) for the duration of the COVID-19 declaration under Section 564(b)(1) of the Act, 21 U.S.C.section 360bbb-3(b)(1), unless the authorization is terminated  or revoked sooner.       Influenza Tabias Swayze by PCR NEGATIVE NEGATIVE Final   Influenza B by PCR NEGATIVE NEGATIVE Final    Comment: (NOTE) The Xpert Xpress SARS-CoV-2/FLU/RSV plus assay is intended as an  aid in the diagnosis of influenza from Nasopharyngeal swab specimens and should not be used as Caylie Sandquist sole basis for treatment. Nasal washings and aspirates are unacceptable for Xpert Xpress SARS-CoV-2/FLU/RSV testing.  Fact Sheet for Patients: BloggerCourse.com  Fact Sheet for Healthcare Providers: SeriousBroker.it  This test is not yet approved or cleared by the [place] FDA and has been authorized for detection and/or diagnosis of SARS-CoV-2 by FDA under an Emergency Use Authorization (EUA). This EUA will remain in effect (meaning this test can be used) for the duration of the COVID-19 declaration under Section 564(b)(1) of the Act, 21 U.S.C. section 360bbb-3(b)(1), unless the authorization is terminated or revoked.     Resp Syncytial Virus by PCR NEGATIVE NEGATIVE Final    Comment: (NOTE) Fact Sheet for Patients: BloggerCourse.com  Fact Sheet for Healthcare Providers: SeriousBroker.it  This test is not yet approved or cleared by the [place] FDA and has been authorized for detection and/or diagnosis of SARS-CoV-2 by FDA under an Emergency Use Authorization (EUA). This EUA will remain in effect (meaning this test can be used) for the duration of the COVID-19 declaration under Section 564(b)(1) of the Act, 21 U.S.C. section 360bbb-3(b)(1), unless the authorization is terminated or revoked.  Performed at [facility], [address], [place], [place] [address]   Body fluid culture w Gram Stain     Status: None (Preliminary result)   Collection Time: 04/19/23  3:29 PM   Specimen: PATH Cytology Pleural fluid  Result Value Ref Range Status   Specimen Description   Final    PLEURAL Performed at [facility], [address] [place], [place] [address]    Special Requests   Final    NONE Performed at [facility],  [address], [place], [place] [address]    Gram Stain NO WBC SEEN NO ORGANISMS SEEN   Final   Culture   Final  NO GROWTH 3 DAYS Performed at [facility], [address], [place], [place] [address]    Report Status PENDING  Incomplete  MRSA Next Gen by PCR, Nasal     Status: None   Collection Time: 04/19/23  4:50 PM   Specimen: Nasal Mucosa; Nasal Swab  Result Value Ref Range Status   MRSA by PCR Next Gen NOT DETECTED NOT DETECTED Final    Comment: (NOTE) The GeneXpert MRSA Assay (FDA approved for NASAL specimens only), is one component of Jaskarn Schweer comprehensive MRSA colonization surveillance program. It is not intended to diagnose MRSA infection nor to guide or monitor treatment for MRSA infections. Test performance is not FDA approved in patients less than 23 years old. Performed at [facility], [address] [place], [place] [address]          Radiology Studies: MR BRAIN W WO CONTRAST Result Date: 04/21/2023 CLINICAL DATA:  Small cell lung carcinoma EXAM: MRI HEAD WITHOUT AND WITH CONTRAST TECHNIQUE: Multiplanar, multiecho pulse sequences of the brain and surrounding structures were obtained without and with intravenous contrast. CONTRAST:  4mL GADAVIST GADOBUTROL 1 MMOL/ML IV SOLN COMPARISON:  None Available. FINDINGS: Brain: No acute infarct, mass effect or extra-axial collection. No acute or chronic hemorrhage. There is multifocal hyperintense T2-weighted signal within the white matter. Parenchymal volume and CSF spaces are normal. The midline structures are normal. There is no abnormal contrast enhancement. Vascular: Normal flow voids. Skull and upper cervical spine: Normal calvarium and skull base. Visualized upper cervical spine and soft tissues are normal. Sinuses/Orbits:No paranasal sinus fluid levels or advanced mucosal thickening. No mastoid or middle ear effusion. Normal orbits. IMPRESSION: 1. No intracranial metastatic disease. 2. Findings of  chronic small vessel ischemia. Electronically Signed   By: Deatra [name] M.D.   On: 04/21/2023 00:19        Scheduled Meds:  amiodarone  200 mg Oral Daily   aspirin EC  81 mg Oral Daily   atorvastatin  20 mg Oral QPM   vitamin B-12  1,000 mcg Oral Daily   docusate sodium  100 mg Oral BID   enoxaparin (LOVENOX) injection  30 mg Subcutaneous Q24H   lactose free nutrition  237 mL Oral BID BM   magnesium oxide  400 mg Oral Daily   Continuous Infusions:  ampicillin-sulbactam (UNASYN) IV 3 g (04/22/23 1809)     LOS: 3 days    Time spent: over 30 min    Lacretia Nicks, MD [facility]   To contact the attending provider between 7A-7P or the covering provider during after hours 7P-7A, please log into the web site [email or web address] and access using universal [place] password for that web site. If you do not have the password, please call the hospital operator.  04/22/2023, 6:16 PM

## 2023-04-22 NOTE — Plan of Care (Signed)
  Problem: Activity: Goal: Risk for activity intolerance will decrease Outcome: Not Progressing   Problem: Nutrition: Goal: Adequate nutrition will be maintained Outcome: Not Progressing   

## 2023-04-22 NOTE — Progress Notes (Signed)
 Sheena [name]   DOB:Dec 23, 1945   UJ#:[id number]      ASSESSMENT & PLAN:  1.  Acute on chronic hypoxic respiratory failure/pneumonia Left pleural effusion Shortness of breath - Admitted from [facility] 04/19/2023 with low O2 sats, cough, shortness of breath, lethargy, and chills.  Reports today that she still feels "bad".  - Continue IV antibiotics as ordered - Patient had chronic left-sided pleural effusion noted in November 2024 however this has now increased in size. - Thoracentesis per IR completed 04/18/2022 with 400 mL yellow fluid removed.  Cytology remains pending at this time. - Continue supportive care with O2 and diuretics as ordered   2.  Small cell lung cancer (T2a, N2, M0)  - Diagnosed April 2024. - Status post carboplatin and etoposide x 4 cycles, and with concurrent radiation therapy.  Patient declined prophylactic cranial irradiation. - Recent scan August 2024 showed no signs of disease progression and patient opted for watchful waiting approach.  Patient is scheduled for CT scan of chest, consider doing this during hospitalization. - Seen in outpatient oncology clinic on 02/03/2023.  Patient did note that she had been experiencing generalized weakness and imbalance.  MRI of brain showed small area of the skull that could potentially be bone mets. - Repeat MRI of head done 04/20/2023 negative for metastatic disease.   - Consider palliative consult - Follows with medical oncology/Dr. Arbutus Ped   3.  Failure to thrive - Likely due to malignancy and chronic disease - Supportive care   4.  Anemia, macrocytic - Hemoglobin stable 9.3 today.  Ferritin adequate. - Likely due to malignancy - Transfuse PRBC for hemoglobin <7.0.  No transfusion recommended at this time. - Continue to monitor CBC with differential    Code Status DNR-Limited  Subjective:  Patient seen awake and alert laying supine in bed.  States that she still feels "bad".  Complains of ongoing nausea and back  pain.  She is frail and ill-appearing.  Denies other acute complaints  Objective:  Vitals:   04/21/23 1953 04/22/23 0142  BP: (!) 133/58 (!) 153/69  Pulse: 83   Resp: 18 16  Temp: 98.6 F (37 C) 97.9 F (36.6 C)  SpO2: 97% 98%     Intake/Output Summary (Last 24 hours) at 04/22/2023 1111 Last data filed at 04/22/2023 1034 Gross per 24 hour  Intake 120 ml  Output 500 ml  Net -380 ml     REVIEW OF SYSTEMS:   Constitutional: +Fatigue, denies fevers, chills or abnormal night sweats Eyes: Denies blurriness of vision, double vision or watery eyes Ears, nose, mouth, throat, and face: Denies mucositis or sore throat Respiratory: +cough, +shortness of breath  Cardiovascular: Denies palpitation, chest discomfort or lower extremity swelling Gastrointestinal:  Denies nausea, heartburn or change in bowel habits Skin: Denies abnormal skin rashes Lymphatics: Denies new lymphadenopathy or easy bruising Neurological: Denies numbness, tingling or new weaknesses Behavioral/Psych: Mood is stable, no new changes  All other systems were reviewed with the patient and are negative.  PHYSICAL EXAMINATION: ECOG PERFORMANCE STATUS: 3 - Symptomatic, >50% confined to bed  Vitals:   04/21/23 1953 04/22/23 0142  BP: (!) 133/58 (!) 153/69  Pulse: 83   Resp: 18 16  Temp: 98.6 F (37 C) 97.9 F (36.6 C)  SpO2: 97% 98%   Filed Weights   04/18/23 2126 04/19/23 1610 04/22/23 0500  Weight: 87 lb (39.5 kg) 93 lb 4.8 oz (42.3 kg) 97 lb 7.1 oz (44.2 kg)    GENERAL: alert, + ill-appearing +  frail  SKIN: +Pale skin color, texture, turgor are normal, no rashes or significant lesions EYES: normal, conjunctiva are pink and non-injected, sclera clear OROPHARYNX: no exudate, no erythema and lips, buccal mucosa, and tongue normal  NECK: supple, thyroid normal size, non-tender, without nodularity LYMPH: no palpable lymphadenopathy in the cervical, axillary or inguinal LUNGS: clear to auscultation and  percussion with normal breathing effort HEART: regular rate & rhythm and no murmurs and no lower extremity edema ABDOMEN: abdomen soft, non-tender and normal bowel sounds MUSCULOSKELETAL: no cyanosis of digits and no clubbing  PSYCH: alert & oriented x 3 with fluent speech NEURO: no focal motor/sensory deficits   All questions were answered. The patient knows to call the clinic with any problems, questions or concerns.   The total time spent in the appointment was 40 minutes encounter with patient including review of chart and various tests results, discussions about plan of care and coordination of care plan  Dawson Bills, NP 04/22/2023 11:11 AM    Labs Reviewed:  Lab Results  Component Value Date   WBC 6.2 04/22/2023   HGB 9.3 (L) 04/22/2023   HCT 29.9 (L) 04/22/2023   MCV 107.6 (H) 04/22/2023   PLT 187 04/22/2023   Recent Labs    04/02/23 0558 04/02/23 1030 04/03/23 0515 04/18/23 2156 04/20/23 0428 04/21/23 0427 04/22/23 0357  NA 134*   < > 133* 138 134* 136 135  K 5.2*   < > 4.9 4.8 3.9 3.4* 3.8  CL 104   < > 101 98 97* 100 94*  CO2 21*  --  21* 29 26 31  32  GLUCOSE 108*   < > 140* 117* 99 92 94  BUN 29*   < > 23 22 17 13 9   CREATININE 1.02*   < > 0.93 0.90 0.77 0.77 0.67  CALCIUM 8.3*  --  8.6* 9.2 8.6* 8.1* 8.3*  GFRNONAA 57*  --  >60 >60 >60 >60 >60  PROT 5.8*  --  5.6* 7.0  --   --   --   ALBUMIN 3.1*  --  3.1* 3.5  --   --   --   AST 28  --  22 35  --   --   --   ALT 18  --  15 23  --   --   --   ALKPHOS 39  --  38 111  --   --   --   BILITOT 1.2  --  0.5 0.7  --   --   --    < > = values in this interval not displayed.    Studies Reviewed:  MR BRAIN W WO CONTRAST Result Date: 04/21/2023 CLINICAL DATA:  Small cell lung carcinoma EXAM: MRI HEAD WITHOUT AND WITH CONTRAST TECHNIQUE: Multiplanar, multiecho pulse sequences of the brain and surrounding structures were obtained without and with intravenous contrast. CONTRAST:  4mL GADAVIST GADOBUTROL 1 MMOL/ML  IV SOLN COMPARISON:  None Available. FINDINGS: Brain: No acute infarct, mass effect or extra-axial collection. No acute or chronic hemorrhage. There is multifocal hyperintense T2-weighted signal within the white matter. Parenchymal volume and CSF spaces are normal. The midline structures are normal. There is no abnormal contrast enhancement. Vascular: Normal flow voids. Skull and upper cervical spine: Normal calvarium and skull base. Visualized upper cervical spine and soft tissues are normal. Sinuses/Orbits:No paranasal sinus fluid levels or advanced mucosal thickening. No mastoid or middle ear effusion. Normal orbits. IMPRESSION: 1. No intracranial metastatic disease. 2. Findings  of chronic small vessel ischemia. Electronically Signed   By: Deatra [name] M.D.   On: 04/21/2023 00:19   ECHOCARDIOGRAM COMPLETE Result Date: 04/20/2023    ECHOCARDIOGRAM REPORT   Patient Name:   FELISA ZECHMAN [facility] Date of Exam: 04/20/2023 Medical Rec #:  [id number]             Height:       60.0 in Accession #:    [id number]            Weight:       93.3 lb Date of Birth:  06-Sep-1945             BSA:          1.350 m Patient Age:    77 years              BP:           100/49 mmHg Patient Gender: F                     HR:           81 bpm. Exam Location:  Inpatient Procedure: 2D Echo, Cardiac Doppler and Color Doppler (Both Spectral and Color            Flow Doppler were utilized during procedure). Indications:    Other abnormalities of the heart R00.8  History:        Patient has prior history of Echocardiogram examinations, most                 recent 09/11/2022. Arrythmias:Atrial Fibrillation;                 Signs/Symptoms:Hypotension and Murmur.  Sonographer:    Webb Laws Referring Phys: Marron.Salles DAVID GIRGUIS IMPRESSIONS  1. Left ventricular ejection fraction, by estimation, is 65 to 70%. The left ventricle has normal function. The left ventricle has no regional wall motion abnormalities. Left ventricular diastolic parameters  are indeterminate. Elevated left atrial pressure.  2. Right ventricular systolic function is hyperdynamic. The right ventricular size is normal. Tricuspid regurgitation signal is inadequate for assessing PA pressure.  3. The mitral valve is normal in structure. No evidence of mitral valve regurgitation. No evidence of mitral stenosis.  4. The aortic valve is tricuspid. Aortic valve regurgitation is trivial. No aortic stenosis is present.  5. The inferior vena cava is dilated in size with <50% respiratory variability, suggesting right atrial pressure of 15 mmHg. Comparison(s): Prior images reviewed side by side. IVC more dilated. FINDINGS  Left Ventricle: Left ventricular ejection fraction, by estimation, is 65 to 70%. The left ventricle has normal function. The left ventricle has no regional wall motion abnormalities. Strain imaging was not performed. The left ventricular internal cavity  size was normal in size. There is no left ventricular hypertrophy. Left ventricular diastolic parameters are indeterminate. Elevated left atrial pressure. Right Ventricle: The right ventricular size is normal. No increase in right ventricular wall thickness. Right ventricular systolic function is hyperdynamic. Tricuspid regurgitation signal is inadequate for assessing PA pressure. Left Atrium: Left atrial size was normal in size. Right Atrium: Right atrial size was not well visualized. Pericardium: There is no evidence of pericardial effusion. Mitral Valve: The mitral valve is normal in structure. No evidence of mitral valve regurgitation. No evidence of mitral valve stenosis. Tricuspid Valve: The tricuspid valve is normal in structure. Tricuspid valve regurgitation is not demonstrated. No evidence of tricuspid stenosis. Aortic Valve: The aortic valve is tricuspid.  Aortic valve regurgitation is trivial. Aortic regurgitation PHT measures 314 msec. No aortic stenosis is present. Aortic valve peak gradient measures 15.9 mmHg. Pulmonic  Valve: The pulmonic valve was not well visualized. Pulmonic valve regurgitation is not visualized. No evidence of pulmonic stenosis. Aorta: The aortic root and ascending aorta are structurally normal, with no evidence of dilitation. Venous: The inferior vena cava is dilated in size with less than 50% respiratory variability, suggesting right atrial pressure of 15 mmHg. IAS/Shunts: The atrial septum is grossly normal. Additional Comments: 3D imaging was not performed.  LEFT VENTRICLE PLAX 2D LVIDd:         2.90 cm   Diastology LVIDs:         2.50 cm   LV e' medial:    6.85 cm/s LV PW:         1.00 cm   LV E/e' medial:  17.8 LV IVS:        1.10 cm   LV e' lateral:   7.18 cm/s LVOT diam:     1.60 cm   LV E/e' lateral: 17.0 LV SV:         56 LV SV Index:   41 LVOT Area:     2.01 cm  RIGHT VENTRICLE             IVC RV Basal diam:  3.40 cm     IVC diam: 2.10 cm RV S prime:     10.10 cm/s TAPSE (M-mode): 2.5 cm LEFT ATRIUM             Index        RIGHT ATRIUM           Index LA diam:        3.40 cm 2.52 cm/m   RA Area:     10.40 cm LA Vol (A2C):   44.0 ml 32.59 ml/m  RA Volume:   23.10 ml  17.11 ml/m LA Vol (A4C):   40.0 ml 29.63 ml/m LA Biplane Vol: 43.0 ml 31.85 ml/m  AORTIC VALVE AV Area (Vmax): 1.21 cm AV Vmax:        199.50 cm/s AV Peak Grad:   15.9 mmHg LVOT Vmax:      120.00 cm/s LVOT Vmean:     84.300 cm/s LVOT VTI:       0.277 m AI PHT:         314 msec  AORTA Ao Root diam: 2.10 cm Ao Asc diam:  2.80 cm MITRAL VALVE MV Area (PHT): 4.21 cm     SHUNTS MV Decel Time: 180 msec     Systemic VTI:  0.28 m MV E velocity: 122.00 cm/s  Systemic Diam: 1.60 cm MV A velocity: 132.00 cm/s MV E/A ratio:  0.92 Riley Lam MD Electronically signed by Riley Lam MD Signature Date/Time: 04/20/2023/12:52:37 PM    Final    DG Chest 1 View Result Date: 04/19/2023 CLINICAL DATA:  Status post left-sided thoracentesis EXAM: CHEST  1 VIEW COMPARISON:  04/18/2023 FINDINGS: Right Port-A-Cath tip at superior  caval/atrial junction. Patient rotated left. Mild cardiomegaly. Atherosclerosis in the transverse aorta. Resolved left pleural effusion. No pneumothorax. There may be trace right pleural fluid. Mild interstitial prominence and indistinctness. Improved right base aeration with resolution of previously described right lower and right middle lobe collapse. Persistent left lower lobe airspace disease with volume loss. Lower thoracic vertebral augmentation. IMPRESSION: No pneumothorax after left-sided thoracentesis. Improved right and persistent left lower lung airspace disease. Cardiomegaly with mild interstitial  edema. Electronically Signed   By: Jeronimo Greaves M.D.   On: 04/19/2023 17:49   US THORACENTESIS ASP PLEURAL SPACE W/IMG GUIDE Result Date: 04/19/2023 INDICATION: 78 year old woman with pneumonia and pleural effusion. Diagnostic thoracentesis. EXAM: ULTRASOUND GUIDED LEFT THORACENTESIS MEDICATIONS: 10 mL 1% lidocaine COMPLICATIONS: None immediate. PROCEDURE: An ultrasound guided thoracentesis was thoroughly discussed with the patient and questions answered. The benefits, risks, alternatives and complications were also discussed. The patient understands and wishes to proceed with the procedure. Written consent was obtained. Ultrasound was performed to localize and mark an adequate pocket of fluid in the Left chest. The area was then prepped and draped in the normal sterile fashion. 1% Lidocaine was used for local anesthesia. Under ultrasound guidance a catheter was introduced. Thoracentesis was performed. The catheter was removed and a dressing applied. FINDINGS: A total of approximately 400 mL of yellow fluid was removed. Samples were sent to the laboratory as requested by the clinical team. IMPRESSION: Successful ultrasound guided left thoracentesis yielding 400 mL of pleural fluid. Performed By Theresa Mulligan, PA-C Electronically Signed   By: Corlis Leak M.D.   On: 04/19/2023 15:55   CT Angio Chest PE W/Cm  &/Or Wo Cm Result Date: 04/19/2023 CLINICAL DATA:  Increasing oxygen requirement EXAM: CT ANGIOGRAPHY CHEST WITH CONTRAST TECHNIQUE: Multidetector CT imaging of the chest was performed using the standard protocol during bolus administration of intravenous contrast. Multiplanar CT image reconstructions and MIPs were obtained to evaluate the vascular anatomy. RADIATION DOSE REDUCTION: This exam was performed according to the departmental dose-optimization program which includes automated exposure control, adjustment of the mA and/or kV according to patient size and/or use of iterative reconstruction technique. CONTRAST:  80mL OMNIPAQUE IOHEXOL 350 MG/ML SOLN COMPARISON:  Chest x-ray from earlier in the same day, CT from 01/30/2023 FINDINGS: Cardiovascular: Atherosclerotic calcifications of the thoracic aorta are noted without aneurysmal dilatation or dissection. Coronary calcifications are noted. No cardiac enlargement is seen. Right chest wall port is noted. Pulmonary artery shows a normal branching pattern bilaterally. No intraluminal filling defect to suggest pulmonary embolism is noted. Mediastinum/Nodes: Thoracic inlet is within normal limits. No hilar or mediastinal adenopathy is noted. The esophagus as visualized is within normal limits. Lungs/Pleura: Large left-sided pleural effusion is noted with mild left lower lobe consolidation. Increased in the interval from the prior exam. The right lung demonstrates inspissated material within the right lower lobe bronchial tree with right lower lobe collapse similar to that seen on prior plain film examination. Right middle lobe collapse is noted secondary to the inspissated material is well. This may represent a large mucous plug or possible aspirated material. Upper Abdomen: Visualized upper abdomen shows no acute abnormality. Musculoskeletal: No acute rib abnormality is noted. Multilevel chronic thoracic compression deformities are noted with evidence of vertebral  augmentation at T12 and L1. These changes are stable from the prior exam. Review of the MIP images confirms the above findings. IMPRESSION: No evidence of pulmonary emboli. Inspissated material within the right middle and lower lobe bronchial tree with collapse of both lobes identified. Left-sided pleural effusion with associated left lower lobe consolidation. Chronic compression deformities some with evidence of prior augmentation. Aortic Atherosclerosis (ICD10-I70.0). Electronically Signed   By: Alcide Clever M.D.   On: 04/19/2023 00:54   DG Chest Port 1 View Result Date: 04/18/2023 CLINICAL DATA:  Sepsis EXAM: PORTABLE CHEST 1 VIEW COMPARISON:  03/31/2023 FINDINGS: There is right middle and lower lobe collapse with elevation of the right hemidiaphragm and right-sided volume  loss. Right upper lobe is clear. Left lung appears hyperinflated, in keeping with changes of underlying COPD. Left pleural effusion is suspected. Right internal jugular chest port tip is seen within the superior vena cava. There is mediastinal shift to the right with obscuration of the cardiac silhouette. No acute bone abnormality. Multilevel vertebroplasty and numerous midthoracic compression deformities again noted. IMPRESSION: 1. Right middle and lower lobe collapse with elevation of the right hemidiaphragm and right-sided volume loss. Correlation for a central obstructing lesion, such as a mucous plug or aspirated foreign is recommended given its relatively rapid development. 2. COPD. 3. Suspected left pleural effusion. Electronically Signed   By: Helyn Numbers M.D.   On: 04/18/2023 22:28   DG Foot Complete Right Result Date: 04/06/2023 CLINICAL DATA:  RIGHT foot pain EXAM: RIGHT FOOT COMPLETE - 3+ VIEW COMPARISON:  None Available. FINDINGS: No fracture or dislocation of mid foot or forefoot. The phalanges are normal. The calcaneus is normal. No soft tissue abnormality. IMPRESSION: No fracture or dislocation. Electronically Signed    By: Genevive Bi M.D.   On: 04/06/2023 09:03   DG HIP PORT UNILAT W OR W/O PELVIS 1V RIGHT Result Date: 04/02/2023 CLINICAL DATA:  Status postoperative fixation of a right hip intertrochanteric fracture. EXAM: DG HIP (WITH OR WITHOUT PELVIS) 1V PORT RIGHT COMPARISON:  03/31/2023 and earlier today. FINDINGS: Interval intramedullary rod and compression screw fixation of the previously demonstrated comminuted intertrochanteric fracture on the right. Essentially anatomic position and alignment of the major fragments. Diffuse osteopenia. No new fractures. IMPRESSION: Status post ORIF of a right hip intertrochanteric fracture. Electronically Signed   By: Beckie Salts M.D.   On: 04/02/2023 19:31   DG HIP UNILAT WITH PELVIS 2-3 VIEWS RIGHT Result Date: 04/02/2023 CLINICAL DATA:  Elective surgery. Right hip intraoperative fluoroscopy. EXAM: DG HIP (WITH OR WITHOUT PELVIS) 2-3V RIGHT COMPARISON:  AP pelvis 03/31/2023 FINDINGS: Images were performed intraoperatively without the presence of a radiologist. The patient is undergoing cephalomedullary nail fixation of the previously seen intertrochanteric fracture of the proximal right femur. No hardware complication is seen. Improved alignment. Total fluoroscopy images: 6 Total fluoroscopy time: 65 seconds Total dose: Radiation Exposure Index (as provided by the fluoroscopic device): 4.35 mGy air Kerma Please see intraoperative findings for further detail. IMPRESSION: Intraoperative fluoroscopy for cephalomedullary nail fixation of the proximal right femur. Electronically Signed   By: Neita Garnet M.D.   On: 04/02/2023 15:43   DG C-Arm 1-60 Min-No Report Result Date: 04/02/2023 Fluoroscopy was utilized by the requesting physician.  No radiographic interpretation.   DG Knee Right Port Result Date: 03/31/2023 CLINICAL DATA:  Fall.  Hip fracture. EXAM: PORTABLE RIGHT KNEE - 1-2 VIEW COMPARISON:  None Available. FINDINGS: There is diffuse osteopenia of the visualized  osseous structures. No acute fracture or dislocation. No aggressive osseous lesion. No significant degenerative changes. No knee effusion or focal soft tissue swelling. No radiopaque foreign bodies. IMPRESSION: *No acute osseous abnormality of the right knee. Electronically Signed   By: Jules Schick M.D.   On: 03/31/2023 16:38   CT Head Wo Contrast Result Date: 03/31/2023 CLINICAL DATA:  Head trauma, moderate-severe; Neck trauma (Age >= 65y). Fall last night. Forehead laceration. EXAM: CT HEAD WITHOUT CONTRAST CT CERVICAL SPINE WITHOUT CONTRAST TECHNIQUE: Multidetector CT imaging of the head and cervical spine was performed following the standard protocol without intravenous contrast. Multiplanar CT image reconstructions of the cervical spine were also generated. RADIATION DOSE REDUCTION: This exam was performed according to the departmental  dose-optimization program which includes automated exposure control, adjustment of the mA and/or kV according to patient size and/or use of iterative reconstruction technique. COMPARISON:  Head MRI 01/30/2023 FINDINGS: CT HEAD FINDINGS Brain: There is no evidence of an acute infarct, intracranial hemorrhage, mass, midline shift, or extra-axial fluid collection. There is mild generalized cerebral atrophy. Cerebral white matter hypodensities are nonspecific but compatible with mild chronic small vessel ischemic disease. Vascular: Calcified atherosclerosis at the skull base. No hyperdense vessel. Skull: No acute fracture or destructive lesion. Sinuses/Orbits: The included paranasal sinuses and mastoid air cells are largely clear. Unremarkable orbits. Other: None. CT CERVICAL SPINE FINDINGS Alignment: Cervical spine straightening. Trace anterolisthesis of C7 on T1, likely degenerative. Skull base and vertebrae: No acute fracture or suspicious osseous lesion. Moderate atlantodental degenerative changes. Soft tissues and spinal canal: No prevertebral fluid or swelling. No visible  canal hematoma. Disc levels: Very mild disc degeneration for age. Up to moderate multilevel facet arthrosis. Upper chest: Emphysema. Other: Calcified atherosclerosis about the left greater than right carotid bifurcations. Partially visualized right internal jugular venous catheter. IMPRESSION: 1. No evidence of acute intracranial abnormality or cervical spine fracture. 2. Mild chronic small vessel ischemic disease. Electronically Signed   By: Sebastian Ache M.D.   On: 03/31/2023 14:34   CT Cervical Spine Wo Contrast Result Date: 03/31/2023 CLINICAL DATA:  Head trauma, moderate-severe; Neck trauma (Age >= 65y). Fall last night. Forehead laceration. EXAM: CT HEAD WITHOUT CONTRAST CT CERVICAL SPINE WITHOUT CONTRAST TECHNIQUE: Multidetector CT imaging of the head and cervical spine was performed following the standard protocol without intravenous contrast. Multiplanar CT image reconstructions of the cervical spine were also generated. RADIATION DOSE REDUCTION: This exam was performed according to the departmental dose-optimization program which includes automated exposure control, adjustment of the mA and/or kV according to patient size and/or use of iterative reconstruction technique. COMPARISON:  Head MRI 01/30/2023 FINDINGS: CT HEAD FINDINGS Brain: There is no evidence of an acute infarct, intracranial hemorrhage, mass, midline shift, or extra-axial fluid collection. There is mild generalized cerebral atrophy. Cerebral white matter hypodensities are nonspecific but compatible with mild chronic small vessel ischemic disease. Vascular: Calcified atherosclerosis at the skull base. No hyperdense vessel. Skull: No acute fracture or destructive lesion. Sinuses/Orbits: The included paranasal sinuses and mastoid air cells are largely clear. Unremarkable orbits. Other: None. CT CERVICAL SPINE FINDINGS Alignment: Cervical spine straightening. Trace anterolisthesis of C7 on T1, likely degenerative. Skull base and vertebrae: No  acute fracture or suspicious osseous lesion. Moderate atlantodental degenerative changes. Soft tissues and spinal canal: No prevertebral fluid or swelling. No visible canal hematoma. Disc levels: Very mild disc degeneration for age. Up to moderate multilevel facet arthrosis. Upper chest: Emphysema. Other: Calcified atherosclerosis about the left greater than right carotid bifurcations. Partially visualized right internal jugular venous catheter. IMPRESSION: 1. No evidence of acute intracranial abnormality or cervical spine fracture. 2. Mild chronic small vessel ischemic disease. Electronically Signed   By: Sebastian Ache M.D.   On: 03/31/2023 14:34   DG Pelvis Portable Result Date: 03/31/2023 CLINICAL DATA:  Fall, right hip pain EXAM: PORTABLE PELVIS 1-2 VIEWS COMPARISON:  12/09/2022 FINDINGS: Acute, comminuted intertrochanteric fracture of the proximal right femur with approximately 90 degrees of varus angulation. Hip joint intact without dislocation. Bony pelvis intact without fracture or diastasis. Soft tissue swelling is evident the lateral aspect of the right hip. IMPRESSION: Acute, comminuted intertrochanteric fracture of the proximal right femur with varus angulation. Electronically Signed   By: Duanne Guess D.O.  On: 03/31/2023 14:23   DG Chest Portable 1 View Result Date: 03/31/2023 CLINICAL DATA:  Fall and right hip fracture. EXAM: PORTABLE CHEST 1 VIEW COMPARISON:  Chest radiograph dated 12/20/2022. FINDINGS: Right-sided Port-A-Cath in similar position. The background of emphysema. No focal consolidation, pleural effusion, or pneumothorax. The cardiac silhouette is within normal limits. Atherosclerotic calcification of the aorta. Lower thoracic vertebroplasty. No acute osseous pathology. IMPRESSION: 1. No active disease. 2. Emphysema. Electronically Signed   By: Elgie Collard M.D.   On: 03/31/2023 14:22

## 2023-04-22 NOTE — Progress Notes (Signed)
 Speech Language Pathology Treatment: Dysphagia  Patient Details Name: Sheena Sheena [name] MRN: [id number] DOB: September 15, 1945 Today's Date: 04/22/2023 Time: 5784-6962 SLP Time Calculation (min) (ACUTE ONLY): 15 min  Assessment / Plan / Recommendation Clinical Impression  Patient seen per MD request to determine readiness for dietary advancement.  Patient does report that she had some issues with some nausea today but states Zofran managed it.  She endorses readiness to advance her diet.  SLP provided patient with chicken noodle soup, saltine crackers, and soda.  Adequate mastication with adequate oral clearance noted no delays in swallowing and no report of nausea or discomfort, nor any indication aspiration or airway compromise.  Patient consumed nearly 1/2 of the chicken noodle soup and several sips of soda, before saying she was finished.  Patient does report concern for self feeding stating she spills everything all over herself.  She does better using balls as she can hold that closer to her while she is feeding herself with her right hand.  Advised that she request staff provide her with bowls to ease feeding.    Provided her with menu and advised things that would be easier for her to manage to swallow.  She appears with trace white area on tongue surface (mid region) without discomfort however this was also present 2 days prior causing concern for potential beginning of oral candidiasis.  Patient states she brushed her teeth and her tongue this morning and denies therefore recommend monitor closely.  Note patient's MRI was negative for acute neurological changes which is fortunate given newer onset of facial asymmetry.   Discussed with patient options for dietary advancement to which she reported she wanted soft whole foods therefore order written.  Recommend advance as tolerated if MD agrees once managing her mechanical soft diet.  All education completed for dysphagia compensation and no  follow-up needed.   Thanks for this consult.    HPI HPI: Patient is a 78 y.o. female with PMH: HTN, paroxysmal a-fib, breast cancer, recent [name] stay at [name] in January of 2025 for hip fracture. She [name] with acute on chronic hypoxic respiratory failure due to healthcare acquired PNA. At baseline she is on 2L supplemental oxygen via [place]. She was found to be at 85% SpO2 at her facility and was placed on 10L NRB. CT angio/chest/PE showed left sided pleural effusion with associated left lower lobe consolidation, right lung demonstrates inspissated material within the right lower lobe bronchial tree with right lower lobe collapse with radiologist reporting it may represent a large mucous plug or possible aspirated material. SLP ordered for swallow evaluation.      SLP Plan  Continue with current plan of care      Recommendations for follow up therapy are one component of a multi-disciplinary discharge planning process, led by the attending physician.  Recommendations may be updated based on patient status, additional functional criteria and insurance authorization.    Recommendations  Diet recommendations: Thin liquid;Dysphagia 3 (mechanical soft) Liquids provided via: Straw Medication Administration: Other (Comment) Supervision: Patient able to self feed Compensations: Slow rate;Small sips/bites Postural Changes and/or Swallow Maneuvers: Seated upright 90 degrees;Upright 30-60 min after meal                  Oral care QID   None Dysphagia, unspecified (R13.10)     Continue with current plan of care   Sheena Infante, MS [name] SLP Acute Rehab Services Office [phone]   Sheena Sheena [name]  Sheena [name]  04/22/2023, 1:27 PM

## 2023-04-22 NOTE — Plan of Care (Signed)

## 2023-04-23 DIAGNOSIS — J90 Pleural effusion, not elsewhere classified: Secondary | ICD-10-CM | POA: Diagnosis not present

## 2023-04-23 LAB — BASIC METABOLIC PANEL
Anion gap: 11 (ref 5–15)
BUN: 8 mg/dL (ref 8–23)
CO2: 31 mmol/L (ref 22–32)
Calcium: 8.4 mg/dL — ABNORMAL LOW (ref 8.9–10.3)
Chloride: 93 mmol/L — ABNORMAL LOW (ref 98–111)
Creatinine, Ser: 0.69 mg/dL (ref 0.44–1.00)
GFR, Estimated: >60 mL/min (ref >60)
Glucose, Bld: 85 mg/dL (ref 70–99)
Potassium: 3.6 mmol/L (ref 3.5–5.1)
Sodium: 135 mmol/L (ref 135–145)

## 2023-04-23 LAB — CULTURE, BLOOD (ROUTINE X 2)
Culture: NO GROWTH
Special Requests: ADEQUATE

## 2023-04-23 LAB — CBC WITH DIFFERENTIAL/PLATELET
Abs Immature Granulocytes: 0.02 10*3/uL (ref 0.00–0.07)
Basophils Absolute: 0 10*3/uL (ref 0.0–0.1)
Basophils Relative: 1 %
Eosinophils Absolute: 0.1 10*3/uL (ref 0.0–0.5)
Eosinophils Relative: 1 %
HCT: 31.4 % — ABNORMAL LOW (ref 36.0–46.0)
Hemoglobin: 9.5 g/dL — ABNORMAL LOW (ref 12.0–15.0)
Immature Granulocytes: 0 %
Lymphocytes Relative: 12 %
Lymphs Abs: 0.6 10*3/uL — ABNORMAL LOW (ref 0.7–4.0)
MCH: 33.1 pg (ref 26.0–34.0)
MCHC: 30.3 g/dL (ref 30.0–36.0)
MCV: 109.4 fL — ABNORMAL HIGH (ref 80.0–100.0)
Monocytes Absolute: 0.7 10*3/uL (ref 0.1–1.0)
Monocytes Relative: 15 %
Neutro Abs: 3.3 10*3/uL (ref 1.7–7.7)
Neutrophils Relative %: 71 %
Platelets: 176 10*3/uL (ref 150–400)
RBC: 2.87 MIL/uL — ABNORMAL LOW (ref 3.87–5.11)
RDW: 14.7 % (ref 11.5–15.5)
WBC: 4.8 10*3/uL (ref 4.0–10.5)
nRBC: 0 % (ref 0.0–0.2)

## 2023-04-23 LAB — BODY FLUID CULTURE W GRAM STAIN: Gram Stain: NONE SEEN

## 2023-04-23 LAB — MAGNESIUM: Magnesium: 1.9 mg/dL (ref 1.7–2.4)

## 2023-04-23 LAB — BRAIN NATRIURETIC PEPTIDE: B Natriuretic Peptide: 188.2 pg/mL — ABNORMAL HIGH (ref 0.0–100.0)

## 2023-04-23 MED ORDER — FUROSEMIDE 10 MG/ML IJ SOLN
40.0000 mg | Freq: Once | INTRAMUSCULAR | Status: AC
  Administered 2023-04-23: 40 mg via INTRAVENOUS
  Filled 2023-04-23: qty 4

## 2023-04-23 NOTE — Plan of Care (Signed)

## 2023-04-23 NOTE — Progress Notes (Signed)
 PROGRESS NOTE    Sheena [name] [facility]  ZOX:[id number] DOB: [birth year] DOA: 04/18/2023 PCP: [name], MD  No chief complaint on file.   Brief Narrative:   Sheena [name] is Sheena [name] 78 y.o. female with PMH SCLC (dx 06/2022) s/p chemoradiation (declined prophylactic cranial irradiation). Also PMH PAF no AC, HTN, breast cancer. She had recent hospitalization from 03/31/2023 until 04/06/2023 due to right hip fracture and underwent surgical repair.  She was discharged to rehab and has had difficulty with recovery and worsening weakness/debility. She was brought back to the hospital due to respiratory symptoms and found to be hypoxic at [facility] requiring 10 L nonrebreather initially. Workup showed worsening left-sided pleural effusion and new right sided pleural effusion. She underwent left-sided thoracentesis removing 400 cc on 04/19/2023.  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)   Pleural effusion   Dysarthria   Physical deconditioning   Closed intertrochanteric fracture of hip, right, initial encounter (HCC) - s/p ORIF 04-02-2023 with IM nail   Anxiety   Essential hypertension, benign   Hyperlipidemia   PAF (paroxysmal atrial fibrillation) (HCC) - not on systemic anticoagulation due to hx of GI bleeding   Macrocytic anemia   Sepsis with acute hypoxic respiratory failure without septic shock (HCC)  Pneumonia - per CTA chest: "Inspissated material within the right middle and lower lobe bronchial tree with collapse of both lobes identified." -Cell count unimpressive on thoracentesis.  No serum LDH collected.  Appears fluid is likely transudate based on pleural fluid protein.  - for now will continue abx, de-escalate to unasyn (plan for 7 days abx for now) - follow up SLP eval -> dysphagia 3, thin   Dysarthria - Patient's son states her speech is usually very clear with no dysarthria or  hesitations; this is clearly different than her baseline; there is also very subtle left facial droop and she also has right upper extremity weakness compared to the left -Repeat MRI brain without intracranial metastatic disease   Thrush - clotrimazole troche  Pleural effusion - Seen previously on prior CT chest at least from November 2024; has now worsened - No prior thoracentesis has been performed; concern would be malignant effusion - Patient underwent thoracentesis on 04/19/2023, 400 cc removed - Follow-up thoracentesis cytology pending -- continue lasix as tolerated   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 Arbutus Ped  - awaiting comment from Dr. Arbutus Ped, will reach out 2/20 - patient already DNR; await further workup; if no considerable improvement then may need further GOC discussions with palliative care - MRI brain without intracranial metastatic disease - currently on surveillance  - follow up cytology (pending) from thoracentesis    Physical deconditioning - s/p IM nailing of right femur fx on 04/02/23 - Had not been thriving well in rehab.  With progressive deconditioning and weakness -Concern multifactorial with underlying malignancy along with advanced age with recent surgery and generalized malnourished state - PT/OT evals ordered - Low threshold for palliative care consult; oncology also consulted   Closed intertrochanteric fracture of hip, right, initial encounter [facility]) - s/p ORIF 04-02-2023 with IM nail - continue with PT/OT - had been at [facility] since discharge  - Okay for oxycodone for pain control along with Tylenol as needed - Morphine for breakthrough pain   Macrocytic anemia - low/normal B12 234 on 04/01/23 - continue  supplementation    PAF (paroxysmal atrial fibrillation) (HCC) - not on systemic anticoagulation due to hx of GI bleeding - Not on anticoagulation - Rate controlled; continue amiodarone -  metoprolol on hold   Hyperlipidemia - Continue Lipitor although likely no further mortality benefit at this time - discuss deprescribing with outpatient pcp - can consider during this hospitalization   Essential hypertension, benign -BP appropriate, not take amlodipine per med rec - metoprolol on hold   Anxiety - Currently on no controlled substances     DVT prophylaxis: lovenox Code Status: DNR Family Communication: none Disposition:   Status is: Inpatient Remains inpatient appropriate because: need for continued inpatient care   Consultants:  pulmonology  Procedures:  Echo IMPRESSIONS     1. Left ventricular ejection fraction, by estimation, is 65 to 70%. The  left ventricle has normal function. The left ventricle has no regional  wall motion abnormalities. Left ventricular diastolic parameters are  indeterminate. Elevated left atrial  pressure.   2. Right ventricular systolic function is hyperdynamic. The right  ventricular size is normal. Tricuspid regurgitation signal is inadequate  for assessing PA pressure.   3. The mitral valve is normal in structure. No evidence of mitral valve  regurgitation. No evidence of mitral stenosis.   4. The aortic valve is tricuspid. Aortic valve regurgitation is trivial.  No aortic stenosis is present.   5. The inferior vena cava is dilated in size with <50% respiratory  variability, suggesting right atrial pressure of 15 mmHg.   Comparison(s): Prior images reviewed side by side. IVC more dilated.   Antimicrobials:  Anti-infectives (From admission, onward)    Start     Dose/Rate Route Frequency Ordered Stop   04/20/23 1215  Ampicillin-Sulbactam (UNASYN) 3 g in sodium chloride 0.9 % 100 mL IVPB        3 g 200 mL/hr over 30 Minutes Intravenous Every 6 hours 04/20/23 1126     04/19/23 1000  piperacillin-tazobactam (ZOSYN) IVPB 3.375 g  Status:  Discontinued        3.375 g 12.5 mL/hr over 240 Minutes Intravenous Every 8 hours  04/19/23 0859 04/20/23 1125   04/19/23 0900  linezolid (ZYVOX) IVPB 600 mg  Status:  Discontinued        600 mg 300 mL/hr over 60 Minutes Intravenous Every 12 hours 04/19/23 0849 04/20/23 1125   04/19/23 0345  piperacillin-tazobactam (ZOSYN) IVPB 3.375 g        3.375 g 100 mL/hr over 30 Minutes Intravenous  Once 04/19/23 0330 04/19/23 0414   04/18/23 2200  cefTRIAXone (ROCEPHIN) 2 g in sodium chloride 0.9 % 100 mL IVPB        2 g 200 mL/hr over 30 Minutes Intravenous Once 04/18/23 2148 04/18/23 2234   04/18/23 2200  azithromycin (ZITHROMAX) 500 mg in sodium chloride 0.9 % 250 mL IVPB        500 mg 250 mL/hr over 60 Minutes Intravenous  Once 04/18/23 2148 04/19/23 0027       Subjective: Ate some eggs/sausage this AM  Objective: Vitals:   04/23/23 0408 04/23/23 0431 04/23/23 1129 04/23/23 1155  BP: (!) 146/56   (!) 111/49  Pulse: 80   88  Resp: 19   19  Temp: 98.5 F (36.9 C)   98.5 F (36.9 C)  TempSrc: Oral   Oral  SpO2: 99%  90% 94%  Weight:  42.6 kg    Height:        Intake/Output Summary (Last 24 hours)  at 04/23/2023 1355 Last data filed at 04/23/2023 0900 Gross per 24 hour  Intake 1390.97 ml  Output 500 ml  Net 890.97 ml   Filed Weights   04/19/23 1610 04/22/23 0500 04/23/23 0431  Weight: 42.3 kg 44.2 kg 42.6 kg    Examination:  General: No acute distress. frail Cardiovascular: RRR Lungs: unlabored Abdomen: Soft, nontender, nondistended. Neurological: Alert and oriented 3. Moves all extremities 4 with equal strength. Cranial nerves II through XII grossly intact. Extremities: No clubbing or cyanosis. No edema.   Data Reviewed: I have personally reviewed following labs and imaging studies  CBC: Recent Labs  Lab 04/18/23 2156 04/20/23 0428 04/21/23 0427 04/22/23 0357 04/23/23 0406  WBC 6.2 7.2 5.3 6.2 4.8  NEUTROABS 5.4  --  3.9 4.7 3.3  HGB 10.2* 8.0* 7.8* 9.3* 9.5*  HCT 32.5* 26.8* 26.4* 29.9* 31.4*  MCV 108.0* 110.7* 111.9* 107.6* 109.4*   PLT 188 155 153 187 176    Basic Metabolic Panel: Recent Labs  Lab 04/18/23 2156 04/20/23 0428 04/21/23 0427 04/22/23 0357 04/23/23 0406  NA 138 134* 136 135 135  K 4.8 3.9 3.4* 3.8 3.6  CL 98 97* 100 94* 93*  CO2 29 26 31  32 31  GLUCOSE 117* 99 92 94 85  BUN 22 17 13 9 8   CREATININE 0.90 0.77 0.77 0.67 0.69  CALCIUM 9.2 8.6* 8.1* 8.3* 8.4*  MG  --   --  1.8 1.8 1.9    GFR: Estimated Creatinine Clearance: 40.9 mL/min (by C-G formula based on SCr of 0.69 mg/dL).  Liver Function Tests: Recent Labs  Lab 04/18/23 2156  AST 35  ALT 23  ALKPHOS 111  BILITOT 0.7  PROT 7.0  ALBUMIN 3.5    CBG: No results for input(s): "GLUCAP" in the last 168 hours.   Recent Results (from the past 240 hours)  Blood Culture (routine x 2)     Status: None (Preliminary result)   Collection Time: 04/18/23  1:22 AM   Specimen: BLOOD  Result Value Ref Range Status   Specimen Description   Final    BLOOD LEFT ANTECUBITAL Performed at [facility], [address], [place], [place] [address]    Special Requests   Final    BOTTLES DRAWN AEROBIC AND ANAEROBIC Blood Culture results may not be optimal due to an inadequate volume of blood received in culture bottles Performed at [facility], [address] [place], [place] [address]    Culture   Final    NO GROWTH 4 DAYS Performed at [facility], [address] [place], [place] [address]    Report Status PENDING  Incomplete  Blood Culture (routine x 2)     Status: None   Collection Time: 04/18/23  9:46 PM   Specimen: BLOOD LEFT ARM  Result Value Ref Range Status   Specimen Description   Final    BLOOD LEFT ARM Performed at [facility], [address] [place], [place] [address]    Special Requests   Final    BOTTLES DRAWN AEROBIC AND ANAEROBIC Blood Culture adequate volume Performed at [facility], [address], [place], [place] [address]    Culture   Final     NO GROWTH 5 DAYS Performed at [facility], [address], [place], [place] [address]    Report Status 04/23/2023 FINAL  Final  Resp panel by RT-PCR (RSV, Flu Dallen Bunte&B, Covid) Anterior Nasal Swab  Status: None   Collection Time: 04/18/23  9:48 PM   Specimen: Anterior Nasal Swab  Result Value Ref Range Status   SARS Coronavirus 2 by RT PCR NEGATIVE NEGATIVE Final    Comment: (NOTE) SARS-CoV-2 target nucleic acids are NOT DETECTED.  The SARS-CoV-2 RNA is generally detectable in upper respiratory specimens during the acute phase of infection. The lowest concentration of SARS-CoV-2 viral copies this assay can detect is 138 copies/mL. Nixon Sparr negative result does not preclude SARS-Cov-2 infection and should not be used as the sole basis for treatment or other patient management decisions. Maia Handa negative result may occur with  improper specimen collection/handling, submission of specimen other than nasopharyngeal swab, presence of viral mutation(s) within the areas targeted by this assay, and inadequate number of viral copies(<138 copies/mL). Adelma Bowdoin negative result must be combined with clinical observations, patient history, and epidemiological information. The expected result is Negative.  Fact Sheet for Patients:  BloggerCourse.com  Fact Sheet for Healthcare Providers:  SeriousBroker.it  This test is no t yet approved or cleared by the [place] FDA and  has been authorized for detection and/or diagnosis of SARS-CoV-2 by FDA under an Emergency Use Authorization (EUA). This EUA will remain  in effect (meaning this test can be used) for the duration of the COVID-19 declaration under Section 564(b)(1) of the Act, 21 U.S.C.section 360bbb-3(b)(1), unless the authorization is terminated  or revoked sooner.       Influenza Artez Regis by PCR NEGATIVE NEGATIVE Final   Influenza B by PCR NEGATIVE NEGATIVE Final    Comment: (NOTE) The Xpert Xpress  SARS-CoV-2/FLU/RSV plus assay is intended as an aid in the diagnosis of influenza from Nasopharyngeal swab specimens and should not be used as Gerik Coberly sole basis for treatment. Nasal washings and aspirates are unacceptable for Xpert Xpress SARS-CoV-2/FLU/RSV testing.  Fact Sheet for Patients: BloggerCourse.com  Fact Sheet for Healthcare Providers: SeriousBroker.it  This test is not yet approved or cleared by the [place] FDA and has been authorized for detection and/or diagnosis of SARS-CoV-2 by FDA under an Emergency Use Authorization (EUA). This EUA will remain in effect (meaning this test can be used) for the duration of the COVID-19 declaration under Section 564(b)(1) of the Act, 21 U.S.C. section 360bbb-3(b)(1), unless the authorization is terminated or revoked.     Resp Syncytial Virus by PCR NEGATIVE NEGATIVE Final    Comment: (NOTE) Fact Sheet for Patients: BloggerCourse.com  Fact Sheet for Healthcare Providers: SeriousBroker.it  This test is not yet approved or cleared by the [place] FDA and has been authorized for detection and/or diagnosis of SARS-CoV-2 by FDA under an Emergency Use Authorization (EUA). This EUA will remain in effect (meaning this test can be used) for the duration of the COVID-19 declaration under Section 564(b)(1) of the Act, 21 U.S.C. section 360bbb-3(b)(1), unless the authorization is terminated or revoked.  Performed at [facility], [address] [place], [place] [address]   Body fluid culture w Gram Stain     Status: None   Collection Time: 04/19/23  3:29 PM   Specimen: PATH Cytology Pleural fluid  Result Value Ref Range Status   Specimen Description   Final    PLEURAL Performed at [facility], [address] [place], [place] [address]    Special Requests   Final    NONE Performed at [facility], [address] [place], [place] [address]    Gram Stain NO WBC SEEN NO ORGANISMS SEEN  Final   Culture   Final    NO GROWTH 3 DAYS Performed at [facility], [address], [place], [place] [address]    Report Status 04/23/2023 FINAL  Final  MRSA Next Gen by PCR, Nasal     Status: None   Collection Time: 04/19/23  4:50 PM   Specimen: Nasal Mucosa; Nasal Swab  Result Value Ref Range Status   MRSA by PCR Next Gen NOT DETECTED NOT DETECTED Final    Comment: (NOTE) The GeneXpert MRSA Assay (FDA approved for NASAL specimens only), is one component of Kemora Pinard comprehensive MRSA colonization surveillance program. It is not intended to diagnose MRSA infection nor to guide or monitor treatment for MRSA infections. Test performance is not FDA approved in patients less than 8 years old. Performed at [facility], [address], [place], [place] [address]          Radiology Studies: No results found.       Scheduled Meds:  amiodarone  200 mg Oral Daily   aspirin EC  81 mg Oral Daily   atorvastatin  20 mg Oral QPM   clotrimazole  10 mg Oral 5 X Daily   vitamin B-12  1,000 mcg Oral Daily   docusate sodium  100 mg Oral BID   enoxaparin (LOVENOX) injection  30 mg Subcutaneous Q24H   lactose free nutrition  237 mL Oral BID BM   magnesium oxide  400 mg Oral Daily   Continuous Infusions:  ampicillin-sulbactam (UNASYN) IV 3 g (04/23/23 1155)     LOS: 4 days    Time spent: over 30 min    Lacretia Nicks, MD [facility]   To contact the attending provider between 7A-7P or the covering provider during after hours 7P-7A, please log into the web site [email or web address] and access using universal [place] password for that web site. If you do not have the password, please call the hospital operator.  04/23/2023, 1:55 PM

## 2023-04-23 NOTE — Progress Notes (Signed)
 Physical Therapy Treatment Patient Details Name: Sheena [name] [facility] MRN: [id number] DOB: 27-Nov-1945 Today's Date: 04/23/2023   History of Present Illness Patient is a 78 yo female who was admitted on  04/18/2023 due to progressive SOB. Pt was found to have B pleural effusion, atelectasis and requiring 6 L/min supplemental O2 and underwent thoracentesis on 2/17 with 400 mL yellow fluid. Pt dx with acute on chronic hypoxic respiratory failure due to CPAP. Pt has PMH including but not limited to: HTN, anxiety, LLL small cell carcinoma, scarlet fever, PAF, GI bleed, chronic LBP, fall resulting in R femur fx s/p IM nail (04/02/2023), HLD, L1 compression fx and kyphoplasty.    PT Comments  Pt was agreeable to attempt to stand today. Pt was premedicated and her hip pain was minimal during the session. Pt completed LE hip exercises with min assist, bed mobility with mod assist and stood at the EOB with the RW mod assist. Pt is very fearful of falling and is limiting herself at times. Pt will need increased time for recovery and patience to develop a trust with her therapy team. Pt's son was present during the session. Recommend continued acute PT to maximize mobility and independence for next venue of care.    If plan is discharge home, recommend the following: Assistance with cooking/housework;Assist for transportation;Help with stairs or ramp for entrance;A lot of help with walking and/or transfers;A lot of help with bathing/dressing/bathroom   Can travel by private vehicle     No  Equipment Recommendations  None recommended by PT    Recommendations for Other Services       Precautions / Restrictions Precautions Precautions: Fall     Mobility  Bed Mobility Overal bed mobility: Needs Assistance Bed Mobility: Supine to Sit     Supine to sit: Mod assist, Used rails Sit to supine: Mod assist, Used rails   General bed mobility comments: increased time and cues for improved  independence Patient Response: Flat affect  Transfers Overall transfer level: Needs assistance Equipment used: Rolling walker (2 wheels) Transfers: Sit to/from Stand Sit to Stand: Mod assist, From elevated surface           General transfer comment: cues for hand placement    Ambulation/Gait             Pre-gait activities: stood with RW and worked on balance and side stepping to [facility]. Pt with increased anxiety. verbal and tactile cues. Pt was able to side step but fatigues quickly and is fearful. Feel pt could have taken more steps, but is limiting herself due to fear.     Stairs             Wheelchair Mobility     Tilt Bed Tilt Bed Patient Response: Flat affect  Modified Rankin (Stroke Patients Only)       Balance Overall balance assessment: Needs assistance Sitting-balance support: Single extremity supported Sitting balance-Leahy Scale: Fair     Standing balance support: Bilateral upper extremity supported Standing balance-Leahy Scale: Poor Standing balance comment: posterior lean mod assist to prevent falling backwards towards bead. was able to maintain balance with min assist during pre gait activities                            Communication Communication Communication: No apparent difficulties  Cognition Arousal: Alert Behavior During Therapy: Anxious   PT - Cognitive impairments: Memory, Sequencing, Safety/Judgement, Difficult to assess  Following commands: Impaired Following commands impaired: Follows one step commands with increased time    Cueing Cueing Techniques: Verbal cues, Tactile cues  Exercises Total Joint Exercises Ankle Circles/Pumps: AROM, Strengthening, Both, 10 reps, Supine Quad Sets: AROM, Strengthening, Right, 10 reps, Supine Heel Slides: AAROM, Strengthening, Right, 10 reps, Supine Hip ABduction/ADduction: AAROM, Strengthening, Right, 10 reps, Supine Long Arc Quad: AROM,  Strengthening, Both, Seated, 10 reps    General Comments General comments (skin integrity, edema, etc.): Pt son was present during treatment      Pertinent Vitals/Pain Pain Assessment Faces Pain Scale: Hurts a little bit Breathing: normal Negative Vocalization: none Facial Expression: smiling or inexpressive Body Language: tense, distressed pacing, fidgeting Consolability: distracted or reassured by voice/touch PAINAD Score: 2 Pain Location: hip with movement Pain Descriptors / Indicators: Discomfort, Grimacing, Guarding Pain Intervention(s): Limited activity within patient's tolerance, Premedicated before session, Monitored during session    Home Living                          Prior Function            PT Goals (current goals can now be found in the care plan section) Progress towards PT goals: Progressing toward goals    Frequency    Min 1X/week      PT Plan      Co-evaluation              AM-PAC PT "6 Clicks" Mobility   Outcome Measure  Help needed turning from your back to your side while in a flat bed without using bedrails?: A Lot Help needed moving from lying on your back to sitting on the side of a flat bed without using bedrails?: A Lot Help needed moving to and from a bed to a chair (including a wheelchair)?: A Lot Help needed standing up from a chair using your arms (e.g., wheelchair or bedside chair)?: A Lot Help needed to walk in hospital room?: A Lot Help needed climbing 3-5 steps with a railing? : Total 6 Click Score: 11    End of Session Equipment Utilized During Treatment: Gait belt Activity Tolerance: Patient tolerated treatment well Patient left: with call bell/phone within reach;in bed;with family/visitor present Nurse Communication: Mobility status PT Visit Diagnosis: History of falling (Z91.81);Difficulty in walking, not elsewhere classified (R26.2);Pain;Unsteadiness on feet (R26.81) Pain - Right/Left: Right Pain - part  of body: Leg     Time: 6295-2841 PT Time Calculation (min) (ACUTE ONLY): 35 min  Charges:    $Therapeutic Exercise: 8-22 mins $Therapeutic Activity: 8-22 mins PT General Charges $$ ACUTE PT VISIT: 1 Visit                        Greggory Stallion 04/23/2023, 11:41 AM

## 2023-04-24 ENCOUNTER — Inpatient Hospital Stay (HOSPITAL_COMMUNITY): Payer: 59

## 2023-04-24 DIAGNOSIS — Z85118 Personal history of other malignant neoplasm of bronchus and lung: Secondary | ICD-10-CM

## 2023-04-24 LAB — CBC WITH DIFFERENTIAL/PLATELET
Abs Immature Granulocytes: 0.02 10*3/uL (ref 0.00–0.07)
Basophils Absolute: 0 10*3/uL (ref 0.0–0.1)
Basophils Relative: 0 %
Eosinophils Absolute: 0.1 10*3/uL (ref 0.0–0.5)
Eosinophils Relative: 2 %
HCT: 30.1 % — ABNORMAL LOW (ref 36.0–46.0)
Hemoglobin: 9.2 g/dL — ABNORMAL LOW (ref 12.0–15.0)
Immature Granulocytes: 0 %
Lymphocytes Relative: 13 %
Lymphs Abs: 0.7 10*3/uL (ref 0.7–4.0)
MCH: 33.3 pg (ref 26.0–34.0)
MCHC: 30.6 g/dL (ref 30.0–36.0)
MCV: 109.1 fL — ABNORMAL HIGH (ref 80.0–100.0)
Monocytes Absolute: 0.9 10*3/uL (ref 0.1–1.0)
Monocytes Relative: 16 %
Neutro Abs: 3.8 10*3/uL (ref 1.7–7.7)
Neutrophils Relative %: 69 %
Platelets: 163 10*3/uL (ref 150–400)
RBC: 2.76 MIL/uL — ABNORMAL LOW (ref 3.87–5.11)
RDW: 14.8 % (ref 11.5–15.5)
WBC: 5.5 10*3/uL (ref 4.0–10.5)
nRBC: 0 % (ref 0.0–0.2)

## 2023-04-24 LAB — CULTURE, BLOOD (ROUTINE X 2): Culture: NO GROWTH

## 2023-04-24 LAB — BASIC METABOLIC PANEL
Anion gap: 10 (ref 5–15)
BUN: 7 mg/dL — ABNORMAL LOW (ref 8–23)
CO2: 34 mmol/L — ABNORMAL HIGH (ref 22–32)
Calcium: 8.2 mg/dL — ABNORMAL LOW (ref 8.9–10.3)
Chloride: 92 mmol/L — ABNORMAL LOW (ref 98–111)
Creatinine, Ser: 0.78 mg/dL (ref 0.44–1.00)
GFR, Estimated: >60 mL/min (ref >60)
Glucose, Bld: 101 mg/dL — ABNORMAL HIGH (ref 70–99)
Potassium: 3.7 mmol/L (ref 3.5–5.1)
Sodium: 136 mmol/L (ref 135–145)

## 2023-04-24 LAB — MAGNESIUM: Magnesium: 1.9 mg/dL (ref 1.7–2.4)

## 2023-04-24 MED ORDER — GUAIFENESIN-DM 100-10 MG/5ML PO SYRP
5.0000 mL | ORAL_SOLUTION | ORAL | Status: DC | PRN
  Administered 2023-04-24 – 2023-04-29 (×11): 5 mL via ORAL
  Filled 2023-04-24 (×11): qty 10

## 2023-04-24 NOTE — Progress Notes (Signed)
 PROGRESS NOTE    Sheena [name] [facility]  ONG:[id number] DOB: [birth year] DOA: 04/18/2023 PCP: [name], MD  No chief complaint on file.   Brief Narrative:   Sheena [name] is Sheena [name] 78 y.o. female with PMH SCLC (dx 06/2022) s/p chemoradiation (declined prophylactic cranial irradiation). Also PMH PAF no AC, HTN, breast cancer. She had recent hospitalization from 03/31/2023 until 04/06/2023 due to right hip fracture and underwent surgical repair.  She was discharged to rehab and has had difficulty with recovery and worsening weakness/debility. She was brought back to the hospital due to respiratory symptoms and found to be hypoxic at [facility] requiring 10 L nonrebreather initially. Workup showed worsening left-sided pleural effusion and new right sided pleural effusion. She underwent left-sided thoracentesis removing 400 cc on 04/19/2023.  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)   Pleural effusion   Dysarthria   Physical deconditioning   Closed intertrochanteric fracture of hip, right, initial encounter (HCC) - s/p ORIF 04-02-2023 with IM nail   Anxiety   Essential hypertension, benign   Hyperlipidemia   PAF (paroxysmal atrial fibrillation) (HCC) - not on systemic anticoagulation due to hx of GI bleeding   Macrocytic anemia   Sepsis with acute hypoxic respiratory failure without septic shock (HCC)  Pneumonia - per CTA chest: "Inspissated material within the right middle and lower lobe bronchial tree with collapse of both lobes identified." -Cell count unimpressive on thoracentesis.  No serum LDH collected.  Appears fluid is likely transudate based on pleural fluid protein.  - for now will continue abx, de-escalate to unasyn (plan for 7 days abx for now) - follow up SLP eval -> dysphagia 3, thin - will follow continued evals, she wants to advance her diet   Dysarthria - Patient's son  states her speech is usually very clear with no dysarthria or hesitations; this is clearly different than her baseline; there is also very subtle left facial droop and she also has right upper extremity weakness compared to the left -Repeat MRI brain without intracranial metastatic disease   Thrush - clotrimazole troche  Pleural effusion - Seen previously on prior CT chest at least from November 2024; has now worsened - No prior thoracentesis has been performed; concern would be malignant effusion - Patient underwent thoracentesis on 04/19/2023, 400 cc removed - Follow-up thoracentesis cytology pending -- continue lasix as tolerated   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 Arbutus Ped  - awaiting comment from Dr. Arbutus Ped, will reach out 2/20 - patient already DNR; await further workup; if no considerable improvement then may need further GOC discussions with palliative care - MRI brain without intracranial metastatic disease - currently on surveillance  - follow up cytology (pending) from thoracentesis    Physical deconditioning - s/p IM nailing of right femur fx on 04/02/23 - Had not been thriving well in rehab.  With progressive deconditioning and weakness -Concern multifactorial with underlying malignancy along with advanced age with recent surgery and generalized malnourished state - PT/OT evals ordered - Low threshold for palliative care consult; oncology also consulted   Closed intertrochanteric fracture of hip, right, initial encounter [facility]) - s/p ORIF 04-02-2023 with IM nail - continue with PT/OT - had been at [facility] since discharge  - Okay for oxycodone for pain control along with Tylenol as needed - Morphine for breakthrough pain -  follow imaging of L hip and knee   Macrocytic anemia - low/normal B12 234 on 04/01/23 - continue supplementation    PAF (paroxysmal atrial fibrillation) (HCC) - not on systemic anticoagulation due  to hx of GI bleeding - Not on anticoagulation - Rate controlled; continue amiodarone - metoprolol on hold   Hyperlipidemia - Continue Lipitor although likely no further mortality benefit at this time - discuss deprescribing with outpatient pcp - can consider during this hospitalization   Essential hypertension, benign -BP appropriate, not take amlodipine per med rec - metoprolol on hold   Anxiety - Currently on no controlled substances     DVT prophylaxis: lovenox Code Status: DNR Family Communication: none Disposition:   Status is: Inpatient Remains inpatient appropriate because: need for continued inpatient care   Consultants:  pulmonology  Procedures:  Echo IMPRESSIONS     1. Left ventricular ejection fraction, by estimation, is 65 to 70%. The  left ventricle has normal function. The left ventricle has no regional  wall motion abnormalities. Left ventricular diastolic parameters are  indeterminate. Elevated left atrial  pressure.   2. Right ventricular systolic function is hyperdynamic. The right  ventricular size is normal. Tricuspid regurgitation signal is inadequate  for assessing PA pressure.   3. The mitral valve is normal in structure. No evidence of mitral valve  regurgitation. No evidence of mitral stenosis.   4. The aortic valve is tricuspid. Aortic valve regurgitation is trivial.  No aortic stenosis is present.   5. The inferior vena cava is dilated in size with <50% respiratory  variability, suggesting right atrial pressure of 15 mmHg.   Comparison(s): Prior images reviewed side by side. IVC more dilated.   Antimicrobials:  Anti-infectives (From admission, onward)    Start     Dose/Rate Route Frequency Ordered Stop   04/20/23 1215  Ampicillin-Sulbactam (UNASYN) 3 g in sodium chloride 0.9 % 100 mL IVPB        3 g 200 mL/hr over 30 Minutes Intravenous Every 6 hours 04/20/23 1126 04/24/23 2359   04/19/23 1000  piperacillin-tazobactam (ZOSYN) IVPB  3.375 g  Status:  Discontinued        3.375 g 12.5 mL/hr over 240 Minutes Intravenous Every 8 hours 04/19/23 0859 04/20/23 1125   04/19/23 0900  linezolid (ZYVOX) IVPB 600 mg  Status:  Discontinued        600 mg 300 mL/hr over 60 Minutes Intravenous Every 12 hours 04/19/23 0849 04/20/23 1125   04/19/23 0345  piperacillin-tazobactam (ZOSYN) IVPB 3.375 g        3.375 g 100 mL/hr over 30 Minutes Intravenous  Once 04/19/23 0330 04/19/23 0414   04/18/23 2200  cefTRIAXone (ROCEPHIN) 2 g in sodium chloride 0.9 % 100 mL IVPB        2 g 200 mL/hr over 30 Minutes Intravenous Once 04/18/23 2148 04/18/23 2234   04/18/23 2200  azithromycin (ZITHROMAX) 500 mg in sodium chloride 0.9 % 250 mL IVPB        500 mg 250 mL/hr over 60 Minutes Intravenous  Once 04/18/23 2148 04/19/23 0027       Subjective: No new complaints  Objective: Vitals:   04/24/23 0430 04/24/23 0447 04/24/23 0900 04/24/23 1234  BP: (!) 142/53  (!) 132/58 (!) 93/51  Pulse: 81  74 75  Resp: 19   20  Temp: 98.4 F (36.9 C)  98.3 F (36.8 C) 97.8 F (36.6 C)  TempSrc: Oral  Oral Oral  SpO2: 95%  92% 91%  Weight:  42.2 kg    Height:        Intake/Output Summary (Last 24 hours) at 04/24/2023 1456 Last data filed at 04/24/2023 0438 Gross per 24 hour  Intake --  Output 1050 ml  Net -1050 ml   Filed Weights   04/22/23 0500 04/23/23 0431 04/24/23 0447  Weight: 44.2 kg 42.6 kg 42.2 kg    Examination:  General: No acute distress. Frail, looks to have more energy today. Cardiovascular: RRR Lungs: unlabored Neurological: Alert and oriented 3. Moves all extremities 4 with equal strength. Cranial nerves II through XII grossly intact. Extremities: No clubbing or cyanosis. No edema.   Data Reviewed: I have personally reviewed following labs and imaging studies  CBC: Recent Labs  Lab 04/18/23 2156 04/20/23 0428 04/21/23 0427 04/22/23 0357 04/23/23 0406 04/24/23 0434  WBC 6.2 7.2 5.3 6.2 4.8 5.5  NEUTROABS 5.4  --   3.9 4.7 3.3 3.8  HGB 10.2* 8.0* 7.8* 9.3* 9.5* 9.2*  HCT 32.5* 26.8* 26.4* 29.9* 31.4* 30.1*  MCV 108.0* 110.7* 111.9* 107.6* 109.4* 109.1*  PLT 188 155 153 187 176 163    Basic Metabolic Panel: Recent Labs  Lab 04/20/23 0428 04/21/23 0427 04/22/23 0357 04/23/23 0406 04/24/23 0434  NA 134* 136 135 135 136  K 3.9 3.4* 3.8 3.6 3.7  CL 97* 100 94* 93* 92*  CO2 26 31 32 31 34*  GLUCOSE 99 92 94 85 101*  BUN 17 13 9 8  7*  CREATININE 0.77 0.77 0.67 0.69 0.78  CALCIUM 8.6* 8.1* 8.3* 8.4* 8.2*  MG  --  1.8 1.8 1.9 1.9    GFR: Estimated Creatinine Clearance: 40.5 mL/min (by C-G formula based on SCr of 0.78 mg/dL).  Liver Function Tests: Recent Labs  Lab 04/18/23 2156  AST 35  ALT 23  ALKPHOS 111  BILITOT 0.7  PROT 7.0  ALBUMIN 3.5    CBG: No results for input(s): "GLUCAP" in the last 168 hours.   Recent Results (from the past 240 hours)  Blood Culture (routine x 2)     Status: None   Collection Time: 04/18/23  1:22 AM   Specimen: BLOOD  Result Value Ref Range Status   Specimen Description   Final    BLOOD LEFT ANTECUBITAL Performed at [facility], [address] [place], [place] [address]    Special Requests   Final    BOTTLES DRAWN AEROBIC AND ANAEROBIC Blood Culture results may not be optimal due to an inadequate volume of blood received in culture bottles Performed at [facility], [address], [place], [place] [address]    Culture   Final    NO GROWTH 5 DAYS Performed at [facility], [address], [place], [place] [address]    Report Status 04/24/2023 FINAL  Final  Blood Culture (routine x 2)     Status: None   Collection Time: 04/18/23  9:46 PM   Specimen: BLOOD LEFT ARM  Result Value Ref Range Status   Specimen Description   Final    BLOOD LEFT ARM Performed at [facility], [address] [place], [place] [address]    Special Requests   Final    BOTTLES DRAWN AEROBIC AND ANAEROBIC Blood  Culture adequate volume Performed at [facility], [address], [place], [place] [address]    Culture   Final    NO GROWTH 5 DAYS Performed at [facility], [address] [place], [place] [address]    Report Status 04/23/2023 FINAL  Final  Resp panel by RT-PCR (RSV, Flu Euel Castile&B, Covid) Anterior Nasal Swab     Status: None   Collection Time: 04/18/23  9:48 PM   Specimen: Anterior Nasal Swab  Result Value Ref Range Status   SARS Coronavirus 2 by RT PCR NEGATIVE NEGATIVE Final    Comment: (NOTE) SARS-CoV-2 target nucleic acids are NOT DETECTED.  The SARS-CoV-2 RNA is generally detectable in upper respiratory specimens during the acute phase of infection. The lowest concentration of SARS-CoV-2 viral copies this assay can detect is 138 copies/mL. Cheynne Virden negative result does not preclude SARS-Cov-2 infection and should not be used as the sole basis for treatment or other patient management decisions. Layne Lebon negative result may occur with  improper specimen collection/handling, submission of specimen other than nasopharyngeal swab, presence of viral mutation(s) within the areas targeted by this assay, and inadequate number of viral copies(<138 copies/mL). Jamayah Myszka negative result must be combined with clinical observations, patient history, and epidemiological information. The expected result is Negative.  Fact Sheet for Patients:  BloggerCourse.com  Fact Sheet for Healthcare Providers:  SeriousBroker.it  This test is no t yet approved or cleared by the [place] FDA and  has been authorized for detection and/or diagnosis of SARS-CoV-2 by FDA under an Emergency Use Authorization (EUA). This EUA will remain  in effect (meaning this test can be used) for the duration of the COVID-19 declaration under Section 564(b)(1) of the Act, 21 U.S.C.section 360bbb-3(b)(1), unless the authorization is terminated  or revoked sooner.        Influenza Victoriya Pol by PCR NEGATIVE NEGATIVE Final   Influenza B by PCR NEGATIVE NEGATIVE Final    Comment: (NOTE) The Xpert Xpress SARS-CoV-2/FLU/RSV plus assay is intended as an aid in the diagnosis of influenza from Nasopharyngeal swab specimens and should not be used as Anetra Czerwinski sole basis for treatment. Nasal washings and aspirates are unacceptable for Xpert Xpress SARS-CoV-2/FLU/RSV testing.  Fact Sheet for Patients: BloggerCourse.com  Fact Sheet for Healthcare Providers: SeriousBroker.it  This test is not yet approved or cleared by the [place] FDA and has been authorized for detection and/or diagnosis of SARS-CoV-2 by FDA under an Emergency Use Authorization (EUA). This EUA will remain in effect (meaning this test can be used) for the duration of the COVID-19 declaration under Section 564(b)(1) of the Act, 21 U.S.C. section 360bbb-3(b)(1), unless the authorization is terminated or revoked.     Resp Syncytial Virus by PCR NEGATIVE NEGATIVE Final    Comment: (NOTE) Fact Sheet for Patients: BloggerCourse.com  Fact Sheet for Healthcare Providers: SeriousBroker.it  This test is not yet approved or cleared by the [place] FDA and has been authorized for detection and/or diagnosis of SARS-CoV-2 by FDA under an Emergency Use Authorization (EUA). This EUA will remain in effect (meaning this test can be used) for the duration of the COVID-19 declaration under Section 564(b)(1) of the Act, 21 U.S.C. section 360bbb-3(b)(1), unless the authorization is terminated or revoked.  Performed at [facility], [address], [place], [place] [address]   Body fluid culture w Gram Stain     Status: None   Collection Time: 04/19/23  3:29 PM   Specimen: PATH Cytology Pleural fluid  Result Value Ref Range Status   Specimen Description   Final    PLEURAL Performed  at [facility], [address], [place], [place] [address]    Special Requests   Final  NONE Performed at [facility], [address], [place], [place] [address]    Gram Stain NO WBC SEEN NO ORGANISMS SEEN   Final   Culture   Final    NO GROWTH 3 DAYS Performed at [facility], [address] [place], [place] [address]    Report Status 04/23/2023 FINAL  Final  MRSA Next Gen by PCR, Nasal     Status: None   Collection Time: 04/19/23  4:50 PM   Specimen: Nasal Mucosa; Nasal Swab  Result Value Ref Range Status   MRSA by PCR Next Gen NOT DETECTED NOT DETECTED Final    Comment: (NOTE) The GeneXpert MRSA Assay (FDA approved for NASAL specimens only), is one component of Audris Speaker comprehensive MRSA colonization surveillance program. It is not intended to diagnose MRSA infection nor to guide or monitor treatment for MRSA infections. Test performance is not FDA approved in patients less than 1 years old. Performed at [facility], [address], [place], [place] [address]          Radiology Studies: No results found.       Scheduled Meds:  amiodarone  200 mg Oral Daily   aspirin EC  81 mg Oral Daily   atorvastatin  20 mg Oral QPM   clotrimazole  10 mg Oral 5 X Daily   vitamin B-12  1,000 mcg Oral Daily   docusate sodium  100 mg Oral BID   enoxaparin (LOVENOX) injection  30 mg Subcutaneous Q24H   lactose free nutrition  237 mL Oral BID BM   magnesium oxide  400 mg Oral Daily   Continuous Infusions:  ampicillin-sulbactam (UNASYN) IV 3 g (04/24/23 1239)     LOS: 5 days    Time spent: over 30 min    Lacretia Nicks, MD [facility]   To contact the attending provider between 7A-7P or the covering provider during after hours 7P-7A, please log into the web site [email or web address] and access using universal [employer] password for that web site. If you do not have the password, please call the  hospital operator.  04/24/2023, 2:56 PM

## 2023-04-24 NOTE — Plan of Care (Signed)
  Problem: Education: Goal: Knowledge of General Education information will improve Description: Including pain rating scale, medication(s)/side effects and non-pharmacologic comfort measures Outcome: Progressing   Problem: Clinical Measurements: Goal: Ability to maintain clinical measurements within normal limits will improve Outcome: Progressing Goal: Will remain free from infection Outcome: Progressing Goal: Diagnostic test results will improve Outcome: Progressing Goal: Respiratory complications will improve Outcome: Progressing   Problem: Coping: Goal: Level of anxiety will decrease Outcome: Progressing   Problem: Elimination: Goal: Will not experience complications related to bowel motility Outcome: Progressing Goal: Will not experience complications related to urinary retention Outcome: Progressing   Problem: Pain Managment: Goal: General experience of comfort will improve and/or be controlled Outcome: Progressing   Problem: Safety: Goal: Ability to remain free from injury will improve Outcome: Progressing   Problem: Skin Integrity: Goal: Risk for impaired skin integrity will decrease Outcome: Progressing   Problem: Health Behavior/Discharge Planning: Goal: Ability to manage health-related needs will improve Outcome: Not Progressing   Problem: Activity: Goal: Risk for activity intolerance will decrease Outcome: Not Progressing   Problem: Nutrition: Goal: Adequate nutrition will be maintained Outcome: Not Progressing

## 2023-04-25 ENCOUNTER — Inpatient Hospital Stay (HOSPITAL_COMMUNITY): Payer: 59

## 2023-04-25 DIAGNOSIS — J189 Pneumonia, unspecified organism: Secondary | ICD-10-CM | POA: Diagnosis not present

## 2023-04-25 LAB — CBC WITH DIFFERENTIAL/PLATELET
Abs Immature Granulocytes: 0.04 10*3/uL (ref 0.00–0.07)
Basophils Absolute: 0 10*3/uL (ref 0.0–0.1)
Basophils Relative: 1 %
Eosinophils Absolute: 0.1 10*3/uL (ref 0.0–0.5)
Eosinophils Relative: 1 %
HCT: 29.1 % — ABNORMAL LOW (ref 36.0–46.0)
Hemoglobin: 8.8 g/dL — ABNORMAL LOW (ref 12.0–15.0)
Immature Granulocytes: 1 %
Lymphocytes Relative: 10 %
Lymphs Abs: 0.6 10*3/uL — ABNORMAL LOW (ref 0.7–4.0)
MCH: 33 pg (ref 26.0–34.0)
MCHC: 30.2 g/dL (ref 30.0–36.0)
MCV: 109 fL — ABNORMAL HIGH (ref 80.0–100.0)
Monocytes Absolute: 0.9 10*3/uL (ref 0.1–1.0)
Monocytes Relative: 15 %
Neutro Abs: 4.2 10*3/uL (ref 1.7–7.7)
Neutrophils Relative %: 72 %
Platelets: 135 10*3/uL — ABNORMAL LOW (ref 150–400)
RBC: 2.67 MIL/uL — ABNORMAL LOW (ref 3.87–5.11)
RDW: 14.9 % (ref 11.5–15.5)
WBC: 5.7 10*3/uL (ref 4.0–10.5)
nRBC: 0 % (ref 0.0–0.2)

## 2023-04-25 LAB — BASIC METABOLIC PANEL
Anion gap: 9 (ref 5–15)
BUN: 6 mg/dL — ABNORMAL LOW (ref 8–23)
CO2: 32 mmol/L (ref 22–32)
Calcium: 8.2 mg/dL — ABNORMAL LOW (ref 8.9–10.3)
Chloride: 95 mmol/L — ABNORMAL LOW (ref 98–111)
Creatinine, Ser: 0.9 mg/dL (ref 0.44–1.00)
GFR, Estimated: >60 mL/min (ref >60)
Glucose, Bld: 93 mg/dL (ref 70–99)
Potassium: 3.3 mmol/L — ABNORMAL LOW (ref 3.5–5.1)
Sodium: 136 mmol/L (ref 135–145)

## 2023-04-25 LAB — MAGNESIUM: Magnesium: 2 mg/dL (ref 1.7–2.4)

## 2023-04-25 MED ORDER — FUROSEMIDE 10 MG/ML IJ SOLN
40.0000 mg | Freq: Every day | INTRAMUSCULAR | Status: AC
  Administered 2023-04-26: 40 mg via INTRAVENOUS
  Filled 2023-04-25: qty 4

## 2023-04-25 MED ORDER — IPRATROPIUM-ALBUTEROL 0.5-2.5 (3) MG/3ML IN SOLN
3.0000 mL | Freq: Two times a day (BID) | RESPIRATORY_TRACT | Status: DC
  Administered 2023-04-25 – 2023-04-26 (×3): 3 mL via RESPIRATORY_TRACT
  Filled 2023-04-25 (×3): qty 3

## 2023-04-25 MED ORDER — FUROSEMIDE 10 MG/ML IJ SOLN
40.0000 mg | Freq: Once | INTRAMUSCULAR | Status: AC
  Administered 2023-04-25: 40 mg via INTRAVENOUS
  Filled 2023-04-25: qty 4

## 2023-04-25 MED ORDER — POTASSIUM CHLORIDE CRYS ER 20 MEQ PO TBCR
40.0000 meq | EXTENDED_RELEASE_TABLET | ORAL | Status: AC
  Administered 2023-04-25 (×2): 40 meq via ORAL
  Filled 2023-04-25 (×2): qty 2

## 2023-04-25 NOTE — Progress Notes (Signed)
 PROGRESS NOTE    Sheena [name] [facility]  QIO:[id number] DOB: [birth year] DOA: 04/18/2023 PCP: [name], MD  No chief complaint on file.   Brief Narrative:   [name] is Sheena [name] 78 y.o. female with PMH SCLC (dx 06/2022) s/p chemoradiation (declined prophylactic cranial irradiation). Also PMH PAF no AC, HTN, breast cancer. She had recent hospitalization from 03/31/2023 until 04/06/2023 due to right hip fracture and underwent surgical repair.  She was discharged to rehab and has had difficulty with recovery and worsening weakness/debility. She was brought back to the hospital due to respiratory symptoms and found to be hypoxic at [facility] requiring 10 L nonrebreather initially. Workup showed worsening left-sided pleural effusion and new right sided pleural effusion. She underwent left-sided thoracentesis removing 400 cc on 04/19/2023.  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)   Pleural effusion   Dysarthria   Physical deconditioning   Closed intertrochanteric fracture of hip, right, initial encounter (HCC) - s/p ORIF 04-02-2023 with IM nail   Anxiety   Essential hypertension, benign   Hyperlipidemia   PAF (paroxysmal atrial fibrillation) (HCC) - not on systemic anticoagulation due to hx of GI bleeding   Macrocytic anemia   Sepsis with acute hypoxic respiratory failure without septic shock (HCC)  Pneumonia - per CTA chest: "Inspissated material within the right middle and lower lobe bronchial tree with collapse of both lobes identified." -Cell count unimpressive on thoracentesis.  No serum LDH collected.  Appears fluid is likely transudate based on pleural fluid protein.  - for now will continue abx, de-escalate to unasyn (plan for 7 days abx for now) - follow up SLP eval -> dysphagia 3, thin - will follow continued evals, she wants to advance her diet   Dysarthria - Patient's son  states her speech is usually very clear with no dysarthria or hesitations; this is clearly different than her baseline; there is also very subtle left facial droop and she also has right upper extremity weakness compared to the left -Repeat MRI brain without intracranial metastatic disease   Thrush - clotrimazole troche  Pleural effusion - Seen previously on prior CT chest at least from November 2024; has now worsened - No prior thoracentesis has been performed; concern would be malignant effusion - Patient underwent thoracentesis on 04/19/2023, 400 cc removed - Follow-up thoracentesis cytology pending -- continue lasix as tolerated -- 2 view CXR pending   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 Arbutus Ped  - awaiting comment from Dr. Arbutus Ped, will reach out 2/20 - patient already DNR; await further workup; if no considerable improvement then may need further GOC discussions with palliative care - MRI brain without intracranial metastatic disease - currently on surveillance  - follow up cytology (pending) from thoracentesis    Physical deconditioning - s/p IM nailing of right femur fx on 04/02/23 - Had not been thriving well in rehab.  With progressive deconditioning and weakness -Concern multifactorial with underlying malignancy along with advanced age with recent surgery and generalized malnourished state - PT/OT evals ordered - Low threshold for palliative care consult; oncology also consulted   Closed intertrochanteric fracture of hip, right, initial encounter [facility]) - s/p ORIF 04-02-2023 with IM nail - continue with PT/OT - had been at [facility] since discharge  - Okay for oxycodone for pain control along with Tylenol as needed -  Morphine for breakthrough pain - follow imaging of L hip (unchanged alignment of proximal femoral intertrochanteric fracture status post cephalomedullary nail fixation) and knee (tiny effusion)   Macrocytic  anemia - low/normal B12 234 on 04/01/23 - continue supplementation    PAF (paroxysmal atrial fibrillation) (HCC) - not on systemic anticoagulation due to hx of GI bleeding - Not on anticoagulation - Rate controlled; continue amiodarone - metoprolol on hold   Hyperlipidemia - Continue Lipitor although likely no further mortality benefit at this time - discuss deprescribing with outpatient pcp - can consider during this hospitalization   Essential hypertension, benign -BP appropriate, not take amlodipine per med rec - metoprolol on hold   Anxiety - Currently on no controlled substances     DVT prophylaxis: lovenox Code Status: DNR Family Communication: none Disposition:   Status is: Inpatient Remains inpatient appropriate because: need for continued inpatient care   Consultants:  pulmonology  Procedures:  Echo IMPRESSIONS     1. Left ventricular ejection fraction, by estimation, is 65 to 70%. The  left ventricle has normal function. The left ventricle has no regional  wall motion abnormalities. Left ventricular diastolic parameters are  indeterminate. Elevated left atrial  pressure.   2. Right ventricular systolic function is hyperdynamic. The right  ventricular size is normal. Tricuspid regurgitation signal is inadequate  for assessing PA pressure.   3. The mitral valve is normal in structure. No evidence of mitral valve  regurgitation. No evidence of mitral stenosis.   4. The aortic valve is tricuspid. Aortic valve regurgitation is trivial.  No aortic stenosis is present.   5. The inferior vena cava is dilated in size with <50% respiratory  variability, suggesting right atrial pressure of 15 mmHg.   Comparison(s): Prior images reviewed side by side. IVC more dilated.   Antimicrobials:  Anti-infectives (From admission, onward)    Start     Dose/Rate Route Frequency Ordered Stop   04/20/23 1215  Ampicillin-Sulbactam (UNASYN) 3 g in sodium chloride 0.9 % 100 mL  IVPB        3 g 200 mL/hr over 30 Minutes Intravenous Every 6 hours 04/20/23 1126 04/24/23 2024   04/19/23 1000  piperacillin-tazobactam (ZOSYN) IVPB 3.375 g  Status:  Discontinued        3.375 g 12.5 mL/hr over 240 Minutes Intravenous Every 8 hours 04/19/23 0859 04/20/23 1125   04/19/23 0900  linezolid (ZYVOX) IVPB 600 mg  Status:  Discontinued        600 mg 300 mL/hr over 60 Minutes Intravenous Every 12 hours 04/19/23 0849 04/20/23 1125   04/19/23 0345  piperacillin-tazobactam (ZOSYN) IVPB 3.375 g        3.375 g 100 mL/hr over 30 Minutes Intravenous  Once 04/19/23 0330 04/19/23 0414   04/18/23 2200  cefTRIAXone (ROCEPHIN) 2 g in sodium chloride 0.9 % 100 mL IVPB        2 g 200 mL/hr over 30 Minutes Intravenous Once 04/18/23 2148 04/18/23 2234   04/18/23 2200  azithromycin (ZITHROMAX) 500 mg in sodium chloride 0.9 % 250 mL IVPB        500 mg 250 mL/hr over 60 Minutes Intravenous  Once 04/18/23 2148 04/19/23 0027       Subjective: No new complaints  Objective: Vitals:   04/24/23 2101 04/25/23 0456 04/25/23 1210 04/25/23 1250  BP: (!) 147/53 (!) 145/62 (!) 156/62   Pulse: 80 95 87   Resp: 20 16 16    Temp: 98.2 F (36.8 C) 97.7 F (  36.5 C) 98.2 F (36.8 C)   TempSrc: Oral Oral Oral   SpO2: 96% 95% 97% 92%  Weight:  41.9 kg    Height:        Intake/Output Summary (Last 24 hours) at 04/25/2023 1630 Last data filed at 04/25/2023 0500 Gross per 24 hour  Intake --  Output 200 ml  Net -200 ml   Filed Weights   04/23/23 0431 04/24/23 0447 04/25/23 0456  Weight: 42.6 kg 42.2 kg 41.9 kg    Examination:  General: No acute distress. Cardiovascular: RRR Lungs: diminished Neurological: Alert and oriented 3. Moves all extremities 4 with equal strength. Cranial nerves II through XII grossly intact. Extremities: No clubbing or cyanosis. No edema.   Data Reviewed: I have personally reviewed following labs and imaging studies  CBC: Recent Labs  Lab 04/21/23 0427  04/22/23 0357 04/23/23 0406 04/24/23 0434 04/25/23 0347  WBC 5.3 6.2 4.8 5.5 5.7  NEUTROABS 3.9 4.7 3.3 3.8 4.2  HGB 7.8* 9.3* 9.5* 9.2* 8.8*  HCT 26.4* 29.9* 31.4* 30.1* 29.1*  MCV 111.9* 107.6* 109.4* 109.1* 109.0*  PLT 153 187 176 163 135*    Basic Metabolic Panel: Recent Labs  Lab 04/21/23 0427 04/22/23 0357 04/23/23 0406 04/24/23 0434 04/25/23 0347  NA 136 135 135 136 136  K 3.4* 3.8 3.6 3.7 3.3*  CL 100 94* 93* 92* 95*  CO2 31 32 31 34* 32  GLUCOSE 92 94 85 101* 93  BUN 13 9 8  7* 6*  CREATININE 0.77 0.67 0.69 0.78 0.90  CALCIUM 8.1* 8.3* 8.4* 8.2* 8.2*  MG 1.8 1.8 1.9 1.9 2.0    GFR: Estimated Creatinine Clearance: 35.7 mL/min (by C-G formula based on SCr of 0.9 mg/dL).  Liver Function Tests: Recent Labs  Lab 04/18/23 2156  AST 35  ALT 23  ALKPHOS 111  BILITOT 0.7  PROT 7.0  ALBUMIN 3.5    CBG: No results for input(s): "GLUCAP" in the last 168 hours.   Recent Results (from the past 240 hours)  Blood Culture (routine x 2)     Status: None   Collection Time: 04/18/23  1:22 AM   Specimen: BLOOD  Result Value Ref Range Status   Specimen Description   Final    BLOOD LEFT ANTECUBITAL Performed at [facility], [address] [place], [place] [address]    Special Requests   Final    BOTTLES DRAWN AEROBIC AND ANAEROBIC Blood Culture results may not be optimal due to an inadequate volume of blood received in culture bottles Performed at [facility], [address] [place], [place] [address]    Culture   Final    NO GROWTH 5 DAYS Performed at [facility], [address], [place], [place] [address]    Report Status 04/24/2023 FINAL  Final  Blood Culture (routine x 2)     Status: None   Collection Time: 04/18/23  9:46 PM   Specimen: BLOOD LEFT ARM  Result Value Ref Range Status   Specimen Description   Final    BLOOD LEFT ARM Performed at [facility], [address] [place], [place]  [address]    Special Requests   Final    BOTTLES DRAWN AEROBIC AND ANAEROBIC Blood Culture adequate volume Performed at [facility], [address] [place], [place] [address]    Culture   Final    NO GROWTH 5 DAYS Performed at [facility], [address] [place], [place] [address]    Report Status 04/23/2023 FINAL  Final  Resp panel by RT-PCR (RSV, Flu Ernesha Ramone&B, Covid) Anterior Nasal Swab     Status: None   Collection Time: 04/18/23  9:48 PM   Specimen: Anterior Nasal Swab  Result Value Ref Range Status   SARS Coronavirus 2 by RT PCR NEGATIVE NEGATIVE Final    Comment: (NOTE) SARS-CoV-2 target nucleic acids are NOT DETECTED.  The SARS-CoV-2 RNA is generally detectable in upper respiratory specimens during the acute phase of infection. The lowest concentration of SARS-CoV-2 viral copies this assay can detect is 138 copies/mL. Margy Sumler negative result does not preclude SARS-Cov-2 infection and should not be used as the sole basis for treatment or other patient management decisions. Laren Orama negative result may occur with  improper specimen collection/handling, submission of specimen other than nasopharyngeal swab, presence of viral mutation(s) within the areas targeted by this assay, and inadequate number of viral copies(<138 copies/mL). Garvin Ellena negative result must be combined with clinical observations, patient history, and epidemiological information. The expected result is Negative.  Fact Sheet for Patients:  BloggerCourse.com  Fact Sheet for Healthcare Providers:  SeriousBroker.it  This test is no t yet approved or cleared by the [place] FDA and  has been authorized for detection and/or diagnosis of SARS-CoV-2 by FDA under an Emergency Use Authorization (EUA). This EUA will remain  in effect (meaning this test can be used) for the duration of the COVID-19 declaration under Section 564(b)(1) of the Act,  21 U.S.C.section 360bbb-3(b)(1), unless the authorization is terminated  or revoked sooner.       Influenza Jahzara Slattery by PCR NEGATIVE NEGATIVE Final   Influenza B by PCR NEGATIVE NEGATIVE Final    Comment: (NOTE) The Xpert Xpress SARS-CoV-2/FLU/RSV plus assay is intended as an aid in the diagnosis of influenza from Nasopharyngeal swab specimens and should not be used as Niajah Sipos sole basis for treatment. Nasal washings and aspirates are unacceptable for Xpert Xpress SARS-CoV-2/FLU/RSV testing.  Fact Sheet for Patients: BloggerCourse.com  Fact Sheet for Healthcare Providers: SeriousBroker.it  This test is not yet approved or cleared by the [place] FDA and has been authorized for detection and/or diagnosis of SARS-CoV-2 by FDA under an Emergency Use Authorization (EUA). This EUA will remain in effect (meaning this test can be used) for the duration of the COVID-19 declaration under Section 564(b)(1) of the Act, 21 U.S.C. section 360bbb-3(b)(1), unless the authorization is terminated or revoked.     Resp Syncytial Virus by PCR NEGATIVE NEGATIVE Final    Comment: (NOTE) Fact Sheet for Patients: BloggerCourse.com  Fact Sheet for Healthcare Providers: SeriousBroker.it  This test is not yet approved or cleared by the [place] FDA and has been authorized for detection and/or diagnosis of SARS-CoV-2 by FDA under an Emergency Use Authorization (EUA). This EUA will remain in effect (meaning this test can be used) for the duration of the COVID-19 declaration under Section 564(b)(1) of the Act, 21 U.S.C. section 360bbb-3(b)(1), unless the authorization is terminated or revoked.  Performed at [facility], [address] [place], [place] [address]   Body fluid culture w Gram Stain     Status: None   Collection Time: 04/19/23  3:29 PM   Specimen: PATH Cytology Pleural  fluid  Result Value Ref Range Status   Specimen Description   Final    PLEURAL Performed at [facility], [address] [place], [place] [address]    Special Requests  Final    NONE Performed at [facility], [address] [place], [place] [address]    Gram Stain NO WBC SEEN NO ORGANISMS SEEN   Final   Culture   Final    NO GROWTH 3 DAYS Performed at [facility], [address] [place], [place] [address]    Report Status 04/23/2023 FINAL  Final  MRSA Next Gen by PCR, Nasal     Status: None   Collection Time: 04/19/23  4:50 PM   Specimen: Nasal Mucosa; Nasal Swab  Result Value Ref Range Status   MRSA by PCR Next Gen NOT DETECTED NOT DETECTED Final    Comment: (NOTE) The GeneXpert MRSA Assay (FDA approved for NASAL specimens only), is one component of Rhianne Soman comprehensive MRSA colonization surveillance program. It is not intended to diagnose MRSA infection nor to guide or monitor treatment for MRSA infections. Test performance is not FDA approved in patients less than 57 years old. Performed at [facility], [address] [place], [place] [address]          Radiology Studies: DG Knee 1-2 Views Right Result Date: 04/24/2023 CLINICAL DATA:  Pain since surgery 04/02/2023. EXAM: RIGHT KNEE - 1-2 VIEW COMPARISON:  Right knee radiographs 03/31/2023 FINDINGS: There is diffuse decreased bone mineralization. Minimal chronic enthesopathic change at the quadriceps insertion on the patella. Tiny joint effusion. No significant joint space narrowing. No acute fracture or dislocation. Mild atherosclerotic calcifications. IMPRESSION: Tiny joint effusion. No acute fracture. Electronically Signed   By: Neita Garnet M.D.   On: 04/24/2023 16:09   DG HIP UNILAT WITH PELVIS 2-3 VIEWS RIGHT Result Date: 04/24/2023 CLINICAL DATA:  Right hip pain after surgery 04/02/2023. EXAM: DG HIP (WITH OR WITHOUT PELVIS) 2-3V RIGHT COMPARISON:   Pelvis and right hip radiographs 04/02/2023 FINDINGS: Redemonstration of cephalomedullary nail fixation of proximal femoral intertrochanteric fracture. No perihardware lucency is seen to indicate hardware failure or loosening. Unchanged alignment. There is again approximately 1 cm medial displacement of the right lesser trochanter. Mild bilateral sacroiliac subchondral sclerosis and minimal superior pubic symphysis joint space narrowing. IMPRESSION: Unchanged alignment of proximal femoral intertrochanteric fracture status post cephalomedullary nail fixation. No evidence of hardware failure. Electronically Signed   By: Neita Garnet M.D.   On: 04/24/2023 16:08         Scheduled Meds:  amiodarone  200 mg Oral Daily   aspirin EC  81 mg Oral Daily   atorvastatin  20 mg Oral QPM   clotrimazole  10 mg Oral 5 X Daily   vitamin B-12  1,000 mcg Oral Daily   docusate sodium  100 mg Oral BID   enoxaparin (LOVENOX) injection  30 mg Subcutaneous Q24H   furosemide  40 mg Intravenous Once   ipratropium-albuterol  3 mL Nebulization BID   lactose free nutrition  237 mL Oral BID BM   magnesium oxide  400 mg Oral Daily   potassium chloride  40 mEq Oral Q4H   Continuous Infusions:     LOS: 6 days    Time spent: over 30 min    Lacretia Nicks, MD [facility]   To contact the attending provider between 7A-7P or the covering provider during after hours 7P-7A, please log into the web site [email or web address] and access using universal [place] password for that web site. If you do not have the password, please call the hospital operator.  04/25/2023, 4:30 PM

## 2023-04-26 ENCOUNTER — Inpatient Hospital Stay (HOSPITAL_COMMUNITY): Payer: 59

## 2023-04-26 ENCOUNTER — Ambulatory Visit (HOSPITAL_COMMUNITY): Payer: 59

## 2023-04-26 ENCOUNTER — Inpatient Hospital Stay: Payer: 59

## 2023-04-26 DIAGNOSIS — J189 Pneumonia, unspecified organism: Secondary | ICD-10-CM | POA: Diagnosis not present

## 2023-04-26 LAB — BODY FLUID CELL COUNT WITH DIFFERENTIAL
Lymphs, Fluid: 25 %
Monocyte-Macrophage-Serous Fluid: 32 % — ABNORMAL LOW (ref 50–90)
Neutrophil Count, Fluid: 43 % — ABNORMAL HIGH (ref 0–25)
Total Nucleated Cell Count, Fluid: 607 uL (ref 0–1000)

## 2023-04-26 LAB — LACTATE DEHYDROGENASE, PLEURAL OR PERITONEAL FLUID: LD, Fluid: 93 U/L — ABNORMAL HIGH (ref 3–23)

## 2023-04-26 LAB — LACTATE DEHYDROGENASE: LDH: 157 U/L (ref 98–192)

## 2023-04-26 MED ORDER — LIDOCAINE HCL 1 % IJ SOLN
INTRAMUSCULAR | Status: AC
  Filled 2023-04-26: qty 20

## 2023-04-26 MED ORDER — FUROSEMIDE 10 MG/ML IJ SOLN
40.0000 mg | Freq: Every day | INTRAMUSCULAR | Status: AC
  Administered 2023-04-26: 40 mg via INTRAVENOUS
  Filled 2023-04-26: qty 4

## 2023-04-26 NOTE — TOC Progression Note (Signed)
 Transition of Care [facility]) - Progression Note    Patient Details  Name: Mauriana Dann [facility] MRN: [id number] Date of Birth: 1945-10-17  Transition of [facility] CM/SW Contact  Otelia Santee, LCSW Phone Number: 04/26/2023, 3:21 PM  Clinical Narrative:    Need to request new SNF auth once updated PT note in.    Expected Discharge Plan:  (TBD) Barriers to Discharge: Continued Medical Work up  Expected Discharge Plan and Services                                               Social Determinants of Health (SDOH) Interventions SDOH Screenings   Food Insecurity: No Food Insecurity (04/19/2023)  Housing: Low Risk  (04/19/2023)  Transportation Needs: No Transportation Needs (04/19/2023)  Utilities: Not At Risk (04/19/2023)  Depression (PHQ2-9): Low Risk  (03/17/2023)  Recent Concern: Depression (PHQ2-9) - Medium Risk (02/03/2023)  Financial Resource Strain: Low Risk  (03/17/2023)  Physical Activity: Inactive (03/17/2023)  Social Connections: Socially Isolated (04/19/2023)  Stress: No Stress Concern Present (03/17/2023)  Tobacco Use: Medium Risk (04/18/2023)  Health Literacy: Adequate Health Literacy (03/17/2023)    Readmission Risk Interventions    10/09/2022    2:31 PM  Readmission Risk Prevention Plan  Transportation Screening Complete  PCP or Specialist Appt within 3-5 Days Complete  HRI or Home Care Consult Complete  Social Work Consult for Recovery Care Planning/Counseling Complete  Palliative Care Screening Not Applicable  Medication Review Oceanographer) Complete

## 2023-04-26 NOTE — Procedures (Signed)
 PROCEDURE SUMMARY:  Successful US guided left thoracentesis. Yielded 370 mL of red tinted clear fluid. Patient tolerated procedure well. No immediate complications. EBL = trace  Specimen was sent for labs.  Post procedure chest X-ray reveals no pneumothorax  Loman Brooklyn PA-C 04/26/2023 11:58 AM

## 2023-04-26 NOTE — Progress Notes (Signed)
 PT Cancellation Note  Patient Details Name: [place] Spainhower [facility] MRN: [id number] DOB: 12-18-1945   Cancelled Treatment:     attempted to see in PM.  Pt "wore out" and requested I return in am.  Pt did have a Thoracentesis early.  Will attempt to see in am.    Felecia Shelling  PTA Acute  Rehabilitation Services Office M-F          [phone]

## 2023-04-26 NOTE — Evaluation (Signed)
 Clinical/Bedside Swallow Evaluation Patient Details  Name: Sheena [name] [facility] MRN: [id number] Date of Birth: [birth year]  Today's Date: 04/26/2023 Time: SLP Start Time (ACUTE ONLY): 0950 SLP Stop Time (ACUTE ONLY): 1020 SLP Time Calculation (min) (ACUTE ONLY): 30 min  Past Medical History:  Past Medical History:  Diagnosis Date   Anxiety    Heart murmur    Hypertension    Primary small cell carcinoma of lower lobe of left lung (HCC)    Scarlet fever with other complications childhood   "had to learn to work again" has had leg weakness   Past Surgical History:  Past Surgical History:  Procedure Laterality Date   BRONCHIAL NEEDLE ASPIRATION BIOPSY  06/30/2022   Procedure: BRONCHIAL NEEDLE ASPIRATION BIOPSIES;  Surgeon: [name], DO;  Location: [facility];  Service: Pulmonary;;   INTRAMEDULLARY (IM) NAIL INTERTROCHANTERIC Right 04/02/2023   Procedure: RIGHT INTRAMEDULLARY (IM) NAIL INTERTROCHANTERIC;  Surgeon: Roby Lofts, MD;  Location: [facility] OR;  Service: Orthopedics;  Laterality: Right;   IR IMAGING GUIDED PORT INSERTION  07/31/2022   IR KYPHO LUMBAR INC FX REDUCE BONE BX UNI/BIL CANNULATION INC/IMAGING  12/17/2022   IR US GUIDE VASC ACCESS LEFT  12/17/2022   KYPHOPLASTY     2014   peridontal surgery     VIDEO BRONCHOSCOPY WITH ENDOBRONCHIAL ULTRASOUND N/A 06/30/2022   Procedure: VIDEO BRONCHOSCOPY WITH ENDOBRONCHIAL ULTRASOUND;  Surgeon: [name], DO;  Location: [facility];  Service: Pulmonary;  Laterality: N/A;   HPI:  Patient is a 78 y.o. female with PMH: HTN, paroxysmal a-fib, breast cancer, recent hospital stay at [facility] in January of 2025 for hip fracture. She presented to hospital on 04/18/23 from SNF [facility]) with acute on chronic hypoxic respiratory failure due to healthcare acquired PNA. At baseline she is on 2L supplemental oxygen via [place]. She was found to be at 85% SpO2 at her facility and was placed on 10L NRB. CT angio/chest/PE showed left sided  pleural effusion with associated left lower lobe consolidation, right lung demonstrates inspissated material within the right lower lobe bronchial tree with right lower lobe collapse with radiologist reporting it may represent a large mucous plug or possible aspirated material. SLP ordered for swallow evaluation.    Assessment / Plan / Recommendation  Clinical Impression  Patient is presenting with a WFL oropharyngeal swallow as per this bedside swallow evaluation. She told SLP "all I want is a sausage biscuit". SLP helped patient with setup and she completed oral care. Swallow was assessed via PO's of thin liquids via straw, puree solids (when taking medications crushed in puree) and regular solids (sausage biscuit). Patient benefited from SLP assisting with cutting up food but she was able to feed self. Mastication was perhaps mildly prolonged but was functional and complete. SLP recommending advance to regular solids, thin liquids and no f/u required. SLP Visit Diagnosis: Dysphagia, unspecified (R13.10)    Aspiration Risk  Mild aspiration risk    Diet Recommendation Regular;Thin liquid    Liquid Administration via: Cup;Straw Medication Administration: Crushed with puree Supervision: Patient able to self feed Compensations: Slow rate;Small sips/bites Postural Changes: Seated upright at 90 degrees;Remain upright for at least 30 minutes after po intake    Other  Recommendations Oral Care Recommendations: Oral care BID;Patient independent with oral care    Recommendations for follow up therapy are one component of a multi-disciplinary discharge planning process, led by the attending physician.  Recommendations may be updated based on patient status, additional functional criteria and insurance  authorization.  Follow up Recommendations No SLP follow up      Assistance Recommended at Discharge    Functional Status Assessment Patient has not had a recent decline in their functional status   Frequency and Duration   N/A         Prognosis   N/A     Swallow Study   General Date of Onset: 04/26/23 HPI: Patient is a 78 y.o. female with PMH: HTN, paroxysmal a-fib, breast cancer, recent hospital stay at [facility] in January of 2025 for hip fracture. She presented to hospital on 04/18/23 from SNF [facility]) with acute on chronic hypoxic respiratory failure due to healthcare acquired PNA. At baseline she is on 2L supplemental oxygen via [place]. She was found to be at 85% SpO2 at her facility and was placed on 10L NRB. CT angio/chest/PE showed left sided pleural effusion with associated left lower lobe consolidation, right lung demonstrates inspissated material within the right lower lobe bronchial tree with right lower lobe collapse with radiologist reporting it may represent a large mucous plug or possible aspirated material. SLP ordered for swallow evaluation. Type of Study: Bedside Swallow Evaluation Previous Swallow Assessment: 04/22/2023 BSE Diet Prior to this Study: Dysphagia 3 (mechanical soft);Thin liquids (Level 0) Temperature Spikes Noted: No Respiratory Status: Nasal cannula History of Recent Intubation: No Behavior/Cognition: Alert;Cooperative;Pleasant mood Oral Cavity Assessment: Within Functional Limits Oral Care Completed by SLP: Other (Comment) (oral care completed by patient) Oral Cavity - Dentition: Adequate natural dentition Vision: Functional for self-feeding Self-Feeding Abilities: Able to feed self Patient Positioning: Upright in bed Baseline Vocal Quality: Normal;Low vocal intensity Volitional Swallow: Able to elicit    Oral/Motor/Sensory Function Overall Oral Motor/Sensory Function: Within functional limits   Ice Chips     Thin Liquid Thin Liquid: Within functional limits Presentation: Straw;Self Fed    Nectar Thick     Honey Thick     Puree Puree: Within functional limits Presentation: Spoon   Solid     Solid: Within functional limits      Angela Nevin, MA, CCC-SLP Speech Therapy

## 2023-04-26 NOTE — Plan of Care (Signed)

## 2023-04-26 NOTE — Plan of Care (Signed)
   Problem: Coping: Goal: Level of anxiety will decrease Outcome: Progressing

## 2023-04-26 NOTE — Progress Notes (Addendum)
 PROGRESS NOTE    Sheena [name] [facility]  MWU:[id number] DOB: March 16, 1945 DOA: 04/18/2023 PCP: Sheena Majestic, MD  No chief complaint on file.   Brief Narrative:   Sheena [name] is Sheena [name] 78 y.o. female with PMH SCLC (dx 06/2022) s/p chemoradiation (declined prophylactic cranial irradiation). Also PMH PAF no AC, HTN, breast cancer. She had recent hospitalization from 03/31/2023 until 04/06/2023 due to right hip fracture and underwent surgical repair.  She was discharged to rehab and has had difficulty with recovery and worsening weakness/debility. She was brought back to the hospital due to respiratory symptoms and found to be hypoxic at [facility] requiring 10 L nonrebreather initially. Workup showed worsening left-sided pleural effusion and new right sided pleural effusion. She underwent left-sided thoracentesis removing 400 cc on 04/19/2023.  Assessment & Plan:   Principal Problem:   HCAP (healthcare-associated pneumonia) Active Problems:   Primary small cell carcinoma of lower lobe of left lung (HCC)   Pleural effusion   Dysarthria   Physical deconditioning   Closed intertrochanteric fracture of hip, right, initial encounter (HCC) - s/p ORIF 04-02-2023 with IM nail   Anxiety   Essential hypertension, benign   Hyperlipidemia   PAF (paroxysmal atrial fibrillation) (HCC) - not on systemic anticoagulation due to hx of GI bleeding   Macrocytic anemia   Sepsis with acute hypoxic respiratory failure without septic shock (HCC)  Pleural effusion - Seen previously on prior CT chest at least from November 2024; has now worsened - No prior thoracentesis has been performed; concern would be malignant effusion - Patient underwent thoracentesis on 04/19/2023, 400 cc removed - Follow-up thoracentesis cytology pending -- continue lasix as tolerated -- 2 view CXR with large layering L effusion -> repeat thora done 2/24 with 370 cc out (follow pending labs)  Pneumonia - per CTA chest: "Inspissated  material within the right middle and lower lobe bronchial tree with collapse of both lobes identified." -Cell count unimpressive on thoracentesis.  No serum LDH collected.  Appears fluid is likely transudate based on pleural fluid protein.  - for now will continue abx, de-escalate to unasyn (s/p 7 days abx) - follow up SLP eval -> dysphagia 3, thin - will follow continued evals, she wants to advance her diet   Physical deconditioning - daughter notes she hasn't walked since before her fracture - I agree this is concerning for her rehab potential  - s/p IM nailing of right femur fx on 04/02/23 - Had not been thriving well in rehab.  With progressive deconditioning and weakness -Concern multifactorial with underlying malignancy along with advanced age with recent surgery and generalized malnourished state - PT/OT evals ordered - Low threshold for palliative care consult; oncology also consulted  Closed intertrochanteric fracture of hip, right, initial encounter [facility]) - s/p ORIF 04-02-2023 with IM nail - continue with PT/OT - had been at [facility] since discharge  - Okay for oxycodone for pain control along with Tylenol as needed - Morphine for breakthrough pain - follow imaging of L hip (unchanged alignment of proximal femoral intertrochanteric fracture status post cephalomedullary nail fixation) and knee (tiny effusion)  Poor PO intake  Failure To Thrive - recently seems like she's been doing poorly - not taking great PO, hasn't been able to participate with therapy as much  - she's improved, now on [name] regular diet - will c/s RD  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 Arbutus Ped  - seen by oncology 2/20  -  patient already DNR; await further workup; if no considerable improvement then may need further GOC discussions with palliative care - MRI brain without intracranial metastatic disease - currently on surveillance  - follow up cytology  (pending) from thoracentesis   Dysarthria - Patient's son states her speech is usually very clear with no dysarthria or hesitations; this is clearly different than her baseline; there is also very subtle left facial droop and she also has right upper extremity weakness compared to the left -Repeat MRI brain without intracranial metastatic disease   Thrush - clotrimazole troche   Macrocytic anemia - low/normal B12 234 on 04/01/23 - continue supplementation    PAF (paroxysmal atrial fibrillation) (HCC) - not on systemic anticoagulation due to hx of GI bleeding - Not on anticoagulation - Rate controlled; continue amiodarone - metoprolol on hold   Hyperlipidemia - Continue Lipitor although likely no further mortality benefit at this time - discuss deprescribing with outpatient pcp - can consider during this hospitalization   Essential hypertension, benign -BP appropriate, not take amlodipine per med rec - metoprolol on hold   Anxiety - Currently on no controlled substances     DVT prophylaxis: lovenox Code Status: DNR Family Communication: daughter Disposition:   Status is: Inpatient Remains inpatient appropriate because: need for continued inpatient care   Consultants:  pulmonology  Procedures:  Echo IMPRESSIONS     1. Left ventricular ejection fraction, by estimation, is 65 to 70%. The  left ventricle has normal function. The left ventricle has no regional  wall motion abnormalities. Left ventricular diastolic parameters are  indeterminate. Elevated left atrial  pressure.   2. Right ventricular systolic function is hyperdynamic. The right  ventricular size is normal. Tricuspid regurgitation signal is inadequate  for assessing PA pressure.   3. The mitral valve is normal in structure. No evidence of mitral valve  regurgitation. No evidence of mitral stenosis.   4. The aortic valve is tricuspid. Aortic valve regurgitation is trivial.  No aortic stenosis is present.    5. The inferior vena cava is dilated in size with <50% respiratory  variability, suggesting right atrial pressure of 15 mmHg.   Comparison(s): Prior images reviewed side by side. IVC more dilated.   Antimicrobials:  Anti-infectives (From admission, onward)    Start     Dose/Rate Route Frequency Ordered Stop   04/20/23 1215  Ampicillin-Sulbactam (UNASYN) 3 g in sodium chloride 0.9 % 100 mL IVPB        3 g 200 mL/hr over 30 Minutes Intravenous Every 6 hours 04/20/23 1126 04/24/23 2024   04/19/23 1000  piperacillin-tazobactam (ZOSYN) IVPB 3.375 g  Status:  Discontinued        3.375 g 12.5 mL/hr over 240 Minutes Intravenous Every 8 hours 04/19/23 0859 04/20/23 1125   04/19/23 0900  linezolid (ZYVOX) IVPB 600 mg  Status:  Discontinued        600 mg 300 mL/hr over 60 Minutes Intravenous Every 12 hours 04/19/23 0849 04/20/23 1125   04/19/23 0345  piperacillin-tazobactam (ZOSYN) IVPB 3.375 g        3.375 g 100 mL/hr over 30 Minutes Intravenous  Once 04/19/23 0330 04/19/23 0414   04/18/23 2200  cefTRIAXone (ROCEPHIN) 2 g in sodium chloride 0.9 % 100 mL IVPB        2 g 200 mL/hr over 30 Minutes Intravenous Once 04/18/23 2148 04/18/23 2234   04/18/23 2200  azithromycin (ZITHROMAX) 500 mg in sodium chloride 0.9 % 250 mL IVPB  500 mg 250 mL/hr over 60 Minutes Intravenous  Once 04/18/23 2148 04/19/23 0027       Subjective: No complaints  Objective: Vitals:   04/26/23 0946 04/26/23 1138 04/26/23 1140 04/26/23 1155  BP:  (!) 132/54 129/72 (!) 145/83  Pulse:      Resp:      Temp:      TempSrc:      SpO2: 95%     Weight:      Height:        Intake/Output Summary (Last 24 hours) at 04/26/2023 1639 Last data filed at 04/26/2023 0630 Gross per 24 hour  Intake --  Output 800 ml  Net -800 ml   Filed Weights   04/24/23 0447 04/25/23 0456 04/26/23 0623  Weight: 42.2 kg 41.9 kg 39.5 kg    Examination:  General: No acute distress. Cardiovascular: RRR Lungs: unlabored,  diminished Abdomen: s/nt/nd Neurological: Alert and oriented 3. Moves all extremities 4 with equal strength. Cranial nerves II through XII grossly intact. Extremities: No clubbing or cyanosis. No edema.   Data Reviewed: I have personally reviewed following labs and imaging studies  CBC: Recent Labs  Lab 04/21/23 0427 04/22/23 0357 04/23/23 0406 04/24/23 0434 04/25/23 0347  WBC 5.3 6.2 4.8 5.5 5.7  NEUTROABS 3.9 4.7 3.3 3.8 4.2  HGB 7.8* 9.3* 9.5* 9.2* 8.8*  HCT 26.4* 29.9* 31.4* 30.1* 29.1*  MCV 111.9* 107.6* 109.4* 109.1* 109.0*  PLT 153 187 176 163 135*    Basic Metabolic Panel: Recent Labs  Lab 04/21/23 0427 04/22/23 0357 04/23/23 0406 04/24/23 0434 04/25/23 0347  NA 136 135 135 136 136  K 3.4* 3.8 3.6 3.7 3.3*  CL 100 94* 93* 92* 95*  CO2 31 32 31 34* 32  GLUCOSE 92 94 85 101* 93  BUN 13 9 8  7* 6*  CREATININE 0.77 0.67 0.69 0.78 0.90  CALCIUM 8.1* 8.3* 8.4* 8.2* 8.2*  MG 1.8 1.8 1.9 1.9 2.0    GFR: Estimated Creatinine Clearance: 33.7 mL/min (by C-G formula based on SCr of 0.9 mg/dL).  Liver Function Tests: No results for input(s): "AST", "ALT", "ALKPHOS", "BILITOT", "PROT", "ALBUMIN" in the last 168 hours.   CBG: No results for input(s): "GLUCAP" in the last 168 hours.   Recent Results (from the past 240 hours)  Blood Culture (routine x 2)     Status: None   Collection Time: 04/18/23  1:22 AM   Specimen: BLOOD  Result Value Ref Range Status   Specimen Description   Final    BLOOD LEFT ANTECUBITAL Performed at [facility], [address] [place], [place] [address]    Special Requests   Final    BOTTLES DRAWN AEROBIC AND ANAEROBIC Blood Culture results may not be optimal due to an inadequate volume of blood received in culture bottles Performed at [facility], [address] [place], [place] [address]    Culture   Final    NO GROWTH 5 DAYS Performed at [facility], [address], [place], [place] [address]     Report Status 04/24/2023 FINAL  Final  Blood Culture (routine x 2)     Status: None   Collection Time: 04/18/23  9:46 PM   Specimen: BLOOD LEFT ARM  Result Value Ref Range Status   Specimen Description   Final    BLOOD LEFT ARM Performed at [facility], [address], [place], [place] [address]    Special Requests   Final  BOTTLES DRAWN AEROBIC AND ANAEROBIC Blood Culture adequate volume Performed at [facility], [address] [place], [place] [address]    Culture   Final    NO GROWTH 5 DAYS Performed at [facility], [address] [place], [place] [address]    Report Status 04/23/2023 FINAL  Final  Resp panel by RT-PCR (RSV, Flu Tabithia Stroder&B, Covid) Anterior Nasal Swab     Status: None   Collection Time: 04/18/23  9:48 PM   Specimen: Anterior Nasal Swab  Result Value Ref Range Status   SARS Coronavirus 2 by RT PCR NEGATIVE NEGATIVE Final    Comment: (NOTE) SARS-CoV-2 target nucleic acids are NOT DETECTED.  The SARS-CoV-2 RNA is generally detectable in upper respiratory specimens during the acute phase of infection. The lowest concentration of SARS-CoV-2 viral copies this assay can detect is 138 copies/mL. Nanci Lakatos negative result does not preclude SARS-Cov-2 infection and should not be used as the sole basis for treatment or other patient management decisions. Rielly Brunn negative result may occur with  improper specimen collection/handling, submission of specimen other than nasopharyngeal swab, presence of viral mutation(s) within the areas targeted by this assay, and inadequate number of viral copies(<138 copies/mL). Pricilla Moehle negative result must be combined with clinical observations, patient history, and epidemiological information. The expected result is Negative.  Fact Sheet for Patients:  BloggerCourse.com  Fact Sheet for Healthcare Providers:  SeriousBroker.it  This test is no t yet approved  or cleared by the [place] FDA and  has been authorized for detection and/or diagnosis of SARS-CoV-2 by FDA under an Emergency Use Authorization (EUA). This EUA will remain  in effect (meaning this test can be used) for the duration of the COVID-19 declaration under Section 564(b)(1) of the Act, 21 U.S.C.section 360bbb-3(b)(1), unless the authorization is terminated  or revoked sooner.       Influenza Joey Lierman by PCR NEGATIVE NEGATIVE Final   Influenza B by PCR NEGATIVE NEGATIVE Final    Comment: (NOTE) The Xpert Xpress SARS-CoV-2/FLU/RSV plus assay is intended as an aid in the diagnosis of influenza from Nasopharyngeal swab specimens and should not be used as Kanoa Phillippi sole basis for treatment. Nasal washings and aspirates are unacceptable for Xpert Xpress SARS-CoV-2/FLU/RSV testing.  Fact Sheet for Patients: BloggerCourse.com  Fact Sheet for Healthcare Providers: SeriousBroker.it  This test is not yet approved or cleared by the [place] FDA and has been authorized for detection and/or diagnosis of SARS-CoV-2 by FDA under an Emergency Use Authorization (EUA). This EUA will remain in effect (meaning this test can be used) for the duration of the COVID-19 declaration under Section 564(b)(1) of the Act, 21 U.S.C. section 360bbb-3(b)(1), unless the authorization is terminated or revoked.     Resp Syncytial Virus by PCR NEGATIVE NEGATIVE Final    Comment: (NOTE) Fact Sheet for Patients: BloggerCourse.com  Fact Sheet for Healthcare Providers: SeriousBroker.it  This test is not yet approved or cleared by the [place] FDA and has been authorized for detection and/or diagnosis of SARS-CoV-2 by FDA under an Emergency Use Authorization (EUA). This EUA will remain in effect (meaning this test can be used) for the duration of the COVID-19 declaration under Section 564(b)(1) of the Act,  21 U.S.C. section 360bbb-3(b)(1), unless the authorization is terminated or revoked.  Performed at [facility], [address] [place], [place] [address]   Body fluid culture w Gram Stain     Status: None   Collection Time: 04/19/23  3:29 PM  Specimen: PATH Cytology Pleural fluid  Result Value Ref Range Status   Specimen Description   Final    PLEURAL Performed at [facility], [address], [place], [place] [address]    Special Requests   Final    NONE Performed at [facility], [address] [place], [place] [address]    Gram Stain NO WBC SEEN NO ORGANISMS SEEN   Final   Culture   Final    NO GROWTH 3 DAYS Performed at [facility], [address], [place], [place] [address]    Report Status 04/23/2023 FINAL  Final  MRSA Next Gen by PCR, Nasal     Status: None   Collection Time: 04/19/23  4:50 PM   Specimen: Nasal Mucosa; Nasal Swab  Result Value Ref Range Status   MRSA by PCR Next Gen NOT DETECTED NOT DETECTED Final    Comment: (NOTE) The GeneXpert MRSA Assay (FDA approved for NASAL specimens only), is one component of Walt Geathers comprehensive MRSA colonization surveillance program. It is not intended to diagnose MRSA infection nor to guide or monitor treatment for MRSA infections. Test performance is not FDA approved in patients less than 22 years old. Performed at [facility], [address] [place], [place] [address]   Body fluid culture w Gram Stain     Status: None (Preliminary result)   Collection Time: 04/26/23 12:01 PM   Specimen: PATH Cytology Pleural fluid  Result Value Ref Range Status   Specimen Description   Final    PLEURAL Performed at [facility], [address], [place], [place] [address]    Special Requests   Final    NONE Performed at [facility], [address] [place], [place] [address]    Gram Stain   Final    NO WBC  SEEN NO ORGANISMS SEEN Performed at [facility], [address] [place], [place] [address]    Culture PENDING  Incomplete   Report Status PENDING  Incomplete         Radiology Studies: DG Chest 1 View Result Date: 04/26/2023 CLINICAL DATA:  [id number] S/P thoracentesis [id number] EXAM: PORTABLE CHEST 1 VIEW COMPARISON:  Chest XR, 04/25/2023 and 04/19/2023. CT chest, 04/19/2023. FINDINGS: Support lines: RIGHT chest port with catheter tip within the SVC. Cardiac silhouette is within normal limits. Aortic arch atherosclerosis. Hyperinflation with relative flattening of the diaphragm. The RIGHT lung is clear. Improved aeration of the LEFT lung post thoracentesis with no pneumothorax or residual pleural effusion. Trace patchy retrocardiac opacity. Thoracolumbar vertebral augmentation change. No interval osseous abnormality. IMPRESSION: 1. Improved aeration of the LEFT lung post thoracentesis. No pneumothorax or residual pleural effusion. 2. Aortic Atherosclerosis (ICD10-I70.0) and Emphysema (ICD10-J43.9). Electronically Signed   By: Roanna Banning M.D.   On: 04/26/2023 14:34   US THORACENTESIS ASP PLEURAL SPACE W/IMG GUIDE Result Date: 04/26/2023 INDICATION: 78 year old female with pneumonia and left pleural effusion. EXAM: ULTRASOUND GUIDED DIAGNOSTIC AND THERAPEUTIC LEFT THORACENTESIS MEDICATIONS: 10 mL 1% lidocaine COMPLICATIONS: None immediate. PROCEDURE: An ultrasound guided thoracentesis was thoroughly discussed with the patient and questions answered. The benefits, risks, alternatives and complications were also discussed. The patient understands and wishes to proceed with the procedure. Written consent was obtained. Ultrasound was performed to localize and mark an adequate pocket of fluid in the LEFT chest. The area was then prepped and draped in the normal sterile fashion. 1% Lidocaine was used for local anesthesia.  Under ultrasound guidance Nikyah Lackman 6 Fr Safe-T-Centesis catheter was introduced.  Thoracentesis was performed. The catheter was removed and Amya Hlad dressing applied. FINDINGS: Caswell Alvillar total of approximately 370 mL of serosanguineous fluid was removed. Samples were sent to the laboratory as requested by the clinical team. IMPRESSION: Successful ultrasound guided diagnostic and therapeutic LEFT thoracentesis yielding 370 mL of pleural fluid. Roanna Banning, MD [facility] Electronically Signed   By: Roanna Banning M.D.   On: 04/26/2023 14:30   DG Chest 2 View Result Date: 04/25/2023 CLINICAL DATA:  Shortness of breath and flu-like symptoms EXAM: CHEST - 2 VIEW COMPARISON:  Chest radiograph dated 04/19/2023 FINDINGS: Lines/tubes: Right chest wall port tip projects over the SVC. Lungs: Patient is rotated to the right. Hazy left lower lung opacity. Pleura: No pneumothorax.  Layering large left pleural effusion. Heart/mediastinum: Left heart border is obscured. Bones: Compression deformities of the thoracic spine with vertebral augmentation. IMPRESSION: Layering large left pleural effusion with associated left lower lung atelectasis. Electronically Signed   By: Agustin Cree M.D.   On: 04/25/2023 17:48         Scheduled Meds:  amiodarone  200 mg Oral Daily   aspirin EC  81 mg Oral Daily   atorvastatin  20 mg Oral QPM   clotrimazole  10 mg Oral 5 X Daily   vitamin B-12  1,000 mcg Oral Daily   docusate sodium  100 mg Oral BID   enoxaparin (LOVENOX) injection  30 mg Subcutaneous Q24H   lactose free nutrition  237 mL Oral BID BM   magnesium oxide  400 mg Oral Daily   Continuous Infusions:     LOS: 7 days    Time spent: over 30 min    Lacretia Nicks, MD [facility]   To contact the attending provider between 7A-7P or the covering provider during after hours 7P-7A, please log into the web site [email or web address] and access using universal [place] password for that web site. If you do not have the password, please call the  hospital operator.  04/26/2023, 4:39 PM

## 2023-04-27 ENCOUNTER — Inpatient Hospital Stay (HOSPITAL_COMMUNITY): Payer: 59

## 2023-04-27 ENCOUNTER — Inpatient Hospital Stay: Payer: 59

## 2023-04-27 DIAGNOSIS — J189 Pneumonia, unspecified organism: Secondary | ICD-10-CM | POA: Diagnosis not present

## 2023-04-27 LAB — MAGNESIUM: Magnesium: 1.9 mg/dL (ref 1.7–2.4)

## 2023-04-27 LAB — COMPREHENSIVE METABOLIC PANEL
ALT: 26 U/L (ref 0–44)
AST: 38 U/L (ref 15–41)
Albumin: 2.9 g/dL — ABNORMAL LOW (ref 3.5–5.0)
Alkaline Phosphatase: 100 U/L (ref 38–126)
Anion gap: 15 (ref 5–15)
BUN: 13 mg/dL (ref 8–23)
CO2: 29 mmol/L (ref 22–32)
Calcium: 8.8 mg/dL — ABNORMAL LOW (ref 8.9–10.3)
Chloride: 93 mmol/L — ABNORMAL LOW (ref 98–111)
Creatinine, Ser: 1.11 mg/dL — ABNORMAL HIGH (ref 0.44–1.00)
GFR, Estimated: 52 mL/min — ABNORMAL LOW (ref >60)
Glucose, Bld: 108 mg/dL — ABNORMAL HIGH (ref 70–99)
Potassium: 4.5 mmol/L (ref 3.5–5.1)
Sodium: 137 mmol/L (ref 135–145)
Total Bilirubin: 0.7 mg/dL (ref 0.0–1.2)
Total Protein: 6.3 g/dL — ABNORMAL LOW (ref 6.5–8.1)

## 2023-04-27 LAB — CBC WITH DIFFERENTIAL/PLATELET
Abs Immature Granulocytes: 0.03 10*3/uL (ref 0.00–0.07)
Basophils Absolute: 0 10*3/uL (ref 0.0–0.1)
Basophils Relative: 0 %
Eosinophils Absolute: 0 10*3/uL (ref 0.0–0.5)
Eosinophils Relative: 0 %
HCT: 31.2 % — ABNORMAL LOW (ref 36.0–46.0)
Hemoglobin: 9.3 g/dL — ABNORMAL LOW (ref 12.0–15.0)
Immature Granulocytes: 1 %
Lymphocytes Relative: 10 %
Lymphs Abs: 0.6 10*3/uL — ABNORMAL LOW (ref 0.7–4.0)
MCH: 32.5 pg (ref 26.0–34.0)
MCHC: 29.8 g/dL — ABNORMAL LOW (ref 30.0–36.0)
MCV: 109.1 fL — ABNORMAL HIGH (ref 80.0–100.0)
Monocytes Absolute: 0.8 10*3/uL (ref 0.1–1.0)
Monocytes Relative: 13 %
Neutro Abs: 4.9 10*3/uL (ref 1.7–7.7)
Neutrophils Relative %: 76 %
Platelets: 168 10*3/uL (ref 150–400)
RBC: 2.86 MIL/uL — ABNORMAL LOW (ref 3.87–5.11)
RDW: 15.1 % (ref 11.5–15.5)
WBC: 6.4 10*3/uL (ref 4.0–10.5)
nRBC: 0 % (ref 0.0–0.2)

## 2023-04-27 LAB — MISC LABCORP TEST (SEND OUT): LabCorp test name: 11254

## 2023-04-27 LAB — PH, BODY FLUID: pH, Body Fluid: 7.4

## 2023-04-27 LAB — PHOSPHORUS: Phosphorus: 4.4 mg/dL (ref 2.5–4.6)

## 2023-04-27 MED ORDER — ENSURE ENLIVE PO LIQD
237.0000 mL | Freq: Two times a day (BID) | ORAL | Status: DC
  Administered 2023-04-27 – 2023-04-29 (×3): 237 mL via ORAL

## 2023-04-27 MED ORDER — FUROSEMIDE 10 MG/ML IJ SOLN: 40.0000 mg | Freq: Every day | INTRAMUSCULAR | Status: DC

## 2023-04-27 NOTE — Progress Notes (Signed)
 PROGRESS NOTE    Sheena [name]  XBJ:[id number] DOB: 07-11-1945 DOA: 04/18/2023 PCP: [name], MD  No chief complaint on file.   Brief Narrative:   Sheena [name] is Sheena [name] 78 y.o. female with PMH SCLC (dx 06/2022) s/p chemoradiation (declined prophylactic cranial irradiation). Also PMH PAF no AC, HTN, breast cancer.  She had recent hospitalization from 03/31/2023 until 04/06/2023 due to right hip fracture and underwent surgical repair.  She was discharged to rehab and has had difficulty with recovery and worsening weakness/debility.  She was brought back to the hospital due to respiratory symptoms and found to be hypoxic at [facility] requiring 10 L nonrebreather initially.  Workup showed worsening left-sided pleural effusion and new right sided pleural effusion.  She underwent left-sided thoracentesis x2.  Fluid was transudative - cytology is pending.  She's had Blythe Hartshorn course of abx for pneumonia.  She's gradually improved, but still isn't participating well with therapy and has poor PO intake.   Likely discharge in the next few days after palliative eval and if stable.   Assessment & Plan:   Principal Problem:   HCAP (healthcare-associated pneumonia) Active Problems:   Primary small cell carcinoma of lower lobe of left lung (HCC)   Pleural effusion   Dysarthria   Physical deconditioning   Closed intertrochanteric fracture of hip, right, initial encounter (HCC) - s/p ORIF 04-02-2023 with IM nail   Anxiety   Essential hypertension, benign   Hyperlipidemia   PAF (paroxysmal atrial fibrillation) (HCC) - not on systemic anticoagulation due to hx of GI bleeding   Macrocytic anemia   Sepsis with acute hypoxic respiratory failure without septic shock (HCC)  Poor PO intake  Failure To Thrive - recently seems like she's been doing poorly - not taking great PO, hasn't been able to participate with therapy as much  --- discussed with daughter my biggest concern is her poor PO  intake and lack of mobility/participation with therapy - I think discussion with palliative important to start discussion of what Sheena [name] would want if she were to continue to decline. - she's now on Jaquese Irving regular diet -> still taking poor PO - will c/s RD - appreciate palliative eval as well  Physical deconditioning - daughter notes she hasn't walked since before her fracture - I agree this is concerning for her rehab potential  - s/p IM nailing of right femur fx on 04/02/23 -palliative care consult; oncology also consulted --appreciate ongoing therapy eval  Transudative Pleural effusion - Seen previously on prior CT chest at least from November 2024; has now worsened - No prior thoracentesis has been performed; concern would be malignant effusion - Patient underwent thoracentesis on 04/19/2023, 400 cc removed -- repeat thoracentesis 2/24, 370 cc removed -- labs c/w transudate -> adding on protein to 2/24 fluid - Follow-up thoracentesis cytology pending -- continue lasix as tolerated (hold today, creatinine starting to trend up) -- follow CXR 2/25   Pneumonia - per CTA chest: "Inspissated material within the right middle and lower lobe bronchial tree with collapse of both lobes identified." -Cell count unimpressive on thoracentesis.  No serum LDH collected.  Appears fluid is likely transudate based on pleural fluid protein.  - for now will continue abx, de-escalate to unasyn (s/p 7 days abx) - follow up SLP eval -> regular/thin  Closed intertrochanteric fracture of hip, right, initial encounter (HCC) - s/p ORIF 04-02-2023 with IM nail - continue with PT/OT - had been at [facility] since discharge  - Okay  for oxycodone for pain control along with Tylenol as needed - Morphine for breakthrough pain -- plain films of T/L spine with her c/o back pain - follow imaging of L hip (unchanged alignment of proximal femoral intertrochanteric fracture status post cephalomedullary nail fixation) and knee  (tiny effusion)  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 Arbutus Ped  - seen by oncology 2/20  - patient already DNR; await further workup; if no considerable improvement then may need further GOC discussions with palliative care - MRI brain without intracranial metastatic disease - currently on surveillance  - follow up cytology (pending) from thoracentesis   Dysarthria - seems resolved -Repeat MRI brain without intracranial metastatic disease   Thrush - clotrimazole troche   Macrocytic anemia - low/normal B12 234 on 04/01/23 - continue supplementation    PAF (paroxysmal atrial fibrillation) (HCC) - not on systemic anticoagulation due to hx of GI bleeding - Not on anticoagulation - Rate controlled; continue amiodarone - metoprolol on hold   Hyperlipidemia - Continue Lipitor although likely no further mortality benefit at this time - discuss deprescribing with outpatient pcp - can consider during this hospitalization   Essential hypertension, benign -BP appropriate, not take amlodipine per med rec - metoprolol on hold   Anxiety - Currently on no controlled substances     DVT prophylaxis: lovenox Code Status: DNR Family Communication: daughter 2/24 Disposition:   Status is: Inpatient Remains inpatient appropriate because: need for continued inpatient care   Consultants:  pulmonology  Procedures:  Echo IMPRESSIONS     1. Left ventricular ejection fraction, by estimation, is 65 to 70%. The  left ventricle has normal function. The left ventricle has no regional  wall motion abnormalities. Left ventricular diastolic parameters are  indeterminate. Elevated left atrial  pressure.   2. Right ventricular systolic function is hyperdynamic. The right  ventricular size is normal. Tricuspid regurgitation signal is inadequate  for assessing PA pressure.   3. The mitral valve is normal in structure. No evidence of mitral  valve  regurgitation. No evidence of mitral stenosis.   4. The aortic valve is tricuspid. Aortic valve regurgitation is trivial.  No aortic stenosis is present.   5. The inferior vena cava is dilated in size with <50% respiratory  variability, suggesting right atrial pressure of 15 mmHg.   Comparison(s): Prior images reviewed side by side. IVC more dilated.   Antimicrobials:  Anti-infectives (From admission, onward)    Start     Dose/Rate Route Frequency Ordered Stop   04/20/23 1215  Ampicillin-Sulbactam (UNASYN) 3 g in sodium chloride 0.9 % 100 mL IVPB        3 g 200 mL/hr over 30 Minutes Intravenous Every 6 hours 04/20/23 1126 04/24/23 2024   04/19/23 1000  piperacillin-tazobactam (ZOSYN) IVPB 3.375 g  Status:  Discontinued        3.375 g 12.5 mL/hr over 240 Minutes Intravenous Every 8 hours 04/19/23 0859 04/20/23 1125   04/19/23 0900  linezolid (ZYVOX) IVPB 600 mg  Status:  Discontinued        600 mg 300 mL/hr over 60 Minutes Intravenous Every 12 hours 04/19/23 0849 04/20/23 1125   04/19/23 0345  piperacillin-tazobactam (ZOSYN) IVPB 3.375 g        3.375 g 100 mL/hr over 30 Minutes Intravenous  Once 04/19/23 0330 04/19/23 0414   04/18/23 2200  cefTRIAXone (ROCEPHIN) 2 g in sodium chloride 0.9 % 100 mL IVPB  2 g 200 mL/hr over 30 Minutes Intravenous Once 04/18/23 2148 04/18/23 2234   04/18/23 2200  azithromycin (ZITHROMAX) 500 mg in sodium chloride 0.9 % 250 mL IVPB        500 mg 250 mL/hr over 60 Minutes Intravenous  Once 04/18/23 2148 04/19/23 0027       Subjective: Denies complaint  Objective: Vitals:   04/26/23 1946 04/27/23 0356 04/27/23 0357 04/27/23 1339  BP: 111/76 (!) 120/50  (!) 103/51  Pulse: 88 92  76  Resp: 18 18  16   Temp: 98.5 F (36.9 C) (!) 97.5 F (36.4 C)  97.7 F (36.5 C)  TempSrc: Oral Oral  Oral  SpO2: 93% 90%  95%  Weight:   41.1 kg   Height:        Intake/Output Summary (Last 24 hours) at 04/27/2023 1520 Last data filed at 04/27/2023  0981 Gross per 24 hour  Intake --  Output 300 ml  Net -300 ml   Filed Weights   04/25/23 0456 04/26/23 0623 04/27/23 0357  Weight: 41.9 kg 39.5 kg 41.1 kg    Examination:  General: frail Cardiovascular: RRR Lungs: diminished Abdomen: Soft, nontender, nondistended Neurological: Alert . Moves all extremities 4 with equal strength. Cranial nerves II through XII grossly intact. Extremities: No clubbing or cyanosis. No edema.   Data Reviewed: I have personally reviewed following labs and imaging studies  CBC: Recent Labs  Lab 04/22/23 0357 04/23/23 0406 04/24/23 0434 04/25/23 0347 04/27/23 0630  WBC 6.2 4.8 5.5 5.7 6.4  NEUTROABS 4.7 3.3 3.8 4.2 4.9  HGB 9.3* 9.5* 9.2* 8.8* 9.3*  HCT 29.9* 31.4* 30.1* 29.1* 31.2*  MCV 107.6* 109.4* 109.1* 109.0* 109.1*  PLT 187 176 163 135* 168    Basic Metabolic Panel: Recent Labs  Lab 04/22/23 0357 04/23/23 0406 04/24/23 0434 04/25/23 0347 04/27/23 0634  NA 135 135 136 136 137  K 3.8 3.6 3.7 3.3* 4.5  CL 94* 93* 92* 95* 93*  CO2 32 31 34* 32 29  GLUCOSE 94 85 101* 93 108*  BUN 9 8 7* 6* 13  CREATININE 0.67 0.69 0.78 0.90 1.11*  CALCIUM 8.3* 8.4* 8.2* 8.2* 8.8*  MG 1.8 1.9 1.9 2.0 1.9  PHOS  --   --   --   --  4.4    GFR: Estimated Creatinine Clearance: 28.4 mL/min (Saiquan Hands) (by C-G formula based on SCr of 1.11 mg/dL (H)).  Liver Function Tests: Recent Labs  Lab 04/27/23 0634  AST 38  ALT 26  ALKPHOS 100  BILITOT 0.7  PROT 6.3*  ALBUMIN 2.9*     CBG: No results for input(s): "GLUCAP" in the last 168 hours.   Recent Results (from the past 240 hours)  Blood Culture (routine x 2)     Status: None   Collection Time: 04/18/23  1:22 AM   Specimen: BLOOD  Result Value Ref Range Status   Specimen Description   Final    BLOOD LEFT ANTECUBITAL Performed at [facility], [address] [place], [place] [address]    Special Requests   Final    BOTTLES DRAWN AEROBIC AND ANAEROBIC Blood Culture results may not  be optimal due to an inadequate volume of blood received in culture bottles Performed at [facility], [address] [place], [place] [address]    Culture   Final    NO GROWTH 5 DAYS Performed at [facility], [address] [place], [place] [address]  Report Status 04/24/2023 FINAL  Final  Blood Culture (routine x 2)     Status: None   Collection Time: 04/18/23  9:46 PM   Specimen: BLOOD LEFT ARM  Result Value Ref Range Status   Specimen Description   Final    BLOOD LEFT ARM Performed at [facility], [address] [place], [place] [address]    Special Requests   Final    BOTTLES DRAWN AEROBIC AND ANAEROBIC Blood Culture adequate volume Performed at [facility], [address], [place], [place] [address]    Culture   Final    NO GROWTH 5 DAYS Performed at [facility], [address], [place], [place] [address]    Report Status 04/23/2023 FINAL  Final  Resp panel by RT-PCR (RSV, Flu Priseis Cratty&B, Covid) Anterior Nasal Swab     Status: None   Collection Time: 04/18/23  9:48 PM   Specimen: Anterior Nasal Swab  Result Value Ref Range Status   SARS Coronavirus 2 by RT PCR NEGATIVE NEGATIVE Final    Comment: (NOTE) SARS-CoV-2 target nucleic acids are NOT DETECTED.  The SARS-CoV-2 RNA is generally detectable in upper respiratory specimens during the acute phase of infection. The lowest concentration of SARS-CoV-2 viral copies this assay can detect is 138 copies/mL. Pria Klosinski negative result does not preclude SARS-Cov-2 infection and should not be used as the sole basis for treatment or other patient management decisions. Nari Vannatter negative result may occur with  improper specimen collection/handling, submission of specimen other than nasopharyngeal swab, presence of viral mutation(s) within the areas targeted by this assay, and inadequate number of viral copies(<138 copies/mL). Jinnie Onley negative result must be combined with clinical  observations, patient history, and epidemiological information. The expected result is Negative.  Fact Sheet for Patients:  BloggerCourse.com  Fact Sheet for Healthcare Providers:  SeriousBroker.it  This test is no t yet approved or cleared by the [place] FDA and  has been authorized for detection and/or diagnosis of SARS-CoV-2 by FDA under an Emergency Use Authorization (EUA). This EUA will remain  in effect (meaning this test can be used) for the duration of the COVID-19 declaration under Section 564(b)(1) of the Act, 21 U.S.C.section 360bbb-3(b)(1), unless the authorization is terminated  or revoked sooner.       Influenza Desiray Orchard by PCR NEGATIVE NEGATIVE Final   Influenza B by PCR NEGATIVE NEGATIVE Final    Comment: (NOTE) The Xpert Xpress SARS-CoV-2/FLU/RSV plus assay is intended as an aid in the diagnosis of influenza from Nasopharyngeal swab specimens and should not be used as Reonna Finlayson sole basis for treatment. Nasal washings and aspirates are unacceptable for Xpert Xpress SARS-CoV-2/FLU/RSV testing.  Fact Sheet for Patients: BloggerCourse.com  Fact Sheet for Healthcare Providers: SeriousBroker.it  This test is not yet approved or cleared by the [place] FDA and has been authorized for detection and/or diagnosis of SARS-CoV-2 by FDA under an Emergency Use Authorization (EUA). This EUA will remain in effect (meaning this test can be used) for the duration of the COVID-19 declaration under Section 564(b)(1) of the Act, 21 U.S.C. section 360bbb-3(b)(1), unless the authorization is terminated or revoked.     Resp Syncytial Virus by PCR NEGATIVE NEGATIVE Final    Comment: (NOTE) Fact Sheet for Patients: BloggerCourse.com  Fact Sheet for Healthcare Providers: SeriousBroker.it  This test is not yet approved or cleared by  the [place] FDA and has been authorized for detection and/or diagnosis of SARS-CoV-2 by FDA under an Emergency  Use Authorization (EUA). This EUA will remain in effect (meaning this test can be used) for the duration of the COVID-19 declaration under Section 564(b)(1) of the Act, 21 U.S.C. section 360bbb-3(b)(1), unless the authorization is terminated or revoked.  Performed at [facility], [address], [place], [place] [address]   Body fluid culture w Gram Stain     Status: None   Collection Time: 04/19/23  3:29 PM   Specimen: PATH Cytology Pleural fluid  Result Value Ref Range Status   Specimen Description   Final    PLEURAL Performed at [facility], [address], [place], [place] [address]    Special Requests   Final    NONE Performed at [facility], [address] [place], [place] [address]    Gram Stain NO WBC SEEN NO ORGANISMS SEEN   Final   Culture   Final    NO GROWTH 3 DAYS Performed at [facility], [address] [place], [place] [address]    Report Status 04/23/2023 FINAL  Final  MRSA Next Gen by PCR, Nasal     Status: None   Collection Time: 04/19/23  4:50 PM   Specimen: Nasal Mucosa; Nasal Swab  Result Value Ref Range Status   MRSA by PCR Next Gen NOT DETECTED NOT DETECTED Final    Comment: (NOTE) The GeneXpert MRSA Assay (FDA approved for NASAL specimens only), is one component of Neamiah Sciarra comprehensive MRSA colonization surveillance program. It is not intended to diagnose MRSA infection nor to guide or monitor treatment for MRSA infections. Test performance is not FDA approved in patients less than 31 years old. Performed at [facility], [address], [place], [place] [address]   Body fluid culture w Gram Stain     Status: None (Preliminary result)   Collection Time: 04/26/23 12:01 PM   Specimen: PATH Cytology Pleural fluid  Result Value Ref Range Status    Specimen Description   Final    PLEURAL Performed at [facility], [address], [place], [place] [address]    Special Requests   Final    NONE Performed at [facility], [address] [place], [place] [address]    Gram Stain NO WBC SEEN NO ORGANISMS SEEN   Final   Culture   Final    NO GROWTH < 24 HOURS Performed at [facility], [address] [place], [place] [address]    Report Status PENDING  Incomplete         Radiology Studies: DG Chest 1 View Result Date: 04/26/2023 CLINICAL DATA:  [id number] S/P thoracentesis [id number] EXAM: PORTABLE CHEST 1 VIEW COMPARISON:  Chest XR, 04/25/2023 and 04/19/2023. CT chest, 04/19/2023. FINDINGS: Support lines: RIGHT chest port with catheter tip within the SVC. Cardiac silhouette is within normal limits. Aortic arch atherosclerosis. Hyperinflation with relative flattening of the diaphragm. The RIGHT lung is clear. Improved aeration of the LEFT lung post thoracentesis with no pneumothorax or residual pleural effusion. Trace patchy retrocardiac opacity. Thoracolumbar vertebral augmentation change. No interval osseous abnormality. IMPRESSION: 1. Improved aeration of the LEFT lung post thoracentesis. No pneumothorax or residual pleural effusion. 2. Aortic Atherosclerosis (ICD10-I70.0) and Emphysema (ICD10-J43.9). Electronically Signed   By: Roanna Banning M.D.   On: 04/26/2023 14:34   US THORACENTESIS ASP PLEURAL SPACE W/IMG GUIDE Result Date: 04/26/2023 INDICATION: 78 year old female with pneumonia and left pleural effusion. EXAM: ULTRASOUND GUIDED DIAGNOSTIC AND THERAPEUTIC  LEFT THORACENTESIS MEDICATIONS: 10 mL 1% lidocaine COMPLICATIONS: None immediate. PROCEDURE: An ultrasound guided thoracentesis was thoroughly discussed with the patient and questions answered. The benefits, risks, alternatives and complications were also discussed. The patient understands and wishes to proceed with the procedure. Written  consent was obtained. Ultrasound was performed to localize and mark an adequate pocket of fluid in the LEFT chest. The area was then prepped and draped in the normal sterile fashion. 1% Lidocaine was used for local anesthesia. Under ultrasound guidance Wilbur Labuda 6 Fr Safe-T-Centesis catheter was introduced. Thoracentesis was performed. The catheter was removed and Emilyanne Mcgough dressing applied. FINDINGS: Ashutosh Dieguez total of approximately 370 mL of serosanguineous fluid was removed. Samples were sent to the laboratory as requested by the clinical team. IMPRESSION: Successful ultrasound guided diagnostic and therapeutic LEFT thoracentesis yielding 370 mL of pleural fluid. Roanna Banning, MD [facility] Electronically Signed   By: Roanna Banning M.D.   On: 04/26/2023 14:30   DG Chest 2 View Result Date: 04/25/2023 CLINICAL DATA:  Shortness of breath and flu-like symptoms EXAM: CHEST - 2 VIEW COMPARISON:  Chest radiograph dated 04/19/2023 FINDINGS: Lines/tubes: Right chest wall port tip projects over the SVC. Lungs: Patient is rotated to the right. Hazy left lower lung opacity. Pleura: No pneumothorax.  Layering large left pleural effusion. Heart/mediastinum: Left heart border is obscured. Bones: Compression deformities of the thoracic spine with vertebral augmentation. IMPRESSION: Layering large left pleural effusion with associated left lower lung atelectasis. Electronically Signed   By: Agustin Cree M.D.   On: 04/25/2023 17:48         Scheduled Meds:  amiodarone  200 mg Oral Daily   aspirin EC  81 mg Oral Daily   atorvastatin  20 mg Oral QPM   clotrimazole  10 mg Oral 5 X Daily   vitamin B-12  1,000 mcg Oral Daily   docusate sodium  100 mg Oral BID   enoxaparin (LOVENOX) injection  30 mg Subcutaneous Q24H   feeding supplement  237 mL Oral BID BM   magnesium oxide  400 mg Oral Daily   Continuous Infusions:     LOS: 8 days    Time spent: over 30 min    Lacretia Nicks, MD [facility]   To contact the attending provider between 7A-7P or the covering provider during after hours 7P-7A, please log into the web site [email or web address] and access using universal [place] password for that web site. If you do not have the password, please call the hospital operator.  04/27/2023, 3:20 PM

## 2023-04-27 NOTE — Progress Notes (Addendum)
 Physical Therapy Treatment Patient Details Name: Arisha Gervais [facility] MRN: [id number] DOB: Oct 13, 1945 Today's Date: 04/27/2023   History of Present Illness Patient is a 78 yo female who was admitted on  04/18/2023 due to progressive SOB. Pt was found to have B pleural effusion, atelectasis and requiring 6 L/min supplemental O2 and underwent thoracentesis on 2/17 with 400 mL yellow fluid. Pt dx with acute on chronic hypoxic respiratory failure due to CPAP. Pt has PMH including but not limited to: HTN, anxiety, LLL small cell carcinoma, scarlet fever, PAF, GI bleed, chronic LBP, fall resulting in R femur fx s/p IM nail (04/02/2023), HLD, L1 compression fx and kyphoplasty.    PT Comments  PT - Cognition Comments: Pt AxO x 2 quick to say "no" but can be encouraged to get OOB.  Pt very fearful.  Requires Max encourement.  Pleasant.  Poor PO intake.  Pt admits "nothing is good". "Not interested" in anything.  Pt appears fragile.  Pt required 4lts oxygen to achieve sats >90%.   Assisted OOB was difficult.  General bed mobility comments: Required Max Assist + 2 using bed pad to "craddle" suppport her to EOB with Max encouragment and positive reinforcement.  Pt presents with fear and anxiety but can be redirected. General transfer comment: Pt required + 2 Max Assist side by side for full from elevated bed onto B Platform Fara [place]os. General Gait Details: Pt required + 2 Max Asisst along with B Platform EVA walker to barely amb 16 feet with poor self ability to support hert weight and great difficulty advancing either LE.  Full support through walker and recliner closely following behind.  Poor posture of flexed hips and knees.  HIGH FALL RISK. Positioned in recliner with multiple pillows. Pt will need ST Rehab at SNF to address mobility and functional decline prior to safely returning home.    If plan is discharge home, recommend the following: Assistance with cooking/housework;Assist for transportation;Help with  stairs or ramp for entrance;A lot of help with walking and/or transfers;A lot of help with bathing/dressing/bathroom   Can travel by private vehicle     No  Equipment Recommendations  None recommended by PT    Recommendations for Other Services       Precautions / Restrictions Precautions Precautions: Fall Precaution/Restrictions Comments: monitor sats Restrictions Weight Bearing Restrictions Per Provider Order: No RLE Weight Bearing Per Provider Order: Weight bearing as tolerated Other Position/Activity Restrictions: per ortho notes form 1/31     Mobility  Bed Mobility Overal bed mobility: Needs Assistance Bed Mobility: Supine to Sit     Supine to sit: Max assist     General bed mobility comments: Required Max Assist + 2 using bed pad to "craddle" suppport her to EOB with Max encouragment and positive reinforcement.  Pt presents with fear and anxiety but can be redirected.    Transfers Overall transfer level: Needs assistance Equipment used: Bilateral platform walker, 2 person hand held assist Transfers: Sit to/from Stand Sit to Stand: Max assist, +2 physical assistance, +2 safety/equipment, From elevated surface           General transfer comment: Pt required + 2 Max Assist side by side for full from elevated bed onto B Platform Fara [place].    Ambulation/Gait Ambulation/Gait assistance: Max assist, +2 physical assistance, +2 safety/equipment Gait Distance (Feet): 16 Feet Assistive device: Bilateral platform walker Gait Pattern/deviations: Step-to pattern, Decreased stance time - right, Drifts right/left, Trunk flexed, Narrow base of support, Knees buckling, Knee flexed  in stance - right Gait velocity: decreased     General Gait Details: Pt required + 2 Max Asisst along with B Platform EVA walker to barely amb 16 feet with poor self ability to support hert weight and great difficulty advancing either LE.  Full support through walker and recliner closely  following behind.  Poor posture of flexed hips and knees.  HIGH FALL RISK.   Stairs             Wheelchair Mobility     Tilt Bed    Modified Rankin (Stroke Patients Only)       Balance                                            Communication Communication Communication: No apparent difficulties  Cognition Arousal: Alert Behavior During Therapy: Anxious, WFL for tasks assessed/performed                           PT - Cognition Comments: Pt AxO x 2 quick to say "no" but can be encouraged to get OOB.  Pt very fearful.  Requires Max encourement.  Pleasant.  Poor PO intake.  Pt admits "nothing is good". Following commands: Impaired Following commands impaired: Follows one step commands with increased time    Cueing Cueing Techniques: Verbal cues, Tactile cues  Exercises      General Comments        Pertinent Vitals/Pain Pain Assessment Pain Assessment: Faces Faces Pain Scale: Hurts a little bit Pain Location: hip with movement Pain Descriptors / Indicators: Discomfort, Grimacing, Guarding Pain Intervention(s): Monitored during session, Repositioned    Home Living                          Prior Function            PT Goals (current goals can now be found in the care plan section) Progress towards PT goals: Progressing toward goals    Frequency    Min 1X/week      PT Plan      Co-evaluation              AM-PAC PT "6 Clicks" Mobility   Outcome Measure  Help needed turning from your back to your side while in a flat bed without using bedrails?: Total Help needed moving from lying on your back to sitting on the side of a flat bed without using bedrails?: Total Help needed moving to and from a bed to a chair (including a wheelchair)?: Total Help needed standing up from a chair using your arms (e.g., wheelchair or bedside chair)?: Total Help needed to walk in hospital room?: Total Help needed climbing  3-5 steps with a railing? : Total 6 Click Score: 6    End of Session Equipment Utilized During Treatment: Gait belt Activity Tolerance: Patient limited by fatigue Patient left: in chair;with call bell/phone within reach;with chair alarm set Nurse Communication: Mobility status PT Visit Diagnosis: History of falling (Z91.81);Difficulty in walking, not elsewhere classified (R26.2);Pain;Unsteadiness on feet (R26.81) Pain - Right/Left: Right Pain - part of body: Leg     Time: 1102-1130 PT Time Calculation (min) (ACUTE ONLY): 28 min  Charges:    $Gait Training: 8-22 mins $Therapeutic Activity: 8-22 mins PT General Charges $$ ACUTE PT VISIT: 1 Visit  Felecia Shelling  PTA Acute  Rehabilitation Services Office M-F          [phone]

## 2023-04-27 NOTE — TOC Progression Note (Signed)
 Transition of Care [facility]) - Progression Note    Patient Details  Name: Sheena [name] MRN: [id number] Date of Birth: January 05, 1946  Transition of Care [facility]) CM/SW Contact  Larrie Kass, LCSW Phone Number: 04/27/2023, 4:41 PM  Clinical Narrative:     Pt's insurance auth for [name] was resubmitted and is pending. TOC to follow.   Expected Discharge Plan: Skilled Nursing Facility Barriers to Discharge: Insurance Authorization  Expected Discharge Plan and Services                                               Social Determinants of Health (SDOH) Interventions SDOH Screenings   Food Insecurity: No Food Insecurity (04/19/2023)  Housing: Low Risk  (04/19/2023)  Transportation Needs: No Transportation Needs (04/19/2023)  Utilities: Not At Risk (04/19/2023)  Depression (PHQ2-9): Low Risk  (03/17/2023)  Recent Concern: Depression (PHQ2-9) - Medium Risk (02/03/2023)  Financial Resource Strain: Low Risk  (03/17/2023)  Physical Activity: Inactive (03/17/2023)  Social Connections: Socially Isolated (04/19/2023)  Stress: No Stress Concern Present (03/17/2023)  Tobacco Use: Medium Risk (04/18/2023)  Health Literacy: Adequate Health Literacy (03/17/2023)    Readmission Risk Interventions    10/09/2022    2:31 PM  Readmission Risk Prevention Plan  Transportation Screening Complete  PCP or Specialist Appt within 3-5 Days Complete  HRI or Home Care Consult Complete  Social Work Consult for Recovery Care Planning/Counseling Complete  Palliative Care Screening Not Applicable  Medication Review [employer]) Complete

## 2023-04-27 NOTE — Progress Notes (Signed)
 Initial Nutrition Assessment  DOCUMENTATION CODES:   Severe malnutrition in context of chronic illness  INTERVENTION:  - Regular diet.  - Ensure Plus High Protein po BID, each supplement provides 350 kcal and 20 grams of protein. - Magic cup BID with meals, each supplement provides 290 kcal and 9 grams of protein - Multivitamin with minerals daily. - Monitor weight trends.   NUTRITION DIAGNOSIS:   Severe Malnutrition related to chronic illness (SCLC) as evidenced by severe fat depletion, severe muscle depletion, percent weight loss (17% in 10 months).  GOAL:   Patient will meet greater than or equal to 90% of their needs  MONITOR:   PO intake, Supplement acceptance, Weight trends  REASON FOR ASSESSMENT:   Consult Assessment of nutrition requirement/status  ASSESSMENT:   78 y.o. female with PMH SCLC (dx 06/2022) s/p chemoradiation, HTN, breast cancer. Recently hospitalized from 03/31/2023 to 04/06/2023 for hip fracture and underwent surgical repair.  Discharged to rehab but has had difficulty with recovery and worsening weakness/debility so was brought back to the hospital due to respiratory symptoms. Admitted for PNA and pleural effusion   Patient reports a UBW of 100# and weight loss since cancer diagnosis.  Per EMR, patient dropped from 105# in March 2024 to 87# by January 2025. This is a 18# or 17% weight loss in 10 months, which is significant for the time frame. Weight appears stable since that time.    Patient endorses eating 2 meals a day PTA (breakfsat and dinner) but notes they are very small. No nutrition supplements at home. Appetite has been very poor.   Current appetite remains decreased and patient feels she is eating around the same amount she was PTA. Documented to be consuming 0-50% of meals. She was ordered Boost Plus but does not like it. Likes Ensure better so agreeable to receive this and try Magic Cup during admission as well. Discussed increased nutrient  needs and encouraged intake of 3 meals a day in addition to nutrition supplements.   Medications reviewed and include: 1000 mcg vitamin B12, Colace  Labs reviewed:  No BMP since 2/23  NUTRITION - FOCUSED PHYSICAL EXAM:  Flowsheet Row Most Recent Value  Orbital Region Severe depletion  Upper Arm Region Severe depletion  Thoracic and Lumbar Region Unable to assess  Buccal Region Moderate depletion  Temple Region Severe depletion  Clavicle Bone Region Severe depletion  Clavicle and Acromion Bone Region Severe depletion  Scapular Bone Region Unable to assess  Dorsal Hand Moderate depletion  Patellar Region Severe depletion  Anterior Thigh Region Severe depletion  Posterior Calf Region Severe depletion  Edema (RD Assessment) None  Hair Reviewed  Eyes Reviewed  Mouth Reviewed  Skin Reviewed  Nails Reviewed       Diet Order:   Diet Order             Diet regular Room service appropriate? Yes; Fluid consistency: Thin  Diet effective now                   EDUCATION NEEDS:  Education needs have been addressed  Skin:  Skin Assessment: Reviewed RN Assessment  Last BM:  2/24 - type 4  Height:  Ht Readings from Last 1 Encounters:  04/19/23 5' (1.524 m)   Weight:  Wt Readings from Last 1 Encounters:  04/27/23 41.1 kg    BMI:  Body mass index is 17.7 kg/m.  Estimated Nutritional Needs:  Kcal:  1400-1600 kcals Protein:  70-80 grams Fluid:  >/=  1.4L    Shelle Iron RD, LDN Contact via Secure Chat.

## 2023-04-28 DIAGNOSIS — J189 Pneumonia, unspecified organism: Secondary | ICD-10-CM | POA: Diagnosis not present

## 2023-04-28 DIAGNOSIS — E43 Unspecified severe protein-calorie malnutrition: Secondary | ICD-10-CM | POA: Insufficient documentation

## 2023-04-28 LAB — CBC WITH DIFFERENTIAL/PLATELET
Abs Immature Granulocytes: 0.03 10*3/uL (ref 0.00–0.07)
Basophils Absolute: 0 10*3/uL (ref 0.0–0.1)
Basophils Relative: 0 %
Eosinophils Absolute: 0 10*3/uL (ref 0.0–0.5)
Eosinophils Relative: 0 %
HCT: 31 % — ABNORMAL LOW (ref 36.0–46.0)
Hemoglobin: 9.4 g/dL — ABNORMAL LOW (ref 12.0–15.0)
Immature Granulocytes: 0 %
Lymphocytes Relative: 22 %
Lymphs Abs: 1.5 10*3/uL (ref 0.7–4.0)
MCH: 32.3 pg (ref 26.0–34.0)
MCHC: 30.3 g/dL (ref 30.0–36.0)
MCV: 106.5 fL — ABNORMAL HIGH (ref 80.0–100.0)
Monocytes Absolute: 0.9 10*3/uL (ref 0.1–1.0)
Monocytes Relative: 13 %
Neutro Abs: 4.5 10*3/uL (ref 1.7–7.7)
Neutrophils Relative %: 65 %
Platelets: 165 10*3/uL (ref 150–400)
RBC: 2.91 MIL/uL — ABNORMAL LOW (ref 3.87–5.11)
RDW: 15 % (ref 11.5–15.5)
WBC: 7 10*3/uL (ref 4.0–10.5)
nRBC: 0 % (ref 0.0–0.2)

## 2023-04-28 LAB — CYTOLOGY - NON PAP

## 2023-04-28 LAB — COMPREHENSIVE METABOLIC PANEL
ALT: 32 U/L (ref 0–44)
AST: 53 U/L — ABNORMAL HIGH (ref 15–41)
Albumin: 2.9 g/dL — ABNORMAL LOW (ref 3.5–5.0)
Alkaline Phosphatase: 104 U/L (ref 38–126)
Anion gap: 12 (ref 5–15)
BUN: 20 mg/dL (ref 8–23)
CO2: 30 mmol/L (ref 22–32)
Calcium: 8.8 mg/dL — ABNORMAL LOW (ref 8.9–10.3)
Chloride: 91 mmol/L — ABNORMAL LOW (ref 98–111)
Creatinine, Ser: 1.2 mg/dL — ABNORMAL HIGH (ref 0.44–1.00)
GFR, Estimated: 47 mL/min — ABNORMAL LOW (ref >60)
Glucose, Bld: 117 mg/dL — ABNORMAL HIGH (ref 70–99)
Potassium: 3.9 mmol/L (ref 3.5–5.1)
Sodium: 133 mmol/L — ABNORMAL LOW (ref 135–145)
Total Bilirubin: 0.7 mg/dL (ref 0.0–1.2)
Total Protein: 6.1 g/dL — ABNORMAL LOW (ref 6.5–8.1)

## 2023-04-28 LAB — PHOSPHORUS: Phosphorus: 4.2 mg/dL (ref 2.5–4.6)

## 2023-04-28 LAB — MAGNESIUM: Magnesium: 2.1 mg/dL (ref 1.7–2.4)

## 2023-04-28 MED ORDER — CARMEX CLASSIC LIP BALM EX OINT
TOPICAL_OINTMENT | CUTANEOUS | Status: DC | PRN
  Filled 2023-04-28: qty 10

## 2023-04-28 MED ORDER — LOPERAMIDE HCL 2 MG PO CAPS
2.0000 mg | ORAL_CAPSULE | ORAL | Status: DC | PRN
  Administered 2023-04-28 – 2023-04-29 (×3): 2 mg via ORAL
  Filled 2023-04-28 (×3): qty 1

## 2023-04-28 NOTE — Evaluation (Signed)
 Clinical/Bedside Swallow Evaluation Patient Details  Name: Sheena [name] [facility] MRN: [id number] Date of Birth: 03-25-1945  Today's Date: 04/28/2023 Time: SLP Start Time (ACUTE ONLY): 1010 SLP Stop Time (ACUTE ONLY): 1026 SLP Time Calculation (min) (ACUTE ONLY): 16 min  Past Medical History:  Past Medical History:  Diagnosis Date   Anxiety    Heart murmur    Hypertension    Primary small cell carcinoma of lower lobe of left lung (HCC)    Scarlet fever with other complications childhood   "had to learn to work again" has had leg weakness   Past Surgical History:  Past Surgical History:  Procedure Laterality Date   BRONCHIAL NEEDLE ASPIRATION BIOPSY  06/30/2022   Procedure: BRONCHIAL NEEDLE ASPIRATION BIOPSIES;  Surgeon: Sheena Igo, DO;  Location: [facility] END[facility]COPY;  Service: Pulmonary;;   INTRAMEDULLARY (IM) NAIL INTERTROCHANTERIC Right 04/02/2023   Procedure: RIGHT INTRAMEDULLARY (IM) NAIL INTERTROCHANTERIC;  Surgeon: Sheena Lofts, MD;  Location: [facility] OR;  Service: Orthopedics;  Laterality: Right;   IR IMAGING GUIDED PORT INSERTION  07/31/2022   IR KYPHO LUMBAR INC FX REDUCE BONE BX UNI/BIL CANNULATION INC/IMAGING  12/17/2022   IR US GUIDE VASC ACCESS LEFT  12/17/2022   KYPHOPLASTY     2014   peridontal surgery     VIDEO BRONCHOSCOPY WITH ENDOBRONCHIAL ULTRASOUND N/A 06/30/2022   Procedure: VIDEO BRONCHOSCOPY WITH ENDOBRONCHIAL ULTRASOUND;  Surgeon: Sheena Igo, DO;  Location: [facility] ENDOSCOPY;  Service: Pulmonary;  Laterality: N/A;   HPI:  Patient is a 78 y.o. female with PMH: HTN, paroxysmal a-fib, breast cancer, recent hospital stay at [facility] in January of 2025 for hip fracture. She presented to hospital on 04/18/23 from SNF [facility]) with acute on chronic hypoxic respiratory failure due to healthcare acquired PNA. At baseline she is on 2L supplemental oxygen via [place]. She was found to be at 85% SpO2 at her facility and was placed on 10L NRB. CT angio/chest/PE showed left sided  pleural effusion with associated left lower lobe consolidation, right lung demonstrates inspissated material within the right lower lobe bronchial tree with right lower lobe collapse with radiologist reporting it may represent a large mucous plug or possible aspirated material. Swallow was evaluated on 2/20 (recs for dysphagia 3/thin) and 2/24 with recs for regular solids/thin liquids.  She was made NPO after observed to be coughing and another swallow eval was ordered 2/26.    Assessment / Plan / Recommendation  Clinical Impression  Pt presents with no indications of an oropharyngeal dysphagia.  Continues to present with mild left facial asymmetry; dentition are present.  She was alert and communicative, making needs known.  Voice strong; cough weak.  She has had poor PO intake per chart review. Today she was willing to drink/eat a bit. Demonstrated active mastication, no oral residue post-swallow, no s/s of aspiration.  Swallowing mechanism was challenged with solids/liquids simultaneously and she appeared to protect her airway well. There are no overt s/s of dysphagia.  Recommend resuming a regular diet/thin liquids; meds per pt's preferences (either with liquid or puree).  Occasional coughing with PO intake should not cause alarm.  No further SLP f/u is warranted. Our service will sign off. SLP Visit Diagnosis: Dysphagia, unspecified (R13.10)       Diet Recommendation   Thin;Age appropriate regular  Medication Administration: Whole meds with liquid    Other  Recommendations Oral Care Recommendations: Oral care BID;Patient independent with oral care    Recommendations for follow up therapy are one component  of a multi-disciplinary discharge planning process, led by the attending physician.  Recommendations may be updated based on patient status, additional functional criteria and insurance authorization.  Follow up Recommendations No SLP follow up        Swallow Study   General HPI: Patient  is a 78 y.o. female with PMH: HTN, paroxysmal a-fib, breast cancer, recent hospital stay at [facility] in January of 2025 for hip fracture. She presented to hospital on 04/18/23 from SNF [facility]) with acute on chronic hypoxic respiratory failure due to healthcare acquired PNA. At baseline she is on 2L supplemental oxygen via [place]. She was found to be at 85% SpO2 at her facility and was placed on 10L NRB. CT angio/chest/PE showed left sided pleural effusion with associated left lower lobe consolidation, right lung demonstrates inspissated material within the right lower lobe bronchial tree with right lower lobe collapse with radiologist reporting it may represent a large mucous plug or possible aspirated material. Swallow was evaluated on 2/20 (recs for dysphagia 3/thin) and 2/24 with recs for regular solids/thin liquids.  She was made NPO after observed to be coughing and another swallow eval was ordered 2/26. Type of Study: Bedside Swallow Evaluation Previous Swallow Assessment: 04/22/2023 and 04/26/2023 Diet Prior to this Study: NPO Temperature Spikes Noted: No Respiratory Status: Nasal cannula History of Recent Intubation: No Behavior/Cognition: Alert Oral Cavity Assessment: Within Functional Limits Oral Care Completed by SLP: No Oral Cavity - Dentition: Adequate natural dentition Vision: Functional for self-feeding Self-Feeding Abilities: Able to feed self Patient Positioning: Upright in bed Baseline Vocal Quality: Normal Volitional Cough: Weak Volitional Swallow: Able to elicit    Oral/Motor/Sensory Function Overall Oral Motor/Sensory Function: Mild impairment Facial Symmetry: Abnormal symmetry left;Suspected CN VII (facial) dysfunction   Ice Chips Ice chips: Within functional limits   Thin Liquid Thin Liquid: Within functional limits    Nectar Thick Nectar Thick Liquid: Not tested   Honey Thick Honey Thick Liquid: Not tested   Puree Puree: Within functional limits   Solid     Solid:  Within functional limits      Sheena [name] 04/28/2023,10:41 AM  Marchelle Folks L. Samson Frederic, MA CCC/SLP Clinical Specialist - Acute Care SLP Acute Rehabilitation Services Office number [phone]

## 2023-04-28 NOTE — Progress Notes (Signed)
 PROGRESS NOTE    Sheena [name] [facility]  ZOX:[id number] DOB: [birth year] DOA: 04/18/2023 PCP: [name], MD    Brief Narrative:   Sheena [name] is a 78 y.o. female with past medical history significant for squamous cell lung cancer (Dx 06/2022) s/p chemoradiation and declined prophylactic cranial irradiation, chronic respiratory failure on 2 L nasal cannula at baseline, paroxysmal atrial fibrillation not on anticoagulation, essential hypertension, breast cancer, recent hospitalization at [facility] for right hip fracture who presented to [facility] ED on 04/18/2023 via ambulance from [facility] with cough, lethargy, chills. On EMS arrival, patient's O2 saturation was 85% and placed on 10 L nonrebreather.  Afebrile.  Patient transported to ED for further evaluation management.  In the ED, temperature 97.9 F, HR 68, RR 20, BP 100/60, SpO2 100% on 5 L nasal cannula.  WBC 6.2, hemoglobin 10.2, platelet count 188.  Sodium 138, potassium 4.8, chloride 98, CO2 29, glucose 117, BUN 22, creatinine 0.90.  AST 35, ALT 23, total bilirubin 0.7.  Lactic acid 0.7.  COVID/influenza/RSV PCR negative.  Urinalysis unrevealing.  Chest x-ray with right middle/lower lobe collapse with elevation the right hemidiaphragm, COPD, suspected left pleural effusion.  Patient was started on IV antibiotics.  TRH consulted for admission for acute on chronic respiratory failure with concern of community-acquired pneumonia, left pleural effusion.  Assessment & Plan:   Acute on chronic hypoxic respiratory failure Patient presenting with acute shortness of breath, found to be hypoxic on baseline 2 L nasal cannula with SpO2 85% and placed on nonrebreather by EMS.  Workup in the ED, patient afebrile without leukocytosis.  COVID/influenza/RSV PCR negative, chest x-ray with right middle/lower lobe collapse with elevation hemidiaphragm suspected left pleural effusion.  Initially concern for possible mucous  plugging or foreign body given right-sided atelectasis, seen by PCCM, Dr. Judeth Horn on 04/19/2023 with recommendation of thoracentesis, IV antibiotics and no further invasive testing at this time. --Continue supplemental oxygen, maintain SpO2 greater than 88%, on 2 L nasal cannula baseline -- Continue treatment as below  Left pleural effusion Imaging notable for left sided pleural effusion.  IR was consulted and patient underwent thoracentesis on 04/18/2022 with 400 mL yellow fluid removed. -- Cytology: Pending -- repeat chest x-ray in a.m.  Community-acquired pneumonia Completed 7-day course of IV antibiotics. --Continue supplemental oxygen, maintain SpO2 greater than 88%; 2L [place] at basline  Squamous cell lung cancer Follows with medical oncology outpatient, Dr. Shirline Frees.  Diagnosed April 2024.  Underwent chemotherapy with carboplatin and etoposide x 4 cycles with concurrent radiation therapy.  Patient declined prophylactic cranial irradiation.  MRI brain --Outpatient follow medical oncology  Anemia of chronic medical disease -- Hemoglobin 9.4, stable. -- Transfuse for hemoglobin less than 7.0  Recent closed intertrochanter fracture right hip Patient underwent ORIF with IM and on 04/02/2023. -- Tylenol as needed mild pain -- Oxycodone 5 mg p.o. every 4 hours as needed severe pain -- Morphine 2 mg IV every 4 hours as needed severe breakthrough pain not relieved by oxycodone -- Robaxin 500 mg p.o. every 6 hours.  Muscle spasms --Will resume aspirin 81 mg p.o. daily for plan 30-day treatment for postoperative DVT prophylaxis per orthopedics -- Outpatient follow-up with orthopedics  Paroxysmal atrial fibrillation Not on systemic anticoagulation due to history of GI bleeding. -- Amiodarone 200 mg p.o. daily  Hyperlipidemia -- Atorvastatin 20 mg p.o. daily  Essential hypertension Currently not on antihypertensives outpatient.  Adult failure to thrive Severe protein calorie  malnutrition Body mass index  is 17.27 kg/m. Nutrition Status: Nutrition Problem: Severe Malnutrition Etiology: chronic illness (SCLC) Signs/Symptoms: severe fat depletion, severe muscle depletion, percent weight loss (17% in 10 months) Percent weight loss: 17 % (in 10 months) Interventions: Refer to RD note for recommendations, Ensure Enlive (each supplement provides 350kcal and 20 grams of protein), MVI -- Dietitian following, continue encourage increased oral intake -- Palliative care consulted for assistance with goals of care/medical decision making -- Long discussion with son at bedside today regarding overall long-term poor prognosis, he will discuss with his sister regarding possible feeding tube but appears to be considering a more palliative/hospice approach.  DVT prophylaxis: enoxaparin (LOVENOX) injection 30 mg Start: 04/19/23 0815 SCDs Start: 04/19/23 0806    Code Status: Limited: Do not attempt resuscitation (DNR) -DNR-LIMITED -Do Not Intubate/DNI  Family Communication: No family present at bedside this morning  Disposition Plan:  Level of care: Progressive Status is: Inpatient Remains inpatient appropriate because: Pending repeat chest x-ray in the morning, ensure stability on 2 L nasal cannula, anticipate possible discharge back to SNF tomorrow.    Consultants:  Orthopedics Pulmonary critical care medicine: Signed off 04/19/2023 Interventional radiology  Procedures:  Thoracentesis, IR 2/17  Antimicrobials:  Zosyn 2/16 - 2/18 Linezolid 2/17 - 2/18 Azithromycin 2/16 - 2/16 Ceftriaxone 2/16 - 2/16 Unasyn 2/18 - 2/22   Subjective: Seen examined bedside, lying in bed.  Son present at bedside.  Remains with poor oral intake.  Discussed with son regarding her failure to thrive and likely had a point where we need to decide if organ to continue aggressive measures such as feeding tube placement versus a more palliative/comfort approach.  He will discuss with his sister  with plan to have decision tomorrow morning.  Patient with no other complaints, oxygen now titrated back down to 2 L nasal cannula which is at her baseline.  Patient states "I want to stay in the hospital".  Discussed with her this is not a viable option.  No other specific complaints, concerns or questions at this time.  Denies headache, no chest pain, no palpitations, no abdominal pain, no fever/chills/night sweats, no nausea/vomiting/diarrhea, no focal weakness, no fatigue, no paresthesia.  No acute events overnight per nurse staff.  Objective: Vitals:   04/28/23 0629 04/28/23 1002 04/28/23 1235 04/28/23 1734  BP: (!) 117/57 (!) 110/54 (!) 111/56 (!) 114/48  Pulse: 87 80 74 88  Resp: 17 (!) 22 18 18   Temp: (!) 97.3 F (36.3 C) (!) 97.3 F (36.3 C) 98 F (36.7 C) 98.5 F (36.9 C)  TempSrc: Oral Oral Oral Oral  SpO2: 98% 94% 94% 95%  Weight:      Height:        Intake/Output Summary (Last 24 hours) at 04/28/2023 1835 Last data filed at 04/28/2023 1731 Gross per 24 hour  Intake 60 ml  Output 250 ml  Net -190 ml   Filed Weights   04/26/23 0623 04/27/23 0357 04/28/23 0500  Weight: 39.5 kg 41.1 kg 40.1 kg    Examination:  Physical Exam: GEN: NAD, alert and oriented x 3, chronically ill/cachectic in appearance HEENT: NCAT, PERRL, EOMI, sclera clear, MMM PULM: Breath sounds slightly diminished bilateral bases, no wheezing/crackles, normal respiratory effort without accessory muscle use, on 2 L nasal cannula. CV: RRR w/o M/G/R GI: abd soft, NTND, NABS, no R/G/M MSK: no peripheral edema, moves all extremity dependently, noted surgical dressing in place, clean/dry/intact without surrounding fluctuance/erythema NEURO: CN II-XII intact, no focal deficits, sensation to light touch intact PSYCH: normal  mood/affect Integumentary: Focal slight right lower extremity noted as above, otherwise no other concerning rashes/lesions/wounds noted on exposed skin surfaces.    Data Reviewed: I  have personally reviewed following labs and imaging studies  CBC: Recent Labs  Lab 04/23/23 0406 04/24/23 0434 04/25/23 0347 04/27/23 0630 04/28/23 0424  WBC 4.8 5.5 5.7 6.4 7.0  NEUTROABS 3.3 3.8 4.2 4.9 4.5  HGB 9.5* 9.2* 8.8* 9.3* 9.4*  HCT 31.4* 30.1* 29.1* 31.2* 31.0*  MCV 109.4* 109.1* 109.0* 109.1* 106.5*  PLT 176 163 135* 168 165   Basic Metabolic Panel: Recent Labs  Lab 04/23/23 0406 04/24/23 0434 04/25/23 0347 04/27/23 0634 04/28/23 0424  NA 135 136 136 137 133*  K 3.6 3.7 3.3* 4.5 3.9  CL 93* 92* 95* 93* 91*  CO2 31 34* 32 29 30  GLUCOSE 85 101* 93 108* 117*  BUN 8 7* 6* 13 20  CREATININE 0.69 0.78 0.90 1.11* 1.20*  CALCIUM 8.4* 8.2* 8.2* 8.8* 8.8*  MG 1.9 1.9 2.0 1.9 2.1  PHOS  --   --   --  4.4 4.2   GFR: Estimated Creatinine Clearance: 25.6 mL/min (A) (by C-G formula based on SCr of 1.2 mg/dL (H)). Liver Function Tests: Recent Labs  Lab 04/27/23 0634 04/28/23 0424  AST 38 53*  ALT 26 32  ALKPHOS 100 104  BILITOT 0.7 0.7  PROT 6.3* 6.1*  ALBUMIN 2.9* 2.9*   No results for input(s): "LIPASE", "AMYLASE" in the last 168 hours. No results for input(s): "AMMONIA" in the last 168 hours. Coagulation Profile: No results for input(s): "INR", "PROTIME" in the last 168 hours. Cardiac Enzymes: No results for input(s): "CKTOTAL", "CKMB", "CKMBINDEX", "TROPONINI" in the last 168 hours. BNP (last 3 results) No results for input(s): "PROBNP" in the last 8760 hours. HbA1C: No results for input(s): "HGBA1C" in the last 72 hours. CBG: No results for input(s): "GLUCAP" in the last 168 hours. Lipid Profile: No results for input(s): "CHOL", "HDL", "LDLCALC", "TRIG", "CHOLHDL", "LDLDIRECT" in the last 72 hours. Thyroid Function Tests: No results for input(s): "TSH", "T4TOTAL", "FREET4", "T3FREE", "THYROIDAB" in the last 72 hours. Anemia Panel: No results for input(s): "VITAMINB12", "FOLATE", "FERRITIN", "TIBC", "IRON", "RETICCTPCT" in the last 72  hours. Sepsis Labs: No results for input(s): "PROCALCITON", "LATICACIDVEN" in the last 168 hours.  Recent Results (from the past 240 hours)  Blood Culture (routine x 2)     Status: None   Collection Time: 04/18/23  9:46 PM   Specimen: BLOOD LEFT ARM  Result Value Ref Range Status   Specimen Description   Final    BLOOD LEFT ARM Performed at [facility], [address] [place], [place] [address]    Special Requests   Final    BOTTLES DRAWN AEROBIC AND ANAEROBIC Blood Culture adequate volume Performed at [facility], [address], [place], [place] [address]    Culture   Final    NO GROWTH 5 DAYS Performed at [facility], [address] [place], [place] [address]    Report Status 04/23/2023 FINAL  Final  Resp panel by RT-PCR (RSV, Flu A&B, Covid) Anterior Nasal Swab     Status: None   Collection Time: 04/18/23  9:48 PM   Specimen: Anterior Nasal Swab  Result Value Ref Range Status   SARS Coronavirus 2 by RT PCR NEGATIVE NEGATIVE Final    Comment: (NOTE) SARS-CoV-2 target nucleic acids are NOT DETECTED.  The SARS-CoV-2 RNA is generally detectable in upper respiratory  specimens during the acute phase of infection. The lowest concentration of SARS-CoV-2 viral copies this assay can detect is 138 copies/mL. A negative result does not preclude SARS-Cov-2 infection and should not be used as the sole basis for treatment or other patient management decisions. A negative result may occur with  improper specimen collection/handling, submission of specimen other than nasopharyngeal swab, presence of viral mutation(s) within the areas targeted by this assay, and inadequate number of viral copies(<138 copies/mL). A negative result must be combined with clinical observations, patient history, and epidemiological information. The expected result is Negative.  Fact Sheet for Patients:  BloggerCourse.com  Fact Sheet  for Healthcare Providers:  SeriousBroker.it  This test is no t yet approved or cleared by the [place] FDA and  has been authorized for detection and/or diagnosis of SARS-CoV-2 by FDA under an Emergency Use Authorization (EUA). This EUA will remain  in effect (meaning this test can be used) for the duration of the COVID-19 declaration under Section 564(b)(1) of the Act, 21 U.S.C.section 360bbb-3(b)(1), unless the authorization is terminated  or revoked sooner.       Influenza A by PCR NEGATIVE NEGATIVE Final   Influenza B by PCR NEGATIVE NEGATIVE Final    Comment: (NOTE) The Xpert Xpress SARS-CoV-2/FLU/RSV plus assay is intended as an aid in the diagnosis of influenza from Nasopharyngeal swab specimens and should not be used as a sole basis for treatment. Nasal washings and aspirates are unacceptable for Xpert Xpress SARS-CoV-2/FLU/RSV testing.  Fact Sheet for Patients: BloggerCourse.com  Fact Sheet for Healthcare Providers: SeriousBroker.it  This test is not yet approved or cleared by the [place] FDA and has been authorized for detection and/or diagnosis of SARS-CoV-2 by FDA under an Emergency Use Authorization (EUA). This EUA will remain in effect (meaning this test can be used) for the duration of the COVID-19 declaration under Section 564(b)(1) of the Act, 21 U.S.C. section 360bbb-3(b)(1), unless the authorization is terminated or revoked.     Resp Syncytial Virus by PCR NEGATIVE NEGATIVE Final    Comment: (NOTE) Fact Sheet for Patients: BloggerCourse.com  Fact Sheet for Healthcare Providers: SeriousBroker.it  This test is not yet approved or cleared by the [place] FDA and has been authorized for detection and/or diagnosis of SARS-CoV-2 by FDA under an Emergency Use Authorization (EUA). This EUA will remain in effect (meaning  this test can be used) for the duration of the COVID-19 declaration under Section 564(b)(1) of the Act, 21 U.S.C. section 360bbb-3(b)(1), unless the authorization is terminated or revoked.  Performed at [facility], [address] [place], [place] [address]   Body fluid culture w Gram Stain     Status: None   Collection Time: 04/19/23  3:29 PM   Specimen: PATH Cytology Pleural fluid  Result Value Ref Range Status   Specimen Description   Final    PLEURAL Performed at [facility], [address], [place], [place] [address]    Special Requests   Final    NONE Performed at [facility], [address] [place], [place] [address]    Gram Stain NO WBC SEEN NO ORGANISMS SEEN   Final   Culture   Final    NO GROWTH 3 DAYS Performed at [facility], [address] [place], [place] [address]    Report Status 04/23/2023 FINAL  Final  MRSA Next Gen by PCR, Nasal     Status: None   Collection Time: 04/19/23  4:50 PM   Specimen: Nasal Mucosa; Nasal Swab  Result Value Ref Range Status   MRSA by PCR Next Gen NOT DETECTED NOT DETECTED Final    Comment: (NOTE) The GeneXpert MRSA Assay (FDA approved for NASAL specimens only), is one component of a comprehensive MRSA colonization surveillance program. It is not intended to diagnose MRSA infection nor to guide or monitor treatment for MRSA infections. Test performance is not FDA approved in patients less than 30 years old. Performed at [facility], [address], [place], [place] [address]   Body fluid culture w Gram Stain     Status: None (Preliminary result)   Collection Time: 04/26/23 12:01 PM   Specimen: PATH Cytology Pleural fluid  Result Value Ref Range Status   Specimen Description   Final    PLEURAL Performed at [facility], [address], [place], [place] [address]    Special Requests   Final    NONE Performed at [facility], [address] [place], [place] [address]    Gram Stain NO WBC SEEN NO ORGANISMS SEEN   Final   Culture   Final    NO GROWTH 2 DAYS Performed at [facility], [address] [place], [place] [address]    Report Status PENDING  Incomplete         Radiology Studies: DG Lumbar Spine 2-3 Views Result Date: 04/27/2023 CLINICAL DATA:  Shortness of breath with back pain and limited range of motion. EXAM: THORACIC SPINE 2 VIEWS; LUMBAR SPINE - 2-3 VIEW COMPARISON:  Chest CTA 04/19/2023, CT cervical spine 03/31/2023, CT thoracic spine 03/07/2022 and CT lumbar spine 12/09/2022. FINDINGS: Thoracic spine: The bones are diffusely demineralized. Patient is status post spinal augmentation at T12 and L1. There are chronic compression fractures at T5, T8 and T11 which are grossly unchanged. No definite new fractures identified. Lumbar spine: As above, previous spinal augmentation at L1. Grossly stable superior endplate compression deformities at L2, L3 and L5, suboptimally evaluated due to obliquity on the lateral view and underlying osteopenia. No definite new fractures identified. Previous proximal right femoral ORIF. Diffuse aortoiliac atherosclerosis. IMPRESSION: 1. No definite acute osseous findings identified in the thoracic or lumbar spine. Evaluation limited by osseous demineralization and rotation. 2. Multiple chronic compression fractures in the thoracic and lumbar spine as described. Previous spinal augmentation at T12 and L1. Electronically Signed   By: Carey Bullocks M.D.   On: 04/27/2023 16:44   DG Thoracic Spine 2 View Result Date: 04/27/2023 CLINICAL DATA:  Shortness of breath with back pain and limited range of motion. EXAM: THORACIC SPINE 2 VIEWS; LUMBAR SPINE - 2-3 VIEW COMPARISON:  Chest CTA 04/19/2023, CT cervical spine 03/31/2023, CT thoracic spine 03/07/2022 and CT lumbar spine 12/09/2022. FINDINGS: Thoracic spine: The bones are diffusely demineralized.  Patient is status post spinal augmentation at T12 and L1. There are chronic compression fractures at T5, T8 and T11 which are grossly unchanged. No definite new fractures identified. Lumbar spine: As above, previous spinal augmentation at L1. Grossly stable superior endplate compression deformities at L2, L3 and L5, suboptimally evaluated due to obliquity on the lateral view and underlying osteopenia. No definite new fractures identified. Previous proximal right femoral ORIF. Diffuse aortoiliac atherosclerosis. IMPRESSION: 1. No definite acute osseous findings identified in the thoracic or lumbar spine. Evaluation limited by osseous demineralization and rotation. 2. Multiple chronic compression fractures in the thoracic and lumbar spine as described. Previous spinal augmentation at T12  and L1. Electronically Signed   By: Carey Bullocks M.D.   On: 04/27/2023 16:44   DG Chest 2 View Result Date: 04/27/2023 CLINICAL DATA:  Back pain and shortness of breath. Patient has stiffness EXAM: CHEST - 2 VIEW COMPARISON:  X-ray 04/26/2023. FINDINGS: Hyperinflation. Osteopenia. Right IJ chest port with tip along the central SVC. Bilateral small pleural effusions with adjacent opacities. Left-greater-than-right. No pneumothorax or edema. Normal cardiopericardial silhouette. Film is rotated to the right. There is some bandlike changes right perihilar, likely scar or atelectasis versus thickening along the minor fissure. Degenerative changes of the spine. Multilevel compression deformities with areas of augmentation cement as on prior. IMPRESSION: Rotated radiograph. Bilateral small pleural effusions with adjacent opacities, left-greater-than-right. Recommend follow-up. Right IJ chest port. Compression deformities along the spine with osteopenia. Augmentation cement at 2 levels of the lower thoracic spine. Please correlate with history Electronically Signed   By: Karen Kays M.D.   On: 04/27/2023 16:36        Scheduled  Meds:  amiodarone  200 mg Oral Daily   aspirin EC  81 mg Oral Daily   atorvastatin  20 mg Oral QPM   clotrimazole  10 mg Oral 5 X Daily   vitamin B-12  1,000 mcg Oral Daily   docusate sodium  100 mg Oral BID   enoxaparin (LOVENOX) injection  30 mg Subcutaneous Q24H   feeding supplement  237 mL Oral BID BM   magnesium oxide  400 mg Oral Daily   Continuous Infusions:   LOS: 9 days    Time spent: 53 minutes spent on chart review, discussion with nursing staff, consultants, updating family and interview/physical exam; more than 50% of that time was spent in counseling and/or coordination of care.    Alvira Philips [place], DO [facility] Available via Epic secure chat 7am-7pm After these hours, please refer to coverage provider listed on [email or web address] 04/28/2023, 6:35 PM

## 2023-04-28 NOTE — Progress Notes (Signed)
   04/28/23 0405  Assess: MEWS Score  Temp 99.5 F (37.5 C)  BP 130/79  MAP (mmHg) 96  Pulse Rate (!) 127  Resp (!) 21  SpO2 92 %  O2 Device Nasal Cannula  O2 Flow Rate (L/min) 6 L/min  Assess: MEWS Score  MEWS Temp 0  MEWS Systolic 0  MEWS Pulse 2  MEWS RR 1  MEWS LOC 0  MEWS Score 3  MEWS Score Color Yellow  Assess: if the MEWS score is Yellow or Red  Were vital signs accurate and taken at a resting state? Yes  Does the patient meet 2 or more of the SIRS criteria? Yes  Does the patient have a confirmed or suspected source of infection? No  MEWS guidelines implemented  Yes, yellow  Treat  MEWS Interventions Considered administering scheduled or prn medications/treatments as ordered  Take Vital Signs  Increase Vital Sign Frequency  Yellow: Q2hr x1, continue Q4hrs until patient remains green for 12hrs  Escalate  MEWS: Escalate Yellow: Discuss with charge nurse and consider notifying provider and/or RRT  Notify: Charge Nurse/RN  Name of Charge Nurse/RN Notified Joanna, RN  Assess: SIRS CRITERIA  SIRS Temperature  0  SIRS Respirations  1  SIRS Pulse 1  SIRS WBC 0  SIRS Score Sum  2

## 2023-04-28 NOTE — Progress Notes (Signed)
 Occupational Therapy Treatment Patient Details Name: Sheena [name] [facility] MRN: [id number] DOB: September 02, 1945 Today's Date: 04/28/2023   History of present illness Patient is a 78 yo female who was admitted on  04/18/2023 due to progressive SOB. Pt was found to have B pleural effusion, atelectasis and requiring 6 L/min supplemental O2 and underwent thoracentesis on 2/17 with 400 mL yellow fluid. Pt dx with acute on chronic hypoxic respiratory failure due to CPAP. Pt has PMH including but not limited to: HTN, anxiety, LLL small cell carcinoma, scarlet fever, PAF, GI bleed, chronic LBP, fall resulting in R femur fx s/p IM nail (04/02/2023), HLD, L1 compression fx and kyphoplasty.   OT comments  Patient needed increased encouragement from therapist and family to engage in session. Patient was max A for ed mobility with self limiting tendencies. Patients rehab potential is guarded at this time. Patient would benefit from pallative.  Patient will benefit from continued inpatient follow up therapy, <3 hours/day. Patient's discharge plan remains appropriate at this time. OT will continue to follow acutely.        If plan is discharge home, recommend the following:  Two people to help with walking and/or transfers;Two people to help with bathing/dressing/bathroom;Assistance with cooking/housework;Assist for transportation;Help with stairs or ramp for entrance   Equipment Recommendations  BSC/3in1;Wheelchair (measurements OT);Wheelchair cushion (measurements OT)       Precautions / Restrictions Precautions Precautions: Fall Precaution/Restrictions Comments: monitor sats Restrictions Weight Bearing Restrictions Per Provider Order: No       Mobility Bed Mobility Overal bed mobility: Needs Assistance Bed Mobility: Supine to Sit, Sit to Supine     Supine to sit: Max assist Sit to supine: Max assist               Balance   Sitting-balance support: Bilateral upper extremity supported Sitting  balance-Leahy Scale: Poor Sitting balance - Comments: min A for sitting balance with posterior leaning noted.             ADL either performed or assessed with clinical judgement   ADL Overall ADL's : Needs assistance/impaired     Grooming: Minimal assistance;Sitting;Oral care Grooming Details (indicate cue type and reason): sitting EOB with min A for sitting balance.                               General ADL Comments: patient needed increased encouragement to engage in sitting EOB with patients inital reponse being no to tasks with encouragement needed for all things.      Cognition Arousal: Alert Behavior During Therapy: Flat affect Cognition: Cognition impaired             OT - Cognition Comments: patient was able to carry on conversations appropirately today.son was present and encouraging mother to participate.                 Following commands: Impaired Following commands impaired: Follows one step commands with increased time                    Pertinent Vitals/ Pain       Pain Assessment Pain Assessment: Faces Faces Pain Scale: Hurts a little bit Pain Location: hip with movement Pain Descriptors / Indicators: Discomfort, Grimacing, Guarding Pain Intervention(s): Limited activity within patient's tolerance, Monitored during session         Frequency  Min 1X/week        Progress Toward Goals  OT  Goals(current goals can now be found in the care plan section)  Progress towards OT goals: Not progressing toward goals - comment     Plan         AM-PAC OT "6 Clicks" Daily Activity     Outcome Measure   Help from another person eating meals?: A Little Help from another person taking care of personal grooming?: A Little Help from another person toileting, which includes using toliet, bedpan, or urinal?: Total Help from another person bathing (including washing, rinsing, drying)?: A Lot Help from another person to put on  and taking off regular upper body clothing?: A Lot Help from another person to put on and taking off regular lower body clothing?: A Lot 6 Click Score: 13    End of Session Equipment Utilized During Treatment: Oxygen  OT Visit Diagnosis: Unsteadiness on feet (R26.81);Muscle weakness (generalized) (M62.81);History of falling (Z91.81);Pain Pain - Right/Left: Right Pain - part of body: Hip;Leg;Knee   Activity Tolerance Patient limited by pain   Patient Left in bed;with call bell/phone within reach;with bed alarm set;with family/visitor present   Nurse Communication Mobility status        Time: 9604-5409 OT Time Calculation (min): 28 min  Charges: OT General Charges $OT Visit: 1 Visit OT Treatments $Self Care/Home Management : 23-37 mins  Rosalio Loud, MS Acute Rehabilitation Department Office# [phone]   Selinda Flavin 04/28/2023, 12:42 PM

## 2023-04-28 NOTE — TOC Progression Note (Signed)
 Transition of Care [facility]) - Progression Note    Patient Details  Name: Sheena [name] [facility] MRN: [id number] Date of Birth: Jun 20, 1945  Transition of Care [facility]) CM/SW Contact  Larrie Kass, LCSW Phone Number: 04/28/2023, 8:56 AM  Clinical Narrative:    Pt's insurance [employer] was approved for [place] for 04/27/23-04/29/23 , however pt is not medically stable . TOC to follow.    Expected Discharge Plan:  (TBD) Barriers to Discharge: Continued Medical Work up  Expected Discharge Plan and Services                                               Social Determinants of Health (SDOH) Interventions SDOH Screenings   Food Insecurity: No Food Insecurity (04/19/2023)  Housing: Low Risk  (04/19/2023)  Transportation Needs: No Transportation Needs (04/19/2023)  Utilities: Not At Risk (04/19/2023)  Depression (PHQ2-9): Low Risk  (03/17/2023)  Recent Concern: Depression (PHQ2-9) - Medium Risk (02/03/2023)  Financial Resource Strain: Low Risk  (03/17/2023)  Physical Activity: Inactive (03/17/2023)  Social Connections: Socially Isolated (04/19/2023)  Stress: No Stress Concern Present (03/17/2023)  Tobacco Use: Medium Risk (04/18/2023)  Health Literacy: Adequate Health Literacy (03/17/2023)    Readmission Risk Interventions    10/09/2022    2:31 PM  Readmission Risk Prevention Plan  Transportation Screening Complete  PCP or Specialist Appt within 3-5 Days Complete  HRI or Home Care Consult Complete  Social Work Consult for Recovery Care Planning/Counseling Complete  Palliative Care Screening Not Applicable  Medication Review Oceanographer) Complete

## 2023-04-29 ENCOUNTER — Inpatient Hospital Stay (HOSPITAL_COMMUNITY): Payer: 59

## 2023-04-29 DIAGNOSIS — N179 Acute kidney failure, unspecified: Secondary | ICD-10-CM | POA: Diagnosis not present

## 2023-04-29 DIAGNOSIS — Z743 Need for continuous supervision: Secondary | ICD-10-CM | POA: Diagnosis not present

## 2023-04-29 DIAGNOSIS — R5381 Other malaise: Secondary | ICD-10-CM | POA: Diagnosis not present

## 2023-04-29 DIAGNOSIS — C3490 Malignant neoplasm of unspecified part of unspecified bronchus or lung: Secondary | ICD-10-CM | POA: Diagnosis not present

## 2023-04-29 DIAGNOSIS — I69822 Dysarthria following other cerebrovascular disease: Secondary | ICD-10-CM | POA: Diagnosis not present

## 2023-04-29 DIAGNOSIS — M545 Low back pain, unspecified: Secondary | ICD-10-CM | POA: Diagnosis not present

## 2023-04-29 DIAGNOSIS — R627 Adult failure to thrive: Secondary | ICD-10-CM | POA: Diagnosis not present

## 2023-04-29 DIAGNOSIS — D62 Acute posthemorrhagic anemia: Secondary | ICD-10-CM | POA: Diagnosis not present

## 2023-04-29 DIAGNOSIS — J449 Chronic obstructive pulmonary disease, unspecified: Secondary | ICD-10-CM | POA: Diagnosis not present

## 2023-04-29 DIAGNOSIS — L449 Papulosquamous disorder, unspecified: Secondary | ICD-10-CM | POA: Diagnosis not present

## 2023-04-29 DIAGNOSIS — R6520 Severe sepsis without septic shock: Secondary | ICD-10-CM | POA: Diagnosis not present

## 2023-04-29 DIAGNOSIS — J9611 Chronic respiratory failure with hypoxia: Secondary | ICD-10-CM | POA: Diagnosis not present

## 2023-04-29 DIAGNOSIS — D539 Nutritional anemia, unspecified: Secondary | ICD-10-CM | POA: Diagnosis not present

## 2023-04-29 DIAGNOSIS — Z85118 Personal history of other malignant neoplasm of bronchus and lung: Secondary | ICD-10-CM | POA: Diagnosis not present

## 2023-04-29 DIAGNOSIS — G8929 Other chronic pain: Secondary | ICD-10-CM | POA: Diagnosis not present

## 2023-04-29 DIAGNOSIS — R471 Dysarthria and anarthria: Secondary | ICD-10-CM | POA: Diagnosis not present

## 2023-04-29 DIAGNOSIS — J439 Emphysema, unspecified: Secondary | ICD-10-CM | POA: Diagnosis not present

## 2023-04-29 DIAGNOSIS — J189 Pneumonia, unspecified organism: Secondary | ICD-10-CM | POA: Diagnosis not present

## 2023-04-29 DIAGNOSIS — J90 Pleural effusion, not elsewhere classified: Secondary | ICD-10-CM | POA: Diagnosis not present

## 2023-04-29 DIAGNOSIS — K5900 Constipation, unspecified: Secondary | ICD-10-CM | POA: Diagnosis not present

## 2023-04-29 DIAGNOSIS — Z87891 Personal history of nicotine dependence: Secondary | ICD-10-CM | POA: Diagnosis not present

## 2023-04-29 DIAGNOSIS — M6281 Muscle weakness (generalized): Secondary | ICD-10-CM | POA: Diagnosis not present

## 2023-04-29 DIAGNOSIS — I480 Paroxysmal atrial fibrillation: Secondary | ICD-10-CM | POA: Diagnosis not present

## 2023-04-29 DIAGNOSIS — R262 Difficulty in walking, not elsewhere classified: Secondary | ICD-10-CM | POA: Diagnosis not present

## 2023-04-29 DIAGNOSIS — I10 Essential (primary) hypertension: Secondary | ICD-10-CM | POA: Diagnosis not present

## 2023-04-29 DIAGNOSIS — Z7401 Bed confinement status: Secondary | ICD-10-CM | POA: Diagnosis not present

## 2023-04-29 DIAGNOSIS — Z515 Encounter for palliative care: Secondary | ICD-10-CM

## 2023-04-29 DIAGNOSIS — R918 Other nonspecific abnormal finding of lung field: Secondary | ICD-10-CM | POA: Diagnosis not present

## 2023-04-29 DIAGNOSIS — E785 Hyperlipidemia, unspecified: Secondary | ICD-10-CM | POA: Diagnosis not present

## 2023-04-29 DIAGNOSIS — R6889 Other general symptoms and signs: Secondary | ICD-10-CM | POA: Diagnosis not present

## 2023-04-29 DIAGNOSIS — T796XXD Traumatic ischemia of muscle, subsequent encounter: Secondary | ICD-10-CM | POA: Diagnosis not present

## 2023-04-29 DIAGNOSIS — S32009D Unspecified fracture of unspecified lumbar vertebra, subsequent encounter for fracture with routine healing: Secondary | ICD-10-CM | POA: Diagnosis not present

## 2023-04-29 DIAGNOSIS — E875 Hyperkalemia: Secondary | ICD-10-CM | POA: Diagnosis not present

## 2023-04-29 DIAGNOSIS — C3432 Malignant neoplasm of lower lobe, left bronchus or lung: Secondary | ICD-10-CM | POA: Diagnosis not present

## 2023-04-29 DIAGNOSIS — J9601 Acute respiratory failure with hypoxia: Secondary | ICD-10-CM | POA: Diagnosis not present

## 2023-04-29 DIAGNOSIS — R52 Pain, unspecified: Secondary | ICD-10-CM | POA: Diagnosis not present

## 2023-04-29 DIAGNOSIS — A419 Sepsis, unspecified organism: Secondary | ICD-10-CM | POA: Diagnosis not present

## 2023-04-29 DIAGNOSIS — S72141D Displaced intertrochanteric fracture of right femur, subsequent encounter for closed fracture with routine healing: Secondary | ICD-10-CM | POA: Diagnosis not present

## 2023-04-29 DIAGNOSIS — J984 Other disorders of lung: Secondary | ICD-10-CM | POA: Diagnosis not present

## 2023-04-29 DIAGNOSIS — E43 Unspecified severe protein-calorie malnutrition: Secondary | ICD-10-CM | POA: Diagnosis not present

## 2023-04-29 DIAGNOSIS — Z79899 Other long term (current) drug therapy: Secondary | ICD-10-CM | POA: Diagnosis not present

## 2023-04-29 MED ORDER — OXYCODONE HCL 5 MG PO TABS
5.0000 mg | ORAL_TABLET | ORAL | Status: DC | PRN
Start: 2023-04-29 — End: 2023-04-29
  Administered 2023-04-29: 5 mg via ORAL
  Filled 2023-04-29: qty 1

## 2023-04-29 MED ORDER — OXYCODONE HCL 5 MG PO TABS: 5.0000 mg | ORAL_TABLET | Freq: Four times a day (QID) | ORAL | 0 refills | Status: AC | PRN

## 2023-04-29 MED ORDER — FUROSEMIDE 20 MG PO TABS: 10.0000 mg | ORAL_TABLET | Freq: Every day | ORAL | Status: AC

## 2023-04-29 MED ORDER — PROCHLORPERAZINE EDISYLATE 10 MG/2ML IJ SOLN
10.0000 mg | Freq: Four times a day (QID) | INTRAMUSCULAR | Status: DC | PRN
  Administered 2023-04-29: 10 mg via INTRAVENOUS
  Filled 2023-04-29: qty 2

## 2023-04-29 MED ORDER — ACETAMINOPHEN 325 MG PO TABS: 650.0000 mg | ORAL_TABLET | Freq: Four times a day (QID) | ORAL | Status: AC | PRN

## 2023-04-29 MED ORDER — SODIUM CHLORIDE 0.9 % IV BOLUS
1000.0000 mL | Freq: Once | INTRAVENOUS | Status: AC
Start: 2023-04-29 — End: 2023-04-29
  Administered 2023-04-29: 1000 mL via INTRAVENOUS

## 2023-04-29 MED ORDER — FUROSEMIDE 20 MG PO TABS
10.0000 mg | ORAL_TABLET | Freq: Every day | ORAL | Status: DC
  Administered 2023-04-29: 10 mg via ORAL
  Filled 2023-04-29: qty 1

## 2023-04-29 MED ORDER — DOCUSATE SODIUM 100 MG PO CAPS: 100.0000 mg | ORAL_CAPSULE | Freq: Two times a day (BID) | ORAL | Status: AC | PRN

## 2023-04-29 NOTE — Care Management Important Message (Signed)
 Important Message  Patient Details IM Letter given. Name: Sheena [name] [facility] MRN: [id number] Date of Birth: 02-17-1946   Important Message Given:  Yes - Medicare IM     Caren Macadam 04/29/2023, 3:27 PM

## 2023-04-29 NOTE — Consult Note (Signed)
  Daily Progress Note   Patient Name: Sheena [name] [facility]       Date: 04/29/2023 DOB: Aug 24, 1945  Age: 78 y.o. MRN#: [id number] Attending Physician: [place], Eric J, DO Primary Care Physician: [name], MD Admit Date: 04/18/2023 Length of Stay: 10 days  Discussed care with primary hospitalist today. Patient is discharging today to rehab so unable to complete new PMT consult. Patient has been referred to outpatient palliative follow up with [facility]. Hospitalist has also engaged in GOC conversations with patient and family during hospital stay. Will cancel inpatient consult at this time. Thank you.   Sheena Morin, DO Palliative Care Provider PMT # [phone]  No Charge Note

## 2023-04-29 NOTE — TOC Transition Note (Signed)
 Transition of Care [facility]) - Discharge Note   Patient Details  Name: Sheena [name] [facility] MRN: [id number] Date of Birth: 1945-11-28  [facility] CM/SW Contact:  Larrie Kass, LCSW Phone Number: 04/29/2023, 11:14 AM   Clinical Narrative:     CSW spoke with both the pt's daughter, [name], and son, Onalee Hua, to discuss a consult for hospice services. CSW explained that the pt would not be able to discharge with hospice services if the family wants her to go to rehab. The pt's family would like short-term rehab. The pt will discharge to rehab with palliative follow-up. CSW discussed this with [name], who reported they work with [facility]. CSW made a referral to [facility] for outpatient palliative services.   Pt to d/c to [facility] for SNF placement, pt room number 218 , RN to call report to [phone]. [facility] called , TOC sign off.    Final next level of care: Skilled Nursing Facility Barriers to Discharge: Barriers Resolved   Patient Goals and CMS Choice Patient states their goals for this hospitalization and ongoing recovery are:: SNf to get stonger CMS Medicare.gov Compare Post Acute Care list provided to:: Patient Represenative (must comment) Choice offered to / list presented to : Adult Children      Discharge Placement              [facility] Patient to be transferred to facility by: [facility]   Patient and family notified of of transfer: 04/29/23  Discharge Plan and Services Additional resources added to the After Visit Summary for                                       Social Drivers of Health (SDOH) Interventions SDOH Screenings   Food Insecurity: No Food Insecurity (04/19/2023)  Housing: Low Risk  (04/19/2023)  Transportation Needs: No Transportation Needs (04/19/2023)  Utilities: Not At Risk (04/19/2023)  Depression (PHQ2-9): Low Risk  (03/17/2023)  Recent Concern: Depression (PHQ2-9) -  Medium Risk (02/03/2023)  Financial Resource Strain: Low Risk  (03/17/2023)  Physical Activity: Inactive (03/17/2023)  Social Connections: Socially Isolated (04/19/2023)  Stress: No Stress Concern Present (03/17/2023)  Tobacco Use: Medium Risk (04/18/2023)  Health Literacy: Adequate Health Literacy (03/17/2023)     Readmission Risk Interventions    10/09/2022    2:31 PM  Readmission Risk Prevention Plan  Transportation Screening Complete  PCP or Specialist Appt within 3-5 Days Complete  HRI or Home Care Consult Complete  Social Work Consult for Recovery Care Planning/Counseling Complete  Palliative Care Screening Not Applicable  Medication Review Oceanographer) Complete

## 2023-04-29 NOTE — Discharge Summary (Signed)
 Physician Discharge Summary  Sheena [name] [facility] ZOX:[id number] DOB: 1945-11-01 DOA: 04/18/2023  PCP: [name], MD  Admit date: 04/18/2023 Discharge date: 04/29/2023  Admitted From: Home Disposition: [facility]  Recommendations for Outpatient Follow-up:  Follow up with PCP in 1-2 weeks Referral placed for outpatient palliative care to follow on discharge, if further declines recommend transitioning to hospice.  Patient has expressed desire to not have any feeding tube/artificial nutrition Started on Lasix 10 mg p.o. daily for volume management, recommend occasional monitoring of weights and up titration as needed. Please obtain BMP/CBC in one week  Discharge Condition: Stable, overall long-term prognosis poor. CODE STATUS: DNR Diet recommendation: Heart healthy diet  History of present illness:  Sheena [name] is a 78 y.o. female with past medical history significant for squamous cell lung cancer (Dx 06/2022) s/p chemoradiation and declined prophylactic cranial irradiation, chronic respiratory failure on 2 L nasal cannula at baseline, paroxysmal atrial fibrillation not on anticoagulation, essential hypertension, breast cancer, recent hospitalization at [facility] for right hip fracture who presented to [facility] ED on 04/18/2023 via ambulance from [facility] with cough, lethargy, chills. On EMS arrival, patient's O2 saturation was 85% and placed on 10 L nonrebreather.  Afebrile.  Patient transported to ED for further evaluation management.   In the ED, temperature 97.9 F, HR 68, RR 20, BP 100/60, SpO2 100% on 5 L nasal cannula.  WBC 6.2, hemoglobin 10.2, platelet count 188.  Sodium 138, potassium 4.8, chloride 98, CO2 29, glucose 117, BUN 22, creatinine 0.90.  AST 35, ALT 23, total bilirubin 0.7.  Lactic acid 0.7.  COVID/influenza/RSV PCR negative.  Urinalysis unrevealing.  Chest x-ray with right middle/lower lobe collapse with elevation the right  hemidiaphragm, COPD, suspected left pleural effusion.  Patient was started on IV antibiotics.  TRH consulted for admission for acute on chronic respiratory failure with concern of community-acquired pneumonia, left pleural effusion.  Hospital course:  Acute on chronic hypoxic respiratory failure Patient presenting with acute shortness of breath, found to be hypoxic on baseline 2 L nasal cannula with SpO2 85% and placed on nonrebreather by EMS.  Workup in the ED, patient afebrile without leukocytosis.  COVID/influenza/RSV PCR negative, chest x-ray with right middle/lower lobe collapse with elevation hemidiaphragm suspected left pleural effusion.  Initially concern for possible mucous plugging or foreign body given right-sided atelectasis, seen by PCCM, Dr. Judeth Horn on 04/19/2023 with recommendation of thoracentesis, IV antibiotics and no further invasive testing at this time. Continue supplemental oxygen, maintain SpO2 greater than 88%, on 2 L nasal cannula baseline.    Left pleural effusion Imaging notable for left sided pleural effusion.  IR was consulted and patient underwent thoracentesis on 04/18/2022 with 400 mL yellow fluid removed.  Started on furosemide 10 mg p.o. daily.  Continue to monitor respiratory status, occasional weights and uptitrate if needed.   Community-acquired pneumonia Completed 7-day course of IV antibiotics. Continue supplemental oxygen, maintain SpO2 greater than 88%; 2L [place] at basline   Squamous cell lung cancer Follows with medical oncology outpatient, Dr. Shirline Frees.  Diagnosed April 2024.  Underwent chemotherapy with carboplatin and etoposide x 4 cycles with concurrent radiation therapy.  Patient declined prophylactic cranial irradiation.  MRI brain. Outpatient follow medical oncology   Anemia of chronic medical disease Hemoglobin 9.4, stable.   Recent closed intertrochanter fracture right hip Patient underwent ORIF with IMN on 04/02/2023. Tylenol as needed mild pain,  Oxycodone as needed severe pain, Robaxin 500 mg p.o. every 6 hours.  Muscle spasms.  Continue aspirin 1 mg p.o. daily for planned 3-day treatment for postoperative DVT prophylaxis per orthopedics.  Outpatient follow-up with orthopedics.   Paroxysmal atrial fibrillation Not on systemic anticoagulation due to history of GI bleeding.  Continue amiodarone 200 mg p.o. daily   Hyperlipidemia Atorvastatin 20 mg p.o. daily   Essential hypertension Currently not on antihypertensives outpatient.   Adult failure to thrive Severe protein calorie malnutrition Body mass index is 17.27 kg/m. Nutrition Status: Nutrition Problem: Severe Malnutrition Etiology: chronic illness (SCLC) Signs/Symptoms: severe fat depletion, severe muscle depletion, percent weight loss (17% in 10 months) Percent weight loss: 17 % (in 10 months) Interventions: Refer to RD note for recommendations, Ensure Enlive (each supplement provides 350kcal and 20 grams of protein), [facility]VI Dietitian following, continue encourage increased oral intake. Palliative care consulted for assistance with goals of care/medical decision making. Discussion with son/daughter regarding overall long-term poor prognosis, patient has stated previously that she would not want a feeding tube or her life maintained with artificial infusions.  Recommend discharging with palliative care to follow outpatient and anticipate likely need of transitioning to hospice in the near future.    Discharge Diagnoses:  Principal Problem:   HCAP (healthcare-associated pneumonia) Active Problems:   Primary small cell carcinoma of lower lobe of left lung (HCC)   Pleural effusion   Dysarthria   Physical deconditioning   Closed intertrochanteric fracture of hip, right, initial encounter (HCC) - s/p ORIF 04-02-2023 with IM nail   Anxiety   Essential hypertension, benign   Hyperlipidemia   PAF (paroxysmal atrial fibrillation) (HCC) - not on systemic anticoagulation due to hx of  GI bleeding   Macrocytic anemia   Sepsis with acute hypoxic respiratory failure without septic shock (HCC)   Protein-calorie malnutrition, severe    Discharge Instructions  Discharge Instructions     Call MD for:  difficulty breathing, headache or visual disturbances   Complete by: As directed    Call MD for:  extreme fatigue   Complete by: As directed    Call MD for:  persistant dizziness or light-headedness   Complete by: As directed    Call MD for:  persistant nausea and vomiting   Complete by: As directed    Call MD for:  severe uncontrolled pain   Complete by: As directed    Call MD for:  temperature >100.4   Complete by: As directed    Diet - low sodium heart healthy   Complete by: As directed    Increase activity slowly   Complete by: As directed       Allergies as of 04/29/2023       Reactions   Antihistamines, Diphenhydramine-type Other (See Comments)   palpitations, feel jittery   Aspirin Other (See Comments)   Stomach cramps   Evista [raloxifene] Other (See Comments)   Hot Flashes         Medication List     STOP taking these medications    amLODipine 5 MG tablet Commonly known as: NORVASC   lidocaine-prilocaine cream Commonly known as: EMLA   metoprolol tartrate 25 MG tablet Commonly known as: LOPRESSOR   polyethylene glycol 17 g packet Commonly known as: MIRALAX / GLYCOLAX   witch hazel-glycerin pad Commonly known as: TUCKS       TAKE these medications    acetaminophen 325 MG tablet Commonly known as: TYLENOL Take 2 tablets (650 mg total) by mouth every 6 (six) hours as needed for mild pain (pain score 1-3) (or  Fever >/= 101). What changed:  medication strength how much to take when to take this reasons to take this   amiodarone 200 MG tablet Commonly known as: PACERONE Take 1 tablet (200 mg total) by mouth daily.   aspirin EC 81 MG tablet Take 1 tablet (81 mg total) by mouth daily. Swallow whole.   atorvastatin 20 MG  tablet Commonly known as: LIPITOR TAKE 1 TABLET BY MOUTH EVERY DAY What changed: when to take this   CALCIUM CITRATE PETITE/VIT D PO Take 1 tablet by mouth 2 (two) times daily. 250mg - (200 unit)   cyanocobalamin 1000 MCG tablet Take 1 tablet (1,000 mcg total) by mouth daily.   docusate sodium 100 MG capsule Commonly known as: COLACE Take 1 capsule (100 mg total) by mouth 2 (two) times daily as needed for mild constipation. What changed:  when to take this reasons to take this   folic acid 1 MG tablet Commonly known as: FOLVITE Take 1 tablet (1 mg total) by mouth daily.   furosemide 20 MG tablet Commonly known as: LASIX Take 0.5 tablets (10 mg total) by mouth daily. Start taking on: April 30, 2023   lactose free nutrition Liqd Take 237 mLs by mouth in the morning and at bedtime.   magnesium oxide 400 MG tablet Commonly known as: MAG-OX Take 1 tablet by mouth daily.   methocarbamol 500 MG tablet Commonly known as: ROBAXIN Take 1 tablet (500 mg total) by mouth every 6 (six) hours as needed for muscle spasms.   oxyCODONE 5 MG immediate release tablet Commonly known as: Oxy IR/ROXICODONE Take 1-2 tablets (5-10 mg total) by mouth every 6 (six) hours as needed for severe pain (pain score 7-10) or moderate pain (pain score 4-6). What changed:  how much to take when to take this reasons to take this   Ethelda Chick Calcium/Vit D3 500-5 MG-MCG Tabs Generic drug: Calcium Carb-Cholecalciferol Take 1 tablet by mouth in the morning and at bedtime.   prochlorperazine 10 MG tablet Commonly known as: COMPAZINE Take 1 tablet (10 mg total) by mouth every 6 (six) hours as needed for nausea or vomiting.        Contact information for follow-up providers     [name], MD. Schedule an appointment as soon as possible for a visit in 1 week(s).   Specialty: Family Medicine Contact information: [address] [place] [address] [phone]               Contact information for after-discharge care     Destination     [facility] Preferred SNF .   Service: Skilled Nursing Contact information: [address] [place] [address] [phone]                    Allergies  Allergen Reactions   Antihistamines, Diphenhydramine-Type Other (See Comments)    palpitations, feel jittery   Aspirin Other (See Comments)    Stomach cramps   Evista [Raloxifene] Other (See Comments)    Hot Flashes     Consultations: Orthopedics Pulmonary critical care medicine: Signed off 04/19/2023 Interventional radiology   Procedures/Studies: DG CHEST PORT 1 VIEW Result Date: 04/29/2023 CLINICAL DATA:  [id number] Pleural effusion [id number] EXAM: PORTABLE CHEST 1 VIEW COMPARISON:  February Twenty-fifth, 2025 February seventeenth 2025 FINDINGS: The cardiomediastinal silhouette is unchanged in contour RIGHT-sided shift and volume loss. Similar fullness of the bilateral hila.RIGHT chest port tip terminating the superior cavoatrial junction. Small layering LEFT pleural effusion,  similar in comparison to prior. No pneumothorax. Bibasilar opacities appears similar in comparison to prior. Background of reticular opacities likely reflecting underlying emphysema. Status post vertebral augmentation of the lower thoracic spine. Atherosclerotic calcifications. IMPRESSION: Similar appearance of small residual layering LEFT pleural effusion with bibasilar opacities. Electronically Signed   By: Meda Klinefelter M.D.   On: 04/29/2023 07:55   DG Lumbar Spine 2-3 Views Result Date: 04/27/2023 CLINICAL DATA:  Shortness of breath with back pain and limited range of motion. EXAM: THORACIC SPINE 2 VIEWS; LUMBAR SPINE - 2-3 VIEW COMPARISON:  Chest CTA 04/19/2023, CT cervical spine 03/31/2023, CT thoracic spine 03/07/2022 and CT lumbar spine 12/09/2022. FINDINGS: Thoracic spine: The bones are diffusely demineralized. Patient is  status post spinal augmentation at T12 and L1. There are chronic compression fractures at T5, T8 and T11 which are grossly unchanged. No definite new fractures identified. Lumbar spine: As above, previous spinal augmentation at L1. Grossly stable superior endplate compression deformities at L2, L3 and L5, suboptimally evaluated due to obliquity on the lateral view and underlying osteopenia. No definite new fractures identified. Previous proximal right femoral ORIF. Diffuse aortoiliac atherosclerosis. IMPRESSION: 1. No definite acute osseous findings identified in the thoracic or lumbar spine. Evaluation limited by osseous demineralization and rotation. 2. Multiple chronic compression fractures in the thoracic and lumbar spine as described. Previous spinal augmentation at T12 and L1. Electronically Signed   By: Carey Bullocks M.D.   On: 04/27/2023 16:44   DG Thoracic Spine 2 View Result Date: 04/27/2023 CLINICAL DATA:  Shortness of breath with back pain and limited range of motion. EXAM: THORACIC SPINE 2 VIEWS; LUMBAR SPINE - 2-3 VIEW COMPARISON:  Chest CTA 04/19/2023, CT cervical spine 03/31/2023, CT thoracic spine 03/07/2022 and CT lumbar spine 12/09/2022. FINDINGS: Thoracic spine: The bones are diffusely demineralized. Patient is status post spinal augmentation at T12 and L1. There are chronic compression fractures at T5, T8 and T11 which are grossly unchanged. No definite new fractures identified. Lumbar spine: As above, previous spinal augmentation at L1. Grossly stable superior endplate compression deformities at L2, L3 and L5, suboptimally evaluated due to obliquity on the lateral view and underlying osteopenia. No definite new fractures identified. Previous proximal right femoral ORIF. Diffuse aortoiliac atherosclerosis. IMPRESSION: 1. No definite acute osseous findings identified in the thoracic or lumbar spine. Evaluation limited by osseous demineralization and rotation. 2. Multiple chronic compression  fractures in the thoracic and lumbar spine as described. Previous spinal augmentation at T12 and L1. Electronically Signed   By: Carey Bullocks M.D.   On: 04/27/2023 16:44   DG Chest 2 View Result Date: 04/27/2023 CLINICAL DATA:  Back pain and shortness of breath. Patient has stiffness EXAM: CHEST - 2 VIEW COMPARISON:  X-ray 04/26/2023. FINDINGS: Hyperinflation. Osteopenia. Right IJ chest port with tip along the central SVC. Bilateral small pleural effusions with adjacent opacities. Left-greater-than-right. No pneumothorax or edema. Normal cardiopericardial silhouette. Film is rotated to the right. There is some bandlike changes right perihilar, likely scar or atelectasis versus thickening along the minor fissure. Degenerative changes of the spine. Multilevel compression deformities with areas of augmentation cement as on prior. IMPRESSION: Rotated radiograph. Bilateral small pleural effusions with adjacent opacities, left-greater-than-right. Recommend follow-up. Right IJ chest port. Compression deformities along the spine with osteopenia. Augmentation cement at 2 levels of the lower thoracic spine. Please correlate with history Electronically Signed   By: Karen Kays M.D.   On: 04/27/2023 16:36   DG Chest 1 View Result Date: 04/26/2023 CLINICAL  DATA:  [id number] S/P thoracentesis [id number] EXAM: PORTABLE CHEST 1 VIEW COMPARISON:  Chest XR, 04/25/2023 and 04/19/2023. CT chest, 04/19/2023. FINDINGS: Support lines: RIGHT chest port with catheter tip within the SVC. Cardiac silhouette is within normal limits. Aortic arch atherosclerosis. Hyperinflation with relative flattening of the diaphragm. The RIGHT lung is clear. Improved aeration of the LEFT lung post thoracentesis with no pneumothorax or residual pleural effusion. Trace patchy retrocardiac opacity. Thoracolumbar vertebral augmentation change. No interval osseous abnormality. IMPRESSION: 1. Improved aeration of the LEFT lung post thoracentesis. No pneumothorax  or residual pleural effusion. 2. Aortic Atherosclerosis (ICD10-I70.0) and Emphysema (ICD10-J43.9). Electronically Signed   By: Roanna Banning M.D.   On: 04/26/2023 14:34   US THORACENTESIS ASP PLEURAL SPACE W/IMG GUIDE Result Date: 04/26/2023 INDICATION: 78 year old female with pneumonia and left pleural effusion. EXAM: ULTRASOUND GUIDED DIAGNOSTIC AND THERAPEUTIC LEFT THORACENTESIS MEDICATIONS: 10 mL 1% lidocaine COMPLICATIONS: None immediate. PROCEDURE: An ultrasound guided thoracentesis was thoroughly discussed with the patient and questions answered. The benefits, risks, alternatives and complications were also discussed. The patient understands and wishes to proceed with the procedure. Written consent was obtained. Ultrasound was performed to localize and mark an adequate pocket of fluid in the LEFT chest. The area was then prepped and draped in the normal sterile fashion. 1% Lidocaine was used for local anesthesia. Under ultrasound guidance a 6 Fr Safe-T-Centesis catheter was introduced. Thoracentesis was performed. The catheter was removed and a dressing applied. FINDINGS: A total of approximately 370 mL of serosanguineous fluid was removed. Samples were sent to the laboratory as requested by the clinical team. IMPRESSION: Successful ultrasound guided diagnostic and therapeutic LEFT thoracentesis yielding 370 mL of pleural fluid. Roanna Banning, MD [facility] Electronically Signed   By: Roanna Banning M.D.   On: 04/26/2023 14:30   DG Chest 2 View Result Date: 04/25/2023 CLINICAL DATA:  Shortness of breath and flu-like symptoms EXAM: CHEST - 2 VIEW COMPARISON:  Chest radiograph dated 04/19/2023 FINDINGS: Lines/tubes: Right chest wall port tip projects over the SVC. Lungs: Patient is rotated to the right. Hazy left lower lung opacity. Pleura: No pneumothorax.  Layering large left pleural effusion. Heart/mediastinum: Left heart border is obscured. Bones:  Compression deformities of the thoracic spine with vertebral augmentation. IMPRESSION: Layering large left pleural effusion with associated left lower lung atelectasis. Electronically Signed   By: Agustin Cree M.D.   On: 04/25/2023 17:48   DG Knee 1-2 Views Right Result Date: 04/24/2023 CLINICAL DATA:  Pain since surgery 04/02/2023. EXAM: RIGHT KNEE - 1-2 VIEW COMPARISON:  Right knee radiographs 03/31/2023 FINDINGS: There is diffuse decreased bone mineralization. Minimal chronic enthesopathic change at the quadriceps insertion on the patella. Tiny joint effusion. No significant joint space narrowing. No acute fracture or dislocation. Mild atherosclerotic calcifications. IMPRESSION: Tiny joint effusion. No acute fracture. Electronically Signed   By: Neita Garnet M.D.   On: 04/24/2023 16:09   DG HIP UNILAT WITH PELVIS 2-3 VIEWS RIGHT Result Date: 04/24/2023 CLINICAL DATA:  Right hip pain after surgery 04/02/2023. EXAM: DG HIP (WITH OR WITHOUT PELVIS) 2-3V RIGHT COMPARISON:  Pelvis and right hip radiographs 04/02/2023 FINDINGS: Redemonstration of cephalomedullary nail fixation of proximal femoral intertrochanteric fracture. No perihardware lucency is seen to indicate hardware failure or loosening. Unchanged alignment. There is again approximately 1 cm medial displacement of the right lesser trochanter. Mild bilateral sacroiliac subchondral sclerosis and minimal superior pubic symphysis joint space narrowing. IMPRESSION: Unchanged alignment of proximal femoral intertrochanteric fracture status  post cephalomedullary nail fixation. No evidence of hardware failure. Electronically Signed   By: Neita Garnet M.D.   On: 04/24/2023 16:08   MR BRAIN W WO CONTRAST Result Date: 04/21/2023 CLINICAL DATA:  Small cell lung carcinoma EXAM: MRI HEAD WITHOUT AND WITH CONTRAST TECHNIQUE: Multiplanar, multiecho pulse sequences of the brain and surrounding structures were obtained without and with intravenous contrast. CONTRAST:   4mL GADAVIST GADOBUTROL 1 MMOL/ML IV SOLN COMPARISON:  None Available. FINDINGS: Brain: No acute infarct, mass effect or extra-axial collection. No acute or chronic hemorrhage. There is multifocal hyperintense T2-weighted signal within the white matter. Parenchymal volume and CSF spaces are normal. The midline structures are normal. There is no abnormal contrast enhancement. Vascular: Normal flow voids. Skull and upper cervical spine: Normal calvarium and skull base. Visualized upper cervical spine and soft tissues are normal. Sinuses/Orbits:No paranasal sinus fluid levels or advanced mucosal thickening. No mastoid or middle ear effusion. Normal orbits. IMPRESSION: 1. No intracranial metastatic disease. 2. Findings of chronic small vessel ischemia. Electronically Signed   By: Deatra [name] M.D.   On: 04/21/2023 00:19   ECHOCARDIOGRAM COMPLETE Result Date: 04/20/2023    ECHOCARDIOGRAM REPORT   Patient Name:   Sheena [name] [facility] Date of Exam: 04/20/2023 Medical Rec #:  [id number]             Height:       60.0 in Accession #:    [id number]            Weight:       93.3 lb Date of Birth:  [birth year]             BSA:          1.350 m Patient Age:    77 years              BP:           100/49 mmHg Patient Gender: F                     HR:           81 bpm. Exam Location:  Inpatient Procedure: 2D Echo, Cardiac Doppler and Color Doppler (Both Spectral and Color            Flow Doppler were utilized during procedure). Indications:    Other abnormalities of the heart R00.8  History:        Patient has prior history of Echocardiogram examinations, most                 recent 09/11/2022. Arrythmias:Atrial Fibrillation;                 Signs/Symptoms:Hypotension and Murmur.  Sonographer:    Webb Laws Referring Phys: Marron.Salles DAVID GIRGUIS IMPRESSIONS  1. Left ventricular ejection fraction, by estimation, is 65 to 70%. The left ventricle has normal function. The left ventricle has no regional wall motion abnormalities.  Left ventricular diastolic parameters are indeterminate. Elevated left atrial pressure.  2. Right ventricular systolic function is hyperdynamic. The right ventricular size is normal. Tricuspid regurgitation signal is inadequate for assessing PA pressure.  3. The mitral valve is normal in structure. No evidence of mitral valve regurgitation. No evidence of mitral stenosis.  4. The aortic valve is tricuspid. Aortic valve regurgitation is trivial. No aortic stenosis is present.  5. The inferior vena cava is dilated in size with <50% respiratory variability, suggesting right atrial pressure of 15 mmHg. Comparison(s): Prior images reviewed side  by side. IVC more dilated. FINDINGS  Left Ventricle: Left ventricular ejection fraction, by estimation, is 65 to 70%. The left ventricle has normal function. The left ventricle has no regional wall motion abnormalities. Strain imaging was not performed. The left ventricular internal cavity  size was normal in size. There is no left ventricular hypertrophy. Left ventricular diastolic parameters are indeterminate. Elevated left atrial pressure. Right Ventricle: The right ventricular size is normal. No increase in right ventricular wall thickness. Right ventricular systolic function is hyperdynamic. Tricuspid regurgitation signal is inadequate for assessing PA pressure. Left Atrium: Left atrial size was normal in size. Right Atrium: Right atrial size was not well visualized. Pericardium: There is no evidence of pericardial effusion. Mitral Valve: The mitral valve is normal in structure. No evidence of mitral valve regurgitation. No evidence of mitral valve stenosis. Tricuspid Valve: The tricuspid valve is normal in structure. Tricuspid valve regurgitation is not demonstrated. No evidence of tricuspid stenosis. Aortic Valve: The aortic valve is tricuspid. Aortic valve regurgitation is trivial. Aortic regurgitation PHT measures 314 msec. No aortic stenosis is present. Aortic valve peak  gradient measures 15.9 mmHg. Pulmonic Valve: The pulmonic valve was not well visualized. Pulmonic valve regurgitation is not visualized. No evidence of pulmonic stenosis. Aorta: The aortic root and ascending aorta are structurally normal, with no evidence of dilitation. Venous: The inferior vena cava is dilated in size with less than 50% respiratory variability, suggesting right atrial pressure of 15 mmHg. IAS/Shunts: The atrial septum is grossly normal. Additional Comments: 3D imaging was not performed.  LEFT VENTRICLE PLAX 2D LVIDd:         2.90 cm   Diastology LVIDs:         2.50 cm   LV e' medial:    6.85 cm/s LV PW:         1.00 cm   LV E/e' medial:  17.8 LV IVS:        1.10 cm   LV e' lateral:   7.18 cm/s LVOT diam:     1.60 cm   LV E/e' lateral: 17.0 LV SV:         56 LV SV Index:   41 LVOT Area:     2.01 cm  RIGHT VENTRICLE             IVC RV Basal diam:  3.40 cm     IVC diam: 2.10 cm RV S prime:     10.10 cm/s TAPSE (M-mode): 2.5 cm LEFT ATRIUM             Index        RIGHT ATRIUM           Index LA diam:        3.40 cm 2.52 cm/m   RA Area:     10.40 cm LA Vol (A2C):   44.0 ml 32.59 ml/m  RA Volume:   23.10 ml  17.11 ml/m LA Vol (A4C):   40.0 ml 29.63 ml/m LA Biplane Vol: 43.0 ml 31.85 ml/m  AORTIC VALVE AV Area (Vmax): 1.21 cm AV Vmax:        199.50 cm/s AV Peak Grad:   15.9 mmHg LVOT Vmax:      120.00 cm/s LVOT Vmean:     84.300 cm/s LVOT VTI:       0.277 m AI PHT:         314 msec  AORTA Ao Root diam: 2.10 cm Ao Asc diam:  2.80 cm MITRAL VALVE MV  Area (PHT): 4.21 cm     SHUNTS MV Decel Time: 180 msec     Systemic VTI:  0.28 m MV E velocity: 122.00 cm/s  Systemic Diam: 1.60 cm MV A velocity: 132.00 cm/s MV E/A ratio:  0.92 Riley Lam MD Electronically signed by Riley Lam MD Signature Date/Time: 04/20/2023/12:52:37 PM    Final    DG Chest 1 View Result Date: 04/19/2023 CLINICAL DATA:  Status post left-sided thoracentesis EXAM: CHEST  1 VIEW COMPARISON:  04/18/2023 FINDINGS:  Right Port-A-Cath tip at superior caval/atrial junction. Patient rotated left. Mild cardiomegaly. Atherosclerosis in the transverse aorta. Resolved left pleural effusion. No pneumothorax. There may be trace right pleural fluid. Mild interstitial prominence and indistinctness. Improved right base aeration with resolution of previously described right lower and right middle lobe collapse. Persistent left lower lobe airspace disease with volume loss. Lower thoracic vertebral augmentation. IMPRESSION: No pneumothorax after left-sided thoracentesis. Improved right and persistent left lower lung airspace disease. Cardiomegaly with mild interstitial edema. Electronically Signed   By: Jeronimo Greaves M.D.   On: 04/19/2023 17:49   US THORACENTESIS ASP PLEURAL SPACE W/IMG GUIDE Result Date: 04/19/2023 INDICATION: 78 year old woman with pneumonia and pleural effusion. Diagnostic thoracentesis. EXAM: ULTRASOUND GUIDED LEFT THORACENTESIS MEDICATIONS: 10 mL 1% lidocaine COMPLICATIONS: None immediate. PROCEDURE: An ultrasound guided thoracentesis was thoroughly discussed with the patient and questions answered. The benefits, risks, alternatives and complications were also discussed. The patient understands and wishes to proceed with the procedure. Written consent was obtained. Ultrasound was performed to localize and mark an adequate pocket of fluid in the Left chest. The area was then prepped and draped in the normal sterile fashion. 1% Lidocaine was used for local anesthesia. Under ultrasound guidance a catheter was introduced. Thoracentesis was performed. The catheter was removed and a dressing applied. FINDINGS: A total of approximately 400 mL of yellow fluid was removed. Samples were sent to the laboratory as requested by the clinical team. IMPRESSION: Successful ultrasound guided left thoracentesis yielding 400 mL of pleural fluid. Performed By Theresa Mulligan, PA-C Electronically Signed   By: Corlis Leak M.D.   On:  04/19/2023 15:55   CT Angio Chest PE W/Cm &/Or Wo Cm Result Date: 04/19/2023 CLINICAL DATA:  Increasing oxygen requirement EXAM: CT ANGIOGRAPHY CHEST WITH CONTRAST TECHNIQUE: Multidetector CT imaging of the chest was performed using the standard protocol during bolus administration of intravenous contrast. Multiplanar CT image reconstructions and MIPs were obtained to evaluate the vascular anatomy. RADIATION DOSE REDUCTION: This exam was performed according to the departmental dose-optimization program which includes automated exposure control, adjustment of the mA and/or kV according to patient size and/or use of iterative reconstruction technique. CONTRAST:  80mL OMNIPAQUE IOHEXOL 350 MG/ML SOLN COMPARISON:  Chest x-ray from earlier in the same day, CT from 01/30/2023 FINDINGS: Cardiovascular: Atherosclerotic calcifications of the thoracic aorta are noted without aneurysmal dilatation or dissection. Coronary calcifications are noted. No cardiac enlargement is seen. Right chest wall port is noted. Pulmonary artery shows a normal branching pattern bilaterally. No intraluminal filling defect to suggest pulmonary embolism is noted. Mediastinum/Nodes: Thoracic inlet is within normal limits. No hilar or mediastinal adenopathy is noted. The esophagus as visualized is within normal limits. Lungs/Pleura: Large left-sided pleural effusion is noted with mild left lower lobe consolidation. Increased in the interval from the prior exam. The right lung demonstrates inspissated material within the right lower lobe bronchial tree with right lower lobe collapse similar to that seen on prior plain film examination. Right middle  lobe collapse is noted secondary to the inspissated material is well. This may represent a large mucous plug or possible aspirated material. Upper Abdomen: Visualized upper abdomen shows no acute abnormality. Musculoskeletal: No acute rib abnormality is noted. Multilevel chronic thoracic compression  deformities are noted with evidence of vertebral augmentation at T12 and L1. These changes are stable from the prior exam. Review of the MIP images confirms the above findings. IMPRESSION: No evidence of pulmonary emboli. Inspissated material within the right middle and lower lobe bronchial tree with collapse of both lobes identified. Left-sided pleural effusion with associated left lower lobe consolidation. Chronic compression deformities some with evidence of prior augmentation. Aortic Atherosclerosis (ICD10-I70.0). Electronically Signed   By: Alcide Clever M.D.   On: 04/19/2023 00:54   DG Chest Port 1 View Result Date: 04/18/2023 CLINICAL DATA:  Sepsis EXAM: PORTABLE CHEST 1 VIEW COMPARISON:  03/31/2023 FINDINGS: There is right middle and lower lobe collapse with elevation of the right hemidiaphragm and right-sided volume loss. Right upper lobe is clear. Left lung appears hyperinflated, in keeping with changes of underlying COPD. Left pleural effusion is suspected. Right internal jugular chest port tip is seen within the superior vena cava. There is mediastinal shift to the right with obscuration of the cardiac silhouette. No acute bone abnormality. Multilevel vertebroplasty and numerous midthoracic compression deformities again noted. IMPRESSION: 1. Right middle and lower lobe collapse with elevation of the right hemidiaphragm and right-sided volume loss. Correlation for a central obstructing lesion, such as a mucous plug or aspirated foreign is recommended given its relatively rapid development. 2. COPD. 3. Suspected left pleural effusion. Electronically Signed   By: Helyn Numbers M.D.   On: 04/18/2023 22:28   DG Foot Complete Right Result Date: 04/06/2023 CLINICAL DATA:  RIGHT foot pain EXAM: RIGHT FOOT COMPLETE - 3+ VIEW COMPARISON:  None Available. FINDINGS: No fracture or dislocation of mid foot or forefoot. The phalanges are normal. The calcaneus is normal. No soft tissue abnormality. IMPRESSION: No  fracture or dislocation. Electronically Signed   By: Genevive Bi M.D.   On: 04/06/2023 09:03   DG HIP PORT UNILAT W OR W/O PELVIS 1V RIGHT Result Date: 04/02/2023 CLINICAL DATA:  Status postoperative fixation of a right hip intertrochanteric fracture. EXAM: DG HIP (WITH OR WITHOUT PELVIS) 1V PORT RIGHT COMPARISON:  03/31/2023 and earlier today. FINDINGS: Interval intramedullary rod and compression screw fixation of the previously demonstrated comminuted intertrochanteric fracture on the right. Essentially anatomic position and alignment of the major fragments. Diffuse osteopenia. No new fractures. IMPRESSION: Status post ORIF of a right hip intertrochanteric fracture. Electronically Signed   By: Beckie Salts M.D.   On: 04/02/2023 19:31   DG HIP UNILAT WITH PELVIS 2-3 VIEWS RIGHT Result Date: 04/02/2023 CLINICAL DATA:  Elective surgery. Right hip intraoperative fluoroscopy. EXAM: DG HIP (WITH OR WITHOUT PELVIS) 2-3V RIGHT COMPARISON:  AP pelvis 03/31/2023 FINDINGS: Images were performed intraoperatively without the presence of a radiologist. The patient is undergoing cephalomedullary nail fixation of the previously seen intertrochanteric fracture of the proximal right femur. No hardware complication is seen. Improved alignment. Total fluoroscopy images: 6 Total fluoroscopy time: 65 seconds Total dose: Radiation Exposure Index (as provided by the fluoroscopic device): 4.35 mGy air Kerma Please see intraoperative findings for further detail. IMPRESSION: Intraoperative fluoroscopy for cephalomedullary nail fixation of the proximal right femur. Electronically Signed   By: Neita Garnet M.D.   On: 04/02/2023 15:43   DG C-Arm 1-60 Min-No Report Result Date: 04/02/2023 Fluoroscopy was utilized  by the requesting physician.  No radiographic interpretation.   DG Knee Right Port Result Date: 03/31/2023 CLINICAL DATA:  Fall.  Hip fracture. EXAM: PORTABLE RIGHT KNEE - 1-2 VIEW COMPARISON:  None Available.  FINDINGS: There is diffuse osteopenia of the visualized osseous structures. No acute fracture or dislocation. No aggressive osseous lesion. No significant degenerative changes. No knee effusion or focal soft tissue swelling. No radiopaque foreign bodies. IMPRESSION: *No acute osseous abnormality of the right knee. Electronically Signed   By: Jules Schick M.D.   On: 03/31/2023 16:38   CT Head Wo Contrast Result Date: 03/31/2023 CLINICAL DATA:  Head trauma, moderate-severe; Neck trauma (Age >= 65y). Fall last night. Forehead laceration. EXAM: CT HEAD WITHOUT CONTRAST CT CERVICAL SPINE WITHOUT CONTRAST TECHNIQUE: Multidetector CT imaging of the head and cervical spine was performed following the standard protocol without intravenous contrast. Multiplanar CT image reconstructions of the cervical spine were also generated. RADIATION DOSE REDUCTION: This exam was performed according to the departmental dose-optimization program which includes automated exposure control, adjustment of the mA and/or kV according to patient size and/or use of iterative reconstruction technique. COMPARISON:  Head MRI 01/30/2023 FINDINGS: CT HEAD FINDINGS Brain: There is no evidence of an acute infarct, intracranial hemorrhage, mass, midline shift, or extra-axial fluid collection. There is mild generalized cerebral atrophy. Cerebral white matter hypodensities are nonspecific but compatible with mild chronic small vessel ischemic disease. Vascular: Calcified atherosclerosis at the skull base. No hyperdense vessel. Skull: No acute fracture or destructive lesion. Sinuses/Orbits: The included paranasal sinuses and mastoid air cells are largely clear. Unremarkable orbits. Other: None. CT CERVICAL SPINE FINDINGS Alignment: Cervical spine straightening. Trace anterolisthesis of C7 on T1, likely degenerative. Skull base and vertebrae: No acute fracture or suspicious osseous lesion. Moderate atlantodental degenerative changes. Soft tissues and  spinal canal: No prevertebral fluid or swelling. No visible canal hematoma. Disc levels: Very mild disc degeneration for age. Up to moderate multilevel facet arthrosis. Upper chest: Emphysema. Other: Calcified atherosclerosis about the left greater than right carotid bifurcations. Partially visualized right internal jugular venous catheter. IMPRESSION: 1. No evidence of acute intracranial abnormality or cervical spine fracture. 2. Mild chronic small vessel ischemic disease. Electronically Signed   By: Sebastian Ache M.D.   On: 03/31/2023 14:34   CT Cervical Spine Wo Contrast Result Date: 03/31/2023 CLINICAL DATA:  Head trauma, moderate-severe; Neck trauma (Age >= 65y). Fall last night. Forehead laceration. EXAM: CT HEAD WITHOUT CONTRAST CT CERVICAL SPINE WITHOUT CONTRAST TECHNIQUE: Multidetector CT imaging of the head and cervical spine was performed following the standard protocol without intravenous contrast. Multiplanar CT image reconstructions of the cervical spine were also generated. RADIATION DOSE REDUCTION: This exam was performed according to the departmental dose-optimization program which includes automated exposure control, adjustment of the mA and/or kV according to patient size and/or use of iterative reconstruction technique. COMPARISON:  Head MRI 01/30/2023 FINDINGS: CT HEAD FINDINGS Brain: There is no evidence of an acute infarct, intracranial hemorrhage, mass, midline shift, or extra-axial fluid collection. There is mild generalized cerebral atrophy. Cerebral white matter hypodensities are nonspecific but compatible with mild chronic small vessel ischemic disease. Vascular: Calcified atherosclerosis at the skull base. No hyperdense vessel. Skull: No acute fracture or destructive lesion. Sinuses/Orbits: The included paranasal sinuses and mastoid air cells are largely clear. Unremarkable orbits. Other: None. CT CERVICAL SPINE FINDINGS Alignment: Cervical spine straightening. Trace anterolisthesis of  C7 on T1, likely degenerative. Skull base and vertebrae: No acute fracture or suspicious osseous lesion. Moderate atlantodental degenerative  changes. Soft tissues and spinal canal: No prevertebral fluid or swelling. No visible canal hematoma. Disc levels: Very mild disc degeneration for age. Up to moderate multilevel facet arthrosis. Upper chest: Emphysema. Other: Calcified atherosclerosis about the left greater than right carotid bifurcations. Partially visualized right internal jugular venous catheter. IMPRESSION: 1. No evidence of acute intracranial abnormality or cervical spine fracture. 2. Mild chronic small vessel ischemic disease. Electronically Signed   By: Sebastian Ache M.D.   On: 03/31/2023 14:34   DG Pelvis Portable Result Date: 03/31/2023 CLINICAL DATA:  Fall, right hip pain EXAM: PORTABLE PELVIS 1-2 VIEWS COMPARISON:  12/09/2022 FINDINGS: Acute, comminuted intertrochanteric fracture of the proximal right femur with approximately 90 degrees of varus angulation. Hip joint intact without dislocation. Bony pelvis intact without fracture or diastasis. Soft tissue swelling is evident the lateral aspect of the right hip. IMPRESSION: Acute, comminuted intertrochanteric fracture of the proximal right femur with varus angulation. Electronically Signed   By: Duanne Guess D.O.   On: 03/31/2023 14:23   DG Chest Portable 1 View Result Date: 03/31/2023 CLINICAL DATA:  Fall and right hip fracture. EXAM: PORTABLE CHEST 1 VIEW COMPARISON:  Chest radiograph dated 12/20/2022. FINDINGS: Right-sided Port-A-Cath in similar position. The background of emphysema. No focal consolidation, pleural effusion, or pneumothorax. The cardiac silhouette is within normal limits. Atherosclerotic calcification of the aorta. Lower thoracic vertebroplasty. No acute osseous pathology. IMPRESSION: 1. No active disease. 2. Emphysema. Electronically Signed   By: Elgie Collard M.D.   On: 03/31/2023 14:22     Subjective: Patient  seen examined bedside, lying in bed.  States she is not hungry this morning.  States she had part of a fish sandwich from [employer] yesterday that her son brought in.  Discussed plan of care with both son and daughter this morning.  Overall long-term prognosis appears poor given her failure to thrive, lack of nutritional support.  They report that she has not desired any artificial needs to sustain life including PEG tube placement and tube feeds.  Discharging to rehab today with palliative care to follow.  Anticipate likely will need to transition to hospice in the near future.  Patient with no other specific complaints or concerns at this time.  Denies headache, no dizziness, no chest pain, no palpitations, no shortness of breath, no abdominal pain, no fever/chills/night sweats, no focal weakness, no fatigue, no paresthesias.  No acute events overnight per nursing.  Discharge Exam: Vitals:   04/28/23 2013 04/29/23 0632  BP: (!) 112/46 (!) 107/44  Pulse: 72 72  Resp: 17 17  Temp: 98.3 F (36.8 C) 98 F (36.7 C)  SpO2: 96% 94%   Vitals:   04/28/23 1734 04/28/23 2013 04/29/23 0632 04/29/23 0714  BP: (!) 114/48 (!) 112/46 (!) 107/44   Pulse: 88 72 72   Resp: 18 17 17    Temp: 98.5 F (36.9 C) 98.3 F (36.8 C) 98 F (36.7 C)   TempSrc: Oral Oral Oral   SpO2: 95% 96% 94%   Weight:    87.3 kg  Height:        Physical Exam: GEN: NAD, alert and oriented x 3, chronically ill/cachectic in appearance HEENT: NCAT, PERRL, EOMI, sclera clear, MMM PULM: Breath sounds slightly diminished bilateral bases, no wheezing/crackles, normal respiratory effort without accessory muscle use, on 2 L nasal cannula. CV: RRR w/o M/G/R GI: abd soft, NTND, NABS, no R/G/M MSK: no peripheral edema, moves all extremity dependently, noted surgical dressing in place, clean/dry/intact without surrounding fluctuance/erythema NEURO:  CN II-XII intact, no focal deficits, sensation to light touch intact PSYCH: normal  mood/affect Integumentary: Focal slight right lower extremity noted as above, otherwise no other concerning rashes/lesions/wounds noted on exposed skin surfaces.    The results of significant diagnostics from this hospitalization (including imaging, microbiology, ancillary and laboratory) are listed below for reference.     Microbiology: Recent Results (from the past 240 hours)  Body fluid culture w Gram Stain     Status: None   Collection Time: 04/19/23  3:29 PM   Specimen: PATH Cytology Pleural fluid  Result Value Ref Range Status   Specimen Description   Final    PLEURAL Performed at [facility], [address] [place], [place] [address]    Special Requests   Final    NONE Performed at [facility], [address], [place], [place] [address]    Gram Stain NO WBC SEEN NO ORGANISMS SEEN   Final   Culture   Final    NO GROWTH 3 DAYS Performed at [facility], [address], [place], [place] [address]    Report Status 04/23/2023 FINAL  Final  MRSA Next Gen by PCR, Nasal     Status: None   Collection Time: 04/19/23  4:50 PM   Specimen: Nasal Mucosa; Nasal Swab  Result Value Ref Range Status   MRSA by PCR Next Gen NOT DETECTED NOT DETECTED Final    Comment: (NOTE) The GeneXpert MRSA Assay (FDA approved for NASAL specimens only), is one component of a comprehensive MRSA colonization surveillance program. It is not intended to diagnose MRSA infection nor to guide or monitor treatment for MRSA infections. Test performance is not FDA approved in patients less than 85 years old. Performed at [facility], [address], [place], [place] [address]   Body fluid culture w Gram Stain     Status: None (Preliminary result)   Collection Time: 04/26/23 12:01 PM   Specimen: PATH Cytology Pleural fluid  Result Value Ref Range Status   Specimen Description   Final    PLEURAL Performed at [facility],  [address], [place], [place] [address]    Special Requests   Final    NONE Performed at [facility], [address], [place], [place] [address]    Gram Stain NO WBC SEEN NO ORGANISMS SEEN   Final   Culture   Final    NO GROWTH 3 DAYS Performed at [facility], [address], [place], [place] [address]    Report Status PENDING  Incomplete     Labs: BNP (last 3 results) Recent Labs    12/09/22 2152 04/19/23 1646 04/23/23 1506  BNP 254.3* 590.1* 188.2*   Basic Metabolic Panel: Recent Labs  Lab 04/23/23 0406 04/24/23 0434 04/25/23 0347 04/27/23 0634 04/28/23 0424  NA 135 136 136 137 133*  K 3.6 3.7 3.3* 4.5 3.9  CL 93* 92* 95* 93* 91*  CO2 31 34* 32 29 30  GLUCOSE 85 101* 93 108* 117*  BUN 8 7* 6* 13 20  CREATININE 0.69 0.78 0.90 1.11* 1.20*  CALCIUM 8.4* 8.2* 8.2* 8.8* 8.8*  MG 1.9 1.9 2.0 1.9 2.1  PHOS  --   --   --  4.4 4.2   Liver Function Tests: Recent Labs  Lab 04/27/23 0634 04/28/23 0424  AST 38 53*  ALT 26 32  ALKPHOS 100 104  BILITOT 0.7 0.7  PROT 6.3*  6.1*  ALBUMIN 2.9* 2.9*   No results for input(s): "LIPASE", "AMYLASE" in the last 168 hours. No results for input(s): "AMMONIA" in the last 168 hours. CBC: Recent Labs  Lab 04/23/23 0406 04/24/23 0434 04/25/23 0347 04/27/23 0630 04/28/23 0424  WBC 4.8 5.5 5.7 6.4 7.0  NEUTROABS 3.3 3.8 4.2 4.9 4.5  HGB 9.5* 9.2* 8.8* 9.3* 9.4*  HCT 31.4* 30.1* 29.1* 31.2* 31.0*  MCV 109.4* 109.1* 109.0* 109.1* 106.5*  PLT 176 163 135* 168 165   Cardiac Enzymes: No results for input(s): "CKTOTAL", "CKMB", "CKMBINDEX", "TROPONINI" in the last 168 hours. BNP: Invalid input(s): "POCBNP" CBG: No results for input(s): "GLUCAP" in the last 168 hours. D-Dimer No results for input(s): "DDIMER" in the last 72 hours. Hgb A1c No results for input(s): "HGBA1C" in the last 72 hours. Lipid Profile No results for input(s): "CHOL", "HDL", "LDLCALC", "TRIG", "CHOLHDL", "LDLDIRECT"  in the last 72 hours. Thyroid function studies No results for input(s): "TSH", "T4TOTAL", "T3FREE", "THYROIDAB" in the last 72 hours.  Invalid input(s): "FREET3" Anemia work up No results for input(s): "VITAMINB12", "FOLATE", "FERRITIN", "TIBC", "IRON", "RETICCTPCT" in the last 72 hours. Urinalysis    Component Value Date/Time   COLORURINE YELLOW 04/20/2023 1559   APPEARANCEUR CLEAR 04/20/2023 1559   LABSPEC 1.029 04/20/2023 1559   PHURINE 5.0 04/20/2023 1559   GLUCOSEU NEGATIVE 04/20/2023 1559   HGBUR NEGATIVE 04/20/2023 1559   BILIRUBINUR NEGATIVE 04/20/2023 1559   BILIRUBINUR neg 03/06/2021 1200   KETONESUR 5 (A) 04/20/2023 1559   PROTEINUR NEGATIVE 04/20/2023 1559   UROBILINOGEN 0.2 03/06/2021 1200   UROBILINOGEN 0.2 10/13/2010 1536   NITRITE NEGATIVE 04/20/2023 1559   LEUKOCYTESUR NEGATIVE 04/20/2023 1559   Sepsis Labs Recent Labs  Lab 04/24/23 0434 04/25/23 0347 04/27/23 0630 04/28/23 0424  WBC 5.5 5.7 6.4 7.0   Microbiology Recent Results (from the past 240 hours)  Body fluid culture w Gram Stain     Status: None   Collection Time: 04/19/23  3:29 PM   Specimen: PATH Cytology Pleural fluid  Result Value Ref Range Status   Specimen Description   Final    PLEURAL Performed at [facility], [address], [place], [place] [address]    Special Requests   Final    NONE Performed at [facility], [address], [place], [place] [address]    Gram Stain NO WBC SEEN NO ORGANISMS SEEN   Final   Culture   Final    NO GROWTH 3 DAYS Performed at [facility], [address] [place], [place] [address]    Report Status 04/23/2023 FINAL  Final  MRSA Next Gen by PCR, Nasal     Status: None   Collection Time: 04/19/23  4:50 PM   Specimen: Nasal Mucosa; Nasal Swab  Result Value Ref Range Status   MRSA by PCR Next Gen NOT DETECTED NOT DETECTED Final    Comment: (NOTE) The GeneXpert MRSA Assay (FDA approved for NASAL  specimens only), is one component of a comprehensive MRSA colonization surveillance program. It is not intended to diagnose MRSA infection nor to guide or monitor treatment for MRSA infections. Test performance is not FDA approved in patients less than 42 years old. Performed at [facility], [address] [place], [place] [address]   Body fluid culture w Gram Stain     Status: None (Preliminary result)   Collection Time: 04/26/23 12:01 PM   Specimen: PATH Cytology Pleural fluid  Result Value  Ref Range Status   Specimen Description   Final    PLEURAL Performed at [facility], [address], [place], [place] [address]    Special Requests   Final    NONE Performed at [facility], [address], [place], [place] [address]    Gram Stain NO WBC SEEN NO ORGANISMS SEEN   Final   Culture   Final    NO GROWTH 3 DAYS Performed at [facility], [address] [place], [place] [address]    Report Status PENDING  Incomplete     Time coordinating discharge: Over 30 minutes  SIGNED:   Alvira Philips [place], DO  [facility] 04/29/2023, 11:52 AM

## 2023-04-29 NOTE — Consult Note (Signed)
 [facility] Liaison Consult Note   04/29/2023  Sheena [name] [name] 08/17/1945 [id number]  [facility] Consult: Reviewed for readmission less than 30 days  [facility]] [name] Liaison for remote coverage review for patient admitted to [name]     Primary Care Provider:  [name], MD with [facility] at [facility] is listed for the transition of care follow up and TOC calls.  Insurance: [employer] Dual Complete  Patient was reviewed for less than 30 days readmission with extreme high risk score with a 10 day length of stay and for any barriers to care when returning to community. Admitted with sepsis with acute hypoxia and HCAP.  Patient was screened for hospitalization and on behalf of [facility] Care Coordination to assess for post hospital community care needs.  Patient is being recommended to return for a skilled nursing facility level of care for post hospital transition.  Plan:  If transitions to affiliated facility, the [employer] [facility] RN can be notified and follow for any known needs for transitional care needs for returning to post facility care coordination needs for returning to community. Currently, [facility] to [name] an affiliated [facility]. Notes for Palliative follow up as well per inpatient [name] team notes.  For questions or referrals, please contact:   Charlesetta Shanks, RN, BSN, CCM [place]  [facility], [facility] Liaison Direct Dial: [phone] or secure chat Email: [email or web address]

## 2023-04-29 NOTE — Plan of Care (Signed)

## 2023-04-29 NOTE — Progress Notes (Signed)
   [facility] Liaison Note:  Notified by [facility] manager of patient/family request for [facility] Palliative services at home after discharge.   Please call with any hospice or outpatient palliative care related questions.   Thank you for the opportunity to participate in this patient's care.   Glenna Fellows, BSN, RN, OCN [employer] [phone]

## 2023-04-29 NOTE — Progress Notes (Signed)
 Hydrographic surveyor. Nicholos Johns, contacted Clinical research associate for report. Daughter [name] contacted and notified of transport to [facility].

## 2023-04-29 NOTE — Progress Notes (Signed)
 [facility] contacted for transportation needs. Attempted to contact [facility] to give report. Caller provided a phone number [phone]. Writer attempted to reach RN three times but was unsuccessful. General voicemail message. Writer did not leave a message.

## 2023-04-30 DIAGNOSIS — K5900 Constipation, unspecified: Secondary | ICD-10-CM | POA: Diagnosis not present

## 2023-04-30 DIAGNOSIS — E785 Hyperlipidemia, unspecified: Secondary | ICD-10-CM | POA: Diagnosis not present

## 2023-04-30 DIAGNOSIS — R627 Adult failure to thrive: Secondary | ICD-10-CM | POA: Diagnosis not present

## 2023-04-30 DIAGNOSIS — R52 Pain, unspecified: Secondary | ICD-10-CM | POA: Diagnosis not present

## 2023-04-30 DIAGNOSIS — L449 Papulosquamous disorder, unspecified: Secondary | ICD-10-CM | POA: Diagnosis not present

## 2023-04-30 DIAGNOSIS — E43 Unspecified severe protein-calorie malnutrition: Secondary | ICD-10-CM | POA: Diagnosis not present

## 2023-04-30 DIAGNOSIS — R6520 Severe sepsis without septic shock: Secondary | ICD-10-CM | POA: Diagnosis not present

## 2023-04-30 DIAGNOSIS — I69822 Dysarthria following other cerebrovascular disease: Secondary | ICD-10-CM | POA: Diagnosis not present

## 2023-04-30 DIAGNOSIS — A419 Sepsis, unspecified organism: Secondary | ICD-10-CM | POA: Diagnosis not present

## 2023-04-30 DIAGNOSIS — I480 Paroxysmal atrial fibrillation: Secondary | ICD-10-CM | POA: Diagnosis not present

## 2023-04-30 DIAGNOSIS — I10 Essential (primary) hypertension: Secondary | ICD-10-CM | POA: Diagnosis not present

## 2023-04-30 LAB — BODY FLUID CULTURE W GRAM STAIN: Gram Stain: NONE SEEN

## 2023-05-01 DIAGNOSIS — M545 Low back pain, unspecified: Secondary | ICD-10-CM | POA: Diagnosis not present

## 2023-05-01 DIAGNOSIS — T796XXD Traumatic ischemia of muscle, subsequent encounter: Secondary | ICD-10-CM | POA: Diagnosis not present

## 2023-05-01 DIAGNOSIS — C3490 Malignant neoplasm of unspecified part of unspecified bronchus or lung: Secondary | ICD-10-CM | POA: Diagnosis not present

## 2023-05-01 DIAGNOSIS — C3432 Malignant neoplasm of lower lobe, left bronchus or lung: Secondary | ICD-10-CM | POA: Diagnosis present

## 2023-05-01 DIAGNOSIS — A419 Sepsis, unspecified organism: Secondary | ICD-10-CM | POA: Diagnosis not present

## 2023-05-01 DIAGNOSIS — J9601 Acute respiratory failure with hypoxia: Secondary | ICD-10-CM | POA: Diagnosis not present

## 2023-05-01 DIAGNOSIS — R6520 Severe sepsis without septic shock: Secondary | ICD-10-CM | POA: Diagnosis not present

## 2023-05-01 DIAGNOSIS — G8929 Other chronic pain: Secondary | ICD-10-CM | POA: Diagnosis not present

## 2023-05-01 DIAGNOSIS — Z87891 Personal history of nicotine dependence: Secondary | ICD-10-CM | POA: Diagnosis not present

## 2023-05-01 DIAGNOSIS — J439 Emphysema, unspecified: Secondary | ICD-10-CM | POA: Diagnosis not present

## 2023-05-01 DIAGNOSIS — S72141D Displaced intertrochanteric fracture of right femur, subsequent encounter for closed fracture with routine healing: Secondary | ICD-10-CM | POA: Diagnosis not present

## 2023-05-01 DIAGNOSIS — S32009D Unspecified fracture of unspecified lumbar vertebra, subsequent encounter for fracture with routine healing: Secondary | ICD-10-CM | POA: Diagnosis not present

## 2023-05-01 DIAGNOSIS — E43 Unspecified severe protein-calorie malnutrition: Secondary | ICD-10-CM | POA: Diagnosis not present

## 2023-05-01 DIAGNOSIS — R471 Dysarthria and anarthria: Secondary | ICD-10-CM | POA: Diagnosis not present

## 2023-05-01 DIAGNOSIS — J9611 Chronic respiratory failure with hypoxia: Secondary | ICD-10-CM | POA: Diagnosis not present

## 2023-05-01 DIAGNOSIS — E875 Hyperkalemia: Secondary | ICD-10-CM | POA: Diagnosis not present

## 2023-05-01 DIAGNOSIS — N179 Acute kidney failure, unspecified: Secondary | ICD-10-CM | POA: Diagnosis not present

## 2023-05-01 DIAGNOSIS — J90 Pleural effusion, not elsewhere classified: Secondary | ICD-10-CM | POA: Diagnosis not present

## 2023-05-01 DIAGNOSIS — D62 Acute posthemorrhagic anemia: Secondary | ICD-10-CM | POA: Diagnosis not present

## 2023-05-01 DIAGNOSIS — R5381 Other malaise: Secondary | ICD-10-CM | POA: Diagnosis not present

## 2023-05-01 DIAGNOSIS — I10 Essential (primary) hypertension: Secondary | ICD-10-CM | POA: Diagnosis not present

## 2023-05-01 DIAGNOSIS — Z85118 Personal history of other malignant neoplasm of bronchus and lung: Secondary | ICD-10-CM | POA: Diagnosis not present

## 2023-05-01 DIAGNOSIS — E785 Hyperlipidemia, unspecified: Secondary | ICD-10-CM | POA: Diagnosis not present

## 2023-05-01 DIAGNOSIS — M6281 Muscle weakness (generalized): Secondary | ICD-10-CM | POA: Diagnosis not present

## 2023-05-01 DIAGNOSIS — D539 Nutritional anemia, unspecified: Secondary | ICD-10-CM | POA: Diagnosis not present

## 2023-05-01 DIAGNOSIS — Z79899 Other long term (current) drug therapy: Secondary | ICD-10-CM | POA: Diagnosis not present

## 2023-05-01 DIAGNOSIS — J449 Chronic obstructive pulmonary disease, unspecified: Secondary | ICD-10-CM | POA: Diagnosis not present

## 2023-05-01 DIAGNOSIS — R262 Difficulty in walking, not elsewhere classified: Secondary | ICD-10-CM | POA: Diagnosis not present

## 2023-05-03 DIAGNOSIS — I10 Essential (primary) hypertension: Secondary | ICD-10-CM | POA: Diagnosis not present

## 2023-05-03 DIAGNOSIS — R52 Pain, unspecified: Secondary | ICD-10-CM | POA: Diagnosis not present

## 2023-05-03 DIAGNOSIS — R6520 Severe sepsis without septic shock: Secondary | ICD-10-CM | POA: Diagnosis not present

## 2023-05-03 DIAGNOSIS — I480 Paroxysmal atrial fibrillation: Secondary | ICD-10-CM | POA: Diagnosis not present

## 2023-05-03 DIAGNOSIS — E785 Hyperlipidemia, unspecified: Secondary | ICD-10-CM | POA: Diagnosis not present

## 2023-05-03 DIAGNOSIS — A419 Sepsis, unspecified organism: Secondary | ICD-10-CM | POA: Diagnosis not present

## 2023-05-03 DIAGNOSIS — R627 Adult failure to thrive: Secondary | ICD-10-CM | POA: Diagnosis not present

## 2023-05-03 DIAGNOSIS — E43 Unspecified severe protein-calorie malnutrition: Secondary | ICD-10-CM | POA: Diagnosis not present

## 2023-05-03 DIAGNOSIS — K5900 Constipation, unspecified: Secondary | ICD-10-CM | POA: Diagnosis not present

## 2023-05-03 DIAGNOSIS — L449 Papulosquamous disorder, unspecified: Secondary | ICD-10-CM | POA: Diagnosis not present

## 2023-05-03 DIAGNOSIS — I69822 Dysarthria following other cerebrovascular disease: Secondary | ICD-10-CM | POA: Diagnosis not present

## 2023-05-04 ENCOUNTER — Ambulatory Visit: Payer: 59 | Admitting: Family Medicine

## 2023-05-04 ENCOUNTER — Telehealth: Payer: Self-pay | Admitting: Medical Oncology

## 2023-05-04 ENCOUNTER — Inpatient Hospital Stay: Payer: 59 | Attending: Internal Medicine | Admitting: Internal Medicine

## 2023-05-04 VITALS — HR 94 | Temp 98.4°F | Resp 16

## 2023-05-04 DIAGNOSIS — I10 Essential (primary) hypertension: Secondary | ICD-10-CM | POA: Diagnosis not present

## 2023-05-04 DIAGNOSIS — Z79899 Other long term (current) drug therapy: Secondary | ICD-10-CM | POA: Diagnosis not present

## 2023-05-04 DIAGNOSIS — Z87891 Personal history of nicotine dependence: Secondary | ICD-10-CM | POA: Insufficient documentation

## 2023-05-04 DIAGNOSIS — J90 Pleural effusion, not elsewhere classified: Secondary | ICD-10-CM | POA: Diagnosis not present

## 2023-05-04 DIAGNOSIS — C3490 Malignant neoplasm of unspecified part of unspecified bronchus or lung: Secondary | ICD-10-CM

## 2023-05-04 DIAGNOSIS — D539 Nutritional anemia, unspecified: Secondary | ICD-10-CM | POA: Diagnosis not present

## 2023-05-04 DIAGNOSIS — K5900 Constipation, unspecified: Secondary | ICD-10-CM | POA: Diagnosis not present

## 2023-05-04 DIAGNOSIS — R627 Adult failure to thrive: Secondary | ICD-10-CM | POA: Diagnosis not present

## 2023-05-04 DIAGNOSIS — L449 Papulosquamous disorder, unspecified: Secondary | ICD-10-CM | POA: Diagnosis not present

## 2023-05-04 DIAGNOSIS — C3432 Malignant neoplasm of lower lobe, left bronchus or lung: Secondary | ICD-10-CM | POA: Insufficient documentation

## 2023-05-04 DIAGNOSIS — R52 Pain, unspecified: Secondary | ICD-10-CM | POA: Diagnosis not present

## 2023-05-04 DIAGNOSIS — E785 Hyperlipidemia, unspecified: Secondary | ICD-10-CM | POA: Diagnosis not present

## 2023-05-04 DIAGNOSIS — A419 Sepsis, unspecified organism: Secondary | ICD-10-CM | POA: Diagnosis not present

## 2023-05-04 DIAGNOSIS — E43 Unspecified severe protein-calorie malnutrition: Secondary | ICD-10-CM | POA: Diagnosis not present

## 2023-05-04 DIAGNOSIS — I69822 Dysarthria following other cerebrovascular disease: Secondary | ICD-10-CM | POA: Diagnosis not present

## 2023-05-04 DIAGNOSIS — I480 Paroxysmal atrial fibrillation: Secondary | ICD-10-CM | POA: Diagnosis not present

## 2023-05-04 DIAGNOSIS — R6520 Severe sepsis without septic shock: Secondary | ICD-10-CM | POA: Diagnosis not present

## 2023-05-04 MED ORDER — METHYLPREDNISOLONE 4 MG PO TBPK: ORAL_TABLET | ORAL | 0 refills | Status: AC

## 2023-05-04 NOTE — Telephone Encounter (Signed)
 Preferred pharmacy entered in EPIC.

## 2023-05-04 NOTE — Progress Notes (Signed)
 [facility] [phone]   [phone]  OFFICE PROGRESS NOTE  [name], MD [address] [place] [address]  DIAGNOSIS: limited stage (T2a, N2, M0) small cell lung cancer presented with left lower lobe lung mass in addition to left hilar and mediastinal lymphadenopathy diagnosed in April 2024.   PRIOR THERAPY: Systemic therapy with carboplatin for AUC of 5 on day 1 and etoposide 100 Mg/M2 on days 1, 2 and 3 every 3 weeks concurrent with radiation.  She is status post 4 cycles.  She declined prophylactic cranial irradiation.  CURRENT THERAPY: Observation.  INTERVAL HISTORY: Sheena [name] 78 y.o. female returns to the clinic today for follow-up visit accompanied by her son.Discussed the use of AI scribe software for clinical note transcription with the patient, who gave verbal consent to proceed.  History of Present Illness   Sheena [name] is a 78 year old female with small cell lung cancer who presents with recent hospitalization for pneumonia and breathing issues. She is accompanied by her friend, Sheena [name].  She has a history of small cell lung cancer, previously treated with carboplatin and etoposide chemotherapy. Initially seen in April 2024 for cancer treatment, she underwent chemotherapy without prophylactic cranial irradiation. An MRI of the brain showed no evidence of metastasis. Recent imaging and fluid analysis have shown no evidence of cancer recurrence.  In late January 2025, she experienced a fall in her kitchen, resulting in a right hip fracture, for which she underwent surgery. Post-surgery, she was admitted to a rehabilitation facility. Less than two weeks ago, she was hospitalized due to severe pneumonia, characterized by significant shortness of breath. During her hospital stay, a CT angiogram of the chest revealed lung inflammation and pleural effusion, which was drained twice, yielding 400 cc each time. The  fluid was tested for cancer, with negative results.  Currently, she is back in the rehabilitation facility, continuing with physical therapy. She experiences occasional shortness of breath and uses supplemental oxygen intermittently, noting that her oxygen saturation sometimes drops below 90%. No chest pain or hemoptysis, but she has occasional coughing with phlegm production. She is taking Mucinex for her cough.  She has experienced a weight loss of approximately 10 to 15 pounds and is starting medication to help with her appetite. Sheena [name], her friend, notes concerns about her eating habits and weight loss. He also mentions that her oxygen levels tend to drop when she is off supplemental oxygen.  She is undergoing physical therapy to regain strength and mobility following right hip surgery due to a fracture.       MEDICAL HISTORY: Past Medical History:  Diagnosis Date   Anxiety    Heart murmur    Hypertension    Primary small cell carcinoma of lower lobe of left lung (HCC)    Scarlet fever with other complications childhood   "had to learn to work again" has had leg weakness    ALLERGIES:  is allergic to antihistamines, diphenhydramine-type; aspirin; and evista [raloxifene].  MEDICATIONS:  Current Outpatient Medications  Medication Sig Dispense Refill   acetaminophen (TYLENOL) 325 MG tablet Take 2 tablets (650 mg total) by mouth every 6 (six) hours as needed for mild pain (pain score 1-3) (or Fever >/= 101).     amiodarone (PACERONE) 200 MG tablet Take 1 tablet (200 mg total) by mouth daily. 90 tablet 3   aspirin EC 81 MG tablet Take 1 tablet (81 mg total) by mouth daily.  Swallow whole. 30 tablet 0   atorvastatin (LIPITOR) 20 MG tablet TAKE 1 TABLET BY MOUTH EVERY DAY (Patient taking differently: Take 20 mg by mouth every evening.) 90 tablet 3   Calcium Carb-Cholecalciferol (OYSTER SHELL CALCIUM/VIT D3) 500-5 MG-MCG TABS Take 1 tablet by mouth in the morning and at bedtime.     Calcium  Citrate-Vitamin D (CALCIUM CITRATE PETITE/VIT D PO) Take 1 tablet by mouth 2 (two) times daily. 250mg - (200 unit)     cyanocobalamin 1000 MCG tablet Take 1 tablet (1,000 mcg total) by mouth daily.     docusate sodium (COLACE) 100 MG capsule Take 1 capsule (100 mg total) by mouth 2 (two) times daily as needed for mild constipation.     folic acid (FOLVITE) 1 MG tablet Take 1 tablet (1 mg total) by mouth daily.     furosemide (LASIX) 20 MG tablet Take 0.5 tablets (10 mg total) by mouth daily.     lactose free nutrition (BOOST) LIQD Take 237 mLs by mouth in the morning and at bedtime.     magnesium oxide (MAG-OX) 400 MG tablet Take 1 tablet by mouth daily.     methocarbamol (ROBAXIN) 500 MG tablet Take 1 tablet (500 mg total) by mouth every 6 (six) hours as needed for muscle spasms. 28 tablet 0   oxyCODONE (OXY IR/ROXICODONE) 5 MG immediate release tablet Take 1-2 tablets (5-10 mg total) by mouth every 6 (six) hours as needed for severe pain (pain score 7-10) or moderate pain (pain score 4-6). 30 tablet 0   prochlorperazine (COMPAZINE) 10 MG tablet Take 1 tablet (10 mg total) by mouth every 6 (six) hours as needed for nausea or vomiting. 30 tablet 2   No current facility-administered medications for this visit.    SURGICAL HISTORY:  Past Surgical History:  Procedure Laterality Date   BRONCHIAL NEEDLE ASPIRATION BIOPSY  06/30/2022   Procedure: BRONCHIAL NEEDLE ASPIRATION BIOPSIES;  Surgeon: [name], DO;  Location: [facility];  Service: Pulmonary;;   INTRAMEDULLARY (IM) NAIL INTERTROCHANTERIC Right 04/02/2023   Procedure: RIGHT INTRAMEDULLARY (IM) NAIL INTERTROCHANTERIC;  Surgeon: Roby Lofts, MD;  Location: [facility] OR;  Service: Orthopedics;  Laterality: Right;   IR IMAGING GUIDED PORT INSERTION  07/31/2022   IR KYPHO LUMBAR INC FX REDUCE BONE BX UNI/BIL CANNULATION INC/IMAGING  12/17/2022   IR US GUIDE VASC ACCESS LEFT  12/17/2022   KYPHOPLASTY     2014   peridontal surgery      VIDEO BRONCHOSCOPY WITH ENDOBRONCHIAL ULTRASOUND N/A 06/30/2022   Procedure: VIDEO BRONCHOSCOPY WITH ENDOBRONCHIAL ULTRASOUND;  Surgeon: [name], DO;  Location: [facility];  Service: Pulmonary;  Laterality: N/A;    REVIEW OF SYSTEMS:  Constitutional: positive for anorexia, fatigue, and weight loss Eyes: negative Ears, nose, mouth, throat, and face: negative Respiratory: positive for cough and dyspnea on exertion Cardiovascular: negative Gastrointestinal: negative Genitourinary:negative Integument/breast: negative Hematologic/lymphatic: negative Musculoskeletal:positive for back pain and muscle weakness Neurological: negative Behavioral/Psych: negative Endocrine: negative Allergic/Immunologic: negative   PHYSICAL EXAMINATION: General appearance: alert, cooperative, fatigued, and no distress Head: Normocephalic, without obvious abnormality, atraumatic Neck: no adenopathy, no JVD, supple, symmetrical, trachea midline, and thyroid not enlarged, symmetric, no tenderness/mass/nodules Lymph nodes: Cervical, supraclavicular, and axillary nodes normal. Resp: clear to auscultation bilaterally Back: symmetric, no curvature. ROM normal. No CVA tenderness. Cardio: regular rate and rhythm, S1, S2 normal, no murmur, click, rub or gallop GI: soft, non-tender; bowel sounds normal; no masses,  no organomegaly Extremities: extremities normal, atraumatic, no cyanosis or  edema Neurologic: Alert and oriented X 3, normal strength and tone. Normal symmetric reflexes. Normal coordination and gait  ECOG PERFORMANCE STATUS: 1 - Symptomatic but completely ambulatory  Pulse 94, temperature 98.4 F (36.9 C), temperature source Temporal, resp. rate 16, SpO2 94%.  LABORATORY DATA: Lab Results  Component Value Date   WBC 7.0 04/28/2023   HGB 9.4 (L) 04/28/2023   HCT 31.0 (L) 04/28/2023   MCV 106.5 (H) 04/28/2023   PLT 165 04/28/2023      Chemistry      Component Value Date/Time   NA 133 (L)  04/28/2023 0424   K 3.9 04/28/2023 0424   CL 91 (L) 04/28/2023 0424   CO2 30 04/28/2023 0424   BUN 20 04/28/2023 0424   CREATININE 1.20 (H) 04/28/2023 0424   CREATININE 1.03 (H) 01/22/2023 1304   CREATININE 1.18 (H) 02/19/2020 1048      Component Value Date/Time   CALCIUM 8.8 (L) 04/28/2023 0424   ALKPHOS 104 04/28/2023 0424   AST 53 (H) 04/28/2023 0424   AST 25 01/22/2023 1304   ALT 32 04/28/2023 0424   ALT 16 01/22/2023 1304   BILITOT 0.7 04/28/2023 0424   BILITOT 0.4 01/22/2023 1304       RADIOGRAPHIC STUDIES: DG CHEST PORT 1 VIEW Result Date: 04/29/2023 CLINICAL DATA:  [id number] Pleural effusion [id number] EXAM: PORTABLE CHEST 1 VIEW COMPARISON:  February Twenty-fifth, 2025 February seventeenth 2025 FINDINGS: The cardiomediastinal silhouette is unchanged in contour RIGHT-sided shift and volume loss. Similar fullness of the bilateral hila.RIGHT chest port tip terminating the superior cavoatrial junction. Small layering LEFT pleural effusion, similar in comparison to prior. No pneumothorax. Bibasilar opacities appears similar in comparison to prior. Background of reticular opacities likely reflecting underlying emphysema. Status post vertebral augmentation of the lower thoracic spine. Atherosclerotic calcifications. IMPRESSION: Similar appearance of small residual layering LEFT pleural effusion with bibasilar opacities. Electronically Signed   By: Meda Klinefelter M.D.   On: 04/29/2023 07:55   DG Lumbar Spine 2-3 Views Result Date: 04/27/2023 CLINICAL DATA:  Shortness of breath with back pain and limited range of motion. EXAM: THORACIC SPINE 2 VIEWS; LUMBAR SPINE - 2-3 VIEW COMPARISON:  Chest CTA 04/19/2023, CT cervical spine 03/31/2023, CT thoracic spine 03/07/2022 and CT lumbar spine 12/09/2022. FINDINGS: Thoracic spine: The bones are diffusely demineralized. Patient is status post spinal augmentation at T12 and L1. There are chronic compression fractures at T5, T8 and T11 which are grossly  unchanged. No definite new fractures identified. Lumbar spine: As above, previous spinal augmentation at L1. Grossly stable superior endplate compression deformities at L2, L3 and L5, suboptimally evaluated due to obliquity on the lateral view and underlying osteopenia. No definite new fractures identified. Previous proximal right femoral ORIF. Diffuse aortoiliac atherosclerosis. IMPRESSION: 1. No definite acute osseous findings identified in the thoracic or lumbar spine. Evaluation limited by osseous demineralization and rotation. 2. Multiple chronic compression fractures in the thoracic and lumbar spine as described. Previous spinal augmentation at T12 and L1. Electronically Signed   By: Carey Bullocks M.D.   On: 04/27/2023 16:44   DG Thoracic Spine 2 View Result Date: 04/27/2023 CLINICAL DATA:  Shortness of breath with back pain and limited range of motion. EXAM: THORACIC SPINE 2 VIEWS; LUMBAR SPINE - 2-3 VIEW COMPARISON:  Chest CTA 04/19/2023, CT cervical spine 03/31/2023, CT thoracic spine 03/07/2022 and CT lumbar spine 12/09/2022. FINDINGS: Thoracic spine: The bones are diffusely demineralized. Patient is status post spinal augmentation at T12 and L1. There are chronic compression  fractures at T5, T8 and T11 which are grossly unchanged. No definite new fractures identified. Lumbar spine: As above, previous spinal augmentation at L1. Grossly stable superior endplate compression deformities at L2, L3 and L5, suboptimally evaluated due to obliquity on the lateral view and underlying osteopenia. No definite new fractures identified. Previous proximal right femoral ORIF. Diffuse aortoiliac atherosclerosis. IMPRESSION: 1. No definite acute osseous findings identified in the thoracic or lumbar spine. Evaluation limited by osseous demineralization and rotation. 2. Multiple chronic compression fractures in the thoracic and lumbar spine as described. Previous spinal augmentation at T12 and L1. Electronically Signed    By: Carey Bullocks M.D.   On: 04/27/2023 16:44   DG Chest 2 View Result Date: 04/27/2023 CLINICAL DATA:  Back pain and shortness of breath. Patient has stiffness EXAM: CHEST - 2 VIEW COMPARISON:  X-ray 04/26/2023. FINDINGS: Hyperinflation. Osteopenia. Right IJ chest port with tip along the central SVC. Bilateral small pleural effusions with adjacent opacities. Left-greater-than-right. No pneumothorax or edema. Normal cardiopericardial silhouette. Film is rotated to the right. There is some bandlike changes right perihilar, likely scar or atelectasis versus thickening along the minor fissure. Degenerative changes of the spine. Multilevel compression deformities with areas of augmentation cement as on prior. IMPRESSION: Rotated radiograph. Bilateral small pleural effusions with adjacent opacities, left-greater-than-right. Recommend follow-up. Right IJ chest port. Compression deformities along the spine with osteopenia. Augmentation cement at 2 levels of the lower thoracic spine. Please correlate with history Electronically Signed   By: Karen Kays M.D.   On: 04/27/2023 16:36   DG Chest 1 View Result Date: 04/26/2023 CLINICAL DATA:  [id number] S/P thoracentesis [id number] EXAM: PORTABLE CHEST 1 VIEW COMPARISON:  Chest XR, 04/25/2023 and 04/19/2023. CT chest, 04/19/2023. FINDINGS: Support lines: RIGHT chest port with catheter tip within the SVC. Cardiac silhouette is within normal limits. Aortic arch atherosclerosis. Hyperinflation with relative flattening of the diaphragm. The RIGHT lung is clear. Improved aeration of the LEFT lung post thoracentesis with no pneumothorax or residual pleural effusion. Trace patchy retrocardiac opacity. Thoracolumbar vertebral augmentation change. No interval osseous abnormality. IMPRESSION: 1. Improved aeration of the LEFT lung post thoracentesis. No pneumothorax or residual pleural effusion. 2. Aortic Atherosclerosis (ICD10-I70.0) and Emphysema (ICD10-J43.9). Electronically Signed    By: Roanna Banning M.D.   On: 04/26/2023 14:34   US THORACENTESIS ASP PLEURAL SPACE W/IMG GUIDE Result Date: 04/26/2023 INDICATION: 78 year old female with pneumonia and left pleural effusion. EXAM: ULTRASOUND GUIDED DIAGNOSTIC AND THERAPEUTIC LEFT THORACENTESIS MEDICATIONS: 10 mL 1% lidocaine COMPLICATIONS: None immediate. PROCEDURE: An ultrasound guided thoracentesis was thoroughly discussed with the patient and questions answered. The benefits, risks, alternatives and complications were also discussed. The patient understands and wishes to proceed with the procedure. Written consent was obtained. Ultrasound was performed to localize and mark an adequate pocket of fluid in the LEFT chest. The area was then prepped and draped in the normal sterile fashion. 1% Lidocaine was used for local anesthesia. Under ultrasound guidance a 6 Fr Safe-T-Centesis catheter was introduced. Thoracentesis was performed. The catheter was removed and a dressing applied. FINDINGS: A total of approximately 370 mL of serosanguineous fluid was removed. Samples were sent to the laboratory as requested by the clinical team. IMPRESSION: Successful ultrasound guided diagnostic and therapeutic LEFT thoracentesis yielding 370 mL of pleural fluid. Roanna Banning, MD [facility] Electronically Signed   By: Roanna Banning M.D.   On: 04/26/2023 14:30   DG Chest 2 View Result Date: 04/25/2023 CLINICAL DATA:  Shortness of breath and flu-like symptoms EXAM: CHEST - 2 VIEW COMPARISON:  Chest radiograph dated 04/19/2023 FINDINGS: Lines/tubes: Right chest wall port tip projects over the SVC. Lungs: Patient is rotated to the right. Hazy left lower lung opacity. Pleura: No pneumothorax.  Layering large left pleural effusion. Heart/mediastinum: Left heart border is obscured. Bones: Compression deformities of the thoracic spine with vertebral augmentation. IMPRESSION: Layering large left pleural  effusion with associated left lower lung atelectasis. Electronically Signed   By: Agustin Cree M.D.   On: 04/25/2023 17:48   DG Knee 1-2 Views Right Result Date: 04/24/2023 CLINICAL DATA:  Pain since surgery 04/02/2023. EXAM: RIGHT KNEE - 1-2 VIEW COMPARISON:  Right knee radiographs 03/31/2023 FINDINGS: There is diffuse decreased bone mineralization. Minimal chronic enthesopathic change at the quadriceps insertion on the patella. Tiny joint effusion. No significant joint space narrowing. No acute fracture or dislocation. Mild atherosclerotic calcifications. IMPRESSION: Tiny joint effusion. No acute fracture. Electronically Signed   By: Neita Garnet M.D.   On: 04/24/2023 16:09   DG HIP UNILAT WITH PELVIS 2-3 VIEWS RIGHT Result Date: 04/24/2023 CLINICAL DATA:  Right hip pain after surgery 04/02/2023. EXAM: DG HIP (WITH OR WITHOUT PELVIS) 2-3V RIGHT COMPARISON:  Pelvis and right hip radiographs 04/02/2023 FINDINGS: Redemonstration of cephalomedullary nail fixation of proximal femoral intertrochanteric fracture. No perihardware lucency is seen to indicate hardware failure or loosening. Unchanged alignment. There is again approximately 1 cm medial displacement of the right lesser trochanter. Mild bilateral sacroiliac subchondral sclerosis and minimal superior pubic symphysis joint space narrowing. IMPRESSION: Unchanged alignment of proximal femoral intertrochanteric fracture status post cephalomedullary nail fixation. No evidence of hardware failure. Electronically Signed   By: Neita Garnet M.D.   On: 04/24/2023 16:08   MR BRAIN W WO CONTRAST Result Date: 04/21/2023 CLINICAL DATA:  Small cell lung carcinoma EXAM: MRI HEAD WITHOUT AND WITH CONTRAST TECHNIQUE: Multiplanar, multiecho pulse sequences of the brain and surrounding structures were obtained without and with intravenous contrast. CONTRAST:  4mL GADAVIST GADOBUTROL 1 MMOL/ML IV SOLN COMPARISON:  None Available. FINDINGS: Brain: No acute infarct, mass  effect or extra-axial collection. No acute or chronic hemorrhage. There is multifocal hyperintense T2-weighted signal within the white matter. Parenchymal volume and CSF spaces are normal. The midline structures are normal. There is no abnormal contrast enhancement. Vascular: Normal flow voids. Skull and upper cervical spine: Normal calvarium and skull base. Visualized upper cervical spine and soft tissues are normal. Sinuses/Orbits:No paranasal sinus fluid levels or advanced mucosal thickening. No mastoid or middle ear effusion. Normal orbits. IMPRESSION: 1. No intracranial metastatic disease. 2. Findings of chronic small vessel ischemia. Electronically Signed   By: Deatra [name] M.D.   On: 04/21/2023 00:19   ECHOCARDIOGRAM COMPLETE Result Date: 04/20/2023    ECHOCARDIOGRAM REPORT   Patient Name:   Sheena [name] [facility] Date of Exam: 04/20/2023 Medical Rec #:  [id number]             Height:       60.0 in Accession #:    [id number]            Weight:       93.3 lb Date of Birth:  16-Jun-1945             BSA:          1.350 m Patient Age:    77 years              BP:  100/49 mmHg Patient Gender: F                     HR:           81 bpm. Exam Location:  Inpatient Procedure: 2D Echo, Cardiac Doppler and Color Doppler (Both Spectral and Color            Flow Doppler were utilized during procedure). Indications:    Other abnormalities of the heart R00.8  History:        Patient has prior history of Echocardiogram examinations, most                 recent 09/11/2022. Arrythmias:Atrial Fibrillation;                 Signs/Symptoms:Hypotension and Murmur.  Sonographer:    Webb Laws Referring Phys: Marron.Salles DAVID GIRGUIS IMPRESSIONS  1. Left ventricular ejection fraction, by estimation, is 65 to 70%. The left ventricle has normal function. The left ventricle has no regional wall motion abnormalities. Left ventricular diastolic parameters are indeterminate. Elevated left atrial pressure.  2. Right ventricular  systolic function is hyperdynamic. The right ventricular size is normal. Tricuspid regurgitation signal is inadequate for assessing PA pressure.  3. The mitral valve is normal in structure. No evidence of mitral valve regurgitation. No evidence of mitral stenosis.  4. The aortic valve is tricuspid. Aortic valve regurgitation is trivial. No aortic stenosis is present.  5. The inferior vena cava is dilated in size with <50% respiratory variability, suggesting right atrial pressure of 15 mmHg. Comparison(s): Prior images reviewed side by side. IVC more dilated. FINDINGS  Left Ventricle: Left ventricular ejection fraction, by estimation, is 65 to 70%. The left ventricle has normal function. The left ventricle has no regional wall motion abnormalities. Strain imaging was not performed. The left ventricular internal cavity  size was normal in size. There is no left ventricular hypertrophy. Left ventricular diastolic parameters are indeterminate. Elevated left atrial pressure. Right Ventricle: The right ventricular size is normal. No increase in right ventricular wall thickness. Right ventricular systolic function is hyperdynamic. Tricuspid regurgitation signal is inadequate for assessing PA pressure. Left Atrium: Left atrial size was normal in size. Right Atrium: Right atrial size was not well visualized. Pericardium: There is no evidence of pericardial effusion. Mitral Valve: The mitral valve is normal in structure. No evidence of mitral valve regurgitation. No evidence of mitral valve stenosis. Tricuspid Valve: The tricuspid valve is normal in structure. Tricuspid valve regurgitation is not demonstrated. No evidence of tricuspid stenosis. Aortic Valve: The aortic valve is tricuspid. Aortic valve regurgitation is trivial. Aortic regurgitation PHT measures 314 msec. No aortic stenosis is present. Aortic valve peak gradient measures 15.9 mmHg. Pulmonic Valve: The pulmonic valve was not well visualized. Pulmonic valve  regurgitation is not visualized. No evidence of pulmonic stenosis. Aorta: The aortic root and ascending aorta are structurally normal, with no evidence of dilitation. Venous: The inferior vena cava is dilated in size with less than 50% respiratory variability, suggesting right atrial pressure of 15 mmHg. IAS/Shunts: The atrial septum is grossly normal. Additional Comments: 3D imaging was not performed.  LEFT VENTRICLE PLAX 2D LVIDd:         2.90 cm   Diastology LVIDs:         2.50 cm   LV e' medial:    6.85 cm/s LV PW:         1.00 cm   LV E/e' medial:  17.8 LV IVS:  1.10 cm   LV e' lateral:   7.18 cm/s LVOT diam:     1.60 cm   LV E/e' lateral: 17.0 LV SV:         56 LV SV Index:   41 LVOT Area:     2.01 cm  RIGHT VENTRICLE             IVC RV Basal diam:  3.40 cm     IVC diam: 2.10 cm RV S prime:     10.10 cm/s TAPSE (M-mode): 2.5 cm LEFT ATRIUM             Index        RIGHT ATRIUM           Index LA diam:        3.40 cm 2.52 cm/m   RA Area:     10.40 cm LA Vol (A2C):   44.0 ml 32.59 ml/m  RA Volume:   23.10 ml  17.11 ml/m LA Vol (A4C):   40.0 ml 29.63 ml/m LA Biplane Vol: 43.0 ml 31.85 ml/m  AORTIC VALVE AV Area (Vmax): 1.21 cm AV Vmax:        199.50 cm/s AV Peak Grad:   15.9 mmHg LVOT Vmax:      120.00 cm/s LVOT Vmean:     84.300 cm/s LVOT VTI:       0.277 m AI PHT:         314 msec  AORTA Ao Root diam: 2.10 cm Ao Asc diam:  2.80 cm MITRAL VALVE MV Area (PHT): 4.21 cm     SHUNTS MV Decel Time: 180 msec     Systemic VTI:  0.28 m MV E velocity: 122.00 cm/s  Systemic Diam: 1.60 cm MV A velocity: 132.00 cm/s MV E/A ratio:  0.92 Riley Lam MD Electronically signed by Riley Lam MD Signature Date/Time: 04/20/2023/12:52:37 PM    Final    DG Chest 1 View Result Date: 04/19/2023 CLINICAL DATA:  Status post left-sided thoracentesis EXAM: CHEST  1 VIEW COMPARISON:  04/18/2023 FINDINGS: Right Port-A-Cath tip at superior caval/atrial junction. Patient rotated left. Mild cardiomegaly.  Atherosclerosis in the transverse aorta. Resolved left pleural effusion. No pneumothorax. There may be trace right pleural fluid. Mild interstitial prominence and indistinctness. Improved right base aeration with resolution of previously described right lower and right middle lobe collapse. Persistent left lower lobe airspace disease with volume loss. Lower thoracic vertebral augmentation. IMPRESSION: No pneumothorax after left-sided thoracentesis. Improved right and persistent left lower lung airspace disease. Cardiomegaly with mild interstitial edema. Electronically Signed   By: Jeronimo Greaves M.D.   On: 04/19/2023 17:49   US THORACENTESIS ASP PLEURAL SPACE W/IMG GUIDE Result Date: 04/19/2023 INDICATION: 78 year old woman with pneumonia and pleural effusion. Diagnostic thoracentesis. EXAM: ULTRASOUND GUIDED LEFT THORACENTESIS MEDICATIONS: 10 mL 1% lidocaine COMPLICATIONS: None immediate. PROCEDURE: An ultrasound guided thoracentesis was thoroughly discussed with the patient and questions answered. The benefits, risks, alternatives and complications were also discussed. The patient understands and wishes to proceed with the procedure. Written consent was obtained. Ultrasound was performed to localize and mark an adequate pocket of fluid in the Left chest. The area was then prepped and draped in the normal sterile fashion. 1% Lidocaine was used for local anesthesia. Under ultrasound guidance a catheter was introduced. Thoracentesis was performed. The catheter was removed and a dressing applied. FINDINGS: A total of approximately 400 mL of yellow fluid was removed. Samples were sent to the laboratory as requested by  the clinical team. IMPRESSION: Successful ultrasound guided left thoracentesis yielding 400 mL of pleural fluid. Performed By Theresa Mulligan, PA-C Electronically Signed   By: Corlis Leak M.D.   On: 04/19/2023 15:55   CT Angio Chest PE W/Cm &/Or Wo Cm Result Date: 04/19/2023 CLINICAL DATA:  Increasing  oxygen requirement EXAM: CT ANGIOGRAPHY CHEST WITH CONTRAST TECHNIQUE: Multidetector CT imaging of the chest was performed using the standard protocol during bolus administration of intravenous contrast. Multiplanar CT image reconstructions and MIPs were obtained to evaluate the vascular anatomy. RADIATION DOSE REDUCTION: This exam was performed according to the departmental dose-optimization program which includes automated exposure control, adjustment of the mA and/or kV according to patient size and/or use of iterative reconstruction technique. CONTRAST:  80mL OMNIPAQUE IOHEXOL 350 MG/ML SOLN COMPARISON:  Chest x-ray from earlier in the same day, CT from 01/30/2023 FINDINGS: Cardiovascular: Atherosclerotic calcifications of the thoracic aorta are noted without aneurysmal dilatation or dissection. Coronary calcifications are noted. No cardiac enlargement is seen. Right chest wall port is noted. Pulmonary artery shows a normal branching pattern bilaterally. No intraluminal filling defect to suggest pulmonary embolism is noted. Mediastinum/Nodes: Thoracic inlet is within normal limits. No hilar or mediastinal adenopathy is noted. The esophagus as visualized is within normal limits. Lungs/Pleura: Large left-sided pleural effusion is noted with mild left lower lobe consolidation. Increased in the interval from the prior exam. The right lung demonstrates inspissated material within the right lower lobe bronchial tree with right lower lobe collapse similar to that seen on prior plain film examination. Right middle lobe collapse is noted secondary to the inspissated material is well. This may represent a large mucous plug or possible aspirated material. Upper Abdomen: Visualized upper abdomen shows no acute abnormality. Musculoskeletal: No acute rib abnormality is noted. Multilevel chronic thoracic compression deformities are noted with evidence of vertebral augmentation at T12 and L1. These changes are stable from the  prior exam. Review of the MIP images confirms the above findings. IMPRESSION: No evidence of pulmonary emboli. Inspissated material within the right middle and lower lobe bronchial tree with collapse of both lobes identified. Left-sided pleural effusion with associated left lower lobe consolidation. Chronic compression deformities some with evidence of prior augmentation. Aortic Atherosclerosis (ICD10-I70.0). Electronically Signed   By: Alcide Clever M.D.   On: 04/19/2023 00:54   DG Chest Port 1 View Result Date: 04/18/2023 CLINICAL DATA:  Sepsis EXAM: PORTABLE CHEST 1 VIEW COMPARISON:  03/31/2023 FINDINGS: There is right middle and lower lobe collapse with elevation of the right hemidiaphragm and right-sided volume loss. Right upper lobe is clear. Left lung appears hyperinflated, in keeping with changes of underlying COPD. Left pleural effusion is suspected. Right internal jugular chest port tip is seen within the superior vena cava. There is mediastinal shift to the right with obscuration of the cardiac silhouette. No acute bone abnormality. Multilevel vertebroplasty and numerous midthoracic compression deformities again noted. IMPRESSION: 1. Right middle and lower lobe collapse with elevation of the right hemidiaphragm and right-sided volume loss. Correlation for a central obstructing lesion, such as a mucous plug or aspirated foreign is recommended given its relatively rapid development. 2. COPD. 3. Suspected left pleural effusion. Electronically Signed   By: Helyn Numbers M.D.   On: 04/18/2023 22:28   DG Foot Complete Right Result Date: 04/06/2023 CLINICAL DATA:  RIGHT foot pain EXAM: RIGHT FOOT COMPLETE - 3+ VIEW COMPARISON:  None Available. FINDINGS: No fracture or dislocation of mid foot or forefoot. The phalanges are normal. The calcaneus  is normal. No soft tissue abnormality. IMPRESSION: No fracture or dislocation. Electronically Signed   By: Genevive Bi M.D.   On: 04/06/2023 09:03     ASSESSMENT AND PLAN: This is a very pleasant 78 years old white female with limited stage (T2a, N2, M0) small cell lung cancer presented with left lower lobe lung mass in addition to left hilar and mediastinal lymphadenopathy diagnosed in April 2024.  The patient underwent a course of systemic chemotherapy with carboplatin for AUC of 5 on day 1 and 2 etoposide 100 Mg/M2 on days 1, 2 and 3 concurrent with radiation.  She is status post 4 cycles. The patient is currently on observation.     Small cell lung cancer Small cell lung cancer treated with carboplatin and etoposide chemotherapy, with a good response. Prophylactic cranial irradiation was performed. Recent imaging shows no evidence of cancer recurrence in the brain or chest. Current concern for possible recurrence due to new pleural effusion, but fluid tested negative for cancer. PET scan is planned to further assess for recurrence. - Order PET scan in three weeks to assess for cancer recurrence - Consider Medrol dose pack to stimulate appetite and increase energy  Pleural effusion Pleural effusion on the left side, drained twice with 400 cc removed each time. Fluid tested negative for cancer. Possible causes include infection or heart condition, but no definitive evidence of cancer recurrence. Concern remains for potential cancer-related cause, but no proof currently.  Pneumonia Recent hospitalization for pneumonia with significant shortness of breath. CT angiogram showed lung inflammation. Currently in rehabilitation with intermittent oxygen use. No evidence of cancer in the fluid or on imaging. - Prescribe Mucinex for cough management  Weight loss Reported weight loss of 10-15 pounds, likely due to decreased appetite. Currently addressing insurance issues to start appetite-stimulating medication. Medrol dose pack considered to help with appetite and energy. - Consider Medrol dose pack to stimulate appetite  Hip fracture Right hip  fracture following a fall in January, treated with surgery. Currently undergoing rehabilitation therapy.  Follow-up Plan for follow-up to monitor health status and response to treatment. PET scan planned in three weeks to assess for cancer recurrence. - Schedule follow-up appointment in four weeks after PET scan   The patient was advised to call immediately if she has any other concerning symptoms in the interval. The patient voices understanding of current disease status and treatment options and is in agreement with the current care plan. All questions were answered. The patient knows to call the clinic with any problems, questions or concerns. We can certainly see the patient much sooner if necessary.  The total time spent in the appointment was 30 minutes.  Disclaimer: This note was dictated with voice recognition software. Similar sounding words can inadvertently be transcribed and may not be corrected upon review.

## 2023-05-05 DIAGNOSIS — E785 Hyperlipidemia, unspecified: Secondary | ICD-10-CM | POA: Diagnosis not present

## 2023-05-05 DIAGNOSIS — A419 Sepsis, unspecified organism: Secondary | ICD-10-CM | POA: Diagnosis not present

## 2023-05-05 DIAGNOSIS — I69822 Dysarthria following other cerebrovascular disease: Secondary | ICD-10-CM | POA: Diagnosis not present

## 2023-05-05 DIAGNOSIS — R627 Adult failure to thrive: Secondary | ICD-10-CM | POA: Diagnosis not present

## 2023-05-05 DIAGNOSIS — K5900 Constipation, unspecified: Secondary | ICD-10-CM | POA: Diagnosis not present

## 2023-05-05 DIAGNOSIS — R52 Pain, unspecified: Secondary | ICD-10-CM | POA: Diagnosis not present

## 2023-05-05 DIAGNOSIS — I10 Essential (primary) hypertension: Secondary | ICD-10-CM | POA: Diagnosis not present

## 2023-05-05 DIAGNOSIS — R6520 Severe sepsis without septic shock: Secondary | ICD-10-CM | POA: Diagnosis not present

## 2023-05-05 DIAGNOSIS — E43 Unspecified severe protein-calorie malnutrition: Secondary | ICD-10-CM | POA: Diagnosis not present

## 2023-05-05 DIAGNOSIS — I480 Paroxysmal atrial fibrillation: Secondary | ICD-10-CM | POA: Diagnosis not present

## 2023-05-05 DIAGNOSIS — L449 Papulosquamous disorder, unspecified: Secondary | ICD-10-CM | POA: Diagnosis not present

## 2023-05-06 ENCOUNTER — Ambulatory Visit: Payer: 59 | Admitting: Family Medicine

## 2023-05-06 DIAGNOSIS — E43 Unspecified severe protein-calorie malnutrition: Secondary | ICD-10-CM | POA: Diagnosis not present

## 2023-05-06 DIAGNOSIS — I480 Paroxysmal atrial fibrillation: Secondary | ICD-10-CM | POA: Diagnosis not present

## 2023-05-06 DIAGNOSIS — R627 Adult failure to thrive: Secondary | ICD-10-CM | POA: Diagnosis not present

## 2023-05-06 DIAGNOSIS — R52 Pain, unspecified: Secondary | ICD-10-CM | POA: Diagnosis not present

## 2023-05-06 DIAGNOSIS — I69822 Dysarthria following other cerebrovascular disease: Secondary | ICD-10-CM | POA: Diagnosis not present

## 2023-05-06 DIAGNOSIS — E785 Hyperlipidemia, unspecified: Secondary | ICD-10-CM | POA: Diagnosis not present

## 2023-05-06 DIAGNOSIS — R6520 Severe sepsis without septic shock: Secondary | ICD-10-CM | POA: Diagnosis not present

## 2023-05-06 DIAGNOSIS — A419 Sepsis, unspecified organism: Secondary | ICD-10-CM | POA: Diagnosis not present

## 2023-05-06 DIAGNOSIS — L449 Papulosquamous disorder, unspecified: Secondary | ICD-10-CM | POA: Diagnosis not present

## 2023-05-06 DIAGNOSIS — I10 Essential (primary) hypertension: Secondary | ICD-10-CM | POA: Diagnosis not present

## 2023-05-06 DIAGNOSIS — K5900 Constipation, unspecified: Secondary | ICD-10-CM | POA: Diagnosis not present

## 2023-05-07 DIAGNOSIS — E43 Unspecified severe protein-calorie malnutrition: Secondary | ICD-10-CM | POA: Diagnosis not present

## 2023-05-07 DIAGNOSIS — R52 Pain, unspecified: Secondary | ICD-10-CM | POA: Diagnosis not present

## 2023-05-07 DIAGNOSIS — I69822 Dysarthria following other cerebrovascular disease: Secondary | ICD-10-CM | POA: Diagnosis not present

## 2023-05-07 DIAGNOSIS — L449 Papulosquamous disorder, unspecified: Secondary | ICD-10-CM | POA: Diagnosis not present

## 2023-05-07 DIAGNOSIS — R6520 Severe sepsis without septic shock: Secondary | ICD-10-CM | POA: Diagnosis not present

## 2023-05-07 DIAGNOSIS — I480 Paroxysmal atrial fibrillation: Secondary | ICD-10-CM | POA: Diagnosis not present

## 2023-05-07 DIAGNOSIS — K5900 Constipation, unspecified: Secondary | ICD-10-CM | POA: Diagnosis not present

## 2023-05-07 DIAGNOSIS — R627 Adult failure to thrive: Secondary | ICD-10-CM | POA: Diagnosis not present

## 2023-05-07 DIAGNOSIS — A419 Sepsis, unspecified organism: Secondary | ICD-10-CM | POA: Diagnosis not present

## 2023-05-07 DIAGNOSIS — I10 Essential (primary) hypertension: Secondary | ICD-10-CM | POA: Diagnosis not present

## 2023-05-07 DIAGNOSIS — E785 Hyperlipidemia, unspecified: Secondary | ICD-10-CM | POA: Diagnosis not present

## 2023-05-10 DIAGNOSIS — Z515 Encounter for palliative care: Secondary | ICD-10-CM | POA: Diagnosis not present

## 2023-05-10 DIAGNOSIS — K5900 Constipation, unspecified: Secondary | ICD-10-CM | POA: Diagnosis not present

## 2023-05-10 DIAGNOSIS — I69822 Dysarthria following other cerebrovascular disease: Secondary | ICD-10-CM | POA: Diagnosis not present

## 2023-05-10 DIAGNOSIS — I480 Paroxysmal atrial fibrillation: Secondary | ICD-10-CM | POA: Diagnosis not present

## 2023-05-10 DIAGNOSIS — L449 Papulosquamous disorder, unspecified: Secondary | ICD-10-CM | POA: Diagnosis not present

## 2023-05-10 DIAGNOSIS — R6520 Severe sepsis without septic shock: Secondary | ICD-10-CM | POA: Diagnosis not present

## 2023-05-10 DIAGNOSIS — E785 Hyperlipidemia, unspecified: Secondary | ICD-10-CM | POA: Diagnosis not present

## 2023-05-10 DIAGNOSIS — A419 Sepsis, unspecified organism: Secondary | ICD-10-CM | POA: Diagnosis not present

## 2023-05-10 DIAGNOSIS — I10 Essential (primary) hypertension: Secondary | ICD-10-CM | POA: Diagnosis not present

## 2023-05-10 DIAGNOSIS — E43 Unspecified severe protein-calorie malnutrition: Secondary | ICD-10-CM | POA: Diagnosis not present

## 2023-05-10 DIAGNOSIS — R627 Adult failure to thrive: Secondary | ICD-10-CM | POA: Diagnosis not present

## 2023-05-10 DIAGNOSIS — R52 Pain, unspecified: Secondary | ICD-10-CM | POA: Diagnosis not present

## 2023-05-11 DIAGNOSIS — C3432 Malignant neoplasm of lower lobe, left bronchus or lung: Secondary | ICD-10-CM | POA: Diagnosis not present

## 2023-05-11 DIAGNOSIS — D539 Nutritional anemia, unspecified: Secondary | ICD-10-CM | POA: Diagnosis not present

## 2023-05-11 DIAGNOSIS — I69822 Dysarthria following other cerebrovascular disease: Secondary | ICD-10-CM | POA: Diagnosis not present

## 2023-05-11 DIAGNOSIS — A419 Sepsis, unspecified organism: Secondary | ICD-10-CM | POA: Diagnosis not present

## 2023-05-11 DIAGNOSIS — I10 Essential (primary) hypertension: Secondary | ICD-10-CM | POA: Diagnosis not present

## 2023-05-11 DIAGNOSIS — K5900 Constipation, unspecified: Secondary | ICD-10-CM | POA: Diagnosis not present

## 2023-05-11 DIAGNOSIS — R627 Adult failure to thrive: Secondary | ICD-10-CM | POA: Diagnosis not present

## 2023-05-11 DIAGNOSIS — I480 Paroxysmal atrial fibrillation: Secondary | ICD-10-CM | POA: Diagnosis not present

## 2023-05-11 DIAGNOSIS — E785 Hyperlipidemia, unspecified: Secondary | ICD-10-CM | POA: Diagnosis not present

## 2023-05-11 DIAGNOSIS — E43 Unspecified severe protein-calorie malnutrition: Secondary | ICD-10-CM | POA: Diagnosis not present

## 2023-05-11 DIAGNOSIS — R52 Pain, unspecified: Secondary | ICD-10-CM | POA: Diagnosis not present

## 2023-05-11 DIAGNOSIS — L449 Papulosquamous disorder, unspecified: Secondary | ICD-10-CM | POA: Diagnosis not present

## 2023-05-11 DIAGNOSIS — R6520 Severe sepsis without septic shock: Secondary | ICD-10-CM | POA: Diagnosis not present

## 2023-05-12 DIAGNOSIS — L449 Papulosquamous disorder, unspecified: Secondary | ICD-10-CM | POA: Diagnosis not present

## 2023-05-12 DIAGNOSIS — K5900 Constipation, unspecified: Secondary | ICD-10-CM | POA: Diagnosis not present

## 2023-05-12 DIAGNOSIS — E785 Hyperlipidemia, unspecified: Secondary | ICD-10-CM | POA: Diagnosis not present

## 2023-05-12 DIAGNOSIS — A419 Sepsis, unspecified organism: Secondary | ICD-10-CM | POA: Diagnosis not present

## 2023-05-12 DIAGNOSIS — R627 Adult failure to thrive: Secondary | ICD-10-CM | POA: Diagnosis not present

## 2023-05-12 DIAGNOSIS — I10 Essential (primary) hypertension: Secondary | ICD-10-CM | POA: Diagnosis not present

## 2023-05-12 DIAGNOSIS — I69822 Dysarthria following other cerebrovascular disease: Secondary | ICD-10-CM | POA: Diagnosis not present

## 2023-05-12 DIAGNOSIS — R52 Pain, unspecified: Secondary | ICD-10-CM | POA: Diagnosis not present

## 2023-05-12 DIAGNOSIS — R6520 Severe sepsis without septic shock: Secondary | ICD-10-CM | POA: Diagnosis not present

## 2023-05-12 DIAGNOSIS — E43 Unspecified severe protein-calorie malnutrition: Secondary | ICD-10-CM | POA: Diagnosis not present

## 2023-05-12 DIAGNOSIS — I480 Paroxysmal atrial fibrillation: Secondary | ICD-10-CM | POA: Diagnosis not present

## 2023-05-13 ENCOUNTER — Other Ambulatory Visit: Payer: Self-pay | Admitting: *Deleted

## 2023-05-13 NOTE — Patient Outreach (Signed)
 Post-Acute Care Manager follow up. Confirmed with [facility] that Sheena [name] remains in [facility].   Collaboration with Aundra Millet, [facility]. Aundra Millet reports family meeting is scheduled for today 05/13/23. States transition plan has been for Sheena [name] to return home.  Lives alone. Aundra Millet reports palliative has not been following Sheena [name] at [facility]. Reports she can make referral upon SNF discharge. Aundra Millet states she will provide family with private duty agencies and will arrange home health upon discharge.  Writer to follow up with Aundra Millet next week to inquire about transition plans/date.   Will plan outreach to Sheena [name] to discuss complex care management services.   Raiford Noble, MSN, RN, BSN [facility], [facility] RN Post- Acute Care Manager Direct Dial: [phone]

## 2023-05-14 DIAGNOSIS — A419 Sepsis, unspecified organism: Secondary | ICD-10-CM | POA: Diagnosis not present

## 2023-05-14 DIAGNOSIS — I480 Paroxysmal atrial fibrillation: Secondary | ICD-10-CM | POA: Diagnosis not present

## 2023-05-14 DIAGNOSIS — L449 Papulosquamous disorder, unspecified: Secondary | ICD-10-CM | POA: Diagnosis not present

## 2023-05-14 DIAGNOSIS — I10 Essential (primary) hypertension: Secondary | ICD-10-CM | POA: Diagnosis not present

## 2023-05-14 DIAGNOSIS — K5900 Constipation, unspecified: Secondary | ICD-10-CM | POA: Diagnosis not present

## 2023-05-14 DIAGNOSIS — R627 Adult failure to thrive: Secondary | ICD-10-CM | POA: Diagnosis not present

## 2023-05-14 DIAGNOSIS — I69822 Dysarthria following other cerebrovascular disease: Secondary | ICD-10-CM | POA: Diagnosis not present

## 2023-05-14 DIAGNOSIS — E43 Unspecified severe protein-calorie malnutrition: Secondary | ICD-10-CM | POA: Diagnosis not present

## 2023-05-14 DIAGNOSIS — R6520 Severe sepsis without septic shock: Secondary | ICD-10-CM | POA: Diagnosis not present

## 2023-05-14 DIAGNOSIS — R52 Pain, unspecified: Secondary | ICD-10-CM | POA: Diagnosis not present

## 2023-05-14 DIAGNOSIS — E785 Hyperlipidemia, unspecified: Secondary | ICD-10-CM | POA: Diagnosis not present

## 2023-05-16 DIAGNOSIS — R6520 Severe sepsis without septic shock: Secondary | ICD-10-CM | POA: Diagnosis not present

## 2023-05-16 DIAGNOSIS — R627 Adult failure to thrive: Secondary | ICD-10-CM | POA: Diagnosis not present

## 2023-05-16 DIAGNOSIS — L449 Papulosquamous disorder, unspecified: Secondary | ICD-10-CM | POA: Diagnosis not present

## 2023-05-16 DIAGNOSIS — I480 Paroxysmal atrial fibrillation: Secondary | ICD-10-CM | POA: Diagnosis not present

## 2023-05-16 DIAGNOSIS — E43 Unspecified severe protein-calorie malnutrition: Secondary | ICD-10-CM | POA: Diagnosis not present

## 2023-05-16 DIAGNOSIS — E785 Hyperlipidemia, unspecified: Secondary | ICD-10-CM | POA: Diagnosis not present

## 2023-05-16 DIAGNOSIS — A419 Sepsis, unspecified organism: Secondary | ICD-10-CM | POA: Diagnosis not present

## 2023-05-16 DIAGNOSIS — R52 Pain, unspecified: Secondary | ICD-10-CM | POA: Diagnosis not present

## 2023-05-16 DIAGNOSIS — I69822 Dysarthria following other cerebrovascular disease: Secondary | ICD-10-CM | POA: Diagnosis not present

## 2023-05-16 DIAGNOSIS — K5900 Constipation, unspecified: Secondary | ICD-10-CM | POA: Diagnosis not present

## 2023-05-16 DIAGNOSIS — I10 Essential (primary) hypertension: Secondary | ICD-10-CM | POA: Diagnosis not present

## 2023-05-17 DIAGNOSIS — I10 Essential (primary) hypertension: Secondary | ICD-10-CM | POA: Diagnosis not present

## 2023-05-17 DIAGNOSIS — I480 Paroxysmal atrial fibrillation: Secondary | ICD-10-CM | POA: Diagnosis not present

## 2023-05-17 DIAGNOSIS — L449 Papulosquamous disorder, unspecified: Secondary | ICD-10-CM | POA: Diagnosis not present

## 2023-05-17 DIAGNOSIS — R52 Pain, unspecified: Secondary | ICD-10-CM | POA: Diagnosis not present

## 2023-05-17 DIAGNOSIS — I69822 Dysarthria following other cerebrovascular disease: Secondary | ICD-10-CM | POA: Diagnosis not present

## 2023-05-17 DIAGNOSIS — R6520 Severe sepsis without septic shock: Secondary | ICD-10-CM | POA: Diagnosis not present

## 2023-05-17 DIAGNOSIS — K5900 Constipation, unspecified: Secondary | ICD-10-CM | POA: Diagnosis not present

## 2023-05-17 DIAGNOSIS — R627 Adult failure to thrive: Secondary | ICD-10-CM | POA: Diagnosis not present

## 2023-05-17 DIAGNOSIS — E785 Hyperlipidemia, unspecified: Secondary | ICD-10-CM | POA: Diagnosis not present

## 2023-05-17 DIAGNOSIS — E43 Unspecified severe protein-calorie malnutrition: Secondary | ICD-10-CM | POA: Diagnosis not present

## 2023-05-17 DIAGNOSIS — A419 Sepsis, unspecified organism: Secondary | ICD-10-CM | POA: Diagnosis not present

## 2023-05-18 DIAGNOSIS — R6520 Severe sepsis without septic shock: Secondary | ICD-10-CM | POA: Diagnosis not present

## 2023-05-18 DIAGNOSIS — I69822 Dysarthria following other cerebrovascular disease: Secondary | ICD-10-CM | POA: Diagnosis not present

## 2023-05-18 DIAGNOSIS — I480 Paroxysmal atrial fibrillation: Secondary | ICD-10-CM | POA: Diagnosis not present

## 2023-05-18 DIAGNOSIS — S72141D Displaced intertrochanteric fracture of right femur, subsequent encounter for closed fracture with routine healing: Secondary | ICD-10-CM | POA: Diagnosis not present

## 2023-05-18 DIAGNOSIS — K5900 Constipation, unspecified: Secondary | ICD-10-CM | POA: Diagnosis not present

## 2023-05-18 DIAGNOSIS — E785 Hyperlipidemia, unspecified: Secondary | ICD-10-CM | POA: Diagnosis not present

## 2023-05-18 DIAGNOSIS — R52 Pain, unspecified: Secondary | ICD-10-CM | POA: Diagnosis not present

## 2023-05-18 DIAGNOSIS — I10 Essential (primary) hypertension: Secondary | ICD-10-CM | POA: Diagnosis not present

## 2023-05-18 DIAGNOSIS — R627 Adult failure to thrive: Secondary | ICD-10-CM | POA: Diagnosis not present

## 2023-05-18 DIAGNOSIS — E43 Unspecified severe protein-calorie malnutrition: Secondary | ICD-10-CM | POA: Diagnosis not present

## 2023-05-18 DIAGNOSIS — L449 Papulosquamous disorder, unspecified: Secondary | ICD-10-CM | POA: Diagnosis not present

## 2023-05-18 DIAGNOSIS — A419 Sepsis, unspecified organism: Secondary | ICD-10-CM | POA: Diagnosis not present

## 2023-05-19 DIAGNOSIS — I480 Paroxysmal atrial fibrillation: Secondary | ICD-10-CM | POA: Diagnosis not present

## 2023-05-19 DIAGNOSIS — E785 Hyperlipidemia, unspecified: Secondary | ICD-10-CM | POA: Diagnosis not present

## 2023-05-19 DIAGNOSIS — L449 Papulosquamous disorder, unspecified: Secondary | ICD-10-CM | POA: Diagnosis not present

## 2023-05-19 DIAGNOSIS — I69822 Dysarthria following other cerebrovascular disease: Secondary | ICD-10-CM | POA: Diagnosis not present

## 2023-05-19 DIAGNOSIS — K5900 Constipation, unspecified: Secondary | ICD-10-CM | POA: Diagnosis not present

## 2023-05-19 DIAGNOSIS — R627 Adult failure to thrive: Secondary | ICD-10-CM | POA: Diagnosis not present

## 2023-05-19 DIAGNOSIS — A419 Sepsis, unspecified organism: Secondary | ICD-10-CM | POA: Diagnosis not present

## 2023-05-19 DIAGNOSIS — R52 Pain, unspecified: Secondary | ICD-10-CM | POA: Diagnosis not present

## 2023-05-19 DIAGNOSIS — E43 Unspecified severe protein-calorie malnutrition: Secondary | ICD-10-CM | POA: Diagnosis not present

## 2023-05-19 DIAGNOSIS — R6520 Severe sepsis without septic shock: Secondary | ICD-10-CM | POA: Diagnosis not present

## 2023-05-19 DIAGNOSIS — I10 Essential (primary) hypertension: Secondary | ICD-10-CM | POA: Diagnosis not present

## 2023-05-20 ENCOUNTER — Other Ambulatory Visit: Payer: Self-pay | Admitting: *Deleted

## 2023-05-20 DIAGNOSIS — L449 Papulosquamous disorder, unspecified: Secondary | ICD-10-CM | POA: Diagnosis not present

## 2023-05-20 DIAGNOSIS — A419 Sepsis, unspecified organism: Secondary | ICD-10-CM | POA: Diagnosis not present

## 2023-05-20 DIAGNOSIS — E43 Unspecified severe protein-calorie malnutrition: Secondary | ICD-10-CM | POA: Diagnosis not present

## 2023-05-20 DIAGNOSIS — I10 Essential (primary) hypertension: Secondary | ICD-10-CM | POA: Diagnosis not present

## 2023-05-20 DIAGNOSIS — K5900 Constipation, unspecified: Secondary | ICD-10-CM | POA: Diagnosis not present

## 2023-05-20 DIAGNOSIS — R627 Adult failure to thrive: Secondary | ICD-10-CM | POA: Diagnosis not present

## 2023-05-20 DIAGNOSIS — E785 Hyperlipidemia, unspecified: Secondary | ICD-10-CM | POA: Diagnosis not present

## 2023-05-20 DIAGNOSIS — I480 Paroxysmal atrial fibrillation: Secondary | ICD-10-CM | POA: Diagnosis not present

## 2023-05-20 DIAGNOSIS — R52 Pain, unspecified: Secondary | ICD-10-CM | POA: Diagnosis not present

## 2023-05-20 DIAGNOSIS — I69822 Dysarthria following other cerebrovascular disease: Secondary | ICD-10-CM | POA: Diagnosis not present

## 2023-05-20 DIAGNOSIS — R6520 Severe sepsis without septic shock: Secondary | ICD-10-CM | POA: Diagnosis not present

## 2023-05-20 NOTE — Patient Outreach (Addendum)
 Post-Acute Care Manager follow up. Screening for potential complex care management services as benefit of health plan and primary care provider.  Secure voicemail and secure message left for and sent to Aundra Millet, [facility] social worker to inquire about transition plan details. Aundra Millet previously indicated she will have more details after care plan meeting with family.   .  Addendum: Update received from [facility] social worker. Aundra Millet reports the current transition plan is for LTC. States hospice referral will be made after skilled services have ended.   No identifiable complex care management needs at this time due to plan of long term care status at [facility].   Raiford Noble, MSN, RN, BSN [facility], [facility] RN Post- Acute Care Manager Direct Dial: [phone]

## 2023-05-21 DIAGNOSIS — A419 Sepsis, unspecified organism: Secondary | ICD-10-CM | POA: Diagnosis not present

## 2023-05-21 DIAGNOSIS — L449 Papulosquamous disorder, unspecified: Secondary | ICD-10-CM | POA: Diagnosis not present

## 2023-05-21 DIAGNOSIS — R52 Pain, unspecified: Secondary | ICD-10-CM | POA: Diagnosis not present

## 2023-05-21 DIAGNOSIS — I10 Essential (primary) hypertension: Secondary | ICD-10-CM | POA: Diagnosis not present

## 2023-05-21 DIAGNOSIS — K5900 Constipation, unspecified: Secondary | ICD-10-CM | POA: Diagnosis not present

## 2023-05-21 DIAGNOSIS — R6520 Severe sepsis without septic shock: Secondary | ICD-10-CM | POA: Diagnosis not present

## 2023-05-21 DIAGNOSIS — E43 Unspecified severe protein-calorie malnutrition: Secondary | ICD-10-CM | POA: Diagnosis not present

## 2023-05-21 DIAGNOSIS — E785 Hyperlipidemia, unspecified: Secondary | ICD-10-CM | POA: Diagnosis not present

## 2023-05-21 DIAGNOSIS — I69822 Dysarthria following other cerebrovascular disease: Secondary | ICD-10-CM | POA: Diagnosis not present

## 2023-05-21 DIAGNOSIS — I480 Paroxysmal atrial fibrillation: Secondary | ICD-10-CM | POA: Diagnosis not present

## 2023-05-21 DIAGNOSIS — R627 Adult failure to thrive: Secondary | ICD-10-CM | POA: Diagnosis not present

## 2023-05-24 ENCOUNTER — Encounter (HOSPITAL_COMMUNITY)

## 2023-05-24 DIAGNOSIS — I69822 Dysarthria following other cerebrovascular disease: Secondary | ICD-10-CM | POA: Diagnosis not present

## 2023-05-24 DIAGNOSIS — I480 Paroxysmal atrial fibrillation: Secondary | ICD-10-CM | POA: Diagnosis not present

## 2023-05-24 DIAGNOSIS — I10 Essential (primary) hypertension: Secondary | ICD-10-CM | POA: Diagnosis not present

## 2023-05-24 DIAGNOSIS — E43 Unspecified severe protein-calorie malnutrition: Secondary | ICD-10-CM | POA: Diagnosis not present

## 2023-05-24 DIAGNOSIS — R6520 Severe sepsis without septic shock: Secondary | ICD-10-CM | POA: Diagnosis not present

## 2023-05-24 DIAGNOSIS — L449 Papulosquamous disorder, unspecified: Secondary | ICD-10-CM | POA: Diagnosis not present

## 2023-05-24 DIAGNOSIS — R627 Adult failure to thrive: Secondary | ICD-10-CM | POA: Diagnosis not present

## 2023-05-24 DIAGNOSIS — E785 Hyperlipidemia, unspecified: Secondary | ICD-10-CM | POA: Diagnosis not present

## 2023-05-24 DIAGNOSIS — A419 Sepsis, unspecified organism: Secondary | ICD-10-CM | POA: Diagnosis not present

## 2023-05-24 DIAGNOSIS — R52 Pain, unspecified: Secondary | ICD-10-CM | POA: Diagnosis not present

## 2023-05-24 DIAGNOSIS — K5900 Constipation, unspecified: Secondary | ICD-10-CM | POA: Diagnosis not present

## 2023-05-25 DIAGNOSIS — I480 Paroxysmal atrial fibrillation: Secondary | ICD-10-CM | POA: Diagnosis not present

## 2023-05-25 DIAGNOSIS — E785 Hyperlipidemia, unspecified: Secondary | ICD-10-CM | POA: Diagnosis not present

## 2023-05-25 DIAGNOSIS — L449 Papulosquamous disorder, unspecified: Secondary | ICD-10-CM | POA: Diagnosis not present

## 2023-05-25 DIAGNOSIS — A419 Sepsis, unspecified organism: Secondary | ICD-10-CM | POA: Diagnosis not present

## 2023-05-25 DIAGNOSIS — E43 Unspecified severe protein-calorie malnutrition: Secondary | ICD-10-CM | POA: Diagnosis not present

## 2023-05-25 DIAGNOSIS — I69822 Dysarthria following other cerebrovascular disease: Secondary | ICD-10-CM | POA: Diagnosis not present

## 2023-05-25 DIAGNOSIS — C3432 Malignant neoplasm of lower lobe, left bronchus or lung: Secondary | ICD-10-CM | POA: Diagnosis not present

## 2023-05-25 DIAGNOSIS — R6520 Severe sepsis without septic shock: Secondary | ICD-10-CM | POA: Diagnosis not present

## 2023-05-25 DIAGNOSIS — R627 Adult failure to thrive: Secondary | ICD-10-CM | POA: Diagnosis not present

## 2023-05-25 DIAGNOSIS — I10 Essential (primary) hypertension: Secondary | ICD-10-CM | POA: Diagnosis not present

## 2023-05-25 DIAGNOSIS — K5900 Constipation, unspecified: Secondary | ICD-10-CM | POA: Diagnosis not present

## 2023-05-25 DIAGNOSIS — R52 Pain, unspecified: Secondary | ICD-10-CM | POA: Diagnosis not present

## 2023-05-31 ENCOUNTER — Telehealth: Payer: Self-pay

## 2023-05-31 NOTE — Telephone Encounter (Signed)
 Called pt's daughter to ask about her PET scan appt and pt is now with [facility].  All future appts have been cancelled

## 2023-06-02 ENCOUNTER — Ambulatory Visit: Admitting: Physician Assistant

## 2023-06-02 ENCOUNTER — Other Ambulatory Visit

## 2023-06-07 ENCOUNTER — Telehealth: Payer: Self-pay | Admitting: Medical Oncology

## 2023-06-07 NOTE — Telephone Encounter (Signed)
 Donikka confirmed pt is on hospice.

## 2023-06-22 DIAGNOSIS — J9601 Acute respiratory failure with hypoxia: Secondary | ICD-10-CM | POA: Diagnosis not present

## 2023-06-22 DIAGNOSIS — J449 Chronic obstructive pulmonary disease, unspecified: Secondary | ICD-10-CM | POA: Diagnosis not present

## 2023-06-22 DIAGNOSIS — J439 Emphysema, unspecified: Secondary | ICD-10-CM | POA: Diagnosis not present

## 2023-06-22 DIAGNOSIS — S32009D Unspecified fracture of unspecified lumbar vertebra, subsequent encounter for fracture with routine healing: Secondary | ICD-10-CM | POA: Diagnosis not present

## 2023-06-22 DIAGNOSIS — C3490 Malignant neoplasm of unspecified part of unspecified bronchus or lung: Secondary | ICD-10-CM | POA: Diagnosis not present

## 2023-07-06 ENCOUNTER — Other Ambulatory Visit: Payer: Self-pay

## 2023-07-06 DIAGNOSIS — I480 Paroxysmal atrial fibrillation: Secondary | ICD-10-CM

## 2023-07-06 MED ORDER — AMIODARONE HCL 200 MG PO TABS
200.0000 mg | ORAL_TABLET | Freq: Every day | ORAL | 1 refills | Status: AC
Start: 2023-07-06 — End: ?

## 2023-07-31 DIAGNOSIS — R110 Nausea: Secondary | ICD-10-CM | POA: Diagnosis not present

## 2023-08-10 ENCOUNTER — Encounter

## 2023-08-15 DIAGNOSIS — G8929 Other chronic pain: Secondary | ICD-10-CM | POA: Diagnosis not present

## 2023-08-24 ENCOUNTER — Encounter

## 2023-09-04 DIAGNOSIS — G8921 Chronic pain due to trauma: Secondary | ICD-10-CM | POA: Diagnosis not present

## 2023-09-21 ENCOUNTER — Encounter

## 2023-10-07 DIAGNOSIS — E43 Unspecified severe protein-calorie malnutrition: Secondary | ICD-10-CM | POA: Diagnosis not present

## 2023-10-07 DIAGNOSIS — D539 Nutritional anemia, unspecified: Secondary | ICD-10-CM | POA: Diagnosis not present

## 2023-10-07 DIAGNOSIS — L89892 Pressure ulcer of other site, stage 2: Secondary | ICD-10-CM | POA: Diagnosis not present

## 2023-10-07 DIAGNOSIS — C3432 Malignant neoplasm of lower lobe, left bronchus or lung: Secondary | ICD-10-CM | POA: Diagnosis not present

## 2023-10-12 DIAGNOSIS — D539 Nutritional anemia, unspecified: Secondary | ICD-10-CM | POA: Diagnosis not present

## 2023-10-12 DIAGNOSIS — C3432 Malignant neoplasm of lower lobe, left bronchus or lung: Secondary | ICD-10-CM | POA: Diagnosis not present

## 2023-10-12 DIAGNOSIS — E43 Unspecified severe protein-calorie malnutrition: Secondary | ICD-10-CM | POA: Diagnosis not present

## 2023-10-12 DIAGNOSIS — L89892 Pressure ulcer of other site, stage 2: Secondary | ICD-10-CM | POA: Diagnosis not present

## 2023-10-19 DIAGNOSIS — D539 Nutritional anemia, unspecified: Secondary | ICD-10-CM | POA: Diagnosis not present

## 2023-10-19 DIAGNOSIS — E43 Unspecified severe protein-calorie malnutrition: Secondary | ICD-10-CM | POA: Diagnosis not present

## 2023-10-19 DIAGNOSIS — L89892 Pressure ulcer of other site, stage 2: Secondary | ICD-10-CM | POA: Diagnosis not present

## 2023-10-19 DIAGNOSIS — C3432 Malignant neoplasm of lower lobe, left bronchus or lung: Secondary | ICD-10-CM | POA: Diagnosis not present

## 2023-10-27 DIAGNOSIS — L89892 Pressure ulcer of other site, stage 2: Secondary | ICD-10-CM | POA: Diagnosis not present

## 2023-10-27 DIAGNOSIS — D539 Nutritional anemia, unspecified: Secondary | ICD-10-CM | POA: Diagnosis not present

## 2023-10-27 DIAGNOSIS — E43 Unspecified severe protein-calorie malnutrition: Secondary | ICD-10-CM | POA: Diagnosis not present

## 2023-10-27 DIAGNOSIS — C3432 Malignant neoplasm of lower lobe, left bronchus or lung: Secondary | ICD-10-CM | POA: Diagnosis not present

## 2023-11-02 DIAGNOSIS — D539 Nutritional anemia, unspecified: Secondary | ICD-10-CM | POA: Diagnosis not present

## 2023-11-02 DIAGNOSIS — C3432 Malignant neoplasm of lower lobe, left bronchus or lung: Secondary | ICD-10-CM | POA: Diagnosis not present

## 2023-11-02 DIAGNOSIS — E43 Unspecified severe protein-calorie malnutrition: Secondary | ICD-10-CM | POA: Diagnosis not present

## 2023-11-02 DIAGNOSIS — L89892 Pressure ulcer of other site, stage 2: Secondary | ICD-10-CM | POA: Diagnosis not present

## 2024-03-21 ENCOUNTER — Ambulatory Visit: Payer: 59
